# Patient Record
Sex: Male | Born: 1950 | Race: White | Hispanic: No | Marital: Married | Smoking: Former smoker
Health system: Southern US, Community
[De-identification: ages and names within clinical notes are randomized; demographics above are authoritative.]

## PROBLEM LIST (undated history)

## (undated) DIAGNOSIS — R12 Heartburn: Secondary | ICD-10-CM

## (undated) DIAGNOSIS — G709 Myoneural disorder, unspecified: Secondary | ICD-10-CM

## (undated) DIAGNOSIS — H539 Unspecified visual disturbance: Secondary | ICD-10-CM

## (undated) DIAGNOSIS — Z87442 Personal history of urinary calculi: Secondary | ICD-10-CM

## (undated) DIAGNOSIS — H269 Unspecified cataract: Secondary | ICD-10-CM

## (undated) DIAGNOSIS — E119 Type 2 diabetes mellitus without complications: Secondary | ICD-10-CM

## (undated) DIAGNOSIS — E7800 Pure hypercholesterolemia, unspecified: Secondary | ICD-10-CM

## (undated) DIAGNOSIS — K219 Gastro-esophageal reflux disease without esophagitis: Secondary | ICD-10-CM

## (undated) DIAGNOSIS — N201 Calculus of ureter: Secondary | ICD-10-CM

## (undated) DIAGNOSIS — I4510 Unspecified right bundle-branch block: Secondary | ICD-10-CM

## (undated) DIAGNOSIS — M2170 Unequal limb length (acquired), unspecified site: Secondary | ICD-10-CM

## (undated) DIAGNOSIS — I2510 Atherosclerotic heart disease of native coronary artery without angina pectoris: Secondary | ICD-10-CM

## (undated) DIAGNOSIS — N486 Induration penis plastica: Secondary | ICD-10-CM

## (undated) DIAGNOSIS — G259 Extrapyramidal and movement disorder, unspecified: Secondary | ICD-10-CM

## (undated) DIAGNOSIS — IMO0001 Reserved for inherently not codable concepts without codable children: Secondary | ICD-10-CM

## (undated) DIAGNOSIS — N210 Calculus in bladder: Secondary | ICD-10-CM

## (undated) DIAGNOSIS — N401 Enlarged prostate with lower urinary tract symptoms: Secondary | ICD-10-CM

## (undated) DIAGNOSIS — R351 Nocturia: Secondary | ICD-10-CM

## (undated) DIAGNOSIS — E785 Hyperlipidemia, unspecified: Secondary | ICD-10-CM

## (undated) DIAGNOSIS — I10 Essential (primary) hypertension: Secondary | ICD-10-CM

## (undated) DIAGNOSIS — N200 Calculus of kidney: Secondary | ICD-10-CM

## (undated) DIAGNOSIS — G35 Multiple sclerosis: Secondary | ICD-10-CM

## (undated) DIAGNOSIS — R011 Cardiac murmur, unspecified: Secondary | ICD-10-CM

## (undated) DIAGNOSIS — Z8719 Personal history of other diseases of the digestive system: Secondary | ICD-10-CM

## (undated) DIAGNOSIS — Z86010 Personal history of colonic polyps: Secondary | ICD-10-CM

## (undated) DIAGNOSIS — L719 Rosacea, unspecified: Secondary | ICD-10-CM

## (undated) DIAGNOSIS — Z860101 Personal history of adenomatous and serrated colon polyps: Secondary | ICD-10-CM

## (undated) HISTORY — DX: Atherosclerotic heart disease of native coronary artery without angina pectoris: I25.10

## (undated) HISTORY — DX: Pure hypercholesterolemia, unspecified: E78.00

## (undated) HISTORY — DX: Extrapyramidal and movement disorder, unspecified: G25.9

## (undated) HISTORY — DX: Unspecified visual disturbance: H53.9

## (undated) HISTORY — DX: Gastro-esophageal reflux disease without esophagitis: K21.9

## (undated) HISTORY — DX: Rosacea, unspecified: L71.9

## (undated) HISTORY — DX: Heartburn: R12

## (undated) HISTORY — DX: Cardiac murmur, unspecified: R01.1

## (undated) HISTORY — DX: Induration penis plastica: N48.6

## (undated) HISTORY — DX: Hyperlipidemia, unspecified: E78.5

## (undated) HISTORY — DX: Unspecified cataract: H26.9

## (undated) HISTORY — DX: Unequal limb length (acquired), unspecified site: M21.70

## (undated) HISTORY — DX: Type 2 diabetes mellitus without complications: E11.9

## (undated) HISTORY — DX: Reserved for inherently not codable concepts without codable children: IMO0001

## (undated) HISTORY — DX: Benign prostatic hyperplasia with lower urinary tract symptoms: N40.1

## (undated) HISTORY — DX: Personal history of adenomatous and serrated colon polyps: Z86.0101

## (undated) HISTORY — PX: CARDIAC CATHETERIZATION: SHX172

## (undated) HISTORY — DX: Myoneural disorder, unspecified: G70.9

## (undated) HISTORY — DX: Essential (primary) hypertension: I10

## (undated) HISTORY — DX: Benign prostatic hyperplasia with lower urinary tract symptoms: R35.1

## (undated) HISTORY — DX: Multiple sclerosis: G35

## (undated) HISTORY — DX: Personal history of colonic polyps: Z86.010

---

## 2002-10-21 ENCOUNTER — Ambulatory Visit (HOSPITAL_COMMUNITY): Admission: RE | Admit: 2002-10-21 | Discharge: 2002-10-21 | Payer: Self-pay | Admitting: Gastroenterology

## 2002-10-21 ENCOUNTER — Encounter (INDEPENDENT_AMBULATORY_CARE_PROVIDER_SITE_OTHER): Payer: Self-pay | Admitting: Specialist

## 2004-10-17 ENCOUNTER — Ambulatory Visit (HOSPITAL_BASED_OUTPATIENT_CLINIC_OR_DEPARTMENT_OTHER): Admission: RE | Admit: 2004-10-17 | Discharge: 2004-10-17 | Payer: Self-pay | Admitting: Urology

## 2004-10-17 ENCOUNTER — Ambulatory Visit (HOSPITAL_COMMUNITY): Admission: RE | Admit: 2004-10-17 | Discharge: 2004-10-17 | Payer: Self-pay | Admitting: Urology

## 2004-10-17 HISTORY — PX: NESBIT PROCEDURE: SHX2087

## 2005-02-27 ENCOUNTER — Observation Stay (HOSPITAL_COMMUNITY): Admission: EM | Admit: 2005-02-27 | Discharge: 2005-02-28 | Payer: Self-pay | Admitting: Emergency Medicine

## 2007-07-18 DIAGNOSIS — Z87442 Personal history of urinary calculi: Secondary | ICD-10-CM

## 2007-07-18 HISTORY — DX: Personal history of urinary calculi: Z87.442

## 2007-10-02 ENCOUNTER — Encounter: Admission: RE | Admit: 2007-10-02 | Discharge: 2007-10-02 | Payer: Self-pay | Admitting: Family Medicine

## 2008-04-05 ENCOUNTER — Emergency Department (HOSPITAL_BASED_OUTPATIENT_CLINIC_OR_DEPARTMENT_OTHER): Admission: EM | Admit: 2008-04-05 | Discharge: 2008-04-05 | Payer: Self-pay | Admitting: Emergency Medicine

## 2008-04-09 ENCOUNTER — Ambulatory Visit (HOSPITAL_COMMUNITY): Admission: RE | Admit: 2008-04-09 | Discharge: 2008-04-09 | Payer: Self-pay | Admitting: Family Medicine

## 2009-02-25 ENCOUNTER — Observation Stay (HOSPITAL_COMMUNITY): Admission: EM | Admit: 2009-02-25 | Discharge: 2009-02-26 | Payer: Self-pay | Admitting: Emergency Medicine

## 2009-05-04 ENCOUNTER — Encounter: Admission: RE | Admit: 2009-05-04 | Discharge: 2009-05-04 | Payer: Self-pay | Admitting: Diagnostic Neuroimaging

## 2009-05-05 ENCOUNTER — Encounter: Admission: RE | Admit: 2009-05-05 | Discharge: 2009-07-14 | Payer: Self-pay | Admitting: Diagnostic Neuroimaging

## 2009-05-15 ENCOUNTER — Encounter: Admission: RE | Admit: 2009-05-15 | Discharge: 2009-05-15 | Payer: Self-pay | Admitting: Diagnostic Neuroimaging

## 2009-10-01 DIAGNOSIS — E1169 Type 2 diabetes mellitus with other specified complication: Secondary | ICD-10-CM

## 2009-10-01 DIAGNOSIS — E119 Type 2 diabetes mellitus without complications: Secondary | ICD-10-CM

## 2009-10-01 DIAGNOSIS — E785 Hyperlipidemia, unspecified: Secondary | ICD-10-CM

## 2009-10-01 DIAGNOSIS — I10 Essential (primary) hypertension: Secondary | ICD-10-CM

## 2009-10-01 DIAGNOSIS — E1165 Type 2 diabetes mellitus with hyperglycemia: Secondary | ICD-10-CM | POA: Insufficient documentation

## 2009-10-06 ENCOUNTER — Ambulatory Visit: Payer: Self-pay | Admitting: Cardiology

## 2009-10-06 DIAGNOSIS — I2510 Atherosclerotic heart disease of native coronary artery without angina pectoris: Secondary | ICD-10-CM | POA: Insufficient documentation

## 2009-10-06 DIAGNOSIS — I25118 Atherosclerotic heart disease of native coronary artery with other forms of angina pectoris: Secondary | ICD-10-CM | POA: Insufficient documentation

## 2010-04-06 ENCOUNTER — Ambulatory Visit: Payer: Self-pay | Admitting: Diagnostic Radiology

## 2010-04-06 ENCOUNTER — Encounter: Payer: Self-pay | Admitting: Cardiology

## 2010-04-06 ENCOUNTER — Emergency Department (HOSPITAL_BASED_OUTPATIENT_CLINIC_OR_DEPARTMENT_OTHER): Admission: EM | Admit: 2010-04-06 | Discharge: 2010-04-06 | Payer: Self-pay | Admitting: Emergency Medicine

## 2010-08-06 ENCOUNTER — Encounter: Payer: Self-pay | Admitting: Urology

## 2010-08-18 NOTE — Assessment & Plan Note (Signed)
Summary: [facility]   Visit Type:  Follow-up Primary Provider:  Dr. Vernon Prey  CC:  CAD.  History of Present Illness: The patient presents as a new patient. He has a history of multiple cardiovascular risk factors. He also has an abnormal EKG with right bundle branch block. He has had 2 catheterizations in the past. I don't see these reports but do see the results mentioned in one note. In 2010 August he had 30% LAD stenosis and 50% distal right coronary stenosis diagnosed.  He presents now changing cardiovascular groups. Most recently he has been diagnosed with multiple sclerosis. This limits him slightly. 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 With this activity he denies chest pressure, neck or arm discomfort. He has no palpitations, presyncope or syncope. He has no PND or orthopnea.  Current Medications (verified): 1)  Metformin Hcl 500 Mg Tabs (Metformin Hcl) .... 2 By Mouth Two Times A Day 2)  Actos 30 Mg Tabs (Pioglitazone Hcl) .Marland Kitchen.. 1 By Mouth Daily 3)  Isosorbide Mononitrate Cr 30 Mg Xr24h-Tab (Isosorbide Mononitrate) .Marland Kitchen.. 1 By Mouth Daily 4)  Amlodipine Besylate 2.5 Mg Tabs (Amlodipine Besylate) .Marland Kitchen.. 1 Podaily 5)  Lovaza 1 Gm Caps (Omega-3-Acid Ethyl Esters) .... 2 By Mouth Two Times A Day 6)  Lisinopril 10 Mg Tabs (Lisinopril) .Marland Kitchen.. 1 By Mouth Daily 7)  Crestor 5 Mg Tabs (Rosuvastatin Calcium) .Marland Kitchen.. 1 By Mouth Daily 8)  Prevacid 15 Mg Cpdr (Lansoprazole) .Marland Kitchen.. 1 By Mouth Daily 9)  Vitamin D3 1000 Unit Caps (Cholecalciferol) .Marland Kitchen.. 1 By Mouth Daily 10)  Aspirin 81 Mg  Tabs (Aspirin) .Marland Kitchen.. 1 By Mouth Daily 11)  Betaseron 0.3 Mg Solr (Interferon Beta-1b) .... Every Other Day  Allergies (verified): No Known Drug Allergies  Past History:  Past Medical History: Hypertension.   Non-insulin-dependent diabetes.  Dyslipidemia  Gastroesophageal reflux symptoms.  Multiple sclerosis      Past Surgical History: Nesbit tuck correction of penile angulation.  Review of  Systems       Back pain. Otherwise as stated in the history of present illness negative for all other systems.  Vital Signs:  Patient profile:   60 year old male Height:      66 inches Weight:      161 pounds BMI:     26.08 Pulse rate:   68 / minute Resp:     16 per minute BP sitting:   120 / 82  (right arm)  Vitals Entered By: Marrion Coy, CNA (October 06, 2009 3:00 PM)  Physical Exam  General:  Well developed, well nourished, in no acute distress. Head:  normocephalic and atraumatic Eyes:  PERRLA/EOM intact; conjunctiva and lids normal. Mouth:  Teeth, gums and palate normal. Oral mucosa normal. Neck:  Neck supple, no JVD. No masses, thyromegaly or abnormal cervical nodes. Chest Wall:  no deformities or breast masses noted Lungs:  Clear bilaterally to auscultation and percussion. Abdomen:  Bowel sounds positive; abdomen soft and non-tender without masses, organomegaly, or hernias noted. No hepatosplenomegaly. Msk:  Back normal, normal gait. Muscle strength and tone normal. Extremities:  No clubbing or cyanosis. Neurologic:  Alert and oriented x 3. Skin:  Intact without lesions or rashes. Cervical Nodes:  no significant adenopathy Axillary Nodes:  no significant adenopathy Inguinal Nodes:  no significant adenopathy Psych:  Normal affect.   Detailed Cardiovascular Exam  Neck    Carotids: Carotids full and equal bilaterally without bruits.      Neck Veins: Normal, no JVD.  Heart    Inspection: no deformities or lifts noted.      Palpation: normal PMI with no thrills palpable.      Auscultation: regular rate and rhythm, S1, S2 without murmurs, rubs, gallops, or clicks.    Vascular    Abdominal Aorta: no palpable masses, pulsations, or audible bruits.      Femoral Pulses: normal femoral pulses bilaterally.      Pedal Pulses: normal pedal pulses bilaterally.      Radial Pulses: normal radial pulses bilaterally.      Peripheral Circulation: no clubbing, cyanosis, or  edema noted with normal capillary refill.     EKG  Procedure date:  10/06/2009  Findings:      sinus rhythm, rate 68, right bundle branch block, no acute ST-T wave changes  Impression & Recommendations:  Problem # 1:  CORONARY ATHEROSCLEROSIS, NATIVE VESSEL (ICD-414.01) The patient has nonobstructive disease and no acute symptoms. At this time he will continue to participate in aggressive risk reduction.  I do plan on seeing him again probably in about 24 months or sooner if he has any symptoms. I would do periodic treadmill testing given his risk factors. Orders: EKG w/ Interpretation (93000)  Problem # 2:  DM (ICD-250.00) I reviewed his labs. His most recent hemoglobin A1c in February was 6.1. This will be followed by Dr. Christell Constant.  Problem # 3:  HYPERTENSION (ICD-401.9) His blood pressure is well controlled and he will continue the meds as listed.  Problem # 4:  DYSLIPIDEMIA (ICD-272.4)  His last lipid profile demonstrated LDL 82, HDL 37. He was to increase his Crestor and this is being followed closely by Dr.Moore  Patient Instructions: 1)  Your physician recommends that you schedule a follow-up appointment in: 24 months with Dr Antoine Poche in [place] 2)  Your physician recommends that you continue on your current medications as directed. Please refer to the Current Medication list given to you today.

## 2010-08-25 ENCOUNTER — Telehealth (INDEPENDENT_AMBULATORY_CARE_PROVIDER_SITE_OTHER): Payer: Self-pay | Admitting: *Deleted

## 2010-08-30 ENCOUNTER — Ambulatory Visit (HOSPITAL_COMMUNITY): Admission: RE | Admit: 2010-08-30 | Payer: 59 | Source: Ambulatory Visit

## 2010-09-01 NOTE — Progress Notes (Signed)
  12 lead faxed to Karen/WL @ [phone] [facility]  August 25, 2010 3:07 PM

## 2010-09-05 ENCOUNTER — Ambulatory Visit (HOSPITAL_COMMUNITY): Payer: 59

## 2010-09-05 ENCOUNTER — Ambulatory Visit (HOSPITAL_COMMUNITY)
Admission: RE | Admit: 2010-09-05 | Discharge: 2010-09-05 | Disposition: A | Discharge status: Home / Self Care | Payer: 59 | Source: Ambulatory Visit | Attending: Urology | Admitting: Urology

## 2010-09-05 DIAGNOSIS — E119 Type 2 diabetes mellitus without complications: Secondary | ICD-10-CM | POA: Insufficient documentation

## 2010-09-05 DIAGNOSIS — I10 Essential (primary) hypertension: Secondary | ICD-10-CM | POA: Insufficient documentation

## 2010-09-05 DIAGNOSIS — Z7982 Long term (current) use of aspirin: Secondary | ICD-10-CM | POA: Insufficient documentation

## 2010-09-05 DIAGNOSIS — G35 Multiple sclerosis: Secondary | ICD-10-CM | POA: Insufficient documentation

## 2010-09-05 DIAGNOSIS — K449 Diaphragmatic hernia without obstruction or gangrene: Secondary | ICD-10-CM | POA: Insufficient documentation

## 2010-09-05 DIAGNOSIS — N200 Calculus of kidney: Secondary | ICD-10-CM | POA: Insufficient documentation

## 2010-09-05 DIAGNOSIS — Z79899 Other long term (current) drug therapy: Secondary | ICD-10-CM | POA: Insufficient documentation

## 2010-09-05 HISTORY — PX: EXTRACORPOREAL SHOCK WAVE LITHOTRIPSY: SHX1557

## 2010-09-05 LAB — GLUCOSE, CAPILLARY
Glucose-Capillary: 118 mg/dL — ABNORMAL HIGH (ref 70–99)
Glucose-Capillary: 86 mg/dL (ref 70–99)

## 2010-09-29 LAB — URINALYSIS, ROUTINE W REFLEX MICROSCOPIC
Hgb urine dipstick: NEGATIVE
Leukocytes, UA: NEGATIVE
Protein, ur: 30 mg/dL — AB
Specific Gravity, Urine: 1.026 (ref 1.005–1.030)
pH: 5 (ref 5.0–8.0)

## 2010-09-29 LAB — COMPREHENSIVE METABOLIC PANEL
CO2: 24 mEq/L (ref 19–32)
GFR calc Af Amer: >60 mL/min (ref >60)
Potassium: 3.7 mEq/L (ref 3.5–5.1)
Sodium: 139 mEq/L (ref 135–145)
Total Bilirubin: 0.9 mg/dL (ref 0.3–1.2)
Total Protein: 7.5 g/dL (ref 6.0–8.3)

## 2010-09-29 LAB — URINE CULTURE
Culture  Setup Time: 201109220040
Culture: NO GROWTH

## 2010-09-29 LAB — DIFFERENTIAL
Eosinophils Relative: 0 % (ref 0–5)
Lymphocytes Relative: 4 % — ABNORMAL LOW (ref 12–46)
Lymphs Abs: 0.3 10*3/uL — ABNORMAL LOW (ref 0.7–4.0)
Monocytes Absolute: 0.9 10*3/uL (ref 0.1–1.0)
Monocytes Relative: 10 % (ref 3–12)
Neutro Abs: 7.7 10*3/uL (ref 1.7–7.7)
Neutrophils Relative %: 86 % — ABNORMAL HIGH (ref 43–77)

## 2010-09-29 LAB — CBC
Hemoglobin: 12.3 g/dL — ABNORMAL LOW (ref 13.0–17.0)
MCHC: 34.5 g/dL (ref 30.0–36.0)
MCV: 85 fL (ref 78.0–100.0)
WBC: 8.9 10*3/uL (ref 4.0–10.5)

## 2010-09-29 LAB — LIPASE, BLOOD: Lipase: 60 U/L (ref 23–300)

## 2010-09-29 LAB — URINE MICROSCOPIC-ADD ON

## 2010-10-22 LAB — TROPONIN I
Troponin I: 0.01 ng/mL (ref 0.00–0.06)
Troponin I: 0.01 ng/mL (ref 0.00–0.06)
Troponin I: <0.01 ng/mL (ref 0.00–0.06)

## 2010-10-22 LAB — DIFFERENTIAL
Basophils Relative: 0 % (ref 0–1)
Eosinophils Absolute: 0.1 10*3/uL (ref 0.0–0.7)
Eosinophils Relative: 1 % (ref 0–5)
Lymphocytes Relative: 20 % (ref 12–46)
Monocytes Absolute: 0.6 10*3/uL (ref 0.1–1.0)
Monocytes Relative: 8 % (ref 3–12)
Neutro Abs: 5.2 10*3/uL (ref 1.7–7.7)
Neutrophils Relative %: 71 % (ref 43–77)

## 2010-10-22 LAB — COMPREHENSIVE METABOLIC PANEL
ALT: 17 U/L (ref 0–53)
Calcium: 9.4 mg/dL (ref 8.4–10.5)
Creatinine, Ser: 0.86 mg/dL (ref 0.4–1.5)
GFR calc non Af Amer: >60 mL/min (ref >60)
Total Bilirubin: 0.8 mg/dL (ref 0.3–1.2)

## 2010-10-22 LAB — CBC
HCT: 38 % — ABNORMAL LOW (ref 39.0–52.0)
HCT: 38.9 % — ABNORMAL LOW (ref 39.0–52.0)
MCHC: 34.8 g/dL (ref 30.0–36.0)
MCV: 87.3 fL (ref 78.0–100.0)
MCV: 87.9 fL (ref 78.0–100.0)
Platelets: 193 10*3/uL (ref 150–400)
Platelets: 202 10*3/uL (ref 150–400)
RBC: 4.42 MIL/uL (ref 4.22–5.81)
RDW: 14.7 % (ref 11.5–15.5)
WBC: 7.3 10*3/uL (ref 4.0–10.5)

## 2010-10-22 LAB — APTT: aPTT: 30 seconds (ref 24–37)

## 2010-10-22 LAB — CK TOTAL AND CKMB (NOT AT ARMC)
CK, MB: 3.1 ng/mL (ref 0.3–4.0)
Relative Index: 3.3 — ABNORMAL HIGH (ref 0.0–2.5)
Relative Index: INVALID (ref 0.0–2.5)
Total CK: 112 U/L (ref 7–232)
Total CK: 84 U/L (ref 7–232)

## 2010-10-22 LAB — BASIC METABOLIC PANEL
Calcium: 8.8 mg/dL (ref 8.4–10.5)
Creatinine, Ser: 1.01 mg/dL (ref 0.4–1.5)
GFR calc Af Amer: >60 mL/min (ref >60)
Sodium: 135 mEq/L (ref 135–145)

## 2010-10-22 LAB — PROTIME-INR
INR: 1 (ref 0.00–1.49)
Prothrombin Time: 13.1 seconds (ref 11.6–15.2)

## 2010-10-22 LAB — LIPID PANEL: VLDL: 22 mg/dL (ref 0–40)

## 2010-10-22 LAB — BRAIN NATRIURETIC PEPTIDE: Pro B Natriuretic peptide (BNP): <30 pg/mL (ref 0.0–100.0)

## 2010-10-22 LAB — MAGNESIUM: Magnesium: 2.3 mg/dL (ref 1.5–2.5)

## 2010-10-22 LAB — HEMOGLOBIN A1C: Mean Plasma Glucose: 151 mg/dL

## 2010-10-22 LAB — HEPARIN LEVEL (UNFRACTIONATED): Heparin Unfractionated: 0.41 IU/mL (ref 0.30–0.70)

## 2010-11-15 ENCOUNTER — Encounter: Payer: Self-pay | Admitting: Family Medicine

## 2010-11-29 NOTE — Cardiovascular Report (Signed)
NAME:  Jacob Fowler, MCGRANAHAN NO.:  [id number]   MEDICAL RECORD NO.:  [id number]          PATIENT TYPE:  INP   LOCATION:  2036                         FACILITY:  [facility]   PHYSICIAN:  Antonieta Iba, MD   DATE OF BIRTH:  08/01/1950   DATE OF PROCEDURE:  DATE OF DISCHARGE:  02/26/2009                            CARDIAC CATHETERIZATION   Cardiac Catheterization   PHYSICIAN PERFORMING THE PROCEDURE:  Antonieta Iba, MD   IDENTIFICATION:  Mr. Crate is a very pleasant 60 year old gentleman  with a history of mild coronary artery disease noted on catheterization  in August 2006 who presents with chest pain with exertion.  He also has  a history of hyperlipidemia, diabetes, intolerance to high dose statins  and treated hypertension.  He presents to the Cardiac Catheterization  Lab for further evaluation.   The risks and the benefits of the procedure were discussed with the  patient and consent was obtained.  He was brought to the Cardiac  Catheterization Lab and prepped and draped in the usual sterile fashion.  The modified Seldinger technique was used to engage the right femoral  artery and a 5-French introducer sheath was placed.  A 5-French JL-4 and  a JR-4 catheter were used to engage the left main and the ostium of the  RCA respectively.  Hand injection contrast with cinematography recorded  the coronary anatomy.  A 5-French pigtail catheter was used to cross the  aortic valve and LV gram was recorded.  At the end of the case, the  catheters were removed and manual pressure held and hemostasis obtained.  No complications were reported at the time of this dictation.   Coronary anatomy;  1. Left main; left main is a moderate-to-large size vessel that      bifurcates into the LAD and the left circumflex.  There is no      significant disease noted.   1. Left anterior descending; the LAD is a moderate-to-large size      vessel that tapers mildly from the mid LAD  distally to the apex.      There is a 30% to 40% lesion noted in the mid LAD that appears to      be eccentric near the D2 take off.  There is also mild diffuse      disease from the mid to distal LAD estimated at 20%.   1. Left circumflex; left circumflex is a moderate-sized vessel.  There      is one large obtuse marginal branch.  There is 20% disease noted in      the mid OM 1.  Otherwise, no disease noted.   1. Right coronary artery; right dominant coronary arterial system with      moderate distal RCA disease noted prior to the bifurcation of the      PD and PL branches.  Other small regions of mild diffuse disease.   LV gram showing normal systolic function with no focal wall motion  abnormalities.   In summary; right dominant coronary arterial system with moderate  disease of the distal RCA, mild to moderate disease  of the mid-LAD and  mild diffuse disease of the distal LAD and a region in the mid OM.  Medical management will be recommended.  He will be discharged today if  there are no complications.      Antonieta Iba, MD  Electronically Signed     TJG/MEDQ  D:  02/26/2009  T:  02/27/2009  Job:  [id number]

## 2010-11-29 NOTE — Discharge Summary (Signed)
NAME:  Jacob Fowler, Jacob Fowler NO.:  [id number]   MEDICAL RECORD NO.:  [id number]          PATIENT TYPE:  INP   LOCATION:  2036                         FACILITY:  [facility]   PHYSICIAN:  Nanetta Batty, M.D.   DATE OF BIRTH:  May 17, 1951   DATE OF ADMISSION:  02/25/2009  DATE OF DISCHARGE:  02/26/2009                               DISCHARGE SUMMARY   DISCHARGE DIAGNOSES:  1. Chest pain worrisome for unstable angina, catheterization this      admission showing mild coronary disease with a 50% distal right      coronary artery.  2. Normal left ventricular function.  3. Reported history of coronary spasm in 2004.  4. Treated hypertension.  5. Treated dyslipidemia.  6. Type 2 non-insulin-dependent diabetes.  7. Family history of coronary disease.  8. Right bundle-branch block which is chronic.   HOSPITAL COURSE:  The patient is a 60 year old male followed by Dr.  Christell Constant, he had been seen by Dr. Elsie Lincoln in the past.  In a catheterization  in 2004, it was felt that he had coronary spasm, I do not have the  details of that cath report.  He was restudied again in August 2006 at  the [facility] and had mild disease, but he did have an anterior wall  motion abnormality with normal LV function.  His last stress test was in  May 2009 and was negative for significant ischemia with an EF of 60%.  He saw Dr. Elsie Lincoln in April 2010 and was doing well.  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  He had mentioned to Dr. Christell Constant in the  office on February 25, 2009, that he had also been having some chest pain.  Because of his multiple risk factors, Dr. Christell Constant sent him over to [facility]  for further evaluation.  He was seen by Dr. Allyson Sabal and myself in the  emergency room and admitted for diagnostic catheterization.  We put him  on IV heparin and nitroglycerin.  Enzymes were negative.  Catheterization done by Dr. Julien Nordmann on  February 26, 2009, shows  normal left main, 30% LAD 20% distal circumflex, and 50% distal RCA with  an EF of greater than 55%.  Plan is for continued medical management.  We feel he can be discharged later on February 26, 2009, after his bedrest  is up and he has ambulated without problems.   LABORATORY DATA:  Sodium 135, potassium 3.5, BUN 18, creatinine 1.0.  Troponins are negative.  Triglycerides 108, total cholesterol 153, LDL  109, HDL 22.  White count 6.6, hemoglobin 13.2, hematocrit 38, platelets  193.  Hemoglobin A1c is 6.9.  EKG shows sinus rhythm, sinus bradycardia  with right bundle-branch block.   DISPOSITION:  The patient is discharged in stable condition.   DISCHARGE MEDICATIONS:  1. Norvasc 2.5 mg a day.  2. Simcor 500/20 daily.  3. Aspirin 81 mg a day.  4. Lisinopril 10 mg a day.  5. Metformin 1 g b.i.d. will be held until March 01, 2009.  6. Prilosec 20 mg a day.  7. Lovaza 2 g b.i.d.  8. Actos 30 mg a day.   DISPOSITION:  The patient is discharged in stable condition.  He will  follow up with Dr. Christell Constant and Dr. Allyson Sabal.      Abelino Derrick, P.A.      Nanetta Batty, M.D.  Electronically Signed    LKK/MEDQ  D:  02/26/2009  T:  02/27/2009  Job:  [id number]   cc:   Ernestina Penna, M.D.

## 2010-11-29 NOTE — H&P (Signed)
NAME:  Jacob Fowler, Jacob Fowler NO.:  [id number]   MEDICAL RECORD NO.:  [id number]          PATIENT TYPE:  EMS   LOCATION:  MAJO                         FACILITY:  [facility]   PHYSICIAN:  Nanetta Batty, M.D.   DATE OF BIRTH:  07/20/1950   DATE OF ADMISSION:  02/25/2009  DATE OF DISCHARGE:                              HISTORY & PHYSICAL   CHIEF COMPLAINT:  Chest pain.   HISTORY OF PRESENT ILLNESS:  The patient is a 60 year old male who has  been followed by Dr. Elsie Lincoln in the past.  He had a catheterization in  2004 and was felt to have had coronary spasm.  He was studied again in  August 2006 at the [facility].  At that time he had a 40% mid LAD,  normal circumflex and normal RCA with an EF of 50%.  His last stress  test was in May 2009 and was negative for ischemia with an EF of 66%.  He last saw Dr. Elsie Lincoln in April 2010 and was doing well from a cardiac  standpoint.  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  He was at Dr. Roe Coombs Moore's office today.  Dr. Christell Constant also  elicited a history of recent chest pain over the last couple of days.  He said yesterday he had a spell the lasted 2 hours with some radiation  down his arm.  He did not take nitroglycerin.  There was no associated  nausea, vomiting, diaphoresis, or shortness of breath.  He continues to  have some low level, 2-3/10 chest pain in Dr. Kathi Der office and saw Dr.  Christell Constant sent him to the emergency room.  He denies any unusual dyspnea on  exertion.  He is admitted now for further evaluation.   PAST MEDICAL HISTORY:  Remarkable for treated hypertension.  He has type  2 non-insulin-dependent diabetes.  He has a history of dyslipidemia and  has been intolerant to statins in the past.  He has some  gastroesophageal reflux symptoms.   CURRENT MEDICATIONS:  1. Norvasc 2.5 mg a day.  2. Simcor 500/20 daily.  3. Aspirin 81 mg  a day.  4. Lisinopril 10 mg a day.  5. Metformin 1 gram b.i.d.  6. Actos 30 mg a day.  7. Lovaza 2 grams b.i.d.  8. Prilosec 20 mg a day.   ALLERGIES:  He has no known drug allergies.   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  He is married.  He has 2 children.  He quit smoking when he was in high school.   FAMILY HISTORY:  Remarkable in that he has a brother who had a heart  attack at 48 years old.  His mother also had coronary disease.   REVIEW OF SYSTEMS:  Essentially unremarkable except as noted above.  He  denies any GI bleeding or melena.  He has had a history of kidney  stones.  He has not had any prostate trouble.  Denies any recent fever  or productive cough.   PHYSICAL EXAMINATION:  VITAL SIGNS:  Blood pressure 116/80, pulse 56,  respirations 12.  GENERAL:  He is a well-developed, well-nourished male in no acute  distress.  HEENT:  Normocephalic.  Extraocular movements are intact.  Sclerae is  anicteric.  Lids and conjunctivae are within normal limits.  He does  wear glasses.  NECK:  Without JVD or bruits.  CHEST:  Clear to auscultation and percussion.  CARDIAC:  Reveals regular rate and rhythm without murmur or gallop.  Normal S1 and S2.  ABDOMEN:  Nontender.  No hepatosplenomegaly.  Bowel sounds are present.  EXTREMITIES:  Without edema.  Distal pulses are 3+/4 bilaterally.  NEURO:  Exam grossly intact.  He is awake, alert, oriented, cooperative.  Moves all extremities without obvious deficit.  SKIN:  Cool and dry.   EKG shows sinus rhythm with a right bundle branch block which is old.   IMPRESSION:  1. Chest pain, normal coronaries with spasm in 2004, mild coronary      disease in 2006 with a 40% left anterior descending artery,      negative Myoview May 2009.  2. Type 2 non-insulin-dependent diabetes.  3. Treated hypertension.  4. Treated dyslipidemia with a history of intolerance to higher-dose      statins.  5. Strong family history of  coronary disease.  6. Right bundle branch block which is chronic.   PLAN:  The patient is seen by Dr. Allyson Sabal in the emergency room.  He will  be admitted to telemetry.  We will start him on IV heparin and nitrates  and plan restudy probably tomorrow.      Abelino Derrick, P.A.      Nanetta Batty, M.D.  Electronically Signed    LKK/MEDQ  D:  02/25/2009  T:  02/25/2009  Job:  [id number]   cc:   Nanetta Batty, M.D.  Ernestina Penna, M.D.

## 2010-12-02 NOTE — Op Note (Signed)
NAME:  Jacob Fowler, Jacob Fowler            ACCOUNT NO.:  [id number]   MEDICAL RECORD NO.:  [id number]          PATIENT TYPE:  AMB   LOCATION:  NESC                         [facility]ACILITY[facility]  [facility]   PHYSICIAN:  Mark C. Vernie Ammons, M.D.  DATE OF BIRTH:  1950/12/09   DATE OF PROCEDURE:  10/17/2004  DATE OF DISCHARGE:                                 OPERATIVE REPORT   PREOPERATIVE DIAGNOSIS:  Peyronie's disease.   POSTOPERATIVE DIAGNOSES:  Peyronie's disease.   PROCEDURE:  Nesbit tuck correction of penile angulation.   SURGEON:  Mark C. Vernie Ammons, M.D.   ANESTHESIA:  General.   BLOOD LOSS:  Less than 5 cc.   SPECIMENS:  None.   COMPLICATIONS:  None.   INDICATIONS:  The patient is 60 year old white male with a Peyronie's  disease with a penile curvature.  The condition has remained stable now and  he has elected for surgical correction. We discussed the treatment options  and he has elected to proceed with Nesbitt tuck understanding the risks,  complications and alternatives.   DESCRIPTION OF OPERATION:  After informed consent, the patient was brought  to the major OR, placed on the table and administered general anesthesia.  The genitalia were sterilely prepped and draped. The foreskin was retracted  and circumcising incision was then made circumferentially approximately 3 mm  from the coronal sulcus. The penile shaft skin was then dissected back down  to the base off of the Buck's fascia. A Penrose drain was then placed around  the base of penis to broke to form a penile tourniquet and sterile  injectable saline was then injected into the corpora to perform an  artificial erection. This revealed curvature of the penis dorsally and to  the left. I then marked the area of greatest angulation and the area  contralateral to that with a marking pen. The erection was released and I  placed Allis clamps in the location of the planned Nesbitt tucks and  repeated the artificial erection. This resulted  in good straightening of the  penis with minimal no foreshortening identifiable. I therefore released the  tourniquet again, made a longitudinal incision at the location of the Allis  clamp and then closed him in a horizontal fashion with interrupted 3-0  braided nylon suture. Three of these incisions and closures were made by  incising the corpora just to the level of the spongiosal tissue but not into  this tissue. I then closed each of these in a watertight fashion. The  tourniquet was then reapplied and artificial erection and now showed that  there was near complete straightening of the penis. There was some wasting  of the penis prior to the tuck procedure. This area of wasting was not  increased by procedure. I then closed Buck's fascia over the area where the  corporotomies were made and made sure all bleeding points were cauterized. I  then closed the frenulum in the midline with interrupted 4-0 chromic suture.  The shaft skin was then reapproximated to the proximal penile skin  circumferentially with 3-0 chromic suture. Neosporin was then applied to the  incision and because  he was uncircumcised I repositioned the foreskin over  the incision rather than apply a dressing to prevent paraphimosis.   A dorsal penile block was performed using 10 cc of 0.25% plain Marcaine in  the standard fashion. The patient was awakened and taken to recovery in  stable satisfactory condition. He tolerated procedure well with no  intraoperative complications. He will be given a prescription for 48 Tylox  and Keflex 500 milligrams to be taken b.i.d. for 10 days and will return to  my office for follow-up in two weeks.      MCO/MEDQ  D:  10/17/2004  T:  10/17/2004  Job:  [id number]

## 2010-12-02 NOTE — Op Note (Signed)
NAME:  Jacob Fowler, Jacob Fowler                      ACCOUNT NO.:  [id number]   MEDICAL RECORD NO.:  [id number]                   PATIENT TYPE:  AMB   LOCATION:  ENDO                                 [facility]ACILITY[facility]  [facility]   PHYSICIAN:  Petra Kuba, M.D.                 DATE OF BIRTH:  01/20/1951   DATE OF PROCEDURE:  10/21/2002  DATE OF DISCHARGE:                                 OPERATIVE REPORT   PROCEDURE:  Colonoscopy.   INDICATIONS FOR PROCEDURE:  Screening.   Consent was signed after risks, benefits, methods, and options were  thoroughly discussed in the office.   MEDICINES USED:  Demerol 50, Versed 5.   DESCRIPTION OF PROCEDURE:  Rectal inspection was pertinent for external  hemorrhoids, small. Digital exam was negative. The video colonoscope was  inserted, easily advanced around the colon to the cecum. This did require  some abdominal pressure but no position changes. On insertion, a small  proximal transverse polyp was seen but no other abnormalities. The cecum was  identified by the appendiceal orifice and the ileocecal valve.  The prep was  adequate. There was some liquid stool that required washing and suctioning.  On slow withdrawal through the colon, the cecum and the ascending were  normal. As the scope was withdrawn around the hepatic flexure, the polyp  seen on insertion was seen, snared, electrocautery applied, and the polyp  was suctioned through the scope and collected in the trap. Possibly there  was a patch of the residual polypoid tissue which was hot biopsied x1. The  scope was further withdrawn. In the mid transverse, a tiny polyp was seen  and was hot biopsied x1. The scope was further withdrawn. Other than another  tiny polyp in the distal sigmoid which was hot biopsied and put in the same  container, no other abnormalities were seen as we slowly withdrew back to  the rectum. Once back in the rectum, the scope was retroflexed pertinent for  some internal  hemorrhoids.  The scope was straightened and readvanced a  short ways up the left side of the colon, air was suctioned, scope removed.  The patient tolerated the procedure well. There was no obvious or immediate  complications.   ENDOSCOPIC DIAGNOSIS:  1. Internal and external small hemorrhoids.  2. Distal sigmoid tiny polyp hot biopsied.  3. Transverse two small polyps, one hot biopsied, the other snared and hot     biopsied at the base.  4. Otherwise within normal limits to the cecum.    PLAN:  Await pathology to determine future colonic screening. Continue  workup with an EGD for his upper tract symptoms.                                               Vernia Buff  E. Magod, M.D.    MEM/MEDQ  D:  10/21/2002  T:  10/21/2002  Job:  [id number]   cc:   Ernestina Penna, M.D.  [address] [place]  [place] [address]  Fax: [phone]

## 2010-12-02 NOTE — Discharge Summary (Signed)
NAME:  Jacob Fowler, Jacob Fowler NO.:  [id number]   MEDICAL RECORD NO.:  [id number]          PATIENT TYPE:  INP   LOCATION:  6524                         FACILITY:  [facility]   PHYSICIAN:  Nanetta Batty, M.D.   DATE OF BIRTH:  [birth year]   DATE OF ADMISSION:  02/27/2005  DATE OF DISCHARGE:  02/28/2005                                 DISCHARGE SUMMARY   [name] is a 60 year old white married male patient who has a history  of coronary spasm, with normal coronaries per cath in 2004.  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  He took a nitroglycerin and felt better.  He slept in his truck  and then was awakened with back pain between his scapula, lasted for a  while, sharp and stabbing, eventually resolved and then he developed chest  discomfort and left arm pain.  He repeated a nitroglycerin with relief and  went to his primary MD.  It was discussed with Dr. Allyson Sabal and he was brought  to [facility].  He was seen by Dr. Caprice Kluver, it was decided to  admit him, get CK-MBs and troponins, on the morning on February 28, 2005 he  was again seen by Dr. Clarene Duke, he was on IV heparin and CK-MB and troponins  were all negative.  His chest CT was negative.  His chest x-ray was  negative.  His EKG was within normal limits.  Again his CK-MB and troponins  were negative.  His hemoglobin was 12.9, his blood pressure 100-110/50,  heart rate 70, and O2 sats 98%.  It was decided he was stable for discharge  home.  His TSH was 5.497, his CK-MB and troponins negative, his hemoglobin  12.9, hematocrit 37.2, WBC 8.1, and his platelets were 156.  Sodium was 138,  potassium 4.2, his BUN was 14, creatinine 0.9, his glucose was 157.   DISCHARGE MEDICATIONS:  1.  Lisinopril 10 mg onetime per day.  2.  Metoprolol 500 mg two in the morning, two in the p.m.  3.  Niaspan  500 mg four at bedtime.  4.  Prilosec 20 mg one time per day.  5.  Enteric-coated aspirin 81 mg one time per day.  6.  Nitroglycerin p.r.n.  7.  Lovastatin 20 mg 1/2 every other day.  8.  Actos 30 mg one time per day.  9.  IMDUR 30 mg one time per day.   DISCHARGE DIAGNOSES:  1.  Chest pain, with negative CK-MB, negative EKG, negative chest CT.      Possibly related to coronary spasm which he has a history of.  2.  History of coronary spasm.  3.  History of normal coronary arteries.  4.  Hyperlipidemia.  5.  Diabetes mellitus.  6.  Hypertension.      Lezlie Octave, N.P.      Nanetta Batty, M.D.  Electronically Signed    BB/MEDQ  D:  02/28/2005  T:  02/28/2005  Job:  [id number]  cc:   Madaline Savage, M.D.  [address]  [place]  [place] [address]  Fax: [phone]

## 2010-12-02 NOTE — Op Note (Signed)
   NAME:  COAL, NEARHOOD                      ACCOUNT NO.:  [id number]   MEDICAL RECORD NO.:  [id number]                   PATIENT TYPE:  AMB   LOCATION:  ENDO                                 FACILITY:  [facility]   PHYSICIAN:  Petra Kuba, M.D.                 DATE OF BIRTH:  [birth year]   DATE OF PROCEDURE:  10/21/2002  DATE OF DISCHARGE:                                 OPERATIVE REPORT   PROCEDURE:  EGD.   INDICATION:  Upper tract symptoms.  Consent was signed prior to any  premedications given after risks, benefits, methods, options were thoroughly  discussed in the office.   MEDICINES USED ADDITIONALLY FOR THIS PROCEDURE:  None.   DESCRIPTION OF PROCEDURE:  The video endoscope was inserted by direct  vision.  The esophagus was normal.  In the distal esophagus was a small  hiatal hernia.  The scope passed into the stomach, advanced through a normal  antrum, normal pylorus, into a normal duodenal bulb and around the C-loop to  a normal second portion of the duodenum.  The scope was withdrawn back to  the bulb, and a good look there ruled out abnormalities in that location.  The scope was withdrawn back to the stomach, and retroflexed.  High in the  cardia, the hiatal hernia was confirmed.  The angularis, fundus, lesser and  greater curve were normal on retroflexed  visualization.  Straight  visualization of the stomach did not reveal any additional findings.  The  scope was removed after air was suctioned.  A good look at the hiatal hernia  pouch and esophagus were normal.  The scope was removed.  The patient  tolerated the procedure well.  There was no obvious immediate complication.   ENDOSCOPIC DIAGNOSES:  1. Small hiatal hernia.  2. Otherwise, normal EGD.   PLAN:  1. H2 blockers or pump inhibitors p.r.n.  2. Happy to see back p.r.n.  3. Otherwise, return care to Ernestina Penna, M.D. for the customary health     care maintenance to include yearly rectals and  guaiacs.                                               Petra Kuba, M.D.    MEM/MEDQ  D:  10/21/2002  T:  10/21/2002  Job:  [id number]   cc:   Ernestina Penna, M.D.  [address] [place]  [place] [address]  Fax: [phone]

## 2011-01-27 ENCOUNTER — Encounter: Payer: Self-pay | Admitting: Cardiology

## 2011-09-08 ENCOUNTER — Encounter: Payer: Self-pay | Admitting: Cardiology

## 2011-09-27 ENCOUNTER — Ambulatory Visit (INDEPENDENT_AMBULATORY_CARE_PROVIDER_SITE_OTHER): Payer: PRIVATE HEALTH INSURANCE | Admitting: Cardiology

## 2011-09-27 ENCOUNTER — Encounter: Payer: Self-pay | Admitting: Cardiology

## 2011-09-27 VITALS — BP 110/70 | HR 71 | Ht 66.0 in | Wt 154.0 lb

## 2011-09-27 DIAGNOSIS — F17200 Nicotine dependence, unspecified, uncomplicated: Secondary | ICD-10-CM

## 2011-09-27 DIAGNOSIS — Z720 Tobacco use: Secondary | ICD-10-CM | POA: Insufficient documentation

## 2011-09-27 DIAGNOSIS — I2510 Atherosclerotic heart disease of native coronary artery without angina pectoris: Secondary | ICD-10-CM

## 2011-09-27 DIAGNOSIS — I10 Essential (primary) hypertension: Secondary | ICD-10-CM

## 2011-09-27 DIAGNOSIS — E785 Hyperlipidemia, unspecified: Secondary | ICD-10-CM

## 2011-09-27 NOTE — Progress Notes (Signed)
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 He had his last catheterization in 2000 and was 50% LAD stenosis and 50% distal right coronary stenosis. Since that time and since his last appointment in 2011 he has had no new cardiovascular symptoms. He is limited in his activities by multiple sclerosis. He walks with a cane. He doesn't walk far because his legs get tired. However, with his activities of daily living he does not have chest pressure, neck or arm discomfort. He does not have palpitations presyncope or syncope.  He denies new SOB, PND or orthopnea.  He has no palpitations edema or weight gain.   Allergies  Allergen Reactions  . Crestor (Rosuvastatin Calcium)   . Niaspan (Niacin)   . Zocor (Simvastatin)     Current Outpatient Prescriptions  Medication Sig Dispense Refill  . amLODipine (NORVASC) 2.5 MG tablet Take 2.5 mg by mouth daily.        Marland Kitchen aspirin (LONGS ADULT LOW STRENGTH ASA) 81 MG EC tablet Take 81 mg by mouth daily.        . baclofen (LIORESAL) 10 MG tablet Take 10 mg by mouth 3 (three) times daily.      Marland Kitchen doxycycline (DORYX) 100 MG DR capsule Take 100 mg by mouth 2 (two) times daily.        . Ezetimibe (ZETIA PO) Take 5 mg by mouth daily.      . interferon beta-1b (BETASERON) 0.3 MG injection Inject 0.25 mg into the skin every other day.        . ISOSORBIDE PO Take 30 mg by mouth.        Marland Kitchen lisinopril (PRINIVIL,ZESTRIL) 10 MG tablet Take 10 mg by mouth daily.        . metFORMIN (GLUCOPHAGE) 500 MG tablet Take 500 mg by mouth 2 (two) times daily with a meal.        . Multiple Vitamin (MULTI-VITAMIN DAILY PO) Take by mouth.      . Omega-3-acid Ethyl Esters (LOVAZA PO) Take by mouth.        Marland Kitchen omeprazole (PRILOSEC) 20 MG capsule Take 20 mg by mouth daily.        Marland Kitchen oxybutynin (DITROPAN) 5 MG tablet Take 5 mg by mouth.      . pioglitazone (ACTOS) 30 MG tablet Take 30 mg by mouth daily.        . Tamsulosin HCl (FLOMAX) 0.4 MG CAPS Take by mouth.       . lansoprazole (PREVACID) 15 MG capsule Take 15 mg by mouth daily.          Past Medical History  Diagnosis Date  . NIDDM (non-insulin dependent diabetes mellitus)   . BPH associated with nocturia   . Hyperlipidemia   . Hypertension   . Hiatal hernia   . Double vision   . Acquired leg length discrepancy   . Hx of adenomatous colonic polyps   . Rosacea   . GERD (gastroesophageal reflux disease)   . CAD (coronary artery disease)     Non obstructive Cath 2010  . Nephrolithiasis 01/25/08  . Multiple sclerosis 10/10  . Peyronie's disease     Past Surgical History  Procedure Date  . None     ROS:  As stated in the HPI and negative for all other systems.  PHYSICAL EXAM BP 110/70  Pulse 71  Ht 5\' 6"  (1.676 m)  Wt 154 lb (69.854 kg)  BMI 24.86 kg/m2 GENERAL:  Well appearing  HEENT:  Pupils equal round and reactive, fundi not visualized NECK:  No jugular venous distention, waveform within normal limits, carotid upstroke brisk and symmetric, no bruits, no thyromegaly LUNGS:  Clear to auscultation bilaterally BACK:  No CVA tenderness CHEST:  Unremarkable HEART:  PMI not displaced or sustained,S1 and S2 within normal limits, no S3, no S4, no clicks, no rubs, soft apical systolic nonradiating early peaking and  murmur ABD:  Flat, positive bowel sounds normal in frequency in pitch, no bruits, no rebound, no guarding, no midline pulsatile mass, no hepatomegaly, no splenomegaly EXT:  2 plus pulses throughout, no edema, no cyanosis no clubbing SKIN:  No rashes no nodules NEURO:  Cranial nerves II through XII grossly intact, grossly strength reduced bilaterally in lower extremities  PSYCH:  Cognitively intact, oriented to person place and time  EKG: Sinus rhythm, rate 71, right bundle branch block, no acute ST-T wave changes.  No changes.  09/27/2011  ASSESSMENT AND PLAN

## 2011-09-27 NOTE — Assessment & Plan Note (Signed)
His most recent HDL was 36 with an LDL of 62. No change in therapy is indicated.

## 2011-09-27 NOTE — Assessment & Plan Note (Signed)
He does report smokeless tobacco use and we discussed the benefits of stopping this completely.

## 2011-09-27 NOTE — Assessment & Plan Note (Signed)
His blood pressure is well controlled. He will continue the medications as listed.

## 2011-09-27 NOTE — Patient Instructions (Signed)
The current medical regimen is effective;  continue present plan and medications.  Follow up in 2 years with Dr Hochrein.  You will receive a letter in the mail 2 months before you are due.  Please call us when you receive this letter to schedule your follow up appointment.  

## 2011-09-27 NOTE — Assessment & Plan Note (Signed)
The patient has had nonobstructive disease. Since his last catheterization there has been no change in symptoms. He is participating in risk reduction. No further testing is indicated at this point.

## 2011-10-18 ENCOUNTER — Other Ambulatory Visit: Payer: Self-pay | Admitting: Urology

## 2011-10-25 ENCOUNTER — Encounter (HOSPITAL_BASED_OUTPATIENT_CLINIC_OR_DEPARTMENT_OTHER): Payer: Self-pay | Admitting: *Deleted

## 2011-10-25 NOTE — Progress Notes (Signed)
NPO AFTER MN. ARRIVES AT 0945. NEEDS ISTAT AND KUB. CURRENT EKG W/ CHART AND IN EPIC. WILL TAKE NORVASC, ISOSORBIDE, AND PRILOSEC AM OF SURG W/ SIP OF WATER.  PT STATES HAS PASSED RIGHT URETERAL STONE BUT BLADDER STONE STILL THERE.

## 2011-10-30 ENCOUNTER — Ambulatory Visit (HOSPITAL_COMMUNITY): Payer: PRIVATE HEALTH INSURANCE

## 2011-10-30 ENCOUNTER — Encounter (HOSPITAL_BASED_OUTPATIENT_CLINIC_OR_DEPARTMENT_OTHER)
Admission: RE | Disposition: A | Discharge status: Home / Self Care | Payer: Self-pay | Source: Ambulatory Visit | Attending: Urology

## 2011-10-30 ENCOUNTER — Ambulatory Visit (HOSPITAL_BASED_OUTPATIENT_CLINIC_OR_DEPARTMENT_OTHER): Payer: PRIVATE HEALTH INSURANCE | Admitting: Anesthesiology

## 2011-10-30 ENCOUNTER — Encounter (HOSPITAL_BASED_OUTPATIENT_CLINIC_OR_DEPARTMENT_OTHER): Payer: Self-pay | Admitting: Anesthesiology

## 2011-10-30 ENCOUNTER — Ambulatory Visit (HOSPITAL_BASED_OUTPATIENT_CLINIC_OR_DEPARTMENT_OTHER)
Admission: RE | Admit: 2011-10-30 | Discharge: 2011-10-30 | Disposition: A | Discharge status: Home / Self Care | Payer: PRIVATE HEALTH INSURANCE | Source: Ambulatory Visit | Attending: Urology | Admitting: Urology

## 2011-10-30 ENCOUNTER — Encounter (HOSPITAL_BASED_OUTPATIENT_CLINIC_OR_DEPARTMENT_OTHER): Payer: Self-pay | Admitting: *Deleted

## 2011-10-30 DIAGNOSIS — N201 Calculus of ureter: Secondary | ICD-10-CM | POA: Insufficient documentation

## 2011-10-30 DIAGNOSIS — G35 Multiple sclerosis: Secondary | ICD-10-CM | POA: Insufficient documentation

## 2011-10-30 DIAGNOSIS — Z7982 Long term (current) use of aspirin: Secondary | ICD-10-CM | POA: Insufficient documentation

## 2011-10-30 DIAGNOSIS — I10 Essential (primary) hypertension: Secondary | ICD-10-CM | POA: Insufficient documentation

## 2011-10-30 DIAGNOSIS — E119 Type 2 diabetes mellitus without complications: Secondary | ICD-10-CM | POA: Insufficient documentation

## 2011-10-30 DIAGNOSIS — Z79899 Other long term (current) drug therapy: Secondary | ICD-10-CM | POA: Insufficient documentation

## 2011-10-30 DIAGNOSIS — N210 Calculus in bladder: Secondary | ICD-10-CM | POA: Insufficient documentation

## 2011-10-30 HISTORY — DX: Calculus of ureter: N20.1

## 2011-10-30 HISTORY — DX: Calculus in bladder: N21.0

## 2011-10-30 HISTORY — DX: Unspecified right bundle-branch block: I45.10

## 2011-10-30 HISTORY — PX: CYSTOSCOPY WITH LITHOLAPAXY: SHX1425

## 2011-10-30 HISTORY — DX: Calculus of kidney: N20.0

## 2011-10-30 HISTORY — DX: Personal history of urinary calculi: Z87.442

## 2011-10-30 HISTORY — PX: CYSTOSCOPY W/ RETROGRADES: SHX1426

## 2011-10-30 HISTORY — DX: Personal history of other diseases of the digestive system: Z87.19

## 2011-10-30 LAB — POCT I-STAT 4, (NA,K, GLUC, HGB,HCT)
Glucose, Bld: 112 mg/dL — ABNORMAL HIGH (ref 70–99)
HCT: 33 % — ABNORMAL LOW (ref 39.0–52.0)
Hemoglobin: 11.2 g/dL — ABNORMAL LOW (ref 13.0–17.0)
Potassium: 3.8 mEq/L (ref 3.5–5.1)
Sodium: 145 mEq/L (ref 135–145)

## 2011-10-30 LAB — GLUCOSE, CAPILLARY: Glucose-Capillary: 91 mg/dL (ref 70–99)

## 2011-10-30 SURGERY — CYSTOSCOPY, WITH BLADDER CALCULUS LITHOLAPAXY
Anesthesia: General | Site: Ureter | Laterality: Right | Wound class: Clean Contaminated

## 2011-10-30 MED ORDER — SODIUM CHLORIDE 0.9 % IR SOLN
Status: DC | PRN
  Administered 2011-10-30: 3000 mL

## 2011-10-30 MED ORDER — LACTATED RINGERS IV SOLN: INTRAVENOUS | Status: DC

## 2011-10-30 MED ORDER — PHENAZOPYRIDINE HCL 200 MG PO TABS: 200.0000 mg | ORAL_TABLET | Freq: Three times a day (TID) | ORAL | Status: AC | PRN

## 2011-10-30 MED ORDER — ONDANSETRON HCL 4 MG/2ML IJ SOLN
INTRAMUSCULAR | Status: DC | PRN
  Administered 2011-10-30: 4 mg via INTRAVENOUS

## 2011-10-30 MED ORDER — PROPOFOL 10 MG/ML IV EMUL
INTRAVENOUS | Status: DC | PRN
  Administered 2011-10-30: 150 mg via INTRAVENOUS

## 2011-10-30 MED ORDER — LIDOCAINE HCL (CARDIAC) 20 MG/ML IV SOLN
INTRAVENOUS | Status: DC | PRN
  Administered 2011-10-30: 60 mg via INTRAVENOUS

## 2011-10-30 MED ORDER — LACTATED RINGERS IV SOLN
INTRAVENOUS | Status: DC
  Administered 2011-10-30: 11:00:00 via INTRAVENOUS

## 2011-10-30 MED ORDER — HYDROCODONE-ACETAMINOPHEN 10-300 MG PO TABS: 1.0000 | ORAL_TABLET | Freq: Four times a day (QID) | ORAL | Status: DC | PRN

## 2011-10-30 MED ORDER — FENTANYL CITRATE 0.05 MG/ML IJ SOLN: 25.0000 ug | INTRAMUSCULAR | Status: DC | PRN

## 2011-10-30 MED ORDER — FENTANYL CITRATE 0.05 MG/ML IJ SOLN
INTRAMUSCULAR | Status: DC | PRN
  Administered 2011-10-30: 25 ug via INTRAVENOUS
  Administered 2011-10-30: 50 ug via INTRAVENOUS
  Administered 2011-10-30: 25 ug via INTRAVENOUS

## 2011-10-30 MED ORDER — PHENAZOPYRIDINE HCL 200 MG PO TABS
200.0000 mg | ORAL_TABLET | Freq: Once | ORAL | Status: AC
  Administered 2011-10-30: 200 mg via ORAL

## 2011-10-30 MED ORDER — IOHEXOL 350 MG/ML SOLN
INTRAVENOUS | Status: DC | PRN
  Administered 2011-10-30: 50 mL

## 2011-10-30 MED ORDER — CIPROFLOXACIN IN D5W 200 MG/100ML IV SOLN
200.0000 mg | INTRAVENOUS | Status: AC
  Administered 2011-10-30: 200 mg via INTRAVENOUS

## 2011-10-30 MED ORDER — MIDAZOLAM HCL 5 MG/5ML IJ SOLN
INTRAMUSCULAR | Status: DC | PRN
  Administered 2011-10-30: 2 mg via INTRAVENOUS

## 2011-10-30 SURGICAL SUPPLY — 37 items
ADAPTER CATH URET PLST 4-6FR (CATHETERS) ×2 IMPLANT
ADPR CATH URET STRL DISP 4-6FR (CATHETERS) ×3
BAG DRAIN URO-CYSTO SKYTR STRL (DRAIN) ×4 IMPLANT
BAG DRN UROCATH (DRAIN) ×3
BASKET LASER NITINOL 1.9FR (BASKET) IMPLANT
BASKET STNLS GEMINI 4WIRE 3FR (BASKET) IMPLANT
BASKET ZERO TIP NITINOL 2.4FR (BASKET) IMPLANT
BRUSH URET BIOPSY 3F (UROLOGICAL SUPPLIES) IMPLANT
BSKT STON RTRVL 120 1.9FR (BASKET)
BSKT STON RTRVL GEM 120X11 3FR (BASKET)
BSKT STON RTRVL ZERO TP 2.4FR (BASKET)
CANISTER SUCT LVC 12 LTR MEDI- (MISCELLANEOUS) ×2 IMPLANT
CATH INTERMIT  6FR 70CM (CATHETERS) ×2 IMPLANT
CATH URET 5FR 28IN CONE TIP (BALLOONS)
CATH URET 5FR 70CM CONE TIP (BALLOONS) IMPLANT
CLOTH BEACON ORANGE TIMEOUT ST (SAFETY) ×4 IMPLANT
DRAPE CAMERA CLOSED 9X96 (DRAPES) ×4 IMPLANT
EVACUATOR MICROVAS BLADDER (UROLOGICAL SUPPLIES) ×3 IMPLANT
GLOVE BIO SURGEON STRL SZ8 (GLOVE) ×4 IMPLANT
GLOVE ECLIPSE 6.5 STRL STRAW (GLOVE) ×2 IMPLANT
GLOVE INDICATOR 7.0 STRL GRN (GLOVE) ×2 IMPLANT
GOWN PREVENTION PLUS LG XLONG (DISPOSABLE) ×4 IMPLANT
GOWN STRL REIN XL XLG (GOWN DISPOSABLE) ×4 IMPLANT
GUIDEWIRE 0.038 PTFE COATED (WIRE) IMPLANT
GUIDEWIRE ANG ZIPWIRE 038X150 (WIRE) IMPLANT
GUIDEWIRE STR DUAL SENSOR (WIRE) ×4 IMPLANT
IV NS IRRIG 3000ML ARTHROMATIC (IV SOLUTION) ×6 IMPLANT
KIT BALLIN UROMAX 15FX10 (LABEL) IMPLANT
KIT BALLN UROMAX 15FX4 (MISCELLANEOUS) IMPLANT
KIT BALLN UROMAX 26 75X4 (MISCELLANEOUS)
LASER FIBER DISP (UROLOGICAL SUPPLIES) IMPLANT
LASER FIBER DISP 1000U (UROLOGICAL SUPPLIES) ×3 IMPLANT
PACK CYSTOSCOPY (CUSTOM PROCEDURE TRAY) ×4 IMPLANT
SET HIGH PRES BAL DIL (LABEL)
SHEATH URET ACCESS 12FR/35CM (UROLOGICAL SUPPLIES) IMPLANT
SHEATH URET ACCESS 12FR/55CM (UROLOGICAL SUPPLIES) IMPLANT
WATER STERILE IRR 500ML POUR (IV SOLUTION) ×2 IMPLANT

## 2011-10-30 NOTE — Transfer of Care (Signed)
Immediate Anesthesia Transfer of Care Note  Patient: Jacob Fowler  Procedure(s) Performed: Procedure(s) (LRB): CYSTOSCOPY WITH LITHOLAPAXY (N/A) CYSTOSCOPY WITH RETROGRADE PYELOGRAM (Right)  Patient Location: PACU  Anesthesia Type: General  Level of Consciousness: awake, oriented, sedated and patient cooperative  Airway & Oxygen Therapy: Patient Spontanous Breathing and Patient connected to face mask oxygen  Post-op Assessment: Report given to PACU RN and Post -op Vital signs reviewed and stable  Post vital signs: Reviewed and stable  Complications: No apparent anesthesia complications

## 2011-10-30 NOTE — Anesthesia Procedure Notes (Signed)
Procedure Name: LMA Insertion Date/Time: 10/30/2011 11:27 AM Performed by: Renella Cunas D Pre-anesthesia Checklist: Patient identified, Emergency Drugs available, Suction available and Patient being monitored Patient Re-evaluated:Patient Re-evaluated prior to inductionOxygen Delivery Method: Circle System Utilized Preoxygenation: Pre-oxygenation with 100% oxygen Intubation Type: IV induction Ventilation: Mask ventilation without difficulty LMA: LMA inserted LMA Size: 4.0 Number of attempts: 1 Airway Equipment and Method: bite block Placement Confirmation: positive ETCO2 Tube secured with: Tape Dental Injury: Teeth and Oropharynx as per pre-operative assessment

## 2011-10-30 NOTE — Anesthesia Postprocedure Evaluation (Signed)
  Anesthesia Post-op Note  Patient: Jacob Fowler  Procedure(s) Performed: Procedure(s) (LRB): CYSTOSCOPY WITH LITHOLAPAXY (N/A) CYSTOSCOPY WITH RETROGRADE PYELOGRAM (Right)  Patient Location: PACU  Anesthesia Type: General  Level of Consciousness: awake and alert   Airway and Oxygen Therapy: Patient Spontanous Breathing  Post-op Pain: mild  Post-op Assessment: Post-op Vital signs reviewed, Patient's Cardiovascular Status Stable, Respiratory Function Stable, Patent Airway and No signs of Nausea or vomiting  Post-op Vital Signs: stable  Complications: No apparent anesthesia complications

## 2011-10-30 NOTE — H&P (Signed)
History of Present Illness      History of nephrolithiasis: A CT scan in 11/09 revealed a 1 mm stone in the lower pole of his right kidney. He has passed stones previously.A CT scan done on 08/23/10 revealed a large (11 x 7 mm) stone in the lower pole of his right kidney. It had Hounsfield units of about 700. No right renal calculi were identified. He underwent lithotripsy of a right renal calcul on 09/05/10. He had excellent fragmentation of the stone and passed all visible stone fragments by KUB.  Stone analysis: Uric acid 90% and tricalcium phosphate 10%      Peyronie's disease: He underwent a Nesbit tuck for this in 9/06.   Interval history: He began to have gross hematuria. Then he started having pain in his low back with radiation into the anterior abdomen and groin region as well as into his testicle. He's not seen any testicular swelling. He has had associated increase frequency as well as nocturia about every 2 hours last night. His pain is not modified by positional change. It is moderate in severity.   Past Medical History Problems  1. History of  Diabetes Mellitus 250.00 2. History of  Distal Ureteral Stone On The Right 592.1 3. History of  Heartburn 787.1 4. History of  Hypertension 401.9 5. History of  Multiple Sclerosis 340 6. History of  Murmurs 785.2 7. History of  Peyronie's Disease 607.85 8. History of  Ureteral Stone Left 592.1  Surgical History Problems  1. History of  Lithotripsy 2. History of  Surg Penis Plastic Operation To Correct Angulation  Current Meds 1. Actos 30 MG Oral Tablet; Therapy: (Recorded:13Nov2009) to 2. Aspirin 81 MG Oral Tablet; Therapy: (Recorded:13Nov2009) to 3. Betaseron 0.3 MG Subcutaneous Solution Reconstituted; Therapy: (Recorded:19Sep2011) to 4. Crestor 5 MG Oral Tablet; Therapy: (Recorded:19Sep2011) to 5. Doxycycline Hyclate TABS; TAKE 1 TABLET DAILY; Therapy: (Recorded:07Feb2012) to 6. Fish Oil CAPS; Therapy: (Recorded:13Nov2009) to 7.  Isosorbide Mononitrate 30 MG TB24; Therapy: (Recorded:13Nov2009) to 8. Lovaza 1 GM Oral Capsule; Therapy: (Recorded:19Sep2011) to 9. MetFORMIN HCl 500 MG Oral Tablet; Therapy: (Recorded:13Nov2009) to 10. Norvasc 2.5 MG Oral Tablet; Therapy: (Recorded:13Nov2009) to 11. Oxycodone-Acetaminophen 5-500 MG Oral Capsule; Therapy: 20Feb2012 to 12. Prevacid 15 MG Oral Capsule Delayed Release; Therapy: (Recorded:19Sep2011) to 13. Sprix 15.75 MG/SPRAY Nasal Solution; INSTILL 1 SQUIRT Other in each nostril every 6-8 hours   prn pain; Therapy: 23Feb2012 to (Last Rx:23Feb2012) 14. Tamsulosin HCl 0.4 MG Oral Capsule; Take one tablet daily at bedtime; Therapy: 20Feb2012 to   (Evaluate:07Mar2014); Last Rx:12Mar2013 15. Urocit-K 15 15 MEQ (1620 MG) Oral Tablet Extended Release; Take 1 tablet twice daily with   meals; Therapy: 12Mar2012 to (Evaluate:07Mar2013); Last Rx:12Mar2012 16. Vitamin D CAPS; Therapy: (Recorded:13Nov2009) to  Allergies Medication  1. No Known Drug Allergies  Family History Problems  1. Paternal history of  Death In The Family Father age 56; cancer 2. Maternal history of  Death In The Family Mother age 73; heart attack 3. Family history of  Family Health Status Number Of Children 1 son; 1 daughter  Social History Problems  1. Caffeine Use 2. Marital History - Currently Married 3. Occupation: Engineer, maintenance 4. Tobacco Use V15.82 smoked in high school yrs Denied  5. History of  Alcohol Use  Review of Systems - Negative except as above  PE: General appearance: alert, cooperative and no distress Neck: no adenopathy and no JVD Resp: clear to auscultation bilaterally Cardio: regular rate and rhythm GI: soft, non-tender; bowel sounds  normal; no masses,  no organomegaly Male genitalia: normal, penis: no lesions or discharge. testes: no masses or tenderness. no hernias Extremities: extremities normal, atraumatic, no cyanosis or edema Skin: Skin color, texture, turgor normal.  No rashes or lesions Neurologic: Grossly normal  Assessment Assessed  1. Nephrolithiasis Of The Right Kidney 592.0 2. Gross Hematuria 599.71 3. Bladder Calculus 594.1 4. Hydronephrosis On The Right 591 5. Mid Ureteral Stone On The Right 592.1      I went over the results of his CT scan with him. It reveals a stone in the lower pole of his right kidney that is nonobstructing. There is right hydronephrosis and no significant calculi seen on the left-hand side. Hydronephrosis is secondary to a 4 mm stone in the mid to distal right ureter. It has Hounsfield units of approximately 350. There is also a bladder calculus. We discussed the treatment options which would be medical expulsive therapy with concomitant dissolution therapy versus surgical management. I went over the pluses and minuses of each of these options. He's been having a lot of pain. He therefore elected to proceed with surgical treatment. He would require right ureteroscopy and likely laser lithotripsy of his stone as well as a cystolitholapaxy at the same time. I've gone over each of these procedures with him in detail including the risks and complications. He understands and has elected to proceed.   Plan    1. He did give him a prescription for pain medication. 2. He is already on tamsulosin which will serve as medical expulsive therapy. 3. He is scheduled for right ureteroscopy and also cystolitholapaxy.

## 2011-10-30 NOTE — Op Note (Signed)
PATIENT:  Jacob Fowler  PRE-OPERATIVE DIAGNOSIS:  1. Right ureteral calculus 2. Bladder calculus  POST-OPERATIVE DIAGNOSIS:  1. History of right ureteral calculus 2. Bladder calculus  PROCEDURE:  Procedure(s): 1. Cystoscopy with right retrograde pyelogram including interpretation. 2. Cystolitholapaxy (12 mm)  SURGEON:  Garnett Farm  INDICATION: Jacob Fowler is a 61 year old male who recently presented with gross hematuria and low back pain radiating into the anterior abdomen and groin region on the right-hand side. He was found by CT scan to have a stone in his distal ureter measuring 6 x 7 mm as well as a 12 mm bladder stone. We discussed management of both of these simultaneously and brought to the operating room for that procedure. He had mentioned that he passed a stone recently. Reviewing his preoperative KUB I could not discern the stone in the distal right ureter however his bladder stone was again identified.  ANESTHESIA:  General  EBL:  Minimal  DRAINS: None  SPECIMEN:  Stone  DISPOSITION OF SPECIMEN:  given the patient  Description of procedure: After informed consent the patient was brought to the major OR, placed on the table administered general anesthesia. He was then moved to the dorsal lithotomy position and his genitalia were sterilely prepped and draped. An official timeout was then performed.  The 22 French cystoscope with 12 lens was then passed under direct vision down the urethra which was noted be normal. The prostatic urethra revealed no lateral lobe hypertrophy but he did have a median lobe present. The bladder was then entered and fully and systematically inspected. Ureteral orifices were noted to be of normal configuration and position. There was 1+ trabeculation of the bladder. No tumors or inflammatory lesions were identified within the bladder. A single stone was seen on the floor of the bladder and photographed.  A 6 French open-ended ureteral  catheter was then passed through the cystoscope and into the right ureteral orifice. A right retrograde pyelogram was performed in the standard fashion by first injecting full-strength contrast through the open ended catheter and into the right ureter. This was performed while the ureter was being visualized under fluoroscopy and revealed the ureter to be normal throughout its course. Specifically there was no evidence of hydronephrosis or filling defects. I then noted the intrarenal collecting system was noted to be normal and removed the open-ended ureteral catheter watching the contrast passed down the ureter under fluoroscopy unobstructed. It appears he has passed his right ureteral stone.  I then treated his bladder calculus with the holmium laser using a 1000  fiber. The stone was broken into multiple fragments and removed from the bladder with the Microvasive evacuator. Reinspection of the bladder revealed the mucosa to be intact and there was no evidence of bleeding or residual stone fragments and so I emptied the bladder and removed the cystoscope. The patient was awakened and taken to the recovery room in stable and satisfactory condition. He tolerated C. well and there were no intraoperative complications.  PLAN OF CARE: Discharge to home after PACU  PATIENT DISPOSITION:  PACU - hemodynamically stable.

## 2011-10-30 NOTE — Discharge Instructions (Addendum)
Post Bladder Surgery Instructions ° ° °General instructions: °   ° Your recent bladder surgery requires very little post hospital care but some definite precautions. ° °Despite the fact that no skin incisions were used, the area around the bladder incisions are raw and covered with scabs to promote healing and prevent bleeding. Certain precautions are needed to insure that the scabs are not disturbed over the next 2-4 weeks while the healing proceeds. ° °Because the raw surface inside your bladder and the irritating effects of urine you may expect frequency of urination and/or urgency (a stronger desire to urinate) and perhaps even getting up at night more often. This will usually resolve or improve slowly over the healing period. You may see some blood in your urine over the first 6 weeks. Do not be alarmed, even if the urine was clear for a while. Get off your feet and drink lots of fluids until clearing occurs. If you start to pass clots or don't improve call us. ° °Catheter: (If you are discharged with a catheter.) ° °1. Keep your catheter secured to your leg at all times with tape or the supplied strap. °2. You may experience leakage of urine around your catheter- as long as the  °catheter continues to drain, this is normal.  If your catheter stops draining  °go to the ER. °3. You may also have blood in your urine, even after it has been clear for  °several days; you may even pass some small blood clots or other material.  This  °is normal as well.  If this happens, sit down and drink plenty of water to help  °make urine to flush out your bladder.  If the blood in your urine becomes worse  °after doing this, contact our office or return to the ER. °4. You may use the leg bag (small bag) during the day, but use the large bag at  °night. ° °Diet: ° °You may return to your normal diet immediately. Because of the raw surface of your bladder, alcohol, spicy foods, foods high in acid and drinks with caffeine may  cause irritation or frequency and should be used in moderation. To keep your urine flowing freely and avoid constipation, drink plenty of fluids during the day (8-10 glasses). Tip: Avoid cranberry juice because it is very acidic. ° °Activity: ° °Your physical activity doesn't need to be restricted. However, if you are very active, you may see some blood in the urine. We suggest that you reduce your activity under the circumstances until the bleeding has stopped. ° °Bowels: ° °It is important to keep your bowels regular during the postoperative period. Straining with bowel movements can cause bleeding. A bowel movement every other day is reasonable. Use a mild laxative if needed, such as milk of magnesia 2-3 tablespoons, or 2 Dulcolax tablets. Call if you continue to have problems. If you had been taking narcotics for pain, before, during or after your surgery, you may be constipated. Take a laxative if necessary. ° ° ° °Medication: ° °You should resume your pre-surgery medications unless told not to. In addition you may be given an antibiotic to prevent or treat infection. Antibiotics are not always necessary. All medication should be taken as prescribed until the bottles are finished unless you are having an unusual reaction to one of the drugs. ° ° °Post Anesthesia Home Care Instructions ° °Activity: °Get plenty of rest for the remainder of the day. A responsible adult should stay with you for   24 hours following the procedure.  °For the next 24 hours, DO NOT: °-Drive a car °-Operate machinery °-Drink alcoholic beverages °-Take any medication unless instructed by your physician °-Make any legal decisions or sign important papers. ° °Meals: °Start with liquid foods such as gelatin or soup. Progress to regular foods as tolerated. Avoid greasy, spicy, heavy foods. If nausea and/or vomiting occur, drink only clear liquids until the nausea and/or vomiting subsides. Call your physician if vomiting continues. ° °Special  Instructions/Symptoms: °Your throat may feel dry or sore from the anesthesia or the breathing tube placed in your throat during surgery. If this causes discomfort, gargle with warm salt water. The discomfort should disappear within 24 hours. ° ° °

## 2011-10-30 NOTE — Anesthesia Preprocedure Evaluation (Signed)
Anesthesia Evaluation  Patient identified by MRN, date of birth, ID band Patient awake    Reviewed: Allergy & Precautions, H&P , NPO status , Patient's Chart, lab work & pertinent test results  Airway Mallampati: II TM Distance: >3 FB Neck ROM: full    Dental No notable dental hx. (+) Teeth Intact and Dental Advisory Given   Pulmonary neg pulmonary ROS,  breath sounds clear to auscultation  Pulmonary exam normal       Cardiovascular Exercise Tolerance: Good hypertension, Pt. 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  Cardiology visit 3/13 OK.  Had episode of coronary spasm 2004.   Neuro/Psych Multiple Sclerosis.  Right sided weakness.  Walks with a cane. negative neurological ROS  negative psych ROS   GI/Hepatic negative GI ROS, Neg liver ROS, hiatal hernia, GERD-  Medicated and Controlled,  Endo/Other  negative endocrine ROSDiabetes mellitus-, Well Controlled, Type 2, Oral Hypoglycemic Agents  Renal/GU negative Renal ROS  negative genitourinary   Musculoskeletal   Abdominal   Peds  Hematology negative hematology ROS (+)   Anesthesia Other Findings   Reproductive/Obstetrics negative OB ROS                           Anesthesia Physical Anesthesia Plan  ASA: III  Anesthesia Plan: General   Post-op Pain Management:    Induction: Intravenous  Airway Management Planned: LMA  Additional Equipment:   Intra-op Plan:   Post-operative Plan:   Informed Consent: I have reviewed the patients History and Physical, chart, labs and discussed the procedure including the risks, benefits and alternatives for the proposed anesthesia with the patient or authorized representative who has indicated his/her understanding and acceptance.   Dental advisory given  Plan Discussed with: Surgeon  Anesthesia Plan Comments:          Anesthesia Quick Evaluation

## 2011-10-31 ENCOUNTER — Encounter (HOSPITAL_BASED_OUTPATIENT_CLINIC_OR_DEPARTMENT_OTHER): Payer: Self-pay | Admitting: Urology

## 2011-11-03 ENCOUNTER — Encounter (HOSPITAL_BASED_OUTPATIENT_CLINIC_OR_DEPARTMENT_OTHER): Payer: Self-pay

## 2012-09-16 ENCOUNTER — Encounter: Payer: Self-pay | Admitting: Cardiology

## 2012-10-14 ENCOUNTER — Other Ambulatory Visit (INDEPENDENT_AMBULATORY_CARE_PROVIDER_SITE_OTHER): Payer: Medicare Other

## 2012-10-14 DIAGNOSIS — N390 Urinary tract infection, site not specified: Secondary | ICD-10-CM

## 2012-10-14 DIAGNOSIS — R7989 Other specified abnormal findings of blood chemistry: Secondary | ICD-10-CM

## 2012-10-14 LAB — POCT UA - MICROSCOPIC ONLY
Crystals, Ur, HPF, POC: NEGATIVE
Mucus, UA: NEGATIVE
WBC, Ur, HPF, POC: NEGATIVE
Yeast, UA: NEGATIVE

## 2012-10-14 LAB — POCT URINALYSIS DIPSTICK
Bilirubin, UA: NEGATIVE
Blood, UA: NEGATIVE
Ketones, UA: NEGATIVE
Leukocytes, UA: NEGATIVE
Protein, UA: NEGATIVE
pH, UA: 7

## 2012-10-14 NOTE — Progress Notes (Signed)
Patient came in for labs only.

## 2012-10-16 NOTE — Progress Notes (Signed)
Referral to Dr Annabell Howells due to elevated PSA

## 2012-10-18 NOTE — Addendum Note (Signed)
Addended by: Bearl Mulberry on: 10/18/2012 03:56 PM   Modules accepted: Orders

## 2012-11-06 ENCOUNTER — Other Ambulatory Visit: Payer: Self-pay | Admitting: Gastroenterology

## 2012-12-26 ENCOUNTER — Other Ambulatory Visit: Payer: Self-pay | Admitting: *Deleted

## 2012-12-26 MED ORDER — METFORMIN HCL 500 MG PO TABS: 1000.0000 mg | ORAL_TABLET | Freq: Two times a day (BID) | ORAL | Status: DC

## 2013-01-06 ENCOUNTER — Encounter: Payer: Self-pay | Admitting: Family Medicine

## 2013-01-06 ENCOUNTER — Ambulatory Visit (INDEPENDENT_AMBULATORY_CARE_PROVIDER_SITE_OTHER): Payer: Medicare Other | Admitting: Family Medicine

## 2013-01-06 VITALS — BP 93/61 | HR 75 | Temp 97.1°F | Ht 64.5 in | Wt 154.0 lb

## 2013-01-06 DIAGNOSIS — E119 Type 2 diabetes mellitus without complications: Secondary | ICD-10-CM

## 2013-01-06 DIAGNOSIS — E785 Hyperlipidemia, unspecified: Secondary | ICD-10-CM

## 2013-01-06 DIAGNOSIS — N400 Enlarged prostate without lower urinary tract symptoms: Secondary | ICD-10-CM

## 2013-01-06 DIAGNOSIS — G35 Multiple sclerosis: Secondary | ICD-10-CM | POA: Insufficient documentation

## 2013-01-06 DIAGNOSIS — I10 Essential (primary) hypertension: Secondary | ICD-10-CM

## 2013-01-06 DIAGNOSIS — G35D Multiple sclerosis, unspecified: Secondary | ICD-10-CM

## 2013-01-06 DIAGNOSIS — R10A0 Flank pain, unspecified side: Secondary | ICD-10-CM

## 2013-01-06 DIAGNOSIS — R350 Frequency of micturition: Secondary | ICD-10-CM

## 2013-01-06 DIAGNOSIS — E559 Vitamin D deficiency, unspecified: Secondary | ICD-10-CM

## 2013-01-06 DIAGNOSIS — R109 Unspecified abdominal pain: Secondary | ICD-10-CM

## 2013-01-06 DIAGNOSIS — I2510 Atherosclerotic heart disease of native coronary artery without angina pectoris: Secondary | ICD-10-CM

## 2013-01-06 LAB — POCT URINALYSIS DIPSTICK
Bilirubin, UA: NEGATIVE
Blood, UA: NEGATIVE
Glucose, UA: NEGATIVE
Ketones, UA: NEGATIVE
Leukocytes, UA: NEGATIVE
Nitrite, UA: NEGATIVE

## 2013-01-06 LAB — POCT UA - MICROSCOPIC ONLY
Mucus, UA: NEGATIVE
RBC, urine, microscopic: NEGATIVE
WBC, Ur, HPF, POC: NEGATIVE
Yeast, UA: NEGATIVE

## 2013-01-06 LAB — POCT CBC
HCT, POC: 33.4 % — AB (ref 43.5–53.7)
Hemoglobin: 11.7 g/dL — AB (ref 14.1–18.1)
MPV: 8.5 fL (ref 0–99.8)
POC Granulocyte: 4.2 (ref 2–6.9)
POC LYMPH PERCENT: 13.7 %L (ref 10–50)
RBC: 4 M/uL — AB (ref 4.69–6.13)

## 2013-01-06 LAB — BASIC METABOLIC PANEL WITH GFR
CO2: 27 mEq/L (ref 19–32)
Calcium: 9.7 mg/dL (ref 8.4–10.5)
Creat: 1.13 mg/dL (ref 0.50–1.35)
GFR, Est African American: 80 mL/min
GFR, Est Non African American: 69 mL/min
Sodium: 138 mEq/L (ref 135–145)

## 2013-01-06 LAB — HEPATIC FUNCTION PANEL
AST: 19 U/L (ref 0–37)
Albumin: 4 g/dL (ref 3.5–5.2)
Total Bilirubin: 0.4 mg/dL (ref 0.3–1.2)

## 2013-01-06 LAB — POCT GLYCOSYLATED HEMOGLOBIN (HGB A1C): Hemoglobin A1C: 6.9

## 2013-01-06 MED ORDER — CEPHALEXIN 500 MG PO CAPS: 500.0000 mg | ORAL_CAPSULE | Freq: Two times a day (BID) | ORAL | Status: DC

## 2013-01-06 NOTE — Progress Notes (Signed)
  Subjective:    [name], male    DOB: Feb 26, 1951, 62 y.o.   MRN: [id number]  HPI Patient returns to clinic today for followup of chronic medical problems. See copy of outside blood sugars they appear to be running pretty well fasting in the 1:30 range on the average. He is followed by Dr. Epimenio Foot for his MS. According to patient he now feels this may be a more progressive MS situation. He still sees him about every 6 months Dr Vernie Ammons  follows his renal calculi. He still has some right flank pain. Dr. Patricia Nettle did an endoscopy which was normal and a colonoscopy which showed polyps according to the patient and that will have to be repeated in 5 years. This was done in April of this year. The PSA was 1.7 a in July of 2012. The end of March of 2014 it was 3.08.  Review of Systems  Constitutional: Positive for fatigue (some).  HENT: Negative.   Eyes: Negative.   Respiratory: Negative.   Cardiovascular: Negative.   Gastrointestinal: Negative.   Genitourinary: Positive for frequency (at night).  Musculoskeletal: Negative.   Skin: Negative.   Allergic/Immunologic: Negative.   Psychiatric/Behavioral: Negative.        Objective:   Physical Exam BP 93/61  Pulse 75  Temp(Src) 97.1 F (36.2 C) (Oral)  Ht 5' 4.5" (1.638 m)  Wt 154 lb (69.854 kg)  BMI 26.04 kg/m2  The patient appeared well nourished and normally developed, alert and oriented to time and place. He does have some apparent weakness on the right lower extremity Speech, behavior and judgement appear normal. Vital signs as documented.  Head exam is unremarkable. No scleral icterus or pallor noted.  Neck is without jugular venous distension, thyromegally, or carotid bruits. Carotid upstrokes are brisk bilaterally. No cervical adenopathy. Lungs are clear anteriorly and posteriorly to auscultation. Normal respiratory effort. Cardiac exam reveals regular rate and rhythm at 72 per minute. First and second heart sounds  normal.  No murmurs, rubs or gallops.  Abdominal exam reveals normal bowl sounds, no masses, no organomegaly and no aortic enlargement. No inguinal adenopathy. There's no abdominal tenderness Extremities are nonedematous and both femoral and pedal pulses are normal. Skin without pallor or jaundice.  Warm and dry, without rash. There were areas of erythema on right cheek and right for head slightly papular . Neurologic exam reveals normal deep tendon reflexes and normal sensation. Weakness right lower extremity. Diabetic foot exam was done          Assessment & Plan:  1. Hypertension - POCT CBC - BASIC METABOLIC PANEL WITH GFR  2. Dyslipidemia - NMR Lipoprofile with Lipids - Hepatic function panel  3. Diabetes - POCT glycosylated hemoglobin (Hb A1C)  4. Vitamin D deficiency - Vitamin D 25 hydroxy  5. Multiple sclerosis  6. CORONARY ATHEROSCLEROSIS, NATIVE VESSEL  7. DM  8. DYSLIPIDEMIA  9. HYPERTENSION  10. BPH (benign prostatic hypertrophy) - PSA  11. Flank pain - POCT UA - Microscopic Only - POCT urinalysis dipstick  12. Urinary frequency - POCT UA - Microscopic Only - POCT urinalysis dipstick  13.Folliculitis,face -Keflex 500 twice daily for 10 days  Patient Instructions  Fall precautions discussed Continue current meds and therapeutic lifestyle changes Take antibiotic as directed . Call us regarding the condition of the skin on your face when antibiotic is complete

## 2013-01-06 NOTE — Patient Instructions (Addendum)
Fall precautions discussed Continue current meds and therapeutic lifestyle changes Take antibiotic as directed . Call us regarding the condition of the skin on your face when antibiotic is complete

## 2013-01-07 LAB — VITAMIN D 25 HYDROXY (VIT D DEFICIENCY, FRACTURES): Vit D, 25-Hydroxy: 54 ng/mL (ref 30–89)

## 2013-01-07 LAB — NMR LIPOPROFILE WITH LIPIDS
HDL Size: 8.1 nm — ABNORMAL LOW (ref >=9.2)
HDL-C: 29 mg/dL — ABNORMAL LOW (ref >=40)
LDL (calc): 137 mg/dL — ABNORMAL HIGH (ref <100)
LDL Particle Number: 2444 nmol/L — ABNORMAL HIGH (ref <1000)
LDL Size: 20 nm — ABNORMAL LOW (ref >20.5)
LP-IR Score: 53 — ABNORMAL HIGH (ref <=45)
Triglycerides: 149 mg/dL (ref <150)
VLDL Size: 40.1 nm (ref <=46.6)

## 2013-01-24 ENCOUNTER — Other Ambulatory Visit: Payer: Self-pay

## 2013-01-24 MED ORDER — METFORMIN HCL 500 MG PO TABS: 1000.0000 mg | ORAL_TABLET | Freq: Two times a day (BID) | ORAL | Status: DC

## 2013-02-24 ENCOUNTER — Other Ambulatory Visit: Payer: Self-pay | Admitting: *Deleted

## 2013-02-24 MED ORDER — AMLODIPINE BESYLATE 2.5 MG PO TABS: 2.5000 mg | ORAL_TABLET | Freq: Every morning | ORAL | Status: DC

## 2013-02-27 ENCOUNTER — Other Ambulatory Visit: Payer: Self-pay | Admitting: Family Medicine

## 2013-03-28 ENCOUNTER — Other Ambulatory Visit: Payer: Self-pay

## 2013-03-28 ENCOUNTER — Other Ambulatory Visit: Payer: Self-pay | Admitting: *Deleted

## 2013-03-28 MED ORDER — PIOGLITAZONE HCL 30 MG PO TABS: 30.0000 mg | ORAL_TABLET | Freq: Every day | ORAL | Status: DC

## 2013-03-28 MED ORDER — ISOSORBIDE MONONITRATE ER 60 MG PO TB24: ORAL_TABLET | ORAL | Status: DC

## 2013-03-28 MED ORDER — METFORMIN HCL 500 MG PO TABS: 1000.0000 mg | ORAL_TABLET | Freq: Two times a day (BID) | ORAL | Status: DC

## 2013-05-14 ENCOUNTER — Ambulatory Visit (INDEPENDENT_AMBULATORY_CARE_PROVIDER_SITE_OTHER): Payer: Medicare Other | Admitting: Family Medicine

## 2013-05-14 ENCOUNTER — Encounter: Payer: Self-pay | Admitting: Family Medicine

## 2013-05-14 VITALS — BP 117/73 | HR 68 | Temp 99.2°F | Ht 64.5 in | Wt 150.0 lb

## 2013-05-14 DIAGNOSIS — L719 Rosacea, unspecified: Secondary | ICD-10-CM

## 2013-05-14 DIAGNOSIS — G35 Multiple sclerosis: Secondary | ICD-10-CM

## 2013-05-14 DIAGNOSIS — I10 Essential (primary) hypertension: Secondary | ICD-10-CM

## 2013-05-14 DIAGNOSIS — E559 Vitamin D deficiency, unspecified: Secondary | ICD-10-CM

## 2013-05-14 DIAGNOSIS — E785 Hyperlipidemia, unspecified: Secondary | ICD-10-CM

## 2013-05-14 DIAGNOSIS — Z23 Encounter for immunization: Secondary | ICD-10-CM

## 2013-05-14 DIAGNOSIS — I2510 Atherosclerotic heart disease of native coronary artery without angina pectoris: Secondary | ICD-10-CM

## 2013-05-14 DIAGNOSIS — E119 Type 2 diabetes mellitus without complications: Secondary | ICD-10-CM

## 2013-05-14 LAB — POCT CBC
Granulocyte percent: 74.3 %G (ref 37–80)
HCT, POC: 36.4 % — AB (ref 43.5–53.7)
Lymph, poc: 1.1 (ref 0.6–3.4)
MCHC: 32.2 g/dL (ref 31.8–35.4)
MPV: 8 fL (ref 0–99.8)
POC Granulocyte: 4.1 (ref 2–6.9)
POC LYMPH PERCENT: 19.7 %L (ref 10–50)
Platelet Count, POC: 186 10*3/uL (ref 142–424)
RDW, POC: 14.4 %
WBC: 5.5 10*3/uL (ref 4.6–10.2)

## 2013-05-14 LAB — POCT GLYCOSYLATED HEMOGLOBIN (HGB A1C): Hemoglobin A1C: 6.1

## 2013-05-14 LAB — POCT UA - MICROALBUMIN: Microalbumin Ur, POC: NEGATIVE mg/L

## 2013-05-14 NOTE — Progress Notes (Signed)
Subjective:    [name], male    DOB: [birth year], 62 y.o.   MRN: [id number]  HPI Pt here for follow up and management of chronic medical problems.  His biggest problems continue with his MS and his right-sided weakness. He still rides a tractor. He feels comfortable with this. He is followed regularly by the neurologist, the cardiologist, and the urologist. He says his blood sugars at home have been running anywhere from the 90s up to the 120 range and this is fasting and 4 hours after eating. He is due to get a flu shot today and hopefully a Prevnar. Lab work has been drawn and he will also give [place] a urine specimen. He will return his FOBT.     Patient Active Problem List   Diagnosis Date Noted  . Multiple sclerosis 01/06/2013  . Ureteral calculus, right 10/30/2011  . Bladder calculus 10/30/2011  . Tobacco use 09/27/2011  . CORONARY ATHEROSCLEROSIS, NATIVE VESSEL 10/06/2009  . DM 10/01/2009  . DYSLIPIDEMIA 10/01/2009  . HYPERTENSION 10/01/2009   Outpatient Encounter Prescriptions as of 05/14/2013  Medication Sig Dispense Refill  . amLODipine (NORVASC) 2.5 MG tablet Take 1 tablet (2.5 mg total) by mouth every morning.  90 tablet  0  . aspirin (LONGS ADULT LOW STRENGTH ASA) 81 MG EC tablet Take 81 mg by mouth daily.       . cholecalciferol (VITAMIN D) 1000 UNITS tablet Take 1,000 Units by mouth daily.      . Ezetimibe (ZETIA PO) Take 5 mg by mouth daily.      . fish oil-omega-3 fatty acids 1000 MG capsule Take 2 g by mouth 2 (two) times daily.      . interferon beta-1b (BETASERON) 0.3 MG injection Inject 0.25 mg into the skin every other day.       . isosorbide mononitrate (IMDUR) 60 MG 24 hr tablet Take 1/2 tablet every day  15 tablet  2  . lisinopril (PRINIVIL,ZESTRIL) 10 MG tablet TAKE ONE TABLET BY MOUTH EVERY DAY  30 tablet  4  . metFORMIN (GLUCOPHAGE) 500 MG tablet Take 2 tablets (1,000 mg total) by mouth 2 (two) times daily with a meal.  120 tablet  0  .  omeprazole (PRILOSEC) 20 MG capsule Take 20 mg by mouth daily.      . pioglitazone (ACTOS) 30 MG tablet Take 1 tablet (30 mg total) by mouth daily.  30 tablet  2  . Tamsulosin HCl (FLOMAX) 0.4 MG CAPS Take 0.4 mg by mouth daily after supper.       . [DISCONTINUED] Multiple Vitamin (MULTI-VITAMIN DAILY PO) Take 1 tablet by mouth daily.       . [DISCONTINUED] cephALEXin (KEFLEX) 500 MG capsule Take 1 capsule (500 mg total) by mouth 2 (two) times daily.  20 capsule  0  . [DISCONTINUED] ISOSORBIDE PO Take 30 mg by mouth every morning.       . [DISCONTINUED] potassium citrate (UROCIT-K) 10 MEQ (1080 MG) SR tablet Take 10 mEq by mouth daily.       No facility-administered encounter medications on file as of 05/14/2013.    Review of Systems  Constitutional: Negative.   HENT: Negative.   Eyes: Negative.   Respiratory: Negative.   Cardiovascular: Negative.   Gastrointestinal: Negative.   Endocrine: Negative.   Genitourinary: Negative.   Musculoskeletal: Negative.   Skin: Positive for rash (redness on face ).  Allergic/Immunologic: Negative.   Neurological: Negative.   Hematological: Negative.  Psychiatric/Behavioral: Negative.        Objective:   Physical Exam  Nursing note and vitals reviewed. Constitutional: He is oriented to person, place, and time. He appears well-developed and well-nourished. No distress.  HENT:  Head: Normocephalic and atraumatic.  Right Ear: External ear normal.  Left Ear: External ear normal.  Nose: Nose normal.  Mouth/Throat: Oropharynx is clear and moist. No oropharyngeal exudate.  Nasal congestion bilateral  Eyes: Conjunctivae and EOM are normal. Pupils are equal, round, and reactive to light. Right eye exhibits no discharge. Left eye exhibits no discharge. No scleral icterus.  Neck: Normal range of motion. Neck supple. No tracheal deviation present. No thyromegaly present.  No carotid bruits  Cardiovascular: Normal rate, regular rhythm, normal heart  sounds and intact distal pulses.  Exam reveals no gallop and no friction rub.   No murmur heard. At 72 per minute  Pulmonary/Chest: Effort normal and breath sounds normal. No respiratory distress. He has no wheezes. He has no rales. He exhibits no tenderness.  Abdominal: Soft. Bowel sounds are normal. He exhibits no mass. There is no tenderness. There is no rebound and no guarding.  Musculoskeletal: He exhibits no edema and no tenderness.   Right sided weakness especially lower extremity Trouble arising from a supine position  Lymphadenopathy:    He has no cervical adenopathy.  Neurological: He is alert and oriented to person, place, and time. He has normal reflexes. No cranial nerve deficit.  Skin: Skin is warm and dry. Rash (facial rash right cheek pustular areas  present) noted. There is erythema. No pallor.  Psychiatric: He has a normal mood and affect. His behavior is normal. Judgment and thought content normal.   BP 117/73  Pulse 68  Temp(Src) 99.2 F (37.3 C) (Oral)  Ht 5' 4.5" (1.638 m)  Wt 150 lb (68.04 kg)  BMI 25.36 kg/m2        Assessment & Plan:   1. DM   2. DYSLIPIDEMIA   3. HYPERTENSION   4. Vitamin D deficiency   5. Multiple sclerosis   6. CORONARY ATHEROSCLEROSIS, NATIVE VESSEL   7. Rosacea    Orders Placed This Encounter  Procedures  . Hepatic function panel  . BMP8+EGFR  . Lipid panel  . Vit D  25 hydroxy (rtn osteoporosis monitoring)  . POCT CBC  . POCT glycosylated hemoglobin (Hb A1C)  . POCT UA - Microalbumin   Doxycycline 100 mg twice daily, take regularly for 2 weeks and then as needed  Patient Instructions  Continue current medications. Continue good therapeutic lifestyle changes.  Fall precautions discussed with patient. Follow up as planned and earlier as needed. Continue as aggressive therapeutic lifestyle changes as possible Monitor blood sugars as closely as possible Keep followup appointments with urologist, cardiologist, and  neurologist We will give you your flu shot today and Prevnar if available Take antibiotic as directed, call in 2-3 weeks if facial rash is not improved      Nyra Capes MD

## 2013-05-14 NOTE — Patient Instructions (Addendum)
Continue current medications. Continue good therapeutic lifestyle changes.  Fall precautions discussed with patient. Follow up as planned and earlier as needed. Continue as aggressive therapeutic lifestyle changes as possible Monitor blood sugars as closely as possible Keep followup appointments with urologist, cardiologist, and neurologist We will give you your flu shot today and Prevnar if available Take antibiotic as directed, call in 2-3 weeks if facial rash is not improved

## 2013-05-15 LAB — VITAMIN D 25 HYDROXY (VIT D DEFICIENCY, FRACTURES): Vit D, 25-Hydroxy: 43.8 ng/mL (ref 30.0–100.0)

## 2013-05-15 LAB — BMP8+EGFR
BUN/Creatinine Ratio: 23 — ABNORMAL HIGH (ref 10–22)
BUN: 21 mg/dL (ref 8–27)
Chloride: 102 mmol/L (ref 97–108)
Creatinine, Ser: 0.92 mg/dL (ref 0.76–1.27)
GFR calc Af Amer: 102 mL/min/{1.73_m2} (ref >59)
GFR calc non Af Amer: 88 mL/min/{1.73_m2} (ref >59)
Glucose: 104 mg/dL — ABNORMAL HIGH (ref 65–99)

## 2013-05-15 LAB — HEPATIC FUNCTION PANEL
Albumin: 4.6 g/dL (ref 3.6–4.8)
Alkaline Phosphatase: 64 IU/L (ref 39–117)
Total Protein: 7.1 g/dL (ref 6.0–8.5)

## 2013-05-15 LAB — LIPID PANEL
Chol/HDL Ratio: 6.8 ratio units — ABNORMAL HIGH (ref 0.0–5.0)
LDL Calculated: 148 mg/dL — ABNORMAL HIGH (ref 0–99)
Triglycerides: 100 mg/dL (ref 0–149)

## 2013-05-27 ENCOUNTER — Telehealth: Payer: Self-pay | Admitting: Family Medicine

## 2013-05-27 ENCOUNTER — Other Ambulatory Visit: Payer: Self-pay

## 2013-05-27 ENCOUNTER — Ambulatory Visit (INDEPENDENT_AMBULATORY_CARE_PROVIDER_SITE_OTHER): Payer: Medicare Other | Admitting: *Deleted

## 2013-05-27 ENCOUNTER — Other Ambulatory Visit (INDEPENDENT_AMBULATORY_CARE_PROVIDER_SITE_OTHER): Payer: Medicare Other

## 2013-05-27 DIAGNOSIS — Z23 Encounter for immunization: Secondary | ICD-10-CM

## 2013-05-27 DIAGNOSIS — Z1212 Encounter for screening for malignant neoplasm of rectum: Secondary | ICD-10-CM

## 2013-05-27 MED ORDER — AMLODIPINE BESYLATE 2.5 MG PO TABS: 2.5000 mg | ORAL_TABLET | Freq: Every morning | ORAL | Status: DC

## 2013-05-27 NOTE — Telephone Encounter (Signed)
Pt aware he can get the shot here

## 2013-05-29 LAB — FECAL OCCULT BLOOD, IMMUNOCHEMICAL: Fecal Occult Bld: NEGATIVE

## 2013-06-09 ENCOUNTER — Other Ambulatory Visit: Payer: Self-pay | Admitting: *Deleted

## 2013-06-09 MED ORDER — METFORMIN HCL 500 MG PO TABS: 1000.0000 mg | ORAL_TABLET | Freq: Two times a day (BID) | ORAL | Status: DC

## 2013-08-08 ENCOUNTER — Other Ambulatory Visit: Payer: Self-pay | Admitting: *Deleted

## 2013-08-08 ENCOUNTER — Telehealth: Payer: Self-pay | Admitting: Family Medicine

## 2013-08-08 MED ORDER — LISINOPRIL 10 MG PO TABS: 10.0000 mg | ORAL_TABLET | Freq: Every day | ORAL | Status: DC

## 2013-09-02 ENCOUNTER — Ambulatory Visit: Payer: Medicare Other | Admitting: Family Medicine

## 2013-09-08 ENCOUNTER — Other Ambulatory Visit: Payer: Self-pay | Admitting: *Deleted

## 2013-09-08 MED ORDER — PIOGLITAZONE HCL 30 MG PO TABS: 30.0000 mg | ORAL_TABLET | Freq: Every day | ORAL | Status: DC

## 2013-09-08 MED ORDER — ISOSORBIDE MONONITRATE ER 60 MG PO TB24: ORAL_TABLET | ORAL | Status: DC

## 2013-09-10 ENCOUNTER — Other Ambulatory Visit: Payer: Self-pay | Admitting: *Deleted

## 2013-09-11 ENCOUNTER — Ambulatory Visit: Payer: Medicare Other | Admitting: Family Medicine

## 2013-09-17 ENCOUNTER — Other Ambulatory Visit: Payer: Self-pay

## 2013-09-17 MED ORDER — DOXYCYCLINE HYCLATE 100 MG PO TABS: 100.0000 mg | ORAL_TABLET | Freq: Two times a day (BID) | ORAL | Status: DC

## 2013-09-17 NOTE — Telephone Encounter (Signed)
Last seen 05/14/13  DWM 

## 2013-09-25 ENCOUNTER — Ambulatory Visit (INDEPENDENT_AMBULATORY_CARE_PROVIDER_SITE_OTHER): Payer: Medicare HMO | Admitting: Family Medicine

## 2013-09-25 ENCOUNTER — Encounter: Payer: Self-pay | Admitting: Family Medicine

## 2013-09-25 VITALS — BP 126/73 | HR 68 | Temp 98.7°F | Ht 64.5 in | Wt 149.0 lb

## 2013-09-25 DIAGNOSIS — G35 Multiple sclerosis: Secondary | ICD-10-CM

## 2013-09-25 DIAGNOSIS — E559 Vitamin D deficiency, unspecified: Secondary | ICD-10-CM

## 2013-09-25 DIAGNOSIS — E119 Type 2 diabetes mellitus without complications: Secondary | ICD-10-CM

## 2013-09-25 DIAGNOSIS — I10 Essential (primary) hypertension: Secondary | ICD-10-CM

## 2013-09-25 DIAGNOSIS — E785 Hyperlipidemia, unspecified: Secondary | ICD-10-CM

## 2013-09-25 LAB — POCT CBC
Granulocyte percent: 79.8 %G (ref 37–80)
HEMATOCRIT: 36.2 % — AB (ref 43.5–53.7)
Hemoglobin: 11.2 g/dL — AB (ref 14.1–18.1)
Lymph, poc: 0.9 (ref 0.6–3.4)
MCH, POC: 25.5 pg — AB (ref 27–31.2)
MCHC: 31.1 g/dL — AB (ref 31.8–35.4)
MCV: 82 fL (ref 80–97)
MPV: 7.8 fL (ref 0–99.8)
POC Granulocyte: 5.1 (ref 2–6.9)
POC LYMPH PERCENT: 14.7 %L (ref 10–50)
Platelet Count, POC: 229 10*3/uL (ref 142–424)
RBC: 4.4 M/uL — AB (ref 4.69–6.13)
RDW, POC: 16.1 %
WBC: 6.4 10*3/uL (ref 4.6–10.2)

## 2013-09-25 LAB — POCT GLYCOSYLATED HEMOGLOBIN (HGB A1C)

## 2013-09-25 MED ORDER — DOXYCYCLINE HYCLATE 100 MG PO TABS: 100.0000 mg | ORAL_TABLET | Freq: Two times a day (BID) | ORAL | Status: DC

## 2013-09-25 NOTE — Progress Notes (Signed)
Subjective:    [name], male    DOB: 06/06/1951, 63 y.o.   MRN: [id number]  HPI Pt here for follow up and management of chronic medical problems. The patient's biggest problem is his multiple sclerosis. He is followed by a neurologist in [facility]. He also feels very fatigued and tired. He also complains of some soreness in the right chest and back.       Patient Active Problem List   Diagnosis Date Noted  . Multiple sclerosis 01/06/2013  . Ureteral calculus, right 10/30/2011  . Bladder calculus 10/30/2011  . Tobacco use 09/27/2011  . CORONARY ATHEROSCLEROSIS, NATIVE VESSEL 10/06/2009  . DM 10/01/2009  . DYSLIPIDEMIA 10/01/2009  . HYPERTENSION 10/01/2009   Outpatient Encounter Prescriptions as of 09/25/2013  Medication Sig  . amLODipine (NORVASC) 2.5 MG tablet Take 1 tablet (2.5 mg total) by mouth every morning.  Marland Kitchen aspirin (LONGS ADULT LOW STRENGTH ASA) 81 MG EC tablet Take 81 mg by mouth daily.   . cholecalciferol (VITAMIN D) 1000 UNITS tablet Take 1,000 Units by mouth daily.  Marland Kitchen doxycycline (VIBRA-TABS) 100 MG tablet Take 1 tablet (100 mg total) by mouth 2 (two) times daily.  . fish oil-omega-3 fatty acids 1000 MG capsule Take 2 g by mouth 2 (two) times daily.  . interferon beta-1b (BETASERON) 0.3 MG injection Inject 0.25 mg into the skin every other day.   . isosorbide mononitrate (IMDUR) 60 MG 24 hr tablet Take 1/2 tablet every day  . lisinopril (PRINIVIL,ZESTRIL) 10 MG tablet Take 1 tablet (10 mg total) by mouth daily.  . metFORMIN (GLUCOPHAGE) 500 MG tablet Take 2 tablets (1,000 mg total) by mouth 2 (two) times daily with a meal.  . omeprazole (PRILOSEC) 20 MG capsule Take 20 mg by mouth daily.  . pioglitazone (ACTOS) 30 MG tablet Take 1 tablet (30 mg total) by mouth daily.  . rosuvastatin (CRESTOR) 10 MG tablet Take 10 mg by mouth daily.  . Tamsulosin HCl (FLOMAX) 0.4 MG CAPS Take 0.4 mg by mouth daily after supper.   . [DISCONTINUED] Ezetimibe (ZETIA  PO) Take 5 mg by mouth daily.    Review of Systems  Constitutional: Positive for fatigue (no energy).  HENT: Negative.   Eyes: Negative.   Respiratory: Negative.   Cardiovascular: Negative.   Gastrointestinal: Negative.   Endocrine: Negative.   Genitourinary: Negative.   Musculoskeletal: Negative.   Skin: Negative.   Allergic/Immunologic: Negative.   Neurological: Negative.   Hematological: Negative.   Psychiatric/Behavioral: Negative.        Objective:   Physical Exam  Nursing note and vitals reviewed. Constitutional: He is oriented to person, place, and time. He appears well-developed and well-nourished. No distress.  Alert and pleasant using his cane because of his extremity weakness from the MS  HENT:  Head: Normocephalic and atraumatic.  Right Ear: External ear normal.  Left Ear: External ear normal.  Nose: Nose normal.  Mouth/Throat: Oropharynx is clear and moist. No oropharyngeal exudate.  Eyes: Conjunctivae and EOM are normal. Pupils are equal, round, and reactive to light. Right eye exhibits no discharge. Left eye exhibits no discharge. No scleral icterus.  Neck: Normal range of motion. Neck supple. No thyromegaly present.  Cardiovascular: Normal rate, regular rhythm, normal heart sounds and intact distal pulses.  Exam reveals no gallop and no friction rub.   No murmur heard. At 84 per minute  Pulmonary/Chest: Effort normal and breath sounds normal. No respiratory distress. He has no wheezes. He has  no rales. He exhibits no tenderness.  Abdominal: Soft. Bowel sounds are normal. He exhibits no mass. There is no tenderness. There is no rebound and no guarding.  Musculoskeletal: Normal range of motion. He exhibits no edema and no tenderness.  Lymphadenopathy:    He has no cervical adenopathy.  Neurological: He is alert and oriented to person, place, and time. He has normal reflexes. No cranial nerve deficit.  Lower extremity weakness  Skin: Skin is warm and dry. No  rash noted. No erythema. No pallor.  The carotid occipital scalp and irritated skin intact right lateral neck. Cryotherapy was applied to both of these areas without complication.  Psychiatric: He has a normal mood and affect. His behavior is normal. Judgment and thought content normal.   BP 126/73  Pulse 68  Temp(Src) 98.7 F (37.1 C) (Oral)  Ht 5' 4.5" (1.638 m)  Wt 149 lb (67.586 kg)  BMI 25.19 kg/m2        Assessment & Plan:  1. DM - POCT glycosylated hemoglobin (Hb A1C) - POCT CBC  2. DYSLIPIDEMIA - POCT CBC - NMR, lipoprofile  3. HYPERTENSION - POCT CBC - BMP8+EGFR - Hepatic function panel  4. Multiple sclerosis - POCT CBC  5. Vitamin D deficiency - Vit D  25 hydroxy (rtn osteoporosis monitoring)  Patient Instructions  Continue current medications. Continue good therapeutic lifestyle changes which include good diet and exercise. Fall precautions discussed with patient.   Please be sure and call back periodically and see if we have any Crestor or zetia samples We will call you with the results of your lab work once this is available Continue to be careful and did not comply Monitored her blood sugars when possible Arteries checked your feet regularly   Arrie Senate MD

## 2013-09-25 NOTE — Addendum Note (Signed)
Addended by: Zannie Cove on: 09/25/2013 03:53 PM   Modules accepted: Orders

## 2013-09-25 NOTE — Patient Instructions (Addendum)
Continue current medications. Continue good therapeutic lifestyle changes which include good diet and exercise. Fall precautions discussed with patient.   Please be sure and call back periodically and see if we have any Crestor or zetia samples We will call you with the results of your lab work once this is available Continue to be careful and did not comply Monitored her blood sugars when possible Arteries checked your feet regularly

## 2013-09-26 LAB — NMR, LIPOPROFILE
CHOLESTEROL: 197 mg/dL (ref <200)
HDL Cholesterol by NMR: 37 mg/dL — ABNORMAL LOW (ref >=40)
HDL PARTICLE NUMBER: 23.8 umol/L — AB (ref >=30.5)
LDL PARTICLE NUMBER: 2200 nmol/L — AB (ref <1000)
LDL SIZE: 20.4 nm — AB (ref >20.5)
LDLC SERPL CALC-MCNC: 147 mg/dL — ABNORMAL HIGH (ref <100)
LP-IR SCORE: 54 — AB (ref <=45)
Small LDL Particle Number: 1205 nmol/L — ABNORMAL HIGH (ref <=527)
Triglycerides by NMR: 63 mg/dL (ref <150)

## 2013-09-26 LAB — BMP8+EGFR
BUN/Creatinine Ratio: 27 — ABNORMAL HIGH (ref 10–22)
BUN: 22 mg/dL (ref 8–27)
CO2: 26 mmol/L (ref 18–29)
Calcium: 10 mg/dL (ref 8.6–10.2)
Chloride: 103 mmol/L (ref 97–108)
Creatinine, Ser: 0.83 mg/dL (ref 0.76–1.27)
GFR, EST AFRICAN AMERICAN: 108 mL/min/{1.73_m2} (ref >59)
GFR, EST NON AFRICAN AMERICAN: 94 mL/min/{1.73_m2} (ref >59)
GLUCOSE: 105 mg/dL — AB (ref 65–99)
POTASSIUM: 4 mmol/L (ref 3.5–5.2)
Sodium: 147 mmol/L — ABNORMAL HIGH (ref 134–144)

## 2013-09-26 LAB — HEPATIC FUNCTION PANEL
ALBUMIN: 4.3 g/dL (ref 3.6–4.8)
ALT: 11 IU/L (ref 0–44)
AST: 14 IU/L (ref 0–40)
Alkaline Phosphatase: 59 IU/L (ref 39–117)
BILIRUBIN DIRECT: 0.08 mg/dL (ref 0.00–0.40)
Total Bilirubin: 0.2 mg/dL (ref 0.0–1.2)
Total Protein: 7 g/dL (ref 6.0–8.5)

## 2013-09-26 LAB — VITAMIN D 25 HYDROXY (VIT D DEFICIENCY, FRACTURES): Vit D, 25-Hydroxy: 46.4 ng/mL (ref 30.0–100.0)

## 2013-10-02 ENCOUNTER — Other Ambulatory Visit: Payer: Self-pay | Admitting: *Deleted

## 2013-10-02 MED ORDER — LISINOPRIL 10 MG PO TABS: 10.0000 mg | ORAL_TABLET | Freq: Every day | ORAL | Status: DC

## 2013-10-09 ENCOUNTER — Encounter: Payer: Self-pay | Admitting: *Deleted

## 2013-11-05 ENCOUNTER — Telehealth: Payer: Self-pay | Admitting: Family Medicine

## 2013-11-05 MED ORDER — ROSUVASTATIN CALCIUM 10 MG PO TABS: 10.0000 mg | ORAL_TABLET | Freq: Every day | ORAL | Status: DC

## 2013-11-05 NOTE — Telephone Encounter (Signed)
Crestor available but no zetia. Samples up front.

## 2013-11-06 ENCOUNTER — Other Ambulatory Visit: Payer: Self-pay

## 2013-11-06 MED ORDER — METFORMIN HCL 500 MG PO TABS: 1000.0000 mg | ORAL_TABLET | Freq: Two times a day (BID) | ORAL | Status: DC

## 2013-12-08 ENCOUNTER — Other Ambulatory Visit: Payer: Self-pay | Admitting: *Deleted

## 2013-12-08 MED ORDER — PIOGLITAZONE HCL 30 MG PO TABS
30.0000 mg | ORAL_TABLET | Freq: Every day | ORAL | Status: DC
Start: 2013-12-08 — End: 2014-01-05

## 2013-12-08 MED ORDER — ISOSORBIDE MONONITRATE ER 60 MG PO TB24: ORAL_TABLET | ORAL | Status: DC

## 2013-12-24 ENCOUNTER — Ambulatory Visit: Payer: Medicare HMO | Admitting: Cardiology

## 2014-01-05 ENCOUNTER — Other Ambulatory Visit: Payer: Self-pay | Admitting: *Deleted

## 2014-01-05 MED ORDER — METFORMIN HCL 500 MG PO TABS: 1000.0000 mg | ORAL_TABLET | Freq: Two times a day (BID) | ORAL | Status: DC

## 2014-01-05 MED ORDER — PIOGLITAZONE HCL 30 MG PO TABS: 30.0000 mg | ORAL_TABLET | Freq: Every day | ORAL | Status: DC

## 2014-01-09 ENCOUNTER — Ambulatory Visit (INDEPENDENT_AMBULATORY_CARE_PROVIDER_SITE_OTHER): Payer: Medicare HMO | Admitting: Cardiology

## 2014-01-09 ENCOUNTER — Encounter: Payer: Self-pay | Admitting: Cardiology

## 2014-01-09 VITALS — BP 102/63 | HR 80 | Ht 66.0 in | Wt 149.0 lb

## 2014-01-09 DIAGNOSIS — I2510 Atherosclerotic heart disease of native coronary artery without angina pectoris: Secondary | ICD-10-CM

## 2014-01-09 NOTE — Patient Instructions (Signed)
The current medical regimen is effective;  continue present plan and medications.  Follow up in 2 years with Dr Percival Spanish in [place].  You will receive a letter in the mail 2 months before you are due.  Please call us when you receive this letter to schedule your follow up appointment.

## 2014-01-09 NOTE — Progress Notes (Signed)
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 He had his last catheterization in 2010 with non obstructive disease. He has had no cardiovascular symptoms since then.  The patient denies any new symptoms such as chest discomfort, neck or arm discomfort. There has been no new shortness of breath, PND or orthopnea. There have been no reported palpitations, presyncope or syncope.  He walks with a cane and is limited somewhat by MS.  He reports daytime weakness that he wakes up with on some days.    No Known Allergies  Current Outpatient Prescriptions  Medication Sig Dispense Refill  . amLODipine (NORVASC) 2.5 MG tablet Take 1 tablet (2.5 mg total) by mouth every morning.  90 tablet  1  . aspirin (LONGS ADULT LOW STRENGTH ASA) 81 MG EC tablet Take 81 mg by mouth daily.       . cholecalciferol (VITAMIN D) 1000 UNITS tablet Take 1,000 Units by mouth daily.      . fish oil-omega-3 fatty acids 1000 MG capsule Take 2 g by mouth 2 (two) times daily.      . interferon beta-1b (BETASERON) 0.3 MG injection Inject 0.25 mg into the skin every other day.       . isosorbide mononitrate (IMDUR) 60 MG 24 hr tablet Take 1/2 tablet every day  15 tablet  2  . lisinopril (PRINIVIL,ZESTRIL) 10 MG tablet Take 1 tablet (10 mg total) by mouth daily.  30 tablet  3  . metFORMIN (GLUCOPHAGE) 500 MG tablet Take 2 tablets (1,000 mg total) by mouth 2 (two) times daily with a meal.  120 tablet  0  . omeprazole (PRILOSEC) 20 MG capsule Take 20 mg by mouth daily.      . pioglitazone (ACTOS) 30 MG tablet Take 1 tablet (30 mg total) by mouth daily.  30 tablet  0  . rosuvastatin (CRESTOR) 10 MG tablet Take 1 tablet (10 mg total) by mouth daily.  35 tablet  0  . Tamsulosin HCl (FLOMAX) 0.4 MG CAPS Take 0.4 mg by mouth daily after supper.       . doxycycline (VIBRA-TABS) 100 MG tablet Take 1 tablet (100 mg total) by mouth 2 (two) times daily.  30 tablet  5  . [DISCONTINUED] atorvastatin (LIPITOR) 10 MG  tablet Take 10 mg by mouth daily.        . [DISCONTINUED] niacin-simvastatin (SIMCOR) 500-20 MG 24 hr tablet Take 1 tablet by mouth at bedtime.         No current facility-administered medications for this visit.    Past Medical History  Diagnosis Date  . NIDDM (non-insulin dependent diabetes mellitus)   . BPH associated with nocturia   . Hyperlipidemia   . Hypertension   . Acquired leg length discrepancy   . Hx of adenomatous colonic polyps   . Rosacea   . GERD (gastroesophageal reflux disease)   . Multiple sclerosis DX  10/10---  NEUROLOGIST  DR Delfino Lovett [facility] ([place])    RIGHT SIDE AFFECTED MORE W/ WEAKNESS- - USES CANE  . Peyronie's disease   . History of nephrolithiasis 2009  . H/O hiatal hernia   . Right ureteral stone   . Bladder stone   . CAD (coronary artery disease) CARDIOLOGIST- DR Kymoni Monday--  LAST VISIT 09-27-2011 IN [facility]    Non obstructive Cath 2010-  HX CORONARY SPASM 2004  . Renal stone RIGHT  . RBBB   . Neuromuscular disorder     MS    Past  Surgical History  Procedure Laterality Date  . Extracorporeal shock wave lithotripsy  09-05-2010    RIGHT  . Nesbit procedure  10-17-2004    CORRECTION OF PENILE ANGULATION (PEYRONIES DISEASE)  . Cardiac catheterization  02-26-2009 ---  DR Ida Rogue    NONOBSTRUCTIVE CAD/ 50% DISTAL RCA/ 30% LAD / NORMAL LVF  . Cystoscopy with litholapaxy  10/30/2011    Procedure: CYSTOSCOPY WITH LITHOLAPAXY;  Surgeon: Claybon Jabs, MD;  Location: [facility];  Service: Urology;  Laterality: N/A;  . Cystoscopy w/ retrogrades  10/30/2011    Procedure: CYSTOSCOPY WITH RETROGRADE PYELOGRAM;  Surgeon: Claybon Jabs, MD;  Location: [facility];  Service: Urology;  Laterality: Right;    ROS:  As stated in the HPI and negative for all other systems.  PHYSICAL EXAM BP 102/63  Pulse 80  Ht 5\' 6"  (1.676 m)  Wt 149 lb (67.586 kg)  BMI 24.06 kg/m2 GENERAL:  Well appearing HEENT:  Pupils equal round  and reactive, fundi not visualized, dentures NECK:  No jugular venous distention, waveform within normal limits, carotid upstroke brisk and symmetric, no bruits, no thyromegaly LUNGS:  Clear to auscultation bilaterally HEART:  PMI not displaced or sustained,S1 and S2 within normal limits, no S3, no S4, no clicks, no rubs, soft apical systolic nonradiating early peaking and  murmur ABD:  Flat, positive bowel sounds normal in frequency in pitch, no bruits, no rebound, no guarding, no midline pulsatile mass, no hepatomegaly, no splenomegaly EXT:  2 plus pulses throughout, no edema, no cyanosis no clubbing NEURO:  Cranial nerves II through XII grossly intact, grossly strength reduced bilaterally in lower extremities    EKG: Sinus rhythm, rate 78, right bundle branch block, no acute ST-T wave changes.  No changes.  01/09/2014  ASSESSMENT AND PLAN  CAD:  The patient has no new sypmtoms.  No further cardiovascular testing is indicated.  We will continue with aggressive risk reduction and meds as listed.  DYSLIPIDEMIA:  He recently restarted Crestor.  Plan per Redge Gainer, MD  HTN :  The blood pressure is at target. No change in medications is indicated. We will continue with therapeutic lifestyle changes (TLC).  TOBACCO ABUSE:  We talked again about stopping tobacco.

## 2014-01-26 ENCOUNTER — Other Ambulatory Visit: Payer: Self-pay | Admitting: *Deleted

## 2014-01-26 MED ORDER — LISINOPRIL 10 MG PO TABS: 10.0000 mg | ORAL_TABLET | Freq: Every day | ORAL | Status: DC

## 2014-01-28 ENCOUNTER — Encounter: Payer: Self-pay | Admitting: Family Medicine

## 2014-01-28 ENCOUNTER — Ambulatory Visit (INDEPENDENT_AMBULATORY_CARE_PROVIDER_SITE_OTHER): Payer: Medicare HMO | Admitting: Family Medicine

## 2014-01-28 ENCOUNTER — Other Ambulatory Visit: Payer: Self-pay | Admitting: *Deleted

## 2014-01-28 ENCOUNTER — Ambulatory Visit (INDEPENDENT_AMBULATORY_CARE_PROVIDER_SITE_OTHER): Payer: Medicare HMO

## 2014-01-28 VITALS — BP 121/65 | HR 83 | Temp 98.4°F | Ht 66.0 in | Wt 156.0 lb

## 2014-01-28 DIAGNOSIS — E119 Type 2 diabetes mellitus without complications: Secondary | ICD-10-CM

## 2014-01-28 DIAGNOSIS — G35D Multiple sclerosis, unspecified: Secondary | ICD-10-CM

## 2014-01-28 DIAGNOSIS — Z0000 Encounter for general adult medical examination without abnormal findings: Secondary | ICD-10-CM

## 2014-01-28 DIAGNOSIS — G609 Hereditary and idiopathic neuropathy, unspecified: Secondary | ICD-10-CM

## 2014-01-28 DIAGNOSIS — G35 Multiple sclerosis: Secondary | ICD-10-CM

## 2014-01-28 DIAGNOSIS — N400 Enlarged prostate without lower urinary tract symptoms: Secondary | ICD-10-CM

## 2014-01-28 DIAGNOSIS — E785 Hyperlipidemia, unspecified: Secondary | ICD-10-CM

## 2014-01-28 DIAGNOSIS — I10 Essential (primary) hypertension: Secondary | ICD-10-CM

## 2014-01-28 DIAGNOSIS — N200 Calculus of kidney: Secondary | ICD-10-CM

## 2014-01-28 DIAGNOSIS — E559 Vitamin D deficiency, unspecified: Secondary | ICD-10-CM

## 2014-01-28 DIAGNOSIS — G629 Polyneuropathy, unspecified: Secondary | ICD-10-CM

## 2014-01-28 LAB — POCT UA - MICROSCOPIC ONLY
CRYSTALS, UR, HPF, POC: NEGATIVE
Casts, Ur, LPF, POC: NEGATIVE
Mucus, UA: NEGATIVE
YEAST UA: NEGATIVE

## 2014-01-28 LAB — POCT CBC
GRANULOCYTE PERCENT: 79.7 % (ref 37–80)
HEMATOCRIT: 33 % — AB (ref 43.5–53.7)
Hemoglobin: 11.3 g/dL — AB (ref 14.1–18.1)
Lymph, poc: 0.9 (ref 0.6–3.4)
MCH, POC: 28.4 pg (ref 27–31.2)
MCHC: 34.2 g/dL (ref 31.8–35.4)
MCV: 83 fL (ref 80–97)
MPV: 7.7 fL (ref 0–99.8)
POC Granulocyte: 5.7 (ref 2–6.9)
POC LYMPH %: 12.2 % (ref 10–50)
Platelet Count, POC: 160 10*3/uL (ref 142–424)
RBC: 4 M/uL — AB (ref 4.69–6.13)
RDW, POC: 16.4 %
WBC: 7.1 10*3/uL (ref 4.6–10.2)

## 2014-01-28 LAB — POCT URINALYSIS DIPSTICK
Bilirubin, UA: NEGATIVE
GLUCOSE UA: NEGATIVE
Ketones, UA: NEGATIVE
NITRITE UA: POSITIVE
Spec Grav, UA: 1.025
UROBILINOGEN UA: NEGATIVE
pH, UA: 5

## 2014-01-28 LAB — POCT UA - MICROALBUMIN: MICROALBUMIN (UR) POC: NEGATIVE mg/L

## 2014-01-28 LAB — POCT GLYCOSYLATED HEMOGLOBIN (HGB A1C)

## 2014-01-28 MED ORDER — SULFAMETHOXAZOLE-TMP DS 800-160 MG PO TABS: 1.0000 | ORAL_TABLET | Freq: Two times a day (BID) | ORAL | Status: DC

## 2014-01-28 NOTE — Addendum Note (Signed)
Addended by: Pollyann Kennedy F on: 01/28/2014 11:34 AM   Modules accepted: Orders

## 2014-01-28 NOTE — Patient Instructions (Addendum)
Continue current medications. Continue good therapeutic lifestyle changes which include good diet and exercise. Fall precautions discussed with patient. If an FOBT was given today- please return it to our front desk. If you are over 64 years old - you may need Prevnar 79 or the adult Pneumonia vaccine.  Continue her followup visits with neurologist Debrox ear drops--- will help with the left ear cerumen Drink plenty of fluids Forms will be completed for shoes and brace for leg Check with your insurance regarding tDap shot Call you with the lab work once those results are available Do not forget to get your eye exam

## 2014-01-28 NOTE — Progress Notes (Addendum)
Subjective:    [name], male    DOB: 06/28/1951, 63 y.o.   MRN: [id number]  HPI Pt here for follow up and management of chronic medical problems. The patient is here for his annual exam. He sees a neurologist in [facility]. This is for his MS. He also has a special forms needed to be filled out for his shoes for his feet . The patient will get a chest x-ray and lab work today. He'll also get a urine microalbumin. He is due to have a Tdap . And this will be checked by the patient with his insurance for cost.        Patient Active Problem List   Diagnosis Date Noted  . Multiple sclerosis 01/06/2013  . Ureteral calculus, right 10/30/2011  . Bladder calculus 10/30/2011  . Tobacco use 09/27/2011  . CORONARY ATHEROSCLEROSIS, NATIVE VESSEL 10/06/2009  . DM 10/01/2009  . DYSLIPIDEMIA 10/01/2009  . HYPERTENSION 10/01/2009   Outpatient Encounter Prescriptions as of 01/28/2014  Medication Sig  . amLODipine (NORVASC) 2.5 MG tablet Take 1 tablet (2.5 mg total) by mouth every morning.  Marland Kitchen aspirin (LONGS ADULT LOW STRENGTH ASA) 81 MG EC tablet Take 81 mg by mouth daily.   . cholecalciferol (VITAMIN D) 1000 UNITS tablet Take 1,000 Units by mouth daily.  Marland Kitchen doxycycline (VIBRA-TABS) 100 MG tablet Take 1 tablet (100 mg total) by mouth 2 (two) times daily.  . fish oil-omega-3 fatty acids 1000 MG capsule Take 2 g by mouth 2 (two) times daily.  . interferon beta-1b (BETASERON) 0.3 MG injection Inject 0.25 mg into the skin every other day.   . isosorbide mononitrate (IMDUR) 60 MG 24 hr tablet Take 1/2 tablet every day  . lisinopril (PRINIVIL,ZESTRIL) 10 MG tablet Take 1 tablet (10 mg total) by mouth daily.  . metFORMIN (GLUCOPHAGE) 500 MG tablet Take 2 tablets (1,000 mg total) by mouth 2 (two) times daily with a meal.  . omeprazole (PRILOSEC) 20 MG capsule Take 20 mg by mouth daily.  . pioglitazone (ACTOS) 30 MG tablet Take 1 tablet (30 mg total) by mouth daily.  . rosuvastatin  (CRESTOR) 10 MG tablet Take 1 tablet (10 mg total) by mouth daily.  . Tamsulosin HCl (FLOMAX) 0.4 MG CAPS Take 0.4 mg by mouth daily after supper.     Review of Systems  Constitutional: Negative.   HENT: Negative.   Eyes: Negative.   Respiratory: Negative.   Cardiovascular: Negative.   Gastrointestinal: Negative.   Endocrine: Negative.   Genitourinary: Negative.   Musculoskeletal: Negative.   Skin: Negative.   Allergic/Immunologic: Negative.   Neurological: Negative.   Hematological: Negative.   Psychiatric/Behavioral: Negative.        Objective:   Physical Exam  Nursing note and vitals reviewed. Constitutional: He is oriented to person, place, and time. He appears well-developed and well-nourished. No distress.  Pleasant and cooperative and alert and using a cane for ambulation 2 to right lower extremity weakness  HENT:  Head: Normocephalic and atraumatic.  Right Ear: External ear normal.  Mouth/Throat: Oropharynx is clear and moist. No oropharyngeal exudate.  Minimal nasal congestion. Left ear cerumen  Eyes: Conjunctivae and EOM are normal. Pupils are equal, round, and reactive to light. Right eye exhibits no discharge. Left eye exhibits no discharge. No scleral icterus.  Neck: Normal range of motion. Neck supple. No thyromegaly present.  No carotid bruit  Cardiovascular: Normal rate, regular rhythm, normal heart sounds and intact distal pulses.  No murmur heard. At 72 per minute  Pulmonary/Chest: Effort normal and breath sounds normal. No respiratory distress. He has no wheezes. He has no rales. He exhibits no tenderness.  Abdominal: Soft. Bowel sounds are normal. He exhibits no mass. There is no tenderness. There is no rebound and no guarding.  No abdominal bruits or inguinal nodes  Genitourinary: Rectum normal and penis normal. No penile tenderness.  The prostate was minimally enlarged without any lumps or masses. The rectum had no masses. The external genitalia were  normal and there was no inguinal hernia bilaterally.  Musculoskeletal: Normal range of motion. He exhibits no edema and no tenderness.  Range of motion is limited especially in the distal right lower extremity . He has decreased movement of the toes and decreased ability to dorsiflex and extend his right foot. The right foot was definitely weaker and more flaccid and not the same appearance as the left foot  Lymphadenopathy:    He has no cervical adenopathy.  Neurological: He is alert and oriented to person, place, and time. He has normal reflexes. No cranial nerve deficit.  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 No rash noted.  Psychiatric: He has a normal mood and affect. His behavior is normal. Judgment and thought content normal.   BP 121/65  Pulse 83  Temp(Src) 98.4 F (36.9 C) (Oral)  Ht 5' 6"  (1.676 m)  Wt 156 lb (70.761 kg)  BMI 25.19 kg/m2        Assessment & Plan:  1. DM - POCT CBC - POCT glycosylated hemoglobin (Hb A1C) - POCT UA - Microalbumin  2. HYPERTENSION - POCT CBC - BMP8+EGFR - Hepatic function panel - DG Chest 2 View; Future  3. DYSLIPIDEMIA - POCT CBC - NMR, lipoprofile  4. Annual physical exam - POCT CBC - POCT UA - Microscopic Only - POCT urinalysis dipstick - PSA, total and free - DG Chest 2 View; Future  5. Vitamin D deficiency - POCT CBC - Vit D  25 hydroxy (rtn osteoporosis monitoring)  6. BPH (benign prostatic hyperplasia) - POCT UA - Microscopic Only - POCT urinalysis dipstick - PSA, total and free  7. Nephrolithiasis  8. Multiple sclerosis  No orders of the defined types were placed in this encounter.   Patient Instructions  Continue current medications. Continue good therapeutic lifestyle changes which include good diet and exercise. Fall precautions discussed with patient. If an FOBT was given today- please return it to our  front desk. If you are over 26 years old - you may need Prevnar 85 or the adult Pneumonia vaccine.  Continue her followup visits with neurologist Debrox ear drops--- will help with the left ear cerumen Drink plenty of fluids Forms will be completed for shoes and brace for leg Check with your insurance regarding tDap shot Call you with the lab work once those results are available   Arrie Senate MD

## 2014-01-29 ENCOUNTER — Telehealth: Payer: Self-pay

## 2014-01-29 ENCOUNTER — Telehealth: Payer: Self-pay | Admitting: Family Medicine

## 2014-01-29 LAB — BMP8+EGFR
BUN/Creatinine Ratio: 25 — ABNORMAL HIGH (ref 10–22)
BUN: 25 mg/dL (ref 8–27)
CHLORIDE: 102 mmol/L (ref 97–108)
CO2: 23 mmol/L (ref 18–29)
Calcium: 9.9 mg/dL (ref 8.6–10.2)
Creatinine, Ser: 1 mg/dL (ref 0.76–1.27)
GFR calc Af Amer: 92 mL/min/{1.73_m2} (ref >59)
GFR calc non Af Amer: 79 mL/min/{1.73_m2} (ref >59)
Glucose: 108 mg/dL — ABNORMAL HIGH (ref 65–99)
Potassium: 4.6 mmol/L (ref 3.5–5.2)
Sodium: 141 mmol/L (ref 134–144)

## 2014-01-29 LAB — HEPATIC FUNCTION PANEL
ALBUMIN: 4.6 g/dL (ref 3.6–4.8)
ALK PHOS: 57 IU/L (ref 39–117)
ALT: 21 IU/L (ref 0–44)
AST: 22 IU/L (ref 0–40)
Bilirubin, Direct: 0.09 mg/dL (ref 0.00–0.40)
Total Bilirubin: 0.4 mg/dL (ref 0.0–1.2)
Total Protein: 7.2 g/dL (ref 6.0–8.5)

## 2014-01-29 LAB — NMR, LIPOPROFILE
Cholesterol: 126 mg/dL (ref 100–199)
HDL CHOLESTEROL BY NMR: 31 mg/dL — AB (ref >39)
HDL PARTICLE NUMBER: 26.4 umol/L — AB (ref >=30.5)
LDL Particle Number: 1149 nmol/L — ABNORMAL HIGH (ref <1000)
LDL Size: 20.4 nm (ref >20.5)
LDLC SERPL CALC-MCNC: 77 mg/dL (ref 0–99)
LP-IR Score: 59 — ABNORMAL HIGH (ref <=45)
Small LDL Particle Number: 704 nmol/L — ABNORMAL HIGH (ref <=527)
Triglycerides by NMR: 90 mg/dL (ref 0–149)

## 2014-01-29 LAB — VITAMIN D 25 HYDROXY (VIT D DEFICIENCY, FRACTURES): VIT D 25 HYDROXY: 44.9 ng/mL (ref 30.0–100.0)

## 2014-01-29 LAB — PSA, TOTAL AND FREE
PSA, Free Pct: 14.9 %
PSA, Free: 0.82 ng/mL
PSA: 5.5 ng/mL — AB (ref 0.0–4.0)

## 2014-01-29 NOTE — Telephone Encounter (Signed)
Patient aware.

## 2014-01-29 NOTE — Telephone Encounter (Signed)
Message copied by Koren Bound on Thu Jan 29, 2014  2:34 PM ------      Message from: Chipper Herb      Created: Wed Jan 28, 2014  5:26 PM       As per radiology report ------

## 2014-01-29 NOTE — Telephone Encounter (Signed)
Pt aware of CXR results.

## 2014-01-30 LAB — URINE CULTURE

## 2014-02-02 ENCOUNTER — Other Ambulatory Visit: Payer: Self-pay | Admitting: Family Medicine

## 2014-02-02 ENCOUNTER — Telehealth: Payer: Self-pay | Admitting: Family Medicine

## 2014-02-02 NOTE — Telephone Encounter (Signed)
Patient aware of labs.  

## 2014-02-02 NOTE — Telephone Encounter (Signed)
Message copied by Waverly Ferrari on Mon Feb 02, 2014 11:00 AM ------      Message from: Chipper Herb      Created: Fri Jan 30, 2014  3:56 PM       The bacteria that was growing in the urine culture is sensitive to the sulfa that has been started. Let the patient know this. Less than 90 she continued to take and complete the sulfa medication. We should do a repeat urinalysis in a couple weeks. ------

## 2014-02-16 ENCOUNTER — Ambulatory Visit (INDEPENDENT_AMBULATORY_CARE_PROVIDER_SITE_OTHER): Payer: Medicare HMO | Admitting: Family Medicine

## 2014-02-16 ENCOUNTER — Encounter: Payer: Self-pay | Admitting: Family Medicine

## 2014-02-16 VITALS — BP 103/58 | HR 73 | Temp 99.3°F | Ht 66.0 in | Wt 158.0 lb

## 2014-02-16 DIAGNOSIS — R319 Hematuria, unspecified: Secondary | ICD-10-CM

## 2014-02-16 DIAGNOSIS — Z23 Encounter for immunization: Secondary | ICD-10-CM

## 2014-02-16 DIAGNOSIS — R109 Unspecified abdominal pain: Secondary | ICD-10-CM

## 2014-02-16 DIAGNOSIS — N390 Urinary tract infection, site not specified: Secondary | ICD-10-CM

## 2014-02-16 DIAGNOSIS — N200 Calculus of kidney: Secondary | ICD-10-CM

## 2014-02-16 DIAGNOSIS — R972 Elevated prostate specific antigen [PSA]: Secondary | ICD-10-CM

## 2014-02-16 LAB — POCT URINALYSIS DIPSTICK
Bilirubin, UA: NEGATIVE
Glucose, UA: NEGATIVE
Ketones, UA: NEGATIVE
LEUKOCYTES UA: NEGATIVE
NITRITE UA: NEGATIVE
PH UA: 5
Spec Grav, UA: >=1.03
Urobilinogen, UA: NEGATIVE

## 2014-02-16 LAB — POCT UA - MICROSCOPIC ONLY
Bacteria, U Microscopic: NEGATIVE
CASTS, UR, LPF, POC: NEGATIVE
Crystals, Ur, HPF, POC: NEGATIVE
YEAST UA: NEGATIVE

## 2014-02-16 MED ORDER — SULFAMETHOXAZOLE-TMP DS 800-160 MG PO TABS: 1.0000 | ORAL_TABLET | Freq: Two times a day (BID) | ORAL | Status: DC

## 2014-02-16 NOTE — Addendum Note (Signed)
Addended by: Zannie Cove on: 02/16/2014 03:08 PM   Modules accepted: Orders

## 2014-02-16 NOTE — Progress Notes (Signed)
Subjective:    [name], male    DOB: 1950/08/31, 63 y.o.   MRN: [id number]  HPI Patient here today for 2 week follow up for UTI. He is still having right side pain. Another urine specimen was left today for rccheck. The only difference from 2 weeks ago is that he is not going to the bathroom as frequently. He still has some burning. Retrospectively the patient also notices that his urine has been darker than usual.       Patient Active Problem List   Diagnosis Date Noted  . Multiple sclerosis 01/06/2013  . Ureteral calculus, right 10/30/2011  . Bladder calculus 10/30/2011  . Tobacco use 09/27/2011  . CORONARY ATHEROSCLEROSIS, NATIVE VESSEL 10/06/2009  . DM 10/01/2009  . DYSLIPIDEMIA 10/01/2009  . HYPERTENSION 10/01/2009   Outpatient Encounter Prescriptions as of 02/16/2014  Medication Sig  . amLODipine (NORVASC) 2.5 MG tablet Take 1 tablet (2.5 mg total) by mouth every morning.  Marland Kitchen aspirin (LONGS ADULT LOW STRENGTH ASA) 81 MG EC tablet Take 81 mg by mouth daily.   . cholecalciferol (VITAMIN D) 1000 UNITS tablet Take 1,000 Units by mouth daily.  . fish oil-omega-3 fatty acids 1000 MG capsule Take 2 g by mouth 2 (two) times daily.  . interferon beta-1b (BETASERON) 0.3 MG injection Inject 0.25 mg into the skin every other day.   . isosorbide mononitrate (IMDUR) 60 MG 24 hr tablet Take 1/2 tablet every day  . lisinopril (PRINIVIL,ZESTRIL) 10 MG tablet Take 1 tablet (10 mg total) by mouth daily.  . metFORMIN (GLUCOPHAGE) 500 MG tablet TAKE TWO TABLETS BY MOUTH TWICE DAILY WITH A MEAL  . omeprazole (PRILOSEC) 20 MG capsule Take 20 mg by mouth daily.  . pioglitazone (ACTOS) 30 MG tablet TAKE ONE TABLET BY MOUTH EVERY DAY  . rosuvastatin (CRESTOR) 10 MG tablet Take 1 tablet (10 mg total) by mouth daily.  . Tamsulosin HCl (FLOMAX) 0.4 MG CAPS Take 0.4 mg by mouth daily after supper.   . [DISCONTINUED] doxycycline (VIBRA-TABS) 100 MG tablet Take 1 tablet (100 mg total)  by mouth 2 (two) times daily.  . [DISCONTINUED] sulfamethoxazole-trimethoprim (BACTRIM DS) 800-160 MG per tablet Take 1 tablet by mouth 2 (two) times daily.    Review of Systems  Constitutional: Negative.   HENT: Negative.   Eyes: Negative.   Respiratory: Negative.   Cardiovascular: Negative.   Gastrointestinal: Negative.   Endocrine: Negative.   Genitourinary: Negative.   Musculoskeletal: Positive for myalgias (right side pain).  Skin: Negative.   Allergic/Immunologic: Negative.   Neurological: Negative.   Hematological: Negative.   Psychiatric/Behavioral: Negative.        Objective:   Physical Exam  Vitals reviewed. Constitutional: He is oriented to person, place, and time. He appears well-developed and well-nourished. No distress.  HENT:  Head: Normocephalic.  Eyes: Conjunctivae and EOM are normal. Pupils are equal, round, and reactive to light. Right eye exhibits no discharge.  Neck: Normal range of motion.  Abdominal: Soft. He exhibits no distension and no mass. There is no tenderness. There is no rebound and no guarding.  Musculoskeletal: He exhibits no edema and no tenderness.  Range of motion is limited due to the effects of a mass. Patient uses a cane and has distorted gait  Neurological: He is alert and oriented to person, place, and time.  Skin: Skin is warm and dry. No rash noted.  Psychiatric: He has a normal mood and affect. His behavior is normal. Judgment and  thought content normal.   BP 103/58  Pulse 73  Temp(Src) 99.3 F (37.4 C) (Oral)  Ht 5\' 6"  (1.676 m)  Wt 158 lb (71.668 kg)  BMI 25.51 kg/m2  Results for orders placed in visit on 02/16/14  POCT URINALYSIS DIPSTICK      Result Value Ref Range   Color, UA gold     Clarity, UA clear     Glucose, UA neg     Bilirubin, UA neg     Ketones, UA neg     Spec Grav, UA >=1.030     Blood, UA trace     pH, UA 5.0     Protein, UA 2+     Urobilinogen, UA negative     Nitrite, UA neg     Leukocytes, UA  Negative    POCT UA - MICROSCOPIC ONLY      Result Value Ref Range   WBC, Ur, HPF, POC rare     RBC, urine, microscopic 1-3     Bacteria, U Microscopic neg     Mucus, UA mod     Epithelial cells, urine per micros rare     Crystals, Ur, HPF, POC neg     Casts, Ur, LPF, POC neg     Yeast, UA neg           Assessment & Plan:  1. Urinary tract infection, site not specified - POCT urinalysis dipstick - POCT UA - Microscopic Only - Urine culture -Continue Septra DS until seen by the urology  2. Elevated PSA -Followup with urologist  3. Hematuria -Followup with urologist  4. Right flank pain  5. Nephrolithiasis  Patient Instructions  Keep followup appointment with the urologist this Thursday Please make him aware that your PSA was elevated from the previous check--- it went from 3.37-5.5. The PSA will need to be rechecked Continue to take antibiotics until you see the urologist Continue to drink plenty of fluids, i.e. lemonade as he directed   Arrie Senate MD

## 2014-02-16 NOTE — Patient Instructions (Signed)
Keep followup appointment with the urologist this Thursday Please make him aware that your PSA was elevated from the previous check--- it went from 3.37-5.5. The PSA will need to be rechecked Continue to take antibiotics until you see the urologist Continue to drink plenty of fluids, i.e. lemonade as he directed

## 2014-02-18 LAB — URINE CULTURE

## 2014-02-19 ENCOUNTER — Other Ambulatory Visit: Payer: Self-pay | Admitting: Family Medicine

## 2014-03-04 ENCOUNTER — Other Ambulatory Visit: Payer: Self-pay | Admitting: Family Medicine

## 2014-05-04 ENCOUNTER — Telehealth: Payer: Self-pay | Admitting: Family Medicine

## 2014-05-05 NOTE — Telephone Encounter (Signed)
This was taken care of 10/19 by [facility]

## 2014-05-26 ENCOUNTER — Ambulatory Visit: Payer: Medicare HMO | Admitting: Family Medicine

## 2014-05-27 ENCOUNTER — Ambulatory Visit (INDEPENDENT_AMBULATORY_CARE_PROVIDER_SITE_OTHER): Payer: Medicare HMO | Admitting: Family Medicine

## 2014-05-27 ENCOUNTER — Encounter: Payer: Self-pay | Admitting: Family Medicine

## 2014-05-27 VITALS — BP 143/78 | HR 66 | Temp 96.9°F | Ht 66.0 in | Wt 156.0 lb

## 2014-05-27 DIAGNOSIS — E1169 Type 2 diabetes mellitus with other specified complication: Secondary | ICD-10-CM

## 2014-05-27 DIAGNOSIS — R972 Elevated prostate specific antigen [PSA]: Secondary | ICD-10-CM

## 2014-05-27 DIAGNOSIS — N400 Enlarged prostate without lower urinary tract symptoms: Secondary | ICD-10-CM

## 2014-05-27 DIAGNOSIS — E785 Hyperlipidemia, unspecified: Secondary | ICD-10-CM

## 2014-05-27 DIAGNOSIS — E118 Type 2 diabetes mellitus with unspecified complications: Secondary | ICD-10-CM

## 2014-05-27 DIAGNOSIS — Z23 Encounter for immunization: Secondary | ICD-10-CM

## 2014-05-27 DIAGNOSIS — I10 Essential (primary) hypertension: Secondary | ICD-10-CM

## 2014-05-27 DIAGNOSIS — E559 Vitamin D deficiency, unspecified: Secondary | ICD-10-CM

## 2014-05-27 DIAGNOSIS — G35 Multiple sclerosis: Secondary | ICD-10-CM

## 2014-05-27 DIAGNOSIS — E119 Type 2 diabetes mellitus without complications: Secondary | ICD-10-CM

## 2014-05-27 LAB — POCT CBC
Granulocyte percent: 75.7 %G (ref 37–80)
HCT, POC: 37 % — AB (ref 43.5–53.7)
HEMOGLOBIN: 12.6 g/dL — AB (ref 14.1–18.1)
Lymph, poc: 0.8 (ref 0.6–3.4)
MCH, POC: 28 pg (ref 27–31.2)
MCHC: 34 g/dL (ref 31.8–35.4)
MCV: 82.5 fL (ref 80–97)
MPV: 7.6 fL (ref 0–99.8)
POC Granulocyte: 4 (ref 2–6.9)
POC LYMPH PERCENT: 14.7 %L (ref 10–50)
Platelet Count, POC: 186 10*3/uL (ref 142–424)
RBC: 4.5 M/uL — AB (ref 4.69–6.13)
RDW, POC: 14.2 %
WBC: 5.3 10*3/uL (ref 4.6–10.2)

## 2014-05-27 LAB — POCT GLYCOSYLATED HEMOGLOBIN (HGB A1C): Hemoglobin A1C: 5.9

## 2014-05-27 MED ORDER — ROSUVASTATIN CALCIUM 20 MG PO TABS: 20.0000 mg | ORAL_TABLET | Freq: Every day | ORAL | Status: DC

## 2014-05-27 NOTE — Patient Instructions (Addendum)
Continue current medications. Continue good therapeutic lifestyle changes which include good diet and exercise. Fall precautions discussed with patient. If an FOBT was given today- please return it to our front desk. If you are over 64 years old - you may need Prevnar 30 or the adult Pneumonia vaccine.  Flu Shots will be available at our office starting mid- September. Please call and schedule a FLU CLINIC APPOINTMENT.   Keep follow-up appointments with cardiology, neurology, and urology. Try to exercise as much as possible and at the same time be careful and do not put herself at risk for falling Return the FOBT We will call you with your lab work as soon as those results are available

## 2014-05-27 NOTE — Progress Notes (Signed)
Subjective:    [name], male    DOB: May 24, 1951, 63 y.o.   MRN: [id number]  HPI Pt here for follow up and management of chronic medical problems. The patient has a history of multiple sclerosis and is being followed by a physician in [facility]. He also has a history of coronary artery disease, hyperlipidemia, diabetes mellitus, and hypertension. He has no specific complaints today. He is also being followed by Dr. Karsten Ro because of his kidney stones. He also has an elevated PSA.he will see the urologist again in December. He sees the cardiologist every 2 years. He sees the neurologist every 6 months. He will get a flu shot today and is requesting Crestor and Zetia samples.         Patient Active Problem List   Diagnosis Date Noted  . Multiple sclerosis 01/06/2013  . Ureteral calculus, right 10/30/2011  . Bladder calculus 10/30/2011  . Tobacco use 09/27/2011  . CORONARY ATHEROSCLEROSIS, NATIVE VESSEL 10/06/2009  . DM 10/01/2009  . DYSLIPIDEMIA 10/01/2009  . HYPERTENSION 10/01/2009   Outpatient Encounter Prescriptions as of 05/27/2014  Medication Sig  . amLODipine (NORVASC) 2.5 MG tablet TAKE ONE TABLET BY MOUTH EVERY MORNING  . aspirin (LONGS ADULT LOW STRENGTH ASA) 81 MG EC tablet Take 81 mg by mouth daily.   . cholecalciferol (VITAMIN D) 1000 UNITS tablet Take 1,000 Units by mouth daily.  Marland Kitchen ezetimibe (ZETIA) 10 MG tablet Take 10 mg by mouth daily.  . fish oil-omega-3 fatty acids 1000 MG capsule Take 2 g by mouth 2 (two) times daily.  . interferon beta-1b (BETASERON) 0.3 MG injection Inject 0.25 mg into the skin every other day.   . isosorbide mononitrate (IMDUR) 60 MG 24 hr tablet TAKE ONE-HALF (1/2) TABLET BY MOUTH EVERY DAY  . lisinopril (PRINIVIL,ZESTRIL) 10 MG tablet Take 1 tablet (10 mg total) by mouth daily.  . metFORMIN (GLUCOPHAGE) 500 MG tablet TAKE TWO TABLETS BY MOUTH TWICE DAILY WITH A MEAL  . omeprazole (PRILOSEC) 20 MG capsule Take 20 mg by  mouth daily.  . pioglitazone (ACTOS) 30 MG tablet TAKE ONE TABLET BY MOUTH EVERY DAY  . rosuvastatin (CRESTOR) 10 MG tablet Take 1 tablet (10 mg total) by mouth daily.  . Tamsulosin HCl (FLOMAX) 0.4 MG CAPS Take 0.4 mg by mouth daily after supper.   . [DISCONTINUED] sulfamethoxazole-trimethoprim (BACTRIM DS) 800-160 MG per tablet Take 1 tablet by mouth 2 (two) times daily.    Review of Systems  Constitutional: Negative.   HENT: Negative.   Eyes: Negative.   Respiratory: Negative.   Cardiovascular: Negative.   Gastrointestinal: Negative.   Endocrine: Negative.   Genitourinary: Negative.   Musculoskeletal: Negative.   Skin: Negative.   Allergic/Immunologic: Negative.   Neurological: Negative.   Hematological: Negative.   Psychiatric/Behavioral: Negative.        Objective:   Physical Exam  Constitutional: He is oriented to person, place, and time. He appears well-developed and well-nourished. No distress.  HENT:  Head: Normocephalic and atraumatic.  Right Ear: External ear normal.  Left Ear: External ear normal.  Nose: Nose normal.  Mouth/Throat: Oropharynx is clear and moist. No oropharyngeal exudate.  Eyes: Conjunctivae and EOM are normal. Pupils are equal, round, and reactive to light. Right eye exhibits no discharge. Left eye exhibits no discharge. No scleral icterus.  Neck: Normal range of motion. Neck supple. No thyromegaly present.  No anterior cervical adenopathy  Cardiovascular: Normal rate, regular rhythm, normal heart sounds and intact  distal pulses.  Exam reveals no gallop and no friction rub.   No murmur heard. At 72/m  Pulmonary/Chest: Effort normal and breath sounds normal. No respiratory distress. He has no wheezes. He has no rales. He exhibits no tenderness.  No axillary adenopathy and lungs were clear anteriorly and posteriorly  Abdominal: Soft. Bowel sounds are normal. He exhibits no mass. There is no tenderness. There is no rebound and no guarding.  No  abdominal tenderness or inguinal adenopathy  Musculoskeletal: Normal range of motion. He exhibits no edema or tenderness.  The patient has gait weakness and wears a brace on his right foot because of the MS.  Lymphadenopathy:    He has no cervical adenopathy.  Neurological: He is alert and oriented to person, place, and time. He has normal reflexes. No cranial nerve deficit.  Skin: Skin is warm and dry. No rash noted. No erythema. No pallor.  Psychiatric: He has a normal mood and affect. His behavior is normal. Judgment and thought content normal.  Nursing note and vitals reviewed.  BP 143/78 mmHg  Pulse 66  Temp(Src) 96.9 F (36.1 C) (Oral)  Ht 5' 6"  (1.676 m)  Wt 156 lb (70.761 kg)  BMI 25.19 kg/m2        Assessment & Plan:  1. Elevated PSA -continue follow-up with urology - POCT CBC  2. Vitamin D deficiency - POCT CBC - Vit D  25 hydroxy (rtn osteoporosis monitoring)  3. BPH (benign prostatic hyperplasia) -continue follow-up with urology - POCT CBC  4. Multiple sclerosis -continue follow-up with neurology - POCT CBC  5. Essential hypertension - POCT CBC - BMP8+EGFR - Hepatic function panel - NMR, lipoprofile  6. Type 2 diabetes mellitus with complication - POCT CBC - POCT glycosylated hemoglobin (Hb A1C) - NMR, lipoprofile  7. Diabetes mellitus type 2 in nonobese  8. Hyperlipidemia associated with type 2 diabetes mellitus   Meds ordered this encounter  Medications  . ezetimibe (ZETIA) 10 MG tablet    Sig: Take 10 mg by mouth daily.  . rosuvastatin (CRESTOR) 20 MG tablet    Sig: Take 1 tablet (20 mg total) by mouth daily. As directed    Dispense:  90 tablet    Refill:  1   Patient Instructions  Continue current medications. Continue good therapeutic lifestyle changes which include good diet and exercise. Fall precautions discussed with patient. If an FOBT was given today- please return it to our front desk. If you are over 23 years old - you may  need Prevnar 17 or the adult Pneumonia vaccine.  Flu Shots will be available at our office starting mid- September. Please call and schedule a FLU CLINIC APPOINTMENT.   Keep follow-up appointments with cardiology, neurology, and urology. Try to exercise as much as possible and at the same time be careful and do not put herself at risk for falling Return the FOBT We will call you with your lab work as soon as those results are available   Arrie Senate MD

## 2014-05-28 ENCOUNTER — Encounter: Payer: Self-pay | Admitting: Family Medicine

## 2014-05-28 LAB — NMR, LIPOPROFILE
Cholesterol: 113 mg/dL (ref 100–199)
HDL Cholesterol by NMR: 33 mg/dL — ABNORMAL LOW (ref >39)
HDL Particle Number: 26.1 umol/L — ABNORMAL LOW (ref >=30.5)
LDL PARTICLE NUMBER: 926 nmol/L (ref <1000)
LDL SIZE: 19.9 nm (ref >20.5)
LDL-C: 66 mg/dL (ref 0–99)
LP-IR Score: 50 — ABNORMAL HIGH (ref <=45)
SMALL LDL PARTICLE NUMBER: 685 nmol/L — AB (ref <=527)
Triglycerides by NMR: 71 mg/dL (ref 0–149)

## 2014-05-28 LAB — BMP8+EGFR
BUN / CREAT RATIO: 18 (ref 10–22)
BUN: 17 mg/dL (ref 8–27)
CO2: 25 mmol/L (ref 18–29)
CREATININE: 0.96 mg/dL (ref 0.76–1.27)
Calcium: 10.1 mg/dL (ref 8.6–10.2)
Chloride: 102 mmol/L (ref 97–108)
GFR calc Af Amer: 96 mL/min/{1.73_m2} (ref >59)
GFR calc non Af Amer: 83 mL/min/{1.73_m2} (ref >59)
GLUCOSE: 116 mg/dL — AB (ref 65–99)
Potassium: 4.2 mmol/L (ref 3.5–5.2)
Sodium: 143 mmol/L (ref 134–144)

## 2014-05-28 LAB — HEPATIC FUNCTION PANEL
ALT: 21 IU/L (ref 0–44)
AST: 26 IU/L (ref 0–40)
Albumin: 4.9 g/dL — ABNORMAL HIGH (ref 3.6–4.8)
Alkaline Phosphatase: 55 IU/L (ref 39–117)
BILIRUBIN DIRECT: 0.14 mg/dL (ref 0.00–0.40)
BILIRUBIN TOTAL: 0.3 mg/dL (ref 0.0–1.2)
Total Protein: 7.3 g/dL (ref 6.0–8.5)

## 2014-05-28 LAB — VITAMIN D 25 HYDROXY (VIT D DEFICIENCY, FRACTURES): VIT D 25 HYDROXY: 53.6 ng/mL (ref 30.0–100.0)

## 2014-06-04 ENCOUNTER — Other Ambulatory Visit: Payer: Self-pay | Admitting: Family Medicine

## 2014-07-03 ENCOUNTER — Other Ambulatory Visit: Payer: Self-pay | Admitting: Family Medicine

## 2014-07-06 ENCOUNTER — Telehealth: Payer: Self-pay | Admitting: Family Medicine

## 2014-07-06 MED ORDER — GLUCOSE BLOOD VI STRP: ORAL_STRIP | Status: DC

## 2014-07-06 NOTE — Telephone Encounter (Signed)
Aware,script sent in for strips to test blood sugar twice daily.

## 2014-07-13 ENCOUNTER — Other Ambulatory Visit: Payer: Medicare HMO

## 2014-07-13 DIAGNOSIS — Z1212 Encounter for screening for malignant neoplasm of rectum: Secondary | ICD-10-CM

## 2014-07-13 NOTE — Progress Notes (Signed)
Lab only specimen drop off 

## 2014-07-15 LAB — FECAL OCCULT BLOOD, IMMUNOCHEMICAL: Fecal Occult Bld: NEGATIVE

## 2014-08-21 ENCOUNTER — Other Ambulatory Visit: Payer: Self-pay | Admitting: Family Medicine

## 2014-08-31 ENCOUNTER — Other Ambulatory Visit: Payer: Self-pay | Admitting: Family Medicine

## 2014-09-16 ENCOUNTER — Ambulatory Visit (INDEPENDENT_AMBULATORY_CARE_PROVIDER_SITE_OTHER): Payer: PPO | Admitting: Neurology

## 2014-09-16 ENCOUNTER — Encounter: Payer: Self-pay | Admitting: Neurology

## 2014-09-16 VITALS — BP 122/68 | HR 64 | Resp 14 | Ht 66.0 in | Wt 212.4 lb

## 2014-09-16 DIAGNOSIS — G35 Multiple sclerosis: Secondary | ICD-10-CM | POA: Diagnosis not present

## 2014-09-16 DIAGNOSIS — R261 Paralytic gait: Secondary | ICD-10-CM | POA: Diagnosis not present

## 2014-09-16 DIAGNOSIS — Z79899 Other long term (current) drug therapy: Secondary | ICD-10-CM

## 2014-09-16 DIAGNOSIS — N329 Bladder disorder, unspecified: Secondary | ICD-10-CM | POA: Diagnosis not present

## 2014-09-16 MED ORDER — OXYBUTYNIN CHLORIDE 5 MG PO TABS: 5.0000 mg | ORAL_TABLET | Freq: Three times a day (TID) | ORAL | Status: DC

## 2014-09-16 NOTE — Progress Notes (Signed)
[facility]  PATIENT: Jacob Fowler DOB: 1951-04-16  REFERRING DOCTOR OR PCP:  Redge Gainer SOURCE: patient  _________________________________   HISTORICAL  CHIEF COMPLAINT:  Chief Complaint  Patient presents with  . Multiple Sclerosis    Sts. he tolerates Betaseron well, but would like to discuss occasional slow-healing inj. sites.  Sts. he was off of Betaseron for about a month due to cost--he now has funding thru [employer] and he restarted Betaseron last week.  Sts. gait/balance, fatigue have been worse over the last 6-8 mos.  Fatigue is worse from about noon on./fim    HISTORY OF PRESENT ILLNESS:  Jacob Fowler is a 64 year old man with multiple sclerosis.   For several years, he notes mild gait issues and he began to note significant difficulty with gait in January 2010.    Initially, he felt that his problems were with his back. He had some back x-rays. He was then referred to neurosurgery that noted that he had plaques in his spine more worse simple MS. He was referred to [facility] and had further testing including MRI of the brain and spinal tap. These were all consistent with multiple sclerosis and by November 2010 he had started Betaseron. He feels that he has been mostly stable over the past 5 years but has noted that his gait is a little worse than it was in 2010. About 2 or 3 years ago, he started using a cane.  Gait/strength/sensation: Since the onset of his MS he has had difficulty with his gait. His right leg is weaker and more spastic than the left leg. He denies any numbness in the legs. He works outdoors and has fallen a few times. Balance is reduced. He stumbles sometimes and he cannot catch himself.    The due to right foot drop, he has been using an AFO brace on the right leg since 2011.  He denies any significant numbness, weakness or clumsiness in the arms.  Bladder: He has had urinary frequency and urgency as well as some  urinary hesitancy. Tamsulosin helped the urinary hesitancy but he continues to have frequency and sometimes has to urinate every 30-60 minutes. He has to go several times every night. He does not recall being on a cholinergic agent for the bladder.  Vision: He denies any MS related visual problems. He does wear glasses for correction.  Fatigue/sleep: He notes that he usually starts every day feeling fairly energize. However, by lunchtime he is very tired physically but he denies much cognitive fatigue. He feels that the fatigue has worsened over the last year. He sleeps poorly for several reasons. The night after his Betaseron shots he usually has trouble falling and staying asleep. He also has to wake up multiple times use the bathroom. He does not snore much and his wife has not noted any apnea. He notes that after he takes his Betaseron shots that he is restless and has to straighten his legs out more to try to get comfortable.  Mood:   He denies any depression or anxiety. His wife notes he is sometimes very quiet and he is never tearful and is not irritable.  Cognitive: He feels his short-term memory is a little worse in the used to be. He denies any significant difficulty with word finding or processing speed.    REVIEW OF SYSTEMS: Constitutional: No fevers, chills, sweats, or change in appetite.  He notes fatigue and poor sleep Eyes: No visual changes, double vision, eye  pain Ear, nose and throat: No hearing loss, ear pain, nasal congestion, sore throat Cardiovascular: No chest pain, palpitations Respiratory: No shortness of breath at rest or with exertion.   No wheezes GastrointestinaI: No nausea, vomiting, diarrhea, abdominal pain, fecal incontinence Genitourinary: urinary frequency.  No nocturia. Musculoskeletal: No neck pain, back pain Integumentary: No rash, pruritus, skin lesions Neurological: as above Psychiatric: No depression at this time.  No anxiety Endocrine: No  palpitations, diaphoresis, change in appetite, change in weigh or increased thirst Hematologic/Lymphatic: No anemia, purpura, petechiae. Allergic/Immunologic: No itchy/runny eyes, nasal congestion, recent allergic reactions, rashes  ALLERGIES: No Known Allergies  HOME MEDICATIONS:  Current outpatient prescriptions:  .  amLODipine (NORVASC) 2.5 MG tablet, TAKE ONE TABLET BY MOUTH EVERY MORNING, Disp: 90 tablet, Rfl: 0 .  aspirin (LONGS ADULT LOW STRENGTH ASA) 81 MG EC tablet, Take 81 mg by mouth daily. , Disp: , Rfl:  .  cholecalciferol (VITAMIN D) 1000 UNITS tablet, Take 1,000 Units by mouth daily., Disp: , Rfl:  .  ezetimibe (ZETIA) 10 MG tablet, Take 10 mg by mouth daily., Disp: , Rfl:  .  fish oil-omega-3 fatty acids 1000 MG capsule, Take 2 g by mouth 2 (two) times daily., Disp: , Rfl:  .  glucose blood test strip, Use as instructed, Disp: 100 each, Rfl: 12 .  interferon beta-1b (BETASERON) 0.3 MG injection, Inject 0.25 mg into the skin every other day. , Disp: , Rfl:  .  isosorbide mononitrate (IMDUR) 60 MG 24 hr tablet, TAKE ONE-HALF (1/2) TABLET (30MG ) BY MOUTH EVERY DAY, Disp: 15 tablet, Rfl: 4 .  lisinopril (PRINIVIL,ZESTRIL) 10 MG tablet, Take 1 tablet (10 mg total) by mouth daily., Disp: 90 tablet, Rfl: 0 .  metFORMIN (GLUCOPHAGE) 500 MG tablet, TAKE TWO TABLETS (1000MG ) BY MOUTH TWICEDAILY WITH MEALS, Disp: 120 tablet, Rfl: 2 .  omeprazole (PRILOSEC) 20 MG capsule, Take 20 mg by mouth daily., Disp: , Rfl:  .  pioglitazone (ACTOS) 30 MG tablet, TAKE ONE TABLET BY MOUTH EVERY DAY, Disp: 30 tablet, Rfl: 2 .  rosuvastatin (CRESTOR) 10 MG tablet, Take 1 tablet (10 mg total) by mouth daily., Disp: 35 tablet, Rfl: 0 .  Tamsulosin HCl (FLOMAX) 0.4 MG CAPS, Take 0.4 mg by mouth daily after supper. , Disp: , Rfl:  .  lisinopril (PRINIVIL,ZESTRIL) 10 MG tablet, TAKE ONE TABLET BY MOUTH EVERY DAY (Patient not taking: Reported on 09/16/2014), Disp: 30 tablet, Rfl: 5 .  rosuvastatin (CRESTOR) 20  MG tablet, Take 1 tablet (20 mg total) by mouth daily. As directed (Patient not taking: Reported on 09/16/2014), Disp: 90 tablet, Rfl: 1 .  [DISCONTINUED] atorvastatin (LIPITOR) 10 MG tablet, Take 10 mg by mouth daily.  , Disp: , Rfl:  .  [DISCONTINUED] niacin-simvastatin (SIMCOR) 500-20 MG 24 hr tablet, Take 1 tablet by mouth at bedtime.  , Disp: , Rfl:   PAST MEDICAL HISTORY: Past Medical History  Diagnosis Date  . NIDDM (non-insulin dependent diabetes mellitus)   . BPH associated with nocturia   . Hyperlipidemia   . Hypertension   . Acquired leg length discrepancy   . Hx of adenomatous colonic polyps   . Rosacea   . GERD (gastroesophageal reflux disease)   . Multiple sclerosis DX  10/10---  NEUROLOGIST  DR Delfino Lovett [facility] ([place])    RIGHT SIDE AFFECTED MORE W/ WEAKNESS- - USES CANE  . Peyronie's disease   . History of nephrolithiasis 2009  . H/O hiatal hernia   . Right ureteral stone   .  Bladder stone   . CAD (coronary artery disease) CARDIOLOGIST- DR HOCHREIN--  LAST VISIT 09-27-2011 IN EPIC    Non obstructive Cath 2010-  HX CORONARY SPASM 2004  . Renal stone RIGHT  . RBBB   . Neuromuscular disorder     MS  . Movement disorder   . Vision abnormalities     PAST SURGICAL HISTORY: Past Surgical History  Procedure Laterality Date  . Extracorporeal shock wave lithotripsy  09-05-2010    RIGHT  . Nesbit procedure  10-17-2004    CORRECTION OF PENILE ANGULATION (PEYRONIES DISEASE)  . Cardiac catheterization  02-26-2009 ---  DR Ida Rogue    NONOBSTRUCTIVE CAD/ 50% DISTAL RCA/ 30% LAD / NORMAL LVF  . Cystoscopy with litholapaxy  10/30/2011    Procedure: CYSTOSCOPY WITH LITHOLAPAXY;  Surgeon: Claybon Jabs, MD;  Location: [facility];  Service: Urology;  Laterality: N/A;  . Cystoscopy w/ retrogrades  10/30/2011    Procedure: CYSTOSCOPY WITH RETROGRADE PYELOGRAM;  Surgeon: Claybon Jabs, MD;  Location[facility];  Service: Urology;  Laterality:  Right;    FAMILY HISTORY: Family History  Problem Relation Age of Onset  . Stroke Mother   . Heart attack Mother   . Renal cancer Father     SOCIAL HISTORY:  History   Social History  . Marital Status: Married    Spouse Name: N/A  . Number of Children: N/A  . Years of Education: N/A   Occupational History  . Not on file.   Social History Main Topics  . Smoking status: Former Smoker -- 1.00 packs/day for 7 years    Types: Cigarettes    Quit date: 09/26/1973  . Smokeless tobacco: Current User    Types: Chew     Comment: CHEW TOBACCO FOR 35 YRS  . Alcohol Use: No  . Drug Use: No  . Sexual Activity: Not on file   Other Topics Concern  . Not on file   Social History Narrative     PHYSICAL EXAM  Filed Vitals:   09/16/14 0956  BP: 122/68  Pulse: 64  Resp: 14  Height: 5\' 6"  (1.676 m)  Weight: 212 lb 6.4 oz (96.344 kg)    Body mass index is 34.3 kg/(m^2).   General: The patient is well-developed and well-nourished and in no acute distress  Eyes:  Funduscopic exam shows normal optic discs and retinal vessels.  Neck: The neck is supple, no carotid bruits are noted.  The neck is nontender.  Cardiovascular: The heart has a regular rate and rhythm with a normal S1 and S2. There were no murmurs, gallops or rubs. Lungs are clear to auscultation.  Skin: Extremities are without significant edema.  Musculoskeletal:  Back is nontender  Neurologic Exam  Mental status: The patient is alert and oriented x 3 at the time of the examination. The patient has apparent normal recent and remote memory, with an apparently normal attention span and concentration ability.   Speech is normal.  Cranial nerves: Extraocular movements are full. Pupils are equal, round, and reactive to light and accomodation.  Visual fields are full.  Facial symmetry is present. There is good facial sensation to soft touch bilaterally.Facial strength is normal.  Trapezius and sternocleidomastoid  strength is normal. No dysarthria is noted.  The tongue is midline, and the patient has symmetric elevation of the soft palate. No obvious hearing deficits are noted.  Motor:  Muscle bulk is normal.   Tone is increased in legs,  right greater than left. Strength is 2+ / 5 hip flexorsand 4-/5 distally in the right leg and 4/5 in the left leg. Strength is  5 / 5 in arm.    Sensory: Sensory testing is intact to pinprick, soft touch and vibration sensation in all 4 extremities.  Coordination: Cerebellar testing reveals good finger-nose-finger .  He cannot do right heel-to-shin and left is poor.  Gait and station: Station is normal.   Gait is spastic and has a right foot drop. The knee hyperextends a little bit on the right as he walks. He is unable to tandem gait. Romberg is negative..   Reflexes: Deep tendon reflexes are increased in his legs with spread at the right knee. He has 2 beats of nonsustained clonus on the right ankle..   Plantar responses are flexor.    DIAGNOSTIC DATA (LABS, IMAGING, TESTING) - I reviewed patient records, labs, notes, testing and imaging myself where available.  Lab Results  Component Value Date   WBC 5.3 05/27/2014   HGB 12.6* 05/27/2014   HCT 37.0* 05/27/2014   MCV 82.5 05/27/2014   PLT 161 04/06/2010      Component Value Date/Time   NA 143 05/27/2014 1023   NA 138 01/06/2013 1311   K 4.2 05/27/2014 1023   CL 102 05/27/2014 1023   CO2 25 05/27/2014 1023   GLUCOSE 116* 05/27/2014 1023   GLUCOSE 130* 01/06/2013 1311   BUN 17 05/27/2014 1023   BUN 21 01/06/2013 1311   CREATININE 0.96 05/27/2014 1023   CREATININE 1.13 01/06/2013 1311   CALCIUM 10.1 05/27/2014 1023   PROT 7.3 05/27/2014 1023   PROT 6.9 01/06/2013 1311   ALBUMIN 4.0 01/06/2013 1311   AST 26 05/27/2014 1023   ALT 21 05/27/2014 1023   ALKPHOS 55 05/27/2014 1023   BILITOT 0.3 05/27/2014 1023   GFRNONAA 83 05/27/2014 1023   GFRNONAA 69 01/06/2013 1311   GFRAA 96 05/27/2014 1023    GFRAA 80 01/06/2013 1311   Lab Results  Component Value Date   CHOL 113 05/27/2014   HDL 33* 05/27/2014   LDLCALC 77 01/28/2014   TRIG 71 05/27/2014   CHOLHDL 6.8* 05/14/2013   Lab Results  Component Value Date   HGBA1C 5.9 05/27/2014        ASSESSMENT AND PLAN  Multiple sclerosis - Plan: CMP, MR Brain W Wo Contrast, MR Cervical Spine W Wo Contrast, CBC with Differential/Platelet, CANCELED: CBC with Differential/Platelet  Spastic gait - Plan: CMP, MR Brain W Wo Contrast, MR Cervical Spine W Wo Contrast, CANCELED: CBC with Differential/Platelet  High risk medication use - Plan: CMP, MR Brain W Wo Contrast, MR Cervical Spine W Wo Contrast, CBC with Differential/Platelet, CANCELED: CBC with Differential/Platelet  Bladder disorder - Plan: CMP, MR Brain W Wo Contrast, MR Cervical Spine W Wo Contrast, CANCELED: CBC with Differential/Platelet   In summary, Jacob Fowler is a 64 year old man with multiple sclerosis been primarily manifested by a worsening spastic gait disorder and more recently urinary symptoms. We discussed that he should continue to wear the AFO brace. If the knee hyperextension worsens, I will consider having him see a brace maker again for a brace that will cover both the ankle and knee. I'll add oxybutynin to see her back and help his bladder. Hopefully this will also help his sleep which may help him feel less fatigue. He has had some skin reactions on Betaseron and I recommended that he switch to one of the pills but he would  prefer to stay on Betaseron at this time as he is good patient assistance. If he continues to have more skin reactions, I would want him to reconsider this decision. We'll also check an MRI of the brain and cervical spine to rule out subclinical progression as his gait has worsened despite no clear exacerbation. This will also help [place] to rule out other problems that might lead to worsening gait such as myelopathy or NPH.  He will return to see me  in 4 or 5 months or sooner if he has new or worsening neurologic symptoms.  45 minute face-to-face evaluation with greater than 50% of the time she will coordinating care about his MS and related symptoms.   Jacara Benito A. Felecia Shelling, MD, PhD 12/23/319, 22:48 AM Certified in Neurology, Clinical Neurophysiology, Sleep Medicine, Pain Medicine and Neuroimaging  [facility] [address] [place], [place] [address] [phone]

## 2014-09-17 LAB — COMPREHENSIVE METABOLIC PANEL
A/G RATIO: 1.7 (ref 1.1–2.5)
ALBUMIN: 4.4 g/dL (ref 3.6–4.8)
ALT: 19 IU/L (ref 0–44)
AST: 20 IU/L (ref 0–40)
Alkaline Phosphatase: 57 IU/L (ref 39–117)
BUN / CREAT RATIO: 20 (ref 10–22)
BUN: 19 mg/dL (ref 8–27)
Bilirubin Total: 0.3 mg/dL (ref 0.0–1.2)
CO2: 25 mmol/L (ref 18–29)
Calcium: 10.2 mg/dL (ref 8.6–10.2)
Chloride: 101 mmol/L (ref 97–108)
Creatinine, Ser: 0.96 mg/dL (ref 0.76–1.27)
GFR calc non Af Amer: 83 mL/min/{1.73_m2} (ref >59)
GFR, EST AFRICAN AMERICAN: 96 mL/min/{1.73_m2} (ref >59)
Globulin, Total: 2.6 g/dL (ref 1.5–4.5)
Glucose: 94 mg/dL (ref 65–99)
POTASSIUM: 4.5 mmol/L (ref 3.5–5.2)
Sodium: 140 mmol/L (ref 134–144)
Total Protein: 7 g/dL (ref 6.0–8.5)

## 2014-09-17 LAB — CBC WITH DIFFERENTIAL/PLATELET
Basophils Absolute: 0 10*3/uL (ref 0.0–0.2)
Basos: 0 %
Eos: 1 %
Eosinophils Absolute: 0.1 10*3/uL (ref 0.0–0.4)
HEMATOCRIT: 38.5 % (ref 37.5–51.0)
Hemoglobin: 13 g/dL (ref 12.6–17.7)
Immature Grans (Abs): 0 10*3/uL (ref 0.0–0.1)
Immature Granulocytes: 0 %
LYMPHS: 19 %
Lymphocytes Absolute: 0.9 10*3/uL (ref 0.7–3.1)
MCH: 28 pg (ref 26.6–33.0)
MCHC: 33.8 g/dL (ref 31.5–35.7)
MCV: 83 fL (ref 79–97)
MONOCYTES: 13 %
Monocytes Absolute: 0.6 10*3/uL (ref 0.1–0.9)
Neutrophils Absolute: 3.3 10*3/uL (ref 1.4–7.0)
Neutrophils Relative %: 67 %
Platelets: 184 10*3/uL (ref 150–379)
RBC: 4.65 x10E6/uL (ref 4.14–5.80)
RDW: 16.1 % — AB (ref 12.3–15.4)
WBC: 4.9 10*3/uL (ref 3.4–10.8)

## 2014-09-18 ENCOUNTER — Telehealth: Payer: Self-pay | Admitting: *Deleted

## 2014-09-18 NOTE — Telephone Encounter (Signed)
Release fax to [facility] on 09/16/14.

## 2014-09-22 ENCOUNTER — Encounter: Payer: Self-pay | Admitting: *Deleted

## 2014-09-28 ENCOUNTER — Other Ambulatory Visit: Payer: Self-pay | Admitting: Family Medicine

## 2014-09-28 ENCOUNTER — Ambulatory Visit
Admission: RE | Admit: 2014-09-28 | Discharge: 2014-09-28 | Disposition: A | Discharge status: Home / Self Care | Payer: PPO | Source: Ambulatory Visit | Attending: Neurology | Admitting: Neurology

## 2014-09-28 DIAGNOSIS — G35 Multiple sclerosis: Secondary | ICD-10-CM

## 2014-09-28 DIAGNOSIS — R261 Paralytic gait: Secondary | ICD-10-CM

## 2014-09-28 DIAGNOSIS — N329 Bladder disorder, unspecified: Secondary | ICD-10-CM

## 2014-09-28 DIAGNOSIS — Z79899 Other long term (current) drug therapy: Secondary | ICD-10-CM

## 2014-09-28 MED ORDER — GADOBENATE DIMEGLUMINE 529 MG/ML IV SOLN
14.0000 mL | Freq: Once | INTRAVENOUS | Status: AC | PRN
  Administered 2014-09-28: 14 mL via INTRAVENOUS

## 2014-10-01 ENCOUNTER — Telehealth: Payer: Self-pay | Admitting: *Deleted

## 2014-10-01 NOTE — Telephone Encounter (Signed)
Spoke with Jacob Fowler and per RAS, advised mri didn't show any new changes; continue meds as rx'd.  Jacob Fowler verbalized understanding of same/fim

## 2014-10-01 NOTE — Telephone Encounter (Signed)
-----   Message from Britt Bottom, MD sent at 09/30/2014  6:24 PM EDT ----- Please let him know MRI's showed no new changes.    Continue on medications

## 2014-10-08 ENCOUNTER — Encounter: Payer: Self-pay | Admitting: Family Medicine

## 2014-10-08 ENCOUNTER — Ambulatory Visit (INDEPENDENT_AMBULATORY_CARE_PROVIDER_SITE_OTHER): Payer: PPO | Admitting: Family Medicine

## 2014-10-08 VITALS — BP 128/75 | HR 69 | Temp 96.9°F | Ht 66.0 in | Wt 212.0 lb

## 2014-10-08 DIAGNOSIS — E559 Vitamin D deficiency, unspecified: Secondary | ICD-10-CM | POA: Diagnosis not present

## 2014-10-08 DIAGNOSIS — G35 Multiple sclerosis: Secondary | ICD-10-CM | POA: Diagnosis not present

## 2014-10-08 DIAGNOSIS — I10 Essential (primary) hypertension: Secondary | ICD-10-CM

## 2014-10-08 DIAGNOSIS — R195 Other fecal abnormalities: Secondary | ICD-10-CM

## 2014-10-08 DIAGNOSIS — I2510 Atherosclerotic heart disease of native coronary artery without angina pectoris: Secondary | ICD-10-CM | POA: Diagnosis not present

## 2014-10-08 DIAGNOSIS — R972 Elevated prostate specific antigen [PSA]: Secondary | ICD-10-CM | POA: Diagnosis not present

## 2014-10-08 DIAGNOSIS — E119 Type 2 diabetes mellitus without complications: Secondary | ICD-10-CM | POA: Diagnosis not present

## 2014-10-08 DIAGNOSIS — R531 Weakness: Secondary | ICD-10-CM | POA: Diagnosis not present

## 2014-10-08 DIAGNOSIS — R197 Diarrhea, unspecified: Secondary | ICD-10-CM

## 2014-10-08 LAB — POCT CBC
GRANULOCYTE PERCENT: 80.5 % — AB (ref 37–80)
HCT, POC: 38.6 % — AB (ref 43.5–53.7)
Hemoglobin: 12.4 g/dL — AB (ref 14.1–18.1)
LYMPH, POC: 0.7 (ref 0.6–3.4)
MCH, POC: 26.9 pg — AB (ref 27–31.2)
MCHC: 32.2 g/dL (ref 31.8–35.4)
MCV: 83.5 fL (ref 80–97)
MPV: 8.8 fL (ref 0–99.8)
POC Granulocyte: 4 (ref 2–6.9)
POC LYMPH %: 13.4 % (ref 10–50)
Platelet Count, POC: 153 10*3/uL (ref 142–424)
RBC: 4.62 M/uL — AB (ref 4.69–6.13)
RDW, POC: 15.4 %
WBC: 5 10*3/uL (ref 4.6–10.2)

## 2014-10-08 LAB — POCT GLYCOSYLATED HEMOGLOBIN (HGB A1C): Hemoglobin A1C: 6.2

## 2014-10-08 NOTE — Progress Notes (Signed)
Subjective:    [name], male    DOB: Apr 26, 1951, 64 y.o.   MRN: [id number]  HPI  Patient presents to office today for a four month chronic follow up. This patient has MS and is followed regularly by a neurologist in [facility]. He's had some increased difficulty with moving around especially after lunch each day. He is up-to-date on his prostate exam his FOBT and his chest x-ray. He will be getting lab work today and he is also up-to-date on his immunizations. His biggest complaints are some loose bowel movements and fatigue. He does drink a couple cups of coffee a day. All his medications were reviewed with him. He does take metformin. He does not check his blood sugars regularly at home even though he has a machine to do this with. He did see the urologist and he is due for a return check on his PSA in 1 year from that visit. He also sees the neurologist about every 6 months and just saw him this month. He had an MRI and the MRI was unchanged from previous MRIs. The patient denies chest pain shortness of breath heartburn nausea or vomiting. The stools are once or twice daily and are anywhere from soft to loose. He does take doxycycline for his rosacea. He has not had any of this recently. He is passing his water without problems. He is not going as much since he has started the oxybutynin. The patient denies chest pain and shortness of breath.  Patient Active Problem List   Diagnosis Date Noted  . 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 High risk medication use 09/16/2014  . Bladder disorder 09/16/2014  . Multiple sclerosis 01/06/2013  . Ureteral calculus, right 10/30/2011  . Bladder calculus 10/30/2011  . Tobacco use 09/27/2011  . CORONARY ATHEROSCLEROSIS, NATIVE VESSEL 10/06/2009  . Diabetes mellitus type 2 in nonobese 10/01/2009  . Hyperlipidemia associated with type 2 diabetes mellitus 10/01/2009  . HYPERTENSION 10/01/2009   Outpatient Encounter Prescriptions as of  10/08/2014  Medication Sig  . amLODipine (NORVASC) 2.5 MG tablet TAKE ONE TABLET BY MOUTH EVERY MORNING  . aspirin (LONGS ADULT LOW STRENGTH ASA) 81 MG EC tablet Take 81 mg by mouth daily.   . cholecalciferol (VITAMIN D) 1000 UNITS tablet Take 1,000 Units by mouth daily.  Marland Kitchen doxycycline (VIBRA-TABS) 100 MG tablet TAKE ONE TABLET BY MOUTH TWICE DAILY  . ezetimibe (ZETIA) 10 MG tablet Take 10 mg by mouth daily.  . fish oil-omega-3 fatty acids 1000 MG capsule Take 2 g by mouth 2 (two) times daily.  Marland Kitchen glucose blood test strip Use as instructed  . interferon beta-1b (BETASERON) 0.3 MG injection Inject 0.25 mg into the skin every other day.   . isosorbide mononitrate (IMDUR) 60 MG 24 hr tablet TAKE ONE-HALF (1/2) TABLET (30MG) BY MOUTH EVERY DAY  . lisinopril (PRINIVIL,ZESTRIL) 10 MG tablet Take 1 tablet (10 mg total) by mouth daily.  . metFORMIN (GLUCOPHAGE) 500 MG tablet TAKE TWO TABLETS (1000MG) BY MOUTH TWICEDAILY WITH MEALS  . omeprazole (PRILOSEC) 20 MG capsule Take 20 mg by mouth daily.  Marland Kitchen oxybutynin (DITROPAN) 5 MG tablet Take 1 tablet (5 mg total) by mouth 3 (three) times daily.  . pioglitazone (ACTOS) 30 MG tablet TAKE ONE TABLET BY MOUTH EVERY DAY  . rosuvastatin (CRESTOR) 10 MG tablet Take 1 tablet (10 mg total) by mouth daily.  . rosuvastatin (CRESTOR) 20 MG tablet Take 1 tablet (20 mg total) by mouth  daily. As directed  . Tamsulosin HCl (FLOMAX) 0.4 MG CAPS Take 0.4 mg by mouth daily after supper.   . [DISCONTINUED] lisinopril (PRINIVIL,ZESTRIL) 10 MG tablet TAKE ONE TABLET BY MOUTH EVERY DAY (Patient not taking: Reported on 09/16/2014)     Review of Systems  Constitutional: Positive for fatigue. Negative for fever, chills, diaphoresis, activity change, appetite change and unexpected weight change.  HENT: Negative.   Eyes: Negative.   Respiratory: Negative.   Cardiovascular: Negative.   Gastrointestinal: Positive for diarrhea. Negative for nausea, vomiting, abdominal pain,  constipation, blood in stool, abdominal distention, anal bleeding and rectal pain.  Endocrine: Negative.   Genitourinary: Negative.   Musculoskeletal: Negative.   Skin: Negative.   Allergic/Immunologic: Negative.   Neurological: Negative.   Hematological: Negative.   Psychiatric/Behavioral: Negative.        Objective:   Physical Exam  Constitutional: He is oriented to person, place, and time. He appears well-developed and well-nourished. No distress.  HENT:  Head: Normocephalic and atraumatic.  Right Ear: External ear normal.  Left Ear: External ear normal.  Mouth/Throat: No oropharyngeal exudate.  The throat is slightly red posteriorly and there is nasal congestion bilaterally.  Eyes: Conjunctivae and EOM are normal. Pupils are equal, round, and reactive to light. Right eye exhibits no discharge. Left eye exhibits no discharge. No scleral icterus.  Neck: Normal range of motion. Neck supple. No tracheal deviation present. No thyromegaly present.  No anterior cervical nodes or bruits  Cardiovascular: Normal rate, regular rhythm, normal heart sounds and intact distal pulses.  Exam reveals no gallop and no friction rub.   No murmur heard. The rhythm is regular at 72/m  Pulmonary/Chest: Effort normal and breath sounds normal. No respiratory distress. He has no wheezes. He has no rales. He exhibits no tenderness.  There is minimal congestion with coughing.  Abdominal: Soft. Bowel sounds are normal. He exhibits no mass. There is no tenderness. There is no rebound and no guarding.  The abdomen is nontender without masses or bruits  Genitourinary:  The patient is followed by Dr. Karsten Ro yearly for his rise in his PSA  Musculoskeletal: Normal range of motion. He exhibits no edema or tenderness.  Because of his MS he has weakness and uses a cane. He wears a posterior splint on his right foot and leg.  Lymphadenopathy:    He has no cervical adenopathy.  Neurological: He is alert and  oriented to person, place, and time. He has normal reflexes. No cranial nerve deficit.  Skin: Skin is warm and dry. No rash noted. No erythema. No pallor.  Psychiatric: He has a normal mood and affect. His behavior is normal. Judgment and thought content normal.  Nursing note and vitals reviewed.    BP 128/75 mmHg  Pulse 69  Temp(Src) 96.9 F (36.1 C) (Oral)  Ht 5' 6"  (1.676 m)  Wt 212 lb (96.163 kg)  BMI 34.23 kg/m2     Assessment & Plan:  1. Multiple sclerosis -The patient sees the neurologist regularly about every 5-6 months. - POCT CBC  2. Essential hypertension -The blood pressure is under good control he should continue with his current treatment. - POCT CBC - BMP8+EGFR - Hepatic function panel  3. Diabetes mellitus type 2 in nonobese -The patient needs to check his blood sugars more regularly and should bring some readings in for review in a couple weeks to meet with the clinical pharmacists at that time. If he is still having loose bowel movements we will have  to make more permanent adjustments with his metformin. - POCT CBC - POCT glycosylated hemoglobin (Hb A1C)  4. Atherosclerosis of native coronary artery of native heart without angina pectoris -The patient denies any chest pain or tightness with exertion - POCT CBC - NMR, lipoprofile  5. Weakness generalized -This is been going on for a couple weeks and since he's been having more loose bowel movements. - POCT CBC - BMP8+EGFR - Hepatic function panel  6. Loose bowel movements -We will reduce the metformin for short period of time and he will leave off the caffeine to see if this has any impact on the loose bowel movements. - POCT CBC  7. Elevated PSA -He will continue to follow up yearly with the urologist - POCT CBC  8. Vitamin D deficiency -We will make any changes with his vitamin D dosing when the lab work is returned - Vit D  25 hydroxy (rtn osteoporosis monitoring)  Patient Instructions  The  patient should refrain from using as much caffeine as possible For 1 week he should reduce the metformin to 1 twice a day instead of 2 twice a day. After one week he should increase this to 2 in the morning and one in the evening for another week and then after another week of back to 2 twice a day. Drink plenty of fluids If the loose stools persist he may need to have stool cultures done.  The patient should be encouraged to check his blood sugars more regularly. He should bring these readings in for review in a couple weeks. We will ask the patient have a follow-up appointment with the clinical pharmacists and a couple weeks to bring in his blood sugars for review at that time. Continue all meds--- except reduce the metformin as directed and restart as directed Continue Lifestyle modification- diet and exercise Fall precautions discussed Keep follow up appointments with any specialists     Arrie Senate MD

## 2014-10-08 NOTE — Patient Instructions (Addendum)
The patient should refrain from using as much caffeine as possible For 1 week he should reduce the metformin to 1 twice a day instead of 2 twice a day. After one week he should increase this to 2 in the morning and one in the evening for another week and then after another week of back to 2 twice a day. Drink plenty of fluids If the loose stools persist he may need to have stool cultures done.  The patient should be encouraged to check his blood sugars more regularly. He should bring these readings in for review in a couple weeks. We will ask the patient have a follow-up appointment with the clinical pharmacists and a couple weeks to bring in his blood sugars for review at that time. Continue all meds--- except reduce the metformin as directed and restart as directed Continue Lifestyle modification- diet and exercise Fall precautions discussed Keep follow up appointments with any specialists

## 2014-10-09 LAB — BMP8+EGFR
BUN / CREAT RATIO: 21 (ref 10–22)
BUN: 20 mg/dL (ref 8–27)
CO2: 21 mmol/L (ref 18–29)
Calcium: 10 mg/dL (ref 8.6–10.2)
Chloride: 101 mmol/L (ref 97–108)
Creatinine, Ser: 0.96 mg/dL (ref 0.76–1.27)
GFR calc Af Amer: 96 mL/min/{1.73_m2} (ref >59)
GFR calc non Af Amer: 83 mL/min/{1.73_m2} (ref >59)
Glucose: 106 mg/dL — ABNORMAL HIGH (ref 65–99)
Potassium: 4 mmol/L (ref 3.5–5.2)
SODIUM: 140 mmol/L (ref 134–144)

## 2014-10-09 LAB — HEPATIC FUNCTION PANEL
ALK PHOS: 53 IU/L (ref 39–117)
ALT: 20 IU/L (ref 0–44)
AST: 19 IU/L (ref 0–40)
Albumin: 4.7 g/dL (ref 3.6–4.8)
BILIRUBIN, DIRECT: 0.12 mg/dL (ref 0.00–0.40)
Bilirubin Total: 0.4 mg/dL (ref 0.0–1.2)
Total Protein: 7.3 g/dL (ref 6.0–8.5)

## 2014-10-09 LAB — NMR, LIPOPROFILE
Cholesterol: 103 mg/dL (ref 100–199)
HDL Cholesterol by NMR: 33 mg/dL — ABNORMAL LOW (ref >39)
HDL Particle Number: 28.8 umol/L — ABNORMAL LOW (ref >=30.5)
LDL Particle Number: 857 nmol/L (ref <1000)
LDL SIZE: 20.4 nm (ref >20.5)
LDL-C: 55 mg/dL (ref 0–99)
LP-IR SCORE: 50 — AB (ref <=45)
Small LDL Particle Number: 584 nmol/L — ABNORMAL HIGH (ref <=527)
Triglycerides by NMR: 76 mg/dL (ref 0–149)

## 2014-10-09 LAB — VITAMIN D 25 HYDROXY (VIT D DEFICIENCY, FRACTURES): Vit D, 25-Hydroxy: 53.3 ng/mL (ref 30.0–100.0)

## 2014-10-22 ENCOUNTER — Ambulatory Visit: Payer: PPO

## 2014-10-30 ENCOUNTER — Encounter: Payer: Self-pay | Admitting: *Deleted

## 2014-11-30 ENCOUNTER — Other Ambulatory Visit: Payer: Self-pay | Admitting: Family Medicine

## 2014-12-01 ENCOUNTER — Other Ambulatory Visit: Payer: Self-pay | Admitting: Family Medicine

## 2014-12-18 ENCOUNTER — Ambulatory Visit (INDEPENDENT_AMBULATORY_CARE_PROVIDER_SITE_OTHER): Payer: PPO | Admitting: Family

## 2014-12-18 ENCOUNTER — Encounter: Payer: Self-pay | Admitting: Family

## 2014-12-18 VITALS — BP 126/68 | HR 75 | Temp 97.0°F | Ht 66.0 in | Wt 147.2 lb

## 2014-12-18 DIAGNOSIS — M545 Low back pain, unspecified: Secondary | ICD-10-CM

## 2014-12-18 DIAGNOSIS — R531 Weakness: Secondary | ICD-10-CM

## 2014-12-18 LAB — POCT UA - MICROSCOPIC ONLY
BACTERIA, U MICROSCOPIC: NEGATIVE
Casts, Ur, LPF, POC: NEGATIVE
Crystals, Ur, HPF, POC: NEGATIVE
MUCUS UA: NEGATIVE
RBC, URINE, MICROSCOPIC: NEGATIVE
WBC, Ur, HPF, POC: NEGATIVE
YEAST UA: NEGATIVE

## 2014-12-18 LAB — POCT URINALYSIS DIPSTICK
BILIRUBIN UA: NEGATIVE
Blood, UA: NEGATIVE
Glucose, UA: NEGATIVE
KETONES UA: NEGATIVE
Leukocytes, UA: NEGATIVE
NITRITE UA: NEGATIVE
PH UA: 5
Protein, UA: NEGATIVE
SPEC GRAV UA: 1.01
UROBILINOGEN UA: NEGATIVE

## 2014-12-18 NOTE — Patient Instructions (Signed)

## 2014-12-18 NOTE — Progress Notes (Signed)
   Subjective:    [name], male    DOB: 1950/11/29, 64 y.o.   MRN: [id number]  HPI  Pt presents to the office today for generalized weakness and elevated BP. Pt states at home his BP has been running 160's/ 90's. However, I believe these were false reading. Pt brought in home blood pressure machine and measured BP against Vital Machine here. BP in office WNL.   PT states he has weakness every midmorning around 9-10 AM. Pt states later on in the day he starts feeling better. Pt also states he is having Right sided back pain with a small amount of nausea. Pt denies any headache, palpitations, SOB, or edema at this time.     Review of Systems  Constitutional: Negative.   HENT: Negative.   Respiratory: Negative.   Cardiovascular: Negative.   Gastrointestinal: Negative.   Endocrine: Negative.   Genitourinary: Negative.   Musculoskeletal: Negative.   Neurological: Negative.   Hematological: Negative.   Psychiatric/Behavioral: Negative.   All other systems reviewed and are negative.      Objective:   Physical Exam  Constitutional: He is oriented to person, place, and time. He appears well-developed and well-nourished. No distress.  HENT:  Head: Normocephalic.  Right Ear: External ear normal.  Left Ear: External ear normal.  Nose: Nose normal.  Mouth/Throat: Oropharynx is clear and moist.  Eyes: Pupils are equal, round, and reactive to light. Right eye exhibits no discharge. Left eye exhibits no discharge.  Neck: Normal range of motion. Neck supple. No thyromegaly present.  Cardiovascular: Normal rate, regular rhythm, normal heart sounds and intact distal pulses.   No murmur heard. Pulmonary/Chest: Effort normal and breath sounds normal. No respiratory distress. He has no wheezes.  Abdominal: Soft. Bowel sounds are normal. He exhibits no distension. There is no tenderness.  Musculoskeletal: Normal range of motion. He exhibits no edema or tenderness.    Neurological: He is alert and oriented to person, place, and time. He has normal reflexes. No cranial nerve deficit.  Skin: Skin is warm and dry. No rash noted. No erythema.  Psychiatric: He has a normal mood and affect. His behavior is normal. Judgment and thought content normal.  Vitals reviewed.     BP 126/68 mmHg  Pulse 75  Temp(Src) 97 F (36.1 C) (Oral)  Ht $R'5\' 6"'zl$  (1.676 m)  Wt 147 lb 3.2 oz (66.769 kg)  BMI 23.77 kg/m2     Assessment & Plan:  1. Generalized weakness -Rest -Force fluids - POCT UA - Microscopic Only - POCT urinalysis dipstick - Anemia Profile B - Thyroid Panel With TSH - CMP14+EGFR  2. Right-sided low back pain without sciatica - POCT UA - Microscopic Only - POCT urinalysis dipstick  Falls precaution discussed Continue all meds Labs pending Diet and exercise encouraged RTO as needed and keep all chronic follow up with Dr. Ethlyn Daniels, FNP

## 2014-12-19 LAB — ANEMIA PROFILE B
Basophils Absolute: 0 10*3/uL (ref 0.0–0.2)
Basos: 0 %
EOS (ABSOLUTE): 0 10*3/uL (ref 0.0–0.4)
Eos: 1 %
FERRITIN: 109 ng/mL (ref 30–400)
FOLATE: 9.7 ng/mL (ref >3.0)
HEMATOCRIT: 34.8 % — AB (ref 37.5–51.0)
HEMOGLOBIN: 11.7 g/dL — AB (ref 12.6–17.7)
IMMATURE GRANS (ABS): 0 10*3/uL (ref 0.0–0.1)
IMMATURE GRANULOCYTES: 0 %
Iron Saturation: 15 % (ref 15–55)
Iron: 46 ug/dL (ref 38–169)
LYMPHS ABS: 0.5 10*3/uL — AB (ref 0.7–3.1)
Lymphs: 14 %
MCH: 28.5 pg (ref 26.6–33.0)
MCHC: 33.6 g/dL (ref 31.5–35.7)
MCV: 85 fL (ref 79–97)
MONOS ABS: 0.6 10*3/uL (ref 0.1–0.9)
Monocytes: 15 %
NEUTROS ABS: 2.6 10*3/uL (ref 1.4–7.0)
Neutrophils: 70 %
Platelets: 189 10*3/uL (ref 150–379)
RBC: 4.11 x10E6/uL — ABNORMAL LOW (ref 4.14–5.80)
RDW: 15.3 % (ref 12.3–15.4)
Retic Ct Pct: 0.8 % (ref 0.6–2.6)
TIBC: 315 ug/dL (ref 250–450)
UIBC: 269 ug/dL (ref 111–343)
VITAMIN B 12: 153 pg/mL — AB (ref 211–946)
WBC: 3.7 10*3/uL (ref 3.4–10.8)

## 2014-12-19 LAB — CMP14+EGFR
A/G RATIO: 1.8 (ref 1.1–2.5)
ALBUMIN: 4.2 g/dL (ref 3.6–4.8)
ALT: 15 IU/L (ref 0–44)
AST: 18 IU/L (ref 0–40)
Alkaline Phosphatase: 60 IU/L (ref 39–117)
BUN/Creatinine Ratio: 17 (ref 10–22)
BUN: 16 mg/dL (ref 8–27)
Bilirubin Total: 0.4 mg/dL (ref 0.0–1.2)
CALCIUM: 9.2 mg/dL (ref 8.6–10.2)
CHLORIDE: 100 mmol/L (ref 97–108)
CO2: 26 mmol/L (ref 18–29)
CREATININE: 0.95 mg/dL (ref 0.76–1.27)
GFR calc Af Amer: 97 mL/min/{1.73_m2} (ref >59)
GFR, EST NON AFRICAN AMERICAN: 84 mL/min/{1.73_m2} (ref >59)
GLUCOSE: 160 mg/dL — AB (ref 65–99)
Globulin, Total: 2.4 g/dL (ref 1.5–4.5)
POTASSIUM: 3.6 mmol/L (ref 3.5–5.2)
Sodium: 143 mmol/L (ref 134–144)
TOTAL PROTEIN: 6.6 g/dL (ref 6.0–8.5)

## 2014-12-19 LAB — THYROID PANEL WITH TSH
Free Thyroxine Index: 2.5 (ref 1.2–4.9)
T3 Uptake Ratio: 26 % (ref 24–39)
T4, Total: 9.5 ug/dL (ref 4.5–12.0)
TSH: 4.72 u[IU]/mL — ABNORMAL HIGH (ref 0.450–4.500)

## 2014-12-21 ENCOUNTER — Other Ambulatory Visit: Payer: Self-pay | Admitting: Family

## 2014-12-21 MED ORDER — LEVOTHYROXINE SODIUM 25 MCG PO TABS: 25.0000 ug | ORAL_TABLET | Freq: Every day | ORAL | Status: DC

## 2014-12-22 ENCOUNTER — Telehealth: Payer: Self-pay | Admitting: Family Medicine

## 2014-12-22 NOTE — Telephone Encounter (Signed)
Appointment given for Monday to start B12 injections

## 2014-12-28 ENCOUNTER — Other Ambulatory Visit: Payer: Self-pay | Admitting: Family Medicine

## 2014-12-28 ENCOUNTER — Ambulatory Visit (INDEPENDENT_AMBULATORY_CARE_PROVIDER_SITE_OTHER): Payer: PPO | Admitting: *Deleted

## 2014-12-28 DIAGNOSIS — D519 Vitamin B12 deficiency anemia, unspecified: Secondary | ICD-10-CM

## 2014-12-28 MED ORDER — CYANOCOBALAMIN 1000 MCG/ML IJ SOLN
1000.0000 ug | Freq: Once | INTRAMUSCULAR | Status: AC
  Administered 2014-12-28: 1000 ug via INTRAMUSCULAR

## 2014-12-29 ENCOUNTER — Ambulatory Visit (INDEPENDENT_AMBULATORY_CARE_PROVIDER_SITE_OTHER): Payer: PPO | Admitting: *Deleted

## 2014-12-29 DIAGNOSIS — D519 Vitamin B12 deficiency anemia, unspecified: Secondary | ICD-10-CM | POA: Diagnosis not present

## 2014-12-29 MED ORDER — CYANOCOBALAMIN 1000 MCG/ML IJ SOLN
1000.0000 ug | Freq: Every day | INTRAMUSCULAR | Status: AC
  Administered 2014-12-29 – 2015-01-01 (×4): 1000 ug via INTRAMUSCULAR

## 2014-12-29 NOTE — Patient Instructions (Signed)

## 2014-12-29 NOTE — Progress Notes (Signed)
Vitamin b12 injection given and tolerated well.  

## 2014-12-30 ENCOUNTER — Ambulatory Visit (INDEPENDENT_AMBULATORY_CARE_PROVIDER_SITE_OTHER): Payer: PPO | Admitting: *Deleted

## 2014-12-30 DIAGNOSIS — D519 Vitamin B12 deficiency anemia, unspecified: Secondary | ICD-10-CM

## 2014-12-30 NOTE — Progress Notes (Signed)
Vitamin b12 injection given and tolerated well.  

## 2014-12-30 NOTE — Patient Instructions (Signed)

## 2014-12-31 ENCOUNTER — Ambulatory Visit (INDEPENDENT_AMBULATORY_CARE_PROVIDER_SITE_OTHER): Payer: PPO

## 2014-12-31 DIAGNOSIS — D519 Vitamin B12 deficiency anemia, unspecified: Secondary | ICD-10-CM

## 2015-01-01 ENCOUNTER — Ambulatory Visit (INDEPENDENT_AMBULATORY_CARE_PROVIDER_SITE_OTHER): Payer: PPO

## 2015-01-01 ENCOUNTER — Other Ambulatory Visit: Payer: Self-pay | Admitting: Family Medicine

## 2015-01-01 DIAGNOSIS — D519 Vitamin B12 deficiency anemia, unspecified: Secondary | ICD-10-CM

## 2015-01-05 ENCOUNTER — Ambulatory Visit (INDEPENDENT_AMBULATORY_CARE_PROVIDER_SITE_OTHER): Payer: PPO | Admitting: *Deleted

## 2015-01-05 DIAGNOSIS — E538 Deficiency of other specified B group vitamins: Secondary | ICD-10-CM | POA: Diagnosis not present

## 2015-01-05 MED ORDER — CYANOCOBALAMIN 1000 MCG/ML IJ SOLN
1000.0000 ug | INTRAMUSCULAR | Status: DC
  Administered 2015-01-12 – 2015-03-02 (×4): 1000 ug via INTRAMUSCULAR

## 2015-01-05 NOTE — Progress Notes (Signed)
[name], male   DOB: 01-21-1951, 64 y.o.   MRN: [id number] Pt tolerated inj well

## 2015-01-12 ENCOUNTER — Ambulatory Visit (INDEPENDENT_AMBULATORY_CARE_PROVIDER_SITE_OTHER): Payer: PPO | Admitting: *Deleted

## 2015-01-12 DIAGNOSIS — E538 Deficiency of other specified B group vitamins: Secondary | ICD-10-CM

## 2015-01-12 NOTE — Progress Notes (Signed)
Patient tolerated well.

## 2015-01-20 ENCOUNTER — Ambulatory Visit (INDEPENDENT_AMBULATORY_CARE_PROVIDER_SITE_OTHER): Payer: PPO

## 2015-01-20 DIAGNOSIS — E538 Deficiency of other specified B group vitamins: Secondary | ICD-10-CM

## 2015-01-27 ENCOUNTER — Ambulatory Visit (INDEPENDENT_AMBULATORY_CARE_PROVIDER_SITE_OTHER): Payer: PPO | Admitting: *Deleted

## 2015-01-27 DIAGNOSIS — E538 Deficiency of other specified B group vitamins: Secondary | ICD-10-CM

## 2015-01-27 NOTE — Patient Instructions (Signed)

## 2015-01-27 NOTE — Progress Notes (Signed)
Vitamin b12 injection given and tolerated well.  

## 2015-02-15 ENCOUNTER — Ambulatory Visit: Payer: PPO | Admitting: Family Medicine

## 2015-02-17 ENCOUNTER — Encounter: Payer: Self-pay | Admitting: Neurology

## 2015-02-17 ENCOUNTER — Ambulatory Visit (INDEPENDENT_AMBULATORY_CARE_PROVIDER_SITE_OTHER): Payer: PPO | Admitting: Neurology

## 2015-02-17 VITALS — BP 118/64 | HR 67 | Temp 97.6°F | Ht 66.0 in | Wt 156.2 lb

## 2015-02-17 DIAGNOSIS — R261 Paralytic gait: Secondary | ICD-10-CM | POA: Diagnosis not present

## 2015-02-17 DIAGNOSIS — R531 Weakness: Secondary | ICD-10-CM | POA: Insufficient documentation

## 2015-02-17 DIAGNOSIS — R5383 Other fatigue: Secondary | ICD-10-CM | POA: Diagnosis not present

## 2015-02-17 DIAGNOSIS — M21371 Foot drop, right foot: Secondary | ICD-10-CM | POA: Diagnosis not present

## 2015-02-17 DIAGNOSIS — N399 Disorder of urinary system, unspecified: Secondary | ICD-10-CM | POA: Diagnosis not present

## 2015-02-17 DIAGNOSIS — G4700 Insomnia, unspecified: Secondary | ICD-10-CM | POA: Insufficient documentation

## 2015-02-17 DIAGNOSIS — G35 Multiple sclerosis: Secondary | ICD-10-CM

## 2015-02-17 DIAGNOSIS — F5109 Other insomnia not due to a substance or known physiological condition: Secondary | ICD-10-CM | POA: Insufficient documentation

## 2015-02-17 NOTE — Progress Notes (Signed)
[facility]  PATIENT: Jacob Fowler DOB: 1951-05-28  REFERRING DOCTOR OR PCP:  Redge Gainer SOURCE: patient  _________________________________   HISTORICAL  CHIEF COMPLAINT:  Chief Complaint  Patient presents with  . Follow-up    Here with wife, Hassan Rowan in room 5. Betaseron injections going well. Sites healing okay. No complaints at this time.    HISTORY OF PRESENT ILLNESS:  Jacob Fowler is a 64 year old man with multiple sclerosis.     Gait/strength/sensation: His worse issue os spastic gait. His right leg is weaker and more spastic than the left leg. He has a right AFO which helps his foot drop some.   He stumbles but no fall in last couple months.  He denies any numbness in the legs. He works outdoors and he notes balance is reduced.   He has been using an AFO brace on the right leg since 2011.  He denies any significant numbness, weakness or clumsiness in the arms.   He denies painful  spasms.     He tried Ampyra in the past but it has not helped.     Bladder: He has urinary frequency,  urgency and urinary hesitancy. Tamsulosin with ditropan has helped the urinary hesitancy.   He still has to go every 1-3 hours while awake but usually has 0-1 nocturia.   Vision: He denies any MS related visual problems. He does wear glasses for correction.  Fatigue/sleep: He gets fatigue every day that is worse after noon.   He sually starts every day feeling fairly energize. Fatigue is entirely physical.  He denies cognitive fatigue. He feels that the fatigue has worsened over the last year. He feels he is slepping better than last year.   He does not snore much and his wife has not noted any apnea.   Mood:   He denies any depression or anxiety. His wife had noted he was sometimes very quiet but he is never tearful and is not irritable.  Cognitive: He feels his short-term memory is a little worse in the used to but he notes no other cognitive issue.   MS History:   He  began to note significant difficulty with gait in January 2010.      He had some back x-rays. He was then referred to neurosurgery that noted that he had plaques in his spine c/w  MS. He had further testing including MRI of the brain and spinal tap. These were all consistent with multiple sclerosis and by November 2010 he had started Betaseron. He feels that he has been mostly stable over the past 5 years but has noted that his gait is a little worse than it was in 2010. About 2 or 3 years ago, he started using a cane.  NEW DATA SINCE LAST VISIT: MRI Images 09/30/2014 were personally reviewed and compared.  I concur with the interpretations: Abnormal MRI cervical spine (with and without) demonstrating: 1. Chronic demyelinating spinal cord plaques at C4-5 on the left and T1 on the right. 2. No acute plaques. 3. No significant change from MRI on 09/22/10.  Abnormal MRI brain (with and without) demonstrating: 1. Multiple supratentorial chronic demyelinating plaques. Some of these are hypointense on T1 views. 2. No acute plaques. 3. No change from MRI on 09/22/10.    REVIEW OF SYSTEMS: Constitutional: No fevers, chills, sweats, or change in appetite.  He notes fatigue  Eyes: No visual changes, double vision, eye pain Ear, nose and throat: No hearing loss, ear pain, nasal  congestion, sore throat Cardiovascular: No chest pain, palpitations Respiratory: No shortness of breath at rest or with exertion.   No wheezes GastrointestinaI: No nausea, vomiting, diarrhea, abdominal pain, fecal incontinence Genitourinary: urinary frequency and hesitancy - both better with med's.  No nocturia now Musculoskeletal: No neck pain, back pain Integumentary: No rash, pruritus, skin lesions Neurological: as above Psychiatric: No depression at this time.  No anxiety Endocrine: No palpitations, diaphoresis, change in appetite, change in weigh or increased thirst Hematologic/Lymphatic: No anemia, purpura,  petechiae. Allergic/Immunologic: No itchy/runny eyes, nasal congestion, recent allergic reactions, rashes  ALLERGIES: No Known Allergies  HOME MEDICATIONS:  Current outpatient prescriptions:  .  amLODipine (NORVASC) 2.5 MG tablet, TAKE ONE TABLET BY MOUTH EVERY MORNING, Disp: 90 tablet, Rfl: 0 .  aspirin (LONGS ADULT LOW STRENGTH ASA) 81 MG EC tablet, Take 81 mg by mouth daily. , Disp: , Rfl:  .  cholecalciferol (VITAMIN D) 1000 UNITS tablet, Take 1,000 Units by mouth daily., Disp: , Rfl:  .  doxycycline (VIBRA-TABS) 100 MG tablet, TAKE ONE TABLET BY MOUTH TWICE DAILY, Disp: 30 tablet, Rfl: 2 .  fish oil-omega-3 fatty acids 1000 MG capsule, Take 2 g by mouth 2 (two) times daily., Disp: , Rfl:  .  glucose blood test strip, Use as instructed, Disp: 100 each, Rfl: 12 .  interferon beta-1b (BETASERON) 0.3 MG injection, Inject 0.25 mg into the skin every other day. , Disp: , Rfl:  .  isosorbide mononitrate (IMDUR) 60 MG 24 hr tablet, TAKE ONE-HALF (1/2) TABLET (30MG ) BY MOUTH EVERY DAY, Disp: 15 tablet, Rfl: 5 .  levothyroxine (LEVOTHROID) 25 MCG tablet, Take 1 tablet (25 mcg total) by mouth daily before breakfast., Disp: 90 tablet, Rfl: 1 .  lisinopril (PRINIVIL,ZESTRIL) 10 MG tablet, TAKE ONE TABLET BY MOUTH EVERY DAY, Disp: 30 tablet, Rfl: 3 .  metFORMIN (GLUCOPHAGE) 500 MG tablet, TAKE TWO TABLETS (1000MG ) BY MOUTH TWICEDAILY WITH MEALS, Disp: 120 tablet, Rfl: 2 .  omeprazole (PRILOSEC) 20 MG capsule, Take 20 mg by mouth daily., Disp: , Rfl:  .  oxybutynin (DITROPAN) 5 MG tablet, Take 1 tablet (5 mg total) by mouth 3 (three) times daily., Disp: 60 tablet, Rfl: 11 .  pioglitazone (ACTOS) 30 MG tablet, TAKE ONE TABLET BY MOUTH EVERY DAY, Disp: 30 tablet, Rfl: 3 .  rosuvastatin (CRESTOR) 20 MG tablet, Take 1 tablet (20 mg total) by mouth daily. As directed, Disp: 90 tablet, Rfl: 1 .  Tamsulosin HCl (FLOMAX) 0.4 MG CAPS, Take 0.4 mg by mouth daily after supper. , Disp: , Rfl:  .  ezetimibe  (ZETIA) 10 MG tablet, Take 10 mg by mouth daily., Disp: , Rfl:  .  [DISCONTINUED] atorvastatin (LIPITOR) 10 MG tablet, Take 10 mg by mouth daily.  , Disp: , Rfl:  .  [DISCONTINUED] niacin-simvastatin (SIMCOR) 500-20 MG 24 hr tablet, Take 1 tablet by mouth at bedtime.  , Disp: , Rfl:   Current facility-administered medications:  .  cyanocobalamin ((VITAMIN B-12)) injection 1,000 mcg, 1,000 mcg, Intramuscular, Q30 days, Chipper Herb, MD, 1,000 mcg at 01/27/15 6387  PAST MEDICAL HISTORY: Past Medical History  Diagnosis Date  . NIDDM (non-insulin dependent diabetes mellitus)   . BPH associated with nocturia   . Hyperlipidemia   . Hypertension   . Acquired leg length discrepancy   . Hx of adenomatous colonic polyps   . Rosacea   . GERD (gastroesophageal reflux disease)   . Multiple sclerosis DX  10/10---  NEUROLOGIST  DR Delfino Lovett [facility] ([facility])    RIGHT SIDE AFFECTED MORE W/ WEAKNESS- - USES CANE  . Peyronie's disease   . History of nephrolithiasis 2009  . H/O hiatal hernia   . Right ureteral stone   . Bladder stone   . CAD (coronary artery disease) CARDIOLOGIST- DR HOCHREIN--  LAST VISIT 09-27-2011 IN EPIC    Non obstructive Cath 2010-  HX CORONARY SPASM 2004  . Renal stone RIGHT  . RBBB   . Neuromuscular disorder     MS  . Movement disorder   . Vision abnormalities     PAST SURGICAL HISTORY: Past Surgical History  Procedure Laterality Date  . Extracorporeal shock wave lithotripsy  09-05-2010    RIGHT  . Nesbit procedure  10-17-2004    CORRECTION OF PENILE ANGULATION (PEYRONIES DISEASE)  . Cardiac catheterization  02-26-2009 ---  DR Ida Rogue    NONOBSTRUCTIVE CAD/ 50% DISTAL RCA/ 30% LAD / NORMAL LVF  . Cystoscopy with litholapaxy  10/30/2011    Procedure: CYSTOSCOPY WITH LITHOLAPAXY;  Surgeon: Claybon Jabs, MD;  Location: [facility];  Service: Urology;  Laterality: N/A;  . Cystoscopy w/ retrogrades  10/30/2011    Procedure: CYSTOSCOPY WITH  RETROGRADE PYELOGRAM;  Surgeon: Claybon Jabs, MD;  Location: [facility];  Service: Urology;  Laterality: Right;    FAMILY HISTORY: Family History  Problem Relation Age of Onset  . Stroke Mother   . Heart attack Mother   . Renal cancer Father     SOCIAL HISTORY:  History   Social History  . Marital Status: Married    Spouse Name: N/A  . Number of Children: N/A  . Years of Education: N/A   Occupational History  . Not on file.   Social History Main Topics  . Smoking status: Former Smoker -- 1.00 packs/day for 7 years    Types: Cigarettes    Quit date: 09/26/1973  . Smokeless tobacco: Current User    Types: Chew     Comment: CHEW TOBACCO FOR 35 YRS  . Alcohol Use: No  . Drug Use: No  . Sexual Activity: Not on file   Other Topics Concern  . Not on file   Social History Narrative     PHYSICAL EXAM  Filed Vitals:   02/17/15 0847  BP: 118/64  Pulse: 67  Temp: 97.6 F (36.4 C)  TempSrc: Oral  Height: 5\' 6"  (1.676 m)  Weight: 156 lb 3.2 oz (70.852 kg)    Body mass index is 25.22 kg/(m^2).   General: The patient is well-developed and well-nourished and in no acute distress  Eyes:  Funduscopic exam shows normal optic discs and retinal vessels.  Neck: The neck is supple, no carotid bruits are noted.  The neck is nontender.  Cardiovascular: The heart has a regular rate and rhythm with a normal S1 and S2. There were no murmurs, gallops or rubs. Lungs are clear to auscultation.  Skin: Extremities are without significant edema.  Musculoskeletal:  Back is nontender  Neurologic Exam  Mental status: The patient is alert and oriented x 3 at the time of the examination. The patient has apparent normal recent and remote memory, with an apparently normal attention span and concentration ability.   Speech is normal.  Cranial nerves: Extraocular movements are full. Pupils are equal, round, and reactive to light and accomodation.  Visual fields are  full.  Facial symmetry is present. There is good facial sensation to soft touch bilaterally.Facial strength is normal.  Trapezius  and sternocleidomastoid strength is normal. No dysarthria is noted.  The tongue is midline, and the patient has symmetric elevation of the soft palate. No obvious hearing deficits are noted.  Motor:  Muscle bulk is normal.   Tone is increased in legs, right greater than left. Strength is 2 / 5 hip flexors and 4-/5 distally in the right leg and 4 to 4+/5 in the left leg. Strength is  5 / 5 in arm.    Sensory: Sensory testing is intact to pinprick, soft touch and vibration sensation in all 4 extremities.  Coordination: Cerebellar testing reveals good finger-nose-finger .  He cannot do right heel-to-shin and left is poor.  Gait and station: Station is normal.   Gait is spastic and has a right foot drop. The knee hyperextends a little bit on the right as he walks. He is unable to tandem gait. Romberg is negative..   Reflexes: Deep tendon reflexes are increased in his legs with spread at the right knee. He has 2 beats of nonsustained clonus on the right ankle.Marland Kitchen      DIAGNOSTIC DATA (LABS, IMAGING, TESTING) - I reviewed patient records, labs, notes, testing and imaging myself where available.  Lab Results  Component Value Date   WBC 3.7 12/18/2014   HGB 12.4* 10/08/2014   HCT 34.8* 12/18/2014   MCV 83.5 10/08/2014   PLT 184 09/16/2014      Component Value Date/Time   NA 143 12/18/2014 1540   NA 138 01/06/2013 1311   K 3.6 12/18/2014 1540   CL 100 12/18/2014 1540   CO2 26 12/18/2014 1540   GLUCOSE 160* 12/18/2014 1540   GLUCOSE 130* 01/06/2013 1311   BUN 16 12/18/2014 1540   BUN 21 01/06/2013 1311   CREATININE 0.95 12/18/2014 1540   CREATININE 1.13 01/06/2013 1311   CALCIUM 9.2 12/18/2014 1540   PROT 6.6 12/18/2014 1540   PROT 6.9 01/06/2013 1311   ALBUMIN 4.0 01/06/2013 1311   AST 18 12/18/2014 1540   ALT 15 12/18/2014 1540   ALKPHOS 60 12/18/2014  1540   BILITOT 0.4 12/18/2014 1540   BILITOT 0.3 05/27/2014 1023   GFRNONAA 84 12/18/2014 1540   GFRNONAA 69 01/06/2013 1311   GFRAA 97 12/18/2014 1540   GFRAA 80 01/06/2013 1311   Lab Results  Component Value Date   CHOL 103 10/08/2014   HDL 33* 10/08/2014   LDLCALC 77 01/28/2014   TRIG 76 10/08/2014   CHOLHDL 6.8* 05/14/2013   Lab Results  Component Value Date   HGBA1C 6.2 10/08/2014        ASSESSMENT AND PLAN  Multiple sclerosis  Spastic gait  Right foot drop  Urinary disorder  Insomnia  Other fatigue   1.   Continue Betaseron. 2.  He is doing much better on a combination of tamsulosin with oxybutynin and this will be continued. 3.  We discussed that if the insomnia becomes more of a problem I could consider adding trazodone or other medication at bedtime. 4.  He is very active and strives to continue to as much as he can.    If gait worsens, he might do better with a brace that also includes the knee He will return to see me in 6 months or sooner if he has new or worsening neurologic symptoms.   Richard A. Felecia Shelling, MD, PhD 10/18/345, 4:25 AM Certified in Neurology, Clinical Neurophysiology, Sleep Medicine, Pain Medicine and Neuroimaging  [facility] [address], [place], [place] [address] [phone])  [phone]    

## 2015-02-19 ENCOUNTER — Encounter: Payer: Self-pay | Admitting: Family Medicine

## 2015-02-19 ENCOUNTER — Ambulatory Visit (INDEPENDENT_AMBULATORY_CARE_PROVIDER_SITE_OTHER): Payer: PPO | Admitting: Family Medicine

## 2015-02-19 ENCOUNTER — Telehealth: Payer: Self-pay | Admitting: Family Medicine

## 2015-02-19 VITALS — BP 105/65 | HR 64 | Temp 97.1°F | Ht 66.0 in | Wt 152.0 lb

## 2015-02-19 DIAGNOSIS — E039 Hypothyroidism, unspecified: Secondary | ICD-10-CM | POA: Diagnosis not present

## 2015-02-19 DIAGNOSIS — I2510 Atherosclerotic heart disease of native coronary artery without angina pectoris: Secondary | ICD-10-CM

## 2015-02-19 DIAGNOSIS — L989 Disorder of the skin and subcutaneous tissue, unspecified: Secondary | ICD-10-CM | POA: Diagnosis not present

## 2015-02-19 DIAGNOSIS — I10 Essential (primary) hypertension: Secondary | ICD-10-CM | POA: Diagnosis not present

## 2015-02-19 DIAGNOSIS — J301 Allergic rhinitis due to pollen: Secondary | ICD-10-CM

## 2015-02-19 DIAGNOSIS — D519 Vitamin B12 deficiency anemia, unspecified: Secondary | ICD-10-CM | POA: Diagnosis not present

## 2015-02-19 DIAGNOSIS — G35 Multiple sclerosis: Secondary | ICD-10-CM

## 2015-02-19 DIAGNOSIS — E1169 Type 2 diabetes mellitus with other specified complication: Secondary | ICD-10-CM

## 2015-02-19 DIAGNOSIS — E785 Hyperlipidemia, unspecified: Secondary | ICD-10-CM

## 2015-02-19 DIAGNOSIS — E119 Type 2 diabetes mellitus without complications: Secondary | ICD-10-CM | POA: Diagnosis not present

## 2015-02-19 DIAGNOSIS — E559 Vitamin D deficiency, unspecified: Secondary | ICD-10-CM | POA: Diagnosis not present

## 2015-02-19 LAB — POCT GLYCOSYLATED HEMOGLOBIN (HGB A1C): HEMOGLOBIN A1C: 6.4

## 2015-02-19 LAB — POCT CBC
Granulocyte percent: 76.2 %G (ref 37–80)
HEMATOCRIT: 36.2 % — AB (ref 43.5–53.7)
Hemoglobin: 11.3 g/dL — AB (ref 14.1–18.1)
Lymph, poc: 1 (ref 0.6–3.4)
MCH, POC: 25.5 pg — AB (ref 27–31.2)
MCHC: 31.3 g/dL — AB (ref 31.8–35.4)
MCV: 81.5 fL (ref 80–97)
MPV: 7.7 fL (ref 0–99.8)
POC GRANULOCYTE: 4.8 (ref 2–6.9)
POC LYMPH %: 16.3 % (ref 10–50)
Platelet Count, POC: 173 10*3/uL (ref 142–424)
RBC: 4.44 M/uL — AB (ref 4.69–6.13)
RDW, POC: 16.1 %
WBC: 6.3 10*3/uL (ref 4.6–10.2)

## 2015-02-19 LAB — POCT UA - MICROALBUMIN: MICROALBUMIN (UR) POC: NEGATIVE mg/L

## 2015-02-19 MED ORDER — EZETIMIBE 10 MG PO TABS: 10.0000 mg | ORAL_TABLET | Freq: Every day | ORAL | Status: DC

## 2015-02-19 MED ORDER — "ALLERGY SYRINGE 27G X 1/2"" 1 ML MISC": Status: DC

## 2015-02-19 MED ORDER — CYANOCOBALAMIN 1000 MCG/ML IJ SOLN: 1000.0000 ug | Freq: Once | INTRAMUSCULAR | Status: DC

## 2015-02-19 MED ORDER — FLUTICASONE PROPIONATE 50 MCG/ACT NA SUSP: NASAL | Status: DC

## 2015-02-19 NOTE — Patient Instructions (Addendum)
Medicare Annual Wellness Visit  [facility] and the medical providers at [facility] strive to bring you the best medical care.  In doing so we not only want to address your current medical conditions and concerns but also to detect new conditions early and prevent illness, disease and health-related problems.    Medicare offers a yearly Wellness Visit which allows our clinical staff to assess your need for preventative services including immunizations, lifestyle education, counseling to decrease risk of preventable diseases and screening for fall risk and other medical concerns.    This visit is provided free of charge (no copay) for all Medicare recipients. The clinical pharmacists at [facility] have begun to conduct these Wellness Visits which will also include a thorough review of all your medications.    As you primary medical provider recommend that you make an appointment for your Annual Wellness Visit if you have not done so already this year.  You may set up this appointment before you leave today or you may call back ([phone]) and schedule an appointment.  Please make sure when you call that you mention that you are scheduling your Annual Wellness Visit with the clinical pharmacist so that the appointment may be made for the proper length of time.     Continue current medications. Continue good therapeutic lifestyle changes which include good diet and exercise. Fall precautions discussed with patient. If an FOBT was given today- please return it to our front desk. If you are over 59 years old - you may need Prevnar 6 or the adult Pneumonia vaccine.   After your visit with [place] today you will receive a survey in the mail or online from [employer] regarding your care with [place]. Please take a moment to fill this out. Your feedback is very important to [place] as you can help [place] better understand your patient needs as well as  improve your experience and satisfaction. WE CARE ABOUT YOU!!!   Please continue to follow-up with neurologist for MS Please contact Denyse Amass and see if she can help you with keeping the cost of your prescriptions at a minimum Use nasal saline and use Flonase at nighttime prior to going to bed 1-2 sprays each nostril for allergic rhinitis

## 2015-02-19 NOTE — Progress Notes (Signed)
Subjective:    [name], male    DOB: [birth year], 64 y.o.   MRN: [id number]  HPI Pt here for follow up and management of chronic medical problems which includes hypertension, diabetes, and hyperlipidemia. He is taking medications regularly. The patient also has MS. He sees a neurologist in [facility] regularly for this. He is concerned about a lesion near his right eye. He is also concerned about the cost of his medicines and wondering if he can take B12 at home. The patient denies chest pain or shortness of breath. He is followed regularly because of renal stones by the urologist. He also does his rectal exam and prostate exam. He also sees the neurologist regularly for his MS and that is stable at the current time. He denies chest pain shortness of breath trouble swallowing and heartburn indigestion nausea vomiting diarrhea or blood in the stool. He also is passing his water without problems. The skin lesion on his right eye has been there like a scratch for a while but suddenly in the past few weeks has blossomed into a full-blown cancerous lesion for which she will need to see the dermatologist as soon as possible.      Patient Active Problem List   Diagnosis Date Noted  . Right foot drop 02/17/2015  . Urinary disorder 02/17/2015  . Insomnia 02/17/2015  . Other fatigue 02/17/2015  . 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 Bladder disorder 09/16/2014  . Multiple sclerosis 01/06/2013  . Ureteral calculus, right 10/30/2011  . Bladder calculus 10/30/2011  . Tobacco use 09/27/2011  . CORONARY ATHEROSCLEROSIS, NATIVE VESSEL 10/06/2009  . Diabetes mellitus type 2 in nonobese 10/01/2009  . Hyperlipidemia associated with type 2 diabetes mellitus 10/01/2009  . Essential hypertension 10/01/2009   Outpatient Encounter Prescriptions as of 02/19/2015  Medication Sig  . amLODipine (NORVASC) 2.5 MG tablet TAKE ONE TABLET BY MOUTH EVERY MORNING  . aspirin (LONGS ADULT LOW STRENGTH ASA) 81 MG  EC tablet Take 81 mg by mouth daily.   . cholecalciferol (VITAMIN D) 1000 UNITS tablet Take 1,000 Units by mouth daily.  Marland Kitchen doxycycline (VIBRA-TABS) 100 MG tablet TAKE ONE TABLET BY MOUTH TWICE DAILY (Patient taking differently: one tab QOD)  . fish oil-omega-3 fatty acids 1000 MG capsule Take 2 g by mouth 2 (two) times daily.  Marland Kitchen glucose blood test strip Use as instructed  . interferon beta-1b (BETASERON) 0.3 MG injection Inject 0.25 mg into the skin every other day.   . isosorbide mononitrate (IMDUR) 60 MG 24 hr tablet TAKE ONE-HALF (1/2) TABLET (30MG ) BY MOUTH EVERY DAY  . levothyroxine (LEVOTHROID) 25 MCG tablet Take 1 tablet (25 mcg total) by mouth daily before breakfast.  . lisinopril (PRINIVIL,ZESTRIL) 10 MG tablet TAKE ONE TABLET BY MOUTH EVERY DAY  . metFORMIN (GLUCOPHAGE) 500 MG tablet TAKE TWO TABLETS (1000MG ) BY MOUTH TWICEDAILY WITH MEALS  . omeprazole (PRILOSEC) 20 MG capsule Take 20 mg by mouth daily.  Marland Kitchen oxybutynin (DITROPAN) 5 MG tablet Take 1 tablet (5 mg total) by mouth 3 (three) times daily.  . pioglitazone (ACTOS) 30 MG tablet TAKE ONE TABLET BY MOUTH EVERY DAY  . rosuvastatin (CRESTOR) 20 MG tablet Take 1 tablet (20 mg total) by mouth daily. As directed  . Tamsulosin HCl (FLOMAX) 0.4 MG CAPS Take 0.4 mg by mouth daily after supper.   . ezetimibe (ZETIA) 10 MG tablet Take 10 mg by mouth daily.   Facility-Administered Encounter Medications as of 02/19/2015  Medication  .  cyanocobalamin ((VITAMIN B-12)) injection 1,000 mcg      Review of Systems  Constitutional: Negative.   HENT: Negative.   Eyes: Negative.   Respiratory: Negative.   Cardiovascular: Negative.   Gastrointestinal: Negative.   Endocrine: Negative.   Genitourinary: Negative.   Musculoskeletal: Negative.   Skin: Negative.        Lesion near right eye  Allergic/Immunologic: Negative.   Neurological: Negative.   Hematological: Negative.   Psychiatric/Behavioral: Negative.        Objective:   Physical  Exam  Constitutional: He is oriented to person, place, and time. He appears well-developed and well-nourished. No distress.  Pleasant and alert  HENT:  Head: Normocephalic and atraumatic.  Right Ear: External ear normal.  Left Ear: External ear normal.  Mouth/Throat: Oropharynx is clear and moist. No oropharyngeal exudate.  Nasal turbinate congestion bilaterally. There is a 1 12:45 half inch cancerous skin lesion in the right orbital area that is ulcerated in the center with raised shiny edges.  Eyes: Conjunctivae and EOM are normal. Pupils are equal, round, and reactive to light. Right eye exhibits no discharge. Left eye exhibits no discharge. No scleral icterus.  Neck: Normal range of motion. Neck supple. No thyromegaly present.  No carotid bruits thyromegaly or anterior cervical adenopathy  Cardiovascular: Normal rate, regular rhythm, normal heart sounds and intact distal pulses.   No murmur heard. At 72/m with regular rate and rhythm  Pulmonary/Chest: Effort normal and breath sounds normal. No respiratory distress. He has no wheezes. He has no rales. He exhibits no tenderness.  Clear anteriorly and posteriorly  Abdominal: Soft. Bowel sounds are normal. He exhibits no mass. There is no tenderness. There is no rebound and no guarding.  Nontender without masses or organ enlargement  Genitourinary:  This exam is done by the urologist on an annual basis and will be done again sometime in early 2017.  Musculoskeletal: He exhibits no edema or tenderness.  The patient has weakness and uses a cane for walking because of the MS which affects his right lower extremity  Lymphadenopathy:    He has no cervical adenopathy.  Neurological: He is alert and oriented to person, place, and time. He has normal reflexes. No cranial nerve deficit.  The reflexes of the right lower extremity are slightly exaggerated compared to those on the left.  Skin: Skin is warm and dry. No rash noted. No erythema. No  pallor.  Cancerous skin lesion right lateral orbit of face  Psychiatric: He has a normal mood and affect. His behavior is normal. Judgment and thought content normal.  Nursing note and vitals reviewed.  BP 105/65 mmHg  Pulse 64  Temp(Src) 97.1 F (36.2 C) (Oral)  Ht 5\' 6"  (1.676 m)  Wt 152 lb (68.947 kg)  BMI 24.55 kg/m2        Assessment & Plan:  1. B12 deficiency anemia -The patient's wife will come by our office to make sure that she can give the B12 injections at home for the patient she will be instructed by one of the nurses with the clinical pharmacists in doing this. - POCT CBC  2. Multiple sclerosis -The patient will consent to see his neurologist in [facility] and he is doing fairly well with this disease especially with the current treatment he is receiving - POCT CBC  3. Essential hypertension -The blood pressure is good today and he will continue with current treatment - POCT CBC  4. Diabetes mellitus type 2 in nonobese -Continue  current treatment pending results of A1c - POCT CBC - POCT UA - Microalbumin - POCT glycosylated hemoglobin (Hb A1C)  5. Atherosclerosis of native coronary artery of native heart without angina pectoris -Continue with Crestor and aggressive therapeutic lifestyle changes - POCT CBC - NMR, lipoprofile  6. Vitamin D deficiency -Continue with vitamin D replacement pending results of lab work - POCT CBC - Vit D  25 hydroxy (rtn osteoporosis monitoring)  7. Hyperlipidemia associated with type 2 diabetes mellitus -Continue with statin drug and diabetes management and aggressive therapeutic lifestyle changes - POCT CBC - NMR, lipoprofile  8. Hypothyroidism, unspecified hypothyroidism type -Take medication as directed first thing upon arising in the morning - Thyroid Panel With TSH  9. Skin lesion of face -This is most likely a cancerous skin lesion and the patient will be referred to dermatology for removal and diagnosis as soon  as possible - Ambulatory referral to Dermatology  10. Allergic rhinitis due to pollen -A she currently has a lot of morning congestion and he will use the Flonase regularly to see if this helps - fluticasone (FLONASE) 50 MCG/ACT nasal spray; One to 2 sprays each nostril at bedtime  Dispense: 16 g; Refill: 6  Patient Instructions                       Medicare Annual Wellness Visit  [facility] and the medical providers at [facility] strive to bring you the best medical care.  In doing so we not only want to address your current medical conditions and concerns but also to detect new conditions early and prevent illness, disease and health-related problems.    Medicare offers a yearly Wellness Visit which allows our clinical staff to assess your need for preventative services including immunizations, lifestyle education, counseling to decrease risk of preventable diseases and screening for fall risk and other medical concerns.    This visit is provided free of charge (no copay) for all Medicare recipients. The clinical pharmacists at [facility] have begun to conduct these Wellness Visits which will also include a thorough review of all your medications.    As you primary medical provider recommend that you make an appointment for your Annual Wellness Visit if you have not done so already this year.  You may set up this appointment before you leave today or you may call back ([phone]) and schedule an appointment.  Please make sure when you call that you mention that you are scheduling your Annual Wellness Visit with the clinical pharmacist so that the appointment may be made for the proper length of time.     Continue current medications. Continue good therapeutic lifestyle changes which include good diet and exercise. Fall precautions discussed with patient. If an FOBT was given today- please return it to our front desk. If you are over 40 years  old - you may need Prevnar 43 or the adult Pneumonia vaccine.   After your visit with [place] today you will receive a survey in the mail or online from [employer] regarding your care with [place]. Please take a moment to fill this out. Your feedback is very important to [place] as you can help [place] better understand your patient needs as well as improve your experience and satisfaction. WE CARE ABOUT YOU!!!   Please continue to follow-up with neurologist for MS Please contact Denyse Amass and see if she can help you with keeping the cost of your prescriptions  at a minimum Use nasal saline and use Flonase at nighttime prior to going to bed 1-2 sprays each nostril for allergic rhinitis   Arrie Senate MD

## 2015-02-20 LAB — THYROID PANEL WITH TSH
FREE THYROXINE INDEX: 2.4 (ref 1.2–4.9)
T3 UPTAKE RATIO: 25 % (ref 24–39)
T4, Total: 9.5 ug/dL (ref 4.5–12.0)
TSH: 3.43 u[IU]/mL (ref 0.450–4.500)

## 2015-02-20 LAB — VITAMIN D 25 HYDROXY (VIT D DEFICIENCY, FRACTURES): VIT D 25 HYDROXY: 46 ng/mL (ref 30.0–100.0)

## 2015-02-20 LAB — NMR, LIPOPROFILE
CHOLESTEROL: 131 mg/dL (ref 100–199)
HDL Cholesterol by NMR: 33 mg/dL — ABNORMAL LOW (ref >39)
HDL PARTICLE NUMBER: 26.8 umol/L — AB (ref >=30.5)
LDL PARTICLE NUMBER: 1188 nmol/L — AB (ref <1000)
LDL SIZE: 19.8 nm (ref >20.5)
LDL-C: 81 mg/dL (ref 0–99)
LP-IR SCORE: 50 — AB (ref <=45)
Small LDL Particle Number: 939 nmol/L — ABNORMAL HIGH (ref <=527)
Triglycerides by NMR: 86 mg/dL (ref 0–149)

## 2015-02-24 LAB — HM DIABETES EYE EXAM

## 2015-02-26 ENCOUNTER — Ambulatory Visit: Payer: PPO

## 2015-02-26 NOTE — Telephone Encounter (Signed)
Patient will be seen 8/16 for b12 injection and instructions on how to take.  This encounter will now be closed.

## 2015-03-01 ENCOUNTER — Encounter: Payer: Self-pay | Admitting: *Deleted

## 2015-03-01 ENCOUNTER — Telehealth: Payer: Self-pay | Admitting: Neurology

## 2015-03-01 ENCOUNTER — Other Ambulatory Visit: Payer: Self-pay | Admitting: Physician Assistant

## 2015-03-01 NOTE — Telephone Encounter (Signed)
Pt called to advise he is going to drop off insurance papers that need to be filled out

## 2015-03-01 NOTE — Telephone Encounter (Signed)
noted/fim 

## 2015-03-02 ENCOUNTER — Ambulatory Visit (INDEPENDENT_AMBULATORY_CARE_PROVIDER_SITE_OTHER): Payer: PPO | Admitting: *Deleted

## 2015-03-02 DIAGNOSIS — E538 Deficiency of other specified B group vitamins: Secondary | ICD-10-CM | POA: Diagnosis not present

## 2015-03-02 DIAGNOSIS — D519 Vitamin B12 deficiency anemia, unspecified: Secondary | ICD-10-CM

## 2015-03-02 NOTE — Patient Instructions (Signed)

## 2015-03-02 NOTE — Progress Notes (Signed)
Pt came in today to receive B12 injection and so his wife could watch and learn how to give the injection at home. Pt tolerated well.

## 2015-03-04 ENCOUNTER — Other Ambulatory Visit: Payer: Self-pay | Admitting: Family Medicine

## 2015-04-08 ENCOUNTER — Other Ambulatory Visit: Payer: Self-pay | Admitting: *Deleted

## 2015-04-08 MED ORDER — PIOGLITAZONE HCL 30 MG PO TABS: 30.0000 mg | ORAL_TABLET | Freq: Every day | ORAL | Status: DC

## 2015-04-08 MED ORDER — METFORMIN HCL 500 MG PO TABS: ORAL_TABLET | ORAL | Status: DC

## 2015-04-08 MED ORDER — LISINOPRIL 10 MG PO TABS: 10.0000 mg | ORAL_TABLET | Freq: Every day | ORAL | Status: DC

## 2015-04-08 MED ORDER — DOXYCYCLINE HYCLATE 100 MG PO TABS: 100.0000 mg | ORAL_TABLET | Freq: Two times a day (BID) | ORAL | Status: DC

## 2015-06-16 ENCOUNTER — Other Ambulatory Visit: Payer: Self-pay

## 2015-06-16 MED ORDER — INTERFERON BETA-1B 0.3 MG ~~LOC~~ KIT: 0.2500 mg | PACK | SUBCUTANEOUS | Status: DC

## 2015-06-24 ENCOUNTER — Other Ambulatory Visit: Payer: Self-pay | Admitting: *Deleted

## 2015-06-24 MED ORDER — LEVOTHYROXINE SODIUM 25 MCG PO TABS: 25.0000 ug | ORAL_TABLET | Freq: Every day | ORAL | Status: DC

## 2015-07-03 ENCOUNTER — Other Ambulatory Visit: Payer: Self-pay | Admitting: *Deleted

## 2015-07-03 MED ORDER — ISOSORBIDE MONONITRATE ER 60 MG PO TB24: ORAL_TABLET | ORAL | Status: DC

## 2015-07-05 ENCOUNTER — Ambulatory Visit (INDEPENDENT_AMBULATORY_CARE_PROVIDER_SITE_OTHER): Payer: PPO | Admitting: Family Medicine

## 2015-07-05 ENCOUNTER — Encounter: Payer: Self-pay | Admitting: Family Medicine

## 2015-07-05 VITALS — BP 138/75 | HR 60 | Temp 97.0°F | Ht 66.0 in | Wt 159.0 lb

## 2015-07-05 DIAGNOSIS — E119 Type 2 diabetes mellitus without complications: Secondary | ICD-10-CM

## 2015-07-05 DIAGNOSIS — E559 Vitamin D deficiency, unspecified: Secondary | ICD-10-CM | POA: Diagnosis not present

## 2015-07-05 DIAGNOSIS — I10 Essential (primary) hypertension: Secondary | ICD-10-CM

## 2015-07-05 DIAGNOSIS — L89320 Pressure ulcer of left buttock, unstageable: Secondary | ICD-10-CM

## 2015-07-05 DIAGNOSIS — E039 Hypothyroidism, unspecified: Secondary | ICD-10-CM

## 2015-07-05 DIAGNOSIS — E785 Hyperlipidemia, unspecified: Secondary | ICD-10-CM

## 2015-07-05 DIAGNOSIS — I2510 Atherosclerotic heart disease of native coronary artery without angina pectoris: Secondary | ICD-10-CM | POA: Diagnosis not present

## 2015-07-05 DIAGNOSIS — E1169 Type 2 diabetes mellitus with other specified complication: Secondary | ICD-10-CM | POA: Diagnosis not present

## 2015-07-05 DIAGNOSIS — G35 Multiple sclerosis: Secondary | ICD-10-CM | POA: Diagnosis not present

## 2015-07-05 DIAGNOSIS — Z23 Encounter for immunization: Secondary | ICD-10-CM | POA: Diagnosis not present

## 2015-07-05 LAB — POCT GLYCOSYLATED HEMOGLOBIN (HGB A1C): HEMOGLOBIN A1C: 6.9

## 2015-07-05 MED ORDER — ISOSORBIDE MONONITRATE ER 60 MG PO TB24: ORAL_TABLET | ORAL | Status: DC

## 2015-07-05 MED ORDER — MUPIROCIN 2 % EX OINT: 1.0000 "application " | TOPICAL_OINTMENT | Freq: Two times a day (BID) | CUTANEOUS | Status: DC

## 2015-07-05 NOTE — Patient Instructions (Addendum)
Medicare Annual Wellness Visit  [facility] and the medical providers at [facility] strive to bring you the best medical care.  In doing so we not only want to address your current medical conditions and concerns but also to detect new conditions early and prevent illness, disease and health-related problems.    Medicare offers a yearly Wellness Visit which allows our clinical staff to assess your need for preventative services including immunizations, lifestyle education, counseling to decrease risk of preventable diseases and screening for fall risk and other medical concerns.    This visit is provided free of charge (no copay) for all Medicare recipients. The clinical pharmacists at [facility] have begun to conduct these Wellness Visits which will also include a thorough review of all your medications.    As you primary medical provider recommend that you make an appointment for your Annual Wellness Visit if you have not done so already this year.  You may set up this appointment before you leave today or you may call back [phone] and schedule an appointment.  Please make sure when you call that you mention that you are scheduling your Annual Wellness Visit with the clinical pharmacist so that the appointment may be made for the proper length of time.     Continue current medications. Continue good therapeutic lifestyle changes which include good diet and exercise. Fall precautions discussed with patient. If an FOBT was given today- please return it to our front desk. If you are over 65 years old - you may need Prevnar 77 or the adult Pneumonia vaccine.  **Flu shots are available--- please call and schedule a FLU-CLINIC appointment**  After your visit with [place] today you will receive a survey in the mail or online from [employer] regarding your care with [place]. Please take a moment to fill this out. Your feedback is very  important to [place] as you can help [place] better understand your patient needs as well as improve your experience and satisfaction. WE CARE ABOUT YOU!!!   The patient should use some saline soaks on the eschar a couple times a day for 20 minutes. He should apply some Bactroban ointment sparingly to this ulcer crater after soaking. He should continue to follow-up with the neurologist for his MS, the urologist for his elevated PSA, and the cardiologist as planned. He should use Debrox eardrops to the left ear 3-4 drops nightly for 3 nights and wait 1 week and repeat and return to the office for ear irrigation in a couple weeks Continue to watch diet closely and monitor blood sugars at home and stay as active as possible

## 2015-07-05 NOTE — Progress Notes (Signed)
Subjective:    [name], male    DOB: June 01, 1951, 64 y.o.   MRN: [id number]  HPI Pt here for follow up and management of chronic medical problems which includes diabetes,  hypothyroid, hyperlipidemia and hypertension. He is taking medications regularly. The patient is complaining of a sore from an injection on his hip. The injection for his MS and is supposed to be subcutaneous and her supposed to rub this out after giving it. The ulcer developed after an injection to this area. The patient sees the neurologist about every 6 months and he has an appointment with the urologist with a PSA due in February 2017. He denies chest pain shortness of breath trouble swallowing and heartburn indigestion and nausea vomiting diarrhea or blood in the stool. The patient will get his flu shot today.     Patient Active Problem List   Diagnosis Date Noted  . Right foot drop 02/17/2015  . Urinary disorder 02/17/2015  . Insomnia 02/17/2015  . Other fatigue 02/17/2015  . 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 Bladder disorder 09/16/2014  . Multiple sclerosis ([place]) 01/06/2013  . Ureteral calculus, right 10/30/2011  . Bladder calculus 10/30/2011  . Tobacco use 09/27/2011  . CORONARY ATHEROSCLEROSIS, NATIVE VESSEL 10/06/2009  . Diabetes mellitus type 2 in nonobese ([place]) 10/01/2009  . Hyperlipidemia associated with type 2 diabetes mellitus ([place]) 10/01/2009  . Essential hypertension 10/01/2009   Outpatient Encounter Prescriptions as of 07/05/2015  Medication Sig  . amLODipine (NORVASC) 2.5 MG tablet TAKE ONE TABLET BY MOUTH EVERY MORNING  . aspirin (LONGS ADULT LOW STRENGTH ASA) 81 MG EC tablet Take 81 mg by mouth daily.   . cholecalciferol (VITAMIN D) 1000 UNITS tablet Take 1,000 Units by mouth daily.  . cyanocobalamin (,VITAMIN B-12,) 1000 MCG/ML injection Inject 1 mL (1,000 mcg total) into the skin once.  . ezetimibe (ZETIA) 10 MG tablet Take 1 tablet (10 mg total) by mouth daily.  . fish  oil-omega-3 fatty acids 1000 MG capsule Take 2 g by mouth 2 (two) times daily.  . fluticasone (FLONASE) 50 MCG/ACT nasal spray One to 2 sprays each nostril at bedtime  . glucose blood test strip Use as instructed  . interferon beta-1b (BETASERON) 0.3 MG injection Inject 0.25 mg into the skin every other day.   . Interferon Beta-1b (BETASERON) 0.3 MG KIT injection Inject 0.25 mg into the skin every other day.  . isosorbide mononitrate (IMDUR) 60 MG 24 hr tablet TAKE ONE-HALF (1/2) TABLET (30MG) BY MOUTH EVERY DAY  . levothyroxine (LEVOTHROID) 25 MCG tablet Take 1 tablet (25 mcg total) by mouth daily before breakfast.  . lisinopril (PRINIVIL,ZESTRIL) 10 MG tablet Take 1 tablet (10 mg total) by mouth daily.  . metFORMIN (GLUCOPHAGE) 500 MG tablet TAKE TWO TABLETS (1000MG) BY MOUTH TWICEDAILY WITH MEALS  . omeprazole (PRILOSEC) 20 MG capsule Take 20 mg by mouth daily.  Marland Kitchen oxybutynin (DITROPAN) 5 MG tablet Take 1 tablet (5 mg total) by mouth 3 (three) times daily.  . pioglitazone (ACTOS) 30 MG tablet Take 1 tablet (30 mg total) by mouth daily.  . rosuvastatin (CRESTOR) 20 MG tablet Take 1 tablet (20 mg total) by mouth daily. As directed  . Tamsulosin HCl (FLOMAX) 0.4 MG CAPS Take 0.4 mg by mouth daily after supper.   . Tuberculin-Allergy Syringes (ALLERGY SYRINGE 1CC/27GX1/2") 27G X 1/2" 1 ML MISC Injection once monthly- B 12 shots  . [DISCONTINUED] doxycycline (VIBRA-TABS) 100 MG tablet Take 1 tablet (100 mg total) by  mouth 2 (two) times daily.  . [DISCONTINUED] isosorbide mononitrate (IMDUR) 60 MG 24 hr tablet TAKE ONE-HALF (1/2) TABLET (30MG) BY MOUTH EVERY DAY   Facility-Administered Encounter Medications as of 07/05/2015  Medication  . cyanocobalamin ((VITAMIN B-12)) injection 1,000 mcg       Review of Systems  Constitutional: Negative.   HENT: Negative.   Eyes: Negative.   Respiratory: Negative.   Cardiovascular: Negative.   Gastrointestinal: Negative.   Endocrine: Negative.     Genitourinary: Negative.   Musculoskeletal: Negative.   Skin: Negative.        Area - left side painful   Allergic/Immunologic: Negative.   Neurological: Negative.   Hematological: Negative.   Psychiatric/Behavioral: Negative.        Objective:   Physical Exam  Constitutional: He is oriented to person, place, and time. He appears well-developed and well-nourished. No distress.  HENT:  Head: Normocephalic and atraumatic.  Right Ear: External ear normal.  Mouth/Throat: Oropharynx is clear and moist. No oropharyngeal exudate.  Impacted cerumen left ear canal. Nasal congestion bilaterally left greater than right  Eyes: Conjunctivae and EOM are normal. Pupils are equal, round, and reactive to light. Right eye exhibits no discharge. Left eye exhibits no discharge. No scleral icterus.  Neck: Normal range of motion. Neck supple. No thyromegaly present.  Neck without thyromegaly bruits or adenopathy  Cardiovascular: Normal rate, regular rhythm, normal heart sounds and intact distal pulses.  Exam reveals no friction rub.   No murmur heard. The heart is regular at 60/m  Pulmonary/Chest: Effort normal and breath sounds normal. No respiratory distress. He has no wheezes. He has no rales. He exhibits no tenderness.  There is no axillary masses or adenopathy  Abdominal: Soft. Bowel sounds are normal. He exhibits no mass. There is no tenderness. There is no rebound and no guarding.  The abdomen is soft without masses or tenderness  Genitourinary:  The patient will see the urologist in February  Musculoskeletal: He exhibits no edema or tenderness.  The patient uses a cane and has a limp secondary to his MS.  Lymphadenopathy:    He has no cervical adenopathy.  Neurological: He is alert and oriented to person, place, and time. He has normal reflexes. No cranial nerve deficit.  Skin: Skin is warm and dry. No rash noted.  Psychiatric: He has a normal mood and affect. His behavior is normal.  Judgment and thought content normal.  Nursing note and vitals reviewed.  BP 138/75 mmHg  Pulse 60  Temp(Src) 97 F (36.1 C) (Oral)  Ht 5' 6"  (1.676 m)  Wt 159 lb (72.122 kg)  BMI 25.68 kg/m2        Assessment & Plan:  1. Essential hypertension -The blood pressure is good today the patient will continue with his current treatment - BMP8+EGFR - Hepatic function panel - CBC with Differential/Platelet  2. Multiple sclerosis ([place]) -Continue to follow-up with neurology every 6 months - CBC with Differential/Platelet  3. Diabetes mellitus type 2 in nonobese [facility]) -Continue current treatment pending results of lab work and check blood sugars as much as possible at home - POCT glycosylated hemoglobin (Hb A1C) - BMP8+EGFR - CBC with Differential/Platelet  4. Atherosclerosis of native coronary artery of native heart without angina pectoris -Continue to follow-up with cardiology - CBC with Differential/Platelet - NMR, lipoprofile  5. Vitamin D deficiency -Continue current treatment pending results of lab work - CBC with Differential/Platelet - VITAMIN D 25 Hydroxy (Vit-D Deficiency, Fractures)  6. Hyperlipidemia associated  with type 2 diabetes mellitus ([place]) -Continue aggressive therapeutic lifestyle changes and good blood sugar control and is much exercise as possible - CBC with Differential/Platelet - NMR, lipoprofile  7. Hypothyroidism, unspecified hypothyroidism type -Continue current treatment pending results of lab work - CBC with Differential/Platelet - Thyroid Panel With TSH  8. Decubitus ulcer of left buttock, unstageable ([facility]) -Use saline compresses a couple times daily and use antibiotic ointment sparingly after warm saline compresses  Meds ordered this encounter  Medications  . isosorbide mononitrate (IMDUR) 60 MG 24 hr tablet    Sig: TAKE ONE-HALF (1/2) TABLET (30MG) BY MOUTH EVERY DAY    Dispense:  15 tablet    Refill:  1  . mupirocin ointment  (BACTROBAN) 2 %    Sig: Apply 1 application topically 2 (two) times daily.    Dispense:  22 g    Refill:  1   Patient Instructions                       Medicare Annual Wellness Visit   and the medical providers at [facility] strive to bring you the best medical care.  In doing so we not only want to address your current medical conditions and concerns but also to detect new conditions early and prevent illness, disease and health-related problems.    Medicare offers a yearly Wellness Visit which allows our clinical staff to assess your need for preventative services including immunizations, lifestyle education, counseling to decrease risk of preventable diseases and screening for fall risk and other medical concerns.    This visit is provided free of charge (no copay) for all Medicare recipients. The clinical pharmacists at [facility] have begun to conduct these Wellness Visits which will also include a thorough review of all your medications.    As you primary medical provider recommend that you make an appointment for your Annual Wellness Visit if you have not done so already this year.  You may set up this appointment before you leave today or you may call back ([phone]) and schedule an appointment.  Please make sure when you call that you mention that you are scheduling your Annual Wellness Visit with the clinical pharmacist so that the appointment may be made for the proper length of time.     Continue current medications. Continue good therapeutic lifestyle changes which include good diet and exercise. Fall precautions discussed with patient. If an FOBT was given today- please return it to our front desk. If you are over 65 years old - you may need Prevnar 86 or the adult Pneumonia vaccine.  **Flu shots are available--- please call and schedule a FLU-CLINIC appointment**  After your visit with [place] today you will receive a  survey in the mail or online from [employer] regarding your care with [place]. Please take a moment to fill this out. Your feedback is very important to [place] as you can help [place] better understand your patient needs as well as improve your experience and satisfaction. WE CARE ABOUT YOU!!!   The patient should use some saline soaks on the eschar a couple times a day for 20 minutes. He should apply some Bactroban ointment sparingly to this ulcer crater after soaking. He should continue to follow-up with the neurologist for his MS, the urologist for his elevated PSA, and the cardiologist as planned. He should use Debrox eardrops to the left ear 3-4 drops nightly for 3 nights and  wait 1 week and repeat and return to the office for ear irrigation in a couple weeks Continue to watch diet closely and monitor blood sugars at home and stay as active as possible   Arrie Senate MD

## 2015-07-06 LAB — CBC WITH DIFFERENTIAL/PLATELET
BASOS: 0 %
Basophils Absolute: 0 10*3/uL (ref 0.0–0.2)
EOS (ABSOLUTE): 0.1 10*3/uL (ref 0.0–0.4)
Eos: 1 %
Hematocrit: 33.9 % — ABNORMAL LOW (ref 37.5–51.0)
Hemoglobin: 11.4 g/dL — ABNORMAL LOW (ref 12.6–17.7)
IMMATURE GRANULOCYTES: 0 %
Immature Grans (Abs): 0 10*3/uL (ref 0.0–0.1)
Lymphocytes Absolute: 0.6 10*3/uL — ABNORMAL LOW (ref 0.7–3.1)
Lymphs: 10 %
MCH: 27.6 pg (ref 26.6–33.0)
MCHC: 33.6 g/dL (ref 31.5–35.7)
MCV: 82 fL (ref 79–97)
MONOS ABS: 0.6 10*3/uL (ref 0.1–0.9)
Monocytes: 9 %
NEUTROS PCT: 80 %
Neutrophils Absolute: 5.1 10*3/uL (ref 1.4–7.0)
PLATELETS: 194 10*3/uL (ref 150–379)
RBC: 4.13 x10E6/uL — ABNORMAL LOW (ref 4.14–5.80)
RDW: 15.5 % — AB (ref 12.3–15.4)
WBC: 6.3 10*3/uL (ref 3.4–10.8)

## 2015-07-06 LAB — HEPATIC FUNCTION PANEL
ALK PHOS: 63 IU/L (ref 39–117)
ALT: 17 IU/L (ref 0–44)
AST: 19 IU/L (ref 0–40)
Albumin: 4.3 g/dL (ref 3.6–4.8)
Bilirubin Total: 0.3 mg/dL (ref 0.0–1.2)
Bilirubin, Direct: 0.09 mg/dL (ref 0.00–0.40)
Total Protein: 6.8 g/dL (ref 6.0–8.5)

## 2015-07-06 LAB — BMP8+EGFR
BUN/Creatinine Ratio: 21 (ref 10–22)
BUN: 21 mg/dL (ref 8–27)
CO2: 24 mmol/L (ref 18–29)
Calcium: 9.7 mg/dL (ref 8.6–10.2)
Chloride: 103 mmol/L (ref 96–106)
Creatinine, Ser: 1 mg/dL (ref 0.76–1.27)
GFR calc Af Amer: 91 mL/min/{1.73_m2} (ref >59)
GFR calc non Af Amer: 79 mL/min/{1.73_m2} (ref >59)
GLUCOSE: 129 mg/dL — AB (ref 65–99)
Potassium: 4 mmol/L (ref 3.5–5.2)
Sodium: 142 mmol/L (ref 134–144)

## 2015-07-06 LAB — THYROID PANEL WITH TSH
Free Thyroxine Index: 2.8 (ref 1.2–4.9)
T3 Uptake Ratio: 27 % (ref 24–39)
T4 TOTAL: 10.2 ug/dL (ref 4.5–12.0)
TSH: 3.67 u[IU]/mL (ref 0.450–4.500)

## 2015-07-06 LAB — NMR, LIPOPROFILE
Cholesterol: 166 mg/dL (ref 100–199)
HDL CHOLESTEROL BY NMR: 37 mg/dL — AB (ref >39)
HDL Particle Number: 25.1 umol/L — ABNORMAL LOW (ref >=30.5)
LDL Particle Number: 1781 nmol/L — ABNORMAL HIGH (ref <1000)
LDL SIZE: 20.3 nm (ref >20.5)
LDL-C: 116 mg/dL — ABNORMAL HIGH (ref 0–99)
LP-IR SCORE: 46 — AB (ref <=45)
SMALL LDL PARTICLE NUMBER: 1050 nmol/L — AB (ref <=527)
Triglycerides by NMR: 63 mg/dL (ref 0–149)

## 2015-07-06 LAB — VITAMIN D 25 HYDROXY (VIT D DEFICIENCY, FRACTURES): VIT D 25 HYDROXY: 49.9 ng/mL (ref 30.0–100.0)

## 2015-07-14 ENCOUNTER — Other Ambulatory Visit: Payer: PPO

## 2015-07-14 DIAGNOSIS — Z1212 Encounter for screening for malignant neoplasm of rectum: Secondary | ICD-10-CM

## 2015-07-14 NOTE — Progress Notes (Signed)
Lab only 

## 2015-07-16 LAB — FECAL OCCULT BLOOD, IMMUNOCHEMICAL: Fecal Occult Bld: NEGATIVE

## 2015-08-04 ENCOUNTER — Other Ambulatory Visit: Payer: Self-pay | Admitting: *Deleted

## 2015-08-04 MED ORDER — METFORMIN HCL 500 MG PO TABS: ORAL_TABLET | ORAL | Status: DC

## 2015-08-09 ENCOUNTER — Telehealth: Payer: Self-pay | Admitting: Neurology

## 2015-08-09 NOTE — Telephone Encounter (Signed)
I have spoken with Jacob Fowler this morning and advised that I spoke with Jacob Fowler at [facility] and have been told that PA for Betaseron is not needed until April 2017.  He verbalized understanding of same/fim

## 2015-08-09 NOTE — Telephone Encounter (Signed)
Patient called to advise [facility] is requiring Prior Auth for [place], call [phone].

## 2015-08-09 NOTE — Telephone Encounter (Signed)
Patient also advised, they have given him a temporary 30 day supply.

## 2015-08-12 ENCOUNTER — Encounter: Payer: Self-pay | Admitting: *Deleted

## 2015-08-17 ENCOUNTER — Encounter: Payer: Self-pay | Admitting: *Deleted

## 2015-08-18 ENCOUNTER — Encounter: Payer: Self-pay | Admitting: *Deleted

## 2015-08-18 ENCOUNTER — Other Ambulatory Visit: Payer: Self-pay | Admitting: *Deleted

## 2015-08-18 MED ORDER — PIOGLITAZONE HCL 30 MG PO TABS: 30.0000 mg | ORAL_TABLET | Freq: Every day | ORAL | Status: DC

## 2015-08-19 ENCOUNTER — Ambulatory Visit (INDEPENDENT_AMBULATORY_CARE_PROVIDER_SITE_OTHER): Payer: PPO | Admitting: Neurology

## 2015-08-19 ENCOUNTER — Encounter: Payer: Self-pay | Admitting: *Deleted

## 2015-08-19 ENCOUNTER — Encounter: Payer: Self-pay | Admitting: Neurology

## 2015-08-19 VITALS — BP 140/56 | HR 68 | Resp 18 | Ht 66.0 in | Wt 156.6 lb

## 2015-08-19 DIAGNOSIS — R5383 Other fatigue: Secondary | ICD-10-CM

## 2015-08-19 DIAGNOSIS — R261 Paralytic gait: Secondary | ICD-10-CM | POA: Diagnosis not present

## 2015-08-19 DIAGNOSIS — M21371 Foot drop, right foot: Secondary | ICD-10-CM | POA: Diagnosis not present

## 2015-08-19 DIAGNOSIS — G4700 Insomnia, unspecified: Secondary | ICD-10-CM | POA: Diagnosis not present

## 2015-08-19 DIAGNOSIS — G35 Multiple sclerosis: Secondary | ICD-10-CM | POA: Diagnosis not present

## 2015-08-19 DIAGNOSIS — R39198 Other difficulties with micturition: Secondary | ICD-10-CM | POA: Diagnosis not present

## 2015-08-19 MED ORDER — AMANTADINE HCL 100 MG PO CAPS: 100.0000 mg | ORAL_CAPSULE | Freq: Two times a day (BID) | ORAL | Status: DC

## 2015-08-19 NOTE — Progress Notes (Signed)
[facility]  PATIENT: Jacob Fowler DOB: August 29, 1950  REFERRING DOCTOR OR PCP:  Redge Gainer SOURCE: patient  _________________________________   HISTORICAL  CHIEF COMPLAINT:  Chief Complaint  Patient presents with  . Multiple Sclerosis    Sts. he still has some slow healing inj. sites (Betaseron.)  Had on on his left hip that became infected--pcp is treating this and pt. feels it is healing well now.  Sts. bladder function is improved with Flomax, Oxybutynin./fim    HISTORY OF PRESENT ILLNESS:  Jacob Fowler is a 65 year old man with multiple sclerosis.     Currently, he is on Betaseron. He tolerates it well and he has not had any definite exacerbations recently.  Gait/strength/sensation: He has poor gait due to right >> left leg weakness and spasticity.   The  right AFO helps his foot drop some.   He is falling about once a month, usually inside the house.   He loses balance and falls backwards.   He uses his cane but does bot always have it with him when he falls.  He denies any numbness in the legs. 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   He denies any significant numbness, weakness or clumsiness in the arms.   He denies painful  spasms.     He tried Ampyra in the past but it has not helped.     Bladder: He has urinary frequency,  urgency and urinary hesitancy. Tamsulosin with ditropan has helped the urinary symptoms.   He urinates q 2-3 hours during the day and once at night.    Vision: He denies any MS related visual problems. He wears glasses for correction.  Fatigue/sleep: He feels fatigued every day, worse in the afternoons.  He feels physically tired more than mental. .   He sually starts every day feeling fairly energize.     He feels that the fatigue has worsened over the last year. He feels he is sleeping well most nights.   He does not snore much and his wife has not noted any apnea.   Mood/cognition:   He  denies any depression or anxiety. His wife had noted he was sometimes very quiet but he is never tearful and is not irritable. He feels his short-term memory is a little worse in the used to but he notes no other cognitive issue.   MS History:   He began to note significant difficulty with gait in January 2010.      He had some back x-rays. He was then referred to neurosurgery that noted that he had plaques in his spine c/w  MS. He had further testing including MRI of the brain and spinal tap. These were all consistent with multiple sclerosis and by November 2010 he had started Betaseron. He feels that he has been mostly stable over the past 5 years but has noted that his gait is a little worse than it was in 2010. About 2 or 3 years ago, he started using a cane.  NEW DATA SINCE LAST VISIT: MRI Images 09/30/2014 were personally reviewed and compared.  I concur with the interpretations: Abnormal MRI cervical spine (with and without) demonstrating: 1. Chronic demyelinating spinal cord plaques at C4-5 on the left and T1 on the right. 2. No acute plaques. 3. No significant change from MRI on 09/22/10.  Abnormal MRI brain (with and without) demonstrating: 1. Multiple supratentorial chronic demyelinating plaques. Some of these are hypointense on  T1 views. 2. No acute plaques. 3. No change from MRI on 09/22/10.    REVIEW OF SYSTEMS: Constitutional: No fevers, chills, sweats, or change in appetite.  He notes fatigue  Eyes: No visual changes, double vision, eye pain Ear, nose and throat: No hearing loss, ear pain, nasal congestion, sore throat Cardiovascular: No chest pain, palpitations Respiratory: No shortness of breath at rest or with exertion.   No wheezes GastrointestinaI: No nausea, vomiting, diarrhea, abdominal pain, fecal incontinence Genitourinary: urinary frequency and hesitancy  .  No nocturia now Musculoskeletal: No neck pain, back pain Integumentary: No rash, pruritus, skin  lesions Neurological: as above Psychiatric: No depression at this time.  No anxiety Endocrine: No palpitations, diaphoresis, change in appetite, change in weigh or increased thirst Hematologic/Lymphatic: No anemia, purpura, petechiae. Allergic/Immunologic: No itchy/runny eyes, nasal congestion, recent allergic reactions, rashes  ALLERGIES: No Known Allergies  HOME MEDICATIONS:  Current outpatient prescriptions:  .  amLODipine (NORVASC) 2.5 MG tablet, TAKE ONE TABLET BY MOUTH EVERY MORNING, Disp: 90 tablet, Rfl: 1 .  aspirin (LONGS ADULT LOW STRENGTH ASA) 81 MG EC tablet, Take 81 mg by mouth daily. , Disp: , Rfl:  .  cholecalciferol (VITAMIN D) 1000 UNITS tablet, Take 1,000 Units by mouth daily., Disp: , Rfl:  .  cyanocobalamin (,VITAMIN B-12,) 1000 MCG/ML injection, Inject 1 mL (1,000 mcg total) into the skin once., Disp: 3 mL, Rfl: 3 .  ezetimibe (ZETIA) 10 MG tablet, Take 1 tablet (10 mg total) by mouth daily., Disp: 30 tablet, Rfl: 3 .  fish oil-omega-3 fatty acids 1000 MG capsule, Take 2 g by mouth 2 (two) times daily., Disp: , Rfl:  .  fluticasone (FLONASE) 50 MCG/ACT nasal spray, One to 2 sprays each nostril at bedtime, Disp: 16 g, Rfl: 6 .  glucose blood test strip, Use as instructed, Disp: 100 each, Rfl: 12 .  Interferon Beta-1b (BETASERON) 0.3 MG KIT injection, Inject 0.25 mg into the skin every other day., Disp: 1 kit, Rfl: 6 .  isosorbide mononitrate (IMDUR) 60 MG 24 hr tablet, TAKE ONE-HALF (1/2) TABLET (30MG) BY MOUTH EVERY DAY, Disp: 15 tablet, Rfl: 1 .  levothyroxine (LEVOTHROID) 25 MCG tablet, Take 1 tablet (25 mcg total) by mouth daily before breakfast., Disp: 90 tablet, Rfl: 2 .  lisinopril (PRINIVIL,ZESTRIL) 10 MG tablet, Take 1 tablet (10 mg total) by mouth daily., Disp: 30 tablet, Rfl: 4 .  metFORMIN (GLUCOPHAGE) 500 MG tablet, TAKE TWO TABLETS (1000MG) BY MOUTH TWICEDAILY WITH MEALS, Disp: 120 tablet, Rfl: 4 .  mupirocin ointment (BACTROBAN) 2 %, Apply 1 application  topically 2 (two) times daily., Disp: 22 g, Rfl: 1 .  omeprazole (PRILOSEC) 20 MG capsule, Take 20 mg by mouth daily., Disp: , Rfl:  .  oxybutynin (DITROPAN) 5 MG tablet, Take 1 tablet (5 mg total) by mouth 3 (three) times daily., Disp: 60 tablet, Rfl: 11 .  pioglitazone (ACTOS) 30 MG tablet, Take 1 tablet (30 mg total) by mouth daily., Disp: 30 tablet, Rfl: 4 .  rosuvastatin (CRESTOR) 20 MG tablet, Take 1 tablet (20 mg total) by mouth daily. As directed, Disp: 90 tablet, Rfl: 1 .  Tamsulosin HCl (FLOMAX) 0.4 MG CAPS, Take 0.4 mg by mouth daily after supper. , Disp: , Rfl:  .  Tuberculin-Allergy Syringes (ALLERGY SYRINGE 1CC/27GX1/2") 27G X 1/2" 1 ML MISC, Injection once monthly- B 12 shots, Disp: 100 each, Rfl: 1 .  [DISCONTINUED] atorvastatin (LIPITOR) 10 MG tablet, Take 10 mg by mouth daily.  , Disp: ,  Rfl:  .  [DISCONTINUED] niacin-simvastatin (SIMCOR) 500-20 MG 24 hr tablet, Take 1 tablet by mouth at bedtime.  , Disp: , Rfl:   Current facility-administered medications:  .  cyanocobalamin ((VITAMIN B-12)) injection 1,000 mcg, 1,000 mcg, Intramuscular, Q30 days, Chipper Herb, MD, 1,000 mcg at 03/02/15 1647  PAST MEDICAL HISTORY: Past Medical History  Diagnosis Date  . NIDDM (non-insulin dependent diabetes mellitus)   . BPH associated with nocturia   . Hyperlipidemia   . Hypertension   . Acquired leg length discrepancy   . Hx of adenomatous colonic polyps   . Rosacea   . GERD (gastroesophageal reflux disease)   . Multiple sclerosis ([place]) DX  10/10---  NEUROLOGIST  DR Delfino Lovett [facility] ([place])    RIGHT SIDE AFFECTED MORE W/ WEAKNESS- - USES CANE  . Peyronie's disease   . History of nephrolithiasis 2009  . H/O hiatal hernia   . Right ureteral stone   . Bladder stone   . CAD (coronary artery disease) CARDIOLOGIST- DR HOCHREIN--  LAST VISIT 09-27-2011 IN EPIC    Non obstructive Cath 2010-  HX CORONARY SPASM 2004  . Renal stone RIGHT  . RBBB   . Neuromuscular disorder ([place])     MS   . Movement disorder   . Vision abnormalities     PAST SURGICAL HISTORY: Past Surgical History  Procedure Laterality Date  . Extracorporeal shock wave lithotripsy  09-05-2010    RIGHT  . Nesbit procedure  10-17-2004    CORRECTION OF PENILE ANGULATION (PEYRONIES DISEASE)  . Cardiac catheterization  02-26-2009 ---  DR Ida Rogue    NONOBSTRUCTIVE CAD/ 50% DISTAL RCA/ 30% LAD / NORMAL LVF  . Cystoscopy with litholapaxy  10/30/2011    Procedure: CYSTOSCOPY WITH LITHOLAPAXY;  Surgeon: Claybon Jabs, MD;  Location: [facility];  Service: Urology;  Laterality: N/A;  . Cystoscopy w/ retrogrades  10/30/2011    Procedure: CYSTOSCOPY WITH RETROGRADE PYELOGRAM;  Surgeon: Claybon Jabs, MD;  [facility];  Service: Urology;  Laterality: Right;    FAMILY HISTORY: Family History  Problem Relation Age of Onset  . Stroke Mother   . Heart attack Mother   . Renal cancer Father     SOCIAL HISTORY:  Social History   Social History  . Marital Status: Married    Spouse Name: N/A  . Number of Children: N/A  . Years of Education: N/A   Occupational History  . Not on file.   Social History Main Topics  . Smoking status: Former Smoker -- 1.00 packs/day for 7 years    Types: Cigarettes    Quit date: 09/26/1973  . Smokeless tobacco: Current User    Types: Chew     Comment: CHEW TOBACCO FOR 35 YRS  . Alcohol Use: No  . Drug Use: No  . Sexual Activity: Not on file   Other Topics Concern  . Not on file   Social History Narrative     PHYSICAL EXAM  Filed Vitals:   08/19/15 0852  BP: 140/56  Pulse: 68  Resp: 18  Height: 5' 6"  (1.676 m)  Weight: 156 lb 9.6 oz (71.033 kg)    Body mass index is 25.29 kg/(m^2).   General: The patient is well-developed and well-nourished and in no acute distress  Neurologic Exam  Mental status: The patient is alert and oriented x 3 at the time of the examination. The patient has apparent normal recent  and remote memory, with an  apparently normal attention span and concentration ability.   Speech is normal.  Cranial nerves: Extraocular movements are full.   There is good facial sensation to soft touch bilaterally.Facial strength is normal.  Trapezius and sternocleidomastoid strength is normal. No dysarthria is noted.  The tongue is midline, and the patient has symmetric elevation of the soft palate. No obvious hearing deficits are noted.  Motor:  Muscle bulk is normal.   Tone is increased in legs, right greater than left. Strength is 2 / 5 hip flexors and 3 quads and 4-/5 distally in the right leg and 4/5 in the left leg. Strength is  5 / 5 in arms.     Sensory: Sensory testing is intact to touch and vibration sensation in all 4 extremities.  Coordination: Cerebellar testing reveals good finger-nose-finger .    Gait and station: Station is normal.   Gait is spastic and has a right foot drop but both legs seem weak. The knee hyperextends a little bit on the right as he walks. He is unable to tandem gait.   Reflexes: Deep tendon reflexes are increased in his legs with spread at the right knee. He has 2 beats of nonsustained clonus on the right ankle.Marland Kitchen      DIAGNOSTIC DATA (LABS, IMAGING, TESTING) - I reviewed patient records, labs, notes, testing and imaging myself where available.  Lab Results  Component Value Date   WBC 6.3 07/05/2015   HGB 11.3* 02/19/2015   HCT 33.9* 07/05/2015   MCV 82 07/05/2015   PLT 194 07/05/2015      Component Value Date/Time   NA 142 07/05/2015 1120   NA 138 01/06/2013 1311   K 4.0 07/05/2015 1120   CL 103 07/05/2015 1120   CO2 24 07/05/2015 1120   GLUCOSE 129* 07/05/2015 1120   GLUCOSE 130* 01/06/2013 1311   BUN 21 07/05/2015 1120   BUN 21 01/06/2013 1311   CREATININE 1.00 07/05/2015 1120   CREATININE 1.13 01/06/2013 1311   CALCIUM 9.7 07/05/2015 1120   PROT 6.8 07/05/2015 1120   PROT 6.9 01/06/2013 1311   ALBUMIN 4.3 07/05/2015 1120   ALBUMIN  4.0 01/06/2013 1311   AST 19 07/05/2015 1120   ALT 17 07/05/2015 1120   ALKPHOS 63 07/05/2015 1120   BILITOT 0.3 07/05/2015 1120   BILITOT 0.3 05/27/2014 1023   GFRNONAA 79 07/05/2015 1120   GFRNONAA 69 01/06/2013 1311   GFRAA 91 07/05/2015 1120   GFRAA 80 01/06/2013 1311   Lab Results  Component Value Date   CHOL 166 07/05/2015   HDL 37* 07/05/2015   LDLCALC 77 01/28/2014   TRIG 63 07/05/2015   CHOLHDL 6.8* 05/14/2013   Lab Results  Component Value Date   HGBA1C 6.9 07/05/2015        ASSESSMENT AND PLAN  Multiple sclerosis ([facility])  Spastic gait  Right foot drop  Other fatigue  Insomnia  Urinary dysfunction   1.   Continue Betaseron. 2.  Continue tamsulosin with oxybutynin for bladder 3.   He is active.    Recommend walker for safety.  He prefers can 4.   Amantadine for fatigue He will return to see me in 6 months or sooner if he has new or worsening neurologic symptoms.   Haileyann Staiger A. Felecia Shelling, MD, PhD 10/19/3644, 8:03 AM Certified in Neurology, Clinical Neurophysiology, Sleep Medicine, Pain Medicine and Neuroimaging  [facility] [address] [place], [place] [address] [phone]

## 2015-09-01 ENCOUNTER — Other Ambulatory Visit: Payer: Self-pay

## 2015-09-01 MED ORDER — ISOSORBIDE MONONITRATE ER 60 MG PO TB24: ORAL_TABLET | ORAL | Status: DC

## 2015-09-07 ENCOUNTER — Other Ambulatory Visit: Payer: Self-pay

## 2015-09-07 MED ORDER — AMLODIPINE BESYLATE 2.5 MG PO TABS
2.5000 mg | ORAL_TABLET | Freq: Every morning | ORAL | Status: DC
Start: 2015-09-07 — End: 2016-04-05

## 2015-09-07 MED ORDER — LISINOPRIL 10 MG PO TABS: 10.0000 mg | ORAL_TABLET | Freq: Every day | ORAL | Status: DC

## 2015-09-30 ENCOUNTER — Other Ambulatory Visit: Payer: Self-pay | Admitting: *Deleted

## 2015-11-23 ENCOUNTER — Encounter: Payer: Self-pay | Admitting: Family Medicine

## 2015-11-23 ENCOUNTER — Encounter (INDEPENDENT_AMBULATORY_CARE_PROVIDER_SITE_OTHER): Payer: Self-pay

## 2015-11-23 ENCOUNTER — Ambulatory Visit (INDEPENDENT_AMBULATORY_CARE_PROVIDER_SITE_OTHER): Payer: PPO | Admitting: Family Medicine

## 2015-11-23 VITALS — BP 127/71 | HR 72 | Temp 97.3°F | Ht 66.0 in | Wt 153.0 lb

## 2015-11-23 DIAGNOSIS — E039 Hypothyroidism, unspecified: Secondary | ICD-10-CM

## 2015-11-23 DIAGNOSIS — G35D Multiple sclerosis, unspecified: Secondary | ICD-10-CM

## 2015-11-23 DIAGNOSIS — L719 Rosacea, unspecified: Secondary | ICD-10-CM

## 2015-11-23 DIAGNOSIS — I10 Essential (primary) hypertension: Secondary | ICD-10-CM

## 2015-11-23 DIAGNOSIS — E119 Type 2 diabetes mellitus without complications: Secondary | ICD-10-CM

## 2015-11-23 DIAGNOSIS — I2510 Atherosclerotic heart disease of native coronary artery without angina pectoris: Secondary | ICD-10-CM

## 2015-11-23 DIAGNOSIS — E785 Hyperlipidemia, unspecified: Secondary | ICD-10-CM | POA: Diagnosis not present

## 2015-11-23 DIAGNOSIS — E1169 Type 2 diabetes mellitus with other specified complication: Secondary | ICD-10-CM

## 2015-11-23 DIAGNOSIS — D519 Vitamin B12 deficiency anemia, unspecified: Secondary | ICD-10-CM | POA: Diagnosis not present

## 2015-11-23 DIAGNOSIS — E559 Vitamin D deficiency, unspecified: Secondary | ICD-10-CM | POA: Diagnosis not present

## 2015-11-23 DIAGNOSIS — M21371 Foot drop, right foot: Secondary | ICD-10-CM

## 2015-11-23 DIAGNOSIS — G35 Multiple sclerosis: Secondary | ICD-10-CM

## 2015-11-23 DIAGNOSIS — N400 Enlarged prostate without lower urinary tract symptoms: Secondary | ICD-10-CM | POA: Diagnosis not present

## 2015-11-23 LAB — BAYER DCA HB A1C WAIVED: HB A1C: 6.5 % (ref <7.0)

## 2015-11-23 MED ORDER — ROSUVASTATIN CALCIUM 20 MG PO TABS
20.0000 mg | ORAL_TABLET | Freq: Every day | ORAL | Status: DC
Start: 2015-11-23 — End: 2016-11-14

## 2015-11-23 MED ORDER — DOXYCYCLINE HYCLATE 100 MG PO TABS: 100.0000 mg | ORAL_TABLET | Freq: Every day | ORAL | Status: DC

## 2015-11-23 MED ORDER — ROSUVASTATIN CALCIUM 20 MG PO TABS: 20.0000 mg | ORAL_TABLET | Freq: Every day | ORAL | Status: DC

## 2015-11-23 NOTE — Progress Notes (Signed)
Subjective:    [name], male    DOB: 12-18-1950, 65 y.o.   MRN: [id number]  HPI Pt here for follow up and management of chronic medical problems which includes diabetes, hyperlipidemia, and hypothyroid. He is taking medications regularly.The biggest complaint for the patient today is fatigue and the evenings. He is requesting a refill on his doxycycline and Crestor. He continues to see the neurologist in [facility] and he has not followed up yet with the cardiologist or the urologist. We went ahead and made appointments for him to see the urologist in [facility] and the cardiologist and the [facility] office so that he had does now have appointments to see these. The patient denies any chest pain or shortness of breath. He does have occasional fluttering sensations that last a few seconds not related to activity. He has no trouble swallowing his food or with heartburn indigestion nausea vomiting diarrhea or blood in the stool. He is passing his water without problems. He has a history of an elevated PSA that improved according to the patient but he is not This appointment because he had not heard from the urologist about the return appointment. We went ahead and schedule this today. He will get lab work including a PSA today. He gets his eye exams regularly and that is coming up this fall.     Patient Active Problem List   Diagnosis Date Noted  . Urinary dysfunction 08/19/2015  . Right foot drop 02/17/2015  . Urinary disorder 02/17/2015  . Insomnia 02/17/2015  . Other fatigue 02/17/2015  . 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 Bladder disorder 09/16/2014  . Multiple sclerosis ([place]) 01/06/2013  . Ureteral calculus, right 10/30/2011  . Bladder calculus 10/30/2011  . Tobacco use 09/27/2011  . CORONARY ATHEROSCLEROSIS, NATIVE VESSEL 10/06/2009  . Diabetes mellitus type 2 in nonobese ([place]) 10/01/2009  . Hyperlipidemia associated with type 2 diabetes mellitus ([place]) 10/01/2009  . Essential  hypertension 10/01/2009   Outpatient Encounter Prescriptions as of 11/23/2015  Medication Sig  . amantadine (SYMMETREL) 100 MG capsule Take 1 capsule (100 mg total) by mouth 2 (two) times daily.  Marland Kitchen amLODipine (NORVASC) 2.5 MG tablet Take 1 tablet (2.5 mg total) by mouth every morning.  Marland Kitchen aspirin (LONGS ADULT LOW STRENGTH ASA) 81 MG EC tablet Take 81 mg by mouth daily.   . cholecalciferol (VITAMIN D) 1000 UNITS tablet Take 1,000 Units by mouth daily.  . cyanocobalamin (,VITAMIN B-12,) 1000 MCG/ML injection Inject 1 mL (1,000 mcg total) into the skin once.  . fish oil-omega-3 fatty acids 1000 MG capsule Take 2 g by mouth 2 (two) times daily.  . fluticasone (FLONASE) 50 MCG/ACT nasal spray One to 2 sprays each nostril at bedtime  . glucose blood test strip Use as instructed  . Interferon Beta-1b (BETASERON) 0.3 MG KIT injection Inject 0.25 mg into the skin every other day.  . isosorbide mononitrate (IMDUR) 60 MG 24 hr tablet TAKE ONE-HALF (1/2) TABLET (30MG) BY MOUTH EVERY DAY  . levothyroxine (LEVOTHROID) 25 MCG tablet Take 1 tablet (25 mcg total) by mouth daily before breakfast.  . lisinopril (PRINIVIL,ZESTRIL) 10 MG tablet Take 1 tablet (10 mg total) by mouth daily.  . metFORMIN (GLUCOPHAGE) 500 MG tablet TAKE TWO TABLETS (1000MG) BY MOUTH TWICEDAILY WITH MEALS  . mupirocin ointment (BACTROBAN) 2 % Apply 1 application topically 2 (two) times daily.  Marland Kitchen omeprazole (PRILOSEC) 20 MG capsule Take 20 mg by mouth daily.  Marland Kitchen oxybutynin (DITROPAN) 5 MG tablet  Take 1 tablet (5 mg total) by mouth 3 (three) times daily.  . pioglitazone (ACTOS) 30 MG tablet Take 1 tablet (30 mg total) by mouth daily.  . Tamsulosin HCl (FLOMAX) 0.4 MG CAPS Take 0.4 mg by mouth daily after supper.   . Tuberculin-Allergy Syringes (ALLERGY SYRINGE 1CC/27GX1/2") 27G X 1/2" 1 ML MISC Injection once monthly- B 12 shots  . ezetimibe (ZETIA) 10 MG tablet Take 1 tablet (10 mg total) by mouth daily. (Patient not taking: Reported on  11/23/2015)  . rosuvastatin (CRESTOR) 20 MG tablet Take 1 tablet (20 mg total) by mouth daily. As directed (Patient not taking: Reported on 11/23/2015)   Facility-Administered Encounter Medications as of 11/23/2015  Medication  . cyanocobalamin ((VITAMIN B-12)) injection 1,000 mcg     Review of Systems  Constitutional: Positive for fatigue (worse in the evenings).  HENT: Negative.   Eyes: Negative.   Respiratory: Negative.   Cardiovascular: Negative.   Gastrointestinal: Negative.   Endocrine: Negative.   Genitourinary: Negative.   Musculoskeletal: Negative.   Skin: Negative.   Allergic/Immunologic: Negative.   Neurological: Negative.   Hematological: Negative.   Psychiatric/Behavioral: Negative.        Objective:   Physical Exam  Constitutional: He is oriented to person, place, and time. He appears well-developed and well-nourished.  HENT:  Head: Normocephalic and atraumatic.  Right Ear: External ear normal.  Left Ear: External ear normal.  Nose: Nose normal.  Mouth/Throat: Oropharynx is clear and moist. No oropharyngeal exudate.  Eyes: Conjunctivae and EOM are normal. Pupils are equal, round, and reactive to light. Right eye exhibits no discharge. Left eye exhibits no discharge. No scleral icterus.  Neck: Normal range of motion. Neck supple. No thyromegaly present.  Cardiovascular: Normal rate, regular rhythm, normal heart sounds and intact distal pulses.   No murmur heard. The heart is regular at 72/m  Pulmonary/Chest: Effort normal and breath sounds normal. No respiratory distress. He has no wheezes. He has no rales. He exhibits no tenderness.  Abdominal: Soft. Bowel sounds are normal. He exhibits no mass. There is no tenderness. There is no rebound and no guarding.  No inguinal adenopathy.  Genitourinary:  Urology appointment in June.  Musculoskeletal: He exhibits no edema or tenderness.  The patient uses a cane and has a foot drop with the brace on the right secondary to  his MS.  Lymphadenopathy:    He has no cervical adenopathy.  Neurological: He is alert and oriented to person, place, and time. No cranial nerve deficit.  Lower extremity reflexes are somewhat hyperactive in nature.  Skin: Skin is warm and dry. Rash noted.  Facial rash with rosacea  Psychiatric: He has a normal mood and affect. His behavior is normal. Judgment and thought content normal.  Nursing note and vitals reviewed.  BP 127/71 mmHg  Pulse 72  Temp(Src) 97.3 F (36.3 C) (Oral)  Ht _0  (1.676 m)  Wt 153 lb (69.4 kg)  BMI 24.71 kg/m2        Assessment & Plan:  1. Vitamin D deficiency -Continue current treatment pending results of lab work - CBC with Differential/Platelet - VITAMIN D 25 Hydroxy (Vit-D Deficiency, Fractures)  2. Hyperlipidemia associated with type 2 diabetes mellitus ([place]) -Continue with Crestor and with as aggressive therapeutic lifestyle changes as possible - CBC with Differential/Platelet - NMR, lipoprofile  3. Hypothyroidism, unspecified hypothyroidism type -Continue current thyroid treatment pending results of lab work - CBC with Differential/Platelet - Thyroid Panel With TSH  4. Diabetes mellitus  type 2 in nonobese ([facility]) -Continue current blood sugar treatment pending results of lab work - [place] Hb A1c Waived - CBC with Differential/Platelet  5. Atherosclerosis of native coronary artery of native heart without angina pectoris -Follow-up with cardiology and appointment was arranged for July - CBC with Differential/Platelet  6. Essential hypertension -Blood pressure is good today and he will continue with current treatment - BMP8+EGFR - CBC with Differential/Platelet - Hepatic function panel  7. B12 deficiency anemia -We will make sure that we check a B12 level today. Because of the fatigue - CBC with Differential/Platelet  8. Multiple sclerosis ([place]) -Continue follow-up with neurology  9. Right foot drop -Continue with foot  brace and being careful not to fall  10. BPH (benign prostatic hyperplasia) -Follow-up with urology - PSA, total and free  11. Rosacea -Restart doxycycline and take regularly  Meds ordered this encounter  Medications  . doxycycline (VIBRA-TABS) 100 MG tablet    Sig: Take 1 tablet (100 mg total) by mouth daily.    Dispense:  30 tablet    Refill:  3  . rosuvastatin (CRESTOR) 20 MG tablet    Sig: Take 1 tablet (20 mg total) by mouth daily.    Dispense:  30 tablet    Refill:  6  . rosuvastatin (CRESTOR) 20 MG tablet    Sig: Take 1 tablet (20 mg total) by mouth daily. As directed    Dispense:  90 tablet    Refill:  1   Patient Instructions                       Medicare Annual Wellness Visit  [facility] and the medical providers at [facility] strive to bring you the best medical care.  In doing so we not only want to address your current medical conditions and concerns but also to detect new conditions early and prevent illness, disease and health-related problems.    Medicare offers a yearly Wellness Visit which allows our clinical staff to assess your need for preventative services including immunizations, lifestyle education, counseling to decrease risk of preventable diseases and screening for fall risk and other medical concerns.    This visit is provided free of charge (no copay) for all Medicare recipients. The clinical pharmacists at [facility] have begun to conduct these Wellness Visits which will also include a thorough review of all your medications.    As you primary medical provider recommend that you make an appointment for your Annual Wellness Visit if you have not done so already this year.  You may set up this appointment before you leave today or you may call back [phone]) and schedule an appointment.  Please make sure when you call that you mention that you are scheduling your Annual Wellness Visit with the clinical  pharmacist so that the appointment may be made for the proper length of time.     Continue current medications. Continue good therapeutic lifestyle changes which include good diet and exercise. Fall precautions discussed with patient. If an FOBT was given today- please return it to our front desk. If you are over 53 years old - you may need Prevnar 13 or the adult Pneumonia vaccine.  **Flu shots are available--- please call and schedule a FLU-CLINIC appointment**  After your visit with [place] today you will receive a survey in the mail or online from [employer] regarding your care with [place]. Please take a moment to  fill this out. Your feedback is very important to [place] as you can help [place] better understand your patient needs as well as improve your experience and satisfaction. WE CARE ABOUT YOU!!!   Stay as active as possible but continue to be careful not to fall We will call with lab work once the results become available and we will make sure that your neurologist gets a copy of this report Please keep appointment with urology and with cardiology as these have been set up for you   Arrie Senate MD

## 2015-11-23 NOTE — Patient Instructions (Addendum)
Medicare Annual Wellness Visit  [facility] and the medical providers at [facility] strive to bring you the best medical care.  In doing so we not only want to address your current medical conditions and concerns but also to detect new conditions early and prevent illness, disease and health-related problems.    Medicare offers a yearly Wellness Visit which allows our clinical staff to assess your need for preventative services including immunizations, lifestyle education, counseling to decrease risk of preventable diseases and screening for fall risk and other medical concerns.    This visit is provided free of charge (no copay) for all Medicare recipients. The clinical pharmacists at [facility] have begun to conduct these Wellness Visits which will also include a thorough review of all your medications.    As you primary medical provider recommend that you make an appointment for your Annual Wellness Visit if you have not done so already this year.  You may set up this appointment before you leave today or you may call back [phone] and schedule an appointment.  Please make sure when you call that you mention that you are scheduling your Annual Wellness Visit with the clinical pharmacist so that the appointment may be made for the proper length of time.     Continue current medications. Continue good therapeutic lifestyle changes which include good diet and exercise. Fall precautions discussed with patient. If an FOBT was given today- please return it to our front desk. If you are over 35 years old - you may need Prevnar 20 or the adult Pneumonia vaccine.  **Flu shots are available--- please call and schedule a FLU-CLINIC appointment**  After your visit with [place] today you will receive a survey in the mail or online from [employer] regarding your care with [place]. Please take a moment to fill this out. Your feedback is very  important to [place] as you can help [place] better understand your patient needs as well as improve your experience and satisfaction. WE CARE ABOUT YOU!!!   Stay as active as possible but continue to be careful not to fall We will call with lab work once the results become available and we will make sure that your neurologist gets a copy of this report Please keep appointment with urology and with cardiology as these have been set up for you

## 2015-11-23 NOTE — Addendum Note (Signed)
Addended by: Zannie Cove on: 11/23/2015 11:20 AM   Modules accepted: Orders

## 2015-11-24 LAB — CBC WITH DIFFERENTIAL/PLATELET
BASOS ABS: 0 10*3/uL (ref 0.0–0.2)
Basos: 0 %
EOS (ABSOLUTE): 0 10*3/uL (ref 0.0–0.4)
EOS: 1 %
HEMATOCRIT: 35.3 % — AB (ref 37.5–51.0)
Hemoglobin: 12.2 g/dL — ABNORMAL LOW (ref 12.6–17.7)
Immature Grans (Abs): 0 10*3/uL (ref 0.0–0.1)
Immature Granulocytes: 0 %
LYMPHS ABS: 0.5 10*3/uL — AB (ref 0.7–3.1)
Lymphs: 11 %
MCH: 27.2 pg (ref 26.6–33.0)
MCHC: 34.6 g/dL (ref 31.5–35.7)
MCV: 79 fL (ref 79–97)
MONOS ABS: 0.4 10*3/uL (ref 0.1–0.9)
Monocytes: 9 %
Neutrophils Absolute: 3.8 10*3/uL (ref 1.4–7.0)
Neutrophils: 79 %
Platelets: 179 10*3/uL (ref 150–379)
RBC: 4.48 x10E6/uL (ref 4.14–5.80)
RDW: 15.2 % (ref 12.3–15.4)
WBC: 4.8 10*3/uL (ref 3.4–10.8)

## 2015-11-24 LAB — THYROID PANEL WITH TSH
FREE THYROXINE INDEX: 2.6 (ref 1.2–4.9)
T3 UPTAKE RATIO: 26 % (ref 24–39)
T4 TOTAL: 9.9 ug/dL (ref 4.5–12.0)
TSH: 4.61 u[IU]/mL — ABNORMAL HIGH (ref 0.450–4.500)

## 2015-11-24 LAB — NMR, LIPOPROFILE
Cholesterol: 196 mg/dL (ref 100–199)
HDL CHOLESTEROL BY NMR: 30 mg/dL — AB (ref >39)
HDL PARTICLE NUMBER: 19.8 umol/L — AB (ref >=30.5)
LDL Particle Number: 1682 nmol/L — ABNORMAL HIGH (ref <1000)
LDL Size: 20.5 nm (ref >20.5)
LDL-C: 148 mg/dL — ABNORMAL HIGH (ref 0–99)
LP-IR Score: 52 — ABNORMAL HIGH (ref <=45)
Small LDL Particle Number: 1030 nmol/L — ABNORMAL HIGH (ref <=527)
Triglycerides by NMR: 90 mg/dL (ref 0–149)

## 2015-11-24 LAB — VITAMIN D 25 HYDROXY (VIT D DEFICIENCY, FRACTURES): VIT D 25 HYDROXY: 47.1 ng/mL (ref 30.0–100.0)

## 2015-11-24 LAB — HEPATIC FUNCTION PANEL
ALBUMIN: 4.6 g/dL (ref 3.6–4.8)
ALT: 16 IU/L (ref 0–44)
AST: 22 IU/L (ref 0–40)
Alkaline Phosphatase: 64 IU/L (ref 39–117)
Bilirubin Total: 0.3 mg/dL (ref 0.0–1.2)
Bilirubin, Direct: 0.11 mg/dL (ref 0.00–0.40)
Total Protein: 7.3 g/dL (ref 6.0–8.5)

## 2015-11-24 LAB — BMP8+EGFR
BUN/Creatinine Ratio: 21 (ref 10–24)
BUN: 20 mg/dL (ref 8–27)
CHLORIDE: 102 mmol/L (ref 96–106)
CO2: 22 mmol/L (ref 18–29)
Calcium: 9.8 mg/dL (ref 8.6–10.2)
Creatinine, Ser: 0.97 mg/dL (ref 0.76–1.27)
GFR calc Af Amer: 94 mL/min/{1.73_m2} (ref >59)
GFR calc non Af Amer: 82 mL/min/{1.73_m2} (ref >59)
GLUCOSE: 117 mg/dL — AB (ref 65–99)
POTASSIUM: 4.1 mmol/L (ref 3.5–5.2)
SODIUM: 144 mmol/L (ref 134–144)

## 2015-11-24 LAB — PSA, TOTAL AND FREE
PSA, Free Pct: 22.3 %
PSA, Free: 1.16 ng/mL
Prostate Specific Ag, Serum: 5.2 ng/mL — ABNORMAL HIGH (ref 0.0–4.0)

## 2015-11-24 LAB — VITAMIN B12: VITAMIN B 12: 495 pg/mL (ref 211–946)

## 2015-11-30 ENCOUNTER — Telehealth: Payer: Self-pay | Admitting: *Deleted

## 2015-11-30 MED ORDER — INTERFERON BETA-1B 0.3 MG ~~LOC~~ KIT: 0.2500 mg | PACK | SUBCUTANEOUS | Status: DC

## 2015-11-30 NOTE — Telephone Encounter (Signed)
Betaseron rx. escribed to [facility] per faxed request/fim

## 2015-12-08 ENCOUNTER — Telehealth: Payer: Self-pay | Admitting: Family Medicine

## 2015-12-09 NOTE — Telephone Encounter (Signed)
Patient called stating that his Wife Hassan Rowan) needs a letter for jury duty stating that she is unable to perform jury duty due to being a full time care giver for patient

## 2015-12-10 ENCOUNTER — Encounter: Payer: Self-pay | Admitting: *Deleted

## 2015-12-21 DIAGNOSIS — R972 Elevated prostate specific antigen [PSA]: Secondary | ICD-10-CM | POA: Diagnosis not present

## 2015-12-21 DIAGNOSIS — R3915 Urgency of urination: Secondary | ICD-10-CM | POA: Diagnosis not present

## 2015-12-21 DIAGNOSIS — R350 Frequency of micturition: Secondary | ICD-10-CM | POA: Diagnosis not present

## 2015-12-21 DIAGNOSIS — N200 Calculus of kidney: Secondary | ICD-10-CM | POA: Diagnosis not present

## 2015-12-21 DIAGNOSIS — N401 Enlarged prostate with lower urinary tract symptoms: Secondary | ICD-10-CM | POA: Diagnosis not present

## 2015-12-30 ENCOUNTER — Telehealth: Payer: Self-pay | Admitting: Family Medicine

## 2015-12-30 ENCOUNTER — Encounter: Payer: Self-pay | Admitting: Family Medicine

## 2015-12-30 ENCOUNTER — Ambulatory Visit (INDEPENDENT_AMBULATORY_CARE_PROVIDER_SITE_OTHER): Payer: PPO | Admitting: Family Medicine

## 2015-12-30 VITALS — BP 95/55 | HR 71 | Temp 97.0°F | Ht 66.0 in | Wt 150.0 lb

## 2015-12-30 DIAGNOSIS — L719 Rosacea, unspecified: Secondary | ICD-10-CM

## 2015-12-30 DIAGNOSIS — R110 Nausea: Secondary | ICD-10-CM

## 2015-12-30 DIAGNOSIS — L989 Disorder of the skin and subcutaneous tissue, unspecified: Secondary | ICD-10-CM

## 2015-12-30 DIAGNOSIS — Z139 Encounter for screening, unspecified: Secondary | ICD-10-CM

## 2015-12-30 NOTE — Patient Instructions (Addendum)
HOLD doxy 100 mg over the weekend Restart medication at 50 mg every other day on Monday with food

## 2015-12-30 NOTE — Telephone Encounter (Signed)
Spoke to pt regarding nausea Per pt he is not taking any new medications Symptoms started approximately 2 wks ago appt scheduled with PCP

## 2015-12-30 NOTE — Progress Notes (Signed)
Subjective:    [name], male    DOB: February 10, 1951, 65 y.o.   MRN: [id number]  HPI Patient here today for nausea. He states that he doesn't have any vomiting, constipation or diarrhea. This has been going on for about 2-3 weeks. Says about when he started back on his doxycycline. He is only taking the doxycycline every other day night he takes it when he goes to bed at night and the majority of the nausea is in the morning time by lunchtime. He does take his thyroid medicine at night and he takes his Betaseron every other night when he takes his doxycycline. He is taking the doxycycline for the rosacea and he says that every other night seemed to control her rosacea. He may could do with the smaller dose. We discussed this. We also discussed taking the doxycycline with the evening meal and not at bedtime.     Patient Active Problem List   Diagnosis Date Noted  . Rosacea 11/23/2015  . Urinary dysfunction 08/19/2015  . Right foot drop 02/17/2015  . Urinary disorder 02/17/2015  . Insomnia 02/17/2015  . Other fatigue 02/17/2015  . 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 Bladder disorder 09/16/2014  . Multiple sclerosis ([place]) 01/06/2013  . Ureteral calculus, right 10/30/2011  . Bladder calculus 10/30/2011  . Tobacco use 09/27/2011  . CORONARY ATHEROSCLEROSIS, NATIVE VESSEL 10/06/2009  . Diabetes mellitus type 2 in nonobese ([place]) 10/01/2009  . Hyperlipidemia associated with type 2 diabetes mellitus ([place]) 10/01/2009  . Essential hypertension 10/01/2009   Outpatient Encounter Prescriptions as of 12/30/2015  Medication Sig  . amantadine (SYMMETREL) 100 MG capsule Take 1 capsule (100 mg total) by mouth 2 (two) times daily.  Marland Kitchen amLODipine (NORVASC) 2.5 MG tablet Take 1 tablet (2.5 mg total) by mouth every morning.  Marland Kitchen aspirin (LONGS ADULT LOW STRENGTH ASA) 81 MG EC tablet Take 81 mg by mouth daily.   . cholecalciferol (VITAMIN D) 1000 UNITS tablet Take 1,000 Units by mouth daily.  .  cyanocobalamin (,VITAMIN B-12,) 1000 MCG/ML injection Inject 1 mL (1,000 mcg total) into the skin once.  . doxycycline (VIBRA-TABS) 100 MG tablet Take 1 tablet (100 mg total) by mouth daily.  Marland Kitchen ezetimibe (ZETIA) 10 MG tablet Take 1 tablet (10 mg total) by mouth daily.  . fish oil-omega-3 fatty acids 1000 MG capsule Take 2 g by mouth 2 (two) times daily.  . fluticasone (FLONASE) 50 MCG/ACT nasal spray One to 2 sprays each nostril at bedtime  . glucose blood test strip Use as instructed  . Interferon Beta-1b (BETASERON) 0.3 MG KIT injection Inject 0.25 mg into the skin every other day.  . isosorbide mononitrate (IMDUR) 60 MG 24 hr tablet TAKE ONE-HALF (1/2) TABLET (30MG) BY MOUTH EVERY DAY  . levothyroxine (LEVOTHROID) 25 MCG tablet Take 1 tablet (25 mcg total) by mouth daily before breakfast.  . lisinopril (PRINIVIL,ZESTRIL) 10 MG tablet Take 1 tablet (10 mg total) by mouth daily.  . metFORMIN (GLUCOPHAGE) 500 MG tablet TAKE TWO TABLETS (1000MG) BY MOUTH TWICEDAILY WITH MEALS  . mupirocin ointment (BACTROBAN) 2 % Apply 1 application topically 2 (two) times daily.  Marland Kitchen omeprazole (PRILOSEC) 20 MG capsule Take 20 mg by mouth daily.  . pioglitazone (ACTOS) 30 MG tablet Take 1 tablet (30 mg total) by mouth daily.  . rosuvastatin (CRESTOR) 20 MG tablet Take 1 tablet (20 mg total) by mouth daily. As directed  . Tamsulosin HCl (FLOMAX) 0.4 MG CAPS Take 0.4 mg by mouth  daily after supper.   . Tuberculin-Allergy Syringes (ALLERGY SYRINGE 1CC/27GX1/2") 27G X 1/2" 1 ML MISC Injection once monthly- B 12 shots  . [DISCONTINUED] oxybutynin (DITROPAN) 5 MG tablet Take 1 tablet (5 mg total) by mouth 3 (three) times daily.  . [DISCONTINUED] rosuvastatin (CRESTOR) 20 MG tablet Take 1 tablet (20 mg total) by mouth daily.   Facility-Administered Encounter Medications as of 12/30/2015  Medication  . cyanocobalamin ((VITAMIN B-12)) injection 1,000 mcg      Review of Systems  Constitutional: Negative.   HENT:  Negative.   Eyes: Negative.   Respiratory: Negative.   Cardiovascular: Negative.   Gastrointestinal: Positive for nausea. Negative for vomiting, abdominal pain, diarrhea, constipation and blood in stool.  Endocrine: Negative.   Genitourinary: Negative.   Musculoskeletal: Negative.   Skin: Negative.   Allergic/Immunologic: Negative.   Neurological: Negative.   Hematological: Negative.   Psychiatric/Behavioral: Negative.        Objective:   Physical Exam  Constitutional: He is oriented to person, place, and time. He appears well-developed and well-nourished. No distress.  HENT:  Head: Normocephalic and atraumatic.  Eyes: Conjunctivae and EOM are normal. Pupils are equal, round, and reactive to light. Right eye exhibits no discharge. Left eye exhibits no discharge. No scleral icterus.  Neck: Normal range of motion. Neck supple. No thyromegaly present.  Cardiovascular: Normal rate, regular rhythm and normal heart sounds.   No murmur heard. Pulmonary/Chest: Effort normal and breath sounds normal. No respiratory distress. He has no wheezes. He has no rales.  Abdominal: Soft. Bowel sounds are normal. He exhibits no mass. There is no tenderness. There is no rebound and no guarding.  Musculoskeletal: He exhibits no edema.  The patient uses a cane because of his lower extremity weakness because of the multiple sclerosis.  Lymphadenopathy:    He has no cervical adenopathy.  Neurological: He is alert and oriented to person, place, and time.  Skin: Skin is warm and dry. No rash noted.  The rosacea is much improved since back on the doxycycline.  Psychiatric: He has a normal mood and affect. His behavior is normal. Judgment and thought content normal.  Nursing note and vitals reviewed.  BP 95/55 mmHg  Pulse 71  Temp(Src) 97 F (36.1 C) (Oral)  Ht 5' 6"  (1.676 m)  Wt 150 lb (68.04 kg)  BMI 24.22 kg/m2        Assessment & Plan:  1. Screening - Ambulatory referral to  Dermatology  2. Facial lesion - Ambulatory referral to Dermatology  3. Rosacea -Reduce doxycycline to 50 mg every other night and take it while eating the evening meal.  4. Nausea without vomiting -Reduce the doxycycline as noted above and take with the evening meal and hold it through the weekend -Call us back in the next 7-10 days with the results of this impact on nausea and if the nausea has diminished. -We'll also want to make sure that the lower dose of doxycycline helps control his rosacea.  Patient Instructions  HOLD doxy 100 mg over the weekend Restart medication at 50 mg every other day on Monday with food   Arrie Senate MD

## 2016-01-03 ENCOUNTER — Telehealth: Payer: Self-pay | Admitting: Family Medicine

## 2016-01-03 DIAGNOSIS — R110 Nausea: Secondary | ICD-10-CM

## 2016-01-03 DIAGNOSIS — R109 Unspecified abdominal pain: Secondary | ICD-10-CM

## 2016-01-03 NOTE — Telephone Encounter (Signed)
Please get CBC BMP and LFTs and schedule an abdominal ultrasound. Continue to hold doxycycline until we get the nausea issue settled and its potential cause.

## 2016-01-03 NOTE — Telephone Encounter (Signed)
Patient aware and orders placed.    

## 2016-01-03 NOTE — Telephone Encounter (Signed)
Patient states that he is still nauseated. He states he has not had any doxycycline since Tuesday. Please advise

## 2016-01-04 ENCOUNTER — Other Ambulatory Visit: Payer: PPO

## 2016-01-04 DIAGNOSIS — R110 Nausea: Secondary | ICD-10-CM | POA: Diagnosis not present

## 2016-01-04 DIAGNOSIS — R109 Unspecified abdominal pain: Secondary | ICD-10-CM

## 2016-01-05 ENCOUNTER — Other Ambulatory Visit: Payer: Self-pay | Admitting: *Deleted

## 2016-01-05 ENCOUNTER — Other Ambulatory Visit: Payer: Self-pay

## 2016-01-05 LAB — CBC WITH DIFFERENTIAL/PLATELET
BASOS: 0 %
Basophils Absolute: 0 10*3/uL (ref 0.0–0.2)
EOS (ABSOLUTE): 0.1 10*3/uL (ref 0.0–0.4)
EOS: 1 %
HEMATOCRIT: 38.9 % (ref 37.5–51.0)
HEMOGLOBIN: 12.6 g/dL (ref 12.6–17.7)
IMMATURE GRANS (ABS): 0 10*3/uL (ref 0.0–0.1)
IMMATURE GRANULOCYTES: 0 %
Lymphocytes Absolute: 0.4 10*3/uL — ABNORMAL LOW (ref 0.7–3.1)
Lymphs: 6 %
MCH: 27.5 pg (ref 26.6–33.0)
MCHC: 32.4 g/dL (ref 31.5–35.7)
MCV: 85 fL (ref 79–97)
MONOCYTES: 12 %
Monocytes Absolute: 0.7 10*3/uL (ref 0.1–0.9)
NEUTROS PCT: 81 %
Neutrophils Absolute: 4.7 10*3/uL (ref 1.4–7.0)
Platelets: 190 10*3/uL (ref 150–379)
RBC: 4.58 x10E6/uL (ref 4.14–5.80)
RDW: 16.5 % — ABNORMAL HIGH (ref 12.3–15.4)
WBC: 5.9 10*3/uL (ref 3.4–10.8)

## 2016-01-05 LAB — BMP8+EGFR
BUN / CREAT RATIO: 22 (ref 10–24)
BUN: 22 mg/dL (ref 8–27)
CO2: 21 mmol/L (ref 18–29)
CREATININE: 0.98 mg/dL (ref 0.76–1.27)
Calcium: 10.3 mg/dL — ABNORMAL HIGH (ref 8.6–10.2)
Chloride: 104 mmol/L (ref 96–106)
GFR calc Af Amer: 93 mL/min/{1.73_m2} (ref >59)
GFR, EST NON AFRICAN AMERICAN: 81 mL/min/{1.73_m2} (ref >59)
GLUCOSE: 133 mg/dL — AB (ref 65–99)
Potassium: 4.3 mmol/L (ref 3.5–5.2)
Sodium: 145 mmol/L — ABNORMAL HIGH (ref 134–144)

## 2016-01-05 LAB — HEPATIC FUNCTION PANEL
ALBUMIN: 4.8 g/dL (ref 3.6–4.8)
ALK PHOS: 67 IU/L (ref 39–117)
ALT: 19 IU/L (ref 0–44)
AST: 24 IU/L (ref 0–40)
BILIRUBIN TOTAL: 0.3 mg/dL (ref 0.0–1.2)
BILIRUBIN, DIRECT: 0.11 mg/dL (ref 0.00–0.40)
Total Protein: 7.7 g/dL (ref 6.0–8.5)

## 2016-01-05 MED ORDER — METFORMIN HCL 500 MG PO TABS: ORAL_TABLET | ORAL | Status: DC

## 2016-01-05 MED ORDER — ISOSORBIDE MONONITRATE ER 60 MG PO TB24: ORAL_TABLET | ORAL | Status: DC

## 2016-01-12 ENCOUNTER — Ambulatory Visit (HOSPITAL_COMMUNITY)
Admission: RE | Admit: 2016-01-12 | Discharge: 2016-01-12 | Disposition: A | Discharge status: Home / Self Care | Payer: PPO | Source: Ambulatory Visit | Attending: Family Medicine | Admitting: Family Medicine

## 2016-01-12 DIAGNOSIS — R109 Unspecified abdominal pain: Secondary | ICD-10-CM | POA: Insufficient documentation

## 2016-01-12 DIAGNOSIS — K828 Other specified diseases of gallbladder: Secondary | ICD-10-CM | POA: Insufficient documentation

## 2016-01-12 DIAGNOSIS — R110 Nausea: Secondary | ICD-10-CM | POA: Diagnosis not present

## 2016-01-17 ENCOUNTER — Telehealth: Payer: Self-pay | Admitting: Family Medicine

## 2016-01-17 DIAGNOSIS — K828 Other specified diseases of gallbladder: Secondary | ICD-10-CM

## 2016-01-17 DIAGNOSIS — R109 Unspecified abdominal pain: Secondary | ICD-10-CM

## 2016-01-17 DIAGNOSIS — R112 Nausea with vomiting, unspecified: Secondary | ICD-10-CM

## 2016-01-17 DIAGNOSIS — R935 Abnormal findings on diagnostic imaging of other abdominal regions, including retroperitoneum: Secondary | ICD-10-CM

## 2016-01-17 NOTE — Telephone Encounter (Signed)
We will go ahead and put in referral for Dr Lizbeth Bark. Spoke with pt.

## 2016-01-17 NOTE — Telephone Encounter (Signed)
lmtcb

## 2016-01-17 NOTE — Telephone Encounter (Signed)
Patient states he talked with you Thursday about his [place] and was told to call you back this morning.

## 2016-01-20 DIAGNOSIS — L570 Actinic keratosis: Secondary | ICD-10-CM | POA: Diagnosis not present

## 2016-01-20 DIAGNOSIS — L821 Other seborrheic keratosis: Secondary | ICD-10-CM | POA: Diagnosis not present

## 2016-01-20 DIAGNOSIS — Z85828 Personal history of other malignant neoplasm of skin: Secondary | ICD-10-CM | POA: Diagnosis not present

## 2016-01-20 DIAGNOSIS — D225 Melanocytic nevi of trunk: Secondary | ICD-10-CM | POA: Diagnosis not present

## 2016-01-24 ENCOUNTER — Other Ambulatory Visit: Payer: Self-pay | Admitting: Family Medicine

## 2016-02-02 ENCOUNTER — Other Ambulatory Visit: Payer: Self-pay | Admitting: Family Medicine

## 2016-02-06 NOTE — Progress Notes (Addendum)
Cardiology Office Note   Date:  02/09/2016   ID:  Jacob Fowler, Jacob Fowler 04/07/1951, MRN [id number]  PCP:  Redge Gainer, MD  Cardiologist:   Minus Breeding, MD   Chief Complaint  Patient presents with  . Coronary Artery Disease      History of Present Illness: Jacob Fowler is a 65 y.o. male who presents for  followup of his known nonobstructive disease. He had his last catheterization in 2010 with non obstructive disease. He has had no cardiovascular symptoms since then.  I last saw him in 2015.   He returns today for routine follow-up. Unfortunately he's had some progression of decreased steadiness on his feet and some increased falls related to foot drop multiple sclerosis. Just this morning he started using a walker. He also says he is passing kidney stone Hope ceased passing it. He had some slight hematuria he's had some pain and he thinks he might be having a urinary tract infection. He's not describing fevers or chills. From a cardiovascular standpoint he has occasional palpitations which are very fleeting and not symptomatic. He gets around with his decreased ambulation without any symptoms from a cardiovascular standpoint. He denies any chest pressure, neck or arm discomfort. He's had no shortness of breath, PND or orthopnea. He has no weight gain or edema.  Past Medical History:  Diagnosis Date  . Acquired leg length discrepancy   . Bladder stone   . BPH associated with nocturia   . CAD (coronary artery disease) CARDIOLOGIST- DR Wessie Shanks--  LAST VISIT 09-27-2011 IN EPIC   Non obstructive Cath 2010-  HX CORONARY SPASM 2004  . GERD (gastroesophageal reflux disease)   . H/O hiatal hernia   . History of nephrolithiasis 2009  . Hx of adenomatous colonic polyps   . Hyperlipidemia   . Hypertension   . Movement disorder   . Multiple sclerosis ([place]) DX  10/10---  NEUROLOGIST  DR Delfino Lovett [facility] ([place]   RIGHT SIDE AFFECTED MORE W/ WEAKNESS- - USES CANE  . Neuromuscular  disorder (HCC)    MS  . NIDDM (non-insulin dependent diabetes mellitus)   . Peyronie's disease   . RBBB   . Renal stone RIGHT  . Right ureteral stone   . Rosacea   . Vision abnormalities     Past Surgical History:  Procedure Laterality Date  . CARDIAC CATHETERIZATION  02-26-2009 ---  DR Ida Rogue   NONOBSTRUCTIVE CAD/ 50% DISTAL RCA/ 30% LAD / NORMAL LVF  . CYSTOSCOPY W/ RETROGRADES  10/30/2011   Procedure: CYSTOSCOPY WITH RETROGRADE PYELOGRAM;  Surgeon: Claybon Jabs, MD;  Location: [facility];  Service: Urology;  Laterality: Right;  . CYSTOSCOPY WITH LITHOLAPAXY  10/30/2011   Procedure: CYSTOSCOPY WITH LITHOLAPAXY;  Surgeon: Claybon Jabs, MD;  Location: [facility];  Service: Urology;  Laterality: N/A;  . EXTRACORPOREAL SHOCK WAVE LITHOTRIPSY  09-05-2010   RIGHT  . NESBIT PROCEDURE  10-17-2004   CORRECTION OF PENILE ANGULATION (PEYRONIES DISEASE)     Current Outpatient Prescriptions  Medication Sig Dispense Refill  . amLODipine (NORVASC) 2.5 MG tablet Take 1 tablet (2.5 mg total) by mouth every morning. 90 tablet 1  . aspirin (LONGS ADULT LOW STRENGTH ASA) 81 MG EC tablet Take 81 mg by mouth daily.     . cholecalciferol (VITAMIN D) 1000 UNITS tablet Take 1,000 Units by mouth daily.    Marland Kitchen doxycycline (VIBRAMYCIN) 100 MG capsule Take 100 mg by mouth. Take one-half tablet  by mouth every other day    . fish oil-omega-3 fatty acids 1000 MG capsule Take 2 g by mouth 2 (two) times daily.    . fluticasone (FLONASE) 50 MCG/ACT nasal spray One to 2 sprays each nostril at bedtime 16 g 6  . Interferon Beta-1b (BETASERON) 0.3 MG KIT injection Inject 0.25 mg into the skin every other day. 1 kit 6  . isosorbide mononitrate (IMDUR) 60 MG 24 hr tablet TAKE ONE-HALF (1/2) TABLET (30MG) BY MOUTH EVERY DAY 15 tablet 5  . levothyroxine (SYNTHROID) 50 MCG tablet Take 1 tablet (50 mcg total) by mouth daily before breakfast. 90 tablet 2  . lisinopril  (PRINIVIL,ZESTRIL) 10 MG tablet TAKE ONE TABLET BY MOUTH DAILY 90 tablet 3  . metFORMIN (GLUCOPHAGE) 500 MG tablet TAKE TWO TABLETS (1000MG) BY MOUTH TWICEDAILY WITH MEALS 120 tablet 4  . mupirocin ointment (BACTROBAN) 2 % Apply 1 application topically 2 (two) times daily. 22 g 1  . omeprazole (PRILOSEC) 20 MG capsule Take 20 mg by mouth daily.    . pioglitazone (ACTOS) 30 MG tablet TAKE ONE TABLET BY MOUTH EVERY DAY 30 tablet 3  . rosuvastatin (CRESTOR) 20 MG tablet Take 1 tablet (20 mg total) by mouth daily. As directed 90 tablet 1  . Tamsulosin HCl (FLOMAX) 0.4 MG CAPS Take 0.4 mg by mouth daily after supper.     . cyanocobalamin (,VITAMIN B-12,) 1000 MCG/ML injection Inject 1 mL (1,000 mcg total) into the skin once. 3 mL 3  . glucose blood test strip Use as instructed 100 each 12  . Tuberculin-Allergy Syringes (ALLERGY SYRINGE 1CC/27GX1/2") 27G X 1/2" 1 ML MISC Injection once monthly- B 12 shots 100 each 1   Current Facility-Administered Medications  Medication Dose Route Frequency Provider Last Rate Last Dose  . cyanocobalamin ((VITAMIN B-12)) injection 1,000 mcg  1,000 mcg Intramuscular Q30 days Chipper Herb, MD   1,000 mcg at 03/02/15 1647    Allergies:   Review of patient's allergies indicates no known allergies.    ROS:  Please see the history of present illness.   Otherwise, review of systems are positive for none.   All other systems are reviewed and negative.    PHYSICAL EXAM: VS:  BP 100/64   Pulse 82   Ht 5' 6"  (1.676 m)   Wt 153 lb (69.4 kg)   BMI 24.69 kg/m  , BMI Body mass index is 24.69 kg/m. GENERAL:  Well appearing HEENT:  Pupils equal round and reactive, fundi not visualized, oral mucosa unremarkable NECK:  No jugular venous distention, waveform within normal limits, carotid upstroke brisk and symmetric, no bruits, no thyromegaly LYMPHATICS:  No cervical, inguinal adenopathy LUNGS:  Clear to auscultation bilaterally BACK:  No CVA tenderness CHEST:   Unremarkable HEART:  PMI not displaced or sustained,S1 and S2 within normal limits, no S3, no S4, no clicks, no rubs, no murmurs ABD:  Flat, positive bowel sounds normal in frequency in pitch, no bruits, no rebound, no guarding, no midline pulsatile mass, no hepatomegaly, no splenomegaly EXT:  2 plus pulses throughout, no edema, no cyanosis no clubbing    EKG:  EKG is ordered today. The ekg ordered today demonstrates sinus rhythm, rate 82, right axis deviation, right bundle branch block, no acute ST-T wave abnormalities, no change from previous.   Recent Labs: 02/19/2015: Hemoglobin 11.3 11/23/2015: TSH 4.610 01/04/2016: ALT 19; BUN 22; Creatinine, Ser 0.98; Platelets 190; Potassium 4.3; Sodium 145   Lab Results  Component Value Date  HGBA1C 6.9 07/05/2015    Lipid Panel    Component Value Date/Time   CHOL 196 11/23/2015 1107   CHOL 197 05/14/2013 1657   CHOL 196 01/06/2013 1311   TRIG 90 11/23/2015 1107   TRIG 149 01/06/2013 1311   HDL 30 (L) 11/23/2015 1107   HDL 29 (L) 01/06/2013 1311   CHOLHDL 6.8 (H) 05/14/2013 1657   CHOLHDL 7.0 02/26/2009 0145   VLDL 22 02/26/2009 0145   LDLCALC 77 01/28/2014 1139   LDLCALC 137 (H) 01/06/2013 1311      Wt Readings from Last 3 Encounters:  02/09/16 153 lb (69.4 kg)  12/30/15 150 lb (68 kg)  11/23/15 153 lb (69.4 kg)      Other studies Reviewed: Additional studies/ records that were reviewed today include: none. Review of the above records demonstrates:    ASSESSMENT AND PLAN:  CAD:  The patient has no new sypmtoms.  No further cardiovascular testing is indicated.  We will continue with aggressive risk reduction and meds as listed.  DYSLIPIDEMIA:   his LDL recently was 148. I reviewed his profile. However, he been off of Crestor because it was not generic. He has just been restarted on this. I will defer follow-up to Dr. Laurance Flatten.  HTN :  The blood pressure is at target. No change in medications is indicated. We will continue  with therapeutic lifestyle changes (TLC).  TOBACCO ABUSE:  We talked again about stopping smokeless tobacco.   URINARY FREQUENCY/HEMATURIA:  He wants to hold off seeing a urologist. He does agree to get a UA today. I will follow-up on this and with Dr. Laurance Flatten know if it is abnormal.   Current medicines are reviewed at length with the patient today.  The patient does not have concerns regarding medicines.  The following changes have been made:  no change  Labs/ tests ordered today include:   Orders Placed This Encounter  Procedures  . Urine Culture  . Urinalysis  . EKG 12-Lead     Disposition:   FU with me as needed.      Signed, Minus Breeding, MD  02/09/2016 11:30 AM    [facility]

## 2016-02-07 ENCOUNTER — Telehealth: Payer: Self-pay | Admitting: Family Medicine

## 2016-02-07 MED ORDER — LEVOTHYROXINE SODIUM 50 MCG PO TABS: 50.0000 ug | ORAL_TABLET | Freq: Every day | ORAL | 2 refills | Status: DC

## 2016-02-07 NOTE — Telephone Encounter (Signed)
Rx sent to the pharmacy for new dose of levothyroxine and pt is aware also aware we have already faxed all records to GI.

## 2016-02-09 ENCOUNTER — Ambulatory Visit (INDEPENDENT_AMBULATORY_CARE_PROVIDER_SITE_OTHER): Payer: PPO | Admitting: Cardiology

## 2016-02-09 ENCOUNTER — Encounter: Payer: Self-pay | Admitting: Cardiology

## 2016-02-09 ENCOUNTER — Other Ambulatory Visit: Payer: PPO

## 2016-02-09 VITALS — BP 100/64 | HR 82 | Ht 66.0 in | Wt 153.0 lb

## 2016-02-09 DIAGNOSIS — R350 Frequency of micturition: Secondary | ICD-10-CM

## 2016-02-09 DIAGNOSIS — I10 Essential (primary) hypertension: Secondary | ICD-10-CM | POA: Diagnosis not present

## 2016-02-09 NOTE — Patient Instructions (Signed)
Medication Instructions:  The current medical regimen is effective;  continue present plan and medications.  Labwork: Please have urinalysis at [facility].  Follow-Up: Follow up as needed with Dr Percival Spanish  Thank you for choosing [facility]!!

## 2016-02-10 ENCOUNTER — Other Ambulatory Visit (HOSPITAL_COMMUNITY): Payer: Self-pay | Admitting: Gastroenterology

## 2016-02-10 DIAGNOSIS — R110 Nausea: Secondary | ICD-10-CM | POA: Diagnosis not present

## 2016-02-10 DIAGNOSIS — R1031 Right lower quadrant pain: Secondary | ICD-10-CM

## 2016-02-10 NOTE — Addendum Note (Signed)
Addended by: Shellia Cleverly on: 02/10/2016 04:18 PM   Modules accepted: Orders

## 2016-02-11 ENCOUNTER — Telehealth: Payer: Self-pay | Admitting: *Deleted

## 2016-02-11 ENCOUNTER — Other Ambulatory Visit: Payer: Self-pay | Admitting: Family Medicine

## 2016-02-11 LAB — URINALYSIS, COMPLETE
Bilirubin, UA: NEGATIVE
GLUCOSE, UA: NEGATIVE
Ketones, UA: NEGATIVE
Leukocytes, UA: NEGATIVE
Nitrite, UA: NEGATIVE
PH UA: 5.5 (ref 5.0–7.5)
PROTEIN UA: NEGATIVE
RBC, UA: NEGATIVE
SPEC GRAV UA: 1.013 (ref 1.005–1.030)
Urobilinogen, Ur: 0.2 mg/dL (ref 0.2–1.0)

## 2016-02-11 LAB — MICROSCOPIC EXAMINATION
CASTS: NONE SEEN /LPF
Epithelial Cells (non renal): NONE SEEN /hpf (ref 0–10)

## 2016-02-11 LAB — URINE CULTURE

## 2016-02-11 MED ORDER — SULFAMETHOXAZOLE-TRIMETHOPRIM 800-160 MG PO TABS: 1.0000 | ORAL_TABLET | Freq: Two times a day (BID) | ORAL | 0 refills | Status: DC

## 2016-02-11 NOTE — Progress Notes (Signed)
Please contact the patient regarding:Antibiotic sent for infection

## 2016-02-11 NOTE — Telephone Encounter (Signed)
Pt notified of results Verbalizes understanding 

## 2016-02-11 NOTE — Telephone Encounter (Signed)
-----   Message from Claretta Fraise, MD sent at 02/11/2016  6:02 PM EDT ----- Please contact the patient regarding:Antibiotic sent for infection

## 2016-02-16 ENCOUNTER — Encounter: Payer: Self-pay | Admitting: Neurology

## 2016-02-16 ENCOUNTER — Ambulatory Visit (INDEPENDENT_AMBULATORY_CARE_PROVIDER_SITE_OTHER): Payer: PPO | Admitting: Neurology

## 2016-02-16 VITALS — BP 122/69 | HR 64 | Ht 66.0 in | Wt 154.5 lb

## 2016-02-16 DIAGNOSIS — M21371 Foot drop, right foot: Secondary | ICD-10-CM | POA: Diagnosis not present

## 2016-02-16 DIAGNOSIS — R5383 Other fatigue: Secondary | ICD-10-CM

## 2016-02-16 DIAGNOSIS — R39198 Other difficulties with micturition: Secondary | ICD-10-CM | POA: Diagnosis not present

## 2016-02-16 DIAGNOSIS — G35 Multiple sclerosis: Secondary | ICD-10-CM | POA: Diagnosis not present

## 2016-02-16 DIAGNOSIS — R261 Paralytic gait: Secondary | ICD-10-CM

## 2016-02-16 NOTE — Progress Notes (Signed)
[facility]  PATIENT: Jacob Fowler DOB: 03/13/1951  REFERRING DOCTOR OR PCP:  Redge Gainer SOURCE: patient  _________________________________   HISTORICAL  CHIEF COMPLAINT:  Chief Complaint  Patient presents with  . Multiple Sclerosis    He is here with his wife, Jacob Fowler.  States he is doing well overall.  No new concerns.  He is using a cane to assist with ambulation.      HISTORY OF PRESENT ILLNESS:  Jacob Fowler is a 65 year old man with multiple sclerosis.     He is on Betaseron. He tolerates it well and he has not had any definite exacerbations recently.    We discussed other options but since he tolerates betaseron well, he prefers to stay on.  Gait/strength/sensation: He has poor gait due to right >> left leg weakness and spasticity.   The  right AFO helps his foot drop some.   He is falling 1 -2 times a  month, usually inside the house.   He loses balance and then falls.   Sometimes he falls when the right foot catches on the floor or something.  He uses a cane (not always in home).      He denies any numbness in the legs. 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   He denies any significant numbness, weakness or clumsiness in the arms.   He denies painful  spasms.     He tried Ampyra in the past but it has not helped.     Bladder: He has urinary frequency,  urgency and urinary hesitancy. Tamsulosin with ditropan has helped the urinary symptoms.   Now he needs to urinate q 2-3 hours during the day and once at night.  He has not had any incontinence.     Vision: He denies any MS related visual problems. He wears glasses for correction.  Fatigue/sleep: He feels fatigued every day, worse in the afternoons and with heat.  He feels fatigue is mostly physically.   Ampyra did not help. Amantadine did not help   He sually starts every day feeling well rested.     He feels that the fatigue has worsened over the last year. He  feels he is sleeping well most nights.   He does not snore much and his wife has not noted any apnea.   Mood/cognition:   He denies depression or anxiety. He feels his short-term memory is mildly worse than last year but notes no other cognitive issue.   MS History:   He began to note significant difficulty with gait in January 2010.      He had some back x-rays. He was then referred to neurosurgery that noted that he had plaques in his spine c/w  MS. He had further testing including MRI of the brain and spinal tap. These were all consistent with multiple sclerosis and by November 2010 he had started Betaseron. He feels that he has been mostly stable over the past 5 years but has noted that his gait is a little worse than it was in 2010. About 2013, he started using a cane.  NEW DATA SINCE LAST VISIT: MRI Images 09/30/2014 were personally reviewed and compared.  I concur with the interpretations: Abnormal MRI cervical spine (with and without) demonstrating: 1. Chronic demyelinating spinal cord plaques at C4-5 on the left and T1 on the right. 2. No acute plaques. 3. No significant change from MRI on 09/22/10.  Abnormal MRI brain (  with and without) demonstrating: 1. Multiple supratentorial chronic demyelinating plaques. Some of these are hypointense on T1 views. 2. No acute plaques. 3. No change from MRI on 09/22/10.    REVIEW OF SYSTEMS: Constitutional: No fevers, chills, sweats, or change in appetite.  He notes fatigue  Eyes: No visual changes, double vision, eye pain Ear, nose and throat: No hearing loss, ear pain, nasal congestion, sore throat Cardiovascular: No chest pain, palpitations Respiratory: No shortness of breath at rest or with exertion.   No wheezes GastrointestinaI: No nausea, vomiting, diarrhea, abdominal pain, fecal incontinence Genitourinary: urinary frequency and hesitancy  .  No nocturia now Musculoskeletal: No neck pain, back pain Integumentary: No rash, pruritus, skin  lesions Neurological: as above Psychiatric: No depression at this time.  No anxiety Endocrine: No palpitations, diaphoresis, change in appetite, change in weigh or increased thirst Hematologic/Lymphatic: No anemia, purpura, petechiae. Allergic/Immunologic: No itchy/runny eyes, nasal congestion, recent allergic reactions, rashes  ALLERGIES: No Known Allergies  HOME MEDICATIONS:  Current Outpatient Prescriptions:  .  amLODipine (NORVASC) 2.5 MG tablet, Take 1 tablet (2.5 mg total) by mouth every morning., Disp: 90 tablet, Rfl: 1 .  aspirin (LONGS ADULT LOW STRENGTH ASA) 81 MG EC tablet, Take 81 mg by mouth daily. , Disp: , Rfl:  .  cholecalciferol (VITAMIN D) 1000 UNITS tablet, Take 1,000 Units by mouth daily., Disp: , Rfl:  .  cyanocobalamin (,VITAMIN B-12,) 1000 MCG/ML injection, Inject 1 mL (1,000 mcg total) into the skin once., Disp: 3 mL, Rfl: 3 .  doxycycline (VIBRAMYCIN) 100 MG capsule, Take 100 mg by mouth. Take one-half tablet by mouth every other day, Disp: , Rfl:  .  fish oil-omega-3 fatty acids 1000 MG capsule, Take 2 g by mouth 2 (two) times daily., Disp: , Rfl:  .  fluticasone (FLONASE) 50 MCG/ACT nasal spray, One to 2 sprays each nostril at bedtime, Disp: 16 g, Rfl: 6 .  glucose blood test strip, Use as instructed, Disp: 100 each, Rfl: 12 .  Interferon Beta-1b (BETASERON) 0.3 MG KIT injection, Inject 0.25 mg into the skin every other day., Disp: 1 kit, Rfl: 6 .  isosorbide mononitrate (IMDUR) 60 MG 24 hr tablet, TAKE ONE-HALF (1/2) TABLET (30MG) BY MOUTH EVERY DAY, Disp: 15 tablet, Rfl: 5 .  levothyroxine (SYNTHROID) 50 MCG tablet, Take 1 tablet (50 mcg total) by mouth daily before breakfast., Disp: 90 tablet, Rfl: 2 .  lisinopril (PRINIVIL,ZESTRIL) 10 MG tablet, TAKE ONE TABLET BY MOUTH DAILY, Disp: 90 tablet, Rfl: 3 .  metFORMIN (GLUCOPHAGE) 500 MG tablet, TAKE TWO TABLETS (1000MG) BY MOUTH TWICEDAILY WITH MEALS, Disp: 120 tablet, Rfl: 4 .  mupirocin ointment (BACTROBAN) 2  %, Apply 1 application topically 2 (two) times daily., Disp: 22 g, Rfl: 1 .  omeprazole (PRILOSEC) 20 MG capsule, Take 20 mg by mouth daily., Disp: , Rfl:  .  pioglitazone (ACTOS) 30 MG tablet, TAKE ONE TABLET BY MOUTH EVERY DAY, Disp: 30 tablet, Rfl: 3 .  rosuvastatin (CRESTOR) 20 MG tablet, Take 1 tablet (20 mg total) by mouth daily. As directed, Disp: 90 tablet, Rfl: 1 .  sulfamethoxazole-trimethoprim (BACTRIM DS,SEPTRA DS) 800-160 MG tablet, Take 1 tablet by mouth 2 (two) times daily., Disp: 14 tablet, Rfl: 0 .  Tamsulosin HCl (FLOMAX) 0.4 MG CAPS, Take 0.4 mg by mouth daily after supper. , Disp: , Rfl:  .  Tuberculin-Allergy Syringes (ALLERGY SYRINGE 1CC/27GX1/2") 27G X 1/2" 1 ML MISC, Injection once monthly- B 12 shots, Disp: 100 each, Rfl: 1  Current Facility-Administered Medications:  .  cyanocobalamin ((VITAMIN B-12)) injection 1,000 mcg, 1,000 mcg, Intramuscular, Q30 days, Chipper Herb, MD, 1,000 mcg at 03/02/15 1647  PAST MEDICAL HISTORY: Past Medical History:  Diagnosis Date  . Acquired leg length discrepancy   . Bladder stone   . BPH associated with nocturia   . CAD (coronary artery disease) CARDIOLOGIST- DR HOCHREIN--  LAST VISIT 09-27-2011 IN EPIC   Non obstructive Cath 2010-  HX CORONARY SPASM 2004  . GERD (gastroesophageal reflux disease)   . H/O hiatal hernia   . History of nephrolithiasis 2009  . Hx of adenomatous colonic polyps   . Hyperlipidemia   . Hypertension   . Movement disorder   . Multiple sclerosis ([place]) DX  10/10---  NEUROLOGIST  DR Delfino Lovett [facility] ([place])   RIGHT SIDE AFFECTED MORE W/ WEAKNESS- - USES CANE  . Neuromuscular disorder (HCC)    MS  . NIDDM (non-insulin dependent diabetes mellitus)   . Peyronie's disease   . RBBB   . Renal stone RIGHT  . Right ureteral stone   . Rosacea   . Vision abnormalities     PAST SURGICAL HISTORY: Past Surgical History:  Procedure Laterality Date  . CARDIAC CATHETERIZATION  02-26-2009 ---  DR Ida Rogue   NONOBSTRUCTIVE CAD/ 50% DISTAL RCA/ 30% LAD / NORMAL LVF  . CYSTOSCOPY W/ RETROGRADES  10/30/2011   Procedure: CYSTOSCOPY WITH RETROGRADE PYELOGRAM;  Surgeon: Claybon Jabs, MD;  Location: [facility];  Service: Urology;  Laterality: Right;  . CYSTOSCOPY WITH LITHOLAPAXY  10/30/2011   Procedure: CYSTOSCOPY WITH LITHOLAPAXY;  Surgeon: Claybon Jabs, MD;  Location: [facility];  Service: Urology;  Laterality: N/A;  . EXTRACORPOREAL SHOCK WAVE LITHOTRIPSY  09-05-2010   RIGHT  . NESBIT PROCEDURE  10-17-2004   CORRECTION OF PENILE ANGULATION (PEYRONIES DISEASE)    FAMILY HISTORY: Family History  Problem Relation Age of Onset  . Stroke Mother   . Heart attack Mother   . Renal cancer Father     SOCIAL HISTORY:  Social History   Social History  . Marital status: Married    Spouse name: N/A  . Number of children: N/A  . Years of education: N/A   Occupational History  . Not on file.   Social History Main Topics  . Smoking status: Former Smoker    Packs/day: 1.00    Years: 7.00    Types: Cigarettes    Quit date: 09/26/1973  . Smokeless tobacco: Current User    Types: Chew     Comment: CHEW TOBACCO FOR 35 YRS  . Alcohol use No  . Drug use: No  . Sexual activity: Not on file   Other Topics Concern  . Not on file   Social History Narrative  . No narrative on file     PHYSICAL EXAM  Vitals:   02/16/16 0919  BP: 122/69  Pulse: 64  Weight: 154 lb 8 oz (70.1 kg)  Height: 5' 6"  (1.676 m)    Body mass index is 24.94 kg/m.   General: The patient is well-developed and well-nourished and in no acute distress  Neurologic Exam  Mental status: The patient is alert and oriented x 3 at the time of the examination. The patient has apparent normal recent and remote memory, with an apparently normal attention span and concentration ability.   Speech is normal.  Cranial nerves: Extraocular movements are full.   There is good facial  sensation to soft  touch bilaterally.Facial strength is normal.  Trapezius and sternocleidomastoid strength is normal. No dysarthria is noted.  The tongue is midline, and the patient has symmetric elevation of the soft palate. No obvious hearing deficits are noted.  Motor:  Muscle bulk is normal.   Tone is increased in legs, right greater than left. Strength is 2 / 5 hip flexors and 3 quads and 4-/5 distally in the right leg and 4/5 in the left leg. Strength is  5 / 5 in arms.     Sensory: Sensory testing is intact to touch and vibration sensation in all 4 extremities.  Coordination: Cerebellar testing reveals good finger-nose-finger .    Gait and station: Station is normal.   Gait is spastic and has a right foot drop but both legs seem weak. The knee hyperextends a little bit on the right as he walks. He is unable to tandem gait.     Romberg is negative  Reflexes: Deep tendon reflexes are normal in arms and increased in his legs with spread at the right knee. He has 2 beats of nonsustained clonus on the right ankle.Marland Kitchen      DIAGNOSTIC DATA (LABS, IMAGING, TESTING) - I reviewed patient records, labs, notes, testing and imaging myself where available.  Lab Results  Component Value Date   WBC 5.9 01/04/2016   HGB 11.3 (A) 02/19/2015   HCT 38.9 01/04/2016   MCV 85 01/04/2016   PLT 190 01/04/2016      Component Value Date/Time   NA 145 (H) 01/04/2016 0812   K 4.3 01/04/2016 0812   CL 104 01/04/2016 0812   CO2 21 01/04/2016 0812   GLUCOSE 133 (H) 01/04/2016 0812   GLUCOSE 130 (H) 01/06/2013 1311   BUN 22 01/04/2016 0812   CREATININE 0.98 01/04/2016 0812   CREATININE 1.13 01/06/2013 1311   CALCIUM 10.3 (H) 01/04/2016 0812   PROT 7.7 01/04/2016 0812   ALBUMIN 4.8 01/04/2016 0812   AST 24 01/04/2016 0812   ALT 19 01/04/2016 0812   ALKPHOS 67 01/04/2016 0812   BILITOT 0.3 01/04/2016 0812   GFRNONAA 81 01/04/2016 0812   GFRNONAA 69 01/06/2013 1311   GFRAA 93 01/04/2016 0812   GFRAA  80 01/06/2013 1311   Lab Results  Component Value Date   CHOL 196 11/23/2015   HDL 30 (L) 11/23/2015   LDLCALC 77 01/28/2014   TRIG 90 11/23/2015   CHOLHDL 6.8 (H) 05/14/2013   Lab Results  Component Value Date   HGBA1C 6.9 07/05/2015        ASSESSMENT AND PLAN  Multiple sclerosis (HCC)  Spastic gait  Right foot drop  Other fatigue  Urinary dysfunction  1.   Continue Betaseron.   Recent labs at PCP (12/2015) were fine --- lymphocytes mildly low other labs ok 2.  Continue tamsulosin with oxybutynin for bladder 3.   He is active.    I recommended a walker for safety.  He prefers using his cane but does use the walker at times 4.  He will return to see me in 6 months or sooner if he has new or worsening neurologic symptoms.   Richard A. Felecia Shelling, MD, PhD 1/0/2725, 36:64 AM Certified in Neurology, Clinical Neurophysiology, Sleep Medicine, Pain Medicine and Neuroimaging  [facility] [address] [place], [place] [address] [phone]

## 2016-02-29 ENCOUNTER — Other Ambulatory Visit: Payer: Self-pay | Admitting: Family Medicine

## 2016-03-03 ENCOUNTER — Ambulatory Visit (HOSPITAL_COMMUNITY)
Admission: RE | Admit: 2016-03-03 | Discharge: 2016-03-03 | Disposition: A | Discharge status: Home / Self Care | Payer: PPO | Source: Ambulatory Visit | Attending: Gastroenterology | Admitting: Gastroenterology

## 2016-03-03 DIAGNOSIS — K210 Gastro-esophageal reflux disease with esophagitis: Secondary | ICD-10-CM | POA: Diagnosis not present

## 2016-03-03 DIAGNOSIS — R1031 Right lower quadrant pain: Secondary | ICD-10-CM | POA: Insufficient documentation

## 2016-03-03 DIAGNOSIS — R110 Nausea: Secondary | ICD-10-CM | POA: Insufficient documentation

## 2016-03-03 MED ORDER — TECHNETIUM TC 99M SULFUR COLLOID: 2.1000 | Freq: Once | INTRAVENOUS | Status: DC | PRN

## 2016-03-06 ENCOUNTER — Telehealth: Payer: Self-pay | Admitting: Neurology

## 2016-03-06 NOTE — Telephone Encounter (Signed)
Noted/fim 

## 2016-03-06 NOTE — Telephone Encounter (Signed)
FYI-Patient is calling to let you know he will be bringing by forms to be filled out.

## 2016-03-07 DIAGNOSIS — L570 Actinic keratosis: Secondary | ICD-10-CM | POA: Diagnosis not present

## 2016-03-08 NOTE — Telephone Encounter (Signed)
I have spoken with Jacob Fowler this afternoon and advised that Disability paperwork has been completed.  I have mailed it to Benefits Dept. in a stamped/addressed envelope provided by pt.  Copy mailed to pt. for his records.  Copy sent to be scanned into EMR/fim

## 2016-03-13 ENCOUNTER — Other Ambulatory Visit: Payer: Self-pay | Admitting: Gastroenterology

## 2016-03-13 ENCOUNTER — Other Ambulatory Visit (HOSPITAL_COMMUNITY): Payer: Self-pay | Admitting: Gastroenterology

## 2016-03-13 DIAGNOSIS — R110 Nausea: Secondary | ICD-10-CM

## 2016-03-13 DIAGNOSIS — R1031 Right lower quadrant pain: Secondary | ICD-10-CM

## 2016-04-05 ENCOUNTER — Other Ambulatory Visit: Payer: Self-pay | Admitting: Family Medicine

## 2016-04-10 ENCOUNTER — Encounter: Payer: Self-pay | Admitting: Family Medicine

## 2016-04-10 ENCOUNTER — Ambulatory Visit (HOSPITAL_COMMUNITY)
Admission: RE | Admit: 2016-04-10 | Discharge: 2016-04-10 | Disposition: A | Discharge status: Home / Self Care | Payer: PPO | Source: Ambulatory Visit | Attending: Gastroenterology | Admitting: Gastroenterology

## 2016-04-10 ENCOUNTER — Ambulatory Visit (INDEPENDENT_AMBULATORY_CARE_PROVIDER_SITE_OTHER): Payer: PPO | Admitting: Family Medicine

## 2016-04-10 ENCOUNTER — Ambulatory Visit (INDEPENDENT_AMBULATORY_CARE_PROVIDER_SITE_OTHER): Payer: PPO

## 2016-04-10 VITALS — BP 112/66 | HR 66 | Temp 97.1°F | Ht 66.0 in | Wt 150.0 lb

## 2016-04-10 DIAGNOSIS — I10 Essential (primary) hypertension: Secondary | ICD-10-CM | POA: Diagnosis not present

## 2016-04-10 DIAGNOSIS — E559 Vitamin D deficiency, unspecified: Secondary | ICD-10-CM | POA: Diagnosis not present

## 2016-04-10 DIAGNOSIS — R110 Nausea: Secondary | ICD-10-CM

## 2016-04-10 DIAGNOSIS — D519 Vitamin B12 deficiency anemia, unspecified: Secondary | ICD-10-CM | POA: Diagnosis not present

## 2016-04-10 DIAGNOSIS — E119 Type 2 diabetes mellitus without complications: Secondary | ICD-10-CM

## 2016-04-10 DIAGNOSIS — R1031 Right lower quadrant pain: Secondary | ICD-10-CM | POA: Diagnosis not present

## 2016-04-10 DIAGNOSIS — E785 Hyperlipidemia, unspecified: Secondary | ICD-10-CM | POA: Diagnosis not present

## 2016-04-10 DIAGNOSIS — E039 Hypothyroidism, unspecified: Secondary | ICD-10-CM | POA: Diagnosis not present

## 2016-04-10 DIAGNOSIS — Z23 Encounter for immunization: Secondary | ICD-10-CM

## 2016-04-10 DIAGNOSIS — I700 Atherosclerosis of aorta: Secondary | ICD-10-CM | POA: Insufficient documentation

## 2016-04-10 DIAGNOSIS — E1169 Type 2 diabetes mellitus with other specified complication: Secondary | ICD-10-CM

## 2016-04-10 DIAGNOSIS — R109 Unspecified abdominal pain: Secondary | ICD-10-CM | POA: Diagnosis not present

## 2016-04-10 DIAGNOSIS — I2510 Atherosclerotic heart disease of native coronary artery without angina pectoris: Secondary | ICD-10-CM

## 2016-04-10 MED ORDER — TECHNETIUM TC 99M MEBROFENIN IV KIT
5.2400 | PACK | Freq: Once | INTRAVENOUS | Status: AC | PRN
  Administered 2016-04-10: 5.24 via INTRAVENOUS

## 2016-04-10 NOTE — Patient Instructions (Addendum)
Medicare Annual Wellness Visit  [facility] and the medical providers at [facility] strive to bring you the best medical care.  In doing so we not only want to address your current medical conditions and concerns but also to detect new conditions early and prevent illness, disease and health-related problems.    Medicare offers a yearly Wellness Visit which allows our clinical staff to assess your need for preventative services including immunizations, lifestyle education, counseling to decrease risk of preventable diseases and screening for fall risk and other medical concerns.    This visit is provided free of charge (no copay) for all Medicare recipients. The clinical pharmacists at [facility] have begun to conduct these Wellness Visits which will also include a thorough review of all your medications.    As you primary medical provider recommend that you make an appointment for your Annual Wellness Visit if you have not done so already this year.  You may set up this appointment before you leave today or you may call back [phone] and schedule an appointment.  Please make sure when you call that you mention that you are scheduling your Annual Wellness Visit with the clinical pharmacist so that the appointment may be made for the proper length of time.     Continue current medications. Continue good therapeutic lifestyle changes which include good diet and exercise. Fall precautions discussed with patient. If an FOBT was given today- please return it to our front desk. If you are over 66 years old - you may need Prevnar 65 or the adult Pneumonia vaccine.  **Flu shots are available--- please call and schedule a FLU-CLINIC appointment**  After your visit with [place] today you will receive a survey in the mail or online from [employer] regarding your care with [place]. Please take a moment to fill this out. Your feedback is very  important to [place] as you can help [place] better understand your patient needs as well as improve your experience and satisfaction. WE CARE ABOUT YOU!!!   Follow-up with gastroenterology as planned Follow-up with neurology as planned If you start feeling bad and you cannot figure out what the problem is make sure you get back in touch with [place] as soon as possible or go to the emergency room if office is closed Flu shot that you received today may make your arm sore

## 2016-04-10 NOTE — Progress Notes (Signed)
Subjective:    [name], male    DOB: [birth year], 65 y.o.   MRN: [id number]  HPI Pt here for follow up and management of chronic medical problems which includes hypothyroid, diabetes, hypertension and hyperlipidemia. He is taking medications regularly.The patient is doing well today with no specific complaints. He is due to get a chest x-ray and his flu shot today. He will also have his lab work drawn. The neurologist that he is currently seeing is in the Epic system and can't see any labs that are done. The patient had a urinary tract infection this summer and was treated with antibiotics and this is better. He still has the persistent nausea and is currently being followed by the gastroenterologist for further studies and gallbladder function. He had a test for this this morning already. He denies any chest pain or shortness of breath. He also saw the cardiologist and the cardiologist has released him to come back only if needed. He denies any shortness of breath. He denies any change in his bowel habits. He does have the persistent nausea which is been going on now for several months and as mentioned the gastroenterologist is in the process of working this up further. Since he had a urinary tract infection he is doing better with voiding. He has had oral intake this morning because of the studies and we will delay getting his blood work until next week and also get a urinalysis with that. He continues to see the neurologist for his MS.     Patient Active Problem List   Diagnosis Date Noted  . Rosacea 11/23/2015  . Urinary dysfunction 08/19/2015  . Right foot drop 02/17/2015  . Urinary disorder 02/17/2015  . Insomnia 02/17/2015  . Other fatigue 02/17/2015  . 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 Bladder disorder 09/16/2014  . Multiple sclerosis ([place]) 01/06/2013  . Ureteral calculus, right 10/30/2011  . Bladder calculus 10/30/2011  . Tobacco use 09/27/2011  . CORONARY  ATHEROSCLEROSIS, NATIVE VESSEL 10/06/2009  . Diabetes mellitus type 2 in nonobese ([place]) 10/01/2009  . Hyperlipidemia associated with type 2 diabetes mellitus ([place]) 10/01/2009  . Essential hypertension 10/01/2009   Outpatient Encounter Prescriptions as of 04/10/2016  Medication Sig  . amLODipine (NORVASC) 2.5 MG tablet TAKE ONE TABLET BY MOUTH EVERY MORNING  . aspirin (LONGS ADULT LOW STRENGTH ASA) 81 MG EC tablet Take 81 mg by mouth daily.   . cholecalciferol (VITAMIN D) 1000 UNITS tablet Take 1,000 Units by mouth daily.  . cyanocobalamin (,VITAMIN B-12,) 1000 MCG/ML injection INJECT 1ML (1000MCG) INTO THE SKIN ONCE A MONTH  . doxycycline (VIBRAMYCIN) 100 MG capsule Take 100 mg by mouth. Take one-half tablet by mouth every other day  . fish oil-omega-3 fatty acids 1000 MG capsule Take 2 g by mouth 2 (two) times daily.  . fluticasone (FLONASE) 50 MCG/ACT nasal spray One to 2 sprays each nostril at bedtime  . glucose blood test strip Use as instructed  . Interferon Beta-1b (BETASERON) 0.3 MG KIT injection Inject 0.25 mg into the skin every other day.  . isosorbide mononitrate (IMDUR) 60 MG 24 hr tablet TAKE ONE-HALF (1/2) TABLET (30MG) BY MOUTH EVERY DAY  . levothyroxine (SYNTHROID) 50 MCG tablet Take 1 tablet (50 mcg total) by mouth daily before breakfast.  . lisinopril (PRINIVIL,ZESTRIL) 10 MG tablet TAKE ONE TABLET BY MOUTH DAILY  . metFORMIN (GLUCOPHAGE) 500 MG tablet TAKE TWO TABLETS (1000MG) BY MOUTH TWICEDAILY WITH MEALS  . mupirocin ointment (BACTROBAN) 2 %  Apply 1 application topically 2 (two) times daily.  Marland Kitchen omeprazole (PRILOSEC) 20 MG capsule Take 20 mg by mouth daily.  . pioglitazone (ACTOS) 30 MG tablet TAKE ONE TABLET BY MOUTH EVERY DAY  . rosuvastatin (CRESTOR) 20 MG tablet Take 1 tablet (20 mg total) by mouth daily. As directed  . Tamsulosin HCl (FLOMAX) 0.4 MG CAPS Take 0.4 mg by mouth daily after supper.   . Tuberculin-Allergy Syringes (ALLERGY SYRINGE 1CC/27GX1/2") 27G X 1/2"  1 ML MISC Injection once monthly- B 12 shots  . [DISCONTINUED] sulfamethoxazole-trimethoprim (BACTRIM DS,SEPTRA DS) 800-160 MG tablet Take 1 tablet by mouth 2 (two) times daily.   Facility-Administered Encounter Medications as of 04/10/2016  Medication  . cyanocobalamin ((VITAMIN B-12)) injection 1,000 mcg      Review of Systems  Constitutional: Negative.   HENT: Negative.   Eyes: Negative.   Respiratory: Negative.   Cardiovascular: Negative.   Gastrointestinal: Negative.   Endocrine: Negative.   Genitourinary: Negative.   Musculoskeletal: Negative.   Skin: Negative.   Allergic/Immunologic: Negative.   Neurological: Negative.   Hematological: Negative.   Psychiatric/Behavioral: Negative.        Objective:   Physical Exam  Constitutional: He is oriented to person, place, and time. He appears well-developed and well-nourished. No distress.  Patient is followed by the neurologist regularly for his MS. He is still concerned about his persistent nausea and is currently seeing the gastroenterologist to further evaluate this.  HENT:  Head: Normocephalic and atraumatic.  Right Ear: External ear normal.  Left Ear: External ear normal.  Nose: Nose normal.  Mouth/Throat: Oropharynx is clear and moist. No oropharyngeal exudate.  Eyes: Conjunctivae and EOM are normal. Pupils are equal, round, and reactive to light. Right eye exhibits no discharge. Left eye exhibits no discharge. No scleral icterus.  Neck: Normal range of motion. Neck supple. No thyromegaly present.  No bruits or thyromegaly or anterior cervical adenopathy  Cardiovascular: Normal rate, regular rhythm, normal heart sounds and intact distal pulses.   No murmur heard. Heart is 72/m with a regular rate and rhythm  Pulmonary/Chest: Effort normal and breath sounds normal. No respiratory distress. He has no wheezes. He has no rales. He exhibits no tenderness.  The chest is clear anteriorly and posteriorly and there is no  axillary adenopathy  Abdominal: Soft. Bowel sounds are normal. He exhibits no mass. There is no tenderness. There is no rebound and no guarding.  No abdominal tenderness liver or spleen enlargement or masses or inguinal adenopathy  Musculoskeletal: Normal range of motion. He exhibits no edema.  The patient wears a support brace on the left lower leg because of his MS. He uses a cane for walking.  Lymphadenopathy:    He has no cervical adenopathy.  Neurological: He is alert and oriented to person, place, and time. He has normal reflexes. No cranial nerve deficit.  Skin: Skin is warm and dry. No rash noted.  Psychiatric: He has a normal mood and affect. His behavior is normal. Judgment and thought content normal.  Nursing note and vitals reviewed.  BP 112/66 (BP Location: Left Arm)   Pulse 66   Temp 97.1 F (36.2 C) (Oral)   Ht 5' 6"  (1.676 m)   Wt 150 lb (68 kg)   BMI 24.21 kg/m         Assessment & Plan:  1. Vitamin D deficiency -Continue current treatment pending results of lab work - CBC with Differential/Platelet - VITAMIN D 25 Hydroxy (Vit-D Deficiency, Fractures)  2. Hypothyroidism, unspecified hypothyroidism type -Continue current treatment - CBC with Differential/Platelet  3. Hyperlipidemia associated with type 2 diabetes mellitus ([place]) -Continue aggressive therapeutic lifestyle changes along with current treatment - CBC with Differential/Platelet - NMR, lipoprofile  4. Diabetes mellitus type 2 in nonobese ([facility]) -Continue current treatment pending results of A1c - CBC with Differential/Platelet - Bayer DCA Hb A1c Waived  5. Atherosclerosis of native coronary artery of native heart without angina pectoris -Continue aggressive therapeutic lifestyle changes along with current treatment - CBC with Differential/Platelet  6. Essential hypertension -The blood pressure is good today and he will continue with current treatment - BMP8+EGFR - CBC with  Differential/Platelet - Hepatic function panel - DG Chest 2 View; Future  7. B12 deficiency anemia -Continue with B12 injections - CBC with Differential/Platelet  8. Nauseated -Follow-up with gastroenterology  9. Senile purpura  Patient Instructions                       Medicare Annual Wellness Visit  [facility] and the medical providers at [facility] strive to bring you the best medical care.  In doing so we not only want to address your current medical conditions and concerns but also to detect new conditions early and prevent illness, disease and health-related problems.    Medicare offers a yearly Wellness Visit which allows our clinical staff to assess your need for preventative services including immunizations, lifestyle education, counseling to decrease risk of preventable diseases and screening for fall risk and other medical concerns.    This visit is provided free of charge (no copay) for all Medicare recipients. The clinical pharmacists at [facility] have begun to conduct these Wellness Visits which will also include a thorough review of all your medications.    As you primary medical provider recommend that you make an appointment for your Annual Wellness Visit if you have not done so already this year.  You may set up this appointment before you leave today or you may call back [phone]) and schedule an appointment.  Please make sure when you call that you mention that you are scheduling your Annual Wellness Visit with the clinical pharmacist so that the appointment may be made for the proper length of time.     Continue current medications. Continue good therapeutic lifestyle changes which include good diet and exercise. Fall precautions discussed with patient. If an FOBT was given today- please return it to our front desk. If you are over 54 years old - you may need Prevnar 31 or the adult Pneumonia vaccine.  **Flu shots  are available--- please call and schedule a FLU-CLINIC appointment**  After your visit with [place] today you will receive a survey in the mail or online from [employer] regarding your care with [place]. Please take a moment to fill this out. Your feedback is very important to [place] as you can help [place] better understand your patient needs as well as improve your experience and satisfaction. WE CARE ABOUT YOU!!!   Follow-up with gastroenterology as planned Follow-up with neurology as planned If you start feeling bad and you cannot figure out what the problem is make sure you get back in touch with [place] as soon as possible or go to the emergency room if office is closed Flu shot that you received today may make your arm sore   Arrie Senate MD

## 2016-04-12 ENCOUNTER — Telehealth: Payer: Self-pay

## 2016-04-12 NOTE — Telephone Encounter (Signed)
Pt returned call and is aware of CXR of results

## 2016-04-12 NOTE — Telephone Encounter (Signed)
[facility] to x-ray 

## 2016-04-14 ENCOUNTER — Other Ambulatory Visit: Payer: PPO

## 2016-04-14 DIAGNOSIS — E785 Hyperlipidemia, unspecified: Secondary | ICD-10-CM | POA: Diagnosis not present

## 2016-04-14 DIAGNOSIS — I10 Essential (primary) hypertension: Secondary | ICD-10-CM | POA: Diagnosis not present

## 2016-04-14 DIAGNOSIS — E039 Hypothyroidism, unspecified: Secondary | ICD-10-CM | POA: Diagnosis not present

## 2016-04-14 DIAGNOSIS — D519 Vitamin B12 deficiency anemia, unspecified: Secondary | ICD-10-CM | POA: Diagnosis not present

## 2016-04-14 DIAGNOSIS — E119 Type 2 diabetes mellitus without complications: Secondary | ICD-10-CM | POA: Diagnosis not present

## 2016-04-14 DIAGNOSIS — E1169 Type 2 diabetes mellitus with other specified complication: Secondary | ICD-10-CM | POA: Diagnosis not present

## 2016-04-14 DIAGNOSIS — I2510 Atherosclerotic heart disease of native coronary artery without angina pectoris: Secondary | ICD-10-CM | POA: Diagnosis not present

## 2016-04-14 DIAGNOSIS — E559 Vitamin D deficiency, unspecified: Secondary | ICD-10-CM | POA: Diagnosis not present

## 2016-04-14 LAB — BAYER DCA HB A1C WAIVED: HB A1C: 6.5 % (ref <7.0)

## 2016-04-15 LAB — HEPATIC FUNCTION PANEL
ALK PHOS: 61 IU/L (ref 39–117)
ALT: 15 IU/L (ref 0–44)
AST: 22 IU/L (ref 0–40)
Albumin: 4.4 g/dL (ref 3.6–4.8)
Bilirubin Total: 0.3 mg/dL (ref 0.0–1.2)
Bilirubin, Direct: 0.08 mg/dL (ref 0.00–0.40)
Total Protein: 6.9 g/dL (ref 6.0–8.5)

## 2016-04-15 LAB — CBC WITH DIFFERENTIAL/PLATELET
BASOS: 0 %
Basophils Absolute: 0 10*3/uL (ref 0.0–0.2)
EOS (ABSOLUTE): 0.1 10*3/uL (ref 0.0–0.4)
EOS: 2 %
HEMATOCRIT: 34.2 % — AB (ref 37.5–51.0)
HEMOGLOBIN: 11.3 g/dL — AB (ref 12.6–17.7)
Immature Grans (Abs): 0 10*3/uL (ref 0.0–0.1)
Immature Granulocytes: 0 %
LYMPHS ABS: 0.8 10*3/uL (ref 0.7–3.1)
Lymphs: 14 %
MCH: 26.8 pg (ref 26.6–33.0)
MCHC: 33 g/dL (ref 31.5–35.7)
MCV: 81 fL (ref 79–97)
MONOCYTES: 9 %
MONOS ABS: 0.5 10*3/uL (ref 0.1–0.9)
NEUTROS ABS: 4.2 10*3/uL (ref 1.4–7.0)
Neutrophils: 75 %
Platelets: 219 10*3/uL (ref 150–379)
RBC: 4.21 x10E6/uL (ref 4.14–5.80)
RDW: 14.9 % (ref 12.3–15.4)
WBC: 5.5 10*3/uL (ref 3.4–10.8)

## 2016-04-15 LAB — BMP8+EGFR
BUN / CREAT RATIO: 21 (ref 10–24)
BUN: 20 mg/dL (ref 8–27)
CO2: 24 mmol/L (ref 18–29)
CREATININE: 0.96 mg/dL (ref 0.76–1.27)
Calcium: 10.1 mg/dL (ref 8.6–10.2)
Chloride: 105 mmol/L (ref 96–106)
GFR calc non Af Amer: 82 mL/min/{1.73_m2} (ref >59)
GFR, EST AFRICAN AMERICAN: 95 mL/min/{1.73_m2} (ref >59)
GLUCOSE: 126 mg/dL — AB (ref 65–99)
Potassium: 4 mmol/L (ref 3.5–5.2)
SODIUM: 144 mmol/L (ref 134–144)

## 2016-04-15 LAB — VITAMIN D 25 HYDROXY (VIT D DEFICIENCY, FRACTURES): Vit D, 25-Hydroxy: 57.6 ng/mL (ref 30.0–100.0)

## 2016-04-16 LAB — NMR, LIPOPROFILE
Cholesterol: 117 mg/dL (ref 100–199)
HDL CHOLESTEROL BY NMR: 31 mg/dL — AB (ref >39)
HDL PARTICLE NUMBER: 24.8 umol/L — AB (ref >=30.5)
LDL Particle Number: 993 nmol/L (ref <1000)
LDL Size: 20.1 nm (ref >20.5)
LDL-C: 72 mg/dL (ref 0–99)
LP-IR Score: 62 — ABNORMAL HIGH (ref <=45)
SMALL LDL PARTICLE NUMBER: 650 nmol/L — AB (ref <=527)
TRIGLYCERIDES BY NMR: 70 mg/dL (ref 0–149)

## 2016-05-08 DIAGNOSIS — H25813 Combined forms of age-related cataract, bilateral: Secondary | ICD-10-CM | POA: Diagnosis not present

## 2016-05-08 DIAGNOSIS — Z7984 Long term (current) use of oral hypoglycemic drugs: Secondary | ICD-10-CM | POA: Diagnosis not present

## 2016-05-08 DIAGNOSIS — E119 Type 2 diabetes mellitus without complications: Secondary | ICD-10-CM | POA: Diagnosis not present

## 2016-05-08 DIAGNOSIS — H04123 Dry eye syndrome of bilateral lacrimal glands: Secondary | ICD-10-CM | POA: Diagnosis not present

## 2016-05-08 LAB — HM DIABETES EYE EXAM

## 2016-05-09 DIAGNOSIS — C4441 Basal cell carcinoma of skin of scalp and neck: Secondary | ICD-10-CM | POA: Diagnosis not present

## 2016-05-09 DIAGNOSIS — D485 Neoplasm of uncertain behavior of skin: Secondary | ICD-10-CM | POA: Diagnosis not present

## 2016-05-09 DIAGNOSIS — L570 Actinic keratosis: Secondary | ICD-10-CM | POA: Diagnosis not present

## 2016-05-09 DIAGNOSIS — C44311 Basal cell carcinoma of skin of nose: Secondary | ICD-10-CM | POA: Diagnosis not present

## 2016-05-23 DIAGNOSIS — C4441 Basal cell carcinoma of skin of scalp and neck: Secondary | ICD-10-CM | POA: Diagnosis not present

## 2016-05-30 ENCOUNTER — Other Ambulatory Visit: Payer: Self-pay | Admitting: Family Medicine

## 2016-06-07 ENCOUNTER — Other Ambulatory Visit: Payer: Self-pay | Admitting: Family Medicine

## 2016-06-13 DIAGNOSIS — C4441 Basal cell carcinoma of skin of scalp and neck: Secondary | ICD-10-CM | POA: Diagnosis not present

## 2016-06-13 DIAGNOSIS — Z85828 Personal history of other malignant neoplasm of skin: Secondary | ICD-10-CM | POA: Diagnosis not present

## 2016-06-13 DIAGNOSIS — D485 Neoplasm of uncertain behavior of skin: Secondary | ICD-10-CM | POA: Diagnosis not present

## 2016-06-20 DIAGNOSIS — C4441 Basal cell carcinoma of skin of scalp and neck: Secondary | ICD-10-CM | POA: Diagnosis not present

## 2016-06-27 DIAGNOSIS — R972 Elevated prostate specific antigen [PSA]: Secondary | ICD-10-CM | POA: Diagnosis not present

## 2016-07-03 ENCOUNTER — Other Ambulatory Visit: Payer: Self-pay | Admitting: Family Medicine

## 2016-07-03 DIAGNOSIS — N401 Enlarged prostate with lower urinary tract symptoms: Secondary | ICD-10-CM | POA: Diagnosis not present

## 2016-07-03 DIAGNOSIS — R3915 Urgency of urination: Secondary | ICD-10-CM | POA: Diagnosis not present

## 2016-07-03 DIAGNOSIS — R972 Elevated prostate specific antigen [PSA]: Secondary | ICD-10-CM | POA: Diagnosis not present

## 2016-07-04 ENCOUNTER — Telehealth: Payer: Self-pay | Admitting: *Deleted

## 2016-07-04 MED ORDER — INTERFERON BETA-1B 0.3 MG ~~LOC~~ KIT: 0.2500 mg | PACK | SUBCUTANEOUS | 6 refills | Status: DC

## 2016-07-04 NOTE — Telephone Encounter (Signed)
[employer] escribed to [employer]. per faxed request/fim

## 2016-07-06 ENCOUNTER — Other Ambulatory Visit: Payer: Self-pay | Admitting: Family Medicine

## 2016-08-01 ENCOUNTER — Telehealth: Payer: Self-pay | Admitting: *Deleted

## 2016-08-01 DIAGNOSIS — C44311 Basal cell carcinoma of skin of nose: Secondary | ICD-10-CM | POA: Diagnosis not present

## 2016-08-01 NOTE — Telephone Encounter (Signed)
Application for [employer] Pt. Assistance Program completed and faxed back to [employer] PAP fax# [phone]

## 2016-08-15 ENCOUNTER — Ambulatory Visit (INDEPENDENT_AMBULATORY_CARE_PROVIDER_SITE_OTHER): Payer: PPO | Admitting: Family Medicine

## 2016-08-15 ENCOUNTER — Encounter: Payer: Self-pay | Admitting: Family Medicine

## 2016-08-15 VITALS — BP 107/66 | HR 74 | Temp 97.1°F | Ht 66.0 in | Wt 155.0 lb

## 2016-08-15 DIAGNOSIS — E559 Vitamin D deficiency, unspecified: Secondary | ICD-10-CM

## 2016-08-15 DIAGNOSIS — D519 Vitamin B12 deficiency anemia, unspecified: Secondary | ICD-10-CM

## 2016-08-15 DIAGNOSIS — G35 Multiple sclerosis: Secondary | ICD-10-CM

## 2016-08-15 DIAGNOSIS — E1169 Type 2 diabetes mellitus with other specified complication: Secondary | ICD-10-CM

## 2016-08-15 DIAGNOSIS — E119 Type 2 diabetes mellitus without complications: Secondary | ICD-10-CM | POA: Diagnosis not present

## 2016-08-15 DIAGNOSIS — G35D Multiple sclerosis, unspecified: Secondary | ICD-10-CM

## 2016-08-15 DIAGNOSIS — I700 Atherosclerosis of aorta: Secondary | ICD-10-CM | POA: Diagnosis not present

## 2016-08-15 DIAGNOSIS — I10 Essential (primary) hypertension: Secondary | ICD-10-CM

## 2016-08-15 DIAGNOSIS — I2510 Atherosclerotic heart disease of native coronary artery without angina pectoris: Secondary | ICD-10-CM | POA: Diagnosis not present

## 2016-08-15 DIAGNOSIS — E785 Hyperlipidemia, unspecified: Secondary | ICD-10-CM | POA: Diagnosis not present

## 2016-08-15 LAB — BAYER DCA HB A1C WAIVED: HB A1C: 6.5 % (ref <7.0)

## 2016-08-15 MED ORDER — CYANOCOBALAMIN 1000 MCG/ML IJ SOLN: INTRAMUSCULAR | 3 refills | Status: DC

## 2016-08-15 NOTE — Progress Notes (Signed)
Subjective:    [name], male    DOB: 1951-01-12, 66 y.o.   MRN: [id number]  HPI Pt here for follow up and management of chronic medical problems which includes diabetes, hypertension and hyperlipidemia. He is taking medication regularly.The significant findings with this patient's health are his history of kidney stones, his him as, and his hyperlipidemia along with his diabetes. He denies any specific complaints. He also has B12 deficiency. The patient is seeing the neurologist for his multiple sclerosis. He is seeing the urologist because of his BPH and overactive bladder and elevated PSA. The most recent PSA was improved and lower than the previous one. He is taking medicine for overactive bladder. He had a lot of problems with nausea and upset stomach and had been seeing the gastroenterologist and additional tests were done and no causes were found for this. He is doing better with that currently. He denies any chest pain or shortness of breath anymore than usual. His intestinal tract is working better now other than some mild constipation. He has not seen any blood in the stool. Other than the occasional frequency that he has from his overactive bladder he is not having any other problems with voiding. He says his blood sugars at home have been good. He has recently just had his third basal cell carcinoma removed from his nose.  Patient Active Problem List   Diagnosis Date Noted  . Aortic atherosclerosis ([place]) 04/10/2016  . Rosacea 11/23/2015  . Urinary dysfunction 08/19/2015  . Right foot drop 02/17/2015  . Urinary disorder 02/17/2015  . Insomnia 02/17/2015  . Other fatigue 02/17/2015  . 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 Bladder disorder 09/16/2014  . Multiple sclerosis ([place]) 01/06/2013  . Ureteral calculus, right 10/30/2011  . Bladder calculus 10/30/2011  . Tobacco use 09/27/2011  . CORONARY ATHEROSCLEROSIS, NATIVE VESSEL 10/06/2009  . Diabetes mellitus type 2 in nonobese  ([place]) 10/01/2009  . Hyperlipidemia associated with type 2 diabetes mellitus ([place]) 10/01/2009  . Essential hypertension 10/01/2009   Outpatient Encounter Prescriptions as of 08/15/2016  Medication Sig  . amLODipine (NORVASC) 2.5 MG tablet TAKE ONE TABLET BY MOUTH EVERY MORNING  . aspirin (LONGS ADULT LOW STRENGTH ASA) 81 MG EC tablet Take 81 mg by mouth daily.   . cholecalciferol (VITAMIN D) 1000 UNITS tablet Take 1,000 Units by mouth daily.  . cyanocobalamin (,VITAMIN B-12,) 1000 MCG/ML injection INJECT 1 MILLILITER INTO THE SKIN ONCE AMONTH  . doxycycline (VIBRAMYCIN) 100 MG capsule Take 100 mg by mouth. Take one-half tablet by mouth every other day  . fish oil-omega-3 fatty acids 1000 MG capsule Take 2 g by mouth 2 (two) times daily.  . fluticasone (FLONASE) 50 MCG/ACT nasal spray One to 2 sprays each nostril at bedtime  . glucose blood test strip Use as instructed  . Interferon Beta-1b (BETASERON) 0.3 MG KIT injection Inject 0.25 mg into the skin every other day.  . isosorbide mononitrate (IMDUR) 60 MG 24 hr tablet TAKE 1/2 TABLET BY MOUTH EVERY DAY  . levothyroxine (SYNTHROID) 50 MCG tablet Take 1 tablet (50 mcg total) by mouth daily before breakfast.  . lisinopril (PRINIVIL,ZESTRIL) 10 MG tablet TAKE ONE TABLET BY MOUTH DAILY  . metFORMIN (GLUCOPHAGE) 500 MG tablet TAKE TWO TABLETS BY MOUTH TWICE DAILY WITH MEALS  . mupirocin ointment (BACTROBAN) 2 % Apply 1 application topically 2 (two) times daily.  Marland Kitchen MYRBETRIQ 50 MG TB24 tablet Take 1 tablet by mouth daily.  Marland Kitchen omeprazole (PRILOSEC) 20 MG  capsule Take 20 mg by mouth daily.  . pioglitazone (ACTOS) 30 MG tablet TAKE ONE TABLET BY MOUTH EVERY DAY  . rosuvastatin (CRESTOR) 20 MG tablet Take 1 tablet (20 mg total) by mouth daily. As directed  . Tamsulosin HCl (FLOMAX) 0.4 MG CAPS Take 0.4 mg by mouth daily after supper.   . Tuberculin-Allergy Syringes (ALLERGY SYRINGE 1CC/27GX1/2") 27G X 1/2" 1 ML MISC Injection once monthly- B 12 shots    Facility-Administered Encounter Medications as of 08/15/2016  Medication  . cyanocobalamin ((VITAMIN B-12)) injection 1,000 mcg      Review of Systems  Constitutional: Negative.   HENT: Negative.   Eyes: Negative.   Respiratory: Negative.   Cardiovascular: Negative.   Gastrointestinal: Negative.   Endocrine: Negative.   Genitourinary: Negative.   Musculoskeletal: Negative.   Skin: Negative.   Allergic/Immunologic: Negative.   Neurological: Negative.   Hematological: Negative.   Psychiatric/Behavioral: Negative.        Objective:   Physical Exam  Constitutional: He is oriented to person, place, and time. He appears well-developed and well-nourished. No distress.  The patient is pleasant and alert and somewhat stressed because of the expense of medication has to take for his MS.  HENT:  Head: Normocephalic and atraumatic.  Right Ear: External ear normal.  Left Ear: External ear normal.  Mouth/Throat: Oropharynx is clear and moist. No oropharyngeal exudate.  Slight nasal congestion bilaterally  Eyes: Conjunctivae and EOM are normal. Pupils are equal, round, and reactive to light. Right eye exhibits no discharge. Left eye exhibits no discharge. No scleral icterus.  Neck: Normal range of motion. Neck supple. No thyromegaly present.  No bruits thyromegaly or anterior cervical adenopathy  Cardiovascular: Normal rate, regular rhythm and intact distal pulses.   No murmur heard. The heart has a regular rate and rhythm at 72/m  Pulmonary/Chest: Effort normal and breath sounds normal. No respiratory distress. He has no wheezes. He has no rales. He exhibits no tenderness.  Clear anteriorly and posteriorly and no axillary adenopathy  Abdominal: Soft. Bowel sounds are normal. He exhibits no mass. There is no tenderness. There is no rebound and no guarding.  No abdominal tenderness masses bruits or organ enlargement  Genitourinary:  Genitourinary Comments: Follow-up with urology as  planned  Musculoskeletal: He exhibits no edema.  The patient has weakness in the right lower extremity because of his MS and wears a brace for this. He also uses a cane for walking.  Lymphadenopathy:    He has no cervical adenopathy.  Neurological: He is alert and oriented to person, place, and time. He has normal reflexes. No cranial nerve deficit.  Skin: Skin is warm and dry. No rash noted.  Psychiatric: He has a normal mood and affect. His behavior is normal. Judgment and thought content normal.  Nursing note and vitals reviewed.  BP 107/66 (BP Location: Left Arm)   Pulse 74   Temp 97.1 F (36.2 C) (Oral)   Ht 5' 6" (1.676 m)   Wt 155 lb (70.3 kg)   BMI 25.02 kg/m         Assessment & Plan:  1. Vitamin D deficiency -Continue current treatment pending results of lab work - CBC with Differential/Platelet - VITAMIN D 25 Hydroxy (Vit-D Deficiency, Fractures)  2. Hyperlipidemia associated with type 2 diabetes mellitus ([place]) -Continue as aggressive therapeutic lifestyle changes as possible which include diet and exercise and current treatment pending results of lab work - CBC with Differential/Platelet - NMR, lipoprofile  3. Diabetes  mellitus type 2 in nonobese ([facility]) -Continue with diet and current treatment pending results of lab work - CBC with Differential/Platelet - Bayer [place] Hb A1c Waived  4. Atherosclerosis of native coronary artery of native heart without angina pectoris -Continue with aggressive therapeutic lifestyle changes and current statin drug - CBC with Differential/Platelet - NMR, lipoprofile  5. Essential hypertension -Continue with current treatment as blood pressure is good today. - BMP8+EGFR - CBC with Differential/Platelet - Hepatic function panel  6. Anemia due to vitamin B12 deficiency, unspecified B12 deficiency type - CBC with Differential/Platelet  7. Multiple sclerosis ([place]) -Follow-up with neurology, Dr. Felecia Shelling as recommended every 6  months.  8. Aortic atherosclerosis ([place]) -Continue cholesterol medicine and therapeutic lifestyle changes  Meds ordered this encounter  Medications  . MYRBETRIQ 50 MG TB24 tablet    Sig: Take 1 tablet by mouth daily.  . cyanocobalamin (,VITAMIN B-12,) 1000 MCG/ML injection    Sig: INJECT 1 MILLILITER INTO THE SKIN ONCE A MONTH    Dispense:  3 mL    Refill:  3   Patient Instructions                       Medicare Annual Wellness Visit  [facility] and the medical providers at [facility] strive to bring you the best medical care.  In doing so we not only want to address your current medical conditions and concerns but also to detect new conditions early and prevent illness, disease and health-related problems.    Medicare offers a yearly Wellness Visit which allows our clinical staff to assess your need for preventative services including immunizations, lifestyle education, counseling to decrease risk of preventable diseases and screening for fall risk and other medical concerns.    This visit is provided free of charge (no copay) for all Medicare recipients. The clinical pharmacists at [facility] have begun to conduct these Wellness Visits which will also include a thorough review of all your medications.    As you primary medical provider recommend that you make an appointment for your Annual Wellness Visit if you have not done so already this year.  You may set up this appointment before you leave today or you may call back [phone]) and schedule an appointment.  Please make sure when you call that you mention that you are scheduling your Annual Wellness Visit with the clinical pharmacist so that the appointment may be made for the proper length of time.     Continue current medications. Continue good therapeutic lifestyle changes which include good diet and exercise. Fall precautions discussed with patient. If an FOBT was given today-  please return it to our front desk. If you are over 54 years old - you may need Prevnar 43 or the adult Pneumonia vaccine.  **Flu shots are available--- please call and schedule a FLU-CLINIC appointment**  After your visit with [place] today you will receive a survey in the mail or online from [employer] regarding your care with [place]. Please take a moment to fill this out. Your feedback is very important to [place] as you can help [place] better understand your patient needs as well as improve your experience and satisfaction. WE CARE ABOUT YOU!!!   Follow-up with neurology and urology as planned All up with dermatology first good skin screen and with basal cell cancer removal Return the FOBT We will call with lab work results as soon as those results become available  Arrie Senate MD

## 2016-08-15 NOTE — Patient Instructions (Addendum)
Medicare Annual Wellness Visit  [facility] and the medical providers at [facility] strive to bring you the best medical care.  In doing so we not only want to address your current medical conditions and concerns but also to detect new conditions early and prevent illness, disease and health-related problems.    Medicare offers a yearly Wellness Visit which allows our clinical staff to assess your need for preventative services including immunizations, lifestyle education, counseling to decrease risk of preventable diseases and screening for fall risk and other medical concerns.    This visit is provided free of charge (no copay) for all Medicare recipients. The clinical pharmacists at [facility] have begun to conduct these Wellness Visits which will also include a thorough review of all your medications.    As you primary medical provider recommend that you make an appointment for your Annual Wellness Visit if you have not done so already this year.  You may set up this appointment before you leave today or you may call back [id number] and schedule an appointment.  Please make sure when you call that you mention that you are scheduling your Annual Wellness Visit with the clinical pharmacist so that the appointment may be made for the proper length of time.     Continue current medications. Continue good therapeutic lifestyle changes which include good diet and exercise. Fall precautions discussed with patient. If an FOBT was given today- please return it to our front desk. If you are over 70 years old - you may need Prevnar 31 or the adult Pneumonia vaccine.  **Flu shots are available--- please call and schedule a FLU-CLINIC appointment**  After your visit with [place] today you will receive a survey in the mail or online from [employer] regarding your care with [place]. Please take a moment to fill this out. Your feedback is very  important to [place] as you can help [place] better understand your patient needs as well as improve your experience and satisfaction. WE CARE ABOUT YOU!!!   Follow-up with neurology and urology as planned All up with dermatology first good skin screen and with basal cell cancer removal Return the FOBT We will call with lab work results as soon as those results become available

## 2016-08-16 LAB — NMR, LIPOPROFILE
CHOLESTEROL: 132 mg/dL (ref 100–199)
HDL Cholesterol by NMR: 33 mg/dL — ABNORMAL LOW (ref >39)
HDL PARTICLE NUMBER: 30 umol/L — AB (ref >=30.5)
LDL Particle Number: 1369 nmol/L — ABNORMAL HIGH (ref <1000)
LDL Size: 20.4 nm (ref >20.5)
LDL-C: 84 mg/dL (ref 0–99)
LP-IR Score: 64 — ABNORMAL HIGH (ref <=45)
Small LDL Particle Number: 817 nmol/L — ABNORMAL HIGH (ref <=527)
Triglycerides by NMR: 76 mg/dL (ref 0–149)

## 2016-08-16 LAB — CBC WITH DIFFERENTIAL/PLATELET
BASOS ABS: 0 10*3/uL (ref 0.0–0.2)
Basos: 0 %
EOS (ABSOLUTE): 0.1 10*3/uL (ref 0.0–0.4)
EOS: 1 %
HEMATOCRIT: 35.6 % — AB (ref 37.5–51.0)
Hemoglobin: 11.8 g/dL — ABNORMAL LOW (ref 13.0–17.7)
IMMATURE GRANULOCYTES: 0 %
Immature Grans (Abs): 0 10*3/uL (ref 0.0–0.1)
LYMPHS ABS: 0.8 10*3/uL (ref 0.7–3.1)
LYMPHS: 13 %
MCH: 27.8 pg (ref 26.6–33.0)
MCHC: 33.1 g/dL (ref 31.5–35.7)
MCV: 84 fL (ref 79–97)
Monocytes Absolute: 0.5 10*3/uL (ref 0.1–0.9)
Monocytes: 9 %
NEUTROS PCT: 77 %
Neutrophils Absolute: 4.6 10*3/uL (ref 1.4–7.0)
PLATELETS: 196 10*3/uL (ref 150–379)
RBC: 4.25 x10E6/uL (ref 4.14–5.80)
RDW: 15.2 % (ref 12.3–15.4)
WBC: 6 10*3/uL (ref 3.4–10.8)

## 2016-08-16 LAB — HEPATIC FUNCTION PANEL
ALBUMIN: 4.5 g/dL (ref 3.6–4.8)
ALT: 16 IU/L (ref 0–44)
AST: 17 IU/L (ref 0–40)
Alkaline Phosphatase: 59 IU/L (ref 39–117)
Bilirubin Total: 0.2 mg/dL (ref 0.0–1.2)
Bilirubin, Direct: 0.08 mg/dL (ref 0.00–0.40)
Total Protein: 7.3 g/dL (ref 6.0–8.5)

## 2016-08-16 LAB — BMP8+EGFR
BUN / CREAT RATIO: 26 — AB (ref 10–24)
BUN: 28 mg/dL — AB (ref 8–27)
CALCIUM: 10 mg/dL (ref 8.6–10.2)
CHLORIDE: 99 mmol/L (ref 96–106)
CO2: 22 mmol/L (ref 18–29)
CREATININE: 1.06 mg/dL (ref 0.76–1.27)
GFR calc Af Amer: 84 mL/min/{1.73_m2} (ref >59)
GFR calc non Af Amer: 73 mL/min/{1.73_m2} (ref >59)
Glucose: 123 mg/dL — ABNORMAL HIGH (ref 65–99)
Potassium: 4.7 mmol/L (ref 3.5–5.2)
Sodium: 141 mmol/L (ref 134–144)

## 2016-08-16 LAB — VITAMIN D 25 HYDROXY (VIT D DEFICIENCY, FRACTURES): Vit D, 25-Hydroxy: 90.9 ng/mL (ref 30.0–100.0)

## 2016-08-21 ENCOUNTER — Ambulatory Visit (INDEPENDENT_AMBULATORY_CARE_PROVIDER_SITE_OTHER): Payer: PPO | Admitting: Neurology

## 2016-08-21 ENCOUNTER — Encounter: Payer: Self-pay | Admitting: Neurology

## 2016-08-21 VITALS — BP 152/80 | HR 68 | Resp 16 | Ht 66.0 in | Wt 156.5 lb

## 2016-08-21 DIAGNOSIS — R261 Paralytic gait: Secondary | ICD-10-CM

## 2016-08-21 DIAGNOSIS — G35 Multiple sclerosis: Secondary | ICD-10-CM

## 2016-08-21 DIAGNOSIS — R39198 Other difficulties with micturition: Secondary | ICD-10-CM | POA: Diagnosis not present

## 2016-08-21 DIAGNOSIS — M21371 Foot drop, right foot: Secondary | ICD-10-CM | POA: Diagnosis not present

## 2016-08-21 DIAGNOSIS — R5383 Other fatigue: Secondary | ICD-10-CM

## 2016-08-21 NOTE — Progress Notes (Signed)
[facility]  PATIENT: Jacob Fowler DOB: [birth year]  REFERRING DOCTOR OR PCP:  Redge Gainer SOURCE: patient  _________________________________   HISTORICAL  CHIEF COMPLAINT:  Chief Complaint  Patient presents with  . Multiple Sclerosis    Sts. he has been out of Betaseron for the last 2 weeks.  He has been approved for pt. assistance and should be receiving medication soon.  Denies new or worsening sx./fim    HISTORY OF PRESENT ILLNESS:  Jacob Fowler is a 66 year old man with multiple sclerosis.       MS   He feels stable.  He is on Betaseron and he tolerates it well.   He has not had any definite exacerbations recently.    We discussed other options but he prefers to stay on Betaseron  Gait/strength/sensation: He has poor gait due to right >> left leg weakness and spasticity.   He has a right AFO and it helps his foot drop some. With the cane, he can walk long distances.  He is falling every few weeks when he loses balance.  He loses balance if he takes some steps backwards.      He denies any numbness in the legs. 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   He denies any significant numbness, weakness or clumsiness in the arms.   He denies painful  spasms.     He tried Ampyra in the past but it has not helped.     Bladder: He has urinary frequency,  urgency and urinary hesitancy. Tamsulosin helps.   Myrbetriq helps further but is very expensive.  Oxybutynin helped less.   Now he needs to urinate q 2-3 hours during the day and 0 to 1 at night.  He has not had any incontinence.     Vision: He denies any MS related visual problems. He wears glasses for correction.  Fatigue/sleep: He feels fatigued every day, worse in the afternoons and with heat.Some days he feels very tired but he is very active and is out of the house much of the day.     He feels fatigue is mostly physically.   Ampyra did not help. Amantadine did not help    He sually starts every day feeling well rested.    He is sleeping well most nights.   He does not snore much and his wife has not noted any apnea or gasping.   Mood/cognition:   He denies depression or anxiety. He feels his short-term memory is mildly worse than last year but notes no other cognitive issue.   MS History:   He began to note significant difficulty with gait in January 2010.      He had some back x-rays. He was then referred to neurosurgery that noted that he had plaques in his spine c/w  MS. He had further testing including MRI of the brain and spinal tap. These were all consistent with multiple sclerosis and by November 2010 he had started Betaseron. He feels that he has been mostly stable over the past 5 years but has noted that his gait is a little worse than it was in 2010. About 2013, he started using a cane.  MRI data (Images personally reviewed at previous visit) MRI Images 09/30/2014 were personally reviewed and compared.  I concur with the interpretations: Abnormal MRI cervical spine (with and without) demonstrating: 1. Chronic demyelinating spinal cord plaques at C4-5 on the left and T1 on the  right. 2. No acute plaques. 3. No significant change from MRI on 09/22/10.  Abnormal MRI brain (with and without) demonstrating: 1. Multiple supratentorial chronic demyelinating plaques. Some of these are hypointense on T1 views. 2. No acute plaques. 3. No change from MRI on 09/22/10.    REVIEW OF SYSTEMS: Constitutional: No fevers, chills, sweats, or change in appetite.  He notes fatigue  Eyes: No visual changes, double vision, eye pain Ear, nose and throat: No hearing loss, ear pain, nasal congestion, sore throat Cardiovascular: No chest pain, palpitations Respiratory: No shortness of breath at rest or with exertion.   No wheezes GastrointestinaI: No nausea, vomiting, diarrhea, abdominal pain, fecal incontinence Genitourinary: urinary frequency and hesitancy  .  No nocturia  now Musculoskeletal: No neck pain, back pain Integumentary: No rash, pruritus, skin lesions Neurological: as above Psychiatric: No depression at this time.  No anxiety Endocrine: No palpitations, diaphoresis, change in appetite, change in weigh or increased thirst Hematologic/Lymphatic: No anemia, purpura, petechiae. Allergic/Immunologic: No itchy/runny eyes, nasal congestion, recent allergic reactions, rashes  ALLERGIES: No Known Allergies  HOME MEDICATIONS:  Current Outpatient Prescriptions:  .  amLODipine (NORVASC) 2.5 MG tablet, TAKE ONE TABLET BY MOUTH EVERY MORNING, Disp: 90 tablet, Rfl: 0 .  aspirin (LONGS ADULT LOW STRENGTH ASA) 81 MG EC tablet, Take 81 mg by mouth daily. , Disp: , Rfl:  .  cholecalciferol (VITAMIN D) 1000 UNITS tablet, Take 1,000 Units by mouth daily., Disp: , Rfl:  .  cyanocobalamin (,VITAMIN B-12,) 1000 MCG/ML injection, INJECT 1 MILLILITER INTO THE SKIN ONCE A MONTH, Disp: 3 mL, Rfl: 3 .  doxycycline (VIBRAMYCIN) 100 MG capsule, Take 100 mg by mouth. Take one-half tablet by mouth every other day, Disp: , Rfl:  .  fish oil-omega-3 fatty acids 1000 MG capsule, Take 2 g by mouth 2 (two) times daily., Disp: , Rfl:  .  fluticasone (FLONASE) 50 MCG/ACT nasal spray, One to 2 sprays each nostril at bedtime, Disp: 16 g, Rfl: 6 .  glucose blood test strip, Use as instructed, Disp: 100 each, Rfl: 12 .  isosorbide mononitrate (IMDUR) 60 MG 24 hr tablet, TAKE 1/2 TABLET BY MOUTH EVERY DAY, Disp: 15 tablet, Rfl: 2 .  levothyroxine (SYNTHROID) 50 MCG tablet, Take 1 tablet (50 mcg total) by mouth daily before breakfast., Disp: 90 tablet, Rfl: 2 .  lisinopril (PRINIVIL,ZESTRIL) 10 MG tablet, TAKE ONE TABLET BY MOUTH DAILY, Disp: 90 tablet, Rfl: 3 .  metFORMIN (GLUCOPHAGE) 500 MG tablet, TAKE TWO TABLETS BY MOUTH TWICE DAILY WITH MEALS, Disp: 120 tablet, Rfl: 3 .  omeprazole (PRILOSEC) 20 MG capsule, Take 20 mg by mouth daily., Disp: , Rfl:  .  pioglitazone (ACTOS) 30 MG  tablet, TAKE ONE TABLET BY MOUTH EVERY DAY, Disp: 30 tablet, Rfl: 2 .  rosuvastatin (CRESTOR) 20 MG tablet, Take 1 tablet (20 mg total) by mouth daily. As directed, Disp: 90 tablet, Rfl: 1 .  Tamsulosin HCl (FLOMAX) 0.4 MG CAPS, Take 0.4 mg by mouth daily after supper. , Disp: , Rfl:  .  Tuberculin-Allergy Syringes (ALLERGY SYRINGE 1CC/27GX1/2") 27G X 1/2" 1 ML MISC, Injection once monthly- B 12 shots, Disp: 100 each, Rfl: 1 .  Interferon Beta-1b (BETASERON) 0.3 MG KIT injection, Inject 0.25 mg into the skin every other day. (Patient not taking: Reported on 08/21/2016), Disp: 1 kit, Rfl: 6 .  mupirocin ointment (BACTROBAN) 2 %, Apply 1 application topically 2 (two) times daily. (Patient not taking: Reported on 08/21/2016), Disp: 22 g, Rfl: 1 .  MYRBETRIQ 50 MG TB24 tablet, Take 1 tablet by mouth daily., Disp: , Rfl:   Current Facility-Administered Medications:  .  cyanocobalamin ((VITAMIN B-12)) injection 1,000 mcg, 1,000 mcg, Intramuscular, Q30 days, Chipper Herb, MD, 1,000 mcg at 03/02/15 1647  PAST MEDICAL HISTORY: Past Medical History:  Diagnosis Date  . Acquired leg length discrepancy   . Bladder stone   . BPH associated with nocturia   . CAD (coronary artery disease) CARDIOLOGIST- DR HOCHREIN--  LAST VISIT 09-27-2011 IN [facility]   Non obstructive Cath 2010-  HX CORONARY SPASM 2004  . GERD (gastroesophageal reflux disease)   . H/O hiatal hernia   . History of nephrolithiasis 2009  . Hx of adenomatous colonic polyps   . Hyperlipidemia   . Hypertension   . Movement disorder   . Multiple sclerosis ([facility]) DX  10/10---  NEUROLOGIST  DR Delfino Lovett [facility] ([place])   RIGHT SIDE AFFECTED MORE W/ WEAKNESS- - USES CANE  . Neuromuscular disorder (HCC)    MS  . NIDDM (non-insulin dependent diabetes mellitus)   . Peyronie's disease   . RBBB   . Renal stone RIGHT  . Right ureteral stone   . Rosacea   . Vision abnormalities     PAST SURGICAL HISTORY: Past Surgical History:  Procedure  Laterality Date  . CARDIAC CATHETERIZATION  02-26-2009 ---  DR Ida Rogue   NONOBSTRUCTIVE CAD/ 50% DISTAL RCA/ 30% LAD / NORMAL LVF  . CYSTOSCOPY W/ RETROGRADES  10/30/2011   Procedure: CYSTOSCOPY WITH RETROGRADE PYELOGRAM;  Surgeon: Claybon Jabs, MD;  [facility];  Service: Urology;  Laterality: Right;  . CYSTOSCOPY WITH LITHOLAPAXY  10/30/2011   Procedure: CYSTOSCOPY WITH LITHOLAPAXY;  Surgeon: Claybon Jabs, MD;  Location[facility];  Service: Urology;  Laterality: N/A;  . EXTRACORPOREAL SHOCK WAVE LITHOTRIPSY  09-05-2010   RIGHT  . NESBIT PROCEDURE  10-17-2004   CORRECTION OF PENILE ANGULATION (PEYRONIES DISEASE)    FAMILY HISTORY: Family History  Problem Relation Age of Onset  . Stroke Mother   . Heart attack Mother   . Renal cancer Father     SOCIAL HISTORY:  Social History   Social History  . Marital status: Married    Spouse name: N/A  . Number of children: N/A  . Years of education: N/A   Occupational History  . Not on file.   Social History Main Topics  . Smoking status: Former Smoker    Packs/day: 1.00    Years: 7.00    Types: Cigarettes    Quit date: 09/26/1973  . Smokeless tobacco: Current User    Types: Chew     Comment: CHEW TOBACCO FOR 35 YRS  . Alcohol use No  . Drug use: No  . Sexual activity: Not on file   Other Topics Concern  . Not on file   Social History Narrative  . No narrative on file     PHYSICAL EXAM  Vitals:   08/21/16 0906  BP: (!) 152/80  Pulse: 68  Resp: 16  Weight: 156 lb 8 oz (71 kg)  Height: 5' 6"  (1.676 m)    Body mass index is 25.26 kg/m.   General: The patient is well-developed and well-nourished and in no acute distress  Neurologic Exam  Mental status: The patient is alert and oriented x 3 at the time of the examination. The patient has apparent normal recent and remote memory, with an apparently normal attention span and concentration ability.  Speech is  normal.  Cranial nerves: Extraocular movements are full.   There is good facial sensation to soft touch bilaterally.Facial strength is normal.  Trapezius and sternocleidomastoid strength is normal. No dysarthria is noted.  The tongue is midline, and the patient has symmetric elevation of the soft palate. No obvious hearing deficits are noted.  Motor:  Muscle bulk is normal.   Tone is increased in legs, right greater than left. Strength is 2 / 5 hip flexors and 3 quads and 4-/5 distally in the right leg and 4/5 in the left leg. Strength is  5 / 5 in arms.     Sensory: Sensory testing is intact to touch and vibration sensation in all 4 extremities.  Coordination: Cerebellar testing reveals good finger-nose-finger .    Gait and station: Station is normal.   Gait is spastic.  He has a right foot drop but both legs seem weak. The knee hyperextends a little bit on the right as he walks. He is unable to tandem gait.     Romberg is negative  Reflexes: Deep tendon reflexes are normal in arms and increased in his legs with spread at the right knee. He has 2 beats of nonsustained clonus on the right ankle.Marland Kitchen      DIAGNOSTIC DATA (LABS, IMAGING, TESTING) - I reviewed patient records, labs, notes, testing and imaging myself where available.  Lab Results  Component Value Date   WBC 6.0 08/15/2016   HGB 11.3 (A) 02/19/2015   HCT 35.6 (L) 08/15/2016   MCV 84 08/15/2016   PLT 196 08/15/2016      Component Value Date/Time   NA 141 08/15/2016 1007   K 4.7 08/15/2016 1007   CL 99 08/15/2016 1007   CO2 22 08/15/2016 1007   GLUCOSE 123 (H) 08/15/2016 1007   GLUCOSE 130 (H) 01/06/2013 1311   BUN 28 (H) 08/15/2016 1007   CREATININE 1.06 08/15/2016 1007   CREATININE 1.13 01/06/2013 1311   CALCIUM 10.0 08/15/2016 1007   PROT 7.3 08/15/2016 1007   ALBUMIN 4.5 08/15/2016 1007   AST 17 08/15/2016 1007   ALT 16 08/15/2016 1007   ALKPHOS 59 08/15/2016 1007   BILITOT 0.2 08/15/2016 1007   GFRNONAA 73  08/15/2016 1007   GFRNONAA 69 01/06/2013 1311   GFRAA 84 08/15/2016 1007   GFRAA 80 01/06/2013 1311   Lab Results  Component Value Date   CHOL 132 08/15/2016   HDL 33 (L) 08/15/2016   LDLCALC 77 01/28/2014   TRIG 76 08/15/2016   CHOLHDL 6.8 (H) 05/14/2013   Lab Results  Component Value Date   HGBA1C 6.9 07/05/2015        ASSESSMENT AND PLAN  Multiple sclerosis (HCC)  Spastic gait  Right foot drop  Other fatigue  Urinary dysfunction   1.   Continue Betaseron.  We will check an MRI of the brain and an MRI of the cervical spine to make sure that there has not been subclinical progression. If this is occurring, we need to switch to a more efficacious medication and I would consider ocrelizumab, Tysabri or Gilenya. 2.  Continue tamsulosin for bladder 3.   He is active and falls a lot.    I recommended a walker for safety.  He prefers using his cane but does use the walker at times 4.  He will return to see me in 6 months or sooner if he has new or worsening neurologic symptoms.   Richard A. Felecia Shelling, MD, PhD 08/21/2016, 9:35  AM Certified in Neurology, Clinical Neurophysiology, Sleep Medicine, Pain Medicine and Neuroimaging  [facility] [address], [place], [place] [address] [phone]

## 2016-09-04 ENCOUNTER — Ambulatory Visit
Admission: RE | Admit: 2016-09-04 | Discharge: 2016-09-04 | Disposition: A | Discharge status: Home / Self Care | Payer: PPO | Source: Ambulatory Visit | Attending: Neurology | Admitting: Neurology

## 2016-09-04 DIAGNOSIS — R261 Paralytic gait: Secondary | ICD-10-CM

## 2016-09-04 DIAGNOSIS — M50221 Other cervical disc displacement at C4-C5 level: Secondary | ICD-10-CM | POA: Diagnosis not present

## 2016-09-04 DIAGNOSIS — G35 Multiple sclerosis: Secondary | ICD-10-CM | POA: Diagnosis not present

## 2016-09-04 DIAGNOSIS — M50222 Other cervical disc displacement at C5-C6 level: Secondary | ICD-10-CM | POA: Diagnosis not present

## 2016-09-04 DIAGNOSIS — G35D Multiple sclerosis, unspecified: Secondary | ICD-10-CM

## 2016-09-04 MED ORDER — GADOBENATE DIMEGLUMINE 529 MG/ML IV SOLN
14.0000 mL | Freq: Once | INTRAVENOUS | Status: AC | PRN
  Administered 2016-09-04: 14 mL via INTRAVENOUS

## 2016-09-05 ENCOUNTER — Telehealth: Payer: Self-pay | Admitting: *Deleted

## 2016-09-05 NOTE — Telephone Encounter (Signed)
-----   Message from Britt Bottom, MD sent at 09/05/2016  8:54 AM EST ----- Please let him know that the MRI of the brain and the cervical spine show that there are no new lesions.

## 2016-09-05 NOTE — Telephone Encounter (Signed)
LMOM that per RAS, MRI brain, c-spine show no new MS lesions.  He does not need to return this call unless he has questions/fim

## 2016-09-06 ENCOUNTER — Other Ambulatory Visit: Payer: Self-pay | Admitting: Family Medicine

## 2016-09-12 DIAGNOSIS — Z85828 Personal history of other malignant neoplasm of skin: Secondary | ICD-10-CM | POA: Diagnosis not present

## 2016-09-12 DIAGNOSIS — D1801 Hemangioma of skin and subcutaneous tissue: Secondary | ICD-10-CM | POA: Diagnosis not present

## 2016-09-12 DIAGNOSIS — L821 Other seborrheic keratosis: Secondary | ICD-10-CM | POA: Diagnosis not present

## 2016-09-12 DIAGNOSIS — L814 Other melanin hyperpigmentation: Secondary | ICD-10-CM | POA: Diagnosis not present

## 2016-09-12 DIAGNOSIS — D225 Melanocytic nevi of trunk: Secondary | ICD-10-CM | POA: Diagnosis not present

## 2016-10-05 ENCOUNTER — Other Ambulatory Visit: Payer: Self-pay | Admitting: Family Medicine

## 2016-10-09 ENCOUNTER — Other Ambulatory Visit: Payer: Self-pay | Admitting: Family Medicine

## 2016-10-25 ENCOUNTER — Other Ambulatory Visit: Payer: Self-pay | Admitting: Family Medicine

## 2016-10-25 ENCOUNTER — Other Ambulatory Visit: Payer: Self-pay | Admitting: *Deleted

## 2016-11-14 ENCOUNTER — Other Ambulatory Visit: Payer: Self-pay | Admitting: Family Medicine

## 2017-01-02 ENCOUNTER — Encounter: Payer: Self-pay | Admitting: Family Medicine

## 2017-01-02 ENCOUNTER — Ambulatory Visit (INDEPENDENT_AMBULATORY_CARE_PROVIDER_SITE_OTHER): Payer: PPO | Admitting: Family Medicine

## 2017-01-02 VITALS — BP 97/59 | HR 74 | Temp 96.9°F | Ht 66.0 in | Wt 150.0 lb

## 2017-01-02 DIAGNOSIS — D519 Vitamin B12 deficiency anemia, unspecified: Secondary | ICD-10-CM

## 2017-01-02 DIAGNOSIS — E559 Vitamin D deficiency, unspecified: Secondary | ICD-10-CM

## 2017-01-02 DIAGNOSIS — I2510 Atherosclerotic heart disease of native coronary artery without angina pectoris: Secondary | ICD-10-CM

## 2017-01-02 DIAGNOSIS — E119 Type 2 diabetes mellitus without complications: Secondary | ICD-10-CM

## 2017-01-02 DIAGNOSIS — R634 Abnormal weight loss: Secondary | ICD-10-CM

## 2017-01-02 DIAGNOSIS — I10 Essential (primary) hypertension: Secondary | ICD-10-CM | POA: Diagnosis not present

## 2017-01-02 DIAGNOSIS — I700 Atherosclerosis of aorta: Secondary | ICD-10-CM

## 2017-01-02 DIAGNOSIS — E785 Hyperlipidemia, unspecified: Secondary | ICD-10-CM | POA: Diagnosis not present

## 2017-01-02 DIAGNOSIS — D692 Other nonthrombocytopenic purpura: Secondary | ICD-10-CM

## 2017-01-02 DIAGNOSIS — G35D Multiple sclerosis, unspecified: Secondary | ICD-10-CM

## 2017-01-02 DIAGNOSIS — E1169 Type 2 diabetes mellitus with other specified complication: Secondary | ICD-10-CM

## 2017-01-02 DIAGNOSIS — G35 Multiple sclerosis: Secondary | ICD-10-CM | POA: Diagnosis not present

## 2017-01-02 LAB — BAYER DCA HB A1C WAIVED: HB A1C (BAYER DCA - WAIVED): 6.6 % (ref <7.0)

## 2017-01-02 NOTE — Progress Notes (Addendum)
Subjective:    [name], male    DOB: 1951/02/17, 66 y.o.   MRN: [id number]  HPI Pt here for follow up and management of chronic medical problems which includes hyperlipidemia and diabetes. He is taking medication regularly.The patient is doing well overall today. He is seeing the urologist because of his prostate. He continues to see the neurologist because of his MS. He has diabetes hypertension and hyperlipidemia. He also has B12 deficiency. The neurologist that he sees is Dr. Mariea Stable. On his vital signs his weight is down 7 pounds since the last visit. The patient denies any chest pain or shortness of breath. He has weakness but no shortness of breath. He has occasional nausea and he continues to take doxycycline. I reminded him that for rosacea he should could and should get by with 50 mg a day versus 100 mg a day. He denies any vomiting diarrhea or blood in the stool. His last colonoscopy was in 2014 he did have some colon polyps. Dr. Watt Climes did this. He has not seen any blood in the stool. He sees the urologist on a yearly basis. He sees the ophthalmologist yearly and the fall and was told that he has cataracts and they're getting close to being needed to have surgery. The patient does not check his blood sugars at home because they're always good.    Patient Active Problem List   Diagnosis Date Noted  . Aortic atherosclerosis ([place]) 04/10/2016  . Rosacea 11/23/2015  . Urinary dysfunction 08/19/2015  . Right foot drop 02/17/2015  . Urinary disorder 02/17/2015  . Insomnia 02/17/2015  . Other fatigue 02/17/2015  . 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 Bladder disorder 09/16/2014  . Multiple sclerosis ([place]) 01/06/2013  . Ureteral calculus, right 10/30/2011  . Bladder calculus 10/30/2011  . Tobacco use 09/27/2011  . CORONARY ATHEROSCLEROSIS, NATIVE VESSEL 10/06/2009  . Diabetes mellitus type 2 in nonobese ([place]) 10/01/2009  . Hyperlipidemia associated with type 2 diabetes mellitus  ([place]) 10/01/2009  . Essential hypertension 10/01/2009   Outpatient Encounter Prescriptions as of 01/02/2017  Medication Sig  . amLODipine (NORVASC) 2.5 MG tablet TAKE ONE TABLET BY MOUTH EVERY MORNING  . aspirin (LONGS ADULT LOW STRENGTH ASA) 81 MG EC tablet Take 81 mg by mouth daily.   . cholecalciferol (VITAMIN D) 1000 UNITS tablet Take 1,000 Units by mouth daily.  . cyanocobalamin (,VITAMIN B-12,) 1000 MCG/ML injection INJECT 1 MILLILITER INTO THE SKIN ONCE A MONTH  . doxycycline (VIBRAMYCIN) 100 MG capsule Take 100 mg by mouth. Take one-half tablet by mouth every other day  . fish oil-omega-3 fatty acids 1000 MG capsule Take 2 g by mouth 2 (two) times daily.  . fluticasone (FLONASE) 50 MCG/ACT nasal spray One to 2 sprays each nostril at bedtime  . glucose blood test strip Use as instructed  . Interferon Beta-1b (BETASERON) 0.3 MG KIT injection Inject 0.25 mg into the skin every other day.  . isosorbide mononitrate (IMDUR) 60 MG 24 hr tablet TAKE 1/2 TABLET BY MOUTH EVERY DAY  . levothyroxine (SYNTHROID, LEVOTHROID) 50 MCG tablet TAKE ONE TABLET BY MOUTH EVERY DAY BEFORE BREAKFAST  . lisinopril (PRINIVIL,ZESTRIL) 10 MG tablet TAKE ONE TABLET BY MOUTH DAILY  . metFORMIN (GLUCOPHAGE) 500 MG tablet TAKE TWO TABLETS BY MOUTH TWICE DAILY WITH MEALS  . mupirocin ointment (BACTROBAN) 2 % Apply 1 application topically 2 (two) times daily.  [place] Kitchen MYRBETRIQ 50 MG TB24 tablet Take 1 tablet by mouth daily.  [place] Kitchen omeprazole (PRILOSEC)  20 MG capsule Take 20 mg by mouth daily.  . pioglitazone (ACTOS) 30 MG tablet TAKE ONE TABLET BY MOUTH EVERY DAY  . rosuvastatin (CRESTOR) 20 MG tablet TAKE ONE TABLET BY MOUTH EVERY DAY  . Tamsulosin HCl (FLOMAX) 0.4 MG CAPS Take 0.4 mg by mouth daily after supper.   . Tuberculin-Allergy Syringes (ALLERGY SYRINGE 1CC/27GX1/2") 27G X 1/2" 1 ML MISC Injection once monthly- B 12 shots  . [DISCONTINUED] doxycycline (VIBRA-TABS) 100 MG tablet TAKE ONE TABLET BY MOUTH EVERY DAY    Facility-Administered Encounter Medications as of 01/02/2017  Medication  . cyanocobalamin ((VITAMIN B-12)) injection 1,000 mcg      Review of Systems  Constitutional: Negative.   HENT: Negative.   Eyes: Negative.   Respiratory: Negative.   Cardiovascular: Negative.   Gastrointestinal: Negative.   Endocrine: Negative.   Genitourinary: Negative.   Musculoskeletal: Negative.   Skin: Negative.   Allergic/Immunologic: Negative.   Neurological: Negative.   Hematological: Negative.   Psychiatric/Behavioral: Negative.        Objective:   Physical Exam  Constitutional: He is oriented to person, place, and time. He appears well-developed and well-nourished. No distress.  The patient is pleasant and alert as usual  HENT:  Head: Normocephalic and atraumatic.  Right Ear: External ear normal.  Left Ear: External ear normal.  Mouth/Throat: Oropharynx is clear and moist. No oropharyngeal exudate.  Slight nasal congestion  Eyes: Conjunctivae and EOM are normal. Pupils are equal, round, and reactive to light. Right eye exhibits no discharge. Left eye exhibits no discharge. No scleral icterus.  The patient has been told by his ophthalmologist that he has cataracts and is close to needing surgery for these.  Neck: Normal range of motion. Neck supple. No thyromegaly present.  No bruits thyromegaly or anterior cervical adenopathy  Cardiovascular: Normal rate, regular rhythm, normal heart sounds and intact distal pulses.   No murmur heard. The heart is regular at 72/m  Pulmonary/Chest: Effort normal and breath sounds normal. No respiratory distress. He has no wheezes. He has no rales. He exhibits no tenderness.  Clear anteriorly and posteriorly no axillary adenopathy  Abdominal: Soft. Bowel sounds are normal. He exhibits no mass. There is no tenderness. There is no rebound and no guarding.  No abdominal tenderness masses or bruits or organ enlargement  Genitourinary:  Genitourinary  Comments: This is done yearly by the urologist  Musculoskeletal: He exhibits deformity. He exhibits no edema or tenderness.  The patient has rigidity and stiffness in the right leg secondary to MS and wears a brace on this leg.  Lymphadenopathy:    He has no cervical adenopathy.  Neurological: He is alert and oriented to person, place, and time. He has normal reflexes. No cranial nerve deficit.  The reflexes were slightly more active on the right lower extremity than the left  Skin: Skin is warm and dry. No rash noted.  There is some bruising on both arms and the patient says that he gets these very easily  Psychiatric: He has a normal mood and affect. His behavior is normal. Judgment and thought content normal.  Nursing note and vitals reviewed.   BP (!) 97/59 (BP Location: Left Arm)   Pulse 74   Temp (!) 96.9 F (36.1 C) (Oral)   Ht 5' 6"  (1.676 m)   Wt 150 lb (68 kg)   BMI 24.21 kg/m        Assessment & Plan:  1. Hyperlipidemia associated with type 2 diabetes mellitus ([place]) -Continue  with current treatment and aggressive therapeutic lifestyle changes pending results of lab work - CBC with Differential/Platelet - Lipid panel  2. Diabetes mellitus type 2 in nonobese [facility]) -The patient will try to check some more blood sugars at home and we'll continue to follow-up aggressive therapeutic lifestyle changes - CBC with Differential/Platelet - Bayer DCA Hb A1c Waived - Microalbumin / creatinine urine ratio  3. Vitamin D deficiency -Continue current treatment pending results of lab work - CBC with Differential/Platelet - VITAMIN D 25 Hydroxy (Vit-D Deficiency, Fractures)  4. Essential hypertension -The blood pressure is actually a little bit on the low side today and he is encouraged to drink plenty of fluids and monitor blood pressure periodically. - BMP8+EGFR - CBC with Differential/Platelet - Hepatic function panel  5. Atherosclerosis of native coronary artery of native  heart without angina pectoris -Continue with aggressive therapeutic lifestyle changes with diet and exercise and Crestor. - CBC with Differential/Platelet - Lipid panel  6. Anemia due to vitamin B12 deficiency, unspecified B12 deficiency type -Continue B12 therapy - CBC with Differential/Platelet  7. Multiple sclerosis ([place]) -Continue to follow-up with neurology - CBC with Differential/Platelet  8. Aortic atherosclerosis ([facility]) -Aggressive therapeutic lifestyle changes with  diet and statin  9. Weight loss -Monitor weight loss more closely.  10. Senile purpura  Patient Instructions                       Medicare Annual Wellness Visit  [place] and the medical providers at [facility] strive to bring you the best medical care.  In doing so we not only want to address your current medical conditions and concerns but also to detect new conditions early and prevent illness, disease and health-related problems.    Medicare offers a yearly Wellness Visit which allows our clinical staff to assess your need for preventative services including immunizations, lifestyle education, counseling to decrease risk of preventable diseases and screening for fall risk and other medical concerns.    This visit is provided free of charge (no copay) for all Medicare recipients. The clinical pharmacists at [facility] have begun to conduct these Wellness Visits which will also include a thorough review of all your medications.    As you primary medical provider recommend that you make an appointment for your Annual Wellness Visit if you have not done so already this year.  You may set up this appointment before you leave today or you may call back ([phone]) and schedule an appointment.  Please make sure when you call that you mention that you are scheduling your Annual Wellness Visit with the clinical pharmacist so that the appointment may be made for the proper  length of time.     Continue current medications. Continue good therapeutic lifestyle changes which include good diet and exercise. Fall precautions discussed with patient. If an FOBT was given today- please return it to our front desk. If you are over 76 years old - you may need Prevnar 86 or the adult Pneumonia vaccine.  **Flu shots are available--- please call and schedule a FLU-CLINIC appointment**  After your visit with [place] today you will receive a survey in the mail or online from [employer] regarding your care with [place]. Please take a moment to fill this out. Your feedback is very important to [place] as you can help [place] better understand your patient needs as well as improve your experience and satisfaction. WE CARE ABOUT YOU!!!  Continue to follow-up with urology and neurology. Drink plenty of fluids and stay well hydrated Check weights more regularly Try to check a few blood sugars every now and then    Arrie Senate MD

## 2017-01-02 NOTE — Patient Instructions (Addendum)
Medicare Annual Wellness Visit  [facility] and the medical providers at [facility] strive to bring you the best medical care.  In doing so we not only want to address your current medical conditions and concerns but also to detect new conditions early and prevent illness, disease and health-related problems.    Medicare offers a yearly Wellness Visit which allows our clinical staff to assess your need for preventative services including immunizations, lifestyle education, counseling to decrease risk of preventable diseases and screening for fall risk and other medical concerns.    This visit is provided free of charge (no copay) for all Medicare recipients. The clinical pharmacists at [facility] have begun to conduct these Wellness Visits which will also include a thorough review of all your medications.    As you primary medical provider recommend that you make an appointment for your Annual Wellness Visit if you have not done so already this year.  You may set up this appointment before you leave today or you may call back ([phone]) and schedule an appointment.  Please make sure when you call that you mention that you are scheduling your Annual Wellness Visit with the clinical pharmacist so that the appointment may be made for the proper length of time.     Continue current medications. Continue good therapeutic lifestyle changes which include good diet and exercise. Fall precautions discussed with patient. If an FOBT was given today- please return it to our front desk. If you are over 28 years old - you may need Prevnar 57 or the adult Pneumonia vaccine.  **Flu shots are available--- please call and schedule a FLU-CLINIC appointment**  After your visit with [place] today you will receive a survey in the mail or online from [employer] regarding your care with [place]. Please take a moment to fill this out. Your feedback is very  important to [place] as you can help [place] better understand your patient needs as well as improve your experience and satisfaction. WE CARE ABOUT YOU!!!   Continue to follow-up with urology and neurology. Drink plenty of fluids and stay well hydrated Check weights more regularly Try to check a few blood sugars every now and then

## 2017-01-03 LAB — BMP8+EGFR
BUN/Creatinine Ratio: 20 (ref 10–24)
BUN: 22 mg/dL (ref 8–27)
CALCIUM: 10 mg/dL (ref 8.6–10.2)
CO2: 23 mmol/L (ref 20–29)
Chloride: 104 mmol/L (ref 96–106)
Creatinine, Ser: 1.08 mg/dL (ref 0.76–1.27)
GFR, EST AFRICAN AMERICAN: 82 mL/min/{1.73_m2} (ref >59)
GFR, EST NON AFRICAN AMERICAN: 71 mL/min/{1.73_m2} (ref >59)
Glucose: 127 mg/dL — ABNORMAL HIGH (ref 65–99)
Potassium: 4 mmol/L (ref 3.5–5.2)
Sodium: 143 mmol/L (ref 134–144)

## 2017-01-03 LAB — CBC WITH DIFFERENTIAL/PLATELET
BASOS: 0 %
Basophils Absolute: 0 10*3/uL (ref 0.0–0.2)
EOS (ABSOLUTE): 0 10*3/uL (ref 0.0–0.4)
EOS: 1 %
HEMOGLOBIN: 11.6 g/dL — AB (ref 13.0–17.7)
Hematocrit: 35.6 % — ABNORMAL LOW (ref 37.5–51.0)
IMMATURE GRANS (ABS): 0 10*3/uL (ref 0.0–0.1)
Immature Granulocytes: 0 %
LYMPHS: 11 %
Lymphocytes Absolute: 0.7 10*3/uL (ref 0.7–3.1)
MCH: 28.2 pg (ref 26.6–33.0)
MCHC: 32.6 g/dL (ref 31.5–35.7)
MCV: 87 fL (ref 79–97)
Monocytes Absolute: 0.4 10*3/uL (ref 0.1–0.9)
Monocytes: 6 %
NEUTROS ABS: 5.5 10*3/uL (ref 1.4–7.0)
Neutrophils: 82 %
Platelets: 172 10*3/uL (ref 150–379)
RBC: 4.11 x10E6/uL — ABNORMAL LOW (ref 4.14–5.80)
RDW: 16.1 % — ABNORMAL HIGH (ref 12.3–15.4)
WBC: 6.7 10*3/uL (ref 3.4–10.8)

## 2017-01-03 LAB — VITAMIN D 25 HYDROXY (VIT D DEFICIENCY, FRACTURES): Vit D, 25-Hydroxy: 54.8 ng/mL (ref 30.0–100.0)

## 2017-01-03 LAB — LIPID PANEL
CHOL/HDL RATIO: 3.7 ratio (ref 0.0–5.0)
Cholesterol, Total: 116 mg/dL (ref 100–199)
HDL: 31 mg/dL — AB (ref >39)
LDL CALC: 68 mg/dL (ref 0–99)
Triglycerides: 84 mg/dL (ref 0–149)
VLDL CHOLESTEROL CAL: 17 mg/dL (ref 5–40)

## 2017-01-03 LAB — HEPATIC FUNCTION PANEL
ALK PHOS: 66 IU/L (ref 39–117)
ALT: 19 IU/L (ref 0–44)
AST: 21 IU/L (ref 0–40)
Albumin: 4.6 g/dL (ref 3.6–4.8)
Bilirubin Total: 0.3 mg/dL (ref 0.0–1.2)
Bilirubin, Direct: 0.1 mg/dL (ref 0.00–0.40)
TOTAL PROTEIN: 7.5 g/dL (ref 6.0–8.5)

## 2017-01-03 LAB — MICROALBUMIN / CREATININE URINE RATIO
CREATININE, UR: 198.6 mg/dL
Microalb/Creat Ratio: 46.2 mg/g creat — ABNORMAL HIGH (ref 0.0–30.0)
Microalbumin, Urine: 91.7 ug/mL

## 2017-01-05 ENCOUNTER — Other Ambulatory Visit: Payer: PPO

## 2017-01-05 DIAGNOSIS — Z1211 Encounter for screening for malignant neoplasm of colon: Secondary | ICD-10-CM | POA: Diagnosis not present

## 2017-01-07 LAB — FECAL OCCULT BLOOD, IMMUNOCHEMICAL: FECAL OCCULT BLD: NEGATIVE

## 2017-01-18 ENCOUNTER — Other Ambulatory Visit: Payer: Self-pay | Admitting: Family Medicine

## 2017-01-25 ENCOUNTER — Other Ambulatory Visit: Payer: Self-pay | Admitting: Family Medicine

## 2017-01-26 NOTE — Telephone Encounter (Signed)
We do not do refills on antibiotics- will need to be sen for doxycycline refill

## 2017-01-26 NOTE — Telephone Encounter (Signed)
Last seen 01/02/17  DWM

## 2017-01-29 ENCOUNTER — Other Ambulatory Visit: Payer: Self-pay | Admitting: Family Medicine

## 2017-02-06 ENCOUNTER — Other Ambulatory Visit: Payer: Self-pay | Admitting: Family Medicine

## 2017-02-19 ENCOUNTER — Encounter (INDEPENDENT_AMBULATORY_CARE_PROVIDER_SITE_OTHER): Payer: Self-pay

## 2017-02-19 ENCOUNTER — Ambulatory Visit (INDEPENDENT_AMBULATORY_CARE_PROVIDER_SITE_OTHER): Payer: PPO | Admitting: Neurology

## 2017-02-19 ENCOUNTER — Encounter: Payer: Self-pay | Admitting: Neurology

## 2017-02-19 VITALS — BP 106/65 | HR 70 | Ht 66.0 in | Wt 157.0 lb

## 2017-02-19 DIAGNOSIS — R261 Paralytic gait: Secondary | ICD-10-CM | POA: Diagnosis not present

## 2017-02-19 DIAGNOSIS — M21371 Foot drop, right foot: Secondary | ICD-10-CM

## 2017-02-19 DIAGNOSIS — G35 Multiple sclerosis: Secondary | ICD-10-CM | POA: Diagnosis not present

## 2017-02-19 DIAGNOSIS — R5383 Other fatigue: Secondary | ICD-10-CM

## 2017-02-19 DIAGNOSIS — N329 Bladder disorder, unspecified: Secondary | ICD-10-CM | POA: Diagnosis not present

## 2017-02-19 NOTE — Progress Notes (Signed)
[facility]  PATIENT: Jacob Fowler DOB: 1950/07/18  REFERRING DOCTOR OR PCP:  Redge Gainer SOURCE: patient  _________________________________   HISTORICAL  CHIEF COMPLAINT:  Chief Complaint  Patient presents with  . Follow-up    MS, betaseron going well, denies S/E from betaseron    HISTORY OF PRESENT ILLNESS:  Jacob Fowler is a 66 year old man with multiple sclerosis.    He denies any major changes though does feel his gait has worsened.   Since the last visit he has had his MRIs and blood work.   MRI's 09/04/16 were unchanged form 2016.   He has a plaque to the left at C4-C5 and to the right at T1, unchanged. He also has plaques in the brain consistent with MS.   Two are located in the brainstem.  CBC/LFTs were fine 01/02/17.   MS   He feels stable.  He is on Betaseron and he tolerates it well. We have discussed ocrelizumab as it can work for relapsing remitting as well as primary progressive MS which appears to be more consistent with his disease course  Gait/strength/sensation: He has poor gait due to right >> left leg weakness and spasticity.   He has a right AFO and it helps his foot drop some. With the cane, he can walk long distances. He does not like the walker.   He falls 1-2 times a month.  He loses balance if he takes some steps backwards.     He denies any numbness in the legs     He denies any significant numbness, weakness or clumsiness in the arms.   He he notes some spasticity in the right leg but he denies denies painful  spasms.     He tried Ampyra in the past but it has not helped.     Bladder: He has urinary frequency,  urgency and urinary hesitancy. Tamsulosin helps.  Oxybutynin did not help much and Myrbetriq was expensive  Now he needs to urinate q 2-3 hours during the day and 0 to 1 at night.  He has not had any incontinence.     Vision: He denies any MS related visual problems. He wears glasses for correction.  Fatigue/sleep: He is  active and gets outside every day. 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   His fatigue is more physical and cognitive and is present as the day goes on and with heat  Ampyra did not help. Amantadine did not help   He sually starts every day feeling well rested.    He is sleeping well most nights.   He does not snore much and his wife has not noted any apnea or gasping.   Mood/cognition:   He denies depression or anxiety. He feels his short-term memory is mildly worse than last year but notes no other cognitive issue.   MS History:   He began to note significant difficulty with gait in January 2010.      He had some back x-rays. He was then referred to neurosurgery that noted that he had plaques in his spine c/w  MS. He had further testing including MRI of the brain and spinal tap. These were all consistent with multiple sclerosis and by November 2010 he had started Betaseron. He feels that he has been mostly stable over the past 5 years but has noted that his gait is a little worse than it was in 2010. About 2013, he started using a  cane.  MRI data (Images personally reviewed at previous visit) MRI Images  09/04/16 were personally reviewed and compared.  I concur with the interpretations: IMPRESSION:  This MRI of the cervical spine with and without contrast shows the following: 1.    T2 hyperintense lesions within the spinal cord to the left adjacent to C4-C5 and to the right adjacent to T1, unchanged in appearance when compared to the previous MRI.   2.    Mild degenerative changes at C3-C4, C4-C5 and C6-C7 that did not lead to any nerve root compression. 3.    There is a normal enhancement pattern and there are no acute findings.   IMPRESSION:  This MRI of the brain with and without contrast shows the following: 1.    Multiple T2/FLAIR hyperintense foci in the periventricular, juxtacortical and deep white matter of the hemispheres and 2 foci in the  brainstem in a pattern and configuration consistent with chronic demyelinating plaque associated with multiple sclerosis. None of the foci appears to be acute. When compared to the MRI dated 09/28/2014, there is no interval change. 2.   Mild to moderate cortical atrophy that has mildly progressed when compared to the previous MRI. 3.    There are no acute findings and there is a normal enhancement pattern.  REVIEW OF SYSTEMS: Constitutional: No fevers, chills, sweats, or change in appetite.  He notes fatigue  Eyes: No visual changes, double vision, eye pain Ear, nose and throat: No hearing loss, ear pain, nasal congestion, sore throat Cardiovascular: No chest pain, palpitations Respiratory: No shortness of breath at rest or with exertion.   No wheezes GastrointestinaI: No nausea, vomiting, diarrhea, abdominal pain, fecal incontinence Genitourinary: urinary frequency and hesitancy  .  No nocturia now Musculoskeletal: No neck pain, back pain Integumentary: No rash, pruritus, skin lesions Neurological: as above Psychiatric: No depression at this time.  No anxiety Endocrine: No palpitations, diaphoresis, change in appetite, change in weigh or increased thirst Hematologic/Lymphatic: No anemia, purpura, petechiae. Allergic/Immunologic: No itchy/runny eyes, nasal congestion, recent allergic reactions, rashes  ALLERGIES: No Known Allergies  HOME MEDICATIONS:  Current Outpatient Prescriptions:  .  amLODipine (NORVASC) 2.5 MG tablet, TAKE ONE TABLET BY MOUTH EVERY MORNING, Disp: 90 tablet, Rfl: 1 .  aspirin (LONGS ADULT LOW STRENGTH ASA) 81 MG EC tablet, Take 81 mg by mouth daily. , Disp: , Rfl:  .  cholecalciferol (VITAMIN D) 1000 UNITS tablet, Take 1,000 Units by mouth daily., Disp: , Rfl:  .  cyanocobalamin (,VITAMIN B-12,) 1000 MCG/ML injection, INJECT 1 MILLILITER INTO THE SKIN ONCE A MONTH, Disp: 3 mL, Rfl: 3 .  doxycycline (VIBRA-TABS) 100 MG tablet, TAKE ONE TABLET BY MOUTH EVERY DAY,  Disp: 42 tablet, Rfl: 1 .  fish oil-omega-3 fatty acids 1000 MG capsule, Take 2 g by mouth 2 (two) times daily., Disp: , Rfl:  .  fluticasone (FLONASE) 50 MCG/ACT nasal spray, One to 2 sprays each nostril at bedtime, Disp: 16 g, Rfl: 6 .  glucose blood test strip, Use as instructed, Disp: 100 each, Rfl: 12 .  Interferon Beta-1b (BETASERON) 0.3 MG KIT injection, Inject 0.25 mg into the skin every other day., Disp: 1 kit, Rfl: 6 .  isosorbide mononitrate (IMDUR) 60 MG 24 hr tablet, TAKE 1/2 TABLET BY MOUTH EVERY DAY, Disp: 45 tablet, Rfl: 3 .  levothyroxine (SYNTHROID, LEVOTHROID) 50 MCG tablet, TAKE ONE TABLET BY MOUTH EVERY DAY BEFORE BREAKFAST, Disp: 90 tablet, Rfl: 3 .  lisinopril (PRINIVIL,ZESTRIL) 10 MG tablet, TAKE ONE  TABLET BY MOUTH EVERY DAY, Disp: 90 tablet, Rfl: 1 .  metFORMIN (GLUCOPHAGE) 500 MG tablet, TAKE TWO TABLETS BY MOUTH TWICE DAILY WITH MEALS, Disp: 120 tablet, Rfl: 5 .  mupirocin ointment (BACTROBAN) 2 %, Apply 1 application topically 2 (two) times daily., Disp: 22 g, Rfl: 1 .  omeprazole (PRILOSEC) 20 MG capsule, Take 20 mg by mouth daily., Disp: , Rfl:  .  pioglitazone (ACTOS) 30 MG tablet, TAKE ONE TABLET BY MOUTH EVERY DAY, Disp: 30 tablet, Rfl: 2 .  rosuvastatin (CRESTOR) 20 MG tablet, TAKE ONE TABLET BY MOUTH EVERY DAY, Disp: 90 tablet, Rfl: 1 .  Tamsulosin HCl (FLOMAX) 0.4 MG CAPS, Take 0.4 mg by mouth daily after supper. , Disp: , Rfl:  .  Tuberculin-Allergy Syringes (ALLERGY SYRINGE 1CC/27GX1/2") 27G X 1/2" 1 ML MISC, Injection once monthly- B 12 shots, Disp: 100 each, Rfl: 1  Current Facility-Administered Medications:  .  cyanocobalamin ((VITAMIN B-12)) injection 1,000 mcg, 1,000 mcg, Intramuscular, Q30 days, Chipper Herb, MD, 1,000 mcg at 03/02/15 1647  PAST MEDICAL HISTORY: Past Medical History:  Diagnosis Date  . Acquired leg length discrepancy   . Bladder stone   . BPH associated with nocturia   . CAD (coronary artery disease) CARDIOLOGIST- DR HOCHREIN--   LAST VISIT 09-27-2011 IN [facility]   Non obstructive Cath 2010-  HX CORONARY SPASM 2004  . GERD (gastroesophageal reflux disease)   . H/O hiatal hernia   . History of nephrolithiasis 2009  . Hx of adenomatous colonic polyps   . Hyperlipidemia   . Hypertension   . Movement disorder   . Multiple sclerosis ([place]) DX  10/10---  NEUROLOGIST  DR Delfino Lovett [facility] ([place])   RIGHT SIDE AFFECTED MORE W/ WEAKNESS- - USES CANE  . Neuromuscular disorder (H[facility])    MS  . NIDDM (non-insulin dependent diabetes mellitus)   . Peyronie's disease   . RBBB   . Renal stone RIGHT  . Right ureteral stone   . Rosacea   . Vision abnormalities     PAST SURGICAL HISTORY: Past Surgical History:  Procedure Laterality Date  . CARDIAC CATHETERIZATION  02-26-2009 ---  DR Ida Rogue   NONOBSTRUCTIVE CAD/ 50% DISTAL RCA/ 30% LAD / NORMAL LVF  . CYSTOSCOPY W/ RETROGRADES  10/30/2011   Procedure: CYSTOSCOPY WITH RETROGRADE PYELOGRAM;  Surgeon: Claybon Jabs, MD;  Location: [facility];  Service: Urology;  Laterality: Right;  . CYSTOSCOPY WITH LITHOLAPAXY  10/30/2011   Procedure: CYSTOSCOPY WITH LITHOLAPAXY;  Surgeon: Claybon Jabs, MD;  Location: [facility];  Service: Urology;  Laterality: N/A;  . EXTRACORPOREAL SHOCK WAVE LITHOTRIPSY  09-05-2010   RIGHT  . NESBIT PROCEDURE  10-17-2004   CORRECTION OF PENILE ANGULATION (PEYRONIES DISEASE)    FAMILY HISTORY: Family History  Problem Relation Age of Onset  . Stroke Mother   . Heart attack Mother   . Renal cancer Father     SOCIAL HISTORY:  Social History   Social History  . Marital status: Married    Spouse name: N/A  . Number of children: N/A  . Years of education: N/A   Occupational History  . Not on file.   Social History Main Topics  . Smoking status: Former Smoker    Packs/day: 1.00    Years: 7.00    Types: Cigarettes    Quit date: 09/26/1973  . Smokeless tobacco: Current User    Types: Chew     Comment:  CHEW TOBACCO FOR 35 YRS  .  Alcohol use No  . Drug use: No  . Sexual activity: Not on file   Other Topics Concern  . Not on file   Social History Narrative  . No narrative on file     PHYSICAL EXAM  Vitals:   02/19/17 0831  BP: 106/65  Pulse: 70  Weight: 157 lb (71.2 kg)  Height: 5' 6"  (1.676 m)    Body mass index is 25.34 kg/m.   General: The patient is well-developed and well-nourished and in no acute distress  Neurologic Exam  Mental status: The patient is alert and oriented x 3 at the time of the examination. The patient has apparent normal recent and remote memory, with an apparently normal attention span and concentration ability.   Speech is normal.  Cranial nerves: Extraocular movements are full.   There is good facial sensation to soft touch bilaterally.Facial strength is normal.  Trapezius and sternocleidomastoid strength is normal. No dysarthria is noted.  The tongue is midline, and the patient has symmetric elevation of the soft palate. No obvious hearing deficits are noted.  Motor:  Muscle bulk is normal.   Tone is increased in legs, right greater than left. Strength is 2 / 5 hip flexors and 3 quads and 4-/5 distally in the right leg and 4/5 in the left leg. Strength is  5 / 5 in arms.     Sensory: Sensory testing is intact to touch and vibration sensation in all 4 extremities.  Coordination: Cerebellar testing shows good finger-nose-finger. He has reduced heel-to-shin on the left but is unable to do on the right. .    Gait and station: Station is normal.   His gait is spastic  He has a right foot drop (uses AFO) . The knee hyperextends a little bit on the right as he walks. He is unable to tandem gait.     Romberg is negative  Reflexes: Deep tendon reflexes are normal in arms and increased in his legs with spread at the right knee. He has 2 beats of nonsustained clonus on the right ankle.Marland Kitchen      DIAGNOSTIC DATA (LABS, IMAGING, TESTING) - I reviewed patient  records, labs, notes, testing and imaging myself where available.  Lab Results  Component Value Date   WBC 6.7 01/02/2017   HGB 11.6 (L) 01/02/2017   HCT 35.6 (L) 01/02/2017   MCV 87 01/02/2017   PLT 172 01/02/2017      Component Value Date/Time   NA 143 01/02/2017 0940   K 4.0 01/02/2017 0940   CL 104 01/02/2017 0940   CO2 23 01/02/2017 0940   GLUCOSE 127 (H) 01/02/2017 0940   GLUCOSE 130 (H) 01/06/2013 1311   BUN 22 01/02/2017 0940   CREATININE 1.08 01/02/2017 0940   CREATININE 1.13 01/06/2013 1311   CALCIUM 10.0 01/02/2017 0940   PROT 7.5 01/02/2017 0940   ALBUMIN 4.6 01/02/2017 0940   AST 21 01/02/2017 0940   ALT 19 01/02/2017 0940   ALKPHOS 66 01/02/2017 0940   BILITOT 0.3 01/02/2017 0940   GFRNONAA 71 01/02/2017 0940   GFRNONAA 69 01/06/2013 1311   GFRAA 82 01/02/2017 0940   GFRAA 80 01/06/2013 1311   Lab Results  Component Value Date   CHOL 116 01/02/2017   HDL 31 (L) 01/02/2017   LDLCALC 68 01/02/2017   TRIG 84 01/02/2017   CHOLHDL 3.7 01/02/2017   Lab Results  Component Value Date   HGBA1C 6.9 07/05/2015        ASSESSMENT AND  PLAN  Multiple sclerosis (HCC)  Spastic gait  Bladder disorder  Right foot drop  Other fatigue   1.   We discussed ocrelizumab and he will give this a thought. For the time being, we will continue Betaseron.   2.   Continue tamsulosin for bladder 3.   We discussed using his walker more than the cane as I believe it will be safer.  4.  He will return to see me in 6 months or sooner if he has new or worsening neurologic symptoms.   Richard A. Felecia Shelling, MD, PhD 09/20/675, 0:34 AM Certified in Neurology, Clinical Neurophysiology, Sleep Medicine, Pain Medicine and Neuroimaging  [facility] [address], [place], [place] [address] [phone]

## 2017-03-13 DIAGNOSIS — D225 Melanocytic nevi of trunk: Secondary | ICD-10-CM | POA: Diagnosis not present

## 2017-03-13 DIAGNOSIS — Z85828 Personal history of other malignant neoplasm of skin: Secondary | ICD-10-CM | POA: Diagnosis not present

## 2017-03-13 DIAGNOSIS — C4441 Basal cell carcinoma of skin of scalp and neck: Secondary | ICD-10-CM | POA: Diagnosis not present

## 2017-03-13 DIAGNOSIS — L570 Actinic keratosis: Secondary | ICD-10-CM | POA: Diagnosis not present

## 2017-03-13 DIAGNOSIS — L821 Other seborrheic keratosis: Secondary | ICD-10-CM | POA: Diagnosis not present

## 2017-03-15 ENCOUNTER — Telehealth: Payer: Self-pay | Admitting: *Deleted

## 2017-03-15 NOTE — Telephone Encounter (Signed)
LMOM that life insurance paperwork has been completed and mailed to [employer] today.  Copy mailed to pt. as well.  Copy sent to Med. Records. He does not need to return this call unless he has questions./fim

## 2017-04-04 ENCOUNTER — Other Ambulatory Visit: Payer: Self-pay | Admitting: Family Medicine

## 2017-05-01 ENCOUNTER — Other Ambulatory Visit: Payer: Self-pay | Admitting: Family Medicine

## 2017-05-01 ENCOUNTER — Other Ambulatory Visit: Payer: Self-pay | Admitting: Nurse Practitioner

## 2017-05-08 ENCOUNTER — Other Ambulatory Visit: Payer: Self-pay | Admitting: Family Medicine

## 2017-05-15 DIAGNOSIS — L821 Other seborrheic keratosis: Secondary | ICD-10-CM | POA: Diagnosis not present

## 2017-05-15 DIAGNOSIS — D225 Melanocytic nevi of trunk: Secondary | ICD-10-CM | POA: Diagnosis not present

## 2017-05-15 DIAGNOSIS — Z85828 Personal history of other malignant neoplasm of skin: Secondary | ICD-10-CM | POA: Diagnosis not present

## 2017-05-15 DIAGNOSIS — L814 Other melanin hyperpigmentation: Secondary | ICD-10-CM | POA: Diagnosis not present

## 2017-05-17 ENCOUNTER — Encounter: Payer: Self-pay | Admitting: Family Medicine

## 2017-05-17 ENCOUNTER — Ambulatory Visit (INDEPENDENT_AMBULATORY_CARE_PROVIDER_SITE_OTHER): Payer: PPO | Admitting: Family Medicine

## 2017-05-17 VITALS — BP 106/57 | HR 70 | Temp 97.1°F | Ht 66.0 in | Wt 157.0 lb

## 2017-05-17 DIAGNOSIS — G35 Multiple sclerosis: Secondary | ICD-10-CM

## 2017-05-17 DIAGNOSIS — E119 Type 2 diabetes mellitus without complications: Secondary | ICD-10-CM | POA: Diagnosis not present

## 2017-05-17 DIAGNOSIS — E1169 Type 2 diabetes mellitus with other specified complication: Secondary | ICD-10-CM | POA: Diagnosis not present

## 2017-05-17 DIAGNOSIS — E785 Hyperlipidemia, unspecified: Secondary | ICD-10-CM | POA: Diagnosis not present

## 2017-05-17 DIAGNOSIS — D519 Vitamin B12 deficiency anemia, unspecified: Secondary | ICD-10-CM | POA: Diagnosis not present

## 2017-05-17 DIAGNOSIS — I700 Atherosclerosis of aorta: Secondary | ICD-10-CM

## 2017-05-17 DIAGNOSIS — Z23 Encounter for immunization: Secondary | ICD-10-CM

## 2017-05-17 DIAGNOSIS — I10 Essential (primary) hypertension: Secondary | ICD-10-CM

## 2017-05-17 DIAGNOSIS — D692 Other nonthrombocytopenic purpura: Secondary | ICD-10-CM | POA: Diagnosis not present

## 2017-05-17 DIAGNOSIS — D510 Vitamin B12 deficiency anemia due to intrinsic factor deficiency: Secondary | ICD-10-CM

## 2017-05-17 DIAGNOSIS — I2510 Atherosclerotic heart disease of native coronary artery without angina pectoris: Secondary | ICD-10-CM | POA: Diagnosis not present

## 2017-05-17 DIAGNOSIS — E559 Vitamin D deficiency, unspecified: Secondary | ICD-10-CM

## 2017-05-17 DIAGNOSIS — N400 Enlarged prostate without lower urinary tract symptoms: Secondary | ICD-10-CM | POA: Diagnosis not present

## 2017-05-17 LAB — BAYER DCA HB A1C WAIVED: HB A1C: 7 % — AB (ref <7.0)

## 2017-05-17 NOTE — Progress Notes (Signed)
Subjective:    [name], male    DOB: [birth year], 66 y.o.   MRN: [id number]  HPI  Pt here for follow up and management of chronic medical problems which includes diabetes, hyperlipidemia and hypertension. He is taking medication regularly.  [name] has noticed specific complaints today.  His main problem is his multiple sclerosis.  He also has hyperlipidemia diabetes mellitus and aortic atherosclerosis.  Because of the MS he has a right foot drop.  He also has a history of kidney stones and GERD.  The family history is positive and his mother for stroke and heart disease with renal cell cancer in his father.  The patient recently saw his neurologist in August, Dr. Felecia Shelling.  The patient denies any chest pain or shortness of breath.  He denies any trouble with swallowing heartburn indigestion nausea vomiting diarrhea or blood in the stool.  He is passing his water without problems.  He sees the neurologist about twice yearly and was told that he had the progressive type of MS.  He is also due to get an eye exam and was told at the previous visit that he has cataracts in both eyes with the right being worse than the left.  As far as passing his water is concerned he does have increased frequency at times.  He uses his cane regularly and does have a foot drop as mentioned.  The patient does see the urologist regularly and he does his rectal exam.  We will go ahead and add a PSA test to the blood work he has had here so he will have to make 2 trips therefore blood work and exam.  He sees Dr. Jeffie Pollock.  The patient does not check blood sugars regularly at home.  I have encouraged him to do a better job with this.   Patient Active Problem List   Diagnosis Date Noted  . Aortic atherosclerosis ([place] [place]) 04/10/2016  . Rosacea 11/23/2015  . Urinary dysfunction 08/19/2015  . Right foot drop 02/17/2015  . Urinary disorder 02/17/2015  . Insomnia 02/17/2015  . Other fatigue 02/17/2015  . Spastic gait  09/16/2014  . Bladder disorder 09/16/2014  . Multiple sclerosis ([place]) 01/06/2013  . Ureteral calculus, right 10/30/2011  . Bladder calculus 10/30/2011  . Tobacco use 09/27/2011  . CORONARY ATHEROSCLEROSIS, NATIVE VESSEL 10/06/2009  . Diabetes mellitus type 2 in nonobese ([place]) 10/01/2009  . Hyperlipidemia associated with type 2 diabetes mellitus ([place]) 10/01/2009  . Essential hypertension 10/01/2009   Outpatient Encounter Prescriptions as of 05/17/2017  Medication Sig  . amLODipine (NORVASC) 2.5 MG tablet TAKE ONE TABLET BY MOUTH EVERY MORNING  . aspirin (LONGS ADULT LOW STRENGTH ASA) 81 MG EC tablet Take 81 mg by mouth daily.   . cholecalciferol (VITAMIN D) 1000 UNITS tablet Take 1,000 Units by mouth daily.  . cyanocobalamin (,VITAMIN B-12,) 1000 MCG/ML injection INJECT 1 MILLILITER INTO THE SKIN ONCE A MONTH  . doxycycline (VIBRA-TABS) 100 MG tablet TAKE ONE TABLET BY MOUTH EVERY DAY  . fish oil-omega-3 fatty acids 1000 MG capsule Take 2 g by mouth 2 (two) times daily.  . fluticasone (FLONASE) 50 MCG/ACT nasal spray One to 2 sprays each nostril at bedtime  . glucose blood test strip Use as instructed  . Interferon Beta-1b (BETASERON) 0.3 MG KIT injection Inject 0.25 mg into the skin every other day.  . isosorbide mononitrate (IMDUR) 60 MG 24 hr tablet TAKE 1/2 TABLET BY MOUTH EVERY DAY  . levothyroxine (SYNTHROID, LEVOTHROID)  50 MCG tablet TAKE ONE TABLET BY MOUTH EVERY DAY BEFORE BREAKFAST  . lisinopril (PRINIVIL,ZESTRIL) 10 MG tablet TAKE ONE TABLET BY MOUTH EVERY DAY  . metFORMIN (GLUCOPHAGE) 500 MG tablet TAKE TWO TABLETS BY MOUTH TWICE DAILY WITH MEALS  . mupirocin ointment (BACTROBAN) 2 % Apply 1 application topically 2 (two) times daily.  Marland Kitchen omeprazole (PRILOSEC) 20 MG capsule Take 20 mg by mouth daily.  . pioglitazone (ACTOS) 30 MG tablet TAKE ONE TABLET BY MOUTH EVERY DAY  . rosuvastatin (CRESTOR) 20 MG tablet TAKE ONE TABLET BY MOUTH EVERY DAY  . Tamsulosin HCl (FLOMAX) 0.4 MG  CAPS Take 0.4 mg by mouth daily after supper.   . Tuberculin-Allergy Syringes (ALLERGY SYRINGE 1CC/27GX1/2") 27G X 1/2" 1 ML MISC Injection once monthly- B 12 shots   Facility-Administered Encounter Medications as of 05/17/2017  Medication  . cyanocobalamin ((VITAMIN B-12)) injection 1,000 mcg      Review of Systems  Constitutional: Negative.   HENT: Negative.   Eyes: Negative.   Respiratory: Negative.   Cardiovascular: Negative.   Gastrointestinal: Negative.   Endocrine: Negative.   Genitourinary: Negative.   Musculoskeletal: Negative.   Skin: Negative.   Allergic/Immunologic: Negative.   Neurological: Negative.   Hematological: Negative.   Psychiatric/Behavioral: Negative.        Objective:   Physical Exam  Constitutional: He is oriented to person, place, and time. He appears well-developed and well-nourished. No distress.  The patient is calm pleasant and alert.  HENT:  Head: Normocephalic and atraumatic.  Right Ear: External ear normal.  Left Ear: External ear normal.  Nose: Nose normal.  Mouth/Throat: Oropharynx is clear and moist. No oropharyngeal exudate.  Eyes: Pupils are equal, round, and reactive to light. Conjunctivae and EOM are normal. Right eye exhibits no discharge. Left eye exhibits no discharge. No scleral icterus.  Patient is aware that he is due to get his eye exam and we will plan to set this visit up.  Neck: Normal range of motion. Neck supple. No thyromegaly present.  No bruits thyromegaly or anterior cervical adenopathy  Cardiovascular: Normal rate, regular rhythm, normal heart sounds and intact distal pulses.   No murmur heard. The heart is regular at 72/min  Pulmonary/Chest: Effort normal and breath sounds normal. He has no wheezes. He has no rales. He exhibits no tenderness.  Lungs are clear anteriorly and posteriorly with no axillary adenopathy  Abdominal: Soft. Bowel sounds are normal. He exhibits no mass. There is no tenderness. There is no  rebound and no guarding.  No liver or spleen enlargement.  No bruits.  No masses.  No inguinal adenopathy.  Genitourinary:  Genitourinary Comments: The patient missed his exam in June and plans to call and get an appointment soon for follow-up.  We will go on and do his PSA test here so that he will not have to make 2 trips in and out of [facility] for lab work and physical exam.  Musculoskeletal: He exhibits deformity. He exhibits no edema.  The patient has a foot drop on the right.  He uses a cane for ambulation.  This is due to his MS.  Lymphadenopathy:    He has no cervical adenopathy.  Neurological: He is alert and oriented to person, place, and time. He has normal reflexes. No cranial nerve deficit.  Skin: Skin is warm and dry. No rash noted.  Psychiatric: He has a normal mood and affect. His behavior is normal. Judgment and thought content normal.  Nursing note and vitals  reviewed.  BP (!) 106/57 (BP Location: Left Arm)   Pulse 70   Temp (!) 97.1 F (36.2 C) (Oral)   Ht 5' 6" (1.676 m)   Wt 157 lb (71.2 kg)   BMI 25.34 kg/m         Assessment & Plan:  1. Vitamin D deficiency -Continue current treatment pending results of lab work - CBC with Differential/Platelet - VITAMIN D 25 Hydroxy (Vit-D Deficiency, Fractures)  2. Diabetes mellitus type 2 in nonobese [facility]) -Continue current treatment pending results of lab work -Check blood sugars more regularly - BMP8+EGFR - CBC with Differential/Platelet - Bayer DCA Hb A1c Waived  3. Hyperlipidemia associated with type 2 diabetes mellitus ([place]) -Continue current treatment and aggressive therapeutic lifestyle changes - CBC with Differential/Platelet - Lipid panel  4. Essential hypertension -The blood pressure is good today and he will continue with current treatment - BMP8+EGFR - CBC with Differential/Platelet - Hepatic function panel  5. Atherosclerosis of native coronary artery of native heart without angina  pectoris -Follow-up with cardiology as needed - CBC with Differential/Platelet - Lipid panel  6. Anemia due to vitamin B12 deficiency, unspecified B12 deficiency type -We will make sure we get a B12 level on him today if he is not had one in the past year. - CBC with Differential/Platelet  7. Multiple sclerosis ([place]) -Follow-up with neurology as needed - CBC with Differential/Platelet  8. Aortic atherosclerosis ([place]) -Continue aggressive therapeutic lifestyle changes and current treatment for cholesterol pending results of lab work - CBC with Differential/Platelet  9. Vitamin B12 deficiency anemia due to intrinsic factor deficiency -B12 level today  10. Senile purpura ([facility]) -No bruising noted today with this previous history of purpura  11. Benign prostatic hyperplasia, unspecified whether lower urinary tract symptoms present -Follow-up with urology as planned - PSA, total and free  Patient Instructions                       Medicare Annual Wellness Visit  [facility] and the medical providers at [facility] strive to bring you the best medical care.  In doing so we not only want to address your current medical conditions and concerns but also to detect new conditions early and prevent illness, disease and health-related problems.    Medicare offers a yearly Wellness Visit which allows our clinical staff to assess your need for preventative services including immunizations, lifestyle education, counseling to decrease risk of preventable diseases and screening for fall risk and other medical concerns.    This visit is provided free of charge (no copay) for all Medicare recipients. The clinical pharmacists at [facility] have begun to conduct these Wellness Visits which will also include a thorough review of all your medications.    As you primary medical provider recommend that you make an appointment for your Annual Wellness Visit if  you have not done so already this year.  You may set up this appointment before you leave today or you may call back ([phone]) and schedule an appointment.  Please make sure when you call that you mention that you are scheduling your Annual Wellness Visit with the clinical pharmacist so that the appointment may be made for the proper length of time.     Continue current medications. Continue good therapeutic lifestyle changes which include good diet and exercise. Fall precautions discussed with patient. If an FOBT was given today- please return it to our front desk. If  you are over 40 years old - you may need Prevnar 5 or the adult Pneumonia vaccine.  **Flu shots are available--- please call and schedule a FLU-CLINIC appointment**  After your visit with [place] today you will receive a survey in the mail or online from [employer] regarding your care with [place]. Please take a moment to fill this out. Your feedback is very important to [place] as you can help [place] better understand your patient needs as well as improve your experience and satisfaction. WE CARE ABOUT YOU!!!  Continue to follow-up with the neurologist every 6 months Continue to follow-up with the urologist for rectal exams and kidney stones Continue to drink plenty of fluids Do not put yourself at risk for falling by avoiding climbing. Be sure and take a copy of your lab work with you to your next visit to the urologist Try to check a few blood sugar readings each month both fasting and 4 hours after eating   Arrie Senate MD

## 2017-05-17 NOTE — Patient Instructions (Addendum)
Medicare Annual Wellness Visit  [facility] and the medical providers at [facility] strive to bring you the best medical care.  In doing so we not only want to address your current medical conditions and concerns but also to detect new conditions early and prevent illness, disease and health-related problems.    Medicare offers a yearly Wellness Visit which allows our clinical staff to assess your need for preventative services including immunizations, lifestyle education, counseling to decrease risk of preventable diseases and screening for fall risk and other medical concerns.    This visit is provided free of charge (no copay) for all Medicare recipients. The clinical pharmacists at [facility] have begun to conduct these Wellness Visits which will also include a thorough review of all your medications.    As you primary medical provider recommend that you make an appointment for your Annual Wellness Visit if you have not done so already this year.  You may set up this appointment before you leave today or you may call back ([phone]) and schedule an appointment.  Please make sure when you call that you mention that you are scheduling your Annual Wellness Visit with the clinical pharmacist so that the appointment may be made for the proper length of time.     Continue current medications. Continue good therapeutic lifestyle changes which include good diet and exercise. Fall precautions discussed with patient. If an FOBT was given today- please return it to our front desk. If you are over 95 years old - you may need Prevnar 42 or the adult Pneumonia vaccine.  **Flu shots are available--- please call and schedule a FLU-CLINIC appointment**  After your visit with [place] today you will receive a survey in the mail or online from [employer] regarding your care with [place]. Please take a moment to fill this out. Your feedback is very  important to [place] as you can help [place] better understand your patient needs as well as improve your experience and satisfaction. WE CARE ABOUT YOU!!!  Continue to follow-up with the neurologist every 6 months Continue to follow-up with the urologist for rectal exams and kidney stones Continue to drink plenty of fluids Do not put yourself at risk for falling by avoiding climbing. Be sure and take a copy of your lab work with you to your next visit to the urologist Try to check a few blood sugar readings each month both fasting and 4 hours after eating

## 2017-05-18 ENCOUNTER — Encounter: Payer: Self-pay | Admitting: Family Medicine

## 2017-05-18 DIAGNOSIS — D696 Thrombocytopenia, unspecified: Secondary | ICD-10-CM | POA: Insufficient documentation

## 2017-05-18 LAB — CBC WITH DIFFERENTIAL/PLATELET
BASOS: 0 %
Basophils Absolute: 0 10*3/uL (ref 0.0–0.2)
EOS (ABSOLUTE): 0.1 10*3/uL (ref 0.0–0.4)
EOS: 2 %
HEMOGLOBIN: 10.9 g/dL — AB (ref 13.0–17.7)
Hematocrit: 33.1 % — ABNORMAL LOW (ref 37.5–51.0)
IMMATURE GRANS (ABS): 0 10*3/uL (ref 0.0–0.1)
IMMATURE GRANULOCYTES: 0 %
LYMPHS: 14 %
Lymphocytes Absolute: 0.5 10*3/uL — ABNORMAL LOW (ref 0.7–3.1)
MCH: 28 pg (ref 26.6–33.0)
MCHC: 32.9 g/dL (ref 31.5–35.7)
MCV: 85 fL (ref 79–97)
MONOCYTES: 15 %
Monocytes Absolute: 0.6 10*3/uL (ref 0.1–0.9)
NEUTROS ABS: 2.7 10*3/uL (ref 1.4–7.0)
NEUTROS PCT: 69 %
PLATELETS: 147 10*3/uL — AB (ref 150–379)
RBC: 3.89 x10E6/uL — ABNORMAL LOW (ref 4.14–5.80)
RDW: 16 % — ABNORMAL HIGH (ref 12.3–15.4)
WBC: 3.9 10*3/uL (ref 3.4–10.8)

## 2017-05-18 LAB — HEPATIC FUNCTION PANEL
ALK PHOS: 61 IU/L (ref 39–117)
ALT: 17 IU/L (ref 0–44)
AST: 22 IU/L (ref 0–40)
Albumin: 4.4 g/dL (ref 3.6–4.8)
BILIRUBIN, DIRECT: 0.12 mg/dL (ref 0.00–0.40)
Bilirubin Total: 0.3 mg/dL (ref 0.0–1.2)
TOTAL PROTEIN: 6.9 g/dL (ref 6.0–8.5)

## 2017-05-18 LAB — LIPID PANEL
Chol/HDL Ratio: 4.1 ratio (ref 0.0–5.0)
Cholesterol, Total: 118 mg/dL (ref 100–199)
HDL: 29 mg/dL — AB (ref >39)
LDL CALC: 72 mg/dL (ref 0–99)
Triglycerides: 86 mg/dL (ref 0–149)
VLDL CHOLESTEROL CAL: 17 mg/dL (ref 5–40)

## 2017-05-18 LAB — BMP8+EGFR
BUN/Creatinine Ratio: 19 (ref 10–24)
BUN: 20 mg/dL (ref 8–27)
CALCIUM: 9.4 mg/dL (ref 8.6–10.2)
CO2: 20 mmol/L (ref 20–29)
CREATININE: 1.03 mg/dL (ref 0.76–1.27)
Chloride: 104 mmol/L (ref 96–106)
GFR, EST AFRICAN AMERICAN: 86 mL/min/{1.73_m2} (ref >59)
GFR, EST NON AFRICAN AMERICAN: 75 mL/min/{1.73_m2} (ref >59)
Glucose: 127 mg/dL — ABNORMAL HIGH (ref 65–99)
POTASSIUM: 4.2 mmol/L (ref 3.5–5.2)
Sodium: 139 mmol/L (ref 134–144)

## 2017-05-18 LAB — PSA, TOTAL AND FREE
PROSTATE SPECIFIC AG, SERUM: 3.8 ng/mL (ref 0.0–4.0)
PSA FREE PCT: 27.1 %
PSA FREE: 1.03 ng/mL

## 2017-05-18 LAB — VITAMIN D 25 HYDROXY (VIT D DEFICIENCY, FRACTURES): VIT D 25 HYDROXY: 41.4 ng/mL (ref 30.0–100.0)

## 2017-05-28 DIAGNOSIS — E119 Type 2 diabetes mellitus without complications: Secondary | ICD-10-CM | POA: Diagnosis not present

## 2017-05-28 DIAGNOSIS — H25813 Combined forms of age-related cataract, bilateral: Secondary | ICD-10-CM | POA: Diagnosis not present

## 2017-05-28 DIAGNOSIS — H04123 Dry eye syndrome of bilateral lacrimal glands: Secondary | ICD-10-CM | POA: Diagnosis not present

## 2017-05-28 DIAGNOSIS — Z7984 Long term (current) use of oral hypoglycemic drugs: Secondary | ICD-10-CM | POA: Diagnosis not present

## 2017-05-28 LAB — HM DIABETES EYE EXAM

## 2017-05-30 ENCOUNTER — Other Ambulatory Visit: Payer: Self-pay | Admitting: Family Medicine

## 2017-06-28 ENCOUNTER — Telehealth: Payer: Self-pay | Admitting: *Deleted

## 2017-06-28 MED ORDER — BETASERON 0.3 MG ~~LOC~~ KIT: 0.2500 mg | PACK | SUBCUTANEOUS | 11 refills | Status: DC

## 2017-06-28 NOTE — Telephone Encounter (Signed)
Betaseron rx. printed, will fax to pt. assistance once RAS returns to the office on 07/02/17 and signs it/fim

## 2017-07-05 ENCOUNTER — Ambulatory Visit (INDEPENDENT_AMBULATORY_CARE_PROVIDER_SITE_OTHER): Payer: PPO | Admitting: *Deleted

## 2017-07-05 VITALS — BP 119/68 | HR 71 | Temp 98.7°F | Ht 66.0 in | Wt 157.0 lb

## 2017-07-05 DIAGNOSIS — Z0000 Encounter for general adult medical examination without abnormal findings: Secondary | ICD-10-CM | POA: Diagnosis not present

## 2017-07-05 NOTE — Patient Instructions (Signed)
  Jacob Fowler , Thank you for taking time to come for your Medicare Wellness Visit. I appreciate your ongoing commitment to your health goals. Please review the following plan we discussed and let me know if I can assist you in the future.   These are the goals we discussed: Goals    . Exercise 3x per week (30 min per time)     Pt would like to "get around better"        This is a list of the screening recommended for you and due dates:  Health Maintenance  Topic Date Due  .  Hepatitis C: One time screening is recommended by Center for Disease Control  (CDC) for  adults born from [birth year] through 1966.   08/04/2017*  . Pneumonia vaccines (2 of 2 - PPSV23) 08/17/2017*  . Hemoglobin A1C  11/14/2017  . Stool Blood Test  01/05/2018  . Complete foot exam   05/17/2018  . Eye exam for diabetics  05/28/2018  . Tetanus Vaccine  02/17/2024  . Flu Shot  Completed  *Topic was postponed. The date shown is not the original due date.    Continue to follow up with DR Laurance Flatten and other specialist regularly.

## 2017-07-05 NOTE — Progress Notes (Signed)
Subjective:   Jacob Fowler is a 66 y.o. male who presents for an Initial Medicare Annual Wellness Visit. He is a retired Scientist, forensic who lives at home with his wife, Jacob Fowler. 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 He tries to get out and walk 30 minutes a day everyday. He states that his diet is semi-healthy and that his wife does a lot of home cooking. They generally eat 3 meals a day. He attends church on Sundays and he has 2 children. One lives behind him , on their farm and the other lives in [place]. He has 2 dogs and a goat that he care for. He is aware of fall hazards and he states that his health is about the same as it was a year ago.   Review of Systems  Cardiac Risk Factors include: hypertension;diabetes mellitus;dyslipidemia;male gender    Objective:    Today's Vitals   07/05/17 0818  BP: 119/68  Pulse: 71  Temp: 98.7 F (37.1 C)  TempSrc: Oral  Weight: 157 lb (71.2 kg)  Height: 5' 6"  (1.676 m)   Body mass index is 25.34 kg/m.  Advanced Directives 07/05/2017 10/30/2011 10/25/2011  Does Patient Have a Medical Advance Directive? Yes Patient has advance directive, copy not in chart Patient has advance directive, copy in chart  Type of Advance Directive Living will;Healthcare Power of [place] will Living will  Does patient want to make changes to medical advance directive? No - Patient declined - -  Copy of [place] in Chart? No - copy requested Copy requested from family -  Pre-existing out of facility DNR order (yellow form or pink MOST form) - No -    Current Medications (verified) Outpatient Encounter Medications as of 07/05/2017  Medication Sig  . amLODipine (NORVASC) 2.5 MG tablet TAKE ONE TABLET BY MOUTH EVERY MORNING  . aspirin (LONGS ADULT LOW STRENGTH ASA) 81 MG EC tablet Take 81 mg by mouth daily.   Marland Kitchen BETASERON 0.3 MG KIT injection Inject 0.25 mg into the skin every other day.  . cholecalciferol (VITAMIN D) 1000  UNITS tablet Take 1,000 Units by mouth daily.  . cyanocobalamin (,VITAMIN B-12,) 1000 MCG/ML injection INJECT 1 MILLILITER INTO THE SKIN ONCE A MONTH  . doxycycline (VIBRA-TABS) 100 MG tablet TAKE ONE TABLET BY MOUTH EVERY DAY  . fish oil-omega-3 fatty acids 1000 MG capsule Take 2 g by mouth 2 (two) times daily.  Marland Kitchen glucose blood test strip Use as instructed  . isosorbide mononitrate (IMDUR) 60 MG 24 hr tablet TAKE 1/2 TABLET BY MOUTH EVERY DAY  . levothyroxine (SYNTHROID, LEVOTHROID) 50 MCG tablet TAKE ONE TABLET BY MOUTH EVERY DAY BEFORE BREAKFAST  . lisinopril (PRINIVIL,ZESTRIL) 10 MG tablet TAKE ONE TABLET BY MOUTH EVERY DAY  . metFORMIN (GLUCOPHAGE) 500 MG tablet TAKE TWO TABLETS BY MOUTH TWICE DAILY WITH MEALS  . omeprazole (PRILOSEC) 20 MG capsule Take 20 mg by mouth daily.  . pioglitazone (ACTOS) 30 MG tablet TAKE ONE TABLET BY MOUTH EVERY DAY  . rosuvastatin (CRESTOR) 20 MG tablet TAKE ONE TABLET BY MOUTH EVERY DAY  . Tamsulosin HCl (FLOMAX) 0.4 MG CAPS Take 0.4 mg by mouth daily after supper.   . Tuberculin-Allergy Syringes (ALLERGY SYRINGE 1CC/27GX1/2") 27G X 1/2" 1 ML MISC Injection once monthly- B 12 shots  . [DISCONTINUED] fluticasone (FLONASE) 50 MCG/ACT nasal spray One to 2 sprays each nostril at bedtime  . [DISCONTINUED] mupirocin ointment (BACTROBAN) 2 % Apply 1 application topically  2 (two) times daily.   Facility-Administered Encounter Medications as of 07/05/2017  Medication  . cyanocobalamin ((VITAMIN B-12)) injection 1,000 mcg    Allergies (verified) Patient has no known allergies.   History: Past Medical History:  Diagnosis Date  . Acquired leg length discrepancy   . Bladder stone   . BPH associated with nocturia   . CAD (coronary artery disease) CARDIOLOGIST- DR HOCHREIN--  LAST VISIT 09-27-2011 IN EPIC   Non obstructive Cath 2010-  HX CORONARY SPASM 2004  . Cataract    bilateral   . GERD (gastroesophageal reflux disease)   . H/O hiatal hernia   . History  of nephrolithiasis 2009  . Hx of adenomatous colonic polyps   . Hyperlipidemia   . Hypertension   . Movement disorder   . Multiple sclerosis ([place]) DX  10/10---  NEUROLOGIST  DR Delfino Lovett [facility] ([place])   RIGHT SIDE AFFECTED MORE W/ WEAKNESS- - USES CANE  . Neuromuscular disorder ([facility])    MS  . NIDDM (non-insulin dependent diabetes mellitus)   . Peyronie's disease   . RBBB   . Renal stone RIGHT  . Right ureteral stone   . Rosacea   . Vision abnormalities    Past Surgical History:  Procedure Laterality Date  . CARDIAC CATHETERIZATION  02-26-2009 ---  DR Ida Rogue   NONOBSTRUCTIVE CAD/ 50% DISTAL RCA/ 30% LAD / NORMAL LVF  . CYSTOSCOPY W/ RETROGRADES  10/30/2011   Procedure: CYSTOSCOPY WITH RETROGRADE PYELOGRAM;  Surgeon: Claybon Jabs, MD;  Location[facility];  Service: Urology;  Laterality: Right;  . CYSTOSCOPY WITH LITHOLAPAXY  10/30/2011   Procedure: CYSTOSCOPY WITH LITHOLAPAXY;  Surgeon: Claybon Jabs, MD;  Location[facility];  Service: Urology;  Laterality: N/A;  . EXTRACORPOREAL SHOCK WAVE LITHOTRIPSY  09-05-2010   RIGHT  . NESBIT PROCEDURE  10-17-2004   CORRECTION OF PENILE ANGULATION (PEYRONIES DISEASE)   Family History  Problem Relation Age of Onset  . Stroke Mother   . Heart attack Mother   . Diabetes Mother   . Hypertension Mother   . Renal cancer Father   . Diabetes Sister   . Heart attack Brother   . Diabetes Son   . Heart attack Maternal Grandfather   . Heart attack Brother   . Bladder Cancer Brother    Social History   Socioeconomic History  . Marital status: Married    Spouse name: Jacob Fowler  . Number of children: 2  . Years of education: None  . Highest education level: None  Social Needs  . Financial resource strain: None  . Food insecurity - worry: None  . Food insecurity - inability: None  . Transportation needs - medical: None  . Transportation needs - non-medical: None  Occupational History  .  Occupation: retired    Comment: self -employed  Tobacco Use  . Smoking status: Former Smoker    Packs/day: 1.00    Years: 7.00    Pack years: 7.00    Types: Cigarettes    Last attempt to quit: 09/26/1973    Years since quitting: 43.8  . Smokeless tobacco: Current User    Types: Chew  . Tobacco comment: CHEW TOBACCO FOR 35 YRS  Substance and Sexual Activity  . Alcohol use: No    Alcohol/week: 0.0 oz  . Drug use: No  . Sexual activity: None  Other Topics Concern  . None  Social History Narrative  . None   Tobacco Counseling Ready to quit:  Not Answered Counseling given: Not Answered Comment: CHEW TOBACCO FOR 35 YRS   Clinical Intake:                       Activities of Daily Living In your present state of health, do you have any difficulty performing the following activities: 07/05/2017  Hearing? N  Vision? Y  Comment Rx glasses / cataracts  Difficulty concentrating or making decisions? N  Walking or climbing stairs? Y  Comment MS   Dressing or bathing? N  Doing errands, shopping? N  Preparing Food and eating ? N  Using the Toilet? N  In the past six months, have you accidently leaked urine? N  Comment slight   Do you have problems with loss of bowel control? N  Managing your Medications? N  Managing your Finances? N  Housekeeping or managing your Housekeeping? N  Some recent data might be hidden     Immunizations and Health Maintenance Immunization History  Administered Date(s) Administered  . Influenza, High Dose Seasonal PF 04/10/2016, 05/17/2017  . Influenza,inj,Quad PF,6+ Mos 05/14/2013, 05/27/2014, 07/05/2015  . Pneumococcal Conjugate-13 05/27/2013  . Pneumococcal Polysaccharide-23 04/15/2010  . Tdap 02/16/2014   There are no preventive care reminders to display for this patient.  Patient Care Team: Chipper Herb, MD as PCP - General (Family Medicine) Sater, Nanine Means, MD (Neurology) Kathie Rhodes, MD as Consulting Physician  (Urology) Minus Breeding, MD as Consulting Physician (Cardiology)  Indicate any recent Medical Services you may have received from other than [facility] providers in the past year (date may be approximate).    Assessment:   This is a routine wellness examination for Jacob Fowler.  Hearing/Vision screen No exam data present Had a recent eye exam in November, and is scheduled for cataract consultaion in the next few weeks.  Dietary issues and exercise activities discussed: Current Exercise Habits: Home exercise routine, Type of exercise: walking, Time (Minutes): 35, Frequency (Times/Week): 7, Weekly Exercise (Minutes/Week): 245, Intensity: Mild, Exercise limited by: neurologic condition(s)  Goals    . Exercise 3x per week (30 min per time)     Pt would like to "get around better"       Depression Screen PHQ 2/9 Scores 07/05/2017 05/17/2017 01/02/2017 08/15/2016  PHQ - 2 Score 0 0 0 0    Fall Risk Fall Risk  07/05/2017 05/17/2017 01/02/2017 08/15/2016 04/10/2016  Falls in the past year? Yes No Yes No No  Number falls in past yr: 2 or more - 2 or more - -  Injury with Fall? No - No - -    Is the patient's home free of loose throw rugs in walkways, pet beds, electrical cords, etc?        Yes, he is aware of fall risk.  Timed Get Up and Go performed:    Cognitive Function: MMSE - Mini Mental State Exam 07/05/2017  Orientation to time 5  Orientation to Place 5  Registration 3  Attention/ Calculation 5  Recall 2  Language- name 2 objects 2  Language- repeat 1  Language- follow 3 step command 3  Language- read & follow direction 1  Write a sentence 1  Copy design 1  Total score 29        Screening Tests Health Maintenance  Topic Date Due  . Hepatitis C Screening  08/04/2017 (Originally 03/27/1951)  . PNA vac Low Risk Adult (2 of 2 - PPSV23) 08/17/2017 (Originally 01/10/2015)  . HEMOGLOBIN A1C  11/14/2017  .  COLON CANCER SCREENING ANNUAL FOBT  01/05/2018  . FOOT EXAM  05/17/2018  .  OPHTHALMOLOGY EXAM  05/28/2018  . TETANUS/TDAP  02/17/2024  . INFLUENZA VACCINE  Completed    Qualifies for Shingles Vaccine? He can not take this per his neurologist, due to interaction with his medication.   Cancer Screenings: Lung: Low Dose CT Chest recommended if Age 83-80 years, 30 pack-year currently smoking OR have quit w/in 15years. Patient does qualify. Colorectal: does fobt regularly.   Additional Screenings: Hepatitis B/HIV/Syphillis: Hepatitis C Screening:       Plan:   Pt is to follow up regularly with DR Laurance Flatten and other specialist. He is to bring in a copy of his advanced directives. He should  Be careful not put himself at risk for falls.    I have personally reviewed and noted the following in the patient's chart:   . Medical and social history . Use of alcohol, tobacco or illicit drugs  . Current medications and supplements . Functional ability and status . Nutritional status . Physical activity . Advanced directives . List of other physicians . Hospitalizations, surgeries, and ER visits in previous 12 months . Vitals . Screenings to include cognitive, depression, and falls . Referrals and appointments  In addition, I have reviewed and discussed with patient certain preventive protocols, quality metrics, and best practice recommendations. A written personalized care plan for preventive services as well as general preventive health recommendations were provided to patient.     Axyl Sitzman, Cameron Proud, LPN   [id number]   I have reviewed and agree with the above AWV documentation.   Mary-Margaret Hassell Done, FNP

## 2017-07-19 ENCOUNTER — Other Ambulatory Visit: Payer: Self-pay | Admitting: Family Medicine

## 2017-08-01 DIAGNOSIS — E113293 Type 2 diabetes mellitus with mild nonproliferative diabetic retinopathy without macular edema, bilateral: Secondary | ICD-10-CM | POA: Diagnosis not present

## 2017-08-01 DIAGNOSIS — H11001 Unspecified pterygium of right eye: Secondary | ICD-10-CM | POA: Diagnosis not present

## 2017-08-01 DIAGNOSIS — H527 Unspecified disorder of refraction: Secondary | ICD-10-CM | POA: Diagnosis not present

## 2017-08-01 DIAGNOSIS — H25813 Combined forms of age-related cataract, bilateral: Secondary | ICD-10-CM | POA: Diagnosis not present

## 2017-08-01 LAB — HM DIABETES EYE EXAM

## 2017-08-08 ENCOUNTER — Telehealth: Payer: Self-pay | Admitting: *Deleted

## 2017-08-08 NOTE — Telephone Encounter (Signed)
[employer] Pt. Assistance Forms completed (to help cover cost of Betaseron) and faxed back to [place] at fax# [phone].  LMOM pt's home# to let them know this has been done/fim

## 2017-08-16 DIAGNOSIS — H25813 Combined forms of age-related cataract, bilateral: Secondary | ICD-10-CM | POA: Diagnosis not present

## 2017-08-22 ENCOUNTER — Encounter (INDEPENDENT_AMBULATORY_CARE_PROVIDER_SITE_OTHER): Payer: Self-pay

## 2017-08-22 ENCOUNTER — Encounter: Payer: Self-pay | Admitting: Neurology

## 2017-08-22 ENCOUNTER — Other Ambulatory Visit: Payer: Self-pay

## 2017-08-22 ENCOUNTER — Ambulatory Visit: Payer: PPO | Admitting: Neurology

## 2017-08-22 VITALS — BP 127/65 | HR 63 | Resp 16 | Ht 66.0 in | Wt 158.0 lb

## 2017-08-22 DIAGNOSIS — G35 Multiple sclerosis: Secondary | ICD-10-CM

## 2017-08-22 DIAGNOSIS — R261 Paralytic gait: Secondary | ICD-10-CM

## 2017-08-22 DIAGNOSIS — R39198 Other difficulties with micturition: Secondary | ICD-10-CM

## 2017-08-22 DIAGNOSIS — R5383 Other fatigue: Secondary | ICD-10-CM | POA: Diagnosis not present

## 2017-08-22 MED ORDER — TAMSULOSIN HCL 0.4 MG PO CAPS: 0.4000 mg | ORAL_CAPSULE | Freq: Every day | ORAL | 11 refills | Status: DC

## 2017-08-22 MED ORDER — DESMOPRESSIN ACETATE 0.1 MG PO TABS: 0.1000 mg | ORAL_TABLET | Freq: Every day | ORAL | 11 refills | Status: DC

## 2017-08-22 NOTE — Progress Notes (Signed)
[facility]  PATIENT: Jacob Fowler DOB: 1951-07-07  REFERRING DOCTOR OR PCP:  Redge Gainer SOURCE: patient  _________________________________   HISTORICAL  CHIEF COMPLAINT:  Chief Complaint  Patient presents with  . Multiple Sclerosis    Sts. he continues to tolerate Betaseron well and denies new or worsening sx/fim    HISTORY OF PRESENT ILLNESS:  Jacob Fowler is a 67 year old man with multiple sclerosis.      Update 08/22/2017:    He feels he is doing fairly well. He continues on Betaseron and tolerates it well. His last CBC did show that the lymphocytes were reduced at 0.5. It was done 05/17/2017. He also notes that gait is a little worse. 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 He has had a couple falls. He uses an AFO on the right leg. Ampyra had not helped him in the past. Has more issues with his bladder. He is emptying better with the tamsulosin but he still has a lot of of urgency. He has nocturia about 5 times a night. Vision is fine.  He notes some fatigue but this is no worse than typical. He has no problems with mood or change in cognition.   From 02/19/2017: He denies any major changes though does feel his gait has worsened.   Since the last visit he has had his MRIs and blood work.   MRI's 09/04/16 were unchanged form 2016.   He has a plaque to the left at C4-C5 and to the right at T1, unchanged. He also has plaques in the brain consistent with MS.   Two are located in the brainstem.  CBC/LFTs were fine 01/02/17.   MS   He feels stable.  He is on Betaseron and he tolerates it well. We have discussed ocrelizumab as it can work for relapsing remitting as well as primary progressive MS which appears to be more consistent with his disease course  Gait/strength/sensation: He has poor gait due to right >> left leg weakness and spasticity.   He has a right AFO and it helps his foot drop some. With the cane, he can walk  long distances. He does not like the walker.   He falls 1-2 times a month.  He loses balance if he takes some steps backwards.     He denies any numbness in the legs     He denies any significant numbness, weakness or clumsiness in the arms.   He he notes some spasticity in the right leg but he denies denies painful  spasms.     He tried Ampyra in the past but it has not helped.     Bladder: He has urinary frequency,  urgency and urinary hesitancy. Tamsulosin helps.  Oxybutynin did not help much and Myrbetriq was expensive  Now he needs to urinate q 2-3 hours during the day and 0 to 1 at night.  He has not had any incontinence.     Vision: He denies any MS related visual problems. He wears glasses for correction.  Fatigue/sleep: He is active and gets outside every day. 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   His fatigue is more physical and cognitive and is present as the day goes on and with heat  Ampyra did not help. Amantadine did not help   He sually starts every day feeling well rested.    He is sleeping well most nights.  He does not snore much and his wife has not noted any apnea or gasping.   Mood/cognition:   He denies depression or anxiety. He feels his short-term memory is mildly worse than last year but notes no other cognitive issue.   MS History:   He began to note significant difficulty with gait in January 2010.      He had some back x-rays. He was then referred to neurosurgery that noted that he had plaques in his spine c/w  MS. He had further testing including MRI of the brain and spinal tap. These were all consistent with multiple sclerosis and by November 2010 he had started Betaseron. He feels that he has been mostly stable over the past 5 years but has noted that his gait is a little worse than it was in 2010. About 2013, he started using a cane.  MRI data (Images personally reviewed at previous visit) MRI Images  09/04/16 were  personally reviewed and compared.  I concur with the interpretations: IMPRESSION:  This MRI of the cervical spine with and without contrast shows the following: 1.    T2 hyperintense lesions within the spinal cord to the left adjacent to C4-C5 and to the right adjacent to T1, unchanged in appearance when compared to the previous MRI.   2.    Mild degenerative changes at C3-C4, C4-C5 and C6-C7 that did not lead to any nerve root compression. 3.    There is a normal enhancement pattern and there are no acute findings.   IMPRESSION:  This MRI of the brain with and without contrast shows the following: 1.    Multiple T2/FLAIR hyperintense foci in the periventricular, juxtacortical and deep white matter of the hemispheres and 2 foci in the brainstem in a pattern and configuration consistent with chronic demyelinating plaque associated with multiple sclerosis. None of the foci appears to be acute. When compared to the MRI dated 09/28/2014, there is no interval change. 2.   Mild to moderate cortical atrophy that has mildly progressed when compared to the previous MRI. 3.    There are no acute findings and there is a normal enhancement pattern.  REVIEW OF SYSTEMS: Constitutional: No fevers, chills, sweats, or change in appetite.  He notes fatigue  Eyes: No visual changes, double vision, eye pain Ear, nose and throat: No hearing loss, ear pain, nasal congestion, sore throat Cardiovascular: No chest pain, palpitations Respiratory: No shortness of breath at rest or with exertion.   No wheezes GastrointestinaI: No nausea, vomiting, diarrhea, abdominal pain, fecal incontinence Genitourinary: urinary frequency and hesitancy  .  No nocturia now Musculoskeletal: No neck pain, back pain Integumentary: No rash, pruritus, skin lesions Neurological: as above Psychiatric: No depression at this time.  No anxiety Endocrine: No palpitations, diaphoresis, change in appetite, change in weigh or increased  thirst Hematologic/Lymphatic: No anemia, purpura, petechiae. Allergic/Immunologic: No itchy/runny eyes, nasal congestion, recent allergic reactions, rashes  ALLERGIES: No Known Allergies  HOME MEDICATIONS:  Current Outpatient Medications:  .  amLODipine (NORVASC) 2.5 MG tablet, TAKE ONE TABLET BY MOUTH EVERY MORNING, Disp: 90 tablet, Rfl: 1 .  aspirin (LONGS ADULT LOW STRENGTH ASA) 81 MG EC tablet, Take 81 mg by mouth daily. , Disp: , Rfl:  .  BETASERON 0.3 MG KIT injection, Inject 0.25 mg into the skin every other day., Disp: 1 kit, Rfl: 11 .  cholecalciferol (VITAMIN D) 1000 UNITS tablet, Take 1,000 Units by mouth daily., Disp: , Rfl:  .  cyanocobalamin (,VITAMIN B-12,)  1000 MCG/ML injection, INJECT 1 MILLILITER INTO THE SKIN ONCE A MONTH, Disp: 3 mL, Rfl: 3 .  doxycycline (VIBRA-TABS) 100 MG tablet, TAKE ONE TABLET BY MOUTH EVERY DAY, Disp: 42 tablet, Rfl: 0 .  fish oil-omega-3 fatty acids 1000 MG capsule, Take 2 g by mouth 2 (two) times daily., Disp: , Rfl:  .  glucose blood test strip, Use as instructed, Disp: 100 each, Rfl: 12 .  isosorbide mononitrate (IMDUR) 60 MG 24 hr tablet, TAKE 1/2 TABLET BY MOUTH EVERY DAY, Disp: 45 tablet, Rfl: 3 .  levothyroxine (SYNTHROID, LEVOTHROID) 50 MCG tablet, TAKE ONE TABLET BY MOUTH EVERY DAY BEFORE BREAKFAST, Disp: 90 tablet, Rfl: 3 .  lisinopril (PRINIVIL,ZESTRIL) 10 MG tablet, TAKE ONE TABLET BY MOUTH EVERY DAY, Disp: 90 tablet, Rfl: 0 .  metFORMIN (GLUCOPHAGE) 500 MG tablet, TAKE TWO TABLETS BY MOUTH TWICE DAILY WITH MEALS, Disp: 120 tablet, Rfl: 1 .  omeprazole (PRILOSEC) 20 MG capsule, Take 20 mg by mouth daily., Disp: , Rfl:  .  pioglitazone (ACTOS) 30 MG tablet, TAKE ONE TABLET BY MOUTH EVERY DAY, Disp: 30 tablet, Rfl: 2 .  rosuvastatin (CRESTOR) 20 MG tablet, TAKE ONE TABLET BY MOUTH EVERY DAY, Disp: 90 tablet, Rfl: 0 .  tamsulosin (FLOMAX) 0.4 MG CAPS capsule, Take 1 capsule (0.4 mg total) by mouth daily after supper., Disp: 30 capsule,  Rfl: 11 .  Tuberculin-Allergy Syringes (ALLERGY SYRINGE 1CC/27GX1/2") 27G X 1/2" 1 ML MISC, Injection once monthly- B 12 shots, Disp: 100 each, Rfl: 1 .  desmopressin (DDAVP) 0.1 MG tablet, Take 1 tablet (0.1 mg total) by mouth daily., Disp: 30 tablet, Rfl: 11  Current Facility-Administered Medications:  .  cyanocobalamin ((VITAMIN B-12)) injection 1,000 mcg, 1,000 mcg, Intramuscular, Q30 days, Chipper Herb, MD, 1,000 mcg at 03/02/15 1647  PAST MEDICAL HISTORY: Past Medical History:  Diagnosis Date  . Acquired leg length discrepancy   . Bladder stone   . BPH associated with nocturia   . CAD (coronary artery disease) CARDIOLOGIST- DR HOCHREIN--  LAST VISIT 09-27-2011 IN EPIC   Non obstructive Cath 2010-  HX CORONARY SPASM 2004  . Cataract    bilateral   . GERD (gastroesophageal reflux disease)   . H/O hiatal hernia   . History of nephrolithiasis 2009  . Hx of adenomatous colonic polyps   . Hyperlipidemia   . Hypertension   . Movement disorder   . Multiple sclerosis ([place]) DX  10/10---  NEUROLOGIST  DR Delfino Lovett [facility] ([place])   RIGHT SIDE AFFECTED MORE W/ WEAKNESS- - USES CANE  . Neuromuscular disorder ([facility])    MS  . NIDDM (non-insulin dependent diabetes mellitus)   . Peyronie's disease   . RBBB   . Renal stone RIGHT  . Right ureteral stone   . Rosacea   . Vision abnormalities     PAST SURGICAL HISTORY: Past Surgical History:  Procedure Laterality Date  . CARDIAC CATHETERIZATION  02-26-2009 ---  DR Ida Rogue   NONOBSTRUCTIVE CAD/ 50% DISTAL RCA/ 30% LAD / NORMAL LVF  . CYSTOSCOPY W/ RETROGRADES  10/30/2011   Procedure: CYSTOSCOPY WITH RETROGRADE PYELOGRAM;  Surgeon: Claybon Jabs, MD;  Location: [facility];  Service: Urology;  Laterality: Right;  . CYSTOSCOPY WITH LITHOLAPAXY  10/30/2011   Procedure: CYSTOSCOPY WITH LITHOLAPAXY;  Surgeon: Claybon Jabs, MD;  Location: [facility];  Service: Urology;  Laterality: N/A;  .  EXTRACORPOREAL SHOCK WAVE LITHOTRIPSY  09-05-2010   RIGHT  . NESBIT PROCEDURE  10-17-2004   CORRECTION OF PENILE ANGULATION (PEYRONIES DISEASE)    FAMILY HISTORY: Family History  Problem Relation Age of Onset  . Stroke Mother   . Heart attack Mother   . Diabetes Mother   . Hypertension Mother   . Renal cancer Father   . Diabetes Sister   . Heart attack Brother   . Diabetes Son   . Heart attack Maternal Grandfather   . Heart attack Brother   . Bladder Cancer Brother     SOCIAL HISTORY:  Social History   Socioeconomic History  . Marital status: Married    Spouse name: Hassan Rowan  . Number of children: 2  . Years of education: Not on file  . Highest education level: Not on file  Social Needs  . Financial resource strain: Not on file  . Food insecurity - worry: Not on file  . Food insecurity - inability: Not on file  . Transportation needs - medical: Not on file  . Transportation needs - non-medical: Not on file  Occupational History  . Occupation: retired    Comment: self -employed  Tobacco Use  . Smoking status: Former Smoker    Packs/day: 1.00    Years: 7.00    Pack years: 7.00    Types: Cigarettes    Last attempt to quit: 09/26/1973    Years since quitting: 43.9  . Smokeless tobacco: Current User    Types: Chew  . Tobacco comment: CHEW TOBACCO FOR 35 YRS  Substance and Sexual Activity  . Alcohol use: No    Alcohol/week: 0.0 oz  . Drug use: No  . Sexual activity: Not on file  Other Topics Concern  . Not on file  Social History Narrative  . Not on file     PHYSICAL EXAM  Vitals:   08/22/17 0825  BP: 127/65  Pulse: 63  Resp: 16  Weight: 158 lb (71.7 kg)  Height: 5' 6"  (1.676 m)    Body mass index is 25.5 kg/m.   General: The patient is well-developed and well-nourished and in no acute distress  Neurologic Exam  Mental status: The patient is alert and oriented x 3 at the time of the examination. The patient has apparent normal recent and  remote memory, with an apparently normal attention span and concentration ability.   Speech is normal.  Cranial nerves: Extraocular movements are full.   There is good facial sensation to soft touch bilaterally.Facial strength is normal.  Trapezius and sternocleidomastoid strength is normal. No dysarthria is noted.  The tongue is midline, and the patient has symmetric elevation of the soft palate. No obvious hearing deficits are noted.  Motor:  Muscle bulk is normal.   Muscle tone is increased in his legs, right greater than left. The strength was 2/5 in the right and 4/5 in the left hip flexures. Strength was 3/5 in the right quad and 4+/5 on the left. Distally strength was 4 minus in the right leg and 4 in the left leg. Strength was 5/5 in the arms.     Sensory: Sensory testing is intact to touch and vibration sensation in all 4 extremities.  Coordination: Cerebellar testing shows good finger-nose-finger. He has reduced heel-to-shin on the left but is unable to do on the right. .    Gait and station: Station is normal.   His gait is spastic  He has a right foot drop (uses AFO) . The knee hyperextends a little bit on the right as he walks. He is  unable to tandem gait.     Romberg is negative  Reflexes:  Deep tendon reflexes were normal in the arms. There are increased in the legs and he has spread at the right knee.     DIAGNOSTIC DATA (LABS, IMAGING, TESTING) - I reviewed patient records, labs, notes, testing and imaging myself where available.  Lab Results  Component Value Date   WBC 3.9 05/17/2017   HGB 10.9 (L) 05/17/2017   HCT 33.1 (L) 05/17/2017   MCV 85 05/17/2017   PLT 147 (L) 05/17/2017      Component Value Date/Time   NA 139 05/17/2017 1132   K 4.2 05/17/2017 1132   CL 104 05/17/2017 1132   CO2 20 05/17/2017 1132   GLUCOSE 127 (H) 05/17/2017 1132   GLUCOSE 130 (H) 01/06/2013 1311   BUN 20 05/17/2017 1132   CREATININE 1.03 05/17/2017 1132   CREATININE 1.13 01/06/2013  1311   CALCIUM 9.4 05/17/2017 1132   PROT 6.9 05/17/2017 1132   ALBUMIN 4.4 05/17/2017 1132   AST 22 05/17/2017 1132   ALT 17 05/17/2017 1132   ALKPHOS 61 05/17/2017 1132   BILITOT 0.3 05/17/2017 1132   GFRNONAA 75 05/17/2017 1132   GFRNONAA 69 01/06/2013 1311   GFRAA 86 05/17/2017 1132   GFRAA 80 01/06/2013 1311   Lab Results  Component Value Date   CHOL 118 05/17/2017   HDL 29 (L) 05/17/2017   LDLCALC 72 05/17/2017   TRIG 86 05/17/2017   CHOLHDL 4.1 05/17/2017   Lab Results  Component Value Date   HGBA1C 6.9 07/05/2015        ASSESSMENT AND PLAN  Multiple sclerosis ([place]) - Plan: CBC with Differential/Platelet  Urinary dysfunction  Spastic gait  Other fatigue   1.   In the past, ocrelizumab was discussed with the patient as he could have primary progressive MS (vs SPMS). However, he prefers to stay on the Betaseron at this time.     2.   Continue tamsulosin for bladder.  Trial of desmopressin 0.1 mg nightly. 3.   We discussed using his walker more than the cane as I believe it will be safer.  4.  He will return to see me in 6 months or sooner if he has new or worsening neurologic symptoms.   Jodee Wagenaar A. Felecia Shelling, MD, PhD 03/17/9165, 0:60 AM Certified in Neurology, Clinical Neurophysiology, Sleep Medicine, Pain Medicine and Neuroimaging  [facility] [address], [place], [place] [address] [phone]

## 2017-08-23 ENCOUNTER — Other Ambulatory Visit: Payer: Self-pay | Admitting: Family Medicine

## 2017-08-23 LAB — CBC WITH DIFFERENTIAL/PLATELET
Basophils Absolute: 0 10*3/uL (ref 0.0–0.2)
Basos: 0 %
EOS (ABSOLUTE): 0.1 10*3/uL (ref 0.0–0.4)
EOS: 2 %
HEMATOCRIT: 37.9 % (ref 37.5–51.0)
Hemoglobin: 11.6 g/dL — ABNORMAL LOW (ref 13.0–17.7)
IMMATURE GRANULOCYTES: 0 %
Immature Grans (Abs): 0 10*3/uL (ref 0.0–0.1)
Lymphocytes Absolute: 0.9 10*3/uL (ref 0.7–3.1)
Lymphs: 15 %
MCH: 27.2 pg (ref 26.6–33.0)
MCHC: 30.6 g/dL — ABNORMAL LOW (ref 31.5–35.7)
MCV: 89 fL (ref 79–97)
MONOS ABS: 0.5 10*3/uL (ref 0.1–0.9)
Monocytes: 7 %
Neutrophils Absolute: 4.8 10*3/uL (ref 1.4–7.0)
Neutrophils: 76 %
PLATELETS: 197 10*3/uL (ref 150–379)
RBC: 4.27 x10E6/uL (ref 4.14–5.80)
RDW: 15.6 % — AB (ref 12.3–15.4)
WBC: 6.3 10*3/uL (ref 3.4–10.8)

## 2017-08-24 ENCOUNTER — Telehealth: Payer: Self-pay | Admitting: *Deleted

## 2017-08-24 NOTE — Telephone Encounter (Signed)
Spoke with Jacob Fowler and reviewed below lab results. He verbalized understanding of same/fim

## 2017-08-24 NOTE — Telephone Encounter (Signed)
-----   Message from Britt Bottom, MD sent at 08/23/2017  4:43 PM EST ----- Please let the patient know that the lab work is fine.

## 2017-08-30 DIAGNOSIS — Z87442 Personal history of urinary calculi: Secondary | ICD-10-CM | POA: Diagnosis not present

## 2017-08-30 DIAGNOSIS — E039 Hypothyroidism, unspecified: Secondary | ICD-10-CM | POA: Diagnosis not present

## 2017-08-30 DIAGNOSIS — K449 Diaphragmatic hernia without obstruction or gangrene: Secondary | ICD-10-CM | POA: Diagnosis not present

## 2017-08-30 DIAGNOSIS — H25811 Combined forms of age-related cataract, right eye: Secondary | ICD-10-CM | POA: Diagnosis not present

## 2017-08-30 DIAGNOSIS — Z961 Presence of intraocular lens: Secondary | ICD-10-CM | POA: Diagnosis not present

## 2017-08-30 DIAGNOSIS — I10 Essential (primary) hypertension: Secondary | ICD-10-CM | POA: Diagnosis not present

## 2017-08-30 DIAGNOSIS — K219 Gastro-esophageal reflux disease without esophagitis: Secondary | ICD-10-CM | POA: Diagnosis not present

## 2017-08-30 DIAGNOSIS — Z79899 Other long term (current) drug therapy: Secondary | ICD-10-CM | POA: Diagnosis not present

## 2017-08-30 DIAGNOSIS — Z7984 Long term (current) use of oral hypoglycemic drugs: Secondary | ICD-10-CM | POA: Diagnosis not present

## 2017-08-30 DIAGNOSIS — E113293 Type 2 diabetes mellitus with mild nonproliferative diabetic retinopathy without macular edema, bilateral: Secondary | ICD-10-CM | POA: Diagnosis not present

## 2017-08-30 DIAGNOSIS — Z87891 Personal history of nicotine dependence: Secondary | ICD-10-CM | POA: Diagnosis not present

## 2017-08-30 DIAGNOSIS — G35 Multiple sclerosis: Secondary | ICD-10-CM | POA: Diagnosis not present

## 2017-08-30 DIAGNOSIS — H25813 Combined forms of age-related cataract, bilateral: Secondary | ICD-10-CM | POA: Diagnosis not present

## 2017-08-31 LAB — HM DIABETES EYE EXAM

## 2017-09-03 ENCOUNTER — Other Ambulatory Visit (INDEPENDENT_AMBULATORY_CARE_PROVIDER_SITE_OTHER): Payer: Self-pay

## 2017-09-03 DIAGNOSIS — Z0289 Encounter for other administrative examinations: Secondary | ICD-10-CM

## 2017-09-04 ENCOUNTER — Other Ambulatory Visit: Payer: Self-pay | Admitting: Family Medicine

## 2017-09-13 ENCOUNTER — Other Ambulatory Visit: Payer: Self-pay | Admitting: Family Medicine

## 2017-09-25 ENCOUNTER — Other Ambulatory Visit: Payer: Self-pay | Admitting: Family Medicine

## 2017-09-25 NOTE — Telephone Encounter (Signed)
Next Ov 10/04/17

## 2017-10-04 ENCOUNTER — Encounter: Payer: Self-pay | Admitting: Family Medicine

## 2017-10-04 ENCOUNTER — Ambulatory Visit: Payer: PPO | Admitting: Family Medicine

## 2017-10-04 VITALS — BP 103/57 | HR 64 | Temp 97.3°F | Ht 66.0 in | Wt 157.0 lb

## 2017-10-04 DIAGNOSIS — I700 Atherosclerosis of aorta: Secondary | ICD-10-CM

## 2017-10-04 DIAGNOSIS — I10 Essential (primary) hypertension: Secondary | ICD-10-CM

## 2017-10-04 DIAGNOSIS — D696 Thrombocytopenia, unspecified: Secondary | ICD-10-CM | POA: Diagnosis not present

## 2017-10-04 DIAGNOSIS — Z23 Encounter for immunization: Secondary | ICD-10-CM | POA: Diagnosis not present

## 2017-10-04 DIAGNOSIS — E785 Hyperlipidemia, unspecified: Secondary | ICD-10-CM | POA: Diagnosis not present

## 2017-10-04 DIAGNOSIS — D510 Vitamin B12 deficiency anemia due to intrinsic factor deficiency: Secondary | ICD-10-CM

## 2017-10-04 DIAGNOSIS — E119 Type 2 diabetes mellitus without complications: Secondary | ICD-10-CM | POA: Diagnosis not present

## 2017-10-04 DIAGNOSIS — E559 Vitamin D deficiency, unspecified: Secondary | ICD-10-CM | POA: Diagnosis not present

## 2017-10-04 DIAGNOSIS — G35 Multiple sclerosis: Secondary | ICD-10-CM

## 2017-10-04 DIAGNOSIS — L723 Sebaceous cyst: Secondary | ICD-10-CM | POA: Diagnosis not present

## 2017-10-04 DIAGNOSIS — I2510 Atherosclerotic heart disease of native coronary artery without angina pectoris: Secondary | ICD-10-CM

## 2017-10-04 DIAGNOSIS — D692 Other nonthrombocytopenic purpura: Secondary | ICD-10-CM | POA: Diagnosis not present

## 2017-10-04 DIAGNOSIS — E1169 Type 2 diabetes mellitus with other specified complication: Secondary | ICD-10-CM | POA: Diagnosis not present

## 2017-10-04 DIAGNOSIS — Z8679 Personal history of other diseases of the circulatory system: Secondary | ICD-10-CM

## 2017-10-04 DIAGNOSIS — N5201 Erectile dysfunction due to arterial insufficiency: Secondary | ICD-10-CM

## 2017-10-04 LAB — GLUCOSE HEMOCUE WAIVED: GLU HEMOCUE WAIVED: 105 mg/dL — AB (ref 65–99)

## 2017-10-04 LAB — BAYER DCA HB A1C WAIVED: HB A1C: 6.5 % (ref <7.0)

## 2017-10-04 MED ORDER — SULFAMETHOXAZOLE-TRIMETHOPRIM 800-160 MG PO TABS: 1.0000 | ORAL_TABLET | Freq: Two times a day (BID) | ORAL | 0 refills | Status: DC

## 2017-10-04 MED ORDER — AMLODIPINE BESYLATE 2.5 MG PO TABS: 2.5000 mg | ORAL_TABLET | Freq: Every morning | ORAL | 3 refills | Status: DC

## 2017-10-04 MED ORDER — BLOOD GLUCOSE METER KIT
PACK | 1 refills | Status: DC
Start: 2017-10-04 — End: 2020-07-28

## 2017-10-04 MED ORDER — SILDENAFIL CITRATE 100 MG PO TABS: 100.0000 mg | ORAL_TABLET | Freq: Every day | ORAL | 2 refills | Status: DC | PRN

## 2017-10-04 NOTE — Patient Instructions (Addendum)
Medicare Annual Wellness Visit  [facility] and the medical providers at [facility] strive to bring you the best medical care.  In doing so we not only want to address your current medical conditions and concerns but also to detect new conditions early and prevent illness, disease and health-related problems.    Medicare offers a yearly Wellness Visit which allows our clinical staff to assess your need for preventative services including immunizations, lifestyle education, counseling to decrease risk of preventable diseases and screening for fall risk and other medical concerns.    This visit is provided free of charge (no copay) for all Medicare recipients. The clinical pharmacists at [facility] have begun to conduct these Wellness Visits which will also include a thorough review of all your medications.    As you primary medical provider recommend that you make an appointment for your Annual Wellness Visit if you have not done so already this year.  You may set up this appointment before you leave today or you may call back ([phone]) and schedule an appointment.  Please make sure when you call that you mention that you are scheduling your Annual Wellness Visit with the clinical pharmacist so that the appointment may be made for the proper length of time.     Continue current medications. Continue good therapeutic lifestyle changes which include good diet and exercise. Fall precautions discussed with patient. If an FOBT was given today- please return it to our front desk. If you are over 68 years old - you may need Prevnar 6 or the adult Pneumonia vaccine.  **Flu shots are available--- please call and schedule a FLU-CLINIC appointment**  After your visit with [place] today you will receive a survey in the mail or online from [employer] regarding your care with [place]. Please take a moment to fill this out. Your feedback is very  important to [place] as you can help [place] better understand your patient needs as well as improve your experience and satisfaction. WE CARE ABOUT YOU!!!   The patient will continue to follow-up with his neurologist. We will check and see when your next colonoscopy is due with Dr. Watt Climes .  If you do not hear from [place] within the next week please call us back about a follow-up on when that visit is necessary. We will also make sure that you get an appointment this summer with the cardiologist for a 2-year follow-up with a strong 67-year follow-up with a strong 68-year history of heart disease We will call in a prescription for sildenafil to the pharmacy and [place] Take the antibiotic for the sebaceous cyst on your back Septra DS 1 twice daily for 2 weeks with food and have an appointment to come back and see [place] in a couple weeks for follow-up of the cyst.  If he gets worsehe should come back sooner.

## 2017-10-04 NOTE — Progress Notes (Signed)
Subjective:    [name], male    DOB: 09-19-1950, 67 y.o.   MRN: [id number]  HPI Pt here for follow up and management of chronic medical problems which includes diabetes, hyperlipidemia and hypertension. He is taking medication regularly.  The patient is stable and doing well with no specific complaints.  He does have MS.  He is followed regularly by the neurologist.  He also has on diabetes.  He will get his Pneumovax today.  He has had the Prevnar.  He has a history of diabetes thrombocytopenia and atherosclerosis of the aorta.  The patient has had cataract surgery on the right eye and says that he may need to have surgery on the left within the next 6-12 months.  He says that his blood sugars at home have been running between 130 and 200 and he is concerned that the strips may be old or the monitor may not be correct.  We will write him a prescription for a new monitor but will double check his all monitor to see what his sugars are with our meter compared to his.  He denies any chest pain pressure tightness or shortness of breath.  He denies any trouble with nausea vomiting or diarrhea but does have some occasional constipation and is concerned that this could be coming from his MS or some of the medicines that he is taking for his MS.  He will discuss this further with his neurologist when he sees him the next time.  We will also double check on the date of when his next colonoscopy is due and get back in touch with him on this.  The patient does give a history of a younger brother having a heart attack who was a smoker and an older brother who had a heart attack and died and was also a smoker.  This patient last saw the cardiologist a couple of years ago.  Actually, the last visit was in July 2017.  We will make sure the patient has another appointment sometime this summer for routine follow-up because of family history.     Patient Active Problem List   Diagnosis Date Noted  .  Thrombocytopenia ([place]) 05/18/2017  . Aortic atherosclerosis ([place]) 04/10/2016  . Rosacea 11/23/2015  . Urinary dysfunction 08/19/2015  . Right foot drop 02/17/2015  . Urinary disorder 02/17/2015  . Insomnia 02/17/2015  . Other fatigue 02/17/2015  . 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 Bladder disorder 09/16/2014  . Multiple sclerosis ([place]) 01/06/2013  . Ureteral calculus, right 10/30/2011  . Bladder calculus 10/30/2011  . Tobacco use 09/27/2011  . CORONARY ATHEROSCLEROSIS, NATIVE VESSEL 10/06/2009  . Diabetes mellitus type 2 in nonobese ([place]) 10/01/2009  . Hyperlipidemia associated with type 2 diabetes mellitus ([place]) 10/01/2009  . Essential hypertension 10/01/2009   Outpatient Encounter Medications as of 10/04/2017  Medication Sig  . amLODipine (NORVASC) 2.5 MG tablet TAKE ONE TABLET BY MOUTH EVERY MORNING  . aspirin (LONGS ADULT LOW STRENGTH ASA) 81 MG EC tablet Take 81 mg by mouth daily.   Marland Kitchen BETASERON 0.3 MG KIT injection Inject 0.25 mg into the skin every other day.  . cholecalciferol (VITAMIN D) 1000 UNITS tablet Take 1,000 Units by mouth daily.  . cyanocobalamin (,VITAMIN B-12,) 1000 MCG/ML injection INJECT 1 MILLILITER INTO THE SKIN ONCE AMONTH  . desmopressin (DDAVP) 0.1 MG tablet Take 1 tablet (0.1 mg total) by mouth daily.  Marland Kitchen doxycycline (VIBRA-TABS) 100 MG tablet TAKE ONE TABLET BY MOUTH EVERY  DAY  . fish oil-omega-3 fatty acids 1000 MG capsule Take 2 g by mouth 2 (two) times daily.  Marland Kitchen glucose blood test strip Use as instructed  . isosorbide mononitrate (IMDUR) 60 MG 24 hr tablet TAKE 1/2 TABLET BY MOUTH EVERY DAY  . levothyroxine (SYNTHROID, LEVOTHROID) 50 MCG tablet TAKE ONE TABLET BY MOUTH EVERY DAY BEFORE BREAKFAST  . lisinopril (PRINIVIL,ZESTRIL) 10 MG tablet TAKE ONE TABLET BY MOUTH EVERY DAY  . metFORMIN (GLUCOPHAGE) 500 MG tablet TAKE TWO TABLETS BY MOUTH TWICE DAILY WITH MEALS  . omeprazole (PRILOSEC) 20 MG capsule Take 20 mg by mouth daily.  . pioglitazone (ACTOS) 30 MG  tablet TAKE ONE TABLET BY MOUTH EVERY DAY  . rosuvastatin (CRESTOR) 20 MG tablet TAKE ONE TABLET BY MOUTH EVERY DAY  . tamsulosin (FLOMAX) 0.4 MG CAPS capsule Take 1 capsule (0.4 mg total) by mouth daily after supper.  . Tuberculin-Allergy Syringes (ALLERGY SYRINGE 1CC/27GX1/2") 27G X 1/2" 1 ML MISC Injection once monthly- B 12 shots  . [DISCONTINUED] atorvastatin (LIPITOR) 10 MG tablet Take 10 mg by mouth daily.    . [DISCONTINUED] niacin-simvastatin (SIMCOR) 500-20 MG 24 hr tablet Take 1 tablet by mouth at bedtime.     Facility-Administered Encounter Medications as of 10/04/2017  Medication  . cyanocobalamin ((VITAMIN B-12)) injection 1,000 mcg     Review of Systems  Constitutional: Negative.   HENT: Negative.   Eyes: Negative.   Respiratory: Negative.   Cardiovascular: Negative.   Gastrointestinal: Negative.   Endocrine: Negative.   Genitourinary: Negative.   Musculoskeletal: Negative.   Skin: Negative.   Allergic/Immunologic: Negative.   Neurological: Negative.   Hematological: Negative.   Psychiatric/Behavioral: Negative.        Objective:   Physical Exam  Constitutional: He is oriented to person, place, and time. He appears well-developed and well-nourished. No distress.  The patient is pleasant and alert despite his inability to walk well and use a cane with his MS.  HENT:  Head: Normocephalic and atraumatic.  Right Ear: External ear normal.  Left Ear: External ear normal.  Nose: Nose normal.  Mouth/Throat: Oropharynx is clear and moist. No oropharyngeal exudate.  Eyes: Pupils are equal, round, and reactive to light. Conjunctivae and EOM are normal. Right eye exhibits no discharge. Left eye exhibits no discharge. No scleral icterus.  Recent right cataract with left cataract being planned in the next 6 months to 1 year.  Neck: Normal range of motion. Neck supple. No thyromegaly present.  No bruits thyromegaly or anterior cervical adenopathy  Cardiovascular: Normal  rate, regular rhythm, normal heart sounds and intact distal pulses.  No murmur heard. The heart is regular at 72/min  Pulmonary/Chest: Effort normal and breath sounds normal. No respiratory distress. He has no wheezes. He has no rales. He exhibits no tenderness.  Clear anteriorly and posteriorly with no axillary adenopathy  Abdominal: Soft. Bowel sounds are normal. He exhibits no mass. There is no tenderness. There is no rebound and no guarding.  No abdominal tenderness masses organ enlargement or bruits  Musculoskeletal: He exhibits deformity. He exhibits no edema or tenderness.  Right foot deformity with brace because of MS  Lymphadenopathy:    He has no cervical adenopathy.  Neurological: He is alert and oriented to person, place, and time. He has normal reflexes. No cranial nerve deficit.  Skin: Skin is warm and dry. No rash noted.  Draining sebaceous cyst of back to the left of the thoracic spine minimal redness.  Patient has only been  taking a low dose of doxycycline for his rosacea.  Psychiatric: He has a normal mood and affect. His behavior is normal. Judgment and thought content normal.  Nursing note and vitals reviewed.  BP (!) 103/57 (BP Location: Left Arm)   Pulse 64   Temp (!) 97.3 F (36.3 C) (Oral)   Ht _0  (1.676 m)   Wt 157 lb (71.2 kg)   BMI 25.34 kg/m         Assessment & Plan:  1. Essential hypertension -The blood pressure is good he will continue with current treatment - BMP8+EGFR - CBC with Differential/Platelet - Hepatic function panel  2. Diabetes mellitus type 2 in nonobese [facility]) -Continue with current treatment pending results of lab work - BMP8+EGFR - CBC with Differential/Platelet - [facility] [place] Hb A1c Waived  3. Hyperlipidemia associated with type 2 diabetes mellitus ([place]) -Continue with current treatment and as aggressive therapeutic lifestyle changes as possible contingent on lab work results - CBC with Differential/Platelet - Lipid  panel  4. Vitamin D deficiency -Continue with vitamin D replacement - CBC with Differential/Platelet - VITAMIN D 25 Hydroxy (Vit-D Deficiency, Fractures)  5. Atherosclerosis of native coronary artery of native heart without angina pectoris -Follow-up with cardiology sometime this summer for routine follow-up and continue with aggressive therapeutic lifestyle changes - CBC with Differential/Platelet  6. Aortic atherosclerosis ([facility]) -Continue with statin therapy and aggressive therapeutic lifestyle changes - CBC with Differential/Platelet - Lipid panel  7. Senile purpura ([facility]) -No purpura were seen on his skin today.  8. Multiple sclerosis ([place]) -Continue to follow-up with neurology  9. Vitamin B12 deficiency anemia due to intrinsic factor deficiency -Continue with B12 injections  10. Thrombocytopenia ([place])  11. Erectile dysfunction due to arterial insufficiency -Generic sildenafil 100 mg #12 with as needed refills  12. Sebaceous cyst -Septra DS 1 twice daily with food for 2 weeks, hold the doxycycline during this time  13. History of heart disease -Follow-up with cardiology as planned  Patient Instructions                       Medicare Annual Wellness Visit  [facility] and the medical providers at [facility] strive to bring you the best medical care.  In doing so we not only want to address your current medical conditions and concerns but also to detect new conditions early and prevent illness, disease and health-related problems.    Medicare offers a yearly Wellness Visit which allows our clinical staff to assess your need for preventative services including immunizations, lifestyle education, counseling to decrease risk of preventable diseases and screening for fall risk and other medical concerns.    This visit is provided free of charge (no copay) for all Medicare recipients. The clinical pharmacists at [facility] have  begun to conduct these Wellness Visits which will also include a thorough review of all your medications.    As you primary medical provider recommend that you make an appointment for your Annual Wellness Visit if you have not done so already this year.  You may set up this appointment before you leave today or you may call back ([phone]) and schedule an appointment.  Please make sure when you call that you mention that you are scheduling your Annual Wellness Visit with the clinical pharmacist so that the appointment may be made for the proper length of time.     Continue current medications. Continue good therapeutic lifestyle changes which  include good diet and exercise. Fall precautions discussed with patient. If an FOBT was given today- please return it to our front desk. If you are over 66 years old - you may need Prevnar 57 or the adult Pneumonia vaccine.  **Flu shots are available--- please call and schedule a FLU-CLINIC appointment**  After your visit with [place] today you will receive a survey in the mail or online from [employer] regarding your care with [place]. Please take a moment to fill this out. Your feedback is very important to [place] as you can help [place] better understand your patient needs as well as improve your experience and satisfaction. WE CARE ABOUT YOU!!!   The patient will continue to follow-up with his neurologist. We will check and see when your next colonoscopy is due with Dr. Watt Climes .  If you do not hear from [place] within the next week please call us back about a follow-up on when that visit is necessary. We will also make sure that you get an appointment this summer with the cardiologist for a 2-year follow-up with a strong family history of heart disease We will call in a prescription for sildenafil to the pharmacy and [place] Take the antibiotic for the sebaceous cyst on your back Septra DS 1 twice daily for 2 weeks with food and have an appointment to come back and see [place] in  a couple weeks for follow-up of the cyst.  If he gets worsehe should come back sooner.   Arrie Senate MD

## 2017-10-04 NOTE — Addendum Note (Signed)
Addended by: Zannie Cove on: 10/04/2017 02:20 PM   Modules accepted: Orders

## 2017-10-05 LAB — CBC WITH DIFFERENTIAL/PLATELET
BASOS ABS: 0 10*3/uL (ref 0.0–0.2)
Basos: 0 %
EOS (ABSOLUTE): 0.1 10*3/uL (ref 0.0–0.4)
Eos: 1 %
HEMOGLOBIN: 11.4 g/dL — AB (ref 13.0–17.7)
Hematocrit: 33.5 % — ABNORMAL LOW (ref 37.5–51.0)
IMMATURE GRANS (ABS): 0 10*3/uL (ref 0.0–0.1)
IMMATURE GRANULOCYTES: 0 %
LYMPHS: 11 %
Lymphocytes Absolute: 0.6 10*3/uL — ABNORMAL LOW (ref 0.7–3.1)
MCH: 27.7 pg (ref 26.6–33.0)
MCHC: 34 g/dL (ref 31.5–35.7)
MCV: 81 fL (ref 79–97)
MONOCYTES: 12 %
Monocytes Absolute: 0.7 10*3/uL (ref 0.1–0.9)
NEUTROS ABS: 4.1 10*3/uL (ref 1.4–7.0)
NEUTROS PCT: 76 %
PLATELETS: 166 10*3/uL (ref 150–379)
RBC: 4.12 x10E6/uL — AB (ref 4.14–5.80)
RDW: 15.9 % — ABNORMAL HIGH (ref 12.3–15.4)
WBC: 5.5 10*3/uL (ref 3.4–10.8)

## 2017-10-05 LAB — BMP8+EGFR
BUN/Creatinine Ratio: 23 (ref 10–24)
BUN: 26 mg/dL (ref 8–27)
CALCIUM: 10.1 mg/dL (ref 8.6–10.2)
CHLORIDE: 107 mmol/L — AB (ref 96–106)
CO2: 22 mmol/L (ref 20–29)
Creatinine, Ser: 1.13 mg/dL (ref 0.76–1.27)
GFR calc non Af Amer: 67 mL/min/{1.73_m2} (ref >59)
GFR, EST AFRICAN AMERICAN: 77 mL/min/{1.73_m2} (ref >59)
Glucose: 125 mg/dL — ABNORMAL HIGH (ref 65–99)
POTASSIUM: 4.5 mmol/L (ref 3.5–5.2)
Sodium: 144 mmol/L (ref 134–144)

## 2017-10-05 LAB — HEPATIC FUNCTION PANEL
ALK PHOS: 65 IU/L (ref 39–117)
ALT: 19 IU/L (ref 0–44)
AST: 25 IU/L (ref 0–40)
Albumin: 4.7 g/dL (ref 3.6–4.8)
BILIRUBIN, DIRECT: 0.12 mg/dL (ref 0.00–0.40)
Bilirubin Total: 0.3 mg/dL (ref 0.0–1.2)
TOTAL PROTEIN: 7.4 g/dL (ref 6.0–8.5)

## 2017-10-05 LAB — LIPID PANEL
CHOLESTEROL TOTAL: 126 mg/dL (ref 100–199)
Chol/HDL Ratio: 4.2 ratio (ref 0.0–5.0)
HDL: 30 mg/dL — AB (ref >39)
LDL CALC: 79 mg/dL (ref 0–99)
Triglycerides: 83 mg/dL (ref 0–149)
VLDL CHOLESTEROL CAL: 17 mg/dL (ref 5–40)

## 2017-10-05 LAB — VITAMIN D 25 HYDROXY (VIT D DEFICIENCY, FRACTURES): Vit D, 25-Hydroxy: 51.4 ng/mL (ref 30.0–100.0)

## 2017-10-22 ENCOUNTER — Ambulatory Visit (INDEPENDENT_AMBULATORY_CARE_PROVIDER_SITE_OTHER): Payer: PPO | Admitting: Family Medicine

## 2017-10-22 ENCOUNTER — Encounter: Payer: Self-pay | Admitting: Family Medicine

## 2017-10-22 VITALS — BP 102/58 | HR 70 | Temp 97.1°F | Ht 66.0 in | Wt 158.0 lb

## 2017-10-22 DIAGNOSIS — L723 Sebaceous cyst: Secondary | ICD-10-CM

## 2017-10-22 MED ORDER — DOXYCYCLINE HYCLATE 100 MG PO TABS: 100.0000 mg | ORAL_TABLET | Freq: Two times a day (BID) | ORAL | 0 refills | Status: DC

## 2017-10-22 NOTE — Progress Notes (Signed)
Subjective:    [name], male    DOB: [birth year], 67 y.o.   MRN: [id number]  HPI Patient here today for 2 week follow up on sebaceous cyst on his back.  Wife has been changes the dressing on this daily and says it continues to drain some.  The patient says he is not having any pain at all with this.     Patient Active Problem List   Diagnosis Date Noted  . Thrombocytopenia ([place]) 05/18/2017  . Aortic atherosclerosis ([place]) 04/10/2016  . Rosacea 11/23/2015  . Urinary dysfunction 08/19/2015  . Right foot drop 02/17/2015  . Urinary disorder 02/17/2015  . Insomnia 02/17/2015  . Other fatigue 02/17/2015  . 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 Bladder disorder 09/16/2014  . Multiple sclerosis ([place]) 01/06/2013  . Ureteral calculus, right 10/30/2011  . Bladder calculus 10/30/2011  . Tobacco use 09/27/2011  . CORONARY ATHEROSCLEROSIS, NATIVE VESSEL 10/06/2009  . Diabetes mellitus type 2 in nonobese ([place]) 10/01/2009  . Hyperlipidemia associated with type 2 diabetes mellitus ([place]) 10/01/2009  . Essential hypertension 10/01/2009   Outpatient Encounter Medications as of 10/22/2017  Medication Sig  . amLODipine (NORVASC) 2.5 MG tablet Take 1 tablet (2.5 mg total) by mouth every morning.  Marland Kitchen aspirin (LONGS ADULT LOW STRENGTH ASA) 81 MG EC tablet Take 81 mg by mouth daily.   Marland Kitchen BETASERON 0.3 MG KIT injection Inject 0.25 mg into the skin every other day.  . blood glucose meter kit and supplies Dispense based on patient and insurance preference. Check BS TID and PRN  (FOR ICD dx E11.9).  . cholecalciferol (VITAMIN D) 1000 UNITS tablet Take 1,000 Units by mouth daily.  . cyanocobalamin (,VITAMIN B-12,) 1000 MCG/ML injection INJECT 1 MILLILITER INTO THE SKIN ONCE AMONTH  . desmopressin (DDAVP) 0.1 MG tablet Take 1 tablet (0.1 mg total) by mouth daily.  Marland Kitchen doxycycline (VIBRA-TABS) 100 MG tablet TAKE ONE TABLET BY MOUTH EVERY DAY  . fish oil-omega-3 fatty acids 1000 MG capsule Take 2 g by  mouth 2 (two) times daily.  Marland Kitchen glucose blood test strip Use as instructed  . isosorbide mononitrate (IMDUR) 60 MG 24 hr tablet TAKE 1/2 TABLET BY MOUTH EVERY DAY  . levothyroxine (SYNTHROID, LEVOTHROID) 50 MCG tablet TAKE ONE TABLET BY MOUTH EVERY DAY BEFORE BREAKFAST  . lisinopril (PRINIVIL,ZESTRIL) 10 MG tablet TAKE ONE TABLET BY MOUTH EVERY DAY  . metFORMIN (GLUCOPHAGE) 500 MG tablet TAKE TWO TABLETS BY MOUTH TWICE DAILY WITH MEALS  . omeprazole (PRILOSEC) 20 MG capsule Take 20 mg by mouth daily.  . pioglitazone (ACTOS) 30 MG tablet TAKE ONE TABLET BY MOUTH EVERY DAY  . rosuvastatin (CRESTOR) 20 MG tablet TAKE ONE TABLET BY MOUTH EVERY DAY  . sildenafil (VIAGRA) 100 MG tablet Take 1 tablet (100 mg total) by mouth daily as needed for erectile dysfunction.  . tamsulosin (FLOMAX) 0.4 MG CAPS capsule Take 1 capsule (0.4 mg total) by mouth daily after supper.  . Tuberculin-Allergy Syringes (ALLERGY SYRINGE 1CC/27GX1/2") 27G X 1/2" 1 ML MISC Injection once monthly- B 12 shots  . [DISCONTINUED] sulfamethoxazole-trimethoprim (BACTRIM DS) 800-160 MG tablet Take 1 tablet by mouth 2 (two) times daily.  . [DISCONTINUED] atorvastatin (LIPITOR) 10 MG tablet Take 10 mg by mouth daily.    . [DISCONTINUED] niacin-simvastatin (SIMCOR) 500-20 MG 24 hr tablet Take 1 tablet by mouth at bedtime.     Facility-Administered Encounter Medications as of 10/22/2017  Medication  . cyanocobalamin ((VITAMIN B-12)) injection 1,000 mcg  Review of Systems  Constitutional: Negative.   HENT: Negative.   Eyes: Negative.   Respiratory: Negative.   Cardiovascular: Negative.   Gastrointestinal: Negative.   Endocrine: Negative.   Genitourinary: Negative.   Musculoskeletal: Negative.   Skin: Negative.        Cyst on back - still draining  Allergic/Immunologic: Negative.   Neurological: Negative.   Hematological: Negative.   Psychiatric/Behavioral: Negative.        Objective:   Physical Exam  Constitutional: He is  oriented to person, place, and time. He appears well-developed and well-nourished. No distress.  HENT:  Head: Normocephalic.  Eyes: Pupils are equal, round, and reactive to light. Conjunctivae and EOM are normal. Right eye exhibits no discharge. Left eye exhibits no discharge. No scleral icterus.  Neck: Normal range of motion.  Neurological: He is alert and oriented to person, place, and time.  Skin: Skin is warm. No rash noted. No erythema. No pallor.  There is much less erythema and much less drainage and the lesion appears to be getting much smaller.  A culture and sensitivity was done today.  Since the patient did not tolerate the sulfa well because of constipation we will go back on the doxycycline but have him do 100 mg twice daily with food for 2 weeks he will call us in about 9 days for progress and if the lesion continues to drain we will still ask him to go see the general surgeon for possible incision and drainage.  Psychiatric: He has a normal mood and affect. His behavior is normal. Judgment and thought content normal.  Nursing note and vitals reviewed.   BP (!) 102/58 (BP Location: Left Arm)   Pulse 70   Temp (!) 97.1 F (36.2 C) (Oral)   Ht _0  (1.676 m)   Wt 158 lb (71.7 kg)   BMI 25.50 kg/m        Assessment & Plan:  1. Sebaceous cyst - Anaerobic and Aerobic Culture -Since sulfa was constipating, we will go back to doxycycline but do 100 mg twice daily for the next 2 weeks. -The patient will call us in about 9 days and at that time we will make a decision if it is still draining to go see the surgeon for further incision and drainage.  Meds ordered this encounter  Medications  . doxycycline (VIBRA-TABS) 100 MG tablet    Sig: Take 1 tablet (100 mg total) by mouth 2 (two) times daily. 1 po bid    Dispense:  30 tablet    Refill:  0   Patient Instructions  Since the sulfa constipated you, we will have you go back to doxycycline but have you take 100 mg twice daily  with food for about 2 more weeks. Call us in about 9 days and let us know if the drainage continues to improve and stops completely. If you continue to have drainage we will still need to send you to the surgeon for possible I&D.  Arrie Senate MD

## 2017-10-22 NOTE — Patient Instructions (Signed)
Since the sulfa constipated you, we will have you go back to doxycycline but have you take 100 mg twice daily with food for about 2 more weeks. Call us in about 9 days and let us know if the drainage continues to improve and stops completely. If you continue to have drainage we will still need to send you to the surgeon for possible I&D.

## 2017-10-23 ENCOUNTER — Other Ambulatory Visit: Payer: Self-pay | Admitting: Family Medicine

## 2017-10-26 LAB — ANAEROBIC AND AEROBIC CULTURE

## 2017-11-01 ENCOUNTER — Other Ambulatory Visit: Payer: Self-pay | Admitting: Family Medicine

## 2017-11-01 ENCOUNTER — Telehealth: Payer: Self-pay | Admitting: Family Medicine

## 2017-11-22 ENCOUNTER — Other Ambulatory Visit: Payer: Self-pay | Admitting: Family Medicine

## 2017-11-26 ENCOUNTER — Ambulatory Visit (INDEPENDENT_AMBULATORY_CARE_PROVIDER_SITE_OTHER): Payer: PPO | Admitting: Family Medicine

## 2017-11-26 ENCOUNTER — Encounter: Payer: Self-pay | Admitting: Family Medicine

## 2017-11-26 VITALS — BP 134/78 | HR 75 | Temp 97.1°F | Ht 66.0 in | Wt 155.0 lb

## 2017-11-26 DIAGNOSIS — L03116 Cellulitis of left lower limb: Secondary | ICD-10-CM

## 2017-11-26 MED ORDER — DOXYCYCLINE HYCLATE 100 MG PO TABS: 100.0000 mg | ORAL_TABLET | Freq: Every day | ORAL | 0 refills | Status: DC

## 2017-11-26 NOTE — Progress Notes (Signed)
BP 134/78   Pulse 75   Temp (!) 97.1 F (36.2 C) (Oral)   Ht 5\' 6"  (1.676 m)   Wt 155 lb (70.3 kg)   BMI 25.02 kg/m    Subjective:    Patient ID: MENELIK MCFARREN, male    DOB: December 29, 1950, 67 y.o.   MRN: [id number]  HPI: IMAD SHOSTAK is a 67 y.o. male presenting on 11/26/2017 for Redness and swelling in left lower leg (Thinks he may have been bitten by something)   HPI Left leg swelling and redness Patient comes in complaining of left leg swelling and redness that he thinks may have started from a bug bite on the inner aspect of his left leg.  He has swelling and redness around there and pain to palpation over the redness.  He denies any fevers or chills or redness or warmth anywhere else.  He does feel like he has little more swelling in the ankle than the tibial he has had before.  He has not used anything to try to make it better or worse to this point.  Relevant past medical, surgical, family and social history reviewed and updated as indicated. Interim medical history since our last visit reviewed. Allergies and medications reviewed and updated.  Review of Systems  Constitutional: Negative for chills and fever.  Respiratory: Negative for shortness of breath and wheezing.   Cardiovascular: Positive for leg swelling (Trace edema). Negative for chest pain.  Musculoskeletal: Negative for back pain and gait problem.  Skin: Positive for color change. Negative for rash.  All other systems reviewed and are negative.   Per HPI unless specifically indicated above        Objective:    BP 134/78   Pulse 75   Temp (!) 97.1 F (36.2 C) (Oral)   Ht 5\' 6"  (1.676 m)   Wt 155 lb (70.3 kg)   BMI 25.02 kg/m   Wt Readings from Last 3 Encounters:  11/26/17 155 lb (70.3 kg)  10/22/17 158 lb (71.7 kg)  10/04/17 157 lb (71.2 kg)    Physical Exam  Constitutional: He is oriented to person, place, and time. He appears well-developed and well-nourished. No distress.  Eyes:  Conjunctivae are normal. No scleral icterus.  Cardiovascular: Normal rate, regular rhythm, normal heart sounds and intact distal pulses.  No murmur heard. Pulmonary/Chest: Effort normal and breath sounds normal. No respiratory distress. He has no wheezes.  Musculoskeletal: Normal range of motion. He exhibits edema (Trace edema in left lower extremity).  Neurological: He is alert and oriented to person, place, and time. Coordination normal.  Skin: Skin is warm and dry. No rash noted. He is not diaphoretic. There is erythema (Warmth and tenderness).     Psychiatric: He has a normal mood and affect. His behavior is normal.  Nursing note and vitals reviewed.       Assessment & Plan:   Problem List Items Addressed This Visit    None    Visit Diagnoses    Left leg cellulitis    -  Primary   Relevant Medications   doxycycline (VIBRA-TABS) 100 MG tablet      Patient has a small patch of cellulitis and warmth on left lower inner leg, will give doxycycline and return as needed.  Follow up plan: Return if symptoms worsen or fail to improve.  Counseling provided for all of the vaccine components No orders of the defined types were placed in this encounter.   Caryl Pina, MD  [place] 11/26/2017, 11:16 AM

## 2017-11-28 ENCOUNTER — Other Ambulatory Visit: Payer: Self-pay | Admitting: Family Medicine

## 2017-11-28 DIAGNOSIS — L03116 Cellulitis of left lower limb: Secondary | ICD-10-CM

## 2017-12-14 ENCOUNTER — Other Ambulatory Visit: Payer: Self-pay | Admitting: Family Medicine

## 2017-12-21 ENCOUNTER — Other Ambulatory Visit: Payer: Self-pay | Admitting: Family Medicine

## 2017-12-27 ENCOUNTER — Other Ambulatory Visit: Payer: Self-pay | Admitting: Family Medicine

## 2017-12-28 MED ORDER — "SYRINGE/NEEDLE (DISP) 25G X 5/8"" 3 ML MISC": 0 refills | Status: DC

## 2017-12-31 DIAGNOSIS — D126 Benign neoplasm of colon, unspecified: Secondary | ICD-10-CM | POA: Diagnosis not present

## 2017-12-31 DIAGNOSIS — Z86010 Personal history of colonic polyps: Secondary | ICD-10-CM | POA: Diagnosis not present

## 2018-01-02 DIAGNOSIS — D126 Benign neoplasm of colon, unspecified: Secondary | ICD-10-CM | POA: Diagnosis not present

## 2018-01-21 ENCOUNTER — Other Ambulatory Visit: Payer: Self-pay | Admitting: Family Medicine

## 2018-01-29 ENCOUNTER — Other Ambulatory Visit: Payer: Self-pay | Admitting: Family Medicine

## 2018-01-29 DIAGNOSIS — L03116 Cellulitis of left lower limb: Secondary | ICD-10-CM

## 2018-01-31 ENCOUNTER — Other Ambulatory Visit: Payer: Self-pay | Admitting: Family Medicine

## 2018-01-31 DIAGNOSIS — L03116 Cellulitis of left lower limb: Secondary | ICD-10-CM

## 2018-02-05 ENCOUNTER — Other Ambulatory Visit: Payer: Self-pay | Admitting: Family Medicine

## 2018-02-15 ENCOUNTER — Other Ambulatory Visit: Payer: Self-pay | Admitting: Family Medicine

## 2018-02-15 NOTE — Telephone Encounter (Signed)
OV 03/13/18

## 2018-02-20 ENCOUNTER — Other Ambulatory Visit: Payer: Self-pay | Admitting: Nurse Practitioner

## 2018-02-22 ENCOUNTER — Other Ambulatory Visit: Payer: Self-pay | Admitting: Family Medicine

## 2018-02-25 ENCOUNTER — Ambulatory Visit: Payer: PPO | Admitting: Neurology

## 2018-02-25 ENCOUNTER — Other Ambulatory Visit: Payer: Self-pay

## 2018-02-25 ENCOUNTER — Encounter: Payer: Self-pay | Admitting: Neurology

## 2018-02-25 ENCOUNTER — Telehealth: Payer: Self-pay | Admitting: *Deleted

## 2018-02-25 DIAGNOSIS — G4700 Insomnia, unspecified: Secondary | ICD-10-CM

## 2018-02-25 DIAGNOSIS — R39198 Other difficulties with micturition: Secondary | ICD-10-CM

## 2018-02-25 DIAGNOSIS — G35 Multiple sclerosis: Secondary | ICD-10-CM | POA: Diagnosis not present

## 2018-02-25 DIAGNOSIS — R5383 Other fatigue: Secondary | ICD-10-CM

## 2018-02-25 DIAGNOSIS — M21371 Foot drop, right foot: Secondary | ICD-10-CM

## 2018-02-25 DIAGNOSIS — R261 Paralytic gait: Secondary | ICD-10-CM

## 2018-02-25 NOTE — Telephone Encounter (Signed)
Disability forms completed and mailed to [employer], in stamped addressed envelope provided by pt.  Copy sent to med. records to be scanned into Epic. Copy mailed to pt. for his records/fim

## 2018-02-25 NOTE — Progress Notes (Signed)
[facility]  PATIENT: Jacob Fowler DOB: April 30, 1951  REFERRING DOCTOR OR PCP:  Redge Gainer SOURCE: patient  _________________________________   HISTORICAL  CHIEF COMPLAINT:  Chief Complaint  Patient presents with  . Multiple Sclerosis    Sts. he continues to tolerate Betaseron well. Denies new complaints/fim    HISTORY OF PRESENT ILLNESS:  Jacob Fowler is a 67 year old man with multiple sclerosis.      Update 02/25/2018: He feels gait is doing worse with more stumbles and some minor falls.    No injuries.  He uses a walker at night in the house and the cane the rest of the day.   Using a walker is difficult on the ground at his house.  He does a lot of chores.  He takes care of some animals. Right leg is weak and spastic.    He feels the arm is doing well.      He feels bladder is doing well on tamsulosin and desmopressin  He notes fatigue every afternoons, worse when there is more heat.    He has insomnia twice a week with more trouble falling asleep.   Occasionally, he takes greater than one hour to sleep.    He feels mood is fine.      Cognition is doing ok.   He is on Betaseron and tolerates it well with no significant skin reactions and tolerable systemic reactions.   We have discussed other medications on several occasions (pills and IVs) he would like to stay on Betaseron.   Update 08/22/2017:    He feels he is doing fairly well. He continues on Betaseron and tolerates it well. His last CBC did show that the lymphocytes were reduced at 0.5. It was done 05/17/2017. He also notes that gait is a little worse. 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 He has had a couple falls. He uses an AFO on the right leg. Ampyra had not helped him in the past. Has more issues with his bladder. He is emptying better with the tamsulosin but he still has a lot of of urgency. He has nocturia about 5 times a night. Vision is fine.  He notes  some fatigue but this is no worse than typical. He has no problems with mood or change in cognition.   From 02/19/2017: He denies any major changes though does feel his gait has worsened.   Since the last visit he has had his MRIs and blood work.   MRI's 09/04/16 were unchanged form 2016.   He has a plaque to the left at C4-C5 and to the right at T1, unchanged. He also has plaques in the brain consistent with MS.   Two are located in the brainstem.  CBC/LFTs were fine 01/02/17.   MS   He feels stable.  He is on Betaseron and he tolerates it well. We have discussed ocrelizumab as it can work for relapsing remitting as well as primary progressive MS which appears to be more consistent with his disease course  Gait/strength/sensation: He has poor gait due to right >> left leg weakness and spasticity.   He has a right AFO and it helps his foot drop some. With the cane, he can walk long distances. He does not like the walker.   He falls 1-2 times a month.  He loses balance if he takes some steps backwards.     He denies any numbness in the legs  He denies any significant numbness, weakness or clumsiness in the arms.   He he notes some spasticity in the right leg but he denies denies painful  spasms.     He tried Ampyra in the past but it has not helped.     Bladder: He has urinary frequency,  urgency and urinary hesitancy. Tamsulosin helps.  Oxybutynin did not help much and Myrbetriq was expensive  Now he needs to urinate q 2-3 hours during the day and 0 to 1 at night.  He has not had any incontinence.     Vision: He denies any MS related visual problems. He wears glasses for correction.  Fatigue/sleep: He is active and gets outside every day. 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   His fatigue is more physical and cognitive and is present as the day goes on and with heat  Ampyra did not help. Amantadine did not help   He sually starts every day feeling well  rested.    He is sleeping well most nights.   He does not snore much and his wife has not noted any apnea or gasping.   Mood/cognition:   He denies depression or anxiety. He feels his short-term memory is mildly worse than last year but notes no other cognitive issue.   MS History:   He began to note significant difficulty with gait in January 2010.      He had some back x-rays. He was then referred to neurosurgery that noted that he had plaques in his spine c/w  MS. He had further testing including MRI of the brain and spinal tap. These were all consistent with multiple sclerosis and by November 2010 he had started Betaseron. He feels that he has been mostly stable over the past 5 years but has noted that his gait is a little worse than it was in 2010. About 2013, he started using a cane.  MRI data (Images personally reviewed at previous visit) MRI Images  09/04/16 were personally reviewed and compared.  I concur with the interpretations: IMPRESSION:  This MRI of the cervical spine with and without contrast shows the following: 1.    T2 hyperintense lesions within the spinal cord to the left adjacent to C4-C5 and to the right adjacent to T1, unchanged in appearance when compared to the previous MRI.   2.    Mild degenerative changes at C3-C4, C4-C5 and C6-C7 that did not lead to any nerve root compression. 3.    There is a normal enhancement pattern and there are no acute findings.   IMPRESSION:  This MRI of the brain with and without contrast shows the following: 1.    Multiple T2/FLAIR hyperintense foci in the periventricular, juxtacortical and deep white matter of the hemispheres and 2 foci in the brainstem in a pattern and configuration consistent with chronic demyelinating plaque associated with multiple sclerosis. None of the foci appears to be acute. When compared to the MRI dated 09/28/2014, there is no interval change. 2.   Mild to moderate cortical atrophy that has mildly progressed when  compared to the previous MRI. 3.    There are no acute findings and there is a normal enhancement pattern.  REVIEW OF SYSTEMS: Constitutional: No fevers, chills, sweats, or change in appetite.  He notes fatigue most afternoon.  He has occasional insomnia. Eyes: No visual changes, double vision, eye pain Ear, nose and throat: No hearing loss, ear pain, nasal congestion, sore  throat Cardiovascular: No chest pain, palpitations Respiratory: No shortness of breath at rest or with exertion.   No wheezes GastrointestinaI: No nausea, vomiting, diarrhea, abdominal pain, fecal incontinence Genitourinary: urinary frequency and hesitancy  .  No nocturia now Musculoskeletal: No neck pain, back pain Integumentary: He denies rash or skin lesions. Neurological: as above Psychiatric: No depression at this time.  No anxiety Endocrine: No palpitations, diaphoresis, change in appetite, change in weigh or increased thirst Hematologic/Lymphatic: No anemia, purpura, petechiae. Allergic/Immunologic: No itchy/runny eyes, nasal congestion, recent allergic reactions, rashes  ALLERGIES: No Known Allergies  HOME MEDICATIONS:  Current Outpatient Medications:  .  amLODipine (NORVASC) 2.5 MG tablet, Take 1 tablet (2.5 mg total) by mouth every morning., Disp: 90 tablet, Rfl: 3 .  aspirin (LONGS ADULT LOW STRENGTH ASA) 81 MG EC tablet, Take 81 mg by mouth daily. , Disp: , Rfl:  .  BETASERON 0.3 MG KIT injection, Inject 0.25 mg into the skin every other day., Disp: 1 kit, Rfl: 11 .  blood glucose meter kit and supplies, Dispense based on patient and insurance preference. Check BS TID and PRN  (FOR ICD dx E11.9)., Disp: 1 each, Rfl: 1 .  cholecalciferol (VITAMIN D) 1000 UNITS tablet, Take 1,000 Units by mouth daily., Disp: , Rfl:  .  cyanocobalamin (,VITAMIN B-12,) 1000 MCG/ML injection, INJECT 1 MILLILITER INTO THE SKIN ONCE AMONTH, Disp: 3 mL, Rfl: 3 .  desmopressin (DDAVP) 0.1 MG tablet, Take 1 tablet (0.1 mg  total) by mouth daily., Disp: 30 tablet, Rfl: 11 .  doxycycline (VIBRA-TABS) 100 MG tablet, Take 1 tablet (100 mg total) by mouth daily., Disp: 20 tablet, Rfl: 0 .  fish oil-omega-3 fatty acids 1000 MG capsule, Take 2 g by mouth 2 (two) times daily., Disp: , Rfl:  .  glucose blood test strip, Use as instructed, Disp: 100 each, Rfl: 12 .  isosorbide mononitrate (IMDUR) 60 MG 24 hr tablet, TAKE 1/2 TABLET BY MOUTH EVERY DAY, Disp: 45 tablet, Rfl: 0 .  levothyroxine (SYNTHROID, LEVOTHROID) 50 MCG tablet, TAKE ONE TABLET BY MOUTH EVERY DAY BEFORE BREAKFAST, Disp: 30 tablet, Rfl: 0 .  lisinopril (PRINIVIL,ZESTRIL) 10 MG tablet, TAKE ONE TABLET BY MOUTH EVERY DAY, Disp: 90 tablet, Rfl: 0 .  metFORMIN (GLUCOPHAGE) 500 MG tablet, TAKE TWO TABLETS BY MOUTH TWICE DAILY WITH MEALS. NEED TO SCHEDULE FOLLOWUP., Disp: 120 tablet, Rfl: 0 .  omeprazole (PRILOSEC) 20 MG capsule, Take 20 mg by mouth daily., Disp: , Rfl:  .  pioglitazone (ACTOS) 30 MG tablet, TAKE ONE TABLET BY MOUTH EVERY DAY, Disp: 90 tablet, Rfl: 0 .  rosuvastatin (CRESTOR) 20 MG tablet, TAKE ONE TABLET BY MOUTH EVERY DAY, Disp: 90 tablet, Rfl: 0 .  sildenafil (VIAGRA) 100 MG tablet, Take 1 tablet (100 mg total) by mouth daily as needed for erectile dysfunction., Disp: 12 tablet, Rfl: 2 .  SYRINGE-NEEDLE, DISP, 3 ML (B-D SYRINGE/NEEDLE 3CC/25GX5/8) 25G X 5/8" 3 ML MISC, Use to give once a month B12 injections, Disp: 12 each, Rfl: 0 .  tamsulosin (FLOMAX) 0.4 MG CAPS capsule, Take 1 capsule (0.4 mg total) by mouth daily after supper., Disp: 30 capsule, Rfl: 11 .  Tuberculin-Allergy Syringes (ALLERGY SYRINGE 1CC/27GX1/2") 27G X 1/2" 1 ML MISC, Injection once monthly- B 12 shots, Disp: 100 each, Rfl: 1  Current Facility-Administered Medications:  .  cyanocobalamin ((VITAMIN B-12)) injection 1,000 mcg, 1,000 mcg, Intramuscular, Q30 days, Chipper Herb, MD, 1,000 mcg at 03/02/15 1647  PAST MEDICAL HISTORY: Past Medical History:  Diagnosis Date  .  Acquired leg length discrepancy   . Bladder stone   . BPH associated with nocturia   . CAD (coronary artery disease) CARDIOLOGIST- DR HOCHREIN--  LAST VISIT 09-27-2011 IN [facility]   Non obstructive Cath 2010-  HX CORONARY SPASM 2004  . Cataract    bilateral   . GERD (gastroesophageal reflux disease)   . H/O hiatal hernia   . History of nephrolithiasis 2009  . Hx of adenomatous colonic polyps   . Hyperlipidemia   . Hypertension   . Movement disorder   . Multiple sclerosis ([place]) DX  10/10---  NEUROLOGIST  DR Delfino Lovett [facility] ([place])   RIGHT SIDE AFFECTED MORE W/ WEAKNESS- - USES CANE  . Neuromuscular disorder (HCC)    MS  . NIDDM (non-insulin dependent diabetes mellitus)   . Peyronie's disease   . RBBB   . Renal stone RIGHT  . Right ureteral stone   . Rosacea   . Vision abnormalities     PAST SURGICAL HISTORY: Past Surgical History:  Procedure Laterality Date  . CARDIAC CATHETERIZATION  02-26-2009 ---  DR Ida Rogue   NONOBSTRUCTIVE CAD/ 50% DISTAL RCA/ 30% LAD / NORMAL LVF  . CYSTOSCOPY W/ RETROGRADES  10/30/2011   Procedure: CYSTOSCOPY WITH RETROGRADE PYELOGRAM;  Surgeon: Claybon Jabs, MD;  Location: [facility];  Service: Urology;  Laterality: Right;  . CYSTOSCOPY WITH LITHOLAPAXY  10/30/2011   Procedure: CYSTOSCOPY WITH LITHOLAPAXY;  Surgeon: Claybon Jabs, MD;  Location: [facility];  Service: Urology;  Laterality: N/A;  . EXTRACORPOREAL SHOCK WAVE LITHOTRIPSY  09-05-2010   RIGHT  . NESBIT PROCEDURE  10-17-2004   CORRECTION OF PENILE ANGULATION (PEYRONIES DISEASE)    FAMILY HISTORY: Family History  Problem Relation Age of Onset  . Stroke Mother   . Heart attack Mother   . Diabetes Mother   . Hypertension Mother   . Renal cancer Father   . Diabetes Sister   . Heart attack Brother   . Diabetes Son   . Heart attack Maternal Grandfather   . Heart attack Brother   . Bladder Cancer Brother     SOCIAL HISTORY:  Social History    Socioeconomic History  . Marital status: Married    Spouse name: Hassan Rowan  . Number of children: 2  . Years of education: Not on file  . Highest education level: Not on file  Occupational History  . Occupation: retired    Comment: self -employed  Social Needs  . Financial resource strain: Not on file  . Food insecurity:    Worry: Not on file    Inability: Not on file  . Transportation needs:    Medical: Not on file    Non-medical: Not on file  Tobacco Use  . Smoking status: Former Smoker    Packs/day: 1.00    Years: 7.00    Pack years: 7.00    Types: Cigarettes    Last attempt to quit: 09/26/1973    Years since quitting: 44.4  . Smokeless tobacco: Current User    Types: Chew  . Tobacco comment: CHEW TOBACCO FOR 35 YRS  Substance and Sexual Activity  . Alcohol use: No    Alcohol/week: 0.0 standard drinks  . Drug use: No  . Sexual activity: Not on file  Lifestyle  . Physical activity:    Days per week: Not on file    Minutes per session: Not on file  . Stress: Not on file  Relationships  .  Social connections:    Talks on phone: Not on file    Gets together: Not on file    Attends religious service: Not on file    Active member of club or organization: Not on file    Attends meetings of clubs or organizations: Not on file    Relationship status: Not on file  . Intimate partner violence:    Fear of current or ex partner: Not on file    Emotionally abused: Not on file    Physically abused: Not on file    Forced sexual activity: Not on file  Other Topics Concern  . Not on file  Social History Narrative  . Not on file     PHYSICAL EXAM  There were no vitals filed for this visit.  There is no height or weight on file to calculate BMI.   General: The patient is well-developed and well-nourished and in no acute distress  Neurologic Exam  Mental status: The patient is alert and oriented x 3 at the time of the examination. The patient has apparent normal  recent and remote memory, with an apparently normal attention span and concentration ability.   Speech is normal.  Cranial nerves: Extraocular movements are full.   There is good facial sensation to soft touch bilaterally.Facial strength is normal.  Trapezius and sternocleidomastoid strength is normal. No dysarthria is noted.  The tongue is midline, and the patient has symmetric elevation of the soft palate. No obvious hearing deficits are noted.  Motor:  Muscle bulk is normal.   There is increased muscle tone in the legs, right greater than left.  The strength was 2/5 in the right and 4/5 in the left hip flexures. Strength was 3/5 in the right quad and 4+/5 on the left. Distally strength was 4 minus in the right leg and 4 in the left leg. Strength was 5/5 in the arms.     Sensory: Sensory testing is intact to touch and vibration sensation in all 4 extremities.  Coordination: Cerebellar testing shows good finger-nose-finger. He has reduced heel-to-shin on the left but is unable to do on the right. .    Gait and station: Station is normal.  Gait is spastic with a right foot drop.  He uses an AFO.  There is also weakness at the knee.  The right leg is externally rotated as he walks and he has knee hyperextension.Marland Kitchen   He is unable to tandem gait.     Romberg is negative  Reflexes:  Deep tendon reflexes were normal in the arms.  DTRs are increased in the legs with crossed abductor responses at the right knee     DIAGNOSTIC DATA (LABS, IMAGING, TESTING) - I reviewed patient records, labs, notes, testing and imaging myself where available.  Lab Results  Component Value Date   WBC 5.5 10/04/2017   HGB 11.4 (L) 10/04/2017   HCT 33.5 (L) 10/04/2017   MCV 81 10/04/2017   PLT 166 10/04/2017      Component Value Date/Time   NA 144 10/04/2017 1113   K 4.5 10/04/2017 1113   CL 107 (H) 10/04/2017 1113   CO2 22 10/04/2017 1113   GLUCOSE 125 (H) 10/04/2017 1113   GLUCOSE 130 (H) 01/06/2013 1311    BUN 26 10/04/2017 1113   CREATININE 1.13 10/04/2017 1113   CREATININE 1.13 01/06/2013 1311   CALCIUM 10.1 10/04/2017 1113   PROT 7.4 10/04/2017 1113   ALBUMIN 4.7 10/04/2017 1113   AST 25 10/04/2017  1113   ALT 19 10/04/2017 1113   ALKPHOS 65 10/04/2017 1113   BILITOT 0.3 10/04/2017 1113   GFRNONAA 67 10/04/2017 1113   GFRNONAA 69 01/06/2013 1311   GFRAA 77 10/04/2017 1113   GFRAA 80 01/06/2013 1311   Lab Results  Component Value Date   CHOL 126 10/04/2017   HDL 30 (L) 10/04/2017   LDLCALC 79 10/04/2017   TRIG 83 10/04/2017   CHOLHDL 4.2 10/04/2017   Lab Results  Component Value Date   HGBA1C 6.9 07/05/2015        ASSESSMENT AND PLAN  Multiple sclerosis (HCC)  Urinary dysfunction  Spastic gait  Right foot drop  Insomnia, unspecified type  Other fatigue   1.   He would like to stay in the Betaseron.  It remains unclear if he has an active form of secondary progressive MS versus primary progressive MS.  We have discussed other treatments including Ocrevus.   2.   He will continue the medications at bedtime.   He does not want to take an additional medication for nighttime spasticity or insomnia. 3.   We discussed using a walker with wheels inside the house and consider getting a walker without wheels for outside. 4.  He will return to see me in 6 months or sooner if he has new or worsening neurologic symptoms.   Briza Bark A. Felecia Shelling, MD, PhD 04/09/2682, 41:96 AM Certified in Neurology, Clinical Neurophysiology, Sleep Medicine, Pain Medicine and Neuroimaging  [facility] [address] [place], [place] [address] [phone]

## 2018-03-13 ENCOUNTER — Ambulatory Visit (INDEPENDENT_AMBULATORY_CARE_PROVIDER_SITE_OTHER): Payer: PPO | Admitting: Family Medicine

## 2018-03-13 ENCOUNTER — Ambulatory Visit (INDEPENDENT_AMBULATORY_CARE_PROVIDER_SITE_OTHER): Payer: PPO

## 2018-03-13 ENCOUNTER — Encounter: Payer: Self-pay | Admitting: Family Medicine

## 2018-03-13 VITALS — BP 113/65 | HR 68 | Temp 97.4°F | Ht 66.0 in | Wt 161.0 lb

## 2018-03-13 DIAGNOSIS — G35 Multiple sclerosis: Secondary | ICD-10-CM | POA: Diagnosis not present

## 2018-03-13 DIAGNOSIS — I10 Essential (primary) hypertension: Secondary | ICD-10-CM

## 2018-03-13 DIAGNOSIS — E785 Hyperlipidemia, unspecified: Secondary | ICD-10-CM

## 2018-03-13 DIAGNOSIS — E1169 Type 2 diabetes mellitus with other specified complication: Secondary | ICD-10-CM | POA: Diagnosis not present

## 2018-03-13 DIAGNOSIS — E559 Vitamin D deficiency, unspecified: Secondary | ICD-10-CM | POA: Diagnosis not present

## 2018-03-13 DIAGNOSIS — D510 Vitamin B12 deficiency anemia due to intrinsic factor deficiency: Secondary | ICD-10-CM | POA: Diagnosis not present

## 2018-03-13 DIAGNOSIS — D696 Thrombocytopenia, unspecified: Secondary | ICD-10-CM | POA: Diagnosis not present

## 2018-03-13 DIAGNOSIS — I700 Atherosclerosis of aorta: Secondary | ICD-10-CM

## 2018-03-13 DIAGNOSIS — I2510 Atherosclerotic heart disease of native coronary artery without angina pectoris: Secondary | ICD-10-CM

## 2018-03-13 DIAGNOSIS — L03116 Cellulitis of left lower limb: Secondary | ICD-10-CM | POA: Diagnosis not present

## 2018-03-13 DIAGNOSIS — D692 Other nonthrombocytopenic purpura: Secondary | ICD-10-CM | POA: Diagnosis not present

## 2018-03-13 DIAGNOSIS — K5901 Slow transit constipation: Secondary | ICD-10-CM

## 2018-03-13 DIAGNOSIS — E119 Type 2 diabetes mellitus without complications: Secondary | ICD-10-CM

## 2018-03-13 LAB — BAYER DCA HB A1C WAIVED: HB A1C (BAYER DCA - WAIVED): 7 % — ABNORMAL HIGH (ref <7.0)

## 2018-03-13 MED ORDER — LUBIPROSTONE 24 MCG PO CAPS: 24.0000 ug | ORAL_CAPSULE | Freq: Every day | ORAL | 2 refills | Status: DC

## 2018-03-13 MED ORDER — DOXYCYCLINE HYCLATE 100 MG PO TABS: 100.0000 mg | ORAL_TABLET | Freq: Every day | ORAL | 11 refills | Status: DC

## 2018-03-13 NOTE — Progress Notes (Signed)
Subjective:    [name], male    DOB: [birth year], 67 y.o.   MRN: [id number]  HPI Pt here for follow up and management of chronic medical problems which includes diabetes, hyperlipidemia, and hypertension. He is taking medication regularly.  Today complains of problems with his bowels moving regular and increased gas.  He is requesting a refill on the doxycycline for his rosacea.  Dr. May God recently did a colonoscopy and he did have a couple polyps.  This was actually done in July of this year.  I do not see a pathology report on the polyps.  She is pleasant and alert and doing well.  The CT scans of the head and neck were reviewed with him.  Patient says that since he had trouble with his stomach and the scan of the gallbladder was done that he has had much more trouble with constipation.  He is currently been taking MiraLAX and other laxatives and does not have a bowel movement but every 3 days.  He drinks water but is not sure if he is drinking enough water.  He denies any chest pain but does have tightness in his chest every evening but not necessarily related to activity.  He denies any shortness of breath.  He denies any trouble with his stomach with no blood other than problems with bowel movements every 3 days.  He does have increased gas.  He is passing his water without problems.  His sugars at home according to the patient run between 100 1560.  This morning was 185.  His last eye exam was in February and he had a cataract on the right and may end up having a cataract surgery on the left.   Patient Active Problem List   Diagnosis Date Noted  . Thrombocytopenia ([place]) 05/18/2017  . Aortic atherosclerosis ([place]) 04/10/2016  . Rosacea 11/23/2015  . Urinary dysfunction 08/19/2015  . Right foot drop 02/17/2015  . Urinary disorder 02/17/2015  . Insomnia 02/17/2015  . Other fatigue 02/17/2015  . 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 Bladder disorder 09/16/2014  . Multiple sclerosis  ([place]) 01/06/2013  . Ureteral calculus, right 10/30/2011  . Bladder calculus 10/30/2011  . Tobacco use 09/27/2011  . CORONARY ATHEROSCLEROSIS, NATIVE VESSEL 10/06/2009  . Diabetes mellitus type 2 in nonobese ([place]) 10/01/2009  . Hyperlipidemia associated with type 2 diabetes mellitus ([place]) 10/01/2009  . Essential hypertension 10/01/2009   Outpatient Encounter Medications as of 03/13/2018  Medication Sig  . amLODipine (NORVASC) 2.5 MG tablet Take 1 tablet (2.5 mg total) by mouth every morning.  Marland Kitchen aspirin (LONGS ADULT LOW STRENGTH ASA) 81 MG EC tablet Take 81 mg by mouth daily.   Marland Kitchen BETASERON 0.3 MG KIT injection Inject 0.25 mg into the skin every other day.  . blood glucose meter kit and supplies Dispense based on patient and insurance preference. Check BS TID and PRN  (FOR ICD dx E11.9).  . cholecalciferol (VITAMIN D) 1000 UNITS tablet Take 1,000 Units by mouth daily.  . cyanocobalamin (,VITAMIN B-12,) 1000 MCG/ML injection INJECT 1 MILLILITER INTO THE SKIN ONCE AMONTH  . desmopressin (DDAVP) 0.1 MG tablet Take 1 tablet (0.1 mg total) by mouth daily.  Marland Kitchen doxycycline (VIBRA-TABS) 100 MG tablet Take 1 tablet (100 mg total) by mouth daily.  . fish oil-omega-3 fatty acids 1000 MG capsule Take 2 g by mouth 2 (two) times daily.  Marland Kitchen glucose blood test strip Use as instructed  . isosorbide mononitrate (IMDUR) 60 MG  24 hr tablet TAKE 1/2 TABLET BY MOUTH EVERY DAY  . levothyroxine (SYNTHROID, LEVOTHROID) 50 MCG tablet TAKE ONE TABLET BY MOUTH EVERY DAY BEFORE BREAKFAST  . lisinopril (PRINIVIL,ZESTRIL) 10 MG tablet TAKE ONE TABLET BY MOUTH EVERY DAY  . metFORMIN (GLUCOPHAGE) 500 MG tablet TAKE TWO TABLETS BY MOUTH TWICE DAILY WITH MEALS. NEED TO SCHEDULE FOLLOWUP.  Marland Kitchen omeprazole (PRILOSEC) 20 MG capsule Take 20 mg by mouth daily.  . pioglitazone (ACTOS) 30 MG tablet TAKE ONE TABLET BY MOUTH EVERY DAY  . rosuvastatin (CRESTOR) 20 MG tablet TAKE ONE TABLET BY MOUTH EVERY DAY  . sildenafil (VIAGRA) 100 MG  tablet Take 1 tablet (100 mg total) by mouth daily as needed for erectile dysfunction.  . SYRINGE-NEEDLE, DISP, 3 ML (B-D SYRINGE/NEEDLE 3CC/25GX5/8) 25G X 5/8" 3 ML MISC Use to give once a month B12 injections  . tamsulosin (FLOMAX) 0.4 MG CAPS capsule Take 1 capsule (0.4 mg total) by mouth daily after supper.  . [DISCONTINUED] atorvastatin (LIPITOR) 10 MG tablet Take 10 mg by mouth daily.    . [DISCONTINUED] niacin-simvastatin (SIMCOR) 500-20 MG 24 hr tablet Take 1 tablet by mouth at bedtime.    . [DISCONTINUED] Tuberculin-Allergy Syringes (ALLERGY SYRINGE 1CC/27GX1/2") 27G X 1/2" 1 ML MISC Injection once monthly- B 12 shots   Facility-Administered Encounter Medications as of 03/13/2018  Medication  . cyanocobalamin ((VITAMIN B-12)) injection 1,000 mcg      Review of Systems  Constitutional: Negative.   HENT: Negative.   Eyes: Negative.   Respiratory: Negative.   Cardiovascular: Negative.   Gastrointestinal: Positive for constipation.       Irregular BMs / increased gas   Endocrine: Negative.   Genitourinary: Negative.   Musculoskeletal: Negative.   Skin: Negative.   Allergic/Immunologic: Negative.   Neurological: Negative.   Hematological: Negative.   Psychiatric/Behavioral: Negative.        Objective:   Physical Exam  Constitutional: He is oriented to person, place, and time. He appears well-developed and well-nourished.  The patient is pleasant and alert and recent scan results were reviewed with him as well as an abdominal ultrasound back in 2017.  HENT:  Head: Normocephalic and atraumatic.  Right Ear: External ear normal.  Left Ear: External ear normal.  Nose: Nose normal.  Mouth/Throat: Oropharynx is clear and moist. No oropharyngeal exudate.  Ear nose and throat all within normal limits  Eyes: Pupils are equal, round, and reactive to light. Conjunctivae and EOM are normal. Right eye exhibits no discharge. Left eye exhibits no discharge. No scleral icterus.  Neck:  Normal range of motion. Neck supple. No thyromegaly present.  No thyromegaly bruits or anterior cervical adenopathy  Cardiovascular: Normal rate, regular rhythm, normal heart sounds and intact distal pulses.  No murmur heard. Heart is regular at 60/min with good pedal pulses and no edema  Pulmonary/Chest: Effort normal and breath sounds normal. He has no wheezes. He has no rales. He exhibits no tenderness.  Clear anteriorly and posteriorly with no axillary adenopathy or chest wall masses  Abdominal: Soft. Bowel sounds are normal. He exhibits no mass. There is no tenderness.  Slight right upper quadrant tenderness no epigastric or left upper quadrant tenderness no organ enlargement bruits or inguinal adenopathy.  Musculoskeletal: Normal range of motion. He exhibits no edema or tenderness.  Weakness on the right secondary to MS in both upper and lower extremity reflexes more active on the right than the left.  Lymphadenopathy:    He has no cervical adenopathy.  Neurological:  He is alert and oriented to person, place, and time. He has normal reflexes. He displays normal reflexes. No cranial nerve deficit.  Weaker on the right than the left in both upper and lower extremities.  Patient wears a brace on the right.  Reflexes on the right greater than the left.  Skin: Skin is warm and dry. No rash noted.  Psychiatric: He has a normal mood and affect. His behavior is normal. Judgment and thought content normal.  Mood affect behavior all normal for this patient.  Nursing note and vitals reviewed.  BP 113/65 (BP Location: Left Arm)   Pulse 68   Temp (!) 97.4 F (36.3 C) (Oral)   Ht 5' 6" (1.676 m)   Wt 161 lb (73 kg)   BMI 25.99 kg/m        Assessment & Plan:  1. Essential hypertension -The blood pressure is good today and he will continue with current treatment  2. Diabetes mellitus type 2 in nonobese (H[facility]) -Continue with diabetic meds and follow as aggressive therapeutic lifestyle  changes as possible pending results of lab work  3. Hyperlipidemia associated with type 2 diabetes mellitus ([place]) -Continue current treatment pending results of lab work and follow as aggressive therapeutic lifestyle changes as possible  4. Vitamin D deficiency -Continue vitamin D replacement pending results of lab work  5. Atherosclerosis of native coronary artery of native heart without angina pectoris -Continue with statin therapy and therapeutic lifestyle changes  6. Aortic atherosclerosis ([place]) -Continue with statin therapy and therapeutic lifestyle changes  7. Vitamin B12 deficiency anemia due to intrinsic factor deficiency -Check B12 level  8. Multiple sclerosis ([place]) -Follow-up with neurology as planned  9. Senile purpura ([place]) -Still has some senile purpura.  10. Thrombocytopenia ([place]) -Bleeding issues noted by patient  11. Slow transit constipation -Start with Amitiza 24 mcg 1 at bedtime and can continue with MiraLAX until some effect of Amitiza is noted and then he can stop the MiraLAX.  He will add a second Amitiza after for 5 days if no results with one Amitiza at bedtime.  Meds ordered this encounter  Medications  . lubiprostone (AMITIZA) 24 MCG capsule    Sig: Take 1 capsule (24 mcg total) by mouth daily with breakfast.    Dispense:  30 capsule    Refill:  2   Patient Instructions                       Medicare Annual Wellness Visit  [facility] and the medical providers at [facility] strive to bring you the best medical care.  In doing so we not only want to address your current medical conditions and concerns but also to detect new conditions early and prevent illness, disease and health-related problems.    Medicare offers a yearly Wellness Visit which allows our clinical staff to assess your need for preventative services including immunizations, lifestyle education, counseling to decrease risk of preventable diseases and screening  for fall risk and other medical concerns.    This visit is provided free of charge (no copay) for all Medicare recipients. The clinical pharmacists at [facility] have begun to conduct these Wellness Visits which will also include a thorough review of all your medications.    As you primary medical provider recommend that you make an appointment for your Annual Wellness Visit if you have not done so already this year.  You may set up this appointment  before you leave today or you may call back ([phone]) and schedule an appointment.  Please make sure when you call that you mention that you are scheduling your Annual Wellness Visit with the clinical pharmacist so that the appointment may be made for the proper length of time.     Continue current medications. Continue good therapeutic lifestyle changes which include good diet and exercise. Fall precautions discussed with patient. If an FOBT was given today- please return it to our front desk. If you are over 81 years old - you may need Prevnar 8 or the adult Pneumonia vaccine.  **Flu shots are available--- please call and schedule a FLU-CLINIC appointment**  After your visit with [place] today you will receive a survey in the mail or online from [employer] regarding your care with [place]. Please take a moment to fill this out. Your feedback is very important to [place] as you can help [place] better understand your patient needs as well as improve your experience and satisfaction. WE CARE ABOUT YOU!!!   Should make every effort to drink more water He should continue with his probiotic He could use the MiraLAX if he needs to but I want him to try the amitiza a 24 g starting at bedtime and he can increase to taking this twice daily if the bedtime dosage does not work after 4 to 5 days.   Arrie Senate MD

## 2018-03-13 NOTE — Patient Instructions (Addendum)
Medicare Annual Wellness Visit  [facility] and the medical providers at [facility] strive to bring you the best medical care.  In doing so we not only want to address your current medical conditions and concerns but also to detect new conditions early and prevent illness, disease and health-related problems.    Medicare offers a yearly Wellness Visit which allows our clinical staff to assess your need for preventative services including immunizations, lifestyle education, counseling to decrease risk of preventable diseases and screening for fall risk and other medical concerns.    This visit is provided free of charge (no copay) for all Medicare recipients. The clinical pharmacists at [facility] have begun to conduct these Wellness Visits which will also include a thorough review of all your medications.    As you primary medical provider recommend that you make an appointment for your Annual Wellness Visit if you have not done so already this year.  You may set up this appointment before you leave today or you may call back ([phone]) and schedule an appointment.  Please make sure when you call that you mention that you are scheduling your Annual Wellness Visit with the clinical pharmacist so that the appointment may be made for the proper length of time.     Continue current medications. Continue good therapeutic lifestyle changes which include good diet and exercise. Fall precautions discussed with patient. If an FOBT was given today- please return it to our front desk. If you are over 57 years old - you may need Prevnar 73 or the adult Pneumonia vaccine.  **Flu shots are available--- please call and schedule a FLU-CLINIC appointment**  After your visit with [place] today you will receive a survey in the mail or online from [employer] regarding your care with [place]. Please take a moment to fill this out. Your feedback is very  important to [place] as you can help [place] better understand your patient needs as well as improve your experience and satisfaction. WE CARE ABOUT YOU!!!   Should make every effort to drink more water He should continue with his probiotic He could use the MiraLAX if he needs to but I want him to try the amitiza a 24 g starting at bedtime and he can increase to taking this twice daily if the bedtime dosage does not work after 4 to 5 days.

## 2018-03-14 ENCOUNTER — Other Ambulatory Visit: Payer: Self-pay | Admitting: Family Medicine

## 2018-03-14 LAB — CBC WITH DIFFERENTIAL/PLATELET
BASOS ABS: 0 10*3/uL (ref 0.0–0.2)
Basos: 0 %
EOS (ABSOLUTE): 0.1 10*3/uL (ref 0.0–0.4)
Eos: 1 %
HEMATOCRIT: 33.8 % — AB (ref 37.5–51.0)
HEMOGLOBIN: 11.3 g/dL — AB (ref 13.0–17.7)
Immature Grans (Abs): 0 10*3/uL (ref 0.0–0.1)
Immature Granulocytes: 0 %
LYMPHS ABS: 0.8 10*3/uL (ref 0.7–3.1)
Lymphs: 15 %
MCH: 27.2 pg (ref 26.6–33.0)
MCHC: 33.4 g/dL (ref 31.5–35.7)
MCV: 81 fL (ref 79–97)
MONOCYTES: 12 %
Monocytes Absolute: 0.7 10*3/uL (ref 0.1–0.9)
NEUTROS ABS: 4.1 10*3/uL (ref 1.4–7.0)
Neutrophils: 72 %
Platelets: 190 10*3/uL (ref 150–450)
RBC: 4.16 x10E6/uL (ref 4.14–5.80)
RDW: 15.4 % (ref 12.3–15.4)
WBC: 5.7 10*3/uL (ref 3.4–10.8)

## 2018-03-14 LAB — HEPATIC FUNCTION PANEL
ALK PHOS: 66 IU/L (ref 39–117)
ALT: 14 IU/L (ref 0–44)
AST: 19 IU/L (ref 0–40)
Albumin: 4.6 g/dL (ref 3.6–4.8)
BILIRUBIN, DIRECT: 0.1 mg/dL (ref 0.00–0.40)
Bilirubin Total: 0.3 mg/dL (ref 0.0–1.2)
TOTAL PROTEIN: 7.2 g/dL (ref 6.0–8.5)

## 2018-03-14 LAB — BMP8+EGFR
BUN / CREAT RATIO: 25 — AB (ref 10–24)
BUN: 26 mg/dL (ref 8–27)
CHLORIDE: 104 mmol/L (ref 96–106)
CO2: 22 mmol/L (ref 20–29)
Calcium: 9.9 mg/dL (ref 8.6–10.2)
Creatinine, Ser: 1.06 mg/dL (ref 0.76–1.27)
GFR calc non Af Amer: 72 mL/min/{1.73_m2} (ref >59)
GFR, EST AFRICAN AMERICAN: 83 mL/min/{1.73_m2} (ref >59)
Glucose: 95 mg/dL (ref 65–99)
Potassium: 4.4 mmol/L (ref 3.5–5.2)
Sodium: 143 mmol/L (ref 134–144)

## 2018-03-14 LAB — LIPID PANEL
CHOL/HDL RATIO: 4.7 ratio (ref 0.0–5.0)
CHOLESTEROL TOTAL: 132 mg/dL (ref 100–199)
HDL: 28 mg/dL — ABNORMAL LOW (ref >39)
LDL CALC: 79 mg/dL (ref 0–99)
Triglycerides: 123 mg/dL (ref 0–149)
VLDL Cholesterol Cal: 25 mg/dL (ref 5–40)

## 2018-03-14 LAB — VITAMIN D 25 HYDROXY (VIT D DEFICIENCY, FRACTURES): VIT D 25 HYDROXY: 45.3 ng/mL (ref 30.0–100.0)

## 2018-03-14 MED ORDER — ISOSORBIDE MONONITRATE ER 60 MG PO TB24: 30.0000 mg | ORAL_TABLET | Freq: Every day | ORAL | 0 refills | Status: DC

## 2018-03-14 NOTE — Telephone Encounter (Signed)
done

## 2018-03-27 ENCOUNTER — Telehealth: Payer: Self-pay | Admitting: *Deleted

## 2018-03-27 MED ORDER — TAMSULOSIN HCL 0.4 MG PO CAPS: 0.4000 mg | ORAL_CAPSULE | Freq: Every day | ORAL | 2 refills | Status: DC

## 2018-03-27 MED ORDER — DESMOPRESSIN ACETATE 0.1 MG PO TABS: 0.1000 mg | ORAL_TABLET | Freq: Every day | ORAL | 2 refills | Status: DC

## 2018-03-27 NOTE — Telephone Encounter (Signed)
Flomax and Desmopressin escribed to [facility] in response to faxed requests from them/fim

## 2018-04-04 ENCOUNTER — Other Ambulatory Visit: Payer: Self-pay | Admitting: Family Medicine

## 2018-04-08 ENCOUNTER — Other Ambulatory Visit: Payer: PPO

## 2018-04-08 DIAGNOSIS — Z1211 Encounter for screening for malignant neoplasm of colon: Secondary | ICD-10-CM

## 2018-04-09 LAB — FECAL OCCULT BLOOD, IMMUNOCHEMICAL: Fecal Occult Bld: NEGATIVE

## 2018-05-03 ENCOUNTER — Other Ambulatory Visit: Payer: Self-pay | Admitting: Family Medicine

## 2018-05-03 DIAGNOSIS — R5383 Other fatigue: Secondary | ICD-10-CM

## 2018-05-03 NOTE — Telephone Encounter (Signed)
[facility]. 30 days given. Ordered Thyroid panel per [facility] Pt needs to come in for labwork before further refills

## 2018-05-07 ENCOUNTER — Telehealth: Payer: Self-pay | Admitting: Family Medicine

## 2018-05-07 ENCOUNTER — Telehealth: Payer: Self-pay

## 2018-05-07 MED ORDER — LISINOPRIL 10 MG PO TABS: 10.0000 mg | ORAL_TABLET | Freq: Every day | ORAL | 0 refills | Status: DC

## 2018-05-07 NOTE — Telephone Encounter (Signed)
done

## 2018-05-13 NOTE — Telephone Encounter (Signed)
x

## 2018-06-06 ENCOUNTER — Other Ambulatory Visit: Payer: Self-pay | Admitting: *Deleted

## 2018-06-06 MED ORDER — METFORMIN HCL 500 MG PO TABS: ORAL_TABLET | ORAL | 1 refills | Status: DC

## 2018-06-06 NOTE — Telephone Encounter (Signed)
[facility]. Needs to have labwork done. Order in computer. 30 days given 05/03/18

## 2018-06-06 NOTE — Addendum Note (Signed)
Addended by: Antonietta Barcelona D on: 06/06/2018 10:14 AM   Modules accepted: Orders

## 2018-06-07 ENCOUNTER — Other Ambulatory Visit: Payer: Self-pay | Admitting: *Deleted

## 2018-06-07 MED ORDER — LEVOTHYROXINE SODIUM 50 MCG PO TABS: ORAL_TABLET | ORAL | 2 refills | Status: DC

## 2018-06-10 ENCOUNTER — Ambulatory Visit (INDEPENDENT_AMBULATORY_CARE_PROVIDER_SITE_OTHER): Payer: PPO

## 2018-06-10 DIAGNOSIS — Z23 Encounter for immunization: Secondary | ICD-10-CM

## 2018-06-24 ENCOUNTER — Telehealth: Payer: Self-pay | Admitting: Family Medicine

## 2018-06-24 ENCOUNTER — Other Ambulatory Visit: Payer: Self-pay

## 2018-06-24 MED ORDER — ROSUVASTATIN CALCIUM 20 MG PO TABS: 20.0000 mg | ORAL_TABLET | Freq: Every day | ORAL | 0 refills | Status: DC

## 2018-06-24 NOTE — Telephone Encounter (Signed)
What is the name of the medication? Rosuvastatin 20 mg Patient is out  Have you contacted your pharmacy to request a refill? YES  Which pharmacy would you like this sent to? [facility]   Patient notified that their request is being sent to the clinical staff for review and that they should receive a call once it is complete. If they do not receive a call within 24 hours they can check with their pharmacy or our office.

## 2018-06-24 NOTE — Telephone Encounter (Signed)
done

## 2018-06-26 ENCOUNTER — Other Ambulatory Visit: Payer: Self-pay | Admitting: Family Medicine

## 2018-07-01 ENCOUNTER — Telehealth: Payer: Self-pay | Admitting: Neurology

## 2018-07-01 NOTE — Telephone Encounter (Signed)
Pt requesting a call stating he has a form he would like to discuss with RN for medication BETASERON 0.3 MG KIT injection

## 2018-07-01 NOTE — Telephone Encounter (Signed)
Called pt back. He states he needs pt assistance forms filled out. He completed his portion and only needs MD section filled out. He will bring by the office to have [place] complete.

## 2018-07-03 ENCOUNTER — Other Ambulatory Visit: Payer: Self-pay | Admitting: Family Medicine

## 2018-07-29 ENCOUNTER — Telehealth: Payer: Self-pay | Admitting: Neurology

## 2018-07-29 NOTE — Telephone Encounter (Signed)
Faith- Do you know anything about this? I did not see a note

## 2018-07-29 NOTE — Telephone Encounter (Signed)
Patient asking about status of patient assistance form for [place]. The form was brought in to the office on 07/01/2018 & given to Iuka by his wife when she came out to the lobby. Please call

## 2018-07-30 ENCOUNTER — Telehealth: Payer: Self-pay | Admitting: Neurology

## 2018-07-30 ENCOUNTER — Other Ambulatory Visit: Payer: Self-pay | Admitting: *Deleted

## 2018-07-30 MED ORDER — ISOSORBIDE MONONITRATE ER 60 MG PO TB24: 30.0000 mg | ORAL_TABLET | Freq: Every day | ORAL | 0 refills | Status: DC

## 2018-07-30 NOTE — Telephone Encounter (Signed)
Printed prescriber portion from [place] assistance. Will complete and send in for pt

## 2018-07-30 NOTE — Telephone Encounter (Signed)
Emma refaxed physician portion of Pt. assitance forms to [facility]

## 2018-07-30 NOTE — Telephone Encounter (Signed)
Pt needs a refill for his BETASERON 0.3 MG KIT injection going through [place] assistance.

## 2018-07-30 NOTE — Telephone Encounter (Signed)
Faxed completed/signed prescriber section of [employer] pt assistance to [phone]. This included prescription for Betaseron 1kit, 11 refills. Received fax confirmation.

## 2018-07-31 NOTE — Telephone Encounter (Signed)
Pt is asking for a call re: letter he brought to office on 07-01-2018 re: a letter he needs completed for [place] assistance

## 2018-07-31 NOTE — Telephone Encounter (Signed)
I called patient back. Apologized for any inconvenience and told him we could not locate forms dropped off on 07/01/18. Advised we did fax provider portion yesterday. He will need to fill out his portion again and send this in. He will call [employer] and discuss with them. He will call back if anything further needed.

## 2018-08-07 ENCOUNTER — Encounter: Payer: Self-pay | Admitting: Family Medicine

## 2018-08-07 ENCOUNTER — Ambulatory Visit (INDEPENDENT_AMBULATORY_CARE_PROVIDER_SITE_OTHER): Payer: PPO | Admitting: Family Medicine

## 2018-08-07 VITALS — BP 124/71 | HR 69 | Temp 96.8°F | Ht 66.0 in | Wt 166.0 lb

## 2018-08-07 DIAGNOSIS — I700 Atherosclerosis of aorta: Secondary | ICD-10-CM

## 2018-08-07 DIAGNOSIS — G35 Multiple sclerosis: Secondary | ICD-10-CM | POA: Diagnosis not present

## 2018-08-07 DIAGNOSIS — E119 Type 2 diabetes mellitus without complications: Secondary | ICD-10-CM | POA: Diagnosis not present

## 2018-08-07 DIAGNOSIS — L03116 Cellulitis of left lower limb: Secondary | ICD-10-CM | POA: Diagnosis not present

## 2018-08-07 DIAGNOSIS — N400 Enlarged prostate without lower urinary tract symptoms: Secondary | ICD-10-CM | POA: Diagnosis not present

## 2018-08-07 DIAGNOSIS — I2510 Atherosclerotic heart disease of native coronary artery without angina pectoris: Secondary | ICD-10-CM | POA: Diagnosis not present

## 2018-08-07 DIAGNOSIS — D696 Thrombocytopenia, unspecified: Secondary | ICD-10-CM

## 2018-08-07 DIAGNOSIS — E559 Vitamin D deficiency, unspecified: Secondary | ICD-10-CM

## 2018-08-07 DIAGNOSIS — I10 Essential (primary) hypertension: Secondary | ICD-10-CM | POA: Diagnosis not present

## 2018-08-07 DIAGNOSIS — D519 Vitamin B12 deficiency anemia, unspecified: Secondary | ICD-10-CM | POA: Diagnosis not present

## 2018-08-07 DIAGNOSIS — D510 Vitamin B12 deficiency anemia due to intrinsic factor deficiency: Secondary | ICD-10-CM | POA: Diagnosis not present

## 2018-08-07 DIAGNOSIS — E1169 Type 2 diabetes mellitus with other specified complication: Secondary | ICD-10-CM

## 2018-08-07 DIAGNOSIS — E785 Hyperlipidemia, unspecified: Secondary | ICD-10-CM | POA: Diagnosis not present

## 2018-08-07 LAB — URINALYSIS, COMPLETE
Bilirubin, UA: NEGATIVE
Glucose, UA: NEGATIVE
KETONES UA: NEGATIVE
Leukocytes, UA: NEGATIVE
NITRITE UA: NEGATIVE
Specific Gravity, UA: 1.025 (ref 1.005–1.030)
Urobilinogen, Ur: 0.2 mg/dL (ref 0.2–1.0)
pH, UA: 5.5 (ref 5.0–7.5)

## 2018-08-07 LAB — MICROSCOPIC EXAMINATION
Bacteria, UA: NONE SEEN
Epithelial Cells (non renal): NONE SEEN /hpf (ref 0–10)
RBC, UA: >30 /hpf — AB (ref 0–2)
Renal Epithel, UA: NONE SEEN /hpf

## 2018-08-07 LAB — BAYER DCA HB A1C WAIVED: HB A1C (BAYER DCA - WAIVED): 6.9 % (ref <7.0)

## 2018-08-07 MED ORDER — LISINOPRIL 10 MG PO TABS: 10.0000 mg | ORAL_TABLET | Freq: Every day | ORAL | 3 refills | Status: DC

## 2018-08-07 MED ORDER — LEVOTHYROXINE SODIUM 50 MCG PO TABS: ORAL_TABLET | ORAL | 3 refills | Status: DC

## 2018-08-07 MED ORDER — CYANOCOBALAMIN 1000 MCG/ML IJ SOLN: INTRAMUSCULAR | 3 refills | Status: DC

## 2018-08-07 MED ORDER — DOXYCYCLINE HYCLATE 100 MG PO TABS: 100.0000 mg | ORAL_TABLET | Freq: Every day | ORAL | 3 refills | Status: DC

## 2018-08-07 NOTE — Patient Instructions (Addendum)
Medicare Annual Wellness Visit  [facility] and the medical providers at [facility] strive to bring you the best medical care.  In doing so we not only want to address your current medical conditions and concerns but also to detect new conditions early and prevent illness, disease and health-related problems.    Medicare offers a yearly Wellness Visit which allows our clinical staff to assess your need for preventative services including immunizations, lifestyle education, counseling to decrease risk of preventable diseases and screening for fall risk and other medical concerns.    This visit is provided free of charge (no copay) for all Medicare recipients. The clinical pharmacists at [facility] have begun to conduct these Wellness Visits which will also include a thorough review of all your medications.    As you primary medical provider recommend that you make an appointment for your Annual Wellness Visit if you have not done so already this year.  You may set up this appointment before you leave today or you may call back ([phone]) and schedule an appointment.  Please make sure when you call that you mention that you are scheduling your Annual Wellness Visit with the clinical pharmacist so that the appointment may be made for the proper length of time.   ] Continue current medications. Continue good therapeutic lifestyle changes which include good diet and exercise. Fall precautions discussed with patient. If an FOBT was given today- please return it to our front desk. If you are over 32 years old - you may need Prevnar 23 or the adult Pneumonia vaccine.  **Flu shots are available--- please call and schedule a FLU-CLINIC appointment**  After your visit with [place] today you will receive a survey in the mail or online from [employer] regarding your care with [place]. Please take a moment to fill this out. Your feedback is very  important to [place] as you can help [place] better understand your patient needs as well as improve your experience and satisfaction. WE CARE ABOUT YOU!!!   Follow-up with cardiology urology and neurology as planned Continue to be careful and do not put yourself at risk for falling.  Move slowly. Continue to check blood sugars regularly at home and drink plenty of water For constipation use MiraLAX at least 1 scoop daily and if constipation is still difficult 1 scoop twice daily.  Once again, drink more water Follow-up with ophthalmology as planned

## 2018-08-07 NOTE — Progress Notes (Signed)
Subjective:    [name], male    DOB: March 12, 1951, 68 y.o.   MRN: [id number]  HPI Pt here for follow up and management of chronic medical problems which includes diabetes and hypertension. He is taking medication regularly.  Patient is doing well overall.  The significant problem with this patient is that he has MS and he is followed regularly by his neurologist.  He does have a history of atherosclerosis of the aorta thrombocytopenia type 2 diabetes BPH.  He is followed regularly now by Dr. Karsten Ro for his BPH.  He will get lab work done today.  He also has rosacea.  Patient sees Dr. Felecia Shelling for his MS.  His initial vital signs were stable.  He needs a refill on his B12 and doxycycline.  Patient today is kind and pleasant as usual and has not seen the urologist for over 2 years.  He has not had a follow-up visit with cardiologist because he was told if he did not have any trouble he did not need to come back.  He does have risk factors for heart disease.  Today he denies any chest pain pressure tightness or shortness of breath anymore than usual.  He is swallowing without problems with no heartburn indigestion nausea vomiting diarrhea blood in the stool or black tarry bowel movements.  He does have some constipation which seems to be worsening.  He did have a colonoscopy by Dr. May God recently and was told or given some medicine that he said he could not afford.  He will try taking more MiraLAX.  He is passing his water well without burning pain or frequency but thinks he has seen some blood in the urine so we will check a urine today and if the PSA is elevated will refer him back to his urologist.  His last eye exam was about a year ago when he had a right cataract and is due to get another eye exam and the doctors are in [place].  He sees the neurologist every 6 months.  His blood sugars at home of been running in the 130s fasting.    Patient Active Problem List   Diagnosis Date  Noted  . Thrombocytopenia ([place]) 05/18/2017  . Aortic atherosclerosis ([place]) 04/10/2016  . Rosacea 11/23/2015  . Urinary dysfunction 08/19/2015  . Right foot drop 02/17/2015  . Urinary disorder 02/17/2015  . Insomnia 02/17/2015  . Other fatigue 02/17/2015  . 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 Bladder disorder 09/16/2014  . Multiple sclerosis ([place]) 01/06/2013  . Ureteral calculus, right 10/30/2011  . Bladder calculus 10/30/2011  . Tobacco use 09/27/2011  . CORONARY ATHEROSCLEROSIS, NATIVE VESSEL 10/06/2009  . Diabetes mellitus type 2 in nonobese ([place]) 10/01/2009  . Hyperlipidemia associated with type 2 diabetes mellitus ([place]) 10/01/2009  . Essential hypertension 10/01/2009   Outpatient Encounter Medications as of 08/07/2018  Medication Sig  . amLODipine (NORVASC) 2.5 MG tablet TAKE ONE TABLET BY MOUTH EVERY MORNING  . aspirin (LONGS ADULT LOW STRENGTH ASA) 81 MG EC tablet Take 81 mg by mouth daily.   Marland Kitchen BETASERON 0.3 MG KIT injection Inject 0.25 mg into the skin every other day.  . blood glucose meter kit and supplies Dispense based on patient and insurance preference. Check BS TID and PRN  (FOR ICD dx E11.9).  . cholecalciferol (VITAMIN D) 1000 UNITS tablet Take 1,000 Units by mouth daily.  . cyanocobalamin (,VITAMIN B-12,) 1000 MCG/ML injection INJECT 1ML INTO THE SKIN ONCE MONTH  .  desmopressin (DDAVP) 0.1 MG tablet Take 1 tablet (0.1 mg total) by mouth daily.  Marland Kitchen doxycycline (VIBRA-TABS) 100 MG tablet Take 1 tablet (100 mg total) by mouth daily.  . fish oil-omega-3 fatty acids 1000 MG capsule Take 2 g by mouth 2 (two) times daily.  Marland Kitchen glucose blood test strip Use as instructed  . isosorbide mononitrate (IMDUR) 60 MG 24 hr tablet Take 0.5 tablets (30 mg total) by mouth daily.  Marland Kitchen levothyroxine (SYNTHROID, LEVOTHROID) 50 MCG tablet TAKE ONE TABLET BY MOUTH DAILY BEFORE BREAKFAST  . lisinopril (PRINIVIL,ZESTRIL) 10 MG tablet Take 1 tablet (10 mg total) by mouth daily.  Marland Kitchen lubiprostone (AMITIZA) 24  MCG capsule Take 1 capsule (24 mcg total) by mouth daily with breakfast.  . metFORMIN (GLUCOPHAGE) 500 MG tablet TAKE TWO TABLETS BY MOUTH TWICE DAILY WITH MEALS.  Marland Kitchen omeprazole (PRILOSEC) 20 MG capsule Take 20 mg by mouth daily.  . pioglitazone (ACTOS) 30 MG tablet TAKE ONE TABLET BY MOUTH DAILY  . rosuvastatin (CRESTOR) 20 MG tablet Take 1 tablet (20 mg total) by mouth daily.  . sildenafil (VIAGRA) 100 MG tablet Take 1 tablet (100 mg total) by mouth daily as needed for erectile dysfunction.  . SYRINGE-NEEDLE, DISP, 3 ML (B-D SYRINGE/NEEDLE 3CC/25GX5/8) 25G X 5/8" 3 ML MISC Use to give once a month B12 injections  . tamsulosin (FLOMAX) 0.4 MG CAPS capsule Take 1 capsule (0.4 mg total) by mouth daily after supper.  . [DISCONTINUED] atorvastatin (LIPITOR) 10 MG tablet Take 10 mg by mouth daily.    . [DISCONTINUED] niacin-simvastatin (SIMCOR) 500-20 MG 24 hr tablet Take 1 tablet by mouth at bedtime.     Facility-Administered Encounter Medications as of 08/07/2018  Medication  . cyanocobalamin ((VITAMIN B-12)) injection 1,000 mcg     Review of Systems  Constitutional: Negative.   HENT: Negative.   Eyes: Negative.   Respiratory: Negative.   Cardiovascular: Negative.   Gastrointestinal: Negative.   Endocrine: Negative.   Genitourinary: Negative.   Musculoskeletal: Negative.   Skin: Negative.   Allergic/Immunologic: Negative.   Neurological: Negative.   Hematological: Negative.   Psychiatric/Behavioral: Negative.        Objective:   Physical Exam Vitals signs and nursing note reviewed.  Constitutional:      Appearance: Normal appearance. He is well-developed. He is not ill-appearing.     Comments: Patient is pleasant and alert and still has right-sided weakness which he says is getting worse and he is getting slower.  He has discussed this with the neurologist.  HENT:     Head: Normocephalic and atraumatic.     Right Ear: Tympanic membrane, ear canal and external ear normal. There  is no impacted cerumen.     Left Ear: Tympanic membrane, ear canal and external ear normal. There is no impacted cerumen.     Nose: Nose normal. No congestion or rhinorrhea.     Mouth/Throat:     Mouth: Mucous membranes are moist.     Pharynx: Oropharynx is clear. No oropharyngeal exudate or posterior oropharyngeal erythema.  Eyes:     General: No scleral icterus.       Right eye: No discharge.        Left eye: No discharge.     Extraocular Movements: Extraocular movements intact.     Conjunctiva/sclera: Conjunctivae normal.     Pupils: Pupils are equal, round, and reactive to light.     Comments: Follow-up with ophthalmology as planned  Neck:     Musculoskeletal: Normal range  of motion and neck supple. No neck rigidity.     Thyroid: No thyromegaly.     Vascular: No carotid bruit.     Trachea: No tracheal deviation.     Comments: No bruits thyromegaly or anterior cervical adenopathy Cardiovascular:     Rate and Rhythm: Normal rate and regular rhythm.     Pulses: Normal pulses.     Heart sounds: Normal heart sounds. No murmur.     Comments: Heart is regular at 72/min with good pedal pulses bilaterally and no edema Pulmonary:     Effort: Pulmonary effort is normal. No respiratory distress.     Breath sounds: Normal breath sounds. No wheezing or rales.     Comments: Clear anteriorly and posteriorly and no axillary adenopathy no chest wall masses or tenderness Chest:     Chest wall: No tenderness.  Abdominal:     General: Abdomen is flat. Bowel sounds are normal.     Palpations: Abdomen is soft. There is no mass.     Tenderness: There is no abdominal tenderness. There is no guarding.     Comments: No organ enlargement bruits or masses and no inguinal adenopathy  Genitourinary:    Penis: Normal.      Scrotum/Testes: Normal.     Rectum: Normal.     Comments: The prostate is enlarged with the left being greater than the right but no masses or tenderness and no rectal masses.  The  external genitalia were within normal limits and no testicular abnormality was noted.  No rectal masses. Musculoskeletal: Normal range of motion.        General: Deformity present. No tenderness.     Right lower leg: No edema.     Left lower leg: No edema.     Comments: Due to the patient's MS he has rigidity in the right lower extremity and this seems to be getting worse according to the patient.  Lymphadenopathy:     Cervical: No cervical adenopathy.  Skin:    General: Skin is warm and dry.     Findings: No rash.  Neurological:     Mental Status: He is alert and oriented to person, place, and time. Mental status is at baseline.     Cranial Nerves: No cranial nerve deficit.     Motor: Weakness present.     Gait: Gait abnormal.     Deep Tendon Reflexes: Reflexes are normal and symmetric. Reflexes normal.     Comments: Right lower extremity ache stiffness and rigidity that is secondary to his MS.  Psychiatric:        Mood and Affect: Mood normal.        Behavior: Behavior normal.        Thought Content: Thought content normal.        Judgment: Judgment normal.    BP 124/71 (BP Location: Left Arm)   Pulse 69   Temp (!) 96.8 F (36 C) (Oral)   Ht 5' 6"  (1.676 m)   Wt 166 lb (75.3 kg)   BMI 26.79 kg/m         Assessment & Plan:  1. Vitamin D deficiency -Continue with vitamin D replacement pending results of lab work - CBC with Differential/Platelet - VITAMIN D 25 Hydroxy (Vit-D Deficiency, Fractures)  2. Diabetes mellitus type 2 in nonobese [facility]) -Continue current treatment pending results of lab work and follow as aggressive therapeutic lifestyle changes as possible - CBC with Differential/Platelet - Bayer DCA Hb A1c Waived - Ambulatory  referral to Cardiology  3. Essential hypertension -Continue current treatment watch sodium intake drink plenty of water and fluids - BMP8+EGFR - CBC with Differential/Platelet - Hepatic function panel - Ambulatory referral to  Cardiology  4. Hyperlipidemia associated with type 2 diabetes mellitus ([place]) -Continue current treatment pending results of lab work and follow-up with cardiology because of increased risk factors for heart disease - CBC with Differential/Platelet - Lipid panel - Ambulatory referral to Cardiology  5. Atherosclerosis of native coronary artery of native heart without angina pectoris -Continue statin therapy and as aggressive therapeutic lifestyle changes as possible - CBC with Differential/Platelet - Lipid panel - Ambulatory referral to Cardiology  6. Aortic atherosclerosis (HCC) -Continue statin therapy and therapeutic lifestyle changes - CBC with Differential/Platelet - Lipid panel - Ambulatory referral to Cardiology  7. Vitamin B12 deficiency anemia due to intrinsic factor deficiency - CBC with Differential/Platelet - Vitamin B12  8 rosacea - doxycycline (VIBRA-TABS) 100 MG tablet; Take 1 tablet (100 mg total) by mouth daily.  Dispense: 90 tablet; Refill: 3  9. Benign prostatic hyperplasia, unspecified whether lower urinary tract symptoms present -Depending on PSA results may consider referring back to Dr. Karsten Ro - PSA, total and free - Urinalysis, Complete - Urine Culture  10. Multiple sclerosis ([place]) -Follow-up with Dr. Felecia Shelling  11. Thrombocytopenia (HCC) -Check platelet count  12. Anemia due to vitamin B12 deficiency, unspecified B12 deficiency type -Check B12 level  Meds ordered this encounter  Medications  . cyanocobalamin (,VITAMIN B-12,) 1000 MCG/ML injection    Sig: INJECT 1ML INTO THE SKIN ONCE MONTH    Dispense:  3 mL    Refill:  3  . levothyroxine (SYNTHROID, LEVOTHROID) 50 MCG tablet    Sig: TAKE ONE TABLET BY MOUTH DAILY BEFORE BREAKFAST    Dispense:  90 tablet    Refill:  3  . lisinopril (PRINIVIL,ZESTRIL) 10 MG tablet    Sig: Take 1 tablet (10 mg total) by mouth daily.    Dispense:  90 tablet    Refill:  3  . doxycycline (VIBRA-TABS) 100 MG  tablet    Sig: Take 1 tablet (100 mg total) by mouth daily.    Dispense:  90 tablet    Refill:  3   Patient Instructions                       Medicare Annual Wellness Visit  [facility] and the medical providers at [facility] strive to bring you the best medical care.  In doing so we not only want to address your current medical conditions and concerns but also to detect new conditions early and prevent illness, disease and health-related problems.    Medicare offers a yearly Wellness Visit which allows our clinical staff to assess your need for preventative services including immunizations, lifestyle education, counseling to decrease risk of preventable diseases and screening for fall risk and other medical concerns.    This visit is provided free of charge (no copay) for all Medicare recipients. The clinical pharmacists at [facility] have begun to conduct these Wellness Visits which will also include a thorough review of all your medications.    As you primary medical provider recommend that you make an appointment for your Annual Wellness Visit if you have not done so already this year.  You may set up this appointment before you leave today or you may call back ([phone]) and schedule an appointment.  Please make sure when  you call that you mention that you are scheduling your Annual Wellness Visit with the clinical pharmacist so that the appointment may be made for the proper length of time.   ] Continue current medications. Continue good therapeutic lifestyle changes which include good diet and exercise. Fall precautions discussed with patient. If an FOBT was given today- please return it to our front desk. If you are over 70 years old - you may need Prevnar 23 or the adult Pneumonia vaccine.  **Flu shots are available--- please call and schedule a FLU-CLINIC appointment**  After your visit with [place] today you will receive a survey in the  mail or online from [employer] regarding your care with [place]. Please take a moment to fill this out. Your feedback is very important to [place] as you can help [place] better understand your patient needs as well as improve your experience and satisfaction. WE CARE ABOUT YOU!!!   Follow-up with cardiology urology and neurology as planned Continue to be careful and do not put yourself at risk for falling.  Move slowly. Continue to check blood sugars regularly at home and drink plenty of water For constipation use MiraLAX at least 1 scoop daily and if constipation is still difficult 1 scoop twice daily.  Once again, drink more water Follow-up with ophthalmology as planned  Arrie Senate MD

## 2018-08-08 LAB — BMP8+EGFR
BUN/Creatinine Ratio: 14 (ref 10–24)
BUN: 16 mg/dL (ref 8–27)
CALCIUM: 9.8 mg/dL (ref 8.6–10.2)
CHLORIDE: 102 mmol/L (ref 96–106)
CO2: 22 mmol/L (ref 20–29)
Creatinine, Ser: 1.11 mg/dL (ref 0.76–1.27)
GFR calc Af Amer: 78 mL/min/{1.73_m2} (ref >59)
GFR calc non Af Amer: 68 mL/min/{1.73_m2} (ref >59)
Glucose: 137 mg/dL — ABNORMAL HIGH (ref 65–99)
Potassium: 4.3 mmol/L (ref 3.5–5.2)
Sodium: 141 mmol/L (ref 134–144)

## 2018-08-08 LAB — CBC WITH DIFFERENTIAL/PLATELET
Basophils Absolute: 0 10*3/uL (ref 0.0–0.2)
Basos: 0 %
EOS (ABSOLUTE): 0.1 10*3/uL (ref 0.0–0.4)
EOS: 1 %
HEMATOCRIT: 37.9 % (ref 37.5–51.0)
Hemoglobin: 12.4 g/dL — ABNORMAL LOW (ref 13.0–17.7)
IMMATURE GRANULOCYTES: 1 %
Immature Grans (Abs): 0 10*3/uL (ref 0.0–0.1)
Lymphocytes Absolute: 0.6 10*3/uL — ABNORMAL LOW (ref 0.7–3.1)
Lymphs: 7 %
MCH: 27.6 pg (ref 26.6–33.0)
MCHC: 32.7 g/dL (ref 31.5–35.7)
MCV: 84 fL (ref 79–97)
MONOS ABS: 0.9 10*3/uL (ref 0.1–0.9)
Monocytes: 10 %
Neutrophils Absolute: 7 10*3/uL (ref 1.4–7.0)
Neutrophils: 81 %
Platelets: 211 10*3/uL (ref 150–450)
RBC: 4.49 x10E6/uL (ref 4.14–5.80)
RDW: 14.1 % (ref 11.6–15.4)
WBC: 8.7 10*3/uL (ref 3.4–10.8)

## 2018-08-08 LAB — LIPID PANEL
Chol/HDL Ratio: 3.9 ratio (ref 0.0–5.0)
Cholesterol, Total: 121 mg/dL (ref 100–199)
HDL: 31 mg/dL — ABNORMAL LOW (ref >39)
LDL Calculated: 75 mg/dL (ref 0–99)
TRIGLYCERIDES: 75 mg/dL (ref 0–149)
VLDL Cholesterol Cal: 15 mg/dL (ref 5–40)

## 2018-08-08 LAB — PSA, TOTAL AND FREE
PSA, Free Pct: 25.5 %
PSA, Free: 1.12 ng/mL
Prostate Specific Ag, Serum: 4.4 ng/mL — ABNORMAL HIGH (ref 0.0–4.0)

## 2018-08-08 LAB — HEPATIC FUNCTION PANEL
ALT: 21 IU/L (ref 0–44)
AST: 24 IU/L (ref 0–40)
Albumin: 4.6 g/dL (ref 3.8–4.8)
Alkaline Phosphatase: 72 IU/L (ref 39–117)
Bilirubin Total: 0.3 mg/dL (ref 0.0–1.2)
Bilirubin, Direct: 0.1 mg/dL (ref 0.00–0.40)
Total Protein: 7.2 g/dL (ref 6.0–8.5)

## 2018-08-08 LAB — URINE CULTURE: Organism ID, Bacteria: NO GROWTH

## 2018-08-08 LAB — VITAMIN B12: VITAMIN B 12: 541 pg/mL (ref 232–1245)

## 2018-08-08 LAB — VITAMIN D 25 HYDROXY (VIT D DEFICIENCY, FRACTURES): Vit D, 25-Hydroxy: 43.3 ng/mL (ref 30.0–100.0)

## 2018-08-09 ENCOUNTER — Encounter: Payer: Self-pay | Admitting: *Deleted

## 2018-08-09 ENCOUNTER — Telehealth: Payer: Self-pay | Admitting: Family Medicine

## 2018-08-09 DIAGNOSIS — R3129 Other microscopic hematuria: Secondary | ICD-10-CM

## 2018-08-09 DIAGNOSIS — R972 Elevated prostate specific antigen [PSA]: Secondary | ICD-10-CM

## 2018-08-09 NOTE — Telephone Encounter (Signed)
Pt aware of results and future orders placed to have PSA and U/A.

## 2018-08-13 NOTE — Progress Notes (Signed)
Cardiology Office Note   Date:  08/14/2018   ID:  Jacob, Fowler Nov 27, 1950, MRN [id number]  PCP:  Chipper Herb, MD  Cardiologist:   No primary care provider on file.   Chief Complaint  Patient presents with  . Abnormal ECG      History of Present Illness: Jacob Fowler is a 68 y.o. male who presents for followup of his known nonobstructive disease. He had his last catheterization in 2010 with non obstructive disease. He has had no cardiovascular symptoms since then. I last saw him last in 2017.     He returns for follow-up of his abnormal EKG.  He has right bundle branch block.  He gets around slowly with multiple sclerosis.  He uses a cane and has a leg brace.  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  He denies any chest pressure, neck or arm discomfort.  He is had no palpitations, presyncope or syncope.  Said no weight gain or edema.  Denies any shortness of breath, PND or orthopnea.   Past Medical History:  Diagnosis Date  . Acquired leg length discrepancy   . Bladder stone   . BPH associated with nocturia   . CAD (coronary artery disease) CARDIOLOGIST- DR Kimble Delaurentis--  LAST VISIT 09-27-2011 IN EPIC   Non obstructive Cath 2010-  HX CORONARY SPASM 2004  . Cataract    bilateral   . GERD (gastroesophageal reflux disease)   . H/O hiatal hernia   . History of nephrolithiasis 2009  . Hx of adenomatous colonic polyps   . Hyperlipidemia   . Hypertension   . Movement disorder   . Multiple sclerosis ([place]) DX  10/10---  NEUROLOGIST  DR Delfino Lovett [facility] ([place])   RIGHT SIDE AFFECTED MORE W/ WEAKNESS- - USES CANE  . Neuromuscular disorder (HCC)    MS  . NIDDM (non-insulin dependent diabetes mellitus)   . Peyronie's disease   . RBBB   . Renal stone RIGHT  . Right ureteral stone   . Rosacea   . Vision abnormalities     Past Surgical History:  Procedure Laterality Date  . CARDIAC  CATHETERIZATION  02-26-2009 ---  DR Ida Rogue   NONOBSTRUCTIVE CAD/ 50% DISTAL RCA/ 30% LAD / NORMAL LVF  . CYSTOSCOPY W/ RETROGRADES  10/30/2011   Procedure: CYSTOSCOPY WITH RETROGRADE PYELOGRAM;  Surgeon: Claybon Jabs, MD;  Location: [facility];  Service: Urology;  Laterality: Right;  . CYSTOSCOPY WITH LITHOLAPAXY  10/30/2011   Procedure: CYSTOSCOPY WITH LITHOLAPAXY;  Surgeon: Claybon Jabs, MD;  Location[facility];  Service: Urology;  Laterality: N/A;  . EXTRACORPOREAL SHOCK WAVE LITHOTRIPSY  09-05-2010   RIGHT  . NESBIT PROCEDURE  10-17-2004   CORRECTION OF PENILE ANGULATION (PEYRONIES DISEASE)     Current Outpatient Medications  Medication Sig Dispense Refill  . amLODipine (NORVASC) 2.5 MG tablet TAKE ONE TABLET BY MOUTH EVERY MORNING 90 tablet 1  . aspirin (LONGS ADULT LOW STRENGTH ASA) 81 MG EC tablet Take 81 mg by mouth daily.     Marland Kitchen BETASERON 0.3 MG KIT injection Inject 0.25 mg into the skin every other day. 1 kit 11  . cholecalciferol (VITAMIN D) 1000 UNITS tablet Take 1,000 Units by mouth daily.    . cyanocobalamin (,VITAMIN B-12,) 1000 MCG/ML injection INJECT 1ML INTO THE SKIN ONCE MONTH 3 mL 3  . desmopressin (  DDAVP) 0.1 MG tablet Take 1 tablet (0.1 mg total) by mouth daily. 90 tablet 2  . doxycycline (VIBRA-TABS) 100 MG tablet Take 1 tablet (100 mg total) by mouth daily. 90 tablet 3  . fish oil-omega-3 fatty acids 1000 MG capsule Take 2 g by mouth 2 (two) times daily.    . isosorbide mononitrate (IMDUR) 60 MG 24 hr tablet Take 0.5 tablets (30 mg total) by mouth daily. 45 tablet 0  . levothyroxine (SYNTHROID, LEVOTHROID) 50 MCG tablet TAKE ONE TABLET BY MOUTH DAILY BEFORE BREAKFAST 90 tablet 3  . lisinopril (PRINIVIL,ZESTRIL) 10 MG tablet Take 1 tablet (10 mg total) by mouth daily. 90 tablet 3  . metFORMIN (GLUCOPHAGE) 500 MG tablet TAKE TWO TABLETS BY MOUTH TWICE DAILY WITH MEALS. 120 tablet 1  . omeprazole (PRILOSEC) 20 MG capsule Take  20 mg by mouth daily.    . pioglitazone (ACTOS) 30 MG tablet TAKE ONE TABLET BY MOUTH DAILY 90 tablet 0  . rosuvastatin (CRESTOR) 20 MG tablet Take 1 tablet (20 mg total) by mouth daily. 90 tablet 0  . sildenafil (VIAGRA) 100 MG tablet Take 1 tablet (100 mg total) by mouth daily as needed for erectile dysfunction. 12 tablet 2  . tamsulosin (FLOMAX) 0.4 MG CAPS capsule Take 1 capsule (0.4 mg total) by mouth daily after supper. 90 capsule 2  . blood glucose meter kit and supplies Dispense based on patient and insurance preference. Check BS TID and PRN  (FOR ICD dx E11.9). 1 each 1  . glucose blood test strip Use as instructed 100 each 12  . SYRINGE-NEEDLE, DISP, 3 ML (B-D SYRINGE/NEEDLE 3CC/25GX5/8) 25G X 5/8" 3 ML MISC Use to give once a month B12 injections 12 each 0   Current Facility-Administered Medications  Medication Dose Route Frequency Provider Last Rate Last Dose  . cyanocobalamin ((VITAMIN B-12)) injection 1,000 mcg  1,000 mcg Intramuscular Q30 days Chipper Herb, MD   1,000 mcg at 03/02/15 1647    Allergies:   Patient has no known allergies.    ROS:  Please see the history of present illness.   Otherwise, review of systems are positive for none.   All other systems are reviewed and negative.    PHYSICAL EXAM: VS:  BP 108/70   Pulse 74   Ht 5' 6"  (1.676 m)   Wt 166 lb (75.3 kg)   BMI 26.79 kg/m  , BMI Body mass index is 26.79 kg/m. GENERAL:  Well appearing NECK:  No jugular venous distention, waveform within normal limits, carotid upstroke brisk and symmetric, no bruits, no thyromegaly LUNGS:  Clear to auscultation bilaterally BACK:  No CVA tenderness CHEST:  Unremarkable HEART:  PMI not displaced or sustained,S1 and S2 within normal limits, no S3, no S4, no clicks, no rubs, 2 out of 6 brief apical systolic murmur nonradiating, no diastolic murmurs ABD:  Flat, positive bowel sounds normal in frequency in pitch, no bruits, no rebound, no guarding, no midline pulsatile  mass, no hepatomegaly, no splenomegaly EXT:  2 plus pulses throughout, no edema, no cyanosis no clubbing NEURO: Diffuse muscle weakness   EKG:  EKG is ordered today. The ekg ordered today demonstrates sinus rhythm, rate 74, right bundle branch block, no acute ST-T wave changes.   Recent Labs: 08/07/2018: ALT 21; BUN 16; Creatinine, Ser 1.11; Hemoglobin 12.4; Platelets 211; Potassium 4.3; Sodium 141    Lipid Panel    Component Value Date/Time   CHOL 121 08/07/2018 0945   CHOL 196 01/06/2013 1311  TRIG 75 08/07/2018 0945   TRIG 76 08/15/2016 1008   TRIG 149 01/06/2013 1311   HDL 31 (L) 08/07/2018 0945   HDL 33 (L) 08/15/2016 1008   HDL 29 (L) 01/06/2013 1311   CHOLHDL 3.9 08/07/2018 0945   CHOLHDL 7.0 02/26/2009 0145   VLDL 22 02/26/2009 0145   LDLCALC 75 08/07/2018 0945   LDLCALC 77 01/28/2014 1139   LDLCALC 137 (H) 01/06/2013 1311      Wt Readings from Last 3 Encounters:  08/14/18 166 lb (75.3 kg)  08/07/18 166 lb (75.3 kg)  03/13/18 161 lb (73 kg)      Other studies Reviewed: Additional studies/ records that were reviewed today include: Labs, EKG. Review of the above records demonstrates:  Please see elsewhere in the note.     ASSESSMENT AND PLAN:  HTN:   His blood pressures well controlled.  Otherwise no change in therapy.  AORTIC ATHEROSCLEROSIS: He needs continued primary risk reduction but at this point I do not think any further cardiovascular testing.  DM:    A1C is 6.9.  This is slightly lower than previous.  No change in therapy.  DYSLIPIDEMIA:     LDL is at target.  He will continue the meds as listed.  TOBACCO ABUSE: He chews Luvox tobacco and I suggested that he quit this.  RBBB: He is not having symptoms related to bradycardia arrhythmia.  His EKG is unchanged from previous.  No change in therapy.  Current medicines are reviewed at length with the patient today.  The patient does not have concerns regarding medicines.  The following changes  have been made:  no change  Labs/ tests ordered today include: None  Orders Placed This Encounter  Procedures  . EKG 12-Lead     Disposition:   FU with me as needed.      Signed, Minus Breeding, MD  08/14/2018 4:07 PM    [place]  '

## 2018-08-14 ENCOUNTER — Encounter: Payer: Self-pay | Admitting: Family Medicine

## 2018-08-14 ENCOUNTER — Ambulatory Visit (INDEPENDENT_AMBULATORY_CARE_PROVIDER_SITE_OTHER): Payer: PPO | Admitting: Cardiology

## 2018-08-14 ENCOUNTER — Encounter: Payer: Self-pay | Admitting: Cardiology

## 2018-08-14 VITALS — BP 108/70 | HR 74 | Ht 66.0 in | Wt 166.0 lb

## 2018-08-14 DIAGNOSIS — I4510 Unspecified right bundle-branch block: Secondary | ICD-10-CM

## 2018-08-14 DIAGNOSIS — I10 Essential (primary) hypertension: Secondary | ICD-10-CM

## 2018-08-14 DIAGNOSIS — I700 Atherosclerosis of aorta: Secondary | ICD-10-CM

## 2018-08-14 NOTE — Patient Instructions (Signed)
Medication Instructions:  The current medical regimen is effective;  continue present plan and medications.  If you need a refill on your cardiac medications before your next appointment, please call your pharmacy.   Follow-Up: Follow up as needed with Dr Hochrein.  Thank you for choosing [facility]!!      

## 2018-08-26 NOTE — Telephone Encounter (Signed)
Received fax notification from [employer] [place] pt assistance that pt eligible to receive Betaseron at no cost for enrollment period 08/26/18-07/17/19. Case number: [id number]. Phone number if any questions: [phone].

## 2018-08-30 HISTORY — PX: EYE SURGERY: SHX253

## 2018-09-02 ENCOUNTER — Ambulatory Visit (INDEPENDENT_AMBULATORY_CARE_PROVIDER_SITE_OTHER): Payer: PPO | Admitting: Neurology

## 2018-09-02 ENCOUNTER — Encounter: Payer: Self-pay | Admitting: Neurology

## 2018-09-02 VITALS — BP 118/65 | HR 72 | Wt 164.0 lb

## 2018-09-02 DIAGNOSIS — R261 Paralytic gait: Secondary | ICD-10-CM | POA: Diagnosis not present

## 2018-09-02 DIAGNOSIS — G35 Multiple sclerosis: Secondary | ICD-10-CM | POA: Diagnosis not present

## 2018-09-02 DIAGNOSIS — N329 Bladder disorder, unspecified: Secondary | ICD-10-CM | POA: Diagnosis not present

## 2018-09-02 DIAGNOSIS — M21371 Foot drop, right foot: Secondary | ICD-10-CM | POA: Diagnosis not present

## 2018-09-02 DIAGNOSIS — R5383 Other fatigue: Secondary | ICD-10-CM

## 2018-09-02 MED ORDER — DESMOPRESSIN ACETATE 0.1 MG PO TABS: 0.1000 mg | ORAL_TABLET | Freq: Every day | ORAL | 3 refills | Status: DC

## 2018-09-02 MED ORDER — TAMSULOSIN HCL 0.4 MG PO CAPS: 0.4000 mg | ORAL_CAPSULE | Freq: Every day | ORAL | 3 refills | Status: DC

## 2018-09-02 NOTE — Progress Notes (Signed)
[facility]  PATIENT: Jacob Fowler DOB: 11/14/1950  REFERRING DOCTOR OR PCP:  Redge Gainer  _________________________________   HISTORICAL  CHIEF COMPLAINT:  Chief Complaint  Patient presents with  . Follow-up    RM 12 with wife. Last seen 02/25/18.  Doing well, no new sx.  . Multiple Sclerosis    On Betaseron. Doing well, no SE.   . Gait Problem    Ambulates with cane. Has had no falls since last seen.    HISTORY OF PRESENT ILLNESS:  Jacob Fowler is a 68 year old man with multiple sclerosis.      Update 09/02/2018: He feels walking has mildly worsened and is slower.    He uses a cane outside and a walker inside his home.  He stumbles but no recent fall.   He uses an AFO.     He has more difficulty with some of the chores.     The right leg is weak and spastic.     Bladder is doing well with the tamsulosin and desmopressin.   Vision is unchanged.   Fatigue is doing well and he sleeps well most nights.      He denies depression or cognitive issues.   He is on Betaseron and tolerates it well.    He denies recent exacerbation.     He is not interested in other medications.     Update 02/25/2018: He feels gait is doing worse with more stumbles and some minor falls.    No injuries.  He uses a walker at night in the house and the cane the rest of the day.   Using a walker is difficult on the ground at his house.  He does a lot of chores.  He takes care of some animals. Right leg is weak and spastic.    He feels the arm is doing well.      He feels bladder is doing well on tamsulosin and desmopressin  He notes fatigue every afternoons, worse when there is more heat.    He has insomnia twice a week with more trouble falling asleep.   Occasionally, he takes greater than one hour to sleep.    He feels mood is fine.      Cognition is doing ok.   He is on Betaseron and tolerates it well with no significant skin reactions and tolerable systemic reactions.   We have discussed  other medications on several occasions (pills and IVs) he would like to stay on Betaseron.   Update 08/22/2017:    He feels he is doing fairly well. He continues on Betaseron and tolerates it well. His last CBC did show that the lymphocytes were reduced at 0.5. It was done 05/17/2017. He also notes that gait is a little worse. 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 He has had a couple falls. He uses an AFO on the right leg. Ampyra had not helped him in the past. Has more issues with his bladder. He is emptying better with the tamsulosin but he still has a lot of of urgency. He has nocturia about 5 times a night. Vision is fine.  He notes some fatigue but this is no worse than typical. He has no problems with mood or change in cognition.   From 02/19/2017: He denies any major changes though does feel his gait has worsened.   Since the last visit he has had his MRIs and blood work.  MRI's 09/04/16 were unchanged form 2016.   He has a plaque to the left at C4-C5 and to the right at T1, unchanged. He also has plaques in the brain consistent with MS.   Two are located in the brainstem.  CBC/LFTs were fine 01/02/17.   MS   He feels stable.  He is on Betaseron and he tolerates it well. We have discussed ocrelizumab as it can work for relapsing remitting as well as primary progressive MS which appears to be more consistent with his disease course  Gait/strength/sensation: He has poor gait due to right >> left leg weakness and spasticity.   He has a right AFO and it helps his foot drop some. With the cane, he can walk long distances. He does not like the walker.   He falls 1-2 times a month.  He loses balance if he takes some steps backwards.     He denies any numbness in the legs     He denies any significant numbness, weakness or clumsiness in the arms.   He he notes some spasticity in the right leg but he denies denies painful  spasms.     He tried Ampyra in the past but it  has not helped.     Bladder: He has urinary frequency,  urgency and urinary hesitancy. Tamsulosin helps.  Oxybutynin did not help much and Myrbetriq was expensive  Now he needs to urinate q 2-3 hours during the day and 0 to 1 at night.  He has not had any incontinence.     Vision: He denies any MS related visual problems. He wears glasses for correction.  Fatigue/sleep: He is active and gets outside every day. 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   His fatigue is more physical and cognitive and is present as the day goes on and with heat  Ampyra did not help. Amantadine did not help   He sually starts every day feeling well rested.    He is sleeping well most nights.   He does not snore much and his wife has not noted any apnea or gasping.   Mood/cognition:   He denies depression or anxiety. He feels his short-term memory is mildly worse than last year but notes no other cognitive issue.   MS History:   He began to note significant difficulty with gait in January 2010.      He had some back x-rays. He was then referred to neurosurgery that noted that he had plaques in his spine c/w  MS. He had further testing including MRI of the brain and spinal tap. These were all consistent with multiple sclerosis and by November 2010 he had started Betaseron. He feels that he has been mostly stable over the past 5 years but has noted that his gait is a little worse than it was in 2010. About 2013, he started using a cane.  MRI data (Images personally reviewed at previous visit) MRI Images  09/04/16 were personally reviewed and compared.  I concur with the interpretations: IMPRESSION:  This MRI of the cervical spine with and without contrast shows the following: 1.    T2 hyperintense lesions within the spinal cord to the left adjacent to C4-C5 and to the right adjacent to T1, unchanged in appearance when compared to the previous MRI.   2.    Mild degenerative changes  at C3-C4, C4-C5 and C6-C7 that did not lead to any nerve root compression. 3.  There is a normal enhancement pattern and there are no acute findings.   IMPRESSION:  This MRI of the brain with and without contrast shows the following: 1.    Multiple T2/FLAIR hyperintense foci in the periventricular, juxtacortical and deep white matter of the hemispheres and 2 foci in the brainstem in a pattern and configuration consistent with chronic demyelinating plaque associated with multiple sclerosis. None of the foci appears to be acute. When compared to the MRI dated 09/28/2014, there is no interval change. 2.   Mild to moderate cortical atrophy that has mildly progressed when compared to the previous MRI. 3.    There are no acute findings and there is a normal enhancement pattern.  REVIEW OF SYSTEMS: Constitutional: No fevers, chills, sweats, or change in appetite.  He notes fatigue most afternoon.  He has occasional insomnia. Eyes: No visual changes, double vision, eye pain Ear, nose and throat: No hearing loss, ear pain, nasal congestion, sore throat Cardiovascular: No chest pain, palpitations Respiratory: No shortness of breath at rest or with exertion.   No wheezes GastrointestinaI: No nausea, vomiting, diarrhea, abdominal pain, fecal incontinence Genitourinary: urinary frequency and hesitancy  .  No nocturia now Musculoskeletal: No neck pain, back pain Integumentary: He denies rash or skin lesions. Neurological: as above Psychiatric: No depression at this time.  No anxiety Endocrine: No palpitations, diaphoresis, change in appetite, change in weigh or increased thirst Hematologic/Lymphatic: No anemia, purpura, petechiae. Allergic/Immunologic: No itchy/runny eyes, nasal congestion, recent allergic reactions, rashes  ALLERGIES: No Known Allergies  HOME MEDICATIONS:  Current Outpatient Medications:  .  amLODipine (NORVASC) 2.5 MG tablet, TAKE ONE TABLET BY MOUTH EVERY MORNING, Disp: 90  tablet, Rfl: 1 .  aspirin (LONGS ADULT LOW STRENGTH ASA) 81 MG EC tablet, Take 81 mg by mouth daily. , Disp: , Rfl:  .  BETASERON 0.3 MG KIT injection, Inject 0.25 mg into the skin every other day., Disp: 1 kit, Rfl: 11 .  blood glucose meter kit and supplies, Dispense based on patient and insurance preference. Check BS TID and PRN  (FOR ICD dx E11.9)., Disp: 1 each, Rfl: 1 .  cholecalciferol (VITAMIN D) 1000 UNITS tablet, Take 1,000 Units by mouth daily., Disp: , Rfl:  .  cyanocobalamin (,VITAMIN B-12,) 1000 MCG/ML injection, INJECT 1ML INTO THE SKIN ONCE MONTH, Disp: 3 mL, Rfl: 3 .  desmopressin (DDAVP) 0.1 MG tablet, Take 1 tablet (0.1 mg total) by mouth daily., Disp: 90 tablet, Rfl: 2 .  doxycycline (VIBRA-TABS) 100 MG tablet, Take 1 tablet (100 mg total) by mouth daily., Disp: 90 tablet, Rfl: 3 .  fish oil-omega-3 fatty acids 1000 MG capsule, Take 2 g by mouth 2 (two) times daily., Disp: , Rfl:  .  glucose blood test strip, Use as instructed, Disp: 100 each, Rfl: 12 .  isosorbide mononitrate (IMDUR) 60 MG 24 hr tablet, Take 0.5 tablets (30 mg total) by mouth daily., Disp: 45 tablet, Rfl: 0 .  levothyroxine (SYNTHROID, LEVOTHROID) 50 MCG tablet, TAKE ONE TABLET BY MOUTH DAILY BEFORE BREAKFAST, Disp: 90 tablet, Rfl: 3 .  lisinopril (PRINIVIL,ZESTRIL) 10 MG tablet, Take 1 tablet (10 mg total) by mouth daily., Disp: 90 tablet, Rfl: 3 .  metFORMIN (GLUCOPHAGE) 500 MG tablet, TAKE TWO TABLETS BY MOUTH TWICE DAILY WITH MEALS., Disp: 120 tablet, Rfl: 1 .  omeprazole (PRILOSEC) 20 MG capsule, Take 20 mg by mouth daily., Disp: , Rfl:  .  pioglitazone (ACTOS) 30 MG tablet, TAKE ONE TABLET BY MOUTH DAILY, Disp: 90  tablet, Rfl: 0 .  rosuvastatin (CRESTOR) 20 MG tablet, Take 1 tablet (20 mg total) by mouth daily., Disp: 90 tablet, Rfl: 0 .  sildenafil (VIAGRA) 100 MG tablet, Take 1 tablet (100 mg total) by mouth daily as needed for erectile dysfunction., Disp: 12 tablet, Rfl: 2 .  SYRINGE-NEEDLE, DISP, 3 ML  (B-D SYRINGE/NEEDLE 3CC/25GX5/8) 25G X 5/8" 3 ML MISC, Use to give once a month B12 injections, Disp: 12 each, Rfl: 0 .  tamsulosin (FLOMAX) 0.4 MG CAPS capsule, Take 1 capsule (0.4 mg total) by mouth daily after supper., Disp: 90 capsule, Rfl: 2  Current Facility-Administered Medications:  .  cyanocobalamin ((VITAMIN B-12)) injection 1,000 mcg, 1,000 mcg, Intramuscular, Q30 days, Chipper Herb, MD, 1,000 mcg at 03/02/15 1647  PAST MEDICAL HISTORY: Past Medical History:  Diagnosis Date  . Acquired leg length discrepancy   . Bladder stone   . BPH associated with nocturia   . CAD (coronary artery disease) CARDIOLOGIST- DR HOCHREIN--  LAST VISIT 09-27-2011 IN [facility]   Non obstructive Cath 2010-  HX CORONARY SPASM 2004  . Cataract    bilateral   . GERD (gastroesophageal reflux disease)   . H/O hiatal hernia   . History of nephrolithiasis 2009  . Hx of adenomatous colonic polyps   . Hyperlipidemia   . Hypertension   . Movement disorder   . Multiple sclerosis ([place]) DX  10/10---  NEUROLOGIST  DR Delfino Lovett [facility] ([place])   RIGHT SIDE AFFECTED MORE W/ WEAKNESS- - USES CANE  . Neuromuscular disorder (HCC)    MS  . NIDDM (non-insulin dependent diabetes mellitus)   . Peyronie's disease   . RBBB   . Renal stone RIGHT  . Right ureteral stone   . Rosacea   . Vision abnormalities     PAST SURGICAL HISTORY: Past Surgical History:  Procedure Laterality Date  . CARDIAC CATHETERIZATION  02-26-2009 ---  DR Ida Rogue   NONOBSTRUCTIVE CAD/ 50% DISTAL RCA/ 30% LAD / NORMAL LVF  . CYSTOSCOPY W/ RETROGRADES  10/30/2011   Procedure: CYSTOSCOPY WITH RETROGRADE PYELOGRAM;  Surgeon: Claybon Jabs, MD;  Location: [facility];  Service: Urology;  Laterality: Right;  . CYSTOSCOPY WITH LITHOLAPAXY  10/30/2011   Procedure: CYSTOSCOPY WITH LITHOLAPAXY;  Surgeon: Claybon Jabs, MD;  Location: [facility];  Service: Urology;  Laterality: N/A;  . EXTRACORPOREAL SHOCK WAVE  LITHOTRIPSY  09-05-2010   RIGHT  . NESBIT PROCEDURE  10-17-2004   CORRECTION OF PENILE ANGULATION (PEYRONIES DISEASE)    FAMILY HISTORY: Family History  Problem Relation Age of Onset  . Stroke Mother   . Heart attack Mother   . Diabetes Mother   . Hypertension Mother   . Renal cancer Father   . Diabetes Sister   . Heart attack Brother   . Diabetes Son   . Heart attack Maternal Grandfather   . Heart attack Brother   . Bladder Cancer Brother     SOCIAL HISTORY:  Social History   Socioeconomic History  . Marital status: Married    Spouse name: Hassan Rowan  . Number of children: 2  . Years of education: Not on file  . Highest education level: Not on file  Occupational History  . Occupation: retired    Comment: self -employed  Social Needs  . Financial resource strain: Not on file  . Food insecurity:    Worry: Not on file    Inability: Not on file  . Transportation needs:  Medical: Not on file    Non-medical: Not on file  Tobacco Use  . Smoking status: Former Smoker    Packs/day: 1.00    Years: 7.00    Pack years: 7.00    Types: Cigarettes    Last attempt to quit: 09/26/1973    Years since quitting: 44.9  . Smokeless tobacco: Current User    Types: Chew  . Tobacco comment: CHEW TOBACCO FOR 35 YRS  Substance and Sexual Activity  . Alcohol use: No    Alcohol/week: 0.0 standard drinks  . Drug use: No  . Sexual activity: Not on file  Lifestyle  . Physical activity:    Days per week: Not on file    Minutes per session: Not on file  . Stress: Not on file  Relationships  . Social connections:    Talks on phone: Not on file    Gets together: Not on file    Attends religious service: Not on file    Active member of club or organization: Not on file    Attends meetings of clubs or organizations: Not on file    Relationship status: Not on file  . Intimate partner violence:    Fear of current or ex partner: Not on file    Emotionally abused: Not on file     Physically abused: Not on file    Forced sexual activity: Not on file  Other Topics Concern  . Not on file  Social History Narrative  . Not on file     PHYSICAL EXAM  Vitals:   09/02/18 0819  BP: 118/65  Pulse: 72  SpO2: 98%  Weight: 164 lb (74.4 kg)    Body mass index is 26.47 kg/m.   General: The patient is well-developed and well-nourished and in no acute distress  Neurologic Exam  Mental status: The patient is alert and oriented x 3 at the time of the examination. The patient has apparent normal recent and remote memory, with an apparently normal attention span and concentration ability.   Speech is normal.  Cranial nerves: Extraocular movements are full.   There is good facial sensation to soft touch bilaterally.Facial strength is normal.  Trapezius and sternocleidomastoid strength is normal. No dysarthria is noted.  The tongue is midline, and the patient has symmetric elevation of the soft palate. No obvious hearing deficits are noted.  Motor:  Muscle bulk is normal.   There is increased muscle tone in the legs, right greater than left.  The strength was 2/5 in the right and 4/5 in the left hip flexures. Strength was 3/5 in the right quad and 4+/5 on the left. Distally strength was 4 minus in the right leg and 4 in the left leg. Strength was 5/5 in the arms.     Sensory: Sensory testing is intact to touch and vibration sensation in all 4 extremities.  Coordination: Cerebellar testing shows good finger-nose-finger.  Heel-to-shin is poor on the left and he cannot do on the right. .    Gait and station: Station is normal.  The gait is spastic.  There is a severe right foot drop helped by an AFO.  The leg is externally rotated and the knee is hyperextended.  He cannot tandem walk.  Romberg is negative.   Reflexes:  Deep tendon reflexes were normal in the arms.  Deep tendon reflexes are increased in both legs, more on the right where he has a crossed adductor  response.    DIAGNOSTIC DATA (LABS,  IMAGING, TESTING) - I reviewed patient records, labs, notes, testing and imaging myself where available.  Lab Results  Component Value Date   WBC 8.7 08/07/2018   HGB 12.4 (L) 08/07/2018   HCT 37.9 08/07/2018   MCV 84 08/07/2018   PLT 211 08/07/2018      Component Value Date/Time   NA 141 08/07/2018 0945   K 4.3 08/07/2018 0945   CL 102 08/07/2018 0945   CO2 22 08/07/2018 0945   GLUCOSE 137 (H) 08/07/2018 0945   GLUCOSE 130 (H) 01/06/2013 1311   BUN 16 08/07/2018 0945   CREATININE 1.11 08/07/2018 0945   CREATININE 1.13 01/06/2013 1311   CALCIUM 9.8 08/07/2018 0945   PROT 7.2 08/07/2018 0945   ALBUMIN 4.6 08/07/2018 0945   AST 24 08/07/2018 0945   ALT 21 08/07/2018 0945   ALKPHOS 72 08/07/2018 0945   BILITOT 0.3 08/07/2018 0945   GFRNONAA 68 08/07/2018 0945   GFRNONAA 69 01/06/2013 1311   GFRAA 78 08/07/2018 0945   GFRAA 80 01/06/2013 1311   Lab Results  Component Value Date   CHOL 121 08/07/2018   HDL 31 (L) 08/07/2018   LDLCALC 75 08/07/2018   TRIG 75 08/07/2018   CHOLHDL 3.9 08/07/2018   Lab Results  Component Value Date   HGBA1C 6.9 08/07/2018        ASSESSMENT AND PLAN  Multiple sclerosis (HCC)  Bladder disorder  Spastic gait  Other fatigue  Right foot drop   1.   We had discussed other treatments including the oral agents and Ocrevus.  He is potentially interested in 1 of the oral agents and I gave him more information on Aubagio. 2.   He will continue the bladder medications.   3.   Continue to use a cane or walker. 4.  He will return to see me in 6 months or sooner if he has new or worsening neurologic symptoms.    A. Felecia Shelling, MD, PhD 0/31/2811, 8:86 AM Certified in Neurology, Clinical Neurophysiology, Sleep Medicine, Pain Medicine and Neuroimaging  [facility] [address], [place], [place] [address] [phone]

## 2018-09-27 ENCOUNTER — Other Ambulatory Visit: Payer: Self-pay

## 2018-09-27 MED ORDER — METFORMIN HCL 500 MG PO TABS: ORAL_TABLET | ORAL | 0 refills | Status: DC

## 2018-10-03 ENCOUNTER — Encounter: Payer: PPO | Admitting: *Deleted

## 2018-10-09 ENCOUNTER — Other Ambulatory Visit: Payer: Self-pay

## 2018-10-09 MED ORDER — ISOSORBIDE MONONITRATE ER 60 MG PO TB24: 30.0000 mg | ORAL_TABLET | Freq: Every day | ORAL | 0 refills | Status: DC

## 2018-10-16 ENCOUNTER — Ambulatory Visit: Payer: PPO | Admitting: Cardiology

## 2018-10-23 ENCOUNTER — Other Ambulatory Visit: Payer: Self-pay | Admitting: *Deleted

## 2018-10-23 MED ORDER — PIOGLITAZONE HCL 30 MG PO TABS: 30.0000 mg | ORAL_TABLET | Freq: Every day | ORAL | 0 refills | Status: DC

## 2018-10-28 ENCOUNTER — Other Ambulatory Visit: Payer: Self-pay

## 2018-11-06 ENCOUNTER — Other Ambulatory Visit: Payer: Self-pay | Admitting: *Deleted

## 2018-11-06 MED ORDER — ISOSORBIDE MONONITRATE ER 60 MG PO TB24: 30.0000 mg | ORAL_TABLET | Freq: Every day | ORAL | 0 refills | Status: DC

## 2018-11-11 ENCOUNTER — Other Ambulatory Visit: Payer: Self-pay | Admitting: *Deleted

## 2018-11-11 MED ORDER — METFORMIN HCL 500 MG PO TABS: ORAL_TABLET | ORAL | 1 refills | Status: DC

## 2018-11-12 ENCOUNTER — Other Ambulatory Visit: Payer: Self-pay

## 2018-11-12 ENCOUNTER — Ambulatory Visit (INDEPENDENT_AMBULATORY_CARE_PROVIDER_SITE_OTHER): Payer: PPO | Admitting: *Deleted

## 2018-11-12 ENCOUNTER — Encounter: Payer: Self-pay | Admitting: *Deleted

## 2018-11-12 DIAGNOSIS — Z0000 Encounter for general adult medical examination without abnormal findings: Secondary | ICD-10-CM

## 2018-11-12 NOTE — Progress Notes (Addendum)
MEDICARE ANNUAL WELLNESS VISIT  11/12/2018  Telephone Visit Disclaimer This Medicare AWV was conducted by telephone due to national recommendations for restrictions regarding the COVID-19 Pandemic (e.g. social distancing).  I verified, using two identifiers, that I am speaking with Jacob Fowler or their authorized healthcare agent. I discussed the limitations, risks, security, and privacy concerns of performing an evaluation and management service by telephone and the potential availability of an in-person appointment in the future. The patient expressed understanding and agreed to proceed.   Subjective:   Jacob Fowler is a 68 y.o. male patient of Chipper Herb, MD who had a Medicare Annual Wellness Visit today via telephone. 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 he has 2 children, and 1 grandchild. he reports that he is socially active and does interact with friends and/or family regularly.  Jacob Fowler usually attends church regularly.  he is moderately physically active and enjoys growing and shelling pecans and walnuts, and walking daily.  Patient Care Team: Chipper Herb, MD as PCP - General (Family Medicine) Sater, Nanine Means, MD (Neurology) Kathie Rhodes, MD as Consulting Physician (Urology) Minus Breeding, MD as Consulting Physician (Cardiology)  [facility] Utilization Over the Past 12 Months: # of hospitalizations or ER visits: 0 # of surgeries: 1 - Right cataract surgery 08/30/2018  Review of Systems    Patient reports that his overall health is unchanged compared to last year.    Review of Systems:  All systems negative today per patient's report.      Current Medications (verified) Allergies as of 11/12/2018   No Known Allergies     Medication List       Accurate as of November 12, 2018  8:46 AM. Always use your most recent med list.        amLODipine 2.5 MG tablet Commonly known as:  NORVASC TAKE ONE TABLET BY MOUTH EVERY MORNING   Betaseron  0.3 MG Kit injection Generic drug:  Interferon Beta-1b Inject 0.25 mg into the skin every other day.   blood glucose meter kit and supplies Dispense based on patient and insurance preference. Check BS TID and PRN  (FOR ICD dx E11.9).   cholecalciferol 1000 units tablet Commonly known as:  VITAMIN D Take 1,000 Units by mouth daily.   cyanocobalamin 1000 MCG/ML injection Commonly known as:  (VITAMIN B-12) INJECT 1ML INTO THE SKIN ONCE MONTH   desmopressin 0.1 MG tablet Commonly known as:  DDAVP Take 1 tablet (0.1 mg total) by mouth daily.   doxycycline 100 MG tablet Commonly known as:  VIBRA-TABS Take 1 tablet (100 mg total) by mouth daily.   fish oil-omega-3 fatty acids 1000 MG capsule Take 2 g by mouth 2 (two) times daily.   glucose blood test strip Use as instructed   isosorbide mononitrate 60 MG 24 hr tablet Commonly known as:  IMDUR Take 0.5 tablets (30 mg total) by mouth daily.   levothyroxine 50 MCG tablet Commonly known as:  SYNTHROID TAKE ONE TABLET BY MOUTH DAILY BEFORE BREAKFAST   lisinopril 10 MG tablet Commonly known as:  ZESTRIL Take 1 tablet (10 mg total) by mouth daily.   [place] Adult Low Strength ASA 81 MG EC tablet Generic drug:  aspirin Take 81 mg by mouth daily.   metFORMIN 500 MG tablet Commonly known as:  GLUCOPHAGE TAKE TWO TABLETS BY MOUTH TWICE DAILY WITH MEALS.   omeprazole 20 MG capsule Commonly known as:  PRILOSEC Take 20 mg by  mouth daily.   pioglitazone 30 MG tablet Commonly known as:  ACTOS Take 1 tablet (30 mg total) by mouth daily.   rosuvastatin 20 MG tablet Commonly known as:  CRESTOR Take 1 tablet (20 mg total) by mouth daily.   sildenafil 100 MG tablet Commonly known as:  VIAGRA Take 1 tablet (100 mg total) by mouth daily as needed for erectile dysfunction.   SYRINGE-NEEDLE (DISP) 3 ML 25G X 5/8" 3 ML Misc Commonly known as:  B-D SYRINGE/NEEDLE 3CC/25GX5/8 Use to give once a month B12 injections   tamsulosin 0.4 MG  Caps capsule Commonly known as:  FLOMAX Take 1 capsule (0.4 mg total) by mouth daily after supper.       Allergies (verified) Patient has no known allergies.   History (reviewed): Past Medical History:  Diagnosis Date  . Acquired leg length discrepancy   . Bladder stone   . BPH associated with nocturia   . CAD (coronary artery disease) CARDIOLOGIST- DR HOCHREIN--  LAST VISIT 09-27-2011 IN EPIC   Non obstructive Cath 2010-  HX CORONARY SPASM 2004  . Cataract    bilateral   . GERD (gastroesophageal reflux disease)   . H/O hiatal hernia   . History of nephrolithiasis 2009  . Hx of adenomatous colonic polyps   . Hyperlipidemia   . Hypertension   . Movement disorder   . Multiple sclerosis ([place]) DX  10/10---  NEUROLOGIST  DR Delfino Lovett [facility] ([place])   RIGHT SIDE AFFECTED MORE W/ WEAKNESS- - USES CANE  . Neuromuscular disorder ([facility])    MS  . NIDDM (non-insulin dependent diabetes mellitus)   . Peyronie's disease   . RBBB   . Renal stone RIGHT  . Right ureteral stone   . Rosacea   . Vision abnormalities    Past Surgical History:  Procedure Laterality Date  . CARDIAC CATHETERIZATION  02-26-2009 ---  DR Ida Rogue   NONOBSTRUCTIVE CAD/ 50% DISTAL RCA/ 30% LAD / NORMAL LVF  . CYSTOSCOPY W/ RETROGRADES  10/30/2011   Procedure: CYSTOSCOPY WITH RETROGRADE PYELOGRAM;  Surgeon: Claybon Jabs, MD;  Location: [facility];  Service: Urology;  Laterality: Right;  . CYSTOSCOPY WITH LITHOLAPAXY  10/30/2011   Procedure: CYSTOSCOPY WITH LITHOLAPAXY;  Surgeon: Claybon Jabs, MD;  Location: [facility];  Service: Urology;  Laterality: N/A;  . EXTRACORPOREAL SHOCK WAVE LITHOTRIPSY  09-05-2010   RIGHT  . EYE SURGERY Right 08/30/2018  . NESBIT PROCEDURE  10-17-2004   CORRECTION OF PENILE ANGULATION (PEYRONIES DISEASE)   Family History  Problem Relation Age of Onset  . Stroke Mother   . Heart attack Mother   . Diabetes Mother   . Hypertension Mother   .  Renal cancer Father   . Diabetes Sister   . Heart attack Brother   . Diabetes Son   . Heart attack Maternal Grandfather   . Heart attack Brother   . Bladder Cancer Brother    Social History   Socioeconomic History  . Marital status: Married    Spouse name: Hassan Rowan  . Number of children: 2  . Years of education: 64  . Highest education level: Some college, no degree  Occupational History  . Occupation: retired    Comment: self -employed farmer  Social Needs  . Financial resource strain: Not hard at all  . Food insecurity:    Worry: Never true    Inability: Never true  . Transportation needs:    Medical: No  Non-medical: No  Tobacco Use  . Smoking status: Former Smoker    Packs/day: 1.00    Years: 7.00    Pack years: 7.00    Types: Cigarettes    Last attempt to quit: 09/26/1973    Years since quitting: 45.1  . Smokeless tobacco: Current User    Types: Chew  . Tobacco comment: Chewing tobacco  Substance and Sexual Activity  . Alcohol use: No    Alcohol/week: 0.0 standard drinks  . Drug use: No  . Sexual activity: Not on file  Lifestyle  . Physical activity:    Days per week: 6 days    Minutes per session: 40 min  . Stress: Only a little  Relationships  . Social connections:    Talks on phone: More than three times a week    Gets together: More than three times a week    Attends religious service: More than 4 times per year    Active member of club or organization: No    Attends meetings of clubs or organizations: Never    Relationship status: Married  Other Topics Concern  . Not on file  Social History Narrative  . Not on file     Clinical Intake    Pain Score: 0-No pain                  Activities of Daily Living In your present state of health, do you have any difficulty performing the following activities: 11/12/2018  Hearing? N  Vision? N  Difficulty concentrating or making decisions? N  Walking or climbing stairs? Y  Comment due to  balance, uses walker as needed  Dressing or bathing? N  Doing errands, shopping? Y  Comment Wife goes with him  Conservation officer, nature and eating ? N  Using the Toilet? N  In the past six months, have you accidently leaked urine? N  Do you have problems with loss of bowel control? N  Managing your Medications? N  Managing your Finances? N  Housekeeping or managing your Housekeeping? N  Some recent data might be hidden     Exercise Current Exercise Habits: Home exercise routine, Type of exercise: walking, Time (Minutes): 45, Frequency (Times/Week): 6, Weekly Exercise (Minutes/Week): 270, Intensity: Moderate, Exercise limited by: neurologic condition(s)  Diet Patient reports consuming 3 meals a day and 1 snack(s) a day Patient reports that his primary diet is: Regular  Recommended diet of mostly non-starchy vegetables, lean proteins, and fruits and whole grains in moderation.  Depression Screen PHQ 2/9 Scores 11/12/2018 08/07/2018 03/13/2018 11/26/2017 10/22/2017 10/04/2017 07/05/2017  PHQ - 2 Score 0 0 0 0 0 0 0     Fall Risk Fall Risk  11/12/2018 08/07/2018 03/13/2018 11/26/2017 10/22/2017  Falls in the past year? 1 0 Yes Yes Yes  Number falls in past yr: 1 - 2 or more 2 or more 2 or more  Injury with Fall? 0 - No No No  Risk for fall due to : History of fall(s);Impaired balance/gait;Impaired mobility - - - -  Follow up Education provided;Falls prevention discussed - - - -     Objective:      Last 3 BP Readings Last Weight Last BMI  BP Readings from Last 3 Encounters:  09/02/18 118/65  08/14/18 108/70  08/07/18 124/71   Wt Readings from Last 3 Encounters:  09/02/18 164 lb (74.4 kg)  08/14/18 166 lb (75.3 kg)  08/07/18 166 lb (75.3 kg)   BMI Readings from Last 1  Encounters:  09/02/18 26.47 kg/m    *Unable to obtain current vital signs, weight, and BMI due to telephone visit type  Jacob Fowler seemed alert and oriented and he participated appropriately during our telephone visit.   Advanced Directives 11/12/2018 07/05/2017 10/30/2011 10/25/2011  Does Patient Have a Medical Advance Directive? Yes Yes Patient has advance directive, copy not in chart Patient has advance directive, copy in chart  Type of Advance Directive [place];Living will Living will;Healthcare Power of Attorney Living will Living will  Does patient want to make changes to medical advance directive? - No - Patient declined - -  Copy of [place] in Chart? No - copy requested No - copy requested Copy requested from family -  Pre-existing out of facility DNR order (yellow form or pink MOST form) - - No -    Hearing/Vision  . Jacob Fowler did not seem to have difficulty with hearing/understanding during the telephone conversation . Reports that he has had a formal eye exam by an eye care professional within the past year- Dr. Leonia Corona . Reports that he has not had a formal hearing evaluation within the past year *Unable to fully assess hearing and vision during telephone visit type  Cognitive Function: 6CIT Screen 11/12/2018  What Year? 0 points  What month? 0 points  What time? 0 points  Count back from 20 0 points  Months in reverse 0 points  Repeat phrase 0 points  Total Score 0    Normal Cognitive Function Screening: Yes (Normal:0-7, Significant for Dysfunction: >8)  Immunization & Health Maintenance Record Immunization History  Administered Date(s) Administered  . Influenza, High Dose Seasonal PF 04/10/2016, 05/17/2017, 06/10/2018  . Influenza,inj,Quad PF,6+ Mos 05/14/2013, 05/27/2014, 07/05/2015  . Pneumococcal Conjugate-13 05/27/2013  . Pneumococcal Polysaccharide-23 04/15/2010, 10/04/2017  . Td 02/16/2014  . Tdap 02/16/2014    Health Maintenance  Topic Date Due  . Hepatitis C Screening  1951-01-23  . OPHTHALMOLOGY EXAM  08/31/2018  . HEMOGLOBIN A1C  02/05/2019  . INFLUENZA VACCINE  02/15/2019  . COLON CANCER SCREENING ANNUAL FOBT  04/09/2019   . FOOT EXAM  08/08/2019  . TETANUS/TDAP  02/17/2024  . COLONOSCOPY  01/01/2028  . PNA vac Low Risk Adult  Completed       Assessment:   This is a routine wellness examination for Jacob Fowler.  Health Maintenance Due  Health Maintenance Due  Topic Date Due  . Hepatitis C Screening  1950/08/25  . OPHTHALMOLOGY EXAM  08/31/2018     Plan:    Personalized Goals Goals Addressed            This Visit's Progress   . DIET - INCREASE WATER INTAKE       Increase water intake to 8 glasses per day - 64 oz.     . Exercise 3x per week (30 min per time)   On track    Pt would like to "get around better"       Personalized Health Maintenance & Screening Recommendations  Smoking cessation counseling Advanced directives: has an advanced directive - a copy HAS NOT been provided.  Lung Cancer Screening Recommended: no (Low Dose CT Chest recommended if Age 59-80 years, 30 pack-year currently smoking OR have quit w/in past 15 years) Hepatitis C Screening recommended: yes, recommend at next visit with Dr. Laurance Flatten.   Advanced Directives: Written information was not prepared per patient's request.  Patient already has Advanced Directives.  Requested a copy to  file in patient's medical record.    Follow-up Plan . Follow-up with Chipper Herb, MD as planned . Schedule diabetic eye exam . Have hepatitis C screening at next visit with Dr. Laurance Flatten .   I have personally reviewed and noted the following in the patient's chart:   . Medical and social history . Use of alcohol, tobacco or illicit drugs  . Current medications and supplements . Functional ability and status . Nutritional status . Physical activity . Advanced directives . List of other physicians . Hospitalizations, surgeries, and ER visits in previous 12 months . Vitals . Screenings to include cognitive, depression, and falls . Referrals and appointments  In addition, I have reviewed and discussed with Jacob Fowler certain preventive protocols, quality metrics, and best practice recommendations. A written personalized care plan for preventive services as well as general preventive health recommendations is available and can be mailed to the patient at his request.       Nolberto Hanlon, RN 11/12/2018

## 2018-11-12 NOTE — Patient Instructions (Signed)
  Jacob Fowler , Thank you for taking time to talk with me for your Medicare Wellness Visit. I appreciate your ongoing commitment to your health goals. Please review the following plan we discussed and let me know if I can assist you in the future.   These are the goals we discussed: Goals    . DIET - INCREASE WATER INTAKE     Increase water intake to 8 glasses per day - 64 oz.     . Exercise 3x per week (30 min per time)     Pt would like to "get around better"        This is a list of the screening recommended for you and due dates:  Health Maintenance  Topic Date Due  .  Hepatitis C: One time screening is recommended by Center for Disease Control  (CDC) for  adults born from [birth year] through 1966.   [birth year]  . Eye exam for diabetics  08/31/2018  . Hemoglobin A1C  02/05/2019  . Flu Shot  02/15/2019  . Stool Blood Test  04/09/2019  . Complete foot exam   08/08/2019  . Tetanus Vaccine  02/17/2024  . Colon Cancer Screening  01/01/2028  . Pneumonia vaccines  Completed

## 2018-12-10 ENCOUNTER — Other Ambulatory Visit: Payer: Self-pay | Admitting: Family Medicine

## 2018-12-18 ENCOUNTER — Other Ambulatory Visit: Payer: Self-pay

## 2018-12-18 ENCOUNTER — Encounter: Payer: Self-pay | Admitting: Family Medicine

## 2018-12-18 ENCOUNTER — Ambulatory Visit (INDEPENDENT_AMBULATORY_CARE_PROVIDER_SITE_OTHER): Payer: PPO | Admitting: Family Medicine

## 2018-12-18 DIAGNOSIS — N400 Enlarged prostate without lower urinary tract symptoms: Secondary | ICD-10-CM

## 2018-12-18 DIAGNOSIS — D510 Vitamin B12 deficiency anemia due to intrinsic factor deficiency: Secondary | ICD-10-CM | POA: Diagnosis not present

## 2018-12-18 DIAGNOSIS — G35 Multiple sclerosis: Secondary | ICD-10-CM | POA: Diagnosis not present

## 2018-12-18 DIAGNOSIS — R972 Elevated prostate specific antigen [PSA]: Secondary | ICD-10-CM

## 2018-12-18 DIAGNOSIS — I700 Atherosclerosis of aorta: Secondary | ICD-10-CM | POA: Diagnosis not present

## 2018-12-18 DIAGNOSIS — E119 Type 2 diabetes mellitus without complications: Secondary | ICD-10-CM | POA: Diagnosis not present

## 2018-12-18 DIAGNOSIS — E559 Vitamin D deficiency, unspecified: Secondary | ICD-10-CM | POA: Diagnosis not present

## 2018-12-18 DIAGNOSIS — E785 Hyperlipidemia, unspecified: Secondary | ICD-10-CM

## 2018-12-18 DIAGNOSIS — D696 Thrombocytopenia, unspecified: Secondary | ICD-10-CM | POA: Diagnosis not present

## 2018-12-18 DIAGNOSIS — G35D Multiple sclerosis, unspecified: Secondary | ICD-10-CM

## 2018-12-18 DIAGNOSIS — I10 Essential (primary) hypertension: Secondary | ICD-10-CM | POA: Diagnosis not present

## 2018-12-18 DIAGNOSIS — E1169 Type 2 diabetes mellitus with other specified complication: Secondary | ICD-10-CM | POA: Diagnosis not present

## 2018-12-18 MED ORDER — AMLODIPINE BESYLATE 2.5 MG PO TABS: 2.5000 mg | ORAL_TABLET | Freq: Every morning | ORAL | 3 refills | Status: DC

## 2018-12-18 MED ORDER — ISOSORBIDE MONONITRATE ER 60 MG PO TB24: 30.0000 mg | ORAL_TABLET | Freq: Every day | ORAL | 3 refills | Status: DC

## 2018-12-18 MED ORDER — LEVOTHYROXINE SODIUM 50 MCG PO TABS: ORAL_TABLET | ORAL | 3 refills | Status: DC

## 2018-12-18 MED ORDER — LISINOPRIL 10 MG PO TABS: 10.0000 mg | ORAL_TABLET | Freq: Every day | ORAL | 3 refills | Status: DC

## 2018-12-18 MED ORDER — TAMSULOSIN HCL 0.4 MG PO CAPS: 0.4000 mg | ORAL_CAPSULE | Freq: Every day | ORAL | 3 refills | Status: DC

## 2018-12-18 MED ORDER — OMEPRAZOLE 20 MG PO CPDR: 20.0000 mg | DELAYED_RELEASE_CAPSULE | Freq: Every day | ORAL | 3 refills | Status: DC

## 2018-12-18 MED ORDER — PIOGLITAZONE HCL 30 MG PO TABS: 30.0000 mg | ORAL_TABLET | Freq: Every day | ORAL | 3 refills | Status: DC

## 2018-12-18 MED ORDER — METFORMIN HCL 500 MG PO TABS: ORAL_TABLET | ORAL | 3 refills | Status: DC

## 2018-12-18 MED ORDER — ICOSAPENT ETHYL 1 G PO CAPS: 2.0000 | ORAL_CAPSULE | Freq: Two times a day (BID) | ORAL | 3 refills | Status: DC

## 2018-12-18 NOTE — Progress Notes (Signed)
Virtual Visit Via telephone Note I connected with@ on 12/18/18 by telephone and verified that I am speaking with the correct person or authorized healthcare agent using two identifiers. Jacob Fowler is currently located at home and there are no unauthorized people in close proximity. I completed this visit while in a private location in my home .  This visit type was conducted due to national recommendations for restrictions regarding the COVID-19 Pandemic (e.g. social distancing).  This format is felt to be most appropriate for this patient at this time.  All issues noted in this document were discussed and addressed.  No physical exam was performed.    I discussed the limitations, risks, security and privacy concerns of performing an evaluation and management service by telephone and the availability of in person appointments. I also discussed with the patient that there may be a patient responsible charge related to this service. The patient expressed understanding and agreed to proceed.   Date:  12/18/2018    ID:  Bufford Lope Faulcon      12/21/1950        [id number]   Patient Care Team Patient Care Team: Chipper Herb, MD as PCP - General (Family Medicine) Sater, Nanine Means, MD (Neurology) Kathie Rhodes, MD as Consulting Physician (Urology) Minus Breeding, MD as Consulting Physician (Cardiology)  Reason for Visit: Primary Care Follow-up     History of Present Illness & Review of Systems:     Jacob Fowler is a 68 y.o. year old male primary care patient that presents today for a telehealth visit.  Is pleasant and doing well other than dealing with his MS and he does see the neurologist, Dr. Felecia Shelling regularly.  He has had a few falls but he says the falls are easy and this is because of his weakness and he does not use a cane.  Denies any chest pain pressure tightness or shortness of breath.  He denies any trouble with swallowing heartburn indigestion nausea vomiting diarrhea or  blood in the stool.  He is passing his water well and still taking Flomax and has not seen Dr. Ivette Loyal in a couple of years.  He has had occasional falls as mentioned.  He sees Dr. Felecia Shelling regularly and has seen Dr. Percival Spanish in January and follows with him regularly.  Review of systems as stated, otherwise negative.  The patient does not have symptoms concerning for COVID-19 infection (fever, chills, cough, or new shortness of breath).      Current Medications (Verified) Allergies as of 12/18/2018   No Known Allergies     Medication List       Accurate as of December 18, 2018  9:06 AM. If you have any questions, ask your nurse or doctor.        amLODipine 2.5 MG tablet Commonly known as:  NORVASC TAKE ONE TABLET BY MOUTH EVERY MORNING   Betaseron 0.3 MG Kit injection Generic drug:  Interferon Beta-1b Inject 0.25 mg into the skin every other day.   blood glucose meter kit and supplies Dispense based on patient and insurance preference. Check BS TID and PRN  (FOR ICD dx E11.9).   cholecalciferol 1000 units tablet Commonly known as:  VITAMIN D Take 1,000 Units by mouth daily.   cyanocobalamin 1000 MCG/ML injection Commonly known as:  (VITAMIN B-12) INJECT 1ML INTO THE SKIN ONCE MONTH   desmopressin 0.1 MG tablet Commonly known as:  DDAVP Take 1 tablet (0.1 mg total) by  mouth daily.   doxycycline 100 MG tablet Commonly known as:  VIBRA-TABS Take 1 tablet (100 mg total) by mouth daily.   fish oil-omega-3 fatty acids 1000 MG capsule Take 2 g by mouth 2 (two) times daily.   glucose blood test strip Use as instructed   isosorbide mononitrate 60 MG 24 hr tablet Commonly known as:  IMDUR Take 0.5 tablets (30 mg total) by mouth daily.   levothyroxine 50 MCG tablet Commonly known as:  SYNTHROID TAKE ONE TABLET BY MOUTH DAILY BEFORE BREAKFAST   lisinopril 10 MG tablet Commonly known as:  ZESTRIL Take 1 tablet (10 mg total) by mouth daily.   Longs Adult Low Strength ASA 81 MG  EC tablet Generic drug:  aspirin Take 81 mg by mouth daily.   metFORMIN 500 MG tablet Commonly known as:  GLUCOPHAGE TAKE TWO TABLETS BY MOUTH TWICE DAILY WITH MEALS.   omeprazole 20 MG capsule Commonly known as:  PRILOSEC Take 20 mg by mouth daily.   pioglitazone 30 MG tablet Commonly known as:  ACTOS TAKE ONE TABLET BY MOUTH DAILY   rosuvastatin 20 MG tablet Commonly known as:  CRESTOR Take 1 tablet (20 mg total) by mouth daily.   sildenafil 100 MG tablet Commonly known as:  VIAGRA Take 1 tablet (100 mg total) by mouth daily as needed for erectile dysfunction.   SYRINGE-NEEDLE (DISP) 3 ML 25G X 5/8" 3 ML Misc Commonly known as:  B-D SYRINGE/NEEDLE 3CC/25GX5/8 Use to give once a month B12 injections   tamsulosin 0.4 MG Caps capsule Commonly known as:  FLOMAX Take 1 capsule (0.4 mg total) by mouth daily after supper.           Allergies (Verified)    Patient has no known allergies.  Past Medical History Past Medical History:  Diagnosis Date  . Acquired leg length discrepancy   . Bladder stone   . BPH associated with nocturia   . CAD (coronary artery disease) CARDIOLOGIST- DR HOCHREIN--  LAST VISIT 09-27-2011 IN [facility]   Non obstructive Cath 2010-  HX CORONARY SPASM 2004  . Cataract    bilateral   . GERD (gastroesophageal reflux disease)   . H/O hiatal hernia   . History of nephrolithiasis 2009  . Hx of adenomatous colonic polyps   . Hyperlipidemia   . Hypertension   . Movement disorder   . Multiple sclerosis ([place]) DX  10/10---  NEUROLOGIST  DR Delfino Lovett [facility] ([place])   RIGHT SIDE AFFECTED MORE W/ WEAKNESS- - USES CANE  . Neuromuscular disorder (HCC)    MS  . NIDDM (non-insulin dependent diabetes mellitus)   . Peyronie's disease   . RBBB   . Renal stone RIGHT  . Right ureteral stone   . Rosacea   . Vision abnormalities      Past Surgical History:  Procedure Laterality Date  . CARDIAC CATHETERIZATION  02-26-2009 ---  DR Ida Rogue    NONOBSTRUCTIVE CAD/ 50% DISTAL RCA/ 30% LAD / NORMAL LVF  . CYSTOSCOPY W/ RETROGRADES  10/30/2011   Procedure: CYSTOSCOPY WITH RETROGRADE PYELOGRAM;  Surgeon: Claybon Jabs, MD;  Location[facility];  Service: Urology;  Laterality: Right;  . CYSTOSCOPY WITH LITHOLAPAXY  10/30/2011   Procedure: CYSTOSCOPY WITH LITHOLAPAXY;  Surgeon: Claybon Jabs, MD;  Location: [facility];  Service: Urology;  Laterality: N/A;  . EXTRACORPOREAL SHOCK WAVE LITHOTRIPSY  09-05-2010   RIGHT  . EYE SURGERY Right 08/30/2018  . NESBIT PROCEDURE  10-17-2004  CORRECTION OF PENILE ANGULATION (PEYRONIES DISEASE)    Social History   Socioeconomic History  . Marital status: Married    Spouse name: Hassan Rowan  . Number of children: 2  . Years of education: 37  . Highest education level: Some college, no degree  Occupational History  . Occupation: retired    Comment: self -employed farmer  Social Needs  . Financial resource strain: Not hard at all  . Food insecurity:    Worry: Never true    Inability: Never true  . Transportation needs:    Medical: No    Non-medical: No  Tobacco Use  . Smoking status: Former Smoker    Packs/day: 1.00    Years: 7.00    Pack years: 7.00    Types: Cigarettes    Last attempt to quit: 09/26/1973    Years since quitting: 45.2  . Smokeless tobacco: Current User    Types: Chew  . Tobacco comment: Chewing tobacco  Substance and Sexual Activity  . Alcohol use: No    Alcohol/week: 0.0 standard drinks  . Drug use: No  . Sexual activity: Not on file  Lifestyle  . Physical activity:    Days per week: 6 days    Minutes per session: 40 min  . Stress: Only a little  Relationships  . Social connections:    Talks on phone: More than three times a week    Gets together: More than three times a week    Attends religious service: More than 4 times per year    Active member of club or organization: No    Attends meetings of clubs or organizations:  Never    Relationship status: Married  Other Topics Concern  . Not on file  Social History Narrative  . Not on file     Family History  Problem Relation Age of Onset  . Stroke Mother   . Heart attack Mother   . Diabetes Mother   . Hypertension Mother   . Renal cancer Father   . Diabetes Sister   . Heart attack Brother   . Diabetes Son   . Heart attack Maternal Grandfather   . Heart attack Brother   . Bladder Cancer Brother       Labs/Other Tests and Data Reviewed:    Wt Readings from Last 3 Encounters:  09/02/18 164 lb (74.4 kg)  08/14/18 166 lb (75.3 kg)  08/07/18 166 lb (75.3 kg)   Temp Readings from Last 3 Encounters:  08/07/18 (!) 96.8 F (36 C) (Oral)  03/13/18 (!) 97.4 F (36.3 C) (Oral)  11/26/17 (!) 97.1 F (36.2 C) (Oral)   BP Readings from Last 3 Encounters:  09/02/18 118/65  08/14/18 108/70  08/07/18 124/71   Pulse Readings from Last 3 Encounters:  09/02/18 72  08/14/18 74  08/07/18 69     Lab Results  Component Value Date   HGBA1C 6.9 08/07/2018   HGBA1C 7.0 (H) 03/13/2018   HGBA1C 6.5 10/04/2017   Lab Results  Component Value Date   MICROALBUR negative 02/19/2015   LDLCALC 75 08/07/2018   CREATININE 1.11 08/07/2018       Chemistry      Component Value Date/Time   NA 141 08/07/2018 0945   K 4.3 08/07/2018 0945   CL 102 08/07/2018 0945   CO2 22 08/07/2018 0945   BUN 16 08/07/2018 0945   CREATININE 1.11 08/07/2018 0945   CREATININE 1.13 01/06/2013 1311      Component Value Date/Time  CALCIUM 9.8 08/07/2018 0945   ALKPHOS 72 08/07/2018 0945   AST 24 08/07/2018 0945   ALT 21 08/07/2018 0945   BILITOT 0.3 08/07/2018 0945         OBSERVATIONS/ OBJECTIVE:     Patient was alert and responded appropriately to all questions asked of him.  He says that his blood sugars fasting typically run no more than 143 and usually or less.  His weight is 157 and is up some and he knows he needs to work on getting this down through diet  and exercise.  He has not checked his blood pressure in a good while.  He is on B12 injections by his wife at home and this is midcycle and a good time to get his blood work.  He will talk with my nurse and make sure that he comes at a good time and will wear protective equipment for that.  Physical exam deferred due to nature of telephonic visit.  ASSESSMENT & PLAN    Time:   Today, I have spent 28 minutes with the patient via telephone discussing the above including Covid precautions.     Visit Diagnoses: 1. Elevated PSA -The patient will need rectal exam at next visit if he does not see the urologist anymore  2. Vitamin D deficiency -Continue vitamin D replacement pending results of lab work  3. Essential hypertension -Continue current treatment check blood pressures regularly at home and watch sodium intake  4. Vitamin B12 deficiency anemia due to intrinsic factor deficiency -Get B12 level as patient is still getting B12 injections at home by his wife  5. Aortic atherosclerosis ([place]) -Follow-up with cardiology and continue with lovastatin  6. Diabetes mellitus type 2 in nonobese (HCC) -Get blood work hemoglobin A1c and adjust medicines as needed  7. Hyperlipidemia associated with type 2 diabetes mellitus ([place]) -Continue with lovastatin and work with diet and exercise as much as possible  8. Multiple sclerosis ([place]) -Follow-up with neurology as planned  9. Thrombocytopenia (HCC) -No history of any bleeding issues by patient  10. Benign prostatic hyperplasia, unspecified whether lower urinary tract symptoms present -Rectal exam at next visit and PSA with blood work this visit     The above assessment and management plan was discussed with the patient. The patient verbalized understanding of and has agreed to the management plan. Patient is aware to call the clinic if symptoms persist or worsen. Patient is aware when to return to the clinic for a follow-up visit. Patient  educated on when it is appropriate to go to the emergency department.    Chipper Herb, MD [address], [place], [place] [address] Ph [phone]   Arrie Senate MD

## 2018-12-18 NOTE — Patient Instructions (Signed)
Continue to be careful and not put self at risk for falling Continue to work on weight blood sugar and cholesterol with diet and exercise to achieve weight loss Continue to drink plenty of water Follow-up regularly for eye exams Check blood sugars blood pressures and weights regularly Follow-up with cardiology and neurology We will try to call in a prescription for Vascepa and if your insurance says they will not pay for it continue with your current omega-3 fatty acid.

## 2018-12-18 NOTE — Addendum Note (Signed)
Addended by: Zannie Cove on: 12/18/2018 04:44 PM   Modules accepted: Orders

## 2018-12-25 ENCOUNTER — Other Ambulatory Visit: Payer: PPO

## 2018-12-25 ENCOUNTER — Other Ambulatory Visit: Payer: Self-pay

## 2018-12-25 DIAGNOSIS — G35 Multiple sclerosis: Secondary | ICD-10-CM

## 2018-12-25 DIAGNOSIS — I700 Atherosclerosis of aorta: Secondary | ICD-10-CM

## 2018-12-25 DIAGNOSIS — E559 Vitamin D deficiency, unspecified: Secondary | ICD-10-CM

## 2018-12-25 DIAGNOSIS — R972 Elevated prostate specific antigen [PSA]: Secondary | ICD-10-CM | POA: Diagnosis not present

## 2018-12-25 DIAGNOSIS — R3129 Other microscopic hematuria: Secondary | ICD-10-CM | POA: Diagnosis not present

## 2018-12-25 DIAGNOSIS — D696 Thrombocytopenia, unspecified: Secondary | ICD-10-CM

## 2018-12-25 DIAGNOSIS — I10 Essential (primary) hypertension: Secondary | ICD-10-CM | POA: Diagnosis not present

## 2018-12-25 DIAGNOSIS — E119 Type 2 diabetes mellitus without complications: Secondary | ICD-10-CM | POA: Diagnosis not present

## 2018-12-25 DIAGNOSIS — E1169 Type 2 diabetes mellitus with other specified complication: Secondary | ICD-10-CM | POA: Diagnosis not present

## 2018-12-25 DIAGNOSIS — E785 Hyperlipidemia, unspecified: Secondary | ICD-10-CM

## 2018-12-25 DIAGNOSIS — R5383 Other fatigue: Secondary | ICD-10-CM

## 2018-12-25 DIAGNOSIS — N400 Enlarged prostate without lower urinary tract symptoms: Secondary | ICD-10-CM | POA: Diagnosis not present

## 2018-12-25 DIAGNOSIS — D510 Vitamin B12 deficiency anemia due to intrinsic factor deficiency: Secondary | ICD-10-CM | POA: Diagnosis not present

## 2018-12-25 LAB — MICROSCOPIC EXAMINATION
Bacteria, UA: NONE SEEN
Renal Epithel, UA: NONE SEEN /hpf
WBC, UA: NONE SEEN /hpf (ref 0–5)

## 2018-12-25 LAB — URINALYSIS, COMPLETE
Bilirubin, UA: NEGATIVE
Glucose, UA: NEGATIVE
Ketones, UA: NEGATIVE
Leukocytes,UA: NEGATIVE
Nitrite, UA: NEGATIVE
RBC, UA: NEGATIVE
Specific Gravity, UA: 1.025 (ref 1.005–1.030)
Urobilinogen, Ur: 0.2 mg/dL (ref 0.2–1.0)
pH, UA: 5.5 (ref 5.0–7.5)

## 2018-12-25 LAB — BAYER DCA HB A1C WAIVED: HB A1C (BAYER DCA - WAIVED): 7.1 % — ABNORMAL HIGH (ref <7.0)

## 2018-12-26 LAB — LIPID PANEL
Chol/HDL Ratio: 3.8 ratio (ref 0.0–5.0)
Cholesterol, Total: 107 mg/dL (ref 100–199)
HDL: 28 mg/dL — ABNORMAL LOW (ref >39)
LDL Calculated: 66 mg/dL (ref 0–99)
Triglycerides: 64 mg/dL (ref 0–149)
VLDL Cholesterol Cal: 13 mg/dL (ref 5–40)

## 2018-12-26 LAB — BMP8+EGFR
BUN/Creatinine Ratio: 18 (ref 10–24)
BUN: 20 mg/dL (ref 8–27)
CO2: 20 mmol/L (ref 20–29)
Calcium: 9.6 mg/dL (ref 8.6–10.2)
Chloride: 104 mmol/L (ref 96–106)
Creatinine, Ser: 1.11 mg/dL (ref 0.76–1.27)
GFR calc Af Amer: 78 mL/min/{1.73_m2} (ref >59)
GFR calc non Af Amer: 68 mL/min/{1.73_m2} (ref >59)
Glucose: 134 mg/dL — ABNORMAL HIGH (ref 65–99)
Potassium: 4.1 mmol/L (ref 3.5–5.2)
Sodium: 140 mmol/L (ref 134–144)

## 2018-12-26 LAB — CBC WITH DIFFERENTIAL/PLATELET
Basophils Absolute: 0 10*3/uL (ref 0.0–0.2)
Basos: 1 %
EOS (ABSOLUTE): 0.1 10*3/uL (ref 0.0–0.4)
Eos: 1 %
Hematocrit: 33.7 % — ABNORMAL LOW (ref 37.5–51.0)
Hemoglobin: 11.4 g/dL — ABNORMAL LOW (ref 13.0–17.7)
Immature Grans (Abs): 0 10*3/uL (ref 0.0–0.1)
Immature Granulocytes: 0 %
Lymphocytes Absolute: 0.4 10*3/uL — ABNORMAL LOW (ref 0.7–3.1)
Lymphs: 9 %
MCH: 27.7 pg (ref 26.6–33.0)
MCHC: 33.8 g/dL (ref 31.5–35.7)
MCV: 82 fL (ref 79–97)
Monocytes Absolute: 0.5 10*3/uL (ref 0.1–0.9)
Monocytes: 13 %
Neutrophils Absolute: 3.3 10*3/uL (ref 1.4–7.0)
Neutrophils: 76 %
Platelets: 172 10*3/uL (ref 150–450)
RBC: 4.12 x10E6/uL — ABNORMAL LOW (ref 4.14–5.80)
RDW: 14.5 % (ref 11.6–15.4)
WBC: 4.3 10*3/uL (ref 3.4–10.8)

## 2018-12-26 LAB — VITAMIN B12: Vitamin B-12: 606 pg/mL (ref 232–1245)

## 2018-12-26 LAB — THYROID PANEL WITH TSH
Free Thyroxine Index: 2.5 (ref 1.2–4.9)
T3 Uptake Ratio: 27 % (ref 24–39)
T4, Total: 9.2 ug/dL (ref 4.5–12.0)
TSH: 3.79 u[IU]/mL (ref 0.450–4.500)

## 2018-12-26 LAB — HEPATIC FUNCTION PANEL
ALT: 26 IU/L (ref 0–44)
AST: 34 IU/L (ref 0–40)
Albumin: 4.6 g/dL (ref 3.8–4.8)
Alkaline Phosphatase: 70 IU/L (ref 39–117)
Bilirubin Total: 0.4 mg/dL (ref 0.0–1.2)
Bilirubin, Direct: 0.14 mg/dL (ref 0.00–0.40)
Total Protein: 7 g/dL (ref 6.0–8.5)

## 2018-12-26 LAB — PSA, TOTAL AND FREE
PSA, Free Pct: 22 %
PSA, Free: 1.12 ng/mL
Prostate Specific Ag, Serum: 5.1 ng/mL — ABNORMAL HIGH (ref 0.0–4.0)

## 2018-12-26 LAB — VITAMIN D 25 HYDROXY (VIT D DEFICIENCY, FRACTURES): Vit D, 25-Hydroxy: 47.3 ng/mL (ref 30.0–100.0)

## 2018-12-30 ENCOUNTER — Other Ambulatory Visit: Payer: Self-pay | Admitting: *Deleted

## 2018-12-30 MED ORDER — ROSUVASTATIN CALCIUM 20 MG PO TABS: 20.0000 mg | ORAL_TABLET | Freq: Every day | ORAL | 1 refills | Status: DC

## 2019-02-27 NOTE — Progress Notes (Signed)
PATIENT: Jacob Fowler DOB: 01-13-1951  REASON FOR VISIT: follow up HISTORY FROM: patient  Chief Complaint  Patient presents with   Follow-up    6 mon f/u. Wife present. Rm 1. No new concerns at this time.      HISTORY OF PRESENT ILLNESS: Today 03/03/19 Jacob Fowler is a 68 y.o. male here today for follow up of MS. He continues Betaseron 0.65m every other day. He has had two episodes where he feels that his right side "freezes up." He describes event as a short period of time where he can not use his right arm or leg. Episode lasts about an hour and then resolves. He denies any cognitive changes during event. No speech changes, confusion or lapse in memory. No numbness or tingling. No vision changes.   Gait is about the same. He has had a couple of falls the past 6 months. His wife states that he seems to always fall backwards. He denies dizziness or lightheadedness with fall. He states that gait and energy wax and wane. 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 Some days he is much weaker than others. He does have to pick up his right leg when walking. He continues to wear AFO brace.   He has stiffness of his neck about once a month. Tylenol helps with this.   Desmopressin and tamsulosin help with bladder funstion. He feels that urinary frequency is stable and unchanged from last visit.    HISTORY: (copied from Dr SGarth Bignessnote on 09/02/2018)  Jacob Mccamishis a 68year old man with multiple sclerosis.      Update 09/02/2018: He feels walking has mildly worsened and is slower.    He uses a cane outside and a walker inside his home.  He stumbles but no recent fall.   He uses an AFO.     He has more difficulty with some of the chores.     The right leg is weak and spastic.     Bladder is doing well with the tamsulosin and desmopressin.   Vision is unchanged.   Fatigue is doing well and he sleeps well most nights.      He denies depression or cognitive issues.    He is on Betaseron and tolerates it well.    He denies recent exacerbation.     He is not interested in other medications.     Update 02/25/2018: He feels gait is doing worse with more stumbles and some minor falls.    No injuries.  He uses a walker at night in the house and the cane the rest of the day.   Using a walker is difficult on the ground at his house.  He does a lot of chores.  He takes care of some animals. Right leg is weak and spastic.    He feels the arm is doing well.      He feels bladder is doing well on tamsulosin and desmopressin  He notes fatigue every afternoons, worse when there is more heat.    He has insomnia twice a week with more trouble falling asleep.   Occasionally, he takes greater than one hour to sleep.    He feels mood is fine.      Cognition is doing ok.   He is on Betaseron and tolerates it well with no significant skin reactions and tolerable systemic reactions.   We have discussed other medications on several occasions (  pills and IVs) he would like to stay on Betaseron.  Update 08/22/2017:    He feels he is doing fairly well. He continues on Betaseron and tolerates it well. His last CBC did show that the lymphocytes were reduced at 0.5. It was done 05/17/2017. He also notes that gait is a little worse. 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 He has had a couple falls. He uses an AFO on the right leg. Ampyra had not helped him in the past. Has more issues with his bladder. He is emptying better with the tamsulosin but he still has a lot of of urgency. He has nocturia about 5 times a night. Vision is fine.  He notes some fatigue but this is no worse than typical. He has no problems with mood or change in cognition.   From 02/19/2017: He denies any major changes though does feel his gait has worsened.   Since the last visit he has had his MRIs and blood work.   MRI's 09/04/16 were unchanged form 2016.   He has a plaque to the left  at C4-C5 and to the right at T1, unchanged. He also has plaques in the brain consistent with MS.   Two are located in the brainstem.  CBC/LFTs were fine 01/02/17.   MS   He feels stable.  He is on Betaseron and he tolerates it well. We have discussed ocrelizumab as it can work for relapsing remitting as well as primary progressive MS which appears to be more consistent with his disease course  Gait/strength/sensation: He has poor gait due to right >> left leg weakness and spasticity.   He has a right AFO and it helps his foot drop some. With the cane, he can walk long distances. He does not like the walker.   He falls 1-2 times a month.  He loses balance if he takes some steps backwards.     He denies any numbness in the legs     He denies any significant numbness, weakness or clumsiness in the arms.   He he notes some spasticity in the right leg but he denies denies painful  spasms.     He tried Ampyra in the past but it has not helped.     Bladder: He has urinary frequency,  urgency and urinary hesitancy. Tamsulosin helps.  Oxybutynin did not help much and Myrbetriq was expensive  Now he needs to urinate q 2-3 hours during the day and 0 to 1 at night.  He has not had any incontinence.     Vision: He denies any MS related visual problems. He wears glasses for correction.  Fatigue/sleep: He is active and gets outside every day. 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   His fatigue is more physical and cognitive and is present as the day goes on and with heat  Ampyra did not help. Amantadine did not help   He sually starts every day feeling well rested.    He is sleeping well most nights.   He does not snore much and his wife has not noted any apnea or gasping.   Mood/cognition:   He denies depression or anxiety. He feels his short-term memory is mildly worse than last year but notes no other cognitive issue.   MS History:   He began to note  significant difficulty with gait in January 2010.      He  had some back x-rays. He was then referred to neurosurgery that noted that he had plaques in his spine c/w  MS. He had further testing including MRI of the brain and spinal tap. These were all consistent with multiple sclerosis and by November 2010 he had started Betaseron. He feels that he has been mostly stable over the past 5 years but has noted that his gait is a little worse than it was in 2010. About 2013, he started using a cane.  MRI data (Images personally reviewed at previous visit) MRI Images  09/04/16 were personally reviewed and compared.  I concur with the interpretations: IMPRESSION: This MRI of the cervical spine with and without contrast shows the following: 1. T2 hyperintense lesions within the spinal cord to the left adjacent to C4-C5 and to the right adjacent to T1, unchanged in appearance when compared to the previous MRI.  2. Mild degenerative changes at C3-C4, C4-C5 and C6-C7 that did not lead to any nerve root compression. 3. There is a normal enhancement pattern and there are no acute findings.   IMPRESSION: This MRI of the brain with and without contrast shows the following: 1. Multiple T2/FLAIR hyperintense foci in the periventricular, juxtacortical and deep white matter of the hemispheres and 2 foci in the brainstem in a pattern and configuration consistent with chronic demyelinating plaque associated with multiple sclerosis. None of the foci appears to be acute. When compared to the MRI dated 09/28/2014, there is no interval change. 2. Mild to moderate cortical atrophy that has mildly progressed when compared to the previous MRI. 3. There are no acute findings and there is a normal enhancement pattern.    REVIEW OF SYSTEMS: Out of a complete 14 system review of symptoms, the patient complains only of the following symptoms, weakness, impaired gait and all other reviewed systems are  negative.   ALLERGIES: No Known Allergies  HOME MEDICATIONS: Outpatient Medications Prior to Visit  Medication Sig Dispense Refill   amLODipine (NORVASC) 2.5 MG tablet Take 1 tablet (2.5 mg total) by mouth every morning. 90 tablet 3   aspirin (LONGS ADULT LOW STRENGTH ASA) 81 MG EC tablet Take 81 mg by mouth daily.      BETASERON 0.3 MG KIT injection Inject 0.25 mg into the skin every other day. 1 kit 11   blood glucose meter kit and supplies Dispense based on patient and insurance preference. Check BS TID and PRN  (FOR ICD dx E11.9). 1 each 1   cholecalciferol (VITAMIN D) 1000 UNITS tablet Take 1,000 Units by mouth daily.     cyanocobalamin (,VITAMIN B-12,) 1000 MCG/ML injection INJECT 1ML INTO THE SKIN ONCE MONTH 3 mL 3   desmopressin (DDAVP) 0.1 MG tablet Take 1 tablet (0.1 mg total) by mouth daily. 90 tablet 3   doxycycline (VIBRA-TABS) 100 MG tablet Take 1 tablet (100 mg total) by mouth daily. (Patient taking differently: Take 100 mg by mouth every other day. ) 90 tablet 3   fish oil-omega-3 fatty acids 1000 MG capsule Take 2 g by mouth 2 (two) times daily.     glucose blood test strip Use as instructed 100 each 12   Icosapent Ethyl (VASCEPA) 1 g CAPS Take 2 capsules (2 g total) by mouth 2 (two) times daily. 120 capsule 3   isosorbide mononitrate (IMDUR) 60 MG 24 hr tablet Take 0.5 tablets (30 mg total) by mouth daily. 45 tablet 3   levothyroxine (SYNTHROID) 50 MCG tablet TAKE ONE TABLET BY MOUTH DAILY BEFORE  BREAKFAST 90 tablet 3   lisinopril (ZESTRIL) 10 MG tablet Take 1 tablet (10 mg total) by mouth daily. 90 tablet 3   metFORMIN (GLUCOPHAGE) 500 MG tablet TAKE TWO TABLETS BY MOUTH TWICE DAILY WITH MEALS. 120 tablet 3   omeprazole (PRILOSEC) 20 MG capsule Take 1 capsule (20 mg total) by mouth daily. 90 capsule 3   pioglitazone (ACTOS) 30 MG tablet Take 1 tablet (30 mg total) by mouth daily. 90 tablet 3   rosuvastatin (CRESTOR) 20 MG tablet Take 1 tablet (20 mg  total) by mouth daily. 90 tablet 1   sildenafil (VIAGRA) 100 MG tablet Take 1 tablet (100 mg total) by mouth daily as needed for erectile dysfunction. 12 tablet 2   SYRINGE-NEEDLE, DISP, 3 ML (B-D SYRINGE/NEEDLE 3CC/25GX5/8) 25G X 5/8" 3 ML MISC Use to give once a month B12 injections 12 each 0   tamsulosin (FLOMAX) 0.4 MG CAPS capsule Take 1 capsule (0.4 mg total) by mouth daily after supper. 90 capsule 3   Facility-Administered Medications Prior to Visit  Medication Dose Route Frequency Provider Last Rate Last Dose   cyanocobalamin ((VITAMIN B-12)) injection 1,000 mcg  1,000 mcg Intramuscular Q30 days Chipper Herb, MD   1,000 mcg at 03/02/15 1647    PAST MEDICAL HISTORY: Past Medical History:  Diagnosis Date   Acquired leg length discrepancy    Bladder stone    BPH associated with nocturia    CAD (coronary artery disease) CARDIOLOGIST- DR HOCHREIN--  LAST VISIT 09-27-2011 IN EPIC   Non obstructive Cath 2010-  HX CORONARY SPASM 2004   Cataract    bilateral    GERD (gastroesophageal reflux disease)    H/O hiatal hernia    History of nephrolithiasis 2009   Hx of adenomatous colonic polyps    Hyperlipidemia    Hypertension    Movement disorder    Multiple sclerosis ([place]) DX  10/10---  NEUROLOGIST  DR Delfino Lovett [facility] ([place])   RIGHT SIDE AFFECTED MORE W/ WEAKNESS- - USES CANE   Neuromuscular disorder ([facility])    MS   NIDDM (non-insulin dependent diabetes mellitus)    Peyronie's disease    RBBB    Renal stone RIGHT   Right ureteral stone    Rosacea    Vision abnormalities     PAST SURGICAL HISTORY: 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 / NORMAL LVF   CYSTOSCOPY W/ RETROGRADES  10/30/2011   Procedure: CYSTOSCOPY WITH RETROGRADE PYELOGRAM;  Surgeon: Claybon Jabs, MD;  Location: [facility];  Service: Urology;   Laterality: Right;   CYSTOSCOPY WITH LITHOLAPAXY  10/30/2011   Procedure: CYSTOSCOPY WITH LITHOLAPAXY;  Surgeon: Claybon Jabs, MD;  Location: [facility];  Service: Urology;  Laterality: N/A;   EXTRACORPOREAL SHOCK WAVE LITHOTRIPSY  09-05-2010   RIGHT   EYE SURGERY Right 08/30/2018   NESBIT PROCEDURE  10-17-2004   CORRECTION OF PENILE ANGULATION (PEYRONIES DISEASE)    FAMILY HISTORY: Family History  Problem Relation Age of Onset   Stroke Mother    Heart attack Mother    Diabetes Mother    Hypertension Mother    Renal cancer Father    Diabetes Sister    Heart attack Brother    Diabetes Son    Heart attack Maternal Grandfather    Heart attack Brother    Bladder Cancer Brother  SOCIAL HISTORY: Social History   Socioeconomic History   Marital status: Married    Spouse name: Hassan Rowan   Number of children: 2   Years of education: 13   Highest education level: Some college, no degree  Occupational History   Occupation: retired    Comment: self -employed farmer  Scientist, product/process development strain: Not hard at all   Food insecurity    Worry: Never true    Inability: Never true   Transportation needs    Medical: No    Non-medical: No  Tobacco Use   Smoking status: Former Smoker    Packs/day: 1.00    Years: 7.00    Pack years: 7.00    Types: Cigarettes    Quit date: 09/26/1973    Years since quitting: 45.4   Smokeless tobacco: Current User    Types: Chew   Tobacco comment: Chewing tobacco  Substance and Sexual Activity   Alcohol use: No    Alcohol/week: 0.0 standard drinks   Drug use: No   Sexual activity: Not on file  Lifestyle   Physical activity    Days per week: 6 days    Minutes per session: 40 min   Stress: Only a little  Relationships   Social connections    Talks on phone: More than three times a week    Gets together: More than three times a week    Attends religious service: More than 4 times  per year    Active member of club or organization: No    Attends meetings of clubs or organizations: Never    Relationship status: Married   Intimate partner violence    Fear of current or ex partner: No    Emotionally abused: No    Physically abused: No    Forced sexual activity: No  Other Topics Concern   Not on file  Social History Narrative   Not on file      PHYSICAL EXAM  Vitals:   03/03/19 0808  BP: 114/67  Pulse: 66  Temp: (!) 97.1 F (36.2 C)  TempSrc: Oral  Weight: 162 lb 6.4 oz (73.7 kg)  Height: _0  (1.676 m)   Body mass index is 26.21 kg/m.  Generalized: Well developed, in no acute distress  Cardiology: normal rate and rhythm, no murmur noted Neurological examination  Mentation: Alert oriented to time, place, history taking. Follows all commands speech and language fluent Cranial nerve II-XII: Pupils were equal round reactive to light. Extraocular movements were full, visual field were full on confrontational test. Facial sensation and strength were normal. Uvula tongue midline. Head turning and shoulder shrug  were normal and symmetric. Motor: The motor testing reveals 5 over 5 strength of bilateral upper extremities. 3/5 of left lower hip flexion, 4/5 with extension, 2/5 of right lower extremity hip flexion and 3/5 extension, AFO brace in place right foot, Good symmetric motor tone is noted throughout.  Sensory: Sensory testing is intact to soft touch on all 4 extremities. No evidence of extinction is noted.  Coordination: Cerebellar testing reveals good finger-nose-finger and heel-to-shin bilaterally.  Gait and station: patient in [place] today and does not feel strong enough to walk  Reflexes: Deep tendon reflexes are symmetric and normal bilaterally.   DIAGNOSTIC DATA (LABS, IMAGING, TESTING) - I reviewed patient records, labs, notes, testing and imaging myself where available.  MMSE - Mini Mental State Exam 07/05/2017  Orientation to time 5   Orientation to Place 5  Registration  3  Attention/ Calculation 5  Recall 2  Language- name 2 objects 2  Language- repeat 1  Language- follow 3 step command 3  Language- read & follow direction 1  Write a sentence 1  Copy design 1  Total score 29     Lab Results  Component Value Date   WBC 4.3 12/25/2018   HGB 11.4 (L) 12/25/2018   HCT 33.7 (L) 12/25/2018   MCV 82 12/25/2018   PLT 172 12/25/2018      Component Value Date/Time   NA 140 12/25/2018 0831   K 4.1 12/25/2018 0831   CL 104 12/25/2018 0831   CO2 20 12/25/2018 0831   GLUCOSE 134 (H) 12/25/2018 0831   GLUCOSE 130 (H) 01/06/2013 1311   BUN 20 12/25/2018 0831   CREATININE 1.11 12/25/2018 0831   CREATININE 1.13 01/06/2013 1311   CALCIUM 9.6 12/25/2018 0831   PROT 7.0 12/25/2018 0831   ALBUMIN 4.6 12/25/2018 0831   AST 34 12/25/2018 0831   ALT 26 12/25/2018 0831   ALKPHOS 70 12/25/2018 0831   BILITOT 0.4 12/25/2018 0831   GFRNONAA 68 12/25/2018 0831   GFRNONAA 69 01/06/2013 1311   GFRAA 78 12/25/2018 0831   GFRAA 80 01/06/2013 1311   Lab Results  Component Value Date   CHOL 107 12/25/2018   HDL 28 (L) 12/25/2018   LDLCALC 66 12/25/2018   TRIG 64 12/25/2018   CHOLHDL 3.8 12/25/2018   Lab Results  Component Value Date   HGBA1C 7.1 (H) 12/25/2018   Lab Results  Component Value Date   VITAMINB12 606 12/25/2018   Lab Results  Component Value Date   TSH 3.790 12/25/2018    ASSESSMENT AND PLAN 68 y.o. year old male  has a past medical history of Acquired leg length discrepancy, Bladder stone, BPH associated with nocturia, CAD (coronary artery disease) (CARDIOLOGIST- DR HOCHREIN--  LAST VISIT 09-27-2011 IN EPIC), Cataract, GERD (gastroesophageal reflux disease), H/O hiatal hernia, History of nephrolithiasis (2009), adenomatous colonic polyps, Hyperlipidemia, Hypertension, Movement disorder, Multiple sclerosis ([place]) (DX  10/10---  NEUROLOGIST  DR Delfino Lovett [facility] ([place] Neuromuscular disorder ([place]),  NIDDM (non-insulin dependent diabetes mellitus), Peyronie's disease, RBBB, Renal stone (RIGHT), Right ureteral stone, Rosacea, and Vision abnormalities. here with     ICD-10-CM   1. Multiple sclerosis ([place])  G35 MR BRAIN W WO CONTRAST    MR CERVICAL SPINE W WO CONTRAST  2. Multiple falls  R29.6 MR BRAIN W WO CONTRAST  3. Impaired gait and mobility  R26.89 MR BRAIN W WO CONTRAST    MR CERVICAL SPINE W WO CONTRAST       Orders Placed This Encounter  Procedures   MR BRAIN W WO CONTRAST    Standing Status:   Future    Standing Expiration Date:   05/02/2020    Order Specific Question:   ** REASON FOR EXAM (FREE TEXT)    Answer:   MS, impaired gait, multiple falls    Order Specific Question:   If indicated for the ordered procedure, I authorize the administration of contrast media per Radiology protocol    Answer:   Yes    Order Specific Question:   What is the patient's sedation requirement?    Answer:   No Sedation    Order Specific Question:   Does the patient have a pacemaker or implanted devices?    Answer:   No    Order Specific Question:   Radiology Contrast Protocol - do NOT remove file path  Answer:   [email or web address]    Order Specific Question:   Preferred imaging location?    Answer:   Internal   MR CERVICAL SPINE W WO CONTRAST    MS protocol    Standing Status:   Future    Standing Expiration Date:   05/02/2020    Order Specific Question:   ** REASON FOR EXAM (FREE TEXT)    Answer:   MS, impaired gait, multiple falls    Order Specific Question:   If indicated for the ordered procedure, I authorize the administration of contrast media per Radiology protocol    Answer:   Yes    Order Specific Question:   What is the patient's sedation requirement?    Answer:   No Sedation    Order Specific Question:   Does the patient have a pacemaker or implanted devices?    Answer:   No    Order Specific Question:   Radiology Contrast Protocol - do NOT  remove file path    Answer:   [email or web address]    Order Specific Question:   Preferred imaging location?    Answer:   Internal     Jacob Fowler feels that overall he is stable today. He has good days and bad days. He has noted two episodes where he is unable to use the right side of his body. No other stroke like symptoms. No cognitive or speech changes. We will update MRI Brain and Cervical Spine (last MRI in 08/2016). We have discussed s/s of stroke and when to seek emergency medical attention. He has also noted more falls since last visit. We have discussed PT for eval and treatment but he is hesitant at this time. He will discuss this option with his wife. I have given fall precautions and advised to use cane and Rolator consistently. He has failed Ampyra and amanitine in the past. He will continue Betaseron as prescribed. He is not interested in other medication options at this time. We will follow up pending MRI results. He and his wife verbalize understanding and agreement with this plan.   No orders of the defined types were placed in this encounter.     I spent 30 minutes with the patient. 50% of this time was spent counseling and educating patient on plan of care and medications.     Debbora Presto, FNP-C 03/03/2019, 11:07 AM [facility] [address], [place], [place] [address] [phone]

## 2019-03-03 ENCOUNTER — Encounter: Payer: Self-pay | Admitting: Family Medicine

## 2019-03-03 ENCOUNTER — Ambulatory Visit (INDEPENDENT_AMBULATORY_CARE_PROVIDER_SITE_OTHER): Payer: PPO | Admitting: Family Medicine

## 2019-03-03 ENCOUNTER — Other Ambulatory Visit: Payer: Self-pay

## 2019-03-03 ENCOUNTER — Telehealth: Payer: Self-pay | Admitting: Family Medicine

## 2019-03-03 VITALS — BP 114/67 | HR 66 | Temp 97.1°F | Ht 66.0 in | Wt 162.4 lb

## 2019-03-03 DIAGNOSIS — R2689 Other abnormalities of gait and mobility: Secondary | ICD-10-CM

## 2019-03-03 DIAGNOSIS — G35 Multiple sclerosis: Secondary | ICD-10-CM

## 2019-03-03 DIAGNOSIS — R296 Repeated falls: Secondary | ICD-10-CM | POA: Diagnosis not present

## 2019-03-03 NOTE — Telephone Encounter (Signed)
Health team order sent to GI. No auth they will reach out to the patient to schedule.  

## 2019-03-03 NOTE — Patient Instructions (Addendum)
MRI brain and cervical spine  We will continue current therapy  Consider PT  Fall precautions  Stroke precautions  Follow up with Dr Felecia Shelling in 3-6 months, call sooner if needed   Stroke Prevention Some medical conditions and lifestyle choices can lead to a higher risk for a stroke. You can help to prevent a stroke by making nutrition, lifestyle, and other changes. What nutrition changes can be made?   Eat healthy foods. ? Choose foods that are high in fiber. These include:  Fresh fruits.  Fresh vegetables.  Whole grains. ? Eat at least 5 or more servings of fruits and vegetables each day. Try to fill half of your plate at each meal with fruits and vegetables. ? Choose lean protein foods. These include:  Lowfat (lean) cuts of meat.  Chicken without skin.  Fish.  Tofu.  Beans.  Nuts. ? Eat low-fat dairy products. ? Avoid foods that:  Are high in salt (sodium).  Have saturated fat.  Have trans fat.  Have cholesterol.  Are processed.  Are premade.  Follow eating guidelines as told by your doctor. These may include: ? Reducing how many calories you eat and drink each day. ? Limiting how much salt you eat or drink each day to 1,500 milligrams (mg). ? Using only healthy fats for cooking. These include:  Olive oil.  Canola oil.  Sunflower oil. ? Counting how many carbohydrates you eat and drink each day. What lifestyle changes can be made?  Try to stay at a healthy weight. Talk to your doctor about what a good weight is for you.  Get at least 30 minutes of moderate physical activity at least 5 days a week. This can include: ? Fast walking. ? Biking. ? Swimming.  Do not use any products that have nicotine or tobacco. This includes cigarettes and e-cigarettes. If you need help quitting, ask your doctor. Avoid being around tobacco smoke in general.  Limit how much alcohol you drink to no more than 1 drink a day for nonpregnant women and 2 drinks a  day for men. One drink equals 12 oz of beer, 5 oz of wine, or 1 oz of hard liquor.  Do not use drugs.  Avoid taking birth control pills. Talk to your doctor about the risks of taking birth control pills if: ? You are over 71 years old. ? You smoke. ? You get migraines. ? You have had a blood clot. What other changes can be made?  Manage your cholesterol. ? It is important to eat a healthy diet. ? If your cholesterol cannot be managed through your diet, you may also need to take medicines. Take medicines as told by your doctor.  Manage your diabetes. ? It is important to eat a healthy diet and to exercise regularly. ? If your blood sugar cannot be managed through diet and exercise, you may need to take medicines. Take medicines as told by your doctor.  Control your high blood pressure (hypertension). ? Try to keep your blood pressure below 130/80. This can help lower your risk of stroke. ? It is important to eat a healthy diet and to exercise regularly. ? If your blood pressure cannot be managed through diet and exercise, you may need to take medicines. Take medicines as told by your doctor. ? Ask your doctor if you should check your blood pressure at home. ? Have your blood pressure checked every year. Do this even if your blood pressure is normal.  Talk to  your doctor about getting checked for a sleep disorder. Signs of this can include: ? Snoring a lot. ? Feeling very tired.  Take over-the-counter and prescription medicines only as told by your doctor. These may include aspirin or blood thinners (antiplatelets or anticoagulants).  Make sure that any other medical conditions you have are managed. Where to find more information  American Stroke Association: www.strokeassociation.org  National Stroke Association: www.stroke.org Get help right away if:  You have any symptoms of stroke. "BE FAST" is an easy way to remember the main warning signs: ? B - Balance. Signs are  dizziness, sudden trouble walking, or loss of balance. ? E - Eyes. Signs are trouble seeing or a sudden change in how you see. ? F - Face. Signs are sudden weakness or loss of feeling of the face, or the face or eyelid drooping on one side. ? A - Arms. Signs are weakness or loss of feeling in an arm. This happens suddenly and usually on one side of the body. ? S - Speech. Signs are sudden trouble speaking, slurred speech, or trouble understanding what people say. ? T - Time. Time to call emergency services. Write down what time symptoms started.  You have other signs of stroke, such as: ? A sudden, very bad headache with no known cause. ? Feeling sick to your stomach (nausea). ? Throwing up (vomiting). ? Jerky movements you cannot control (seizure). These symptoms may represent a serious problem that is an emergency. Do not wait to see if the symptoms will go away. Get medical help right away. Call your local emergency services (911 in the [place] Do not drive yourself to the hospital. Summary  You can prevent a stroke by eating healthy, exercising, not smoking, drinking less alcohol, and treating other health problems, such as diabetes, high blood pressure, or high cholesterol.  Do not use any products that contain nicotine or tobacco, such as cigarettes and e-cigarettes.  Get help right away if you have any signs or symptoms of a stroke. This information is not intended to replace advice given to you by your health care provider. Make sure you discuss any questions you have with your health care provider. Document Released: 01/02/2012 Document Revised: 08/29/2018 Document Reviewed: 10/04/2016 Elsevier Patient Education  2020 [employer]. Multiple Sclerosis Multiple sclerosis (MS) is a disease of the brain, spinal cord, and optic nerves (central nervous system). It causes the body's disease-fighting (immune) system to destroy the protective covering (myelin sheath) around nerves in the  brain. When this happens, signals (nerve impulses) going to and from the brain and spinal cord do not get sent properly or may not get sent at all. There are several types of MS:  Relapsing-remitting MS. This is the most common type. This causes sudden attacks of symptoms. After an attack, you may recover completely until the next attack, or some symptoms may remain permanently.  Secondary progressive MS. This usually develops after the onset of relapsing-remitting MS. Similar to relapsing-remitting MS, this type also causes sudden attacks of symptoms. Attacks may be less frequent, but symptoms slowly get worse (progress) over time.  Primary progressive MS. This causes symptoms that steadily progress over time. This type of MS does not cause sudden attacks of symptoms. The age of onset of MS varies, but it often develops between 64-26 years of age. MS is a lifelong (chronic) condition. There is no cure, but treatment can help slow down the progression of the disease. What are the causes? The  cause of this condition is not known. What increases the risk? You are more likely to develop this condition if:  You are a woman.  You have a relative with MS. However, the condition is not passed from parent to child (inherited).  You have a lack (deficiency) of vitamin D.  You smoke. MS is more common in the [place] than in the [place]. What are the signs or symptoms? Relapsing-remitting and secondary progressive MS cause symptoms to occur in episodes or attacks that may last weeks to months. There may be long periods between attacks in which there are almost no symptoms. Primary progressive MS causes symptoms to steadily progress after they develop. Symptoms of MS vary because of the many different ways it affects the central nervous system. The main symptoms include:  Vision problems and eye pain.  Numbness.  Weakness.  Inability to move your arms, hands, feet,  or legs (paralysis).  Balance problems.  Shaking that you cannot control (tremors).  Muscle spasms.  Problems with thinking (cognitive changes). MS can also cause symptoms that are associated with the disease, but are not always the direct result of an MS attack. They may include:  Inability to control urination or bowel movements (incontinence).  Headaches.  Fatigue.  Inability to tolerate heat.  Emotional changes.  Depression.  Pain. How is this diagnosed? This condition is diagnosed based on:  Your symptoms.  A neurological exam. This involves checking central nervous system function, such as nerve function, reflexes, and coordination.  MRIs of the brain and spinal cord.  Lab tests, including a lumbar puncture that tests the fluid that surrounds the brain and spinal cord (cerebrospinal fluid).  Tests to measure the electrical activity of the brain in response to stimulation (evoked potentials). How is this treated? There is no cure for MS, but medicines can help decrease the number and frequency of attacks and help relieve nuisance symptoms. Treatment options may include:  Medicines that reduce the frequency of attacks. These medicines may be given by injection, by mouth (orally), or through an IV.  Medicines that reduce inflammation (steroids). These may provide short-term relief of symptoms.  Medicines to help control pain, depression, fatigue, or incontinence.  Vitamin D, if you have a deficiency.  Using devices to help you move around (assistive devices), such as braces, a cane, or a walker.  Physical therapy to strengthen and stretch your muscles.  Occupational therapy to help you with everyday tasks.  Alternative or complementary treatments such as exercise, massage, or acupuncture. Follow these instructions at home:  Take over-the-counter and prescription medicines only as told by your health care provider.  Do not drive or use heavy machinery while  taking prescription pain medicine.  Use assistive devices as recommended by your physical therapist or your health care provider.  Exercise as directed by your health care provider.  Return to your normal activities as told by your health care provider. Ask your health care provider what activities are safe for you.  Reach out for support. Share your feelings with friends, family, or a support group.  Keep all follow-up visits as told by your health care provider and therapists. This is important. Where to find more information  National Multiple Sclerosis Society: https://www.nationalmssociety.org Contact a health care provider if:  You feel depressed.  You develop new pain or numbness.  You have tremors.  You have problems with sexual function. Get help right away if:  You develop paralysis.  You develop  numbness.  You have problems with your bladder or bowel function.  You develop double vision.  You lose vision in one or both eyes.  You develop suicidal thoughts.  You develop severe confusion. If you ever feel like you may hurt yourself or others, or have thoughts about taking your own life, get help right away. You can go to your nearest emergency department or call:  Your local emergency services (911 in the [place]  A suicide crisis helpline, such as the [facility] at 4453736607. This is open 24 hours a day. Summary  Multiple sclerosis (MS) is a disease of the central nervous system that causes the body's immune system to destroy the protective covering (myelin sheath) around nerves in the brain.  There are 3 types of MS: relapsing-remitting, secondary progressive, and primary progressive. Relapsing-remitting and secondary progressive MS cause symptoms to occur in episodes or attacks that may last weeks to months. Primary progressive MS causes symptoms to steadily progress after they develop.  There is no cure for MS, but medicines  can help decrease the number and frequency of attacks and help relieve nuisance symptoms. Treatment may also include physical or occupational therapy.  If you develop numbness, paralysis, vision problems, or other neurological symptoms, get help right away. This information is not intended to replace advice given to you by your health care provider. Make sure you discuss any questions you have with your health care provider. Document Released: 06/30/2000 Document Revised: 06/15/2017 Document Reviewed: 09/11/2016 Elsevier Patient Education  2020 [employer].

## 2019-03-03 NOTE — Progress Notes (Signed)
I have read the note, and I agree with the clinical assessment and plan.  Richard A. Sater, MD, PhD, FAAN Certified in Neurology, Clinical Neurophysiology, Sleep Medicine, Pain Medicine and Neuroimaging  [facility] [address], [address] , [place] [address] [phone]  

## 2019-03-06 ENCOUNTER — Telehealth: Payer: Self-pay | Admitting: *Deleted

## 2019-03-06 NOTE — Telephone Encounter (Signed)
Received disability forms for pt fro [employer] life insurance to be completed.

## 2019-03-06 NOTE — Telephone Encounter (Signed)
Filled out.  To AL/NP to complete and sign.

## 2019-03-10 NOTE — Telephone Encounter (Signed)
Returned to [place]

## 2019-03-10 NOTE — Telephone Encounter (Signed)
Form given to MR for processing.

## 2019-03-20 NOTE — Telephone Encounter (Signed)
addended form faxed to confirmation received to [employer] [phone].  To MR.

## 2019-03-31 ENCOUNTER — Ambulatory Visit
Admission: RE | Admit: 2019-03-31 | Discharge: 2019-03-31 | Disposition: A | Discharge status: Home / Self Care | Payer: PPO | Source: Ambulatory Visit | Attending: Family Medicine | Admitting: Family Medicine

## 2019-03-31 ENCOUNTER — Other Ambulatory Visit: Payer: Self-pay

## 2019-03-31 DIAGNOSIS — R2689 Other abnormalities of gait and mobility: Secondary | ICD-10-CM

## 2019-03-31 DIAGNOSIS — G35 Multiple sclerosis: Secondary | ICD-10-CM

## 2019-03-31 DIAGNOSIS — R296 Repeated falls: Secondary | ICD-10-CM

## 2019-03-31 MED ORDER — GADOBENATE DIMEGLUMINE 529 MG/ML IV SOLN
15.0000 mL | Freq: Once | INTRAVENOUS | Status: AC | PRN
  Administered 2019-03-31: 15 mL via INTRAVENOUS

## 2019-04-03 ENCOUNTER — Telehealth: Payer: Self-pay

## 2019-04-03 NOTE — Telephone Encounter (Signed)
-----   Message from Debbora Presto, NP sent at 04/01/2019 10:56 AM EDT ----- MRI is stable with no acute lesions.

## 2019-04-03 NOTE — Telephone Encounter (Signed)
Patient verbalized understanding his results. No questions or concerns at this time.

## 2019-04-15 ENCOUNTER — Telehealth: Payer: Self-pay | Admitting: General Practice

## 2019-04-15 NOTE — Telephone Encounter (Signed)
Pt appt made

## 2019-04-24 ENCOUNTER — Other Ambulatory Visit: Payer: Self-pay

## 2019-04-25 ENCOUNTER — Other Ambulatory Visit: Payer: Self-pay

## 2019-04-25 ENCOUNTER — Encounter: Payer: Self-pay | Admitting: Family Medicine

## 2019-04-25 ENCOUNTER — Ambulatory Visit (INDEPENDENT_AMBULATORY_CARE_PROVIDER_SITE_OTHER): Payer: PPO | Admitting: Family Medicine

## 2019-04-25 VITALS — BP 110/68 | HR 69 | Temp 97.8°F | Resp 20 | Ht 66.0 in | Wt 158.0 lb

## 2019-04-25 DIAGNOSIS — G35 Multiple sclerosis: Secondary | ICD-10-CM | POA: Diagnosis not present

## 2019-04-25 DIAGNOSIS — Z23 Encounter for immunization: Secondary | ICD-10-CM | POA: Diagnosis not present

## 2019-04-25 DIAGNOSIS — I10 Essential (primary) hypertension: Secondary | ICD-10-CM

## 2019-04-25 DIAGNOSIS — I700 Atherosclerosis of aorta: Secondary | ICD-10-CM | POA: Diagnosis not present

## 2019-04-25 DIAGNOSIS — E559 Vitamin D deficiency, unspecified: Secondary | ICD-10-CM

## 2019-04-25 DIAGNOSIS — K581 Irritable bowel syndrome with constipation: Secondary | ICD-10-CM | POA: Insufficient documentation

## 2019-04-25 DIAGNOSIS — E1159 Type 2 diabetes mellitus with other circulatory complications: Secondary | ICD-10-CM | POA: Diagnosis not present

## 2019-04-25 DIAGNOSIS — E1169 Type 2 diabetes mellitus with other specified complication: Secondary | ICD-10-CM

## 2019-04-25 DIAGNOSIS — R972 Elevated prostate specific antigen [PSA]: Secondary | ICD-10-CM | POA: Diagnosis not present

## 2019-04-25 DIAGNOSIS — E119 Type 2 diabetes mellitus without complications: Secondary | ICD-10-CM

## 2019-04-25 DIAGNOSIS — E785 Hyperlipidemia, unspecified: Secondary | ICD-10-CM

## 2019-04-25 LAB — BAYER DCA HB A1C WAIVED: HB A1C (BAYER DCA - WAIVED): 6.7 % (ref <7.0)

## 2019-04-25 MED ORDER — LUBIPROSTONE 8 MCG PO CAPS: 8.0000 ug | ORAL_CAPSULE | Freq: Two times a day (BID) | ORAL | 3 refills | Status: AC

## 2019-04-25 NOTE — Patient Instructions (Signed)
It was a pleasure seeing you today, Jacob Fowler.  Information regarding what we discussed is included in this packet.  Please make an appointment to see me in 4 months.   In a few days you may receive a survey in the mail or online from [employer] regarding your visit with [place] today. Please take a moment to fill this out. Your feedback is very important to our office. It can help [place] better understand your needs as well as improve your experience and satisfaction. Thank you for taking your time to complete it. We care about you.  Because of recent events of COVID-19 ("Coronavirus"), please follow CDC recommendations:   1. Wash your hand frequently 2. Avoid touching your face 3. Stay away from people who are sick 4. If you have symptoms such as fever, cough, shortness of breath then call your healthcare provider for further guidance 5. If you are sick, STAY AT HOME, unless otherwise directed by your healthcare provider. 6. Follow directions from state and national officials regarding staying safe    Please feel free to call our office if any questions or concerns arise.  Warm Regards, Monia Pouch, FNP-C [facility] [address] [place], [place] [address] [phone]

## 2019-04-25 NOTE — Progress Notes (Signed)
Subjective:  [name], male    DOB: 20-Apr-1951, 68 y.o.   MRN: [id number]  Patient Care Team: Baruch Gouty, FNP as PCP - General (Family Medicine) Sater, Nanine Means, MD (Neurology) Kathie Rhodes, MD as Consulting Physician (Urology) Minus Breeding, MD as Consulting Physician (Cardiology)   Chief Complaint:  Medical Management of Chronic Issues (4 mo ), Hyperlipidemia, Hypertension, and Diabetes   HPI: Jacob Fowler is a 68 y.o. male presenting on 04/25/2019 for Medical Management of Chronic Issues (4 mo ), Hyperlipidemia, Hypertension, and Diabetes  1. Hypertension associated with type 2 diabetes mellitus ([place]) Complaint with meds - Yes Current Medications - Norvasc, Imdur, Lisinopril Checking BP at home - No Exercising Regularly - No Watching Salt intake - Yes Pertinent ROS:  Headache - No Fatigue - No Visual Disturbances - No Chest pain - No Dyspnea - No Palpitations - No LE edema - No They report good compliance with medications and can restate their regimen by memory. No medication side effects.  Family, social, and smoking history reviewed.   BP Readings from Last 3 Encounters:  04/25/19 110/68  03/03/19 114/67  09/02/18 118/65   CMP Latest Ref Rng & Units 12/25/2018 08/07/2018 03/13/2018  Glucose 65 - 99 mg/dL 134(H) 137(H) 95  BUN 8 - 27 mg/dL 20 16 26   Creatinine 0.76 - 1.27 mg/dL 1.11 1.11 1.06  Sodium 134 - 144 mmol/L 140 141 143  Potassium 3.5 - 5.2 mmol/L 4.1 4.3 4.4  Chloride 96 - 106 mmol/L 104 102 104  CO2 20 - 29 mmol/L 20 22 22   Calcium 8.6 - 10.2 mg/dL 9.6 9.8 9.9  Total Protein 6.0 - 8.5 g/dL 7.0 7.2 7.2  Total Bilirubin 0.0 - 1.2 mg/dL 0.4 0.3 0.3  Alkaline Phos 39 - 117 IU/L 70 72 66  AST 0 - 40 IU/L 34 24 19  ALT 0 - 44 IU/L 26 21 14       2. Hyperlipidemia associated with type 2 diabetes mellitus (HCC) Compliant with medications - Yes Current medications - Crestor Side effects from medications - No Diet -  generally healthy Exercise - limited by MS  Lab Results  Component Value Date   CHOL 107 12/25/2018   HDL 28 (L) 12/25/2018   LDLCALC 66 12/25/2018   TRIG 64 12/25/2018   CHOLHDL 3.8 12/25/2018     Family and personal medical history reviewed. Smoking and ETOH history reviewed.    3. Aortic atherosclerosis (HCC) Currently on ASA and statin therapy. Does try to watch diet. Not very active due to limitations caused by MS.  4. Vitamin D deficiency Pt is taking oral repletion therapy. Denies bone pain and tenderness, muscle weakness, fracture, and difficulty walking. Lab Results  Component Value Date   VD25OH 47.3 12/25/2018   VD25OH 43.3 08/07/2018   VD25OH 45.3 03/13/2018   Lab Results  Component Value Date   CALCIUM 9.6 12/25/2018      5. Diabetes mellitus type 2 in nonobese (HCC) Pt presents for follow up evaluation of Type 2 diabetes mellitus.  Current symptoms include paresthesia of the feet and have been stable. Symptoms have been present for several months. Patient denies foot ulcerations, hyperglycemia, hypoglycemia , increased appetite, nausea, polydipsia, polyuria, visual disturbances, vomiting and weight loss.  Current diabetic medications include oral agent (monotherapy): Glucophage.  Compliant with meds - Yes  Current monitoring regimen: home blood tests - several times weekly Home blood sugar records: trend: stable, running 120-140 Any  episodes of hypoglycemia? no  Known diabetic complications: cardiovascular disease Cardiovascular risk factors: advanced age (older than 20 for men, 28 for women), diabetes mellitus, dyslipidemia, hypertension, male gender and sedentary lifestyle Eye exam current (within one year): No, scheduled at [facility] next month Podiatry yearly?  No Weight trend: stable Prior visit with CDE: yes  Current diet: in general, a "healthy" diet   Current exercise: none  PNA Vaccine UTD?  Yes Hep B Vaccine?  Yes Tdap Vaccine UTD?   Yes Urine microalbumin UTD? No  Is He on ACE inhibitor or angiotensin II receptor blocker?  Yes, Lisinopril Is He on statin? Yes Crestor Is He on ASA 81 mg daily?  Yes  6. Elevated PSA Previous PSA slightly elevated. Denies urinary symptoms. No penile, scrotal, or rectal pain. No noted genital swelling. No hematuria or dysuria.   7. Irritable bowel syndrome with constipation Ongoing for several years. Has tried increasing fiber and use of stool softeners and Miralax without relief of symptoms. Pt has MS which contributes to slow transit or neurogenic constipation. Pt states he does have abdominal discomfort and pain when constipated and states this resolved after having a BM. States last BM was three days ago. No melena or hematochezia.    8.Multiple Sclerosis ([place]) Pt followed regularly by neurology. Pt is in a wheelchair today. States he has been experiencing increased weakness over the last few weeks and did not want to use his cane in fear of falling when trying to navigate the office today. States he is still able to use his cane at home and get around well.   Relevant past medical, surgical, family, and social history reviewed and updated as indicated.  Allergies and medications reviewed and updated. Date reviewed: Chart in Epic.   Past Medical History:  Diagnosis Date   Acquired leg length discrepancy    Bladder stone    BPH associated with nocturia    CAD (coronary artery disease) CARDIOLOGIST- DR HOCHREIN--  LAST VISIT 09-27-2011 IN EPIC   Non obstructive Cath 2010-  HX CORONARY SPASM 2004   Cataract    bilateral    GERD (gastroesophageal reflux disease)    H/O hiatal hernia    History of nephrolithiasis 2009   Hx of adenomatous colonic polyps    Hyperlipidemia    Hypertension    Movement disorder    Multiple sclerosis ([place]) DX  10/10---  NEUROLOGIST  DR Delfino Lovett [facility] ([place])   RIGHT SIDE AFFECTED MORE W/ WEAKNESS- - USES CANE   Neuromuscular  disorder (HCC)    MS   NIDDM (non-insulin dependent diabetes mellitus)    Peyronie's disease    RBBB    Renal stone RIGHT   Right ureteral stone    Rosacea    Vision abnormalities     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 / NORMAL LVF   CYSTOSCOPY W/ RETROGRADES  10/30/2011   Procedure: CYSTOSCOPY WITH RETROGRADE PYELOGRAM;  Surgeon: Claybon Jabs, MD;  Location: [facility];  Service: Urology;  Laterality: Right;   CYSTOSCOPY WITH LITHOLAPAXY  10/30/2011   Procedure: CYSTOSCOPY WITH LITHOLAPAXY;  Surgeon: Claybon Jabs, MD;  [facility];  Service: Urology;  Laterality: N/A;   EXTRACORPOREAL SHOCK WAVE LITHOTRIPSY  09-05-2010   RIGHT   EYE SURGERY Right 08/30/2018   NESBIT PROCEDURE  10-17-2004  CORRECTION OF PENILE ANGULATION (PEYRONIES DISEASE)    Social History   Socioeconomic History   Marital status: Married    Spouse name: Hassan Rowan   Number of children: 2   Years of education: 13   Highest education level: Some college, no degree  Occupational History   Occupation: retired    Comment: self -employed farmer  Scientist, product/process development strain: Not hard at all   Food insecurity    Worry: Never true    Inability: Never true   Transportation needs    Medical: No    Non-medical: No  Tobacco Use   Smoking status: Former Smoker    Packs/day: 1.00    Years: 7.00    Pack years: 7.00    Types: Cigarettes    Quit date: 09/26/1973    Years since quitting: 45.6   Smokeless tobacco: Current User    Types: Chew   Tobacco comment: Chewing tobacco  Substance and Sexual Activity   Alcohol use: No    Alcohol/week: 0.0 standard drinks   Drug use: No   Sexual activity: Not on file  Lifestyle   Physical activity    Days per week: 6 days    Minutes per session: 40 min   Stress:  Only a little  Relationships   Social connections    Talks on phone: More than three times a week    Gets together: More than three times a week    Attends religious service: More than 4 times per year    Active member of club or organization: No    Attends meetings of clubs or organizations: Never    Relationship status: Married   Intimate partner violence    Fear of current or ex partner: No    Emotionally abused: No    Physically abused: No    Forced sexual activity: No  Other Topics Concern   Not on file  Social History Narrative   Not on file    Outpatient Encounter Medications as of 04/25/2019  Medication Sig   amLODipine (NORVASC) 2.5 MG tablet Take 1 tablet (2.5 mg total) by mouth every morning.   aspirin (LONGS ADULT LOW STRENGTH ASA) 81 MG EC tablet Take 81 mg by mouth daily.    BETASERON 0.3 MG KIT injection Inject 0.25 mg into the skin every other day.   blood glucose meter kit and supplies Dispense based on patient and insurance preference. Check BS TID and PRN  (FOR ICD dx E11.9).   cholecalciferol (VITAMIN D) 1000 UNITS tablet Take 1,000 Units by mouth daily.   cyanocobalamin (,VITAMIN B-12,) 1000 MCG/ML injection INJECT 1ML INTO THE SKIN ONCE MONTH   desmopressin (DDAVP) 0.1 MG tablet Take 1 tablet (0.1 mg total) by mouth daily.   fish oil-omega-3 fatty acids 1000 MG capsule Take 2 g by mouth 2 (two) times daily.   glucose blood test strip Use as instructed   isosorbide mononitrate (IMDUR) 60 MG 24 hr tablet Take 0.5 tablets (30 mg total) by mouth daily.   levothyroxine (SYNTHROID) 50 MCG tablet TAKE ONE TABLET BY MOUTH DAILY BEFORE BREAKFAST   lisinopril (ZESTRIL) 10 MG tablet Take 1 tablet (10 mg total) by mouth daily.   metFORMIN (GLUCOPHAGE) 500 MG tablet TAKE TWO TABLETS BY MOUTH TWICE DAILY WITH MEALS.   omeprazole (PRILOSEC) 20 MG capsule Take 1 capsule (20 mg total) by mouth daily.   pioglitazone (ACTOS) 30 MG tablet Take 1 tablet (30 mg  total) by mouth daily.  rosuvastatin (CRESTOR) 20 MG tablet Take 1 tablet (20 mg total) by mouth daily.   SYRINGE-NEEDLE, DISP, 3 ML (B-D SYRINGE/NEEDLE 3CC/25GX5/8) 25G X 5/8" 3 ML MISC Use to give once a month B12 injections   tamsulosin (FLOMAX) 0.4 MG CAPS capsule Take 1 capsule (0.4 mg total) by mouth daily after supper.   [DISCONTINUED] doxycycline (VIBRA-TABS) 100 MG tablet Take 1 tablet (100 mg total) by mouth daily. (Patient taking differently: Take 100 mg by mouth every other day. )   lubiprostone (AMITIZA) 8 MCG capsule Take 1 capsule (8 mcg total) by mouth 2 (two) times daily with a meal.   sildenafil (VIAGRA) 100 MG tablet Take 1 tablet (100 mg total) by mouth daily as needed for erectile dysfunction. (Patient not taking: Reported on 04/25/2019)   [DISCONTINUED] atorvastatin (LIPITOR) 10 MG tablet Take 10 mg by mouth daily.     [DISCONTINUED] Icosapent Ethyl (VASCEPA) 1 g CAPS Take 2 capsules (2 g total) by mouth 2 (two) times daily.   [DISCONTINUED] niacin-simvastatin (SIMCOR) 500-20 MG 24 hr tablet Take 1 tablet by mouth at bedtime.     Facility-Administered Encounter Medications as of 04/25/2019  Medication   cyanocobalamin ((VITAMIN B-12)) injection 1,000 mcg    No Known Allergies  Review of Systems  Constitutional: Negative for activity change, appetite change, chills, diaphoresis, fatigue, fever and unexpected weight change.  HENT: Negative.   Eyes: Negative.  Negative for photophobia and visual disturbance.  Respiratory: Negative for cough, chest tightness and shortness of breath.   Cardiovascular: Negative for chest pain, palpitations and leg swelling.  Gastrointestinal: Positive for abdominal pain and constipation. Negative for abdominal distention, anal bleeding, blood in stool, diarrhea, nausea, rectal pain and vomiting.  Endocrine: Negative.  Negative for cold intolerance, heat intolerance, polydipsia, polyphagia and polyuria.  Genitourinary: Negative for  decreased urine volume, difficulty urinating, dysuria, flank pain, frequency, hematuria, penile pain, penile swelling, scrotal swelling, testicular pain and urgency.  Musculoskeletal: Positive for gait problem (MS, right foot drop and right lower extremity weakness). Negative for arthralgias, myalgias, neck pain and neck stiffness.  Skin: Negative.   Allergic/Immunologic: Negative.   Neurological: Positive for tremors (intermittent, MS) and weakness (generalized, MS). Negative for dizziness, seizures, syncope, facial asymmetry, speech difficulty, light-headedness, numbness and headaches.  Hematological: Negative.   Psychiatric/Behavioral: Negative for confusion, hallucinations, sleep disturbance and suicidal ideas.  All other systems reviewed and are negative.       Objective:  BP 110/68    Pulse 69    Temp 97.8 F (36.6 C)    Resp 20    Ht 5' 6"  (1.676 m)    Wt 158 lb (71.7 kg)    SpO2 98%    BMI 25.50 kg/m    Wt Readings from Last 3 Encounters:  04/25/19 158 lb (71.7 kg)  03/03/19 162 lb 6.4 oz (73.7 kg)  09/02/18 164 lb (74.4 kg)    Physical Exam Vitals signs and nursing note reviewed.  Constitutional:      General: He is not in acute distress.    Appearance: Normal appearance. He is well-developed and well-groomed. He is not ill-appearing, toxic-appearing or diaphoretic.  HENT:     Head: Normocephalic and atraumatic.     Jaw: There is normal jaw occlusion.     Right Ear: Hearing, tympanic membrane, ear canal and external ear normal.     Left Ear: Hearing, tympanic membrane, ear canal and external ear normal.     Nose: Nose normal.     Mouth/Throat:  Lips: Pink.     Mouth: Mucous membranes are moist.     Pharynx: Oropharynx is clear. Uvula midline.  Eyes:     General: Lids are normal.     Extraocular Movements: Extraocular movements intact.     Conjunctiva/sclera: Conjunctivae normal.     Pupils: Pupils are equal, round, and reactive to light.  Neck:      Musculoskeletal: Normal range of motion and neck supple.     Thyroid: No thyroid mass, thyromegaly or thyroid tenderness.     Vascular: No carotid bruit or JVD.     Trachea: Trachea and phonation normal.  Cardiovascular:     Rate and Rhythm: Normal rate and regular rhythm.     Chest Wall: PMI is not displaced.     Pulses: Normal pulses.     Heart sounds: Normal heart sounds. No murmur. No friction rub. No gallop.   Pulmonary:     Effort: Pulmonary effort is normal. No respiratory distress.     Breath sounds: Normal breath sounds. No wheezing.  Abdominal:     General: Bowel sounds are normal. There is no distension or abdominal bruit.     Palpations: Abdomen is soft. There is no hepatomegaly or splenomegaly.     Tenderness: There is no abdominal tenderness. There is no right CVA tenderness or left CVA tenderness.     Hernia: No hernia is present.  Musculoskeletal:     Right lower leg: No edema.     Left lower leg: No edema.     Right foot: Decreased range of motion. Normal capillary refill. Deformity (right foot drop, AFO in place) present. No tenderness, bony tenderness, swelling, crepitus or laceration.     Comments: Right lower extremity weakness, right foot drop (AFO) in place.   Lymphadenopathy:     Cervical: No cervical adenopathy.  Skin:    General: Skin is warm and dry.     Capillary Refill: Capillary refill takes less than 2 seconds.     Coloration: Skin is not cyanotic, jaundiced or pale.     Findings: No rash.  Neurological:     Mental Status: He is alert and oriented to person, place, and time.     Cranial Nerves: Cranial nerves are intact. No cranial nerve deficit.     Sensory: Sensation is intact.     Motor: Weakness (right lower extremity weakness, 2/5. Left lower extremity 4/5, bilateral upper extremities 5/5) present.     Gait: Gait abnormal (in wheelchair today due to weakness in RLE from MS).  Psychiatric:        Attention and Perception: Attention and perception  normal.        Mood and Affect: Mood and affect normal.        Speech: Speech normal.        Behavior: Behavior normal. Behavior is cooperative.        Thought Content: Thought content normal.        Cognition and Memory: Cognition and memory normal.        Judgment: Judgment normal.     Results for orders placed or performed in visit on 04/25/19  Bayer DCA Hb A1c Waived  Result Value Ref Range   HB A1C (BAYER DCA - WAIVED) 6.7 <7.0 %       Pertinent labs & imaging results that were available during my care of the patient were reviewed by me and considered in my medical decision making.  Assessment & Plan:  [name] was seen  today for medical management of chronic issues, hyperlipidemia, hypertension and diabetes.  Diagnoses and all orders for this visit:  Hypertension associated with type 2 diabetes mellitus ([place]) BP well controlled. Changes were not made in regimen today. Goal BP is 130/80. Pt aware to report any persistent high or low readings. DASH diet and exercise encouraged. Exercise at least 150 minutes per week and increase as tolerated. Goal BMI > 25. Stress management encouraged. Avoid nicotine and tobacco product use. Avoid excessive alcohol and NSAID's. Avoid more than 2000 mg of sodium daily. Medications as prescribed. Follow up as scheduled.  -     CBC with Differential/Platelet -     CMP14+EGFR  Hyperlipidemia associated with type 2 diabetes mellitus ([place]) Diet encouraged - increase intake of fresh fruits and vegetables, increase intake of lean proteins. Bake, broil, or grill foods. Avoid fried, greasy, and fatty foods. Avoid fast foods. Increase intake of fiber-rich whole grains. Exercise encouraged - at least 150 minutes per week and advance as tolerated.  Goal BMI < 25. Continue medications as prescribed. Follow up in 3-6 months as discussed.  -     CBC with Differential/Platelet -     Lipid panel  Aortic atherosclerosis (HCC) Continue ASA and statin therapy. Labs  pending.  -     CBC with Differential/Platelet -     Lipid panel  Vitamin D deficiency Labs pending. Continue repletion therapy. If indicated, will change repletion dosage. Eat foods rich in Vit D including milk, orange juice, yogurt with vitamin D added, salmon or mackerel, canned tuna fish, cereals with vitamin D added, and cod liver oil. Get out in the sun but make sure to wear at least SPF 30 sunscreen.  -     CBC with Differential/Platelet -     VITAMIN D 25 Hydroxy (Vit-D Deficiency, Fractures)  Irritable bowel syndrome with constipation Ongoing symptoms for over 2 years. Amitiza works well for neurogenic constipation and IBS constipation type. Reported symptoms consistent with IBS constipation that is complicated by MS. Has failed stool softeners and Miralax therapy. Will trial below. Pt aware to report any new or worsening symptoms.  -     lubiprostone (AMITIZA) 8 MCG capsule; Take 1 capsule (8 mcg total) by mouth 2 (two) times daily with a meal.  Diabetes mellitus type 2 in nonobese [facility]) Lab Results  Component Value Date   HGBA1C 6.7 04/25/2019   HGBA1C 7.1 (H) 12/25/2018   HGBA1C 6.9 08/07/2018    Diabetes Control: good, A1C 6.7 today Instruction/counseling given: reminded to get eye exam, reminded to bring blood glucose meter & log to each visit, reminded to bring medications to each visit, discussed foot care, discussed diet and provided printed educational material   1.  Rx changes: none 2.  Education: Reviewed ABCs of diabetes management (respective goals in parentheses):  A1C (<7), blood pressure (<130/80), BMI (<25), and cholesterol (LDL <100). 3.  Discussed pathophysiology of DM; difference between type 1 and type 2 DM. 4.  CHO counting diet discussed.  Reviewed CHO amount in various foods and how to read nutrition labels.  Discussed recommended serving sizes.  5.  Recommend check BG several times a week and record readings to bring to next appointment. Report  persistent high or low readings. 6.  Recommended increase physical activity - goal is 150 minutes per week and advance as tolerated. 7.  Adequate sleep, at least 6-8 hours per night.  8.  Smoking cessation.  9.  Follow up: 3 months  -  CBC with Differential/Platelet -     Bayer DCA Hb A1c Waived  Elevated PSA Last PSA elevated. Will recheck today and refer to urology if it remains elevated.  -     PSA, total and free  Multiple sclerosis ([place]) Followed by neurology on a regular basis. Has had some increased weakness but still able to walk at home with a cane.   Need for immunization against influenza -     Flu Vaccine QUAD High Dose(Fluad)   Total time spent with patient 45 minutes.  Greater than 50% of encounter spent in coordination of care/counseling.   Continue all other maintenance medications.  Follow up plan: Return in about 4 months (around 08/26/2019), or if symptoms worsen or fail to improve.  Continue healthy lifestyle choices, including diet (rich in fruits, vegetables, and lean proteins, and low in salt and simple carbohydrates) and exercise (at least 30 minutes of moderate physical activity daily).  Educational handout given for survey, COVID-19  The above assessment and management plan was discussed with the patient. The patient verbalized understanding of and has agreed to the management plan. Patient is aware to call the clinic if they develop any new symptoms or if symptoms persist or worsen. Patient is aware when to return to the clinic for a follow-up visit. Patient educated on when it is appropriate to go to the emergency department.   Monia Pouch, FNP-C [facility] [phone]

## 2019-04-26 LAB — VITAMIN D 25 HYDROXY (VIT D DEFICIENCY, FRACTURES): Vit D, 25-Hydroxy: 44.8 ng/mL (ref 30.0–100.0)

## 2019-04-26 LAB — CMP14+EGFR
ALT: 21 IU/L (ref 0–44)
AST: 23 IU/L (ref 0–40)
Albumin/Globulin Ratio: 1.6 (ref 1.2–2.2)
Albumin: 4.2 g/dL (ref 3.8–4.8)
Alkaline Phosphatase: 61 IU/L (ref 39–117)
BUN/Creatinine Ratio: 14 (ref 10–24)
BUN: 15 mg/dL (ref 8–27)
Bilirubin Total: 0.4 mg/dL (ref 0.0–1.2)
CO2: 23 mmol/L (ref 20–29)
Calcium: 9.5 mg/dL (ref 8.6–10.2)
Chloride: 106 mmol/L (ref 96–106)
Creatinine, Ser: 1.04 mg/dL (ref 0.76–1.27)
GFR calc Af Amer: 84 mL/min/{1.73_m2} (ref >59)
GFR calc non Af Amer: 73 mL/min/{1.73_m2} (ref >59)
Globulin, Total: 2.6 g/dL (ref 1.5–4.5)
Glucose: 122 mg/dL — ABNORMAL HIGH (ref 65–99)
Potassium: 4.1 mmol/L (ref 3.5–5.2)
Sodium: 143 mmol/L (ref 134–144)
Total Protein: 6.8 g/dL (ref 6.0–8.5)

## 2019-04-26 LAB — LIPID PANEL
Chol/HDL Ratio: 4.1 ratio (ref 0.0–5.0)
Cholesterol, Total: 116 mg/dL (ref 100–199)
HDL: 28 mg/dL — ABNORMAL LOW (ref >39)
LDL Chol Calc (NIH): 66 mg/dL (ref 0–99)
Triglycerides: 119 mg/dL (ref 0–149)
VLDL Cholesterol Cal: 22 mg/dL (ref 5–40)

## 2019-04-26 LAB — CBC WITH DIFFERENTIAL/PLATELET
Basophils Absolute: 0 10*3/uL (ref 0.0–0.2)
Basos: 0 %
EOS (ABSOLUTE): 0.1 10*3/uL (ref 0.0–0.4)
Eos: 2 %
Hematocrit: 36.9 % — ABNORMAL LOW (ref 37.5–51.0)
Hemoglobin: 11.9 g/dL — ABNORMAL LOW (ref 13.0–17.7)
Immature Grans (Abs): 0 10*3/uL (ref 0.0–0.1)
Immature Granulocytes: 0 %
Lymphocytes Absolute: 0.5 10*3/uL — ABNORMAL LOW (ref 0.7–3.1)
Lymphs: 14 %
MCH: 27.9 pg (ref 26.6–33.0)
MCHC: 32.2 g/dL (ref 31.5–35.7)
MCV: 87 fL (ref 79–97)
Monocytes Absolute: 0.4 10*3/uL (ref 0.1–0.9)
Monocytes: 11 %
Neutrophils Absolute: 2.8 10*3/uL (ref 1.4–7.0)
Neutrophils: 73 %
Platelets: 165 10*3/uL (ref 150–450)
RBC: 4.26 x10E6/uL (ref 4.14–5.80)
RDW: 15.6 % — ABNORMAL HIGH (ref 11.6–15.4)
WBC: 3.9 10*3/uL (ref 3.4–10.8)

## 2019-04-26 LAB — PSA, TOTAL AND FREE
PSA, Free Pct: 21.4 %
PSA, Free: 1.07 ng/mL
Prostate Specific Ag, Serum: 5 ng/mL — ABNORMAL HIGH (ref 0.0–4.0)

## 2019-04-28 ENCOUNTER — Telehealth: Payer: Self-pay | Admitting: Family Medicine

## 2019-04-28 NOTE — Addendum Note (Signed)
Addended by: Baruch Gouty on: 04/28/2019 10:01 AM   Modules accepted: Orders

## 2019-04-28 NOTE — Telephone Encounter (Signed)
Called pt = I found an assistance link for him to call to try and get up to 6 mos free.

## 2019-04-28 NOTE — Telephone Encounter (Signed)
I feel all of these medications will be pricey. Miralax is not helpful per pt report. I am not sure if they have a program to help with cost.

## 2019-04-28 NOTE — Telephone Encounter (Signed)
Pt called - Jacob Fowler is going to cost him $400 a rx ( may be deductible)

## 2019-05-26 ENCOUNTER — Telehealth: Payer: Self-pay | Admitting: Neurology

## 2019-05-26 DIAGNOSIS — M21371 Foot drop, right foot: Secondary | ICD-10-CM

## 2019-05-26 NOTE — Telephone Encounter (Signed)
Pt called wanting to discuss with RN a brace that he has found for drop foot. Please advise. Pt states he will be out of the house this morning and he would like to receive a call back this afternoon.

## 2019-05-26 NOTE — Telephone Encounter (Signed)
Pt returned call. Please call back when available. 

## 2019-05-26 NOTE — Telephone Encounter (Signed)
Called and spoke with pt. He received a book from [place] talking about L300 go brace/momentum for foot drop. He wants to know if Dr. Felecia Shelling has heard of this and his thoughts. Advised I will send message to him and call back today or tomorrow.  He has paper from [employer] pt assistance for Bet[employer]. He will drop off early next week for [place], [place]. Aware he saw AL,NP last.

## 2019-05-26 NOTE — Telephone Encounter (Signed)
I have had a few my MS patients try this type of brace.  It works by shocking the nerve to help to lift the foot up as you walk.  I am not sure how well it would work since he also has weakness at the hip and knee (it seems to work best for people with a pure foot drop).  If he did want to try it we could refer to a physical therapist to see if he is a candidate.  I am not sure whether Medicare covers the brace or not.

## 2019-05-26 NOTE — Telephone Encounter (Signed)
Called, LVM returning pt's call 

## 2019-05-27 NOTE — Addendum Note (Signed)
Addended by: Hope Pigeon on: 05/27/2019 08:58 AM   Modules accepted: Orders

## 2019-05-27 NOTE — Telephone Encounter (Signed)
I called pt back. Relayed Dr. Garth Bigness recommendation. He is agreeable to this. He would like referral placed to: [facility] Address: [address], [place] [address], [place] [address] Phone: [phone]  Placed referral and advised him to call back if he has further questions/concerns.

## 2019-06-02 DIAGNOSIS — E119 Type 2 diabetes mellitus without complications: Secondary | ICD-10-CM | POA: Diagnosis not present

## 2019-06-02 DIAGNOSIS — H2512 Age-related nuclear cataract, left eye: Secondary | ICD-10-CM | POA: Diagnosis not present

## 2019-06-02 NOTE — Telephone Encounter (Signed)
Jennifer [email or web address] is asking for a call from RN to discuss if PT is also being requested for in addition to the fitting for the L300 GoBrace please call, Anderson [place] states if she is not available ask for [place]

## 2019-06-03 NOTE — Telephone Encounter (Signed)
I contacted Jacob Fowler back and [facility] and lvm. I advised this order was for PT evaluation/L300 GoBrace fitting as well. I advised to call back if she had any further questions.

## 2019-06-04 ENCOUNTER — Telehealth: Payer: Self-pay

## 2019-06-04 NOTE — Telephone Encounter (Signed)
Patient assistance paper work has been completed and faxed to [employer] [place] Patient assistance [employer].

## 2019-06-09 ENCOUNTER — Other Ambulatory Visit: Payer: Self-pay | Admitting: Family Medicine

## 2019-06-30 DIAGNOSIS — R2689 Other abnormalities of gait and mobility: Secondary | ICD-10-CM | POA: Diagnosis not present

## 2019-06-30 DIAGNOSIS — G35 Multiple sclerosis: Secondary | ICD-10-CM | POA: Diagnosis not present

## 2019-06-30 DIAGNOSIS — M21371 Foot drop, right foot: Secondary | ICD-10-CM | POA: Diagnosis not present

## 2019-06-30 NOTE — Telephone Encounter (Signed)
I reached out to the [place] patient assistance and spok/e with [place]. She reports the application we faxed on 06/04/2019 was received but was not processed correctly. She states she is going to send a message to the case manager so the forms can be processed appropriately. She states to give the case manager a week to process and then the pt can call and check the status. I called to the pt back and advised of this information. He verbalized understanding and appreciation for the call.  # for [employer] patient assistance is [phone].

## 2019-06-30 NOTE — Telephone Encounter (Signed)
Pt called wanting to know the update on his paperwork because he has received more paperwork in the mail for the same thing. Please advise.

## 2019-07-03 DIAGNOSIS — G35 Multiple sclerosis: Secondary | ICD-10-CM | POA: Diagnosis not present

## 2019-07-03 DIAGNOSIS — R2689 Other abnormalities of gait and mobility: Secondary | ICD-10-CM | POA: Diagnosis not present

## 2019-07-03 DIAGNOSIS — M21371 Foot drop, right foot: Secondary | ICD-10-CM | POA: Diagnosis not present

## 2019-07-07 DIAGNOSIS — M21371 Foot drop, right foot: Secondary | ICD-10-CM | POA: Diagnosis not present

## 2019-07-07 DIAGNOSIS — G35 Multiple sclerosis: Secondary | ICD-10-CM | POA: Diagnosis not present

## 2019-07-07 DIAGNOSIS — R2689 Other abnormalities of gait and mobility: Secondary | ICD-10-CM | POA: Diagnosis not present

## 2019-07-09 ENCOUNTER — Telehealth: Payer: Self-pay | Admitting: Family Medicine

## 2019-07-09 ENCOUNTER — Encounter: Payer: Self-pay | Admitting: Family Medicine

## 2019-07-09 ENCOUNTER — Ambulatory Visit (INDEPENDENT_AMBULATORY_CARE_PROVIDER_SITE_OTHER): Payer: PPO | Admitting: Family Medicine

## 2019-07-09 DIAGNOSIS — B0231 Zoster conjunctivitis: Secondary | ICD-10-CM | POA: Diagnosis not present

## 2019-07-09 MED ORDER — PREDNISONE 10 MG PO TABS: ORAL_TABLET | ORAL | 0 refills | Status: DC

## 2019-07-09 MED ORDER — VALACYCLOVIR HCL 1 G PO TABS: 1000.0000 mg | ORAL_TABLET | Freq: Three times a day (TID) | ORAL | 0 refills | Status: DC

## 2019-07-09 NOTE — Telephone Encounter (Signed)
I called pt and he saw his pcp, Dr. Claretta Fraise today and was given prednisone, and valtrex for R eye shingles.  FYI

## 2019-07-09 NOTE — Telephone Encounter (Signed)
Patient called in and stated he wanted to inform that he has shingles in his right eye and around it

## 2019-07-09 NOTE — Telephone Encounter (Signed)
Patient with complaints of redness and pain to side of face. He has blisters as well. Telephone visit made and patient notified

## 2019-07-09 NOTE — Telephone Encounter (Signed)
Noted  

## 2019-07-09 NOTE — Progress Notes (Signed)
Subjective:    [name], male    DOB: 02-04-1951, 68 y.o.   MRN: [id number]   HPI: Jacob Fowler is a 68 y.o. male presenting for possible shingles. Tender spots red and splotchy on right side of face. Includes one at the tip of his nose. Conjunctivae are red. Itching. Some pain, mild but increasing over about 2-3 days.   Depression screen [facility] 2/9 04/25/2019 11/12/2018 08/07/2018 03/13/2018 11/26/2017  Decreased Interest 0 0 0 0 0  Down, Depressed, Hopeless 0 0 0 0 0  PHQ - 2 Score 0 0 0 0 0     Relevant past medical, surgical, family and social history reviewed and updated as indicated.  Interim medical history since our last visit reviewed. Allergies and medications reviewed and updated.  ROS:  Review of Systems  Constitutional: Negative for fever.  Respiratory: Negative for shortness of breath.   Cardiovascular: Negative for chest pain.  Musculoskeletal: Negative for arthralgias.  Skin: Positive for rash.     Social History   Tobacco Use  Smoking Status Former Smoker  . Packs/day: 1.00  . Years: 7.00  . Pack years: 7.00  . Types: Cigarettes  . Quit date: 09/26/1973  . Years since quitting: 45.8  Smokeless Tobacco Current User  . Types: Chew  Tobacco Comment   Chewing tobacco       Objective:     Wt Readings from Last 3 Encounters:  04/25/19 158 lb (71.7 kg)  03/03/19 162 lb 6.4 oz (73.7 kg)  09/02/18 164 lb (74.4 kg)     Exam deferred. Pt. Harboring due to COVID 19. Phone visit performed.   Assessment & Plan:   1. Herpes zoster conjunctivitis     Meds ordered this encounter  Medications  . valACYclovir (VALTREX) 1000 MG tablet    Sig: Take 1 tablet (1,000 mg total) by mouth 3 (three) times daily.    Dispense:  21 tablet    Refill:  0  . predniSONE (DELTASONE) 10 MG tablet    Sig: Take 5 daily for 2 days followed by 4,3,2 and 1 for 2 days each.    Dispense:  30 tablet    Refill:  0    No orders of the defined types were  placed in this encounter.     Diagnoses and all orders for this visit:  Herpes zoster conjunctivitis  Other orders -     valACYclovir (VALTREX) 1000 MG tablet; Take 1 tablet (1,000 mg total) by mouth 3 (three) times daily. -     predniSONE (DELTASONE) 10 MG tablet; Take 5 daily for 2 days followed by 4,3,2 and 1 for 2 days each.  Wife advised to take him to [facility] to check for deeper ocular disease. She refused, stating he can barely walk and he would be required to go in alone due to CoVID. As a concession, I suggested that his neuologist that treats the MS should be contacted for advice.  Virtual Visit via telephone Note  I discussed the limitations, risks, security and privacy concerns of performing an evaluation and management service by telephone and the availability of in person appointments. The patient was identified with two identifiers. Pt.expressed understanding and agreed to proceed. Pt. Is at home. Dr. Livia Snellen is in his office.  Follow Up Instructions:   I discussed the assessment and treatment plan with the patient. The patient was provided an opportunity to ask questions and all were answered. The patient agreed with the  plan and demonstrated an understanding of the instructions.   The patient was advised to call back or seek an in-person evaluation if the symptoms worsen or if the condition fails to improve as anticipated.   Total minutes including chart review and phone contact time: 21   Follow up plan: Return if symptoms worsen or fail to improve.  Claretta Fraise, MD [place]

## 2019-07-24 DIAGNOSIS — G35 Multiple sclerosis: Secondary | ICD-10-CM | POA: Diagnosis not present

## 2019-07-24 DIAGNOSIS — M21371 Foot drop, right foot: Secondary | ICD-10-CM | POA: Diagnosis not present

## 2019-07-24 DIAGNOSIS — R2689 Other abnormalities of gait and mobility: Secondary | ICD-10-CM | POA: Diagnosis not present

## 2019-08-01 DIAGNOSIS — M21371 Foot drop, right foot: Secondary | ICD-10-CM | POA: Diagnosis not present

## 2019-08-01 DIAGNOSIS — G35 Multiple sclerosis: Secondary | ICD-10-CM | POA: Diagnosis not present

## 2019-08-01 DIAGNOSIS — R2689 Other abnormalities of gait and mobility: Secondary | ICD-10-CM | POA: Diagnosis not present

## 2019-08-04 DIAGNOSIS — M21371 Foot drop, right foot: Secondary | ICD-10-CM | POA: Diagnosis not present

## 2019-08-04 DIAGNOSIS — R2689 Other abnormalities of gait and mobility: Secondary | ICD-10-CM | POA: Diagnosis not present

## 2019-08-04 DIAGNOSIS — G35 Multiple sclerosis: Secondary | ICD-10-CM | POA: Diagnosis not present

## 2019-08-08 ENCOUNTER — Other Ambulatory Visit: Payer: Self-pay | Admitting: Family Medicine

## 2019-08-08 DIAGNOSIS — L03116 Cellulitis of left lower limb: Secondary | ICD-10-CM

## 2019-08-08 DIAGNOSIS — G35 Multiple sclerosis: Secondary | ICD-10-CM | POA: Diagnosis not present

## 2019-08-08 DIAGNOSIS — M21371 Foot drop, right foot: Secondary | ICD-10-CM | POA: Diagnosis not present

## 2019-08-08 DIAGNOSIS — R2689 Other abnormalities of gait and mobility: Secondary | ICD-10-CM | POA: Diagnosis not present

## 2019-08-11 ENCOUNTER — Encounter: Payer: Self-pay | Admitting: Neurology

## 2019-08-11 ENCOUNTER — Ambulatory Visit: Payer: PPO | Admitting: Neurology

## 2019-08-11 ENCOUNTER — Other Ambulatory Visit: Payer: Self-pay

## 2019-08-11 ENCOUNTER — Other Ambulatory Visit: Payer: Self-pay | Admitting: *Deleted

## 2019-08-11 ENCOUNTER — Telehealth: Payer: Self-pay

## 2019-08-11 VITALS — BP 107/61 | HR 85 | Temp 97.7°F | Ht 66.0 in | Wt 160.5 lb

## 2019-08-11 DIAGNOSIS — R261 Paralytic gait: Secondary | ICD-10-CM

## 2019-08-11 DIAGNOSIS — M21371 Foot drop, right foot: Secondary | ICD-10-CM

## 2019-08-11 DIAGNOSIS — R413 Other amnesia: Secondary | ICD-10-CM | POA: Insufficient documentation

## 2019-08-11 DIAGNOSIS — R39198 Other difficulties with micturition: Secondary | ICD-10-CM

## 2019-08-11 DIAGNOSIS — G35 Multiple sclerosis: Secondary | ICD-10-CM | POA: Diagnosis not present

## 2019-08-11 MED ORDER — BETASERON 0.3 MG ~~LOC~~ KIT: 0.2500 mg | PACK | SUBCUTANEOUS | 11 refills | Status: DC

## 2019-08-11 NOTE — Progress Notes (Signed)
[facility]  PATIENT: Jacob Fowler DOB: Apr 07, 1951  REFERRING DOCTOR OR PCP:  Redge Gainer  _________________________________   HISTORICAL  CHIEF COMPLAINT:  Chief Complaint  Patient presents with  . Follow-up    RM 31 wiht wife. Last seen 03/03/2019  . Multiple Sclerosis    On Betaseron.   . Gait Problem    Uses cane/rollator. Has walker he uses in the house. Has had some falls but no injuries.     HISTORY OF PRESENT ILLNESS:  Jacob Fowler is a 69 year old man with multiple sclerosis.      Update 08/11/2019: He is slowly progressing. He had a definite exacerbation at the onset but has had slow worsening of gait and right leg since.    He has been on Betaseron and has no relapses.   Other med's have been discussed.   We had discussed several   He is using his cane outside the home and walker inside the house.  With the walker he can go long distances and can go a hundred feet (though this tires him and he needs rest).   He had a couple minor controlled falls.   He uses a plastic AFO on the right.  He denies weakness in the arms or left leg.    He is going to see a rep at the PT for an L300 brace.    MRI (2020 personally  reviewed) shows " T2 hyperintense lesions within the spinal cord to the left adjacent to C4-C5 and to the right adjacent to T1, unchanged in appearance when compared to the previous MRI."   MRI brain showed no change in MS lesions.    Bladder is doing well with the tamsulosin and desmopressin.   Vision is unchanged. He has fatigue.  He denies depression He is sleeping well.     He has reduced hearing which has slowly worsened, a little worse on the left  Update 03/03/2019 (see Amy Lomax's note)  Update 09/02/2018: He feels walking has mildly worsened and is slower.    He uses a cane outside and a walker inside his home.  He stumbles but no recent fall.   He uses an AFO.     He has more difficulty with some of the chores.     The right leg  is weak and spastic.     Bladder is doing well with the tamsulosin and desmopressin.   Vision is unchanged.   Fatigue is doing well and he sleeps well most nights.      He denies depression or cognitive issues.   He is on Betaseron and tolerates it well.    He denies recent exacerbation.     He is not interested in other medications.     Update 02/25/2018: He feels gait is doing worse with more stumbles and some minor falls.    No injuries.  He uses a walker at night in the house and the cane the rest of the day.   Using a walker is difficult on the ground at his house.  He does a lot of chores.  He takes care of some animals. Right leg is weak and spastic.    He feels the arm is doing well.      He feels bladder is doing well on tamsulosin and desmopressin  He notes fatigue every afternoons, worse when there is more heat.    He has insomnia twice a week with more trouble falling asleep.  Occasionally, he takes greater than one hour to sleep.    He feels mood is fine.      Cognition is doing ok.   He is on Betaseron and tolerates it well with no significant skin reactions and tolerable systemic reactions.   We have discussed other medications on several occasions (pills and IVs) he would like to stay on Betaseron.   Update 08/22/2017:    He feels he is doing fairly well. He continues on Betaseron and tolerates it well. His last CBC did show that the lymphocytes were reduced at 0.5. It was done 05/17/2017. He also notes that gait is a little worse. 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 He has had a couple falls. He uses an AFO on the right leg. Ampyra had not helped him in the past. Has more issues with his bladder. He is emptying better with the tamsulosin but he still has a lot of of urgency. He has nocturia about 5 times a night. Vision is fine.  He notes some fatigue but this is no worse than typical. He has no problems with mood or change in cognition.   From  02/19/2017: He denies any major changes though does feel his gait has worsened.   Since the last visit he has had his MRIs and blood work.   MRI's 09/04/16 were unchanged form 2016.   He has a plaque to the left at C4-C5 and to the right at T1, unchanged. He also has plaques in the brain consistent with MS.   Two are located in the brainstem.  CBC/LFTs were fine 01/02/17.   MS   He feels stable.  He is on Betaseron and he tolerates it well. We have discussed ocrelizumab as it can work for relapsing remitting as well as primary progressive MS which appears to be more consistent with his disease course  Gait/strength/sensation: He has poor gait due to right >> left leg weakness and spasticity.   He has a right AFO and it helps his foot drop some. With the cane, he can walk long distances. He does not like the walker.   He falls 1-2 times a month.  He loses balance if he takes some steps backwards.     He denies any numbness in the legs     He denies any significant numbness, weakness or clumsiness in the arms.   He he notes some spasticity in the right leg but he denies denies painful  spasms.     He tried Ampyra in the past but it has not helped.     Bladder: He has urinary frequency,  urgency and urinary hesitancy. Tamsulosin helps.  Oxybutynin did not help much and Myrbetriq was expensive  Now he needs to urinate q 2-3 hours during the day and 0 to 1 at night.  He has not had any incontinence.     Vision: He denies any MS related visual problems. He wears glasses for correction.  Fatigue/sleep: He is active and gets outside every day. 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   His fatigue is more physical and cognitive and is present as the day goes on and with heat  Ampyra did not help. Amantadine did not help   He sually starts every day feeling well rested.    He is sleeping well most nights.   He does not snore much and his wife has not  noted any apnea or  gasping.   Mood/cognition:   He denies depression or anxiety. He feels his short-term memory is mildly worse than last year but notes no other cognitive issue.   MS History:   He began to note significant difficulty with gait in January 2010.      He had some back x-rays. He was then referred to neurosurgery that noted that he had plaques in his spine c/w  MS. He had further testing including MRI of the brain and spinal tap. These were all consistent with multiple sclerosis and by November 2010 he had started Betaseron. He feels that he has been mostly stable over the past 5 years but has noted that his gait is a little worse than it was in 2010. About 2013, he started using a cane.  MRI data (Images personally reviewed at previous visit) MRI Images  09/04/16 were personally reviewed and compared.  I concur with the interpretations: IMPRESSION:  This MRI of the cervical spine with and without contrast shows the following: 1.    T2 hyperintense lesions within the spinal cord to the left adjacent to C4-C5 and to the right adjacent to T1, unchanged in appearance when compared to the previous MRI.   2.    Mild degenerative changes at C3-C4, C4-C5 and C6-C7 that did not lead to any nerve root compression. 3.    There is a normal enhancement pattern and there are no acute findings.   IMPRESSION:  This MRI of the brain with and without contrast shows the following: 1.    Multiple T2/FLAIR hyperintense foci in the periventricular, juxtacortical and deep white matter of the hemispheres and 2 foci in the brainstem in a pattern and configuration consistent with chronic demyelinating plaque associated with multiple sclerosis. None of the foci appears to be acute. When compared to the MRI dated 09/28/2014, there is no interval change. 2.   Mild to moderate cortical atrophy that has mildly progressed when compared to the previous MRI. 3.    There are no acute findings and there is a normal enhancement  pattern.  REVIEW OF SYSTEMS: Constitutional: No fevers, chills, sweats, or change in appetite.  He notes fatigue most afternoon.  He has occasional insomnia. Eyes: No visual changes, double vision, eye pain Ear, nose and throat: No hearing loss, ear pain, nasal congestion, sore throat Cardiovascular: No chest pain, palpitations Respiratory: No shortness of breath at rest or with exertion.   No wheezes GastrointestinaI: No nausea, vomiting, diarrhea, abdominal pain, fecal incontinence Genitourinary: urinary frequency and hesitancy  .  No nocturia now Musculoskeletal: No neck pain, back pain Integumentary: He denies rash or skin lesions. Neurological: as above Psychiatric: No depression at this time.  No anxiety Endocrine: No palpitations, diaphoresis, change in appetite, change in weigh or increased thirst Hematologic/Lymphatic: No anemia, purpura, petechiae. Allergic/Immunologic: No itchy/runny eyes, nasal congestion, recent allergic reactions, rashes  ALLERGIES: No Known Allergies  HOME MEDICATIONS:  Current Outpatient Medications:  .  amLODipine (NORVASC) 2.5 MG tablet, Take 1 tablet (2.5 mg total) by mouth every morning., Disp: 90 tablet, Rfl: 3 .  aspirin (LONGS ADULT LOW STRENGTH ASA) 81 MG EC tablet, Take 81 mg by mouth daily. , Disp: , Rfl:  .  BETASERON 0.3 MG KIT injection, Inject 0.25 mg into the skin every other day., Disp: 1 kit, Rfl: 11 .  blood glucose meter kit and supplies, Dispense based on patient and insurance preference. Check BS TID and PRN  (FOR ICD  dx E11.9)., Disp: 1 each, Rfl: 1 .  cholecalciferol (VITAMIN D) 1000 UNITS tablet, Take 1,000 Units by mouth daily., Disp: , Rfl:  .  cyanocobalamin (,VITAMIN B-12,) 1000 MCG/ML injection, INJECT 1ML INTO THE SKIN ONCE MONTH, Disp: 3 mL, Rfl: 3 .  desmopressin (DDAVP) 0.1 MG tablet, Take 1 tablet (0.1 mg total) by mouth daily., Disp: 90 tablet, Rfl: 3 .  doxycycline (VIBRAMYCIN) 100 MG capsule, TAKE ONE CAPSULE BY  MOUTH DAILY, Disp: 90 capsule, Rfl: 1 .  fish oil-omega-3 fatty acids 1000 MG capsule, Take 2 g by mouth 2 (two) times daily., Disp: , Rfl:  .  glucose blood test strip, Use as instructed, Disp: 100 each, Rfl: 12 .  isosorbide mononitrate (IMDUR) 60 MG 24 hr tablet, Take 0.5 tablets (30 mg total) by mouth daily., Disp: 45 tablet, Rfl: 3 .  levothyroxine (SYNTHROID) 50 MCG tablet, TAKE ONE TABLET BY MOUTH DAILY BEFORE BREAKFAST, Disp: 90 tablet, Rfl: 3 .  lisinopril (ZESTRIL) 10 MG tablet, Take 1 tablet (10 mg total) by mouth daily., Disp: 90 tablet, Rfl: 3 .  metFORMIN (GLUCOPHAGE) 500 MG tablet, TAKE TWO TABLETS BY MOUTH TWICE DAILY WITH MEALS, Disp: 120 tablet, Rfl: 3 .  omeprazole (PRILOSEC) 20 MG capsule, Take 1 capsule (20 mg total) by mouth daily., Disp: 90 capsule, Rfl: 3 .  pioglitazone (ACTOS) 30 MG tablet, Take 1 tablet (30 mg total) by mouth daily., Disp: 90 tablet, Rfl: 3 .  predniSONE (DELTASONE) 10 MG tablet, Take 5 daily for 2 days followed by 4,3,2 and 1 for 2 days each., Disp: 30 tablet, Rfl: 0 .  rosuvastatin (CRESTOR) 20 MG tablet, Take 1 tablet (20 mg total) by mouth daily., Disp: 90 tablet, Rfl: 1 .  sildenafil (VIAGRA) 100 MG tablet, Take 1 tablet (100 mg total) by mouth daily as needed for erectile dysfunction., Disp: 12 tablet, Rfl: 2 .  SYRINGE-NEEDLE, DISP, 3 ML (B-D SYRINGE/NEEDLE 3CC/25GX5/8) 25G X 5/8" 3 ML MISC, Use to give once a month B12 injections, Disp: 12 each, Rfl: 0 .  tamsulosin (FLOMAX) 0.4 MG CAPS capsule, Take 1 capsule (0.4 mg total) by mouth daily after supper., Disp: 90 capsule, Rfl: 3 .  valACYclovir (VALTREX) 1000 MG tablet, TAKE ONE TABLET (1000 MG TOTAL) BY MOUTH3 TIMES DAILY, Disp: 21 tablet, Rfl: 0  Current Facility-Administered Medications:  .  cyanocobalamin ((VITAMIN B-12)) injection 1,000 mcg, 1,000 mcg, Intramuscular, Q30 days, Chipper Herb, MD, 1,000 mcg at 03/02/15 1647  PAST MEDICAL HISTORY: Past Medical History:  Diagnosis Date  .  Acquired leg length discrepancy   . Bladder stone   . BPH associated with nocturia   . CAD (coronary artery disease) CARDIOLOGIST- DR HOCHREIN--  LAST VISIT 09-27-2011 IN [facility]   Non obstructive Cath 2010-  HX CORONARY SPASM 2004  . Cataract    bilateral   . GERD (gastroesophageal reflux disease)   . H/O hiatal hernia   . History of nephrolithiasis 2009  . Hx of adenomatous colonic polyps   . Hyperlipidemia   . Hypertension   . Movement disorder   . Multiple sclerosis ([place]) DX  10/10---  NEUROLOGIST  DR Delfino Lovett [facility] ([place])   RIGHT SIDE AFFECTED MORE W/ WEAKNESS- - USES CANE  . Neuromuscular disorder (HCC)    MS  . NIDDM (non-insulin dependent diabetes mellitus)   . Peyronie's disease   . RBBB   . Renal stone RIGHT  . Right ureteral stone   . Rosacea   . Vision abnormalities  PAST SURGICAL HISTORY: Past Surgical History:  Procedure Laterality Date  . CARDIAC CATHETERIZATION  02-26-2009 ---  DR Ida Rogue   NONOBSTRUCTIVE CAD/ 50% DISTAL RCA/ 30% LAD / NORMAL LVF  . CYSTOSCOPY W/ RETROGRADES  10/30/2011   Procedure: CYSTOSCOPY WITH RETROGRADE PYELOGRAM;  Surgeon: Claybon Jabs, MD;  Location: [facility];  Service: Urology;  Laterality: Right;  . CYSTOSCOPY WITH LITHOLAPAXY  10/30/2011   Procedure: CYSTOSCOPY WITH LITHOLAPAXY;  Surgeon: Claybon Jabs, MD;  Location: [facility];  Service: Urology;  Laterality: N/A;  . EXTRACORPOREAL SHOCK WAVE LITHOTRIPSY  09-05-2010   RIGHT  . EYE SURGERY Right 08/30/2018  . NESBIT PROCEDURE  10-17-2004   CORRECTION OF PENILE ANGULATION (PEYRONIES DISEASE)    FAMILY HISTORY: Family History  Problem Relation Age of Onset  . Stroke Mother   . Heart attack Mother   . Diabetes Mother   . Hypertension Mother   . Renal cancer Father   . Diabetes Sister   . Heart attack Brother   . Diabetes Son   . Heart attack Maternal Grandfather   . Heart attack Brother   . Bladder Cancer Brother      SOCIAL HISTORY:  Social History   Socioeconomic History  . Marital status: Married    Spouse name: Hassan Rowan  . Number of children: 2  . Years of education: 81  . Highest education level: Some college, no degree  Occupational History  . Occupation: retired    Comment: self -employed farmer  Tobacco Use  . Smoking status: Former Smoker    Packs/day: 1.00    Years: 7.00    Pack years: 7.00    Types: Cigarettes    Quit date: 09/26/1973    Years since quitting: 45.9  . Smokeless tobacco: Current User    Types: Chew  . Tobacco comment: Chewing tobacco  Substance and Sexual Activity  . Alcohol use: No    Alcohol/week: 0.0 standard drinks  . Drug use: No  . Sexual activity: Not on file  Other Topics Concern  . Not on file  Social History Narrative  . Not on file   Social Determinants of Health   Financial Resource Strain: Low Risk   . Difficulty of Paying Living Expenses: Not hard at all  Food Insecurity: No Food Insecurity  . Worried About Charity fundraiser in the Last Year: Never true  . Ran Out of Food in the Last Year: Never true  Transportation Needs: No Transportation Needs  . Lack of Transportation (Medical): No  . Lack of Transportation (Non-Medical): No  Physical Activity: Sufficiently Active  . Days of Exercise per Week: 6 days  . Minutes of Exercise per Session: 40 min  Stress: No Stress Concern Present  . Feeling of Stress : Only a little  Social Connections: Slightly Isolated  . Frequency of Communication with Friends and Family: More than three times a week  . Frequency of Social Gatherings with Friends and Family: More than three times a week  . Attends Religious Services: More than 4 times per year  . Active Member of Clubs or Organizations: No  . Attends Archivist Meetings: Never  . Marital Status: Married  Human resources officer Violence: Not At Risk  . Fear of Current or Ex-Partner: No  . Emotionally Abused: No  . Physically Abused: No   . Sexually Abused: No     PHYSICAL EXAM  Vitals:   08/11/19 0853  BP: 107/61  Pulse:  85  Temp: 97.7 F (36.5 C)  SpO2: 97%  Weight: 160 lb 8 oz (72.8 kg)  Height: 5' 6"  (1.676 m)    Body mass index is 25.91 kg/m.   General: The patient is well-developed and well-nourished and in no acute distress  Neurologic Exam  Mental status: The patient is alert and oriented x 3 at the time of the examination. The patient has apparent normal recent and remote memory, with an apparently normal attention span and concentration ability.   Speech is normal.  Cranial nerves: Extraocular movements are full.   There is good facial sensation to soft touch bilaterally.Facial strength is normal.  Trapezius and sternocleidomastoid strength is normal. No dysarthria is noted.  The tongue is midline, and the patient has symmetric elevation of the soft palate. No obvious hearing deficits are noted.  Motor:  Muscle bulk is normal.   There is increased muscle tone in the legs, right greater than left.  The strength was 2/5 in the right and 4/5 in the left hip flexures. Strength was 3/5 in the right quad and 4+/5 on the left. Distally strength was 3 in the right leg and 4 in the left leg. Strength was 5/5 in the arms.     Sensory: Sensory testing is intact to touch and vibration sensation in all 4 extremities.  Coordination: Cerebellar testing shows good finger-nose-finger.  He has reduced heel-to-shin on the left and is unable to do on the right Gait and station: Station is normal.  The gait is spastic.  He has a fairly severe right foot drop that is helped by the AFO.  The left leg is mildly weak as he walks  The right leg is also externally rotated with a hyperextended knee as he walks.     He cannot tandem walk.  Romberg is negative.   Reflexes:  Deep tendon reflexes were normal in the arms.  Deep tendon reflexes are increased in both legs, more on the right where he has a crossed adductor  response.    DIAGNOSTIC DATA (LABS, IMAGING, TESTING) - I reviewed patient records, labs, notes, testing and imaging myself where available.  Lab Results  Component Value Date   WBC 3.9 04/25/2019   HGB 11.9 (L) 04/25/2019   HCT 36.9 (L) 04/25/2019   MCV 87 04/25/2019   PLT 165 04/25/2019      Component Value Date/Time   NA 143 04/25/2019 0940   K 4.1 04/25/2019 0940   CL 106 04/25/2019 0940   CO2 23 04/25/2019 0940   GLUCOSE 122 (H) 04/25/2019 0940   GLUCOSE 130 (H) 01/06/2013 1311   BUN 15 04/25/2019 0940   CREATININE 1.04 04/25/2019 0940   CREATININE 1.13 01/06/2013 1311   CALCIUM 9.5 04/25/2019 0940   PROT 6.8 04/25/2019 0940   ALBUMIN 4.2 04/25/2019 0940   AST 23 04/25/2019 0940   ALT 21 04/25/2019 0940   ALKPHOS 61 04/25/2019 0940   BILITOT 0.4 04/25/2019 0940   GFRNONAA 73 04/25/2019 0940   GFRNONAA 69 01/06/2013 1311   GFRAA 84 04/25/2019 0940   GFRAA 80 01/06/2013 1311   Lab Results  Component Value Date   CHOL 116 04/25/2019   HDL 28 (L) 04/25/2019   LDLCALC 66 04/25/2019   TRIG 119 04/25/2019   CHOLHDL 4.1 04/25/2019   Lab Results  Component Value Date   HGBA1C 6.7 04/25/2019        ASSESSMENT AND PLAN  Multiple sclerosis (HCC)  Urinary dysfunction  Spastic gait  Right foot drop   1.   In the past we discussed some oral options but he wishes to continue 2.   He will continue the bladder medications.   3.   Continue to use a cane or walker.  We discussed the L3 100 brace.  It is reasonable to give it a try though since he has weakness in the entire leg I am not sure it will help him.  He does need to make sure that he is going to get a reasonable benefit because he may have to pay out-of-pocket. 4.  He will return to see me in 6 months or sooner if he has new or worsening neurologic symptoms.   Terion Hedman A. Felecia Shelling, MD, PhD 1/82/0990, 68:93 AM Certified in Neurology, Clinical Neurophysiology, Sleep Medicine, Pain Medicine and  Neuroimaging  [facility] [address] [place], [place] [address] [phone]

## 2019-08-11 NOTE — Telephone Encounter (Signed)
Rx pending MD signature and then will fax.

## 2019-08-11 NOTE — Telephone Encounter (Signed)
1) Medication(s) Requested (by name): BETASERON 0.3 MG KIT injection  2) Pharmacy of Choice: [facility] in [place]

## 2019-08-12 NOTE — Telephone Encounter (Signed)
Faxed printed/signed rx to [phone]. Received fax confirmation.

## 2019-08-18 DIAGNOSIS — R2689 Other abnormalities of gait and mobility: Secondary | ICD-10-CM | POA: Diagnosis not present

## 2019-08-18 DIAGNOSIS — M21371 Foot drop, right foot: Secondary | ICD-10-CM | POA: Diagnosis not present

## 2019-08-18 DIAGNOSIS — G35 Multiple sclerosis: Secondary | ICD-10-CM | POA: Diagnosis not present

## 2019-08-26 ENCOUNTER — Other Ambulatory Visit: Payer: Self-pay

## 2019-08-27 ENCOUNTER — Encounter: Payer: Self-pay | Admitting: Family Medicine

## 2019-08-27 ENCOUNTER — Ambulatory Visit (INDEPENDENT_AMBULATORY_CARE_PROVIDER_SITE_OTHER): Payer: PPO | Admitting: Family Medicine

## 2019-08-27 VITALS — BP 119/74 | HR 81 | Temp 98.9°F | Resp 20 | Ht 66.0 in | Wt 160.0 lb

## 2019-08-27 DIAGNOSIS — I152 Hypertension secondary to endocrine disorders: Secondary | ICD-10-CM

## 2019-08-27 DIAGNOSIS — G35 Multiple sclerosis: Secondary | ICD-10-CM

## 2019-08-27 DIAGNOSIS — E119 Type 2 diabetes mellitus without complications: Secondary | ICD-10-CM | POA: Diagnosis not present

## 2019-08-27 DIAGNOSIS — E785 Hyperlipidemia, unspecified: Secondary | ICD-10-CM

## 2019-08-27 DIAGNOSIS — E559 Vitamin D deficiency, unspecified: Secondary | ICD-10-CM | POA: Diagnosis not present

## 2019-08-27 DIAGNOSIS — E1159 Type 2 diabetes mellitus with other circulatory complications: Secondary | ICD-10-CM | POA: Diagnosis not present

## 2019-08-27 DIAGNOSIS — I10 Essential (primary) hypertension: Secondary | ICD-10-CM | POA: Diagnosis not present

## 2019-08-27 DIAGNOSIS — H579 Unspecified disorder of eye and adnexa: Secondary | ICD-10-CM

## 2019-08-27 DIAGNOSIS — E1169 Type 2 diabetes mellitus with other specified complication: Secondary | ICD-10-CM

## 2019-08-27 DIAGNOSIS — I700 Atherosclerosis of aorta: Secondary | ICD-10-CM | POA: Diagnosis not present

## 2019-08-27 DIAGNOSIS — R972 Elevated prostate specific antigen [PSA]: Secondary | ICD-10-CM

## 2019-08-27 LAB — BAYER DCA HB A1C WAIVED: HB A1C (BAYER DCA - WAIVED): 7.8 % — ABNORMAL HIGH (ref <7.0)

## 2019-08-27 NOTE — Patient Instructions (Signed)

## 2019-08-27 NOTE — Progress Notes (Signed)
Subjective:  [name], male    DOB: 1950-10-12, 69 y.o.   MRN: [id number]  Patient Care Team: Baruch Gouty, FNP as PCP - General (Family Medicine) Sater, Nanine Means, MD (Neurology) Kathie Rhodes, MD as Consulting Physician (Urology) Minus Breeding, MD as Consulting Physician (Cardiology)   Chief Complaint:  Medical Management of Chronic Issues (4 mo), Hypertension, and Diabetes   HPI: Jacob Fowler is a 69 y.o. male presenting on 08/27/2019 for Medical Management of Chronic Issues (4 mo), Hypertension, and Diabetes   1. Hypertension associated with type 2 diabetes mellitus ([place]) Complaint with meds - Yes Current Medications - norvasc, imdur, lisinopril Checking BP at home - No Exercising Regularly - No Watching Salt intake - Yes Pertinent ROS:  Headache - No Fatigue - No Visual Disturbances - No Chest pain - No Dyspnea - No Palpitations - No LE edema - No They report good compliance with medications and can restate their regimen by memory. No medication side effects.  Family, social, and smoking history reviewed.   BP Readings from Last 3 Encounters:  08/27/19 119/74  08/11/19 107/61  04/25/19 110/68   CMP Latest Ref Rng & Units 04/25/2019 12/25/2018 08/07/2018  Glucose 65 - 99 mg/dL 122(H) 134(H) 137(H)  BUN 8 - 27 mg/dL _0 Creatinine 0.76 - 1.27 mg/dL 1.04 1.11 1.11  Sodium 134 - 144 mmol/L 143 140 141  Potassium 3.5 - 5.2 mmol/L 4.1 4.1 4.3  Chloride 96 - 106 mmol/L 106 104 102  CO2 20 - 29 mmol/L _1 Calcium 8.6 - 10.2 mg/dL 9.5 9.6 9.8  Total Protein 6.0 - 8.5 g/dL 6.8 7.0 7.2  Total Bilirubin 0.0 - 1.2 mg/dL 0.4 0.4 0.3  Alkaline Phos 39 - 117 IU/L 61 70 72  AST 0 - 40 IU/L 23 34 24  ALT 0 - 44 IU/L _2 2. Hyperlipidemia associated with type 2 diabetes mellitus ([place]) Compliant with medications - Yes Current medications - Crestor Side effects from medications - No Diet - generally healthy Exercise -  limited due to MS  Lab Results  Component Value Date   CHOL 116 04/25/2019   HDL 28 (L) 04/25/2019   LDLCALC 66 04/25/2019   TRIG 119 04/25/2019   CHOLHDL 4.1 04/25/2019     Family and personal medical history reviewed. Smoking and ETOH history reviewed.    3. Diabetes mellitus type 2 in nonobese (HCC) Pt reports compliance with medications. Tries to watch diet. Does not exercise on a regular basis as he has MS. No polyphagia, polydipsia, or polyuria.   4. Aortic atherosclerosis (HCC) On statin and ASA therapy. No chest pain, shortness of breath, palpitations, dizziness, syncope, weight changes, or leg swelling.   5. Vitamin D deficiency Pt is taking oral repletion therapy. Denies bone pain and tenderness, muscle weakness, fracture, and difficulty walking. Lab Results  Component Value Date   VD25OH 44.8 04/25/2019   VD25OH 47.3 12/25/2018   VD25OH 43.3 08/07/2018   Lab Results  Component Value Date   CALCIUM 9.5 04/25/2019      6. Elevated PSA Prior elevated PSA. No LUTS, rectal pressure, scrotal or penile swelling. No hematuria, flank pain, or abdominal pain.   7. Multiple sclerosis ([place]) Followed by neurology on a regular basis. No loss of strength in arms or left leg. Does have right leg weakness. Does wear a brace on RLE. He is able to  get around at home with a cane and walker. He does use a wheelchair when out.   8. Eye lesion Pt reports a lesion on his right eye. Denies injury or pain. States he noticed it a few days ago. No visual disturbances.      Relevant past medical, surgical, family, and social history reviewed and updated as indicated.  Allergies and medications reviewed and updated. Date reviewed: Chart in Epic.   Past Medical History:  Diagnosis Date  . Acquired leg length discrepancy   . Bladder stone   . BPH associated with nocturia   . CAD (coronary artery disease) CARDIOLOGIST- DR HOCHREIN--  LAST VISIT 09-27-2011 IN EPIC   Non obstructive  Cath 2010-  HX CORONARY SPASM 2004  . Cataract    bilateral   . GERD (gastroesophageal reflux disease)   . H/O hiatal hernia   . History of nephrolithiasis 2009  . Hx of adenomatous colonic polyps   . Hyperlipidemia   . Hypertension   . Movement disorder   . Multiple sclerosis ([place]) DX  10/10---  NEUROLOGIST  DR Delfino Lovett [facility] ([place])   RIGHT SIDE AFFECTED MORE W/ WEAKNESS- - USES CANE  . Neuromuscular disorder (HCC)    MS  . NIDDM (non-insulin dependent diabetes mellitus)   . Peyronie's disease   . RBBB   . Renal stone RIGHT  . Right ureteral stone   . Rosacea   . Vision abnormalities     Past Surgical History:  Procedure Laterality Date  . CARDIAC CATHETERIZATION  02-26-2009 ---  DR Ida Rogue   NONOBSTRUCTIVE CAD/ 50% DISTAL RCA/ 30% LAD / NORMAL LVF  . CYSTOSCOPY W/ RETROGRADES  10/30/2011   Procedure: CYSTOSCOPY WITH RETROGRADE PYELOGRAM;  Surgeon: Claybon Jabs, MD;  Location: [facility];  Service: Urology;  Laterality: Right;  . CYSTOSCOPY WITH LITHOLAPAXY  10/30/2011   Procedure: CYSTOSCOPY WITH LITHOLAPAXY;  Surgeon: Claybon Jabs, MD;  [facility];  Service: Urology;  Laterality: N/A;  . EXTRACORPOREAL SHOCK WAVE LITHOTRIPSY  09-05-2010   RIGHT  . EYE SURGERY Right 08/30/2018  . NESBIT PROCEDURE  10-17-2004   CORRECTION OF PENILE ANGULATION (PEYRONIES DISEASE)    Social History   Socioeconomic History  . Marital status: Married    Spouse name: Hassan Rowan  . Number of children: 2  . Years of education: 15  . Highest education level: Some college, no degree  Occupational History  . Occupation: retired    Comment: self -employed farmer  Tobacco Use  . Smoking status: Former Smoker    Packs/day: 1.00    Years: 7.00    Pack years: 7.00    Types: Cigarettes    Quit date: 09/26/1973    Years since quitting: 45.9  . Smokeless tobacco: Current User    Types: Chew  . Tobacco comment: Chewing tobacco  Substance and  Sexual Activity  . Alcohol use: No    Alcohol/week: 0.0 standard drinks  . Drug use: No  . Sexual activity: Not on file  Other Topics Concern  . Not on file  Social History Narrative  . Not on file   Social Determinants of Health   Financial Resource Strain: Low Risk   . Difficulty of Paying Living Expenses: Not hard at all  Food Insecurity: No Food Insecurity  . Worried About Charity fundraiser in the Last Year: Never true  . Ran Out of Food in the Last Year: Never true  Transportation Needs: No  Transportation Needs  . Lack of Transportation (Medical): No  . Lack of Transportation (Non-Medical): No  Physical Activity: Sufficiently Active  . Days of Exercise per Week: 6 days  . Minutes of Exercise per Session: 40 min  Stress: No Stress Concern Present  . Feeling of Stress : Only a little  Social Connections: Slightly Isolated  . Frequency of Communication with Friends and Family: More than three times a week  . Frequency of Social Gatherings with Friends and Family: More than three times a week  . Attends Religious Services: More than 4 times per year  . Active Member of Clubs or Organizations: No  . Attends Archivist Meetings: Never  . Marital Status: Married  Human resources officer Violence: Not At Risk  . Fear of Current or Ex-Partner: No  . Emotionally Abused: No  . Physically Abused: No  . Sexually Abused: No    Outpatient Encounter Medications as of 08/27/2019  Medication Sig  . amLODipine (NORVASC) 2.5 MG tablet Take 1 tablet (2.5 mg total) by mouth every morning.  Marland Kitchen aspirin (LONGS ADULT LOW STRENGTH ASA) 81 MG EC tablet Take 81 mg by mouth daily.   Marland Kitchen BETASERON 0.3 MG KIT injection Inject 0.25 mg into the skin every other day.  . blood glucose meter kit and supplies Dispense based on patient and insurance preference. Check BS TID and PRN  (FOR ICD dx E11.9).  . cholecalciferol (VITAMIN D) 1000 UNITS tablet Take 1,000 Units by mouth daily.  . cyanocobalamin  (,VITAMIN B-12,) 1000 MCG/ML injection INJECT 1ML INTO THE SKIN ONCE MONTH  . desmopressin (DDAVP) 0.1 MG tablet Take 1 tablet (0.1 mg total) by mouth daily.  Marland Kitchen doxycycline (VIBRAMYCIN) 100 MG capsule TAKE ONE CAPSULE BY MOUTH DAILY  . fish oil-omega-3 fatty acids 1000 MG capsule Take 2 g by mouth 2 (two) times daily.  Marland Kitchen glucose blood test strip Use as instructed  . isosorbide mononitrate (IMDUR) 60 MG 24 hr tablet Take 0.5 tablets (30 mg total) by mouth daily.  Marland Kitchen levothyroxine (SYNTHROID) 50 MCG tablet TAKE ONE TABLET BY MOUTH DAILY BEFORE BREAKFAST  . lisinopril (ZESTRIL) 10 MG tablet Take 1 tablet (10 mg total) by mouth daily.  . metFORMIN (GLUCOPHAGE) 500 MG tablet TAKE TWO TABLETS BY MOUTH TWICE DAILY WITH MEALS  . omeprazole (PRILOSEC) 20 MG capsule Take 1 capsule (20 mg total) by mouth daily.  . pioglitazone (ACTOS) 30 MG tablet Take 1 tablet (30 mg total) by mouth daily.  . rosuvastatin (CRESTOR) 20 MG tablet Take 1 tablet (20 mg total) by mouth daily.  . sildenafil (VIAGRA) 100 MG tablet Take 1 tablet (100 mg total) by mouth daily as needed for erectile dysfunction.  . SYRINGE-NEEDLE, DISP, 3 ML (B-D SYRINGE/NEEDLE 3CC/25GX5/8) 25G X 5/8" 3 ML MISC Use to give once a month B12 injections  . tamsulosin (FLOMAX) 0.4 MG CAPS capsule Take 1 capsule (0.4 mg total) by mouth daily after supper.  . [DISCONTINUED] atorvastatin (LIPITOR) 10 MG tablet Take 10 mg by mouth daily.    . [DISCONTINUED] niacin-simvastatin (SIMCOR) 500-20 MG 24 hr tablet Take 1 tablet by mouth at bedtime.    . [DISCONTINUED] predniSONE (DELTASONE) 10 MG tablet Take 5 daily for 2 days followed by 4,3,2 and 1 for 2 days each.  . [DISCONTINUED] valACYclovir (VALTREX) 1000 MG tablet TAKE ONE TABLET (1000 MG TOTAL) BY MOUTH3 TIMES DAILY   Facility-Administered Encounter Medications as of 08/27/2019  Medication  . cyanocobalamin ((VITAMIN B-12)) injection 1,000 mcg  No Known Allergies  Review of Systems    Constitutional: Negative for activity change, appetite change, chills, diaphoresis, fatigue, fever and unexpected weight change.  HENT: Negative.   Eyes: Negative.  Negative for photophobia, pain, discharge, redness, itching and visual disturbance.  Respiratory: Negative for cough, chest tightness and shortness of breath.   Cardiovascular: Negative for chest pain, palpitations and leg swelling.  Gastrointestinal: Negative for abdominal pain, blood in stool, constipation, diarrhea, nausea and vomiting.  Endocrine: Negative.  Negative for cold intolerance, heat intolerance, polydipsia, polyphagia and polyuria.  Genitourinary: Negative for decreased urine volume, difficulty urinating, dysuria, flank pain, frequency, hematuria, penile swelling, scrotal swelling and urgency.  Musculoskeletal: Positive for arthralgias, gait problem and myalgias.  Skin: Negative.   Allergic/Immunologic: Negative.   Neurological: Positive for weakness (RLE). Negative for dizziness, tremors, seizures, syncope, facial asymmetry, speech difficulty, light-headedness, numbness and headaches.  Hematological: Negative.   Psychiatric/Behavioral: Negative for confusion, hallucinations, sleep disturbance and suicidal ideas.  All other systems reviewed and are negative.       Objective:  BP 119/74   Pulse 81   Temp 98.9 F (37.2 C)   Resp 20   Ht 5' 6" (1.676 m)   Wt 160 lb (72.6 kg)   SpO2 98%   BMI 25.82 kg/m    Wt Readings from Last 3 Encounters:  08/27/19 160 lb (72.6 kg)  08/11/19 160 lb 8 oz (72.8 kg)  04/25/19 158 lb (71.7 kg)    Physical Exam Vitals and nursing note reviewed.  Constitutional:      General: He is not in acute distress.    Appearance: Normal appearance. He is well-developed and well-groomed. He is not ill-appearing, toxic-appearing or diaphoretic.  HENT:     Head: Normocephalic and atraumatic.     Jaw: There is normal jaw occlusion.     Right Ear: Hearing normal.     Left Ear:  Hearing normal.     Nose: Nose normal.     Mouth/Throat:     Lips: Pink.     Mouth: Mucous membranes are moist.     Pharynx: Oropharynx is clear. Uvula midline.  Eyes:     General: Lids are normal.        Right eye: No foreign body, discharge or hordeolum.        Left eye: No foreign body, discharge or hordeolum.     Extraocular Movements: Extraocular movements intact.     Right eye: Normal extraocular motion and no nystagmus.     Left eye: Normal extraocular motion and no nystagmus.     Conjunctiva/sclera: Conjunctivae normal.     Right eye: Right conjunctiva is not injected. No chemosis, exudate or hemorrhage.    Pupils: Pupils are equal, round, and reactive to light.   Neck:     Thyroid: No thyroid mass, thyromegaly or thyroid tenderness.     Vascular: No carotid bruit or JVD.     Trachea: Trachea and phonation normal.  Cardiovascular:     Rate and Rhythm: Normal rate and regular rhythm.     Chest Wall: PMI is not displaced.     Pulses: Normal pulses.     Heart sounds: Murmur present. Systolic murmur present with a grade of 2/6. No friction rub. No gallop.   Pulmonary:     Effort: Pulmonary effort is normal. No respiratory distress.     Breath sounds: Normal breath sounds. No wheezing.  Abdominal:     General: Bowel sounds are normal. There is no  distension or abdominal bruit.     Palpations: Abdomen is soft. There is no hepatomegaly or splenomegaly.     Tenderness: There is no abdominal tenderness. There is no right CVA tenderness or left CVA tenderness.     Hernia: No hernia is present.  Musculoskeletal:     Cervical back: Normal range of motion and neck supple.     Right lower leg: No edema.     Left lower leg: No edema.  Lymphadenopathy:     Cervical: No cervical adenopathy.  Skin:    General: Skin is warm and dry.     Capillary Refill: Capillary refill takes less than 2 seconds.     Coloration: Skin is not cyanotic, jaundiced or pale.     Findings: No rash.    Neurological:     General: No focal deficit present.     Mental Status: He is alert and oriented to person, place, and time.     Cranial Nerves: Cranial nerves are intact.     Sensory: Sensation is intact.     Motor: Weakness (RLE weaker than other extremities due to MS) present.     Coordination: Coordination is intact.     Gait: Gait abnormal (MS).     Deep Tendon Reflexes: Reflexes are normal and symmetric.  Psychiatric:        Attention and Perception: Attention and perception normal.        Mood and Affect: Mood and affect normal.        Speech: Speech normal.        Behavior: Behavior normal. Behavior is cooperative.        Thought Content: Thought content normal.        Cognition and Memory: Cognition and memory normal.        Judgment: Judgment normal.     Results for orders placed or performed in visit on 04/25/19  CBC with Differential/Platelet  Result Value Ref Range   WBC 3.9 3.4 - 10.8 x10E3/uL   RBC 4.26 4.14 - 5.80 x10E6/uL   Hemoglobin 11.9 (L) 13.0 - 17.7 g/dL   Hematocrit 36.9 (L) 37.5 - 51.0 %   MCV 87 79 - 97 fL   MCH 27.9 26.6 - 33.0 pg   MCHC 32.2 31.5 - 35.7 g/dL   RDW 15.6 (H) 11.6 - 15.4 %   Platelets 165 150 - 450 x10E3/uL   Neutrophils 73 Not Estab. %   Lymphs 14 Not Estab. %   Monocytes 11 Not Estab. %   Eos 2 Not Estab. %   Basos 0 Not Estab. %   Neutrophils Absolute 2.8 1.4 - 7.0 x10E3/uL   Lymphocytes Absolute 0.5 (L) 0.7 - 3.1 x10E3/uL   Monocytes Absolute 0.4 0.1 - 0.9 x10E3/uL   EOS (ABSOLUTE) 0.1 0.0 - 0.4 x10E3/uL   Basophils Absolute 0.0 0.0 - 0.2 x10E3/uL   Immature Granulocytes 0 Not Estab. %   Immature Grans (Abs) 0.0 0.0 - 0.1 x10E3/uL  CMP14+EGFR  Result Value Ref Range   Glucose 122 (H) 65 - 99 mg/dL   BUN 15 8 - 27 mg/dL   Creatinine, Ser 1.04 0.76 - 1.27 mg/dL   GFR calc non Af Amer 73 >59 mL/min/1.73   GFR calc Af Amer 84 >59 mL/min/1.73   BUN/Creatinine Ratio 14 10 - 24   Sodium 143 134 - 144 mmol/L   Potassium 4.1  3.5 - 5.2 mmol/L   Chloride 106 96 - 106 mmol/L   CO2 23 20 -  29 mmol/L   Calcium 9.5 8.6 - 10.2 mg/dL   Total Protein 6.8 6.0 - 8.5 g/dL   Albumin 4.2 3.8 - 4.8 g/dL   Globulin, Total 2.6 1.5 - 4.5 g/dL   Albumin/Globulin Ratio 1.6 1.2 - 2.2   Bilirubin Total 0.4 0.0 - 1.2 mg/dL   Alkaline Phosphatase 61 39 - 117 IU/L   AST 23 0 - 40 IU/L   ALT 21 0 - 44 IU/L  Lipid panel  Result Value Ref Range   Cholesterol, Total 116 100 - 199 mg/dL   Triglycerides 119 0 - 149 mg/dL   HDL 28 (L) >39 mg/dL   VLDL Cholesterol Cal 22 5 - 40 mg/dL   LDL Chol Calc (NIH) 66 0 - 99 mg/dL   Chol/HDL Ratio 4.1 0.0 - 5.0 ratio  VITAMIN D 25 Hydroxy (Vit-D Deficiency, Fractures)  Result Value Ref Range   Vit D, 25-Hydroxy 44.8 30.0 - 100.0 ng/mL  Bayer DCA Hb A1c Waived  Result Value Ref Range   HB A1C (BAYER DCA - WAIVED) 6.7 <7.0 %  PSA, total and free  Result Value Ref Range   Prostate Specific Ag, Serum 5.0 (H) 0.0 - 4.0 ng/mL   PSA, Free 1.07 N/A ng/mL   PSA, Free Pct 21.4 %       Pertinent labs & imaging results that were available during my care of the patient were reviewed by me and considered in my medical decision making.  Assessment & Plan:  Jacob Fowler was seen today for medical management of chronic issues, hypertension and diabetes.  Diagnoses and all orders for this visit:  Hypertension associated with type 2 diabetes mellitus ([place]) BP well controlled. Changes were not made in regimen. Goal BP is 130/80. Pt aware to report any persistent high or low readings. DASH diet and exercise encouraged. Exercise at least 150 minutes per week and increase as tolerated. Goal BMI > 25. Stress management encouraged. Avoid nicotine and tobacco product use. Avoid excessive alcohol and NSAID's. Avoid more than 2000 mg of sodium daily. Medications as prescribed. Follow up as scheduled.  -     CBC with Differential/Platelet -     CMP14+EGFR  Hyperlipidemia associated with type 2 diabetes mellitus  ([place]) Diet encouraged - increase intake of fresh fruits and vegetables, increase intake of lean proteins. Bake, broil, or grill foods. Avoid fried, greasy, and fatty foods. Avoid fast foods. Increase intake of fiber-rich whole grains. Exercise encouraged - at least 150 minutes per week and advance as tolerated.  Goal BMI < 25. Continue medications as prescribed. Follow up in 3-6 months as discussed.  -     CBC with Differential/Platelet -     Lipid panel  Diabetes mellitus type 2 in nonobese (HCC) A1C 7.8 today. Dietary changes discussed in detail. If next A1C remains above 7, will change medication regimen.  -     CBC with Differential/Platelet -     Bayer DCA Hb A1c Waived  Aortic atherosclerosis (HCC) Continue ASA and statin therapy. Diet and exercise encouraged. Labs pending.  -     CBC with Differential/Platelet -     Lipid panel  Vitamin D deficiency Labs pending. Continue repletion therapy. If indicated, will change repletion dosage. Eat foods rich in Vit D including milk, orange juice, yogurt with vitamin D added, salmon or mackerel, canned tuna fish, cereals with vitamin D added, and cod liver oil. Get out in the sun but make sure to wear at least SPF 30 sunscreen.  -  CBC with Differential/Platelet -     VITAMIN D 25 Hydroxy (Vit-D Deficiency, Fractures)  Elevated PSA Denies LUTS. Will check PSA today.  -     PSA, total and free  Multiple sclerosis ([place]) Uses wheelchair and walker. Does report some days are worse than others. Progressive right leg weakness. He does flow with neurology on a regular basis.   Eye lesion Fluorescein stain did not reveal corneal abrasion or ferning. Lesion concerning for pinguecula, CIN, or pterygium. Pt will follow up with his eye doctor today or tomorrow for further evaluation. Pt aware if he is unable to get in with his eye doctor to call the office and referral will be placed.     Total time spent with patient 40 minutes.  Greater than 50%  of encounter spent in coordination of care/counseling.  Continue all other maintenance medications.  Follow up plan: Return in about 4 months (around 12/25/2019), or if symptoms worsen or fail to improve.  Continue healthy lifestyle choices, including diet (rich in fruits, vegetables, and lean proteins, and low in salt and simple carbohydrates) and exercise (at least 30 minutes of moderate physical activity daily).  Educational handout given for DM  The above assessment and management plan was discussed with the patient. The patient verbalized understanding of and has agreed to the management plan. Patient is aware to call the clinic if they develop any new symptoms or if symptoms persist or worsen. Patient is aware when to return to the clinic for a follow-up visit. Patient educated on when it is appropriate to go to the emergency department.   Monia Pouch, FNP-C [facility] [phone]

## 2019-08-28 ENCOUNTER — Telehealth: Payer: Self-pay | Admitting: *Deleted

## 2019-08-28 LAB — CMP14+EGFR
ALT: 18 IU/L (ref 0–44)
AST: 27 IU/L (ref 0–40)
Albumin/Globulin Ratio: 1.9 (ref 1.2–2.2)
Albumin: 4.5 g/dL (ref 3.8–4.8)
Alkaline Phosphatase: 63 IU/L (ref 39–117)
BUN/Creatinine Ratio: 23 (ref 10–24)
BUN: 26 mg/dL (ref 8–27)
Bilirubin Total: 0.3 mg/dL (ref 0.0–1.2)
CO2: 21 mmol/L (ref 20–29)
Calcium: 9.4 mg/dL (ref 8.6–10.2)
Chloride: 105 mmol/L (ref 96–106)
Creatinine, Ser: 1.13 mg/dL (ref 0.76–1.27)
GFR calc Af Amer: 76 mL/min/{1.73_m2} (ref >59)
GFR calc non Af Amer: 66 mL/min/{1.73_m2} (ref >59)
Globulin, Total: 2.4 g/dL (ref 1.5–4.5)
Glucose: 166 mg/dL — ABNORMAL HIGH (ref 65–99)
Potassium: 4 mmol/L (ref 3.5–5.2)
Sodium: 143 mmol/L (ref 134–144)
Total Protein: 6.9 g/dL (ref 6.0–8.5)

## 2019-08-28 LAB — CBC WITH DIFFERENTIAL/PLATELET
Basophils Absolute: 0 10*3/uL (ref 0.0–0.2)
Basos: 0 %
EOS (ABSOLUTE): 0.1 10*3/uL (ref 0.0–0.4)
Eos: 1 %
Hematocrit: 36.3 % — ABNORMAL LOW (ref 37.5–51.0)
Hemoglobin: 12.1 g/dL — ABNORMAL LOW (ref 13.0–17.7)
Immature Grans (Abs): 0 10*3/uL (ref 0.0–0.1)
Immature Granulocytes: 0 %
Lymphocytes Absolute: 0.4 10*3/uL — ABNORMAL LOW (ref 0.7–3.1)
Lymphs: 8 %
MCH: 29.7 pg (ref 26.6–33.0)
MCHC: 33.3 g/dL (ref 31.5–35.7)
MCV: 89 fL (ref 79–97)
Monocytes Absolute: 0.4 10*3/uL (ref 0.1–0.9)
Monocytes: 9 %
Neutrophils Absolute: 4.2 10*3/uL (ref 1.4–7.0)
Neutrophils: 82 %
Platelets: 166 10*3/uL (ref 150–450)
RBC: 4.08 x10E6/uL — ABNORMAL LOW (ref 4.14–5.80)
RDW: 16.3 % — ABNORMAL HIGH (ref 11.6–15.4)
WBC: 5.1 10*3/uL (ref 3.4–10.8)

## 2019-08-28 LAB — LIPID PANEL
Chol/HDL Ratio: 4.6 ratio (ref 0.0–5.0)
Cholesterol, Total: 130 mg/dL (ref 100–199)
HDL: 28 mg/dL — ABNORMAL LOW (ref >39)
LDL Chol Calc (NIH): 76 mg/dL (ref 0–99)
Triglycerides: 147 mg/dL (ref 0–149)
VLDL Cholesterol Cal: 26 mg/dL (ref 5–40)

## 2019-08-28 LAB — PSA, TOTAL AND FREE
PSA, Free Pct: 23 %
PSA, Free: 1.01 ng/mL
Prostate Specific Ag, Serum: 4.4 ng/mL — ABNORMAL HIGH (ref 0.0–4.0)

## 2019-08-28 LAB — VITAMIN D 25 HYDROXY (VIT D DEFICIENCY, FRACTURES): Vit D, 25-Hydroxy: 32.7 ng/mL (ref 30.0–100.0)

## 2019-08-28 NOTE — Telephone Encounter (Signed)
Received fax from [employer] [place] pt assistance foundation that pt not eligible for program due to pt having low income subsidy. Case number: [id number].

## 2019-09-02 DIAGNOSIS — H11153 Pinguecula, bilateral: Secondary | ICD-10-CM | POA: Diagnosis not present

## 2019-09-09 ENCOUNTER — Telehealth: Payer: Self-pay | Admitting: Neurology

## 2019-09-09 DIAGNOSIS — G35 Multiple sclerosis: Secondary | ICD-10-CM

## 2019-09-09 NOTE — Telephone Encounter (Signed)
Called and spoke with patient. Relayed below information. She will contact social security office about applying for extra help. She will call back if anything further needed.

## 2019-09-09 NOTE — Telephone Encounter (Signed)
I called [facility]. They are not showing him in their system but they have different locations. They recommended I call [facility] to see who is handling his rx. I called [facility] at [phone]. Spoke with Caryl Pina. She is aware he was denied assistance via [place] d/t having low income subsidy and other foundations closed for assistance. She recommended he contact social security office about applying extra help through Medicare part D plan. (low income subsidy).

## 2019-09-09 NOTE — Telephone Encounter (Signed)
Called pt back. He cannot get Betaseron filled d/t cost. Did not qualify for pt assistance via [place] pt assistance. He spoke with insurance today who told him that Betaseron is a tier 5 medication and will charge him 33% of the cost of medication.  He was told to call [facility] pharmacy by [employer] where he received last shipment from. He has not done this. Advised I will call pharmacy to get some more information and call him back.

## 2019-09-09 NOTE — Telephone Encounter (Signed)
Pt called stating he is needing to discuss his BETASERON 0.3 MG KIT injection with RN or provider. Please advise.

## 2019-09-11 MED ORDER — BETASERON 0.3 MG ~~LOC~~ KIT: 0.2500 mg | PACK | SUBCUTANEOUS | 11 refills | Status: DC

## 2019-09-11 NOTE — Addendum Note (Signed)
Addended by: Wyvonnia Lora on: 09/11/2019 04:12 PM   Modules accepted: Orders

## 2019-09-11 NOTE — Telephone Encounter (Signed)
Pt called wanting to speak to RN about the update on this. Please advise.

## 2019-09-11 NOTE — Telephone Encounter (Signed)
Pt called stating the insurance company gave him the pharmacy [facility] number [phone].

## 2019-09-11 NOTE — Telephone Encounter (Signed)
Called pt back. He did a three way call with insurance and [employer]. States he was denied help d/t him having insurance. He requested I send rx betaseron to local pharmacy. They need to know what cost of med will be for him before they can see if he is eligible for assistance. Advised it is specialty med so it will have to go to specialty pharmacy. He will call his insurance to figure out preferred specialty pharmacy and call back to let me know.

## 2019-09-11 NOTE — Telephone Encounter (Signed)
E-scribed rx to [facility].

## 2019-09-22 ENCOUNTER — Other Ambulatory Visit: Payer: Self-pay | Admitting: *Deleted

## 2019-09-22 MED ORDER — DESMOPRESSIN ACETATE 0.1 MG PO TABS: 0.1000 mg | ORAL_TABLET | Freq: Every day | ORAL | 3 refills | Status: DC

## 2019-10-01 ENCOUNTER — Telehealth: Payer: Self-pay | Admitting: Family Medicine

## 2019-10-01 NOTE — Chronic Care Management (AMB) (Signed)
Chronic Care Management   Note  10/01/2019 Name: Jacob Fowler MRN: [id number] DOB: 05/22/1951  Jacob Fowler is a 69 y.o. year old male who is a primary care patient of Rakes, Connye Burkitt, FNP. I reached out to [name] by phone today in response to a referral sent by [name].     [name] was given information about Chronic Care Management services today including:  1. CCM service includes personalized support from designated clinical staff supervised by his physician, including individualized plan of care and coordination with other care providers 2. 24/7 contact phone numbers for assistance for urgent and routine care needs. 3. Service will only be billed when office clinical staff spend 20 minutes or more in a month to coordinate care. 4. Only one practitioner may furnish and bill the service in a calendar month. 5. The patient may stop CCM services at any time (effective at the end of the month) by phone call to the office staff. 6. The patient will be responsible for cost sharing (co-pay) of up to 20% of the service fee (after annual deductible is met).  Patient agreed to services and verbal consent obtained.   Follow up plan: Telephone appointment with care management team member scheduled for: 02/03/2020  Noreene Larsson, [address], [place], [place] [address] Direct Dial: [phone] [email or web address] Website: [email or web address]

## 2019-10-02 ENCOUNTER — Telehealth: Payer: Self-pay | Admitting: Neurology

## 2019-10-02 NOTE — Telephone Encounter (Signed)
Pt has called to find out if RN has heard anything from the [employer] re: his BETASERON 0.3 MG KIT injection, please call 

## 2019-10-02 NOTE — Telephone Encounter (Signed)
Took call from phone staff and spoke with patient. He spoke with [employer] pt assistance who confirmed they received his paperwork. They are forwarding to Contractor. Can take up to 72 hours to review. Advised him to call back Monday or Tuesday to get an update. Advised I will call [employer] to see if they can help and I will call him back.  I called [employer] at [phone]. Spoke with Caryl Pina. They have provided medication prior to pt d/t application being under review for [employer] pt assistance foundation. Can fill another month for him while application is being processed. Needs another Beta bridge supply.    I called pt back. Relayed above info. He will call [employer] to get another months supply of Betaseron while his application is being reviewed for financial assistance.

## 2019-10-02 NOTE — Telephone Encounter (Signed)
Called pt back to get further information. He states elixir is telling him it is going to cost him [phone] for Betaseron. He sent the out of pocket cost to [employer] pt assistance foundation for last year but has not heard back from them. He took his last dose of Betaseron this past Sunday. He is now out of medication. I recommended he call [employer] pt assistance after he gets off the phone with me to get update on where he is at in process. He will call me back with update after he speaks with them. He also states he is not going to fill out application for social security, states they want all his life information and does not want to do this.

## 2019-10-07 NOTE — Telephone Encounter (Signed)
Received fax from [employer] [place] pt assistance foundation that pt has been approved 10/07/2019-07/16/2020. Case nummber: [id number]. He can received Betaseron at no cost via free drug program.

## 2019-11-01 ENCOUNTER — Other Ambulatory Visit: Payer: Self-pay | Admitting: Family Medicine

## 2019-11-04 ENCOUNTER — Encounter: Payer: Self-pay | Admitting: *Deleted

## 2019-11-07 ENCOUNTER — Telehealth: Payer: Self-pay | Admitting: Family Medicine

## 2019-11-07 ENCOUNTER — Other Ambulatory Visit: Payer: Self-pay | Admitting: *Deleted

## 2019-11-07 MED ORDER — ROSUVASTATIN CALCIUM 10 MG PO TABS: 10.0000 mg | ORAL_TABLET | Freq: Every day | ORAL | 0 refills | Status: DC

## 2019-11-07 NOTE — Telephone Encounter (Signed)
Left message , new script for crestor 10 mg sent to pharmacy. May finish taking 20 mg pills (take half pill) also.

## 2019-11-07 NOTE — Telephone Encounter (Signed)
Ok to send Crestor 10mg  [facility] #[phone]

## 2019-11-07 NOTE — Telephone Encounter (Signed)
Patient takes one half pill daily of cholesterol medication.  Vicente Males from [facility] says it looks like he has been non complaint on the records because of directions on script of one 20 mg daily.  Could new script be done for 10 mg pill daily?  I explained that patient has not established with new provider since his pcp has just left.  She preferred message to be sent .

## 2019-11-18 ENCOUNTER — Ambulatory Visit (INDEPENDENT_AMBULATORY_CARE_PROVIDER_SITE_OTHER): Payer: PPO

## 2019-11-18 DIAGNOSIS — Z0000 Encounter for general adult medical examination without abnormal findings: Secondary | ICD-10-CM

## 2019-11-18 NOTE — Progress Notes (Signed)
[name] VISIT  11/18/2019  Telephone Visit Disclaimer This Medicare AWV was conducted by telephone due to national recommendations for restrictions regarding the COVID-19 Pandemic (e.g. social distancing).  I verified, using two identifiers, that I am speaking with Jacob Fowler or their authorized healthcare agent. I discussed the limitations, risks, security, and privacy concerns of performing an evaluation and management service by telephone and the potential availability of an in-person appointment in the future. The patient expressed understanding and agreed to proceed.   Subjective:  Jacob Fowler is a 69 y.o. male patient of Jacob Brooklyn, FNP who had a Medicare Annual Wellness Visit today via telephone. [name] is a very pleasant gentleman who lives in close by [place]. He lives with his wife. They have 2 children together. One son and one daughter. He is a retired Scientist, forensic. He suffers from [place] and this makes things a little more difficult. He has constant pain but takes Tylenol BID to help with that. He has not had his Covid vaccine and is not sure if he wants it. I have mailed him some information on diet and exercise in his AVS.   Patient Care Team: Jacob Brooklyn, FNP as PCP - General (Family Medicine) Sater, Nanine Means, MD (Neurology) Kathie Rhodes, MD as Consulting Physician (Urology) Minus Breeding, MD as Consulting Physician (Cardiology) Ilean China, RN as Registered Nurse  Advanced Directives 11/18/2019 11/12/2018 07/05/2017 10/30/2011 10/25/2011  Does Patient Have a Medical Advance Directive? Yes Yes Yes Patient has advance directive, copy not in chart Patient has advance directive, copy in chart  Type of Advance Directive Living will [place];Living will Living will;Healthcare Power of Attorney Living will Living will  Does patient want to make changes to medical advance directive? No - Patient declined - No - Patient  declined - -  Copy of [place] in Chart? - No - copy requested No - copy requested Copy requested from family -  Pre-existing out of facility DNR order (yellow form or pink MOST form) - - - No -    Hospital Utilization Over the Past 12 Months: # of hospitalizations or ER visits: 0 # of surgeries: 0  Review of Systems    Patient reports that his overall health is unchanged compared to last year.  History obtained from chart review  Patient Reported Readings (BP, Pulse, CBG, Weight, etc) none  Pain Assessment Pain : 0-10 Pain Score: 5  Pain Location: Other (Comment) Pain Descriptors / Indicators: Aching Pain Onset: More than a month ago Pain Frequency: Constant Pain Relieving Factors: Patient has MS. Takes Tylenol BID  Pain Relieving Factors: Patient has MS. Takes Tylenol BID  Current Medications & Allergies (verified) Allergies as of 11/18/2019   No Known Allergies     Medication List       Accurate as of Nov 18, 2019  2:38 PM. If you have any questions, ask your nurse or doctor.        amLODipine 2.5 MG tablet Commonly known as: NORVASC Take 1 tablet (2.5 mg total) by mouth every morning.   Betaseron 0.3 MG Kit injection Generic drug: Interferon Beta-1b Inject 0.25 mg into the skin every other day.   blood glucose meter kit and supplies Dispense based on patient and insurance preference. Check BS TID and PRN  (FOR ICD dx E11.9).   cholecalciferol 1000 units tablet Commonly known as: VITAMIN D Take 1,000 Units by mouth daily.  cyanocobalamin 1000 MCG/ML injection Commonly known as: (VITAMIN B-12) INJECT 1ML INTO THE SKIN ONCE MONTH   desmopressin 0.1 MG tablet Commonly known as: DDAVP Take 1 tablet (0.1 mg total) by mouth daily.   doxycycline 100 MG capsule Commonly known as: VIBRAMYCIN TAKE ONE CAPSULE BY MOUTH DAILY   fish oil-omega-3 fatty acids 1000 MG capsule Take 2 g by mouth 2 (two) times daily.   glucose blood test  strip Use as instructed   isosorbide mononitrate 60 MG 24 hr tablet Commonly known as: IMDUR Take 0.5 tablets (30 mg total) by mouth daily.   levothyroxine 50 MCG tablet Commonly known as: SYNTHROID TAKE ONE TABLET BY MOUTH DAILY BEFORE BREAKFAST   lisinopril 10 MG tablet Commonly known as: ZESTRIL Take 1 tablet (10 mg total) by mouth daily.   Longs Adult Low Strength ASA 81 MG EC tablet Generic drug: aspirin Take 81 mg by mouth daily.   metFORMIN 500 MG tablet Commonly known as: GLUCOPHAGE TAKE TWO TABLETS BY MOUTH TWICE DAILY WITH MEALS   omeprazole 20 MG capsule Commonly known as: PRILOSEC Take 1 capsule (20 mg total) by mouth daily.   pioglitazone 30 MG tablet Commonly known as: ACTOS Take 1 tablet (30 mg total) by mouth daily.   rosuvastatin 10 MG tablet Commonly known as: Crestor Take 1 tablet (10 mg total) by mouth at bedtime.   sildenafil 100 MG tablet Commonly known as: VIAGRA Take 1 tablet (100 mg total) by mouth daily as needed for erectile dysfunction.   SYRINGE-NEEDLE (DISP) 3 ML 25G X 5/8" 3 ML Misc Commonly known as: B-D SYRINGE/NEEDLE 3CC/25GX5/8 Use to give once a month B12 injections   tamsulosin 0.4 MG Caps capsule Commonly known as: FLOMAX Take 1 capsule (0.4 mg total) by mouth daily after supper.       History (reviewed): Past Medical History:  Diagnosis Date  . Acquired leg length discrepancy   . Bladder stone   . BPH associated with nocturia   . CAD (coronary artery disease) CARDIOLOGIST- DR HOCHREIN--  LAST VISIT 09-27-2011 IN EPIC   Non obstructive Cath 2010-  HX CORONARY SPASM 2004  . Cataract    bilateral   . GERD (gastroesophageal reflux disease)   . H/O hiatal hernia   . History of nephrolithiasis 2009  . Hx of adenomatous colonic polyps   . Hyperlipidemia   . Hypertension   . Movement disorder   . Multiple sclerosis ([place]) DX  10/10---  NEUROLOGIST  DR Delfino Lovett [facility] ([place])   RIGHT SIDE AFFECTED MORE W/ WEAKNESS- -  USES CANE  . Neuromuscular disorder (HCC)    MS  . NIDDM (non-insulin dependent diabetes mellitus)   . Peyronie's disease   . RBBB   . Renal stone RIGHT  . Right ureteral stone   . Rosacea   . Vision abnormalities    Past Surgical History:  Procedure Laterality Date  . CARDIAC CATHETERIZATION  02-26-2009 ---  DR Ida Rogue   NONOBSTRUCTIVE CAD/ 50% DISTAL RCA/ 30% LAD / NORMAL LVF  . CYSTOSCOPY W/ RETROGRADES  10/30/2011   Procedure: CYSTOSCOPY WITH RETROGRADE PYELOGRAM;  Surgeon: Claybon Jabs, MD;  Location: [facility];  Service: Urology;  Laterality: Right;  . CYSTOSCOPY WITH LITHOLAPAXY  10/30/2011   Procedure: CYSTOSCOPY WITH LITHOLAPAXY;  Surgeon: Claybon Jabs, MD;  [facility];  Service: Urology;  Laterality: N/A;  . EXTRACORPOREAL SHOCK WAVE LITHOTRIPSY  09-05-2010   RIGHT  . EYE SURGERY Right 08/30/2018  .  NESBIT PROCEDURE  10-17-2004   CORRECTION OF PENILE ANGULATION (PEYRONIES DISEASE)   Family History  Problem Relation Age of Onset  . Stroke Mother   . Heart attack Mother   . Diabetes Mother   . Hypertension Mother   . Renal cancer Father   . Diabetes Sister   . Heart attack Brother   . Diabetes Son   . Heart attack Maternal Grandfather   . Heart attack Brother   . Bladder Cancer Brother    Social History   Socioeconomic History  . Marital status: Married    Spouse name: Hassan Rowan  . Number of children: 2  . Years of education: 43  . Highest education level: Some college, no degree  Occupational History  . Occupation: retired    Comment: self -employed farmer  Tobacco Use  . Smoking status: Former Smoker    Packs/day: 1.00    Years: 7.00    Pack years: 7.00    Types: Cigarettes    Quit date: 09/26/1973    Years since quitting: 46.1  . Smokeless tobacco: Current User    Types: Chew  . Tobacco comment: Chewing tobacco  Substance and Sexual Activity  . Alcohol use: No    Alcohol/week: 0.0 standard drinks   . Drug use: No  . Sexual activity: Not on file  Other Topics Concern  . Not on file  Social History Narrative  . Not on file   Social Determinants of Health   Financial Resource Strain:   . Difficulty of Paying Living Expenses:   Food Insecurity:   . Worried About Charity fundraiser in the Last Year:   . Arboriculturist in the Last Year:   Transportation Needs:   . Film/video editor (Medical):   Marland Kitchen Lack of Transportation (Non-Medical):   Physical Activity:   . Days of Exercise per Week:   . Minutes of Exercise per Session:   Stress:   . Feeling of Stress :   Social Connections:   . Frequency of Communication with Friends and Family:   . Frequency of Social Gatherings with Friends and Family:   . Attends Religious Services:   . Active Member of Clubs or Organizations:   . Attends Archivist Meetings:   Marland Kitchen Marital Status:     Activities of Daily Living In your present state of health, do you have any difficulty performing the following activities: 11/18/2019  Hearing? N  Vision? Y  Comment Wears reading glasses  Difficulty concentrating or making decisions? N  Walking or climbing stairs? Y  Comment Has MS  Dressing or bathing? N  Doing errands, shopping? N  Preparing Food and eating ? N  Using the Toilet? N  In the past six months, have you accidently leaked urine? N  Do you have problems with loss of bowel control? N  Managing your Medications? N  Managing your Finances? N  Housekeeping or managing your Housekeeping? N  Some recent data might be hidden    Patient Education/ Literacy How often do you need to have someone help you when you read instructions, pamphlets, or other written materials from your doctor or pharmacy?: 1 - Never What is the last grade level you completed in school?: 12th grade  Exercise Current Exercise Habits: The patient does not participate in regular exercise at present, Exercise limited by: Other - see comments(Has  MS)  Diet Patient reports consuming 3 meals a day and 2 snack(s) a day Patient reports  that his primary diet is: Regular Patient reports that she does have regular access to food.   Depression Screen PHQ 2/9 Scores 11/18/2019 08/27/2019 04/25/2019 11/12/2018 08/07/2018 03/13/2018 11/26/2017  PHQ - 2 Score 0 0 0 0 0 0 0     Fall Risk Fall Risk  11/18/2019 08/27/2019 04/25/2019 11/12/2018 08/07/2018  Falls in the past year? 1 0 0 1 0  Number falls in past yr: 1 - - 1 -  Injury with Fall? 0 - - 0 -  Risk for fall due to : - - - History of fall(s);Impaired balance/gait;Impaired mobility -  Follow up - - - Education provided;Falls prevention discussed -     Objective:  OVIDE DUSEK seemed alert and oriented and he participated appropriately during our telephone visit.  Blood Pressure Weight BMI  BP Readings from Last 3 Encounters:  08/27/19 119/74  08/11/19 107/61  04/25/19 110/68   Wt Readings from Last 3 Encounters:  08/27/19 160 lb (72.6 kg)  08/11/19 160 lb 8 oz (72.8 kg)  04/25/19 158 lb (71.7 kg)   BMI Readings from Last 1 Encounters:  08/27/19 25.82 kg/m    *Unable to obtain current vital signs, weight, and BMI due to telephone visit type  Hearing/Vision  . [name] did  seem to have difficulty with hearing/understanding during the telephone conversation . Reports that he has not had a formal eye exam by an eye care professional within the past year . Reports that he has not had a formal hearing evaluation within the past year *Unable to fully assess hearing and vision during telephone visit type  Cognitive Function: 6CIT Screen 11/18/2019 11/12/2018  What Year? 0 points 0 points  What month? 0 points 0 points  What time? 0 points 0 points  Count back from 20 0 points 0 points  Months in reverse 0 points 0 points  Repeat phrase 0 points 0 points  Total Score 0 0   (Normal:0-7, Significant for Dysfunction: >8)  Normal Cognitive Function Screening: Yes   Immunization &  Health Maintenance Record Immunization History  Administered Date(s) Administered  . Fluad Quad(high Dose 65+) 04/25/2019  . Influenza, High Dose Seasonal PF 04/10/2016, 05/17/2017, 06/10/2018  . Influenza,inj,Quad PF,6+ Mos 05/14/2013, 05/27/2014, 07/05/2015  . Pneumococcal Conjugate-13 05/27/2013  . Pneumococcal Polysaccharide-23 04/15/2010, 10/04/2017  . Td 02/16/2014  . Tdap 02/16/2014    Health Maintenance  Topic Date Due  . OPHTHALMOLOGY EXAM  08/31/2018  . COVID-19 Vaccine (1) 12/04/2019 (Originally 01/09/1967)  . Hepatitis C Screening  04/24/2020 (Originally 1951-05-22)  . INFLUENZA VACCINE  02/15/2020  . HEMOGLOBIN A1C  02/24/2020  . FOOT EXAM  08/26/2020  . TETANUS/TDAP  02/17/2024  . COLONOSCOPY  01/01/2028  . PNA vac Low Risk Adult  Completed  . COLON CANCER SCREENING ANNUAL FOBT  Discontinued       Assessment  This is a routine wellness examination for KONNOR VONDRASEK.  Health Maintenance: Due or Overdue Health Maintenance Due  Topic Date Due  . OPHTHALMOLOGY EXAM  08/31/2018    Jacob Fowler does not need a referral for Community Assistance: Care Management:   no Social Work:    no Prescription Assistance:  no Nutrition/Diabetes Education:  no   Plan:  Personalized Goals Goals Addressed   None    Personalized Health Maintenance & Screening Recommendations  Discuss Covid vaccine  Lung Cancer Screening Recommended: no (Low Dose CT Chest recommended if Age 70-80 years, 30 pack-year currently smoking OR have quit w/in past  15 years) Hepatitis C Screening recommended: no HIV Screening recommended: no  Advanced Directives: Written information was not prepared per patient's request.  Referrals & Orders No orders of the defined types were placed in this encounter.   Follow-up Plan . Follow-up with Jacob Brooklyn, FNP as planned . Discuss Covid vaccine at next visit    I have personally reviewed and noted the following in the patient's chart:    . Medical and social history . Use of alcohol, tobacco or illicit drugs  . Current medications and supplements . Functional ability and status . Nutritional status . Physical activity . Advanced directives . List of other physicians . Hospitalizations, surgeries, and ER visits in previous 12 months . Vitals . Screenings to include cognitive, depression, and falls . Referrals and appointments  In addition, I have reviewed and discussed with Jacob Fowler certain preventive protocols, quality metrics, and best practice recommendations. A written personalized care plan for preventive services as well as general preventive health recommendations is available and can be mailed to the patient at his request.      Rolena Infante LPN 01/17/855

## 2019-11-18 NOTE — Patient Instructions (Signed)
  Jacob Fowler , Thank you for taking time to come for your Medicare Wellness Visit. I appreciate your ongoing commitment to your health goals. Please review the following plan we discussed and let me know if I can assist you in the future.   These are the goals we discussed: Goals    . DIET - INCREASE WATER INTAKE     Increase water intake to 8 glasses per day - 64 oz.     . Exercise 3x per week (30 min per time)     Pt would like to "get around better"        This is a list of the screening recommended for you and due dates:  Health Maintenance  Topic Date Due  . Eye exam for diabetics  08/31/2018  . COVID-19 Vaccine (1) 12/04/2019*  .  Hepatitis C: One time screening is recommended by Center for Disease Control  (CDC) for  adults born from [birth year] through 1966.   04/24/2020*  . Flu Shot  02/15/2020  . Hemoglobin A1C  02/24/2020  . Complete foot exam   08/26/2020  . Tetanus Vaccine  02/17/2024  . Colon Cancer Screening  01/01/2028  . Pneumonia vaccines  Completed  . Stool Blood Test  Discontinued  *Topic was postponed. The date shown is not the original due date.

## 2019-12-06 ENCOUNTER — Other Ambulatory Visit: Payer: Self-pay | Admitting: Family Medicine

## 2019-12-08 NOTE — Addendum Note (Signed)
Addended by: Antonietta Barcelona D on: 12/08/2019 09:39 AM   Modules accepted: Orders

## 2019-12-25 ENCOUNTER — Ambulatory Visit (INDEPENDENT_AMBULATORY_CARE_PROVIDER_SITE_OTHER): Payer: PPO | Admitting: Family Medicine

## 2019-12-25 ENCOUNTER — Other Ambulatory Visit: Payer: Self-pay

## 2019-12-25 ENCOUNTER — Ambulatory Visit: Payer: PPO | Admitting: Family Medicine

## 2019-12-25 ENCOUNTER — Encounter: Payer: Self-pay | Admitting: Family Medicine

## 2019-12-25 VITALS — BP 110/68 | HR 75 | Temp 98.0°F

## 2019-12-25 DIAGNOSIS — E785 Hyperlipidemia, unspecified: Secondary | ICD-10-CM | POA: Diagnosis not present

## 2019-12-25 DIAGNOSIS — R972 Elevated prostate specific antigen [PSA]: Secondary | ICD-10-CM

## 2019-12-25 DIAGNOSIS — E039 Hypothyroidism, unspecified: Secondary | ICD-10-CM | POA: Diagnosis not present

## 2019-12-25 DIAGNOSIS — E559 Vitamin D deficiency, unspecified: Secondary | ICD-10-CM

## 2019-12-25 DIAGNOSIS — E119 Type 2 diabetes mellitus without complications: Secondary | ICD-10-CM

## 2019-12-25 DIAGNOSIS — E1169 Type 2 diabetes mellitus with other specified complication: Secondary | ICD-10-CM | POA: Diagnosis not present

## 2019-12-25 DIAGNOSIS — I10 Essential (primary) hypertension: Secondary | ICD-10-CM | POA: Diagnosis not present

## 2019-12-25 DIAGNOSIS — K219 Gastro-esophageal reflux disease without esophagitis: Secondary | ICD-10-CM | POA: Diagnosis not present

## 2019-12-25 DIAGNOSIS — I700 Atherosclerosis of aorta: Secondary | ICD-10-CM | POA: Diagnosis not present

## 2019-12-25 DIAGNOSIS — D696 Thrombocytopenia, unspecified: Secondary | ICD-10-CM | POA: Diagnosis not present

## 2019-12-25 DIAGNOSIS — L719 Rosacea, unspecified: Secondary | ICD-10-CM | POA: Diagnosis not present

## 2019-12-25 DIAGNOSIS — E1159 Type 2 diabetes mellitus with other circulatory complications: Secondary | ICD-10-CM | POA: Diagnosis not present

## 2019-12-25 DIAGNOSIS — I152 Hypertension secondary to endocrine disorders: Secondary | ICD-10-CM

## 2019-12-25 LAB — BAYER DCA HB A1C WAIVED: HB A1C (BAYER DCA - WAIVED): 6.7 % (ref <7.0)

## 2019-12-25 MED ORDER — TAMSULOSIN HCL 0.4 MG PO CAPS: 0.4000 mg | ORAL_CAPSULE | Freq: Every day | ORAL | 1 refills | Status: DC

## 2019-12-25 MED ORDER — AMLODIPINE BESYLATE 2.5 MG PO TABS: 2.5000 mg | ORAL_TABLET | Freq: Every morning | ORAL | 1 refills | Status: DC

## 2019-12-25 MED ORDER — PIOGLITAZONE HCL 30 MG PO TABS: 30.0000 mg | ORAL_TABLET | Freq: Every day | ORAL | 1 refills | Status: DC

## 2019-12-25 MED ORDER — ROSUVASTATIN CALCIUM 10 MG PO TABS: 10.0000 mg | ORAL_TABLET | Freq: Every day | ORAL | 1 refills | Status: DC

## 2019-12-25 MED ORDER — METFORMIN HCL 1000 MG PO TABS: 1000.0000 mg | ORAL_TABLET | Freq: Two times a day (BID) | ORAL | 1 refills | Status: DC

## 2019-12-25 MED ORDER — LISINOPRIL 10 MG PO TABS: 10.0000 mg | ORAL_TABLET | Freq: Every day | ORAL | 1 refills | Status: DC

## 2019-12-25 MED ORDER — ISOSORBIDE MONONITRATE ER 60 MG PO TB24: 30.0000 mg | ORAL_TABLET | Freq: Every day | ORAL | 1 refills | Status: DC

## 2019-12-25 MED ORDER — LEVOTHYROXINE SODIUM 50 MCG PO TABS: ORAL_TABLET | ORAL | 1 refills | Status: DC

## 2019-12-25 MED ORDER — OMEPRAZOLE 20 MG PO CPDR: 20.0000 mg | DELAYED_RELEASE_CAPSULE | Freq: Every day | ORAL | 1 refills | Status: DC

## 2019-12-25 NOTE — Progress Notes (Signed)
Assessment & Plan:  1. Diabetes mellitus type 2 in nonobese [facility]) Lab Results  Component Value Date   HGBA1C 6.7 12/25/2019   HGBA1C 7.8 (H) 08/27/2019   HGBA1C 6.7 04/25/2019  - Diabetes is at goal of A1c < 7. - Medications: continue current medications - Home glucose monitoring: continue monitoring - Patient is currently taking a statin. Patient is taking an ACE-inhibitor/ARB.  - Last foot exam: 08/27/2019 - Last diabetic eye exam: recent - record request signed for [facility] in [place].  - Urine Microalbumin/Creat Ratio: 2018 - lisinopril (ZESTRIL) 10 MG tablet; Take 1 tablet (10 mg total) by mouth daily.  Dispense: 90 tablet; Refill: 1 - rosuvastatin (CRESTOR) 10 MG tablet; Take 1 tablet (10 mg total) by mouth at bedtime.  Dispense: 90 tablet; Refill: 1 - pioglitazone (ACTOS) 30 MG tablet; Take 1 tablet (30 mg total) by mouth daily.  Dispense: 90 tablet; Refill: 1 - metFORMIN (GLUCOPHAGE) 1000 MG tablet; Take 1 tablet (1,000 mg total) by mouth 2 (two) times daily with a meal.  Dispense: 180 tablet; Refill: 1 - CMP14+EGFR - Bayer DCA Hb A1c Waived  2. Hypertension associated with type 2 diabetes mellitus ([place]) - Well controlled on current regimen.  - amLODipine (NORVASC) 2.5 MG tablet; Take 1 tablet (2.5 mg total) by mouth every morning.  Dispense: 90 tablet; Refill: 1 - isosorbide mononitrate (IMDUR) 60 MG 24 hr tablet; Take 0.5 tablets (30 mg total) by mouth daily.  Dispense: 45 tablet; Refill: 1 - lisinopril (ZESTRIL) 10 MG tablet; Take 1 tablet (10 mg total) by mouth daily.  Dispense: 90 tablet; Refill: 1  3. Hyperlipidemia associated with type 2 diabetes mellitus ([place]) - Well controlled on current regimen.  - rosuvastatin (CRESTOR) 10 MG tablet; Take 1 tablet (10 mg total) by mouth at bedtime.  Dispense: 90 tablet; Refill: 1  4. Aortic atherosclerosis ([place]) - Well controlled on current regimen. Continue statin and ASA.  - rosuvastatin (CRESTOR) 10 MG tablet; Take 1  tablet (10 mg total) by mouth at bedtime.  Dispense: 90 tablet; Refill: 1  5. Rosacea - Well controlled on current regimen.   6. Vitamin D deficiency - Well controlled on current regimen.   7. Thrombocytopenia ([place]) - Platelets WNL in February.   8. Elevated PSA - PSA, total and free  9. Gastroesophageal reflux disease, unspecified whether esophagitis present - Well controlled on current regimen.  - omeprazole (PRILOSEC) 20 MG capsule; Take 1 capsule (20 mg total) by mouth daily.  Dispense: 90 capsule; Refill: 1  10. Hypothyroidism, unspecified type - Well controlled on current regimen.  - levothyroxine (SYNTHROID) 50 MCG tablet; TAKE ONE TABLET BY MOUTH DAILY BEFORE BREAKFAST  Dispense: 90 tablet; Refill: 1 - Thyroid Panel With TSH   Return in 6 months (on 06/25/2020) for follow-up of chronic medication conditions.  Hendricks Limes, MSN, APRN, FNP-C [facility]  Subjective:    [name], male    DOB: 1951-06-25, 69 y.o.   MRN: [id number]  Patient Care Team: [name], FNP as PCP - General (Family Medicine) Sater, Nanine Means, MD (Neurology) Kathie Rhodes, MD as Consulting Physician (Urology) Minus Breeding, MD as Consulting Physician (Cardiology) Ilean China, RN as Registered Nurse   Chief Complaint:  Chief Complaint  Patient presents with  . Establish Care    rakes pt  . Diabetes    check up of chronic medical conditions    HPI: JELAN BATTERTON is a 69 y.o.  male presenting on 12/25/2019 for Establish Care (rakes pt) and Diabetes (check up of chronic medical conditions)  Diabetes: Patient presents for follow up of diabetes. Current symptoms include: none. Known diabetic complications: none. Medication compliance: yes. Current diet: in general, a "healthy" diet  . Current exercise: none. Home blood sugar records: 130s fasting on average. Is he  on ACE inhibitor or angiotensin II receptor blocker? Yes. Is he on a statin?  Yes.   Last A1c 7.8 on 08/27/2019. A1c 6.7 today.   Lab Results  Component Value Date   MICROALBUR negative 02/19/2015   LDLCALC 76 08/27/2019   CREATININE 1.01 12/25/2019     Patient takes doxycycline every other day for rosacea.   New complaints: None  Social history:  Relevant past medical, surgical, family and social history reviewed and updated as indicated. Interim medical history since our last visit reviewed.  Allergies and medications reviewed and updated.  DATA REVIEWED: CHART IN EPIC  ROS: Negative unless specifically indicated above in HPI.    Current Outpatient Medications:  .  amLODipine (NORVASC) 2.5 MG tablet, Take 1 tablet (2.5 mg total) by mouth every morning., Disp: 90 tablet, Rfl: 3 .  aspirin (LONGS ADULT LOW STRENGTH ASA) 81 MG EC tablet, Take 81 mg by mouth daily. , Disp: , Rfl:  .  BETASERON 0.3 MG KIT injection, Inject 0.25 mg into the skin every other day., Disp: 1 kit, Rfl: 11 .  blood glucose meter kit and supplies, Dispense based on patient and insurance preference. Check BS TID and PRN  (FOR ICD dx E11.9)., Disp: 1 each, Rfl: 1 .  cholecalciferol (VITAMIN D) 1000 UNITS tablet, Take 1,000 Units by mouth daily., Disp: , Rfl:  .  cyanocobalamin (,VITAMIN B-12,) 1000 MCG/ML injection, INJECT 1ML INTO THE SKIN ONCE MONTH, Disp: 3 mL, Rfl: 3 .  desmopressin (DDAVP) 0.1 MG tablet, Take 1 tablet (0.1 mg total) by mouth daily., Disp: 90 tablet, Rfl: 3 .  doxycycline (VIBRAMYCIN) 100 MG capsule, TAKE ONE CAPSULE BY MOUTH DAILY, Disp: 90 capsule, Rfl: 1 .  fish oil-omega-3 fatty acids 1000 MG capsule, Take 2 g by mouth 2 (two) times daily., Disp: , Rfl:  .  glucose blood test strip, Use as instructed, Disp: 100 each, Rfl: 12 .  isosorbide mononitrate (IMDUR) 60 MG 24 hr tablet, Take 0.5 tablets (30 mg total) by mouth daily., Disp: 45 tablet, Rfl: 3 .  levothyroxine (SYNTHROID) 50 MCG tablet, TAKE ONE TABLET BY MOUTH DAILY BEFORE BREAKFAST, Disp: 90 tablet,  Rfl: 3 .  lisinopril (ZESTRIL) 10 MG tablet, Take 1 tablet (10 mg total) by mouth daily., Disp: 90 tablet, Rfl: 3 .  metFORMIN (GLUCOPHAGE) 1000 MG tablet, Take 1,000 mg by mouth 2 (two) times daily with a meal., Disp: , Rfl:  .  omeprazole (PRILOSEC) 20 MG capsule, Take 1 capsule (20 mg total) by mouth daily., Disp: 90 capsule, Rfl: 3 .  pioglitazone (ACTOS) 30 MG tablet, Take 1 tablet (30 mg total) by mouth daily., Disp: 90 tablet, Rfl: 3 .  rosuvastatin (CRESTOR) 10 MG tablet, Take 1 tablet (10 mg total) by mouth at bedtime., Disp: 90 tablet, Rfl: 0 .  sildenafil (VIAGRA) 100 MG tablet, Take 1 tablet (100 mg total) by mouth daily as needed for erectile dysfunction., Disp: 12 tablet, Rfl: 2 .  SYRINGE-NEEDLE, DISP, 3 ML (B-D SYRINGE/NEEDLE 3CC/25GX5/8) 25G X 5/8" 3 ML MISC, Use to give once a month B12 injections, Disp: 12 each, Rfl: 0 .  tamsulosin (FLOMAX)  0.4 MG CAPS capsule, Take 1 capsule (0.4 mg total) by mouth daily after supper., Disp: 90 capsule, Rfl: 3  Current Facility-Administered Medications:  .  cyanocobalamin ((VITAMIN B-12)) injection 1,000 mcg, 1,000 mcg, Intramuscular, Q30 days, Chipper Herb, MD, 1,000 mcg at 03/02/15 1647   No Known Allergies Past Medical History:  Diagnosis Date  . Acquired leg length discrepancy   . Bladder stone   . BPH associated with nocturia   . CAD (coronary artery disease) CARDIOLOGIST- DR HOCHREIN--  LAST VISIT 09-27-2011 IN EPIC   Non obstructive Cath 2010-  HX CORONARY SPASM 2004  . Cataract    bilateral   . GERD (gastroesophageal reflux disease)   . H/O hiatal hernia   . History of nephrolithiasis 2009  . Hx of adenomatous colonic polyps   . Hyperlipidemia   . Hypertension   . Movement disorder   . Multiple sclerosis ([place]) DX  10/10---  NEUROLOGIST  DR Delfino Lovett [place] ([place])   RIGHT SIDE AFFECTED MORE W/ WEAKNESS- - USES CANE  . Neuromuscular disorder ([facility])    MS  . NIDDM (non-insulin dependent diabetes mellitus)   .  Peyronie's disease   . RBBB   . Renal stone RIGHT  . Rosacea   . Vision abnormalities     Past Surgical History:  Procedure Laterality Date  . CARDIAC CATHETERIZATION  02-26-2009 ---  DR Ida Rogue   NONOBSTRUCTIVE CAD/ 50% DISTAL RCA/ 30% LAD / NORMAL LVF  . CYSTOSCOPY W/ RETROGRADES  10/30/2011   Procedure: CYSTOSCOPY WITH RETROGRADE PYELOGRAM;  Surgeon: Claybon Jabs, MD;  Location: [facility];  Service: Urology;  Laterality: Right;  . CYSTOSCOPY WITH LITHOLAPAXY  10/30/2011   Procedure: CYSTOSCOPY WITH LITHOLAPAXY;  Surgeon: Claybon Jabs, MD;  Location: [facility];  Service: Urology;  Laterality: N/A;  . EXTRACORPOREAL SHOCK WAVE LITHOTRIPSY  09-05-2010   RIGHT  . EYE SURGERY Right 08/30/2018  . NESBIT PROCEDURE  10-17-2004   CORRECTION OF PENILE ANGULATION (PEYRONIES DISEASE)    Social History   Socioeconomic History  . Marital status: Married    Spouse name: Hassan Rowan  . Number of children: 2  . Years of education: 65  . Highest education level: Some college, no degree  Occupational History  . Occupation: retired    Comment: self -employed farmer  Tobacco Use  . Smoking status: Former Smoker    Packs/day: 1.00    Years: 7.00    Pack years: 7.00    Types: Cigarettes    Quit date: 09/26/1973    Years since quitting: 46.2  . Smokeless tobacco: Current User    Types: Chew  . Tobacco comment: Chewing tobacco  Vaping Use  . Vaping Use: Never used  Substance and Sexual Activity  . Alcohol use: No    Alcohol/week: 0.0 standard drinks  . Drug use: No  . Sexual activity: Not on file  Other Topics Concern  . Not on file  Social History Narrative  . Not on file   Social Determinants of Health   Financial Resource Strain:   . Difficulty of Paying Living Expenses:   Food Insecurity:   . Worried About Charity fundraiser in the Last Year:   . Arboriculturist in the Last Year:   Transportation Needs:   . Film/video editor  (Medical):   Marland Kitchen Lack of Transportation (Non-Medical):   Physical Activity:   . Days of Exercise per Week:   . Minutes of  Exercise per Session:   Stress:   . Feeling of Stress :   Social Connections:   . Frequency of Communication with Friends and Family:   . Frequency of Social Gatherings with Friends and Family:   . Attends Religious Services:   . Active Member of Clubs or Organizations:   . Attends Archivist Meetings:   Marland Kitchen Marital Status:   Intimate Partner Violence:   . Fear of Current or Ex-Partner:   . Emotionally Abused:   Marland Kitchen Physically Abused:   . Sexually Abused:         Objective:    BP 110/68   Pulse 75   Temp 98 F (36.7 C) (Temporal)   SpO2 96%   Wt Readings from Last 3 Encounters:  08/27/19 160 lb (72.6 kg)  08/11/19 160 lb 8 oz (72.8 kg)  04/25/19 158 lb (71.7 kg)    Physical Exam Vitals reviewed.  Constitutional:      General: He is not in acute distress.    Appearance: Normal appearance. He is not ill-appearing, toxic-appearing or diaphoretic.  HENT:     Head: Normocephalic and atraumatic.  Eyes:     General: No scleral icterus.       Right eye: No discharge.        Left eye: No discharge.     Conjunctiva/sclera: Conjunctivae normal.  Cardiovascular:     Rate and Rhythm: Normal rate and regular rhythm.     Heart sounds: Normal heart sounds. No murmur heard.  No friction rub. No gallop.   Pulmonary:     Effort: Pulmonary effort is normal. No respiratory distress.     Breath sounds: Normal breath sounds. No stridor. No wheezing, rhonchi or rales.  Musculoskeletal:        General: Normal range of motion.     Cervical back: Normal range of motion.  Skin:    General: Skin is warm and dry.  Neurological:     Mental Status: He is alert and oriented to person, place, and time. Mental status is at baseline.     Gait: Gait abnormal (ambulates with rolling walker).  Psychiatric:        Mood and Affect: Mood normal.        Behavior:  Behavior normal.        Thought Content: Thought content normal.        Judgment: Judgment normal.     Lab Results  Component Value Date   TSH 2.600 12/25/2019   Lab Results  Component Value Date   WBC WILL FOLLOW 12/25/2019   HGB WILL FOLLOW 12/25/2019   HCT WILL FOLLOW 12/25/2019   MCV WILL FOLLOW 12/25/2019   PLT WILL FOLLOW 12/25/2019   Lab Results  Component Value Date   NA 141 12/25/2019   K 4.0 12/25/2019   CO2 19 (L) 12/25/2019   GLUCOSE 142 (H) 12/25/2019   BUN 19 12/25/2019   CREATININE 1.01 12/25/2019   BILITOT 0.3 12/25/2019   ALKPHOS 57 12/25/2019   AST 27 12/25/2019   ALT 21 12/25/2019   PROT 6.8 12/25/2019   ALBUMIN 4.3 12/25/2019   CALCIUM 9.1 12/25/2019   Lab Results  Component Value Date   CHOL 130 08/27/2019   Lab Results  Component Value Date   HDL 28 (L) 08/27/2019   Lab Results  Component Value Date   LDLCALC 76 08/27/2019   Lab Results  Component Value Date   TRIG 147 08/27/2019   Lab Results  Component  Value Date   CHOLHDL 4.6 08/27/2019   Lab Results  Component Value Date   HGBA1C 6.7 12/25/2019

## 2019-12-26 LAB — THYROID PANEL WITH TSH
Free Thyroxine Index: 2.5 (ref 1.2–4.9)
T3 Uptake Ratio: 26 % (ref 24–39)
T4, Total: 9.6 ug/dL (ref 4.5–12.0)
TSH: 2.6 u[IU]/mL (ref 0.450–4.500)

## 2019-12-26 LAB — SPECIMEN STATUS

## 2019-12-26 LAB — CMP14+EGFR
ALT: 21 IU/L (ref 0–44)
AST: 27 IU/L (ref 0–40)
Albumin/Globulin Ratio: 1.7 (ref 1.2–2.2)
Albumin: 4.3 g/dL (ref 3.8–4.8)
Alkaline Phosphatase: 57 IU/L (ref 48–121)
BUN/Creatinine Ratio: 19 (ref 10–24)
BUN: 19 mg/dL (ref 8–27)
Bilirubin Total: 0.3 mg/dL (ref 0.0–1.2)
CO2: 19 mmol/L — ABNORMAL LOW (ref 20–29)
Calcium: 9.1 mg/dL (ref 8.6–10.2)
Chloride: 109 mmol/L — ABNORMAL HIGH (ref 96–106)
Creatinine, Ser: 1.01 mg/dL (ref 0.76–1.27)
GFR calc Af Amer: 87 mL/min/{1.73_m2} (ref >59)
GFR calc non Af Amer: 76 mL/min/{1.73_m2} (ref >59)
Globulin, Total: 2.5 g/dL (ref 1.5–4.5)
Glucose: 142 mg/dL — ABNORMAL HIGH (ref 65–99)
Potassium: 4 mmol/L (ref 3.5–5.2)
Sodium: 141 mmol/L (ref 134–144)
Total Protein: 6.8 g/dL (ref 6.0–8.5)

## 2019-12-26 LAB — PSA, TOTAL AND FREE
PSA, Free Pct: 23.5 %
PSA, Free: 1.13 ng/mL
Prostate Specific Ag, Serum: 4.8 ng/mL — ABNORMAL HIGH (ref 0.0–4.0)

## 2019-12-26 LAB — SPECIMEN STATUS REPORT

## 2019-12-29 ENCOUNTER — Ambulatory Visit: Payer: PPO | Admitting: Family Medicine

## 2019-12-30 ENCOUNTER — Telehealth: Payer: Self-pay | Admitting: Family Medicine

## 2019-12-30 NOTE — Progress Notes (Signed)
I am okay with it not being ran.

## 2019-12-30 NOTE — Telephone Encounter (Signed)
Patient aware of results, see documentation in result notes.

## 2019-12-30 NOTE — Progress Notes (Unsigned)
Sent add on to lab for CBC but they are unable to add this on.  If you need this, patient will have to come back in for lab work.

## 2019-12-30 NOTE — Telephone Encounter (Signed)
Pt returning call to the nurse for lab results.  

## 2020-01-01 ENCOUNTER — Other Ambulatory Visit: Payer: Self-pay | Admitting: Family Medicine

## 2020-01-01 DIAGNOSIS — R972 Elevated prostate specific antigen [PSA]: Secondary | ICD-10-CM

## 2020-02-02 ENCOUNTER — Other Ambulatory Visit: Payer: Self-pay | Admitting: *Deleted

## 2020-02-02 DIAGNOSIS — L03116 Cellulitis of left lower limb: Secondary | ICD-10-CM

## 2020-02-03 ENCOUNTER — Ambulatory Visit: Payer: PPO | Admitting: *Deleted

## 2020-02-03 DIAGNOSIS — E119 Type 2 diabetes mellitus without complications: Secondary | ICD-10-CM

## 2020-02-03 DIAGNOSIS — E1169 Type 2 diabetes mellitus with other specified complication: Secondary | ICD-10-CM

## 2020-02-03 DIAGNOSIS — E1159 Type 2 diabetes mellitus with other circulatory complications: Secondary | ICD-10-CM

## 2020-02-03 NOTE — Chronic Care Management (AMB) (Signed)
  Chronic Care Management   Initial Visit Outreach  02/03/2020 Name: Jacob Fowler MRN: [id number] DOB: [birth year]  Referred by: [name], FNP Reason for referral : Chronic Care Management (Initial Visit)   An unsuccessful Initial Telephone Visit was attempted today. The patient was referred to the case management team for assistance with care management and care coordination.   Follow Up Plan: A HIPPA compliant phone message was left for the patient providing contact information and requesting a return call.  The care management team will reach out to the patient again over the next 10 days.   Chong Sicilian, BSN, RN-BC Embedded Chronic Care Manager [facility] Direct Dial: [phone]

## 2020-02-04 ENCOUNTER — Telehealth: Payer: Self-pay | Admitting: Family Medicine

## 2020-02-04 NOTE — Chronic Care Management (AMB) (Signed)
  Care Management   Note  02/04/2020 Name: Jacob Fowler MRN: [id number] DOB: 1950/12/06  Jacob Fowler is a 69 y.o. year old male who is a primary care patient of [name], FNP and is actively engaged with the care management team. I reached out to [name] by phone today to assist with re-scheduling an initial visit with the RN Case Manager  Follow up plan: Telephone appointment with care management team member scheduled for:03/16/2020  Noreene Larsson, [address], [place], [place] [id number] Direct Dial: [phone] [email or web address]: [email or web address]

## 2020-02-09 ENCOUNTER — Encounter: Payer: Self-pay | Admitting: Neurology

## 2020-02-09 ENCOUNTER — Other Ambulatory Visit: Payer: Self-pay

## 2020-02-09 ENCOUNTER — Ambulatory Visit: Payer: PPO | Admitting: Neurology

## 2020-02-09 VITALS — BP 121/75 | HR 73 | Ht 66.0 in | Wt 161.5 lb

## 2020-02-09 DIAGNOSIS — M21371 Foot drop, right foot: Secondary | ICD-10-CM | POA: Diagnosis not present

## 2020-02-09 DIAGNOSIS — R5383 Other fatigue: Secondary | ICD-10-CM | POA: Diagnosis not present

## 2020-02-09 DIAGNOSIS — G35 Multiple sclerosis: Secondary | ICD-10-CM

## 2020-02-09 DIAGNOSIS — R39198 Other difficulties with micturition: Secondary | ICD-10-CM | POA: Diagnosis not present

## 2020-02-09 DIAGNOSIS — R296 Repeated falls: Secondary | ICD-10-CM

## 2020-02-09 DIAGNOSIS — R261 Paralytic gait: Secondary | ICD-10-CM | POA: Diagnosis not present

## 2020-02-09 MED ORDER — DESMOPRESSIN ACETATE 0.2 MG PO TABS: 0.2000 mg | ORAL_TABLET | Freq: Every day | ORAL | 3 refills | Status: DC

## 2020-02-09 NOTE — Progress Notes (Signed)
[facility]  PATIENT: Jacob Fowler DOB: 06/27/1951  REFERRING DOCTOR OR PCP:  Redge Gainer  _________________________________   HISTORICAL  CHIEF COMPLAINT:  Chief Complaint  Patient presents with   Follow-up    RM 12 with wife. Last seen 08/11/2019.    Multiple Sclerosis    On Betaseron, tolerating well.    Gait Problem    Uses cane/walker. No falls since last seen.     HISTORY OF PRESENT ILLNESS:  Jacob Fowler is a 69 year old man with multiple sclerosis.      Update 02/09/2020: He feels he is slowly progressive.     On 12/26/2019 while rushing to get to the bathroom, he fell and than had incontinence.  He felt weaker that day in general but was back to baseline.   His right leg is weak ans has some spasticity.   He wears the AFO during the day.   He uses a walker in the house.  Ampyra and amantadine have not helped his gait.  He has urinary frequency,  urgency and urinary hesitancy. Tamsulosin helps.  Oxybutynin did not help much and Myrbetriq was too expensive  Now he needs to urinate q 2-3 hours during the day and  has nocturia 2-3 times nightly, sometimes more.   He has received a benefit from desmopressin 0.1 mg nightly  and last sodium was fine at 141.  He is on Betaseron.    He tolerates it well.   We had discussed other med's in the past.  MRI in 2020 showed no progression compared to 2018 so we opted to continue Betaseron.  He has 2 spinal cord plaques.    We had a conversation about the Covid 19 vaccination.  I recommend that he get the vaccination.  He is reluctant to do so.  I discussed that the risk of the vaccination is small lower than the risk he would face if he cord Covid.   MS History:   He began to note significant difficulty with gait in January 2010.      He had some back x-rays. He was then referred to neurosurgery that noted that he had plaques in his spine c/w  MS. He had further testing including MRI of the brain and spinal  tap. These were all consistent with multiple sclerosis and by November 2010 he had started Betaseron. He feels that he has been mostly stable over the past 5 years but has noted that his gait is a little worse than it was in 2010. About 2013, he started using a cane.  IMAGING MRI Images  09/04/16 :  This MRI of the cervical spine with and without contrast shows the following: 1.    T2 hyperintense lesions within the spinal cord to the left adjacent to C4-C5 and to the right adjacent to T1, unchanged in appearance when compared to the previous MRI.   2.    Mild degenerative changes at C3-C4, C4-C5 and C6-C7 that did not lead to any nerve root compression. 3.    There is a normal enhancement pattern and there are no acute findings.  08/25/2016 MRI of the brain with and without contrast shows the following: 1.    Multiple T2/FLAIR hyperintense foci in the periventricular, juxtacortical and deep white matter of the hemispheres and 2 foci in the brainstem in a pattern and configuration consistent with chronic demyelinating plaque associated with multiple sclerosis. None of the foci appears to be acute. When compared to the MRI  dated 09/28/2014, there is no interval change. 2.   Mild to moderate cortical atrophy that has mildly progressed when compared to the previous MRI. 3.    There are no acute findings and there is a normal enhancement pattern.  MRI 03/31/2019 brain and cervical spine shows " T2 hyperintense lesions within the spinal cord to the left adjacent to C4-C5 and to the right adjacent to T1, unchanged in appearance when compared to the previous MRI."   MRI brain showed no change in MS lesions.   REVIEW OF SYSTEMS: Constitutional: No fevers, chills, sweats, or change in appetite.  He notes fatigue most afternoon.  He has occasional insomnia. Eyes: No visual changes, double vision, eye pain Ear, nose and throat: No hearing loss, ear pain, nasal congestion, sore throat Cardiovascular: No chest  pain, palpitations Respiratory: No shortness of breath at rest or with exertion.   No wheezes GastrointestinaI: No nausea, vomiting, diarrhea, abdominal pain, fecal incontinence Genitourinary: urinary frequency and hesitancy  .  He has nocturia.   Musculoskeletal: No neck pain, back pain Integumentary: He denies rash or skin lesions. Neurological: as above Psychiatric: No depression at this time.  No anxiety Endocrine: No palpitations, diaphoresis, change in appetite, change in weigh or increased thirst Hematologic/Lymphatic: No anemia, purpura, petechiae. Allergic/Immunologic: No itchy/runny eyes, nasal congestion, recent allergic reactions, rashes  ALLERGIES: No Known Allergies  HOME MEDICATIONS:  Current Outpatient Medications:    amLODipine (NORVASC) 2.5 MG tablet, Take 1 tablet (2.5 mg total) by mouth every morning., Disp: 90 tablet, Rfl: 1   aspirin (LONGS ADULT LOW STRENGTH ASA) 81 MG EC tablet, Take 81 mg by mouth daily. , Disp: , Rfl:    BETASERON 0.3 MG KIT injection, Inject 0.25 mg into the skin every other day., Disp: 1 kit, Rfl: 11   blood glucose meter kit and supplies, Dispense based on patient and insurance preference. Check BS TID and PRN  (FOR ICD dx E11.9)., Disp: 1 each, Rfl: 1   cholecalciferol (VITAMIN D) 1000 UNITS tablet, Take 1,000 Units by mouth daily., Disp: , Rfl:    cyanocobalamin (,VITAMIN B-12,) 1000 MCG/ML injection, INJECT 1ML INTO THE SKIN ONCE MONTH, Disp: 3 mL, Rfl: 3   desmopressin (DDAVP) 0.2 MG tablet, Take 1 tablet (0.2 mg total) by mouth daily., Disp: 90 tablet, Rfl: 3   doxycycline (VIBRAMYCIN) 100 MG capsule, TAKE ONE CAPSULE BY MOUTH DAILY, Disp: 90 capsule, Rfl: 1   fish oil-omega-3 fatty acids 1000 MG capsule, Take 2 g by mouth 2 (two) times daily., Disp: , Rfl:    glucose blood test strip, Use as instructed, Disp: 100 each, Rfl: 12   isosorbide mononitrate (IMDUR) 60 MG 24 hr tablet, Take 0.5 tablets (30 mg total) by mouth  daily., Disp: 45 tablet, Rfl: 1   levothyroxine (SYNTHROID) 50 MCG tablet, TAKE ONE TABLET BY MOUTH DAILY BEFORE BREAKFAST, Disp: 90 tablet, Rfl: 1   lisinopril (ZESTRIL) 10 MG tablet, Take 1 tablet (10 mg total) by mouth daily., Disp: 90 tablet, Rfl: 1   metFORMIN (GLUCOPHAGE) 1000 MG tablet, Take 1 tablet (1,000 mg total) by mouth 2 (two) times daily with a meal., Disp: 180 tablet, Rfl: 1   omeprazole (PRILOSEC) 20 MG capsule, Take 1 capsule (20 mg total) by mouth daily., Disp: 90 capsule, Rfl: 1   pioglitazone (ACTOS) 30 MG tablet, Take 1 tablet (30 mg total) by mouth daily., Disp: 90 tablet, Rfl: 1   rosuvastatin (CRESTOR) 10 MG tablet, Take 1 tablet (10 mg total)  by mouth at bedtime., Disp: 90 tablet, Rfl: 1   sildenafil (VIAGRA) 100 MG tablet, Take 1 tablet (100 mg total) by mouth daily as needed for erectile dysfunction., Disp: 12 tablet, Rfl: 2   SYRINGE-NEEDLE, DISP, 3 ML (B-D SYRINGE/NEEDLE 3CC/25GX5/8) 25G X 5/8" 3 ML MISC, Use to give once a month B12 injections, Disp: 12 each, Rfl: 0   tamsulosin (FLOMAX) 0.4 MG CAPS capsule, Take 1 capsule (0.4 mg total) by mouth daily after supper., Disp: 90 capsule, Rfl: 1  Current Facility-Administered Medications:    cyanocobalamin ((VITAMIN B-12)) injection 1,000 mcg, 1,000 mcg, Intramuscular, Q30 days, Chipper Herb, MD, 1,000 mcg at 03/02/15 1647  PAST MEDICAL HISTORY: Past Medical History:  Diagnosis Date   Acquired leg length discrepancy    Bladder stone    BPH associated with nocturia    CAD (coronary artery disease) CARDIOLOGIST- DR HOCHREIN--  LAST VISIT 09-27-2011 IN EPIC   Non obstructive Cath 2010-  HX CORONARY SPASM 2004   Cataract    bilateral    GERD (gastroesophageal reflux disease)    H/O hiatal hernia    History of nephrolithiasis 2009   Hx of adenomatous colonic polyps    Hyperlipidemia    Hypertension    Movement disorder    Multiple sclerosis ([place]) DX  10/10---  NEUROLOGIST  DR Delfino Lovett  [facility] ([place])   RIGHT SIDE AFFECTED MORE W/ WEAKNESS- - USES CANE   Neuromuscular disorder ([place])    MS   NIDDM (non-insulin dependent diabetes mellitus)    Peyronie's disease    RBBB    Renal stone RIGHT   Rosacea    Vision abnormalities     PAST SURGICAL HISTORY: 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 / NORMAL LVF   CYSTOSCOPY W/ RETROGRADES  10/30/2011   Procedure: CYSTOSCOPY WITH RETROGRADE PYELOGRAM;  Surgeon: Claybon Jabs, MD;  Location: [facility];  Service: Urology;  Laterality: Right;   CYSTOSCOPY WITH LITHOLAPAXY  10/30/2011   Procedure: CYSTOSCOPY WITH LITHOLAPAXY;  Surgeon: Claybon Jabs, MD;  [facility];  Service: Urology;  Laterality: N/A;   EXTRACORPOREAL SHOCK WAVE LITHOTRIPSY  09-05-2010   RIGHT   EYE SURGERY Right 08/30/2018   NESBIT PROCEDURE  10-17-2004   CORRECTION OF PENILE ANGULATION (PEYRONIES DISEASE)    FAMILY HISTORY: Family History  Problem Relation Age of Onset   Stroke Mother    Heart attack Mother    Diabetes Mother    Hypertension Mother    Renal cancer Father    Diabetes Sister    Heart attack Brother    Diabetes Son    Heart attack Maternal Grandfather    Heart attack Brother    Bladder Cancer Brother     SOCIAL HISTORY:  Social History   Socioeconomic History   Marital status: Married    Spouse name: Hassan Rowan   Number of children: 2   Years of education: 13   Highest education level: Some college, no degree  Occupational History   Occupation: retired    Comment: self -employed farmer  Tobacco Use   Smoking status: Former Smoker    Packs/day: 1.00    Years: 7.00    Pack years: 7.00    Types: Cigarettes    Quit date: 09/26/1973    Years since quitting: 46.4   Smokeless tobacco: Current User  Types: Chew   Tobacco comment:  Chewing tobacco  Vaping Use   Vaping Use: Never used  Substance and Sexual Activity   Alcohol use: No    Alcohol/week: 0.0 standard drinks   Drug use: No   Sexual activity: Not on file  Other Topics Concern   Not on file  Social History Narrative   Not on file   Social Determinants of Health   Financial Resource Strain:    Difficulty of Paying Living Expenses:   Food Insecurity:    Worried About Charity fundraiser in the Last Year:    Arboriculturist in the Last Year:   Transportation Needs:    Film/video editor (Medical):    Lack of Transportation (Non-Medical):   Physical Activity:    Days of Exercise per Week:    Minutes of Exercise per Session:   Stress:    Feeling of Stress :   Social Connections:    Frequency of Communication with Friends and Family:    Frequency of Social Gatherings with Friends and Family:    Attends Religious Services:    Active Member of Clubs or Organizations:    Attends Archivist Meetings:    Marital Status:   Intimate Partner Violence:    Fear of Current or Ex-Partner:    Emotionally Abused:    Physically Abused:    Sexually Abused:      PHYSICAL EXAM  Vitals:   02/09/20 0954  BP: 121/75  Pulse: 73  Weight: 161 lb 8 oz (73.3 kg)  Height: 5' 6" (1.676 m)    Body mass index is 26.07 kg/m.   General: The patient is well-developed and well-nourished and in no acute distress  Neurologic Exam  Mental status: The patient is alert and oriented x 3 at the time of the examination. The patient has apparent normal recent and remote memory, with an apparently normal attention span and concentration ability.   Speech is normal.  Cranial nerves: Extraocular movements are full.   There is good facial sensation to soft touch bilaterally.Facial strength is normal.  Trapezius and sternocleidomastoid strength is normal. No dysarthria is noted.  The tongue is midline, and the patient has symmetric  elevation of the soft palate. No obvious hearing deficits are noted.  Motor:  Muscle bulk is normal.   Increased muscle tone in the legs, right greater than left.  The strength was 2/5 in the right and 4/5 in the left hip flexures. Strength was 3/5 in the right quad and 4+/5 on the left. Distally strength was 3 in the right leg and 4 in the left leg. Strength was 5/5 in the arms.     Sensory: Sensory testing is intact to touch and vibration sensation in all 4 extremities.  Coordination: Cerebellar testing shows good finger-nose-finger.  He has reduced heel-to-shin on the left and is unable to do on the right Gait and station: Station is normal.  The gait is spastic.  He has a fairly severe right foot drop that is helped by the AFO.  The left leg is mildly weak as he walks  The right leg is also externally rotated with a hyperextended knee as he walks.     He is unable to tandem walk.  Romberg is negative..   Reflexes:  Deep tendon reflexes were normal in the arms.  Deep tendon reflexes are increased in both legs, more on the right where he has a crossed adductor response.  DIAGNOSTIC DATA (LABS, IMAGING, TESTING) - I reviewed patient records, labs, notes, testing and imaging myself where available.  Lab Results  Component Value Date   WBC WILL FOLLOW 12/25/2019   HGB WILL FOLLOW 12/25/2019   HCT WILL FOLLOW 12/25/2019   MCV WILL FOLLOW 12/25/2019   PLT WILL FOLLOW 12/25/2019      Component Value Date/Time   NA 141 12/25/2019 0914   K 4.0 12/25/2019 0914   CL 109 (H) 12/25/2019 0914   CO2 19 (L) 12/25/2019 0914   GLUCOSE 142 (H) 12/25/2019 0914   GLUCOSE 130 (H) 01/06/2013 1311   BUN 19 12/25/2019 0914   CREATININE 1.01 12/25/2019 0914   CREATININE 1.13 01/06/2013 1311   CALCIUM 9.1 12/25/2019 0914   PROT 6.8 12/25/2019 0914   ALBUMIN 4.3 12/25/2019 0914   AST 27 12/25/2019 0914   ALT 21 12/25/2019 0914   ALKPHOS 57 12/25/2019 0914   BILITOT 0.3 12/25/2019 0914   GFRNONAA  76 12/25/2019 0914   GFRNONAA 69 01/06/2013 1311   GFRAA 87 12/25/2019 0914   GFRAA 80 01/06/2013 1311   Lab Results  Component Value Date   CHOL 130 08/27/2019   HDL 28 (L) 08/27/2019   LDLCALC 76 08/27/2019   TRIG 147 08/27/2019   CHOLHDL 4.6 08/27/2019   Lab Results  Component Value Date   HGBA1C 6.7 12/25/2019        ASSESSMENT AND PLAN  Multiple sclerosis (HCC)  Urinary dysfunction  Spastic gait  Right foot drop  Multiple falls  Other fatigue   1.   We discussed some other DMT options but he wishes to continue on Betaseron for now.   He has not yet had the Covid-19 vaccination and a response to the vaccine could be blunted or absent with anti-CD20 agents (Ocrevus, Kesimpt) or S1PR modulators (Gilenya et al).  I advised vaccination. 2.   We will  increase desmopressin to 0.2 mg (last sodium a month ago was 141 normal).   3.   Continue to use a cane or walker and right AFO.   Take care at night.  4.  He will return to see me in 6 months or sooner if he has new or worsening neurologic symptoms.   Richard A. Felecia Shelling, MD, PhD 7/41/2878, 67:67 PM Certified in Neurology, Clinical Neurophysiology, Sleep Medicine, Pain Medicine and Neuroimaging  [facility] [address] [place],  [address] [phone]

## 2020-02-26 ENCOUNTER — Other Ambulatory Visit: Payer: Self-pay

## 2020-02-26 ENCOUNTER — Encounter: Payer: Self-pay | Admitting: Urology

## 2020-02-26 ENCOUNTER — Ambulatory Visit (INDEPENDENT_AMBULATORY_CARE_PROVIDER_SITE_OTHER): Payer: PPO | Admitting: Urology

## 2020-02-26 VITALS — BP 101/57 | HR 72 | Temp 97.8°F | Ht 66.0 in | Wt 161.5 lb

## 2020-02-26 DIAGNOSIS — R351 Nocturia: Secondary | ICD-10-CM | POA: Diagnosis not present

## 2020-02-26 DIAGNOSIS — R972 Elevated prostate specific antigen [PSA]: Secondary | ICD-10-CM | POA: Diagnosis not present

## 2020-02-26 LAB — URINALYSIS, ROUTINE W REFLEX MICROSCOPIC
Bilirubin, UA: NEGATIVE
Glucose, UA: NEGATIVE
Ketones, UA: NEGATIVE
Leukocytes,UA: NEGATIVE
Nitrite, UA: NEGATIVE
RBC, UA: NEGATIVE
Specific Gravity, UA: 1.02 (ref 1.005–1.030)
Urobilinogen, Ur: 0.2 mg/dL (ref 0.2–1.0)
pH, UA: 5 (ref 5.0–7.5)

## 2020-02-26 NOTE — Progress Notes (Signed)
02/26/2020 11:02 AM   Jacob Fowler Sep 30, 1950 [id number]  Referring provider: [name], [place],  [place] [address]  Elevated PSA  HPI: Jacob Fowler is a 69yo here for evaluation of elevated PSA. PSa was 5.0 last year then 4.8 recently. He has moderate LUTS and takes flomax 0.66m daily. He has MS diagnosed in 2010. Father had prostate cancer diagnosed in his 770sand was treated with radiation therapy.    PMH: Past Medical History:  Diagnosis Date   Acquired leg length discrepancy    Bladder stone    BPH associated with nocturia    CAD (coronary artery disease) CARDIOLOGIST- DR HOCHREIN--  LAST VISIT 09-27-2011 IN EPIC   Non obstructive Cath 2010-  HX CORONARY SPASM 2004   Cataract    bilateral    GERD (gastroesophageal reflux disease)    H/O hiatal hernia    Heart murmur    Heartburn    High cholesterol    History of nephrolithiasis 2009   Hx of adenomatous colonic polyps    Hyperlipidemia    Hypertension    Movement disorder    Multiple sclerosis ([place] DX  10/10---  NEUROLOGIST  DR RDelfino LovettFATER ([place])   RIGHT SIDE AFFECTED MORE W/ WEAKNESS- - USES CANE   Neuromuscular disorder (HCC)    MS   NIDDM (non-insulin dependent diabetes mellitus)    Peyronie's disease    RBBB    Renal stone RIGHT   Rosacea    Vision abnormalities     Surgical History: Past Surgical History:  Procedure Laterality Date   CARDIAC CATHETERIZATION  02-26-2009 ---  DR TIda Rogue  NONOBSTRUCTIVE CAD/ 50% DISTAL RCA/ 30% LAD / NORMAL LVF   CYSTOSCOPY W/ RETROGRADES  10/30/2011   Procedure: CYSTOSCOPY WITH RETROGRADE PYELOGRAM;  Surgeon: MClaybon Jabs MD;  Location: [facility]  Service: Urology;  Laterality: Right;   CYSTOSCOPY WITH LITHOLAPAXY  10/30/2011   Procedure: CYSTOSCOPY WITH LITHOLAPAXY;  Surgeon: MClaybon Jabs MD;  [facility]  Service: Urology;  Laterality: N/A;    EXTRACORPOREAL SHOCK WAVE LITHOTRIPSY  09-05-2010   RIGHT   EYE SURGERY Right 08/30/2018   NESBIT PROCEDURE  10-17-2004   CORRECTION OF PENILE ANGULATION (PEYRONIES DISEASE)    Home Medications:  Allergies as of 02/26/2020   No Known Allergies     Medication List       Accurate as of February 26, 2020 11:02 AM. If you have any questions, ask your nurse or doctor.        amLODipine 2.5 MG tablet Commonly known as: NORVASC Take 1 tablet (2.5 mg total) by mouth every morning.   Betaseron 0.3 MG Kit injection Generic drug: Interferon Beta-1b Inject 0.25 mg into the skin every other day.   blood glucose meter kit and supplies Dispense based on patient and insurance preference. Check BS TID and PRN  (FOR ICD dx E11.9).   cholecalciferol 1000 units tablet Commonly known as: VITAMIN D Take 1,000 Units by mouth daily.   cyanocobalamin 1000 MCG/ML injection Commonly known as: (VITAMIN B-12) INJECT 1ML INTO THE SKIN ONCE MONTH   desmopressin 0.2 MG tablet Commonly known as: DDAVP Take 1 tablet (0.2 mg total) by mouth daily.   doxycycline 100 MG capsule Commonly known as: VIBRAMYCIN TAKE ONE CAPSULE BY MOUTH DAILY   fish oil-omega-3 fatty acids 1000 MG capsule Take 2 g by mouth 2 (two) times daily.   glucose blood  test strip Use as instructed   isosorbide mononitrate 60 MG 24 hr tablet Commonly known as: IMDUR Take 0.5 tablets (30 mg total) by mouth daily.   levothyroxine 50 MCG tablet Commonly known as: SYNTHROID TAKE ONE TABLET BY MOUTH DAILY BEFORE BREAKFAST   lisinopril 10 MG tablet Commonly known as: ZESTRIL Take 1 tablet (10 mg total) by mouth daily.   [place] Adult Low Strength ASA 81 MG EC tablet Generic drug: aspirin Take 81 mg by mouth daily.   metFORMIN 1000 MG tablet Commonly known as: GLUCOPHAGE Take 1 tablet (1,000 mg total) by mouth 2 (two) times daily with a meal.   omeprazole 20 MG capsule Commonly known as: PRILOSEC Take 1 capsule (20 mg  total) by mouth daily.   pioglitazone 30 MG tablet Commonly known as: ACTOS Take 1 tablet (30 mg total) by mouth daily.   rosuvastatin 10 MG tablet Commonly known as: Crestor Take 1 tablet (10 mg total) by mouth at bedtime.   sildenafil 100 MG tablet Commonly known as: VIAGRA Take 1 tablet (100 mg total) by mouth daily as needed for erectile dysfunction.   SYRINGE-NEEDLE (DISP) 3 ML 25G X 5/8" 3 ML Misc Commonly known as: B-D SYRINGE/NEEDLE 3CC/25GX5/8 Use to give once a month B12 injections   tamsulosin 0.4 MG Caps capsule Commonly known as: FLOMAX Take 1 capsule (0.4 mg total) by mouth daily after supper.       Allergies: No Known Allergies  Family History: Family History  Problem Relation Age of Onset   Stroke Mother    Heart attack Mother    Diabetes Mother    Hypertension Mother    Renal cancer Father    Diabetes Sister    Heart attack Brother    Diabetes Son    Heart attack Maternal Grandfather    Heart attack Brother    Bladder Cancer Brother     Social History:  reports that he quit smoking about 46 years ago. His smoking use included cigarettes. He has a 7.00 pack-year smoking history. His smokeless tobacco use includes chew. He reports that he does not drink alcohol and does not use drugs.  ROS: All other review of systems were reviewed and are negative except what is noted above in HPI  Physical Exam: BP (!) 101/57    Pulse 72    Temp 97.8 F (36.6 C)    Ht _0  (1.676 m)    Wt 161 lb 8 oz (73.3 kg)    BMI 26.07 kg/m   Constitutional:  Alert and oriented, No acute distress. HEENT:  AT, moist mucus membranes.  Trachea midline, no masses. Cardiovascular: No clubbing, cyanosis, or edema. Respiratory: Normal respiratory effort, no increased work of breathing. GI: Abdomen is soft, nontender, nondistended, no abdominal masses GU: No CVA tenderness. Circumcised phallus. No masses/lesions on penis, testis, scrotum. Prostate 60g smooth no  nodules no induration.  Lymph: No cervical or inguinal lymphadenopathy. Skin: No rashes, bruises or suspicious lesions. Neurologic: Grossly intact, no focal deficits, moving all 4 extremities. Psychiatric: Normal mood and affect.  Laboratory Data: Lab Results  Component Value Date   WBC WILL FOLLOW 12/25/2019   HGB WILL FOLLOW 12/25/2019   HCT WILL FOLLOW 12/25/2019   MCV WILL FOLLOW 12/25/2019   PLT WILL FOLLOW 12/25/2019    Lab Results  Component Value Date   CREATININE 1.01 12/25/2019    Lab Results  Component Value Date   PSA 5.5 (H) 01/28/2014   PSA 3.37 01/06/2013  PSA 3.08 10/14/2012    No results found for: TESTOSTERONE  Lab Results  Component Value Date   HGBA1C 6.7 12/25/2019    Urinalysis    Component Value Date/Time   COLORURINE YELLOW 04/06/2010 0509   APPEARANCEUR Clear 02/26/2020 1008   LABSPEC 1.026 04/06/2010 0509   PHURINE 5.0 04/06/2010 0509   GLUCOSEU Negative 02/26/2020 1008   HGBUR NEGATIVE 04/06/2010 0509   BILIRUBINUR Negative 02/26/2020 1008   KETONESUR 15 (A) 04/06/2010 0509   PROTEINUR Trace (A) 02/26/2020 1008   PROTEINUR 30 (A) 04/06/2010 0509   UROBILINOGEN negative 12/18/2014 1601   UROBILINOGEN 1.0 04/06/2010 0509   NITRITE Negative 02/26/2020 1008   NITRITE NEGATIVE 04/06/2010 0509   LEUKOCYTESUR Negative 02/26/2020 1008    Lab Results  Component Value Date   LABMICR Comment 02/26/2020   WBCUA None seen 12/25/2018   RBCUA >30 (A) 08/07/2018   LABEPIT 0-10 12/25/2018   MUCUS Present 12/25/2018   BACTERIA None seen 12/25/2018    Pertinent Imaging:  Results for orders placed during the hospital encounter of 10/30/11  KUB day of procedure  Narrative *RADIOLOGY REPORT*  Clinical Data: Right ureteral stone  ABDOMEN - 1 VIEW  Comparison: [facility] CT urogram dated 10/17/2011  Findings: Prior distal right ureteral calculus is not definitely visualized on the current radiograph.  Known nonobstructing  right lower pole calculi are also not radiographically apparent.  Calcified phleboliths in the pelvis.  Linear calcification overlying the right sacrum corresponds to a vascular calcification.  Suspected radiopaque ingested tablets in the splenic flexure, sigmoid colon (overlying superior bladder), and cecum.  Degenerative changes of the visualized thoracolumbar spine.  IMPRESSION: Prior distal right ureteral calculus is not definitely visualized on the current radiograph.  Known nonobstructing right lower pole calculi are also not radiographically apparent.  Original Report Authenticated By: Julian Hy, M.D.  No results found for this or any previous visit.  No results found for this or any previous visit.  No results found for this or any previous visit.  No results found for this or any previous visit.  No results found for this or any previous visit.  No results found for this or any previous visit.  No results found for this or any previous visit.   Assessment & Plan:    1. Elevated PSA -The patient and I talked about etiologies of elevated PSA.  We discussed the possible relationship between elevated PSA and prostate cancer, BPH, prostatitis, infection trauma and recent ejaculations.  The patient's PSA has been relatively stable over the interval and as such I recommended that we follow-up with a repeat PSA in 6 months.  If it remains elevated with a positive rising trend we will discuss prostate biopsy at his follow-up appointment.  - Urinalysis, Routine w reflex microscopic  2. Nocturia -continue flomax 0.24m daily   No follow-ups on file.  PNicolette Bang MD  [facility]Urology [place]

## 2020-02-26 NOTE — Progress Notes (Signed)
Urological Symptom Review  Patient is experiencing the following symptoms: Frequent urination Hard to postpone urination Get up at night to urinate Erection problems (male only)   Review of Systems  Gastrointestinal (upper)  : Negative for upper GI symptoms  Gastrointestinal (lower) : Negative for lower GI symptoms  Constitutional : Negative for symptoms  Skin: Negative for skin symptoms  Eyes: Negative for eye symptoms  Ear/Nose/Throat : Negative for Ear/Nose/Throat symptoms  Hematologic/Lymphatic: Negative for Hematologic/Lymphatic symptoms  Cardiovascular : Negative for cardiovascular symptoms  Respiratory : Negative for respiratory symptoms  Endocrine: Negative for endocrine symptoms  Musculoskeletal: Negative for musculoskeletal symptoms  Neurological: Negative for neurological symptoms  Psychologic: Negative for psychiatric symptoms  

## 2020-03-02 ENCOUNTER — Other Ambulatory Visit: Payer: Self-pay | Admitting: *Deleted

## 2020-03-02 DIAGNOSIS — L03116 Cellulitis of left lower limb: Secondary | ICD-10-CM

## 2020-03-16 ENCOUNTER — Telehealth: Payer: PPO | Admitting: *Deleted

## 2020-05-10 ENCOUNTER — Ambulatory Visit (INDEPENDENT_AMBULATORY_CARE_PROVIDER_SITE_OTHER): Payer: PPO

## 2020-05-10 ENCOUNTER — Other Ambulatory Visit: Payer: Self-pay

## 2020-05-10 DIAGNOSIS — Z23 Encounter for immunization: Secondary | ICD-10-CM

## 2020-05-19 ENCOUNTER — Telehealth: Payer: Self-pay | Admitting: Neurology

## 2020-05-19 NOTE — Telephone Encounter (Signed)
Pt. States he would like to be called concerning a paper he needs filled out.

## 2020-05-19 NOTE — Telephone Encounter (Signed)
Called pt back. He is needing patient assistance form for Betaseron to be filled out. There is a provider and patient portion. He will complete his portion and bring necessary documentation to our office and provider form for [place] to fill out. Once we complete provider form, we can send it all in together. He verbalized understanding. He will try to drop it off tomorrow. If not, it will be early next week.

## 2020-05-24 NOTE — Telephone Encounter (Addendum)
Faxed completed/signed application to [facility] at [phone]. Received fax confirmation.  I tried calling pt to provide update but phone kept ringing. If he calls, let him know form faxed. He should f/u in the next few days to make sure they have received and nothing further is needed.

## 2020-05-25 ENCOUNTER — Other Ambulatory Visit: Payer: Self-pay | Admitting: *Deleted

## 2020-05-25 DIAGNOSIS — L03116 Cellulitis of left lower limb: Secondary | ICD-10-CM

## 2020-05-25 MED ORDER — DOXYCYCLINE HYCLATE 100 MG PO CAPS: 100.0000 mg | ORAL_CAPSULE | Freq: Every day | ORAL | 0 refills | Status: DC

## 2020-06-02 ENCOUNTER — Other Ambulatory Visit: Payer: Self-pay | Admitting: Family Medicine

## 2020-06-02 DIAGNOSIS — E119 Type 2 diabetes mellitus without complications: Secondary | ICD-10-CM

## 2020-06-02 DIAGNOSIS — K219 Gastro-esophageal reflux disease without esophagitis: Secondary | ICD-10-CM

## 2020-06-02 DIAGNOSIS — I152 Hypertension secondary to endocrine disorders: Secondary | ICD-10-CM

## 2020-06-02 DIAGNOSIS — E1159 Type 2 diabetes mellitus with other circulatory complications: Secondary | ICD-10-CM

## 2020-06-09 ENCOUNTER — Encounter: Payer: Self-pay | Admitting: *Deleted

## 2020-06-16 NOTE — Telephone Encounter (Signed)
I called pt back and relayed below message. He verbalized understanding.

## 2020-06-16 NOTE — Telephone Encounter (Signed)
Called pt back. He confirmed [facility] received his application. They do not have prescription, current one expired. He asked I call number provided. Advised I will do so. I called and spoke with pharmacist, Bucky. Provided VO 3 kits, 3 refills for Betaseron 0.7m Accident QOD. Ok to dispense 1 kit, 11 refills if needed. He verbalized understanding and will get this over to [facility], nothing further needed.

## 2020-06-16 NOTE — Telephone Encounter (Signed)
Pt has called and the message from [place], [place] was relayed to him.  Pt states the prescription for his BETASERON 0.3 MG KIT injection has run out and he is asking a new prescription be sent to them.  Pt provided a telephone # for [facility] [phone]

## 2020-06-17 NOTE — Telephone Encounter (Addendum)
[facility] and spoke with Ander Purpura. Advised pt trying to refill Betaseron for this month, but being told there is no rx on file. Only have rx for 2022. She states they do not see VO that was called in yesterday. I advised I spoke with pharmacist, Bucky. She placed me on hold to research. She states it can take 24-48hr for script to be in system. I advised pt out of medication. She is going to send email to escalate his case. I called pt and provided an update.

## 2020-06-17 NOTE — Telephone Encounter (Signed)
Tried [facility] [phone]. They do not open until 9am

## 2020-06-17 NOTE — Telephone Encounter (Signed)
Pt called, pharmacy told me they need a prescription forBETASERON 0.3 MG KIT injection December, only have prescription for next year (2022). Would like a call from the nurse.

## 2020-07-01 ENCOUNTER — Other Ambulatory Visit: Payer: Self-pay | Admitting: Family Medicine

## 2020-07-01 DIAGNOSIS — E039 Hypothyroidism, unspecified: Secondary | ICD-10-CM

## 2020-07-05 ENCOUNTER — Other Ambulatory Visit: Payer: Self-pay | Admitting: Nurse Practitioner

## 2020-07-05 DIAGNOSIS — E039 Hypothyroidism, unspecified: Secondary | ICD-10-CM

## 2020-07-13 ENCOUNTER — Ambulatory Visit: Payer: PPO | Admitting: Family Medicine

## 2020-07-28 ENCOUNTER — Ambulatory Visit (INDEPENDENT_AMBULATORY_CARE_PROVIDER_SITE_OTHER): Payer: PPO | Admitting: Family Medicine

## 2020-07-28 ENCOUNTER — Other Ambulatory Visit: Payer: Self-pay

## 2020-07-28 ENCOUNTER — Encounter: Payer: Self-pay | Admitting: Family Medicine

## 2020-07-28 VITALS — BP 105/64 | HR 73 | Temp 97.5°F | Resp 20 | Ht 66.0 in | Wt 159.0 lb

## 2020-07-28 DIAGNOSIS — I700 Atherosclerosis of aorta: Secondary | ICD-10-CM

## 2020-07-28 DIAGNOSIS — I152 Hypertension secondary to endocrine disorders: Secondary | ICD-10-CM

## 2020-07-28 DIAGNOSIS — R972 Elevated prostate specific antigen [PSA]: Secondary | ICD-10-CM

## 2020-07-28 DIAGNOSIS — E785 Hyperlipidemia, unspecified: Secondary | ICD-10-CM | POA: Diagnosis not present

## 2020-07-28 DIAGNOSIS — E1169 Type 2 diabetes mellitus with other specified complication: Secondary | ICD-10-CM | POA: Diagnosis not present

## 2020-07-28 DIAGNOSIS — G35 Multiple sclerosis: Secondary | ICD-10-CM

## 2020-07-28 DIAGNOSIS — G35D Multiple sclerosis, unspecified: Secondary | ICD-10-CM

## 2020-07-28 DIAGNOSIS — K219 Gastro-esophageal reflux disease without esophagitis: Secondary | ICD-10-CM

## 2020-07-28 DIAGNOSIS — D696 Thrombocytopenia, unspecified: Secondary | ICD-10-CM

## 2020-07-28 DIAGNOSIS — E1159 Type 2 diabetes mellitus with other circulatory complications: Secondary | ICD-10-CM | POA: Diagnosis not present

## 2020-07-28 DIAGNOSIS — E559 Vitamin D deficiency, unspecified: Secondary | ICD-10-CM

## 2020-07-28 DIAGNOSIS — E119 Type 2 diabetes mellitus without complications: Secondary | ICD-10-CM | POA: Diagnosis not present

## 2020-07-28 DIAGNOSIS — L719 Rosacea, unspecified: Secondary | ICD-10-CM | POA: Diagnosis not present

## 2020-07-28 DIAGNOSIS — E039 Hypothyroidism, unspecified: Secondary | ICD-10-CM | POA: Diagnosis not present

## 2020-07-28 DIAGNOSIS — R39198 Other difficulties with micturition: Secondary | ICD-10-CM

## 2020-07-28 DIAGNOSIS — E538 Deficiency of other specified B group vitamins: Secondary | ICD-10-CM | POA: Diagnosis not present

## 2020-07-28 LAB — BAYER DCA HB A1C WAIVED: HB A1C (BAYER DCA - WAIVED): 6.6 % (ref <7.0)

## 2020-07-28 MED ORDER — AMLODIPINE BESYLATE 2.5 MG PO TABS: 2.5000 mg | ORAL_TABLET | Freq: Every day | ORAL | 1 refills | Status: DC

## 2020-07-28 MED ORDER — LISINOPRIL 10 MG PO TABS
10.0000 mg | ORAL_TABLET | Freq: Every day | ORAL | 1 refills | Status: DC
Start: 2020-07-28 — End: 2021-01-25

## 2020-07-28 MED ORDER — ISOSORBIDE MONONITRATE ER 60 MG PO TB24: 30.0000 mg | ORAL_TABLET | Freq: Every day | ORAL | 1 refills | Status: DC

## 2020-07-28 MED ORDER — PIOGLITAZONE HCL 30 MG PO TABS: 30.0000 mg | ORAL_TABLET | Freq: Every day | ORAL | 1 refills | Status: DC

## 2020-07-28 MED ORDER — ROSUVASTATIN CALCIUM 10 MG PO TABS
10.0000 mg | ORAL_TABLET | Freq: Every day | ORAL | 1 refills | Status: DC
Start: 2020-07-28 — End: 2021-01-20

## 2020-07-28 MED ORDER — METFORMIN HCL 1000 MG PO TABS
1000.0000 mg | ORAL_TABLET | Freq: Two times a day (BID) | ORAL | 1 refills | Status: DC
Start: 2020-07-28 — End: 2021-01-20

## 2020-07-28 MED ORDER — LEVOTHYROXINE SODIUM 50 MCG PO TABS: 50.0000 ug | ORAL_TABLET | Freq: Every day | ORAL | 1 refills | Status: DC

## 2020-07-28 MED ORDER — TAMSULOSIN HCL 0.4 MG PO CAPS: 0.4000 mg | ORAL_CAPSULE | Freq: Every day | ORAL | 1 refills | Status: DC

## 2020-07-28 MED ORDER — OMEPRAZOLE 20 MG PO CPDR: 20.0000 mg | DELAYED_RELEASE_CAPSULE | Freq: Every day | ORAL | 1 refills | Status: DC

## 2020-07-28 MED ORDER — DOXYCYCLINE HYCLATE 100 MG PO CAPS: 100.0000 mg | ORAL_CAPSULE | Freq: Every day | ORAL | 1 refills | Status: DC

## 2020-07-28 NOTE — Patient Instructions (Signed)
Please schedule your diabetic eye exam.

## 2020-07-28 NOTE — Progress Notes (Signed)
Assessment & Plan:  1. Diabetes mellitus type 2 in nonobese [facility]) Lab Results  Component Value Date   HGBA1C 6.6 07/28/2020   HGBA1C 6.7 12/25/2019   HGBA1C 7.8 (H) 08/27/2019   - Diabetes is at goal of A1c < 7. - Medications: continue current medications - Home glucose monitoring: continue monitoring - Patient is currently taking a statin. Patient is taking an ACE-inhibitor/ARB.  - Last foot exam: today - Last diabetic eye exam: 2019 per our records, recent per patient - Urine Microalbumin/Creat Ratio: today - Instruction/counseling given: reminded to get eye exam and discussed foot care - CBC with Differential/Platelet - [facility] DCA Hb A1c Waived - Microalbumin / creatinine urine ratio - lisinopril (ZESTRIL) 10 MG tablet; Take 1 tablet (10 mg total) by mouth daily.  Dispense: 90 tablet; Refill: 1 - metFORMIN (GLUCOPHAGE) 1000 MG tablet; Take 1 tablet (1,000 mg total) by mouth 2 (two) times daily with a meal.  Dispense: 180 tablet; Refill: 1 - pioglitazone (ACTOS) 30 MG tablet; Take 1 tablet (30 mg total) by mouth daily.  Dispense: 90 tablet; Refill: 1 - rosuvastatin (CRESTOR) 10 MG tablet; Take 1 tablet (10 mg total) by mouth at bedtime.  Dispense: 90 tablet; Refill: 1  2. Hypertension associated with type 2 diabetes mellitus ([place]) - Well controlled on current regimen.  - CBC with Differential/Platelet - CMP14+EGFR - amLODipine (NORVASC) 2.5 MG tablet; Take 1 tablet (2.5 mg total) by mouth daily.  Dispense: 90 tablet; Refill: 1 - isosorbide mononitrate (IMDUR) 60 MG 24 hr tablet; Take 0.5 tablets (30 mg total) by mouth daily.  Dispense: 45 tablet; Refill: 1 - lisinopril (ZESTRIL) 10 MG tablet; Take 1 tablet (10 mg total) by mouth daily.  Dispense: 90 tablet; Refill: 1  3. Hyperlipidemia associated with type 2 diabetes mellitus ([place]) - Well controlled on current regimen.  - CBC with Differential/Platelet - Lipid panel - rosuvastatin (CRESTOR) 10 MG tablet; Take 1 tablet (10 mg  total) by mouth at bedtime.  Dispense: 90 tablet; Refill: 1  4. Aortic atherosclerosis ([place]) - Well controlled on current regimen. Continue statin and ASA.  - CBC with Differential/Platelet - Lipid panel  5. Thrombocytopenia (H[facility]) - CBC with Differential/Platelet  6. B12 deficiency - Well controlled on current regimen.  - Vitamin B12  7. Vitamin D deficiency - Well controlled on current regimen.   8. Hypothyroidism, unspecified type - Well controlled on current regimen.  - levothyroxine (SYNTHROID) 50 MCG tablet; Take 1 tablet (50 mcg total) by mouth daily before breakfast.  Dispense: 90 tablet; Refill: 1  9. Gastroesophageal reflux disease, unspecified whether esophagitis present - Well controlled on current regimen.  - omeprazole (PRILOSEC) 20 MG capsule; Take 1 capsule (20 mg total) by mouth daily.  Dispense: 90 capsule; Refill: 1  10. Rosacea - Well controlled on current regimen.  - doxycycline (VIBRAMYCIN) 100 MG capsule; Take 1 capsule (100 mg total) by mouth daily.  Dispense: 90 capsule; Refill: 1  11. Elevated PSA - Managed by urology.  12. Urinary dysfunction - Well controlled on current regimen. Managed by urology. - tamsulosin (FLOMAX) 0.4 MG CAPS capsule; Take 1 capsule (0.4 mg total) by mouth daily after supper.  Dispense: 90 capsule; Refill: 1  13. Multiple sclerosis ([place]) - Managed by neurology.   Return in about 6 months (around 01/25/2021) for annual physical.  Jacob Limes, MSN, APRN, FNP-C [facility]  Subjective:    [name], male    DOB: [birth year], 69  y.o.   MRN: [id number]  Patient Care Team: Jacob Brooklyn, FNP as PCP - General (Family Medicine) [facility], Jacob Means, MD (Neurology) Jacob Rhodes, MD (Inactive) as Consulting Physician (Urology) Jacob Breeding, MD as Consulting Physician (Cardiology) Jacob China, RN as Registered Nurse   Chief Complaint:  Chief Complaint  Patient presents with   . Medical Management of Chronic Issues  . Hypertension  . Diabetes  . Hyperlipidemia    HPI: Jacob Fowler is a 70 y.o. male presenting on 07/28/2020 for Medical Management of Chronic Issues, Hypertension, Diabetes, and Hyperlipidemia  Patient is accompanied by his wife who he is okay with being present.   Diabetes: Patient presents for follow up of diabetes. Current symptoms include: none. Known diabetic complications: none. Medication compliance: yes. Current diet: in general, a "healthy" diet  . Current exercise: none. Home blood sugar records: BGs are running consistent with Hgb A1C. Is he  on ACE inhibitor or angiotensin II receptor blocker? Yes. Is he on a statin? Yes.   Lab Results  Component Value Date   HGBA1C 6.6 07/28/2020   HGBA1C 6.7 12/25/2019   HGBA1C 7.8 (H) 08/27/2019   Lab Results  Component Value Date   MICROALBUR negative 02/19/2015   LDLCALC 76 08/27/2019   CREATININE 1.01 12/25/2019     Patient is following with neurology for MS and urology for his elevated PSA.   New complaints: None  Social history:  Relevant past medical, surgical, family and social history reviewed and updated as indicated. Interim medical history since our last visit reviewed.  Allergies and medications reviewed and updated.  DATA REVIEWED: CHART IN EPIC  ROS: Negative unless specifically indicated above in HPI.    Current Outpatient Medications:  .  amLODipine (NORVASC) 2.5 MG tablet, TAKE ONE TABLET (2.5 MG TOTAL) BY MOUTH EVERY MORNING, Disp: 90 tablet, Rfl: 0 .  aspirin (LONGS ADULT LOW STRENGTH ASA) 81 MG EC tablet, Take 81 mg by mouth daily. , Disp: , Rfl:  .  BETASERON 0.3 MG KIT injection, Inject 0.25 mg into the skin every other day., Disp: 1 kit, Rfl: 11 .  blood glucose meter kit and supplies, Dispense based on patient and insurance preference. Check BS TID and PRN  (FOR ICD dx E11.9)., Disp: 1 each, Rfl: 1 .  cholecalciferol (VITAMIN D) 1000 UNITS tablet, Take  1,000 Units by mouth daily., Disp: , Rfl:  .  cyanocobalamin (,VITAMIN B-12,) 1000 MCG/ML injection, INJECT 1ML INTO THE SKIN ONCE MONTH, Disp: 3 mL, Rfl: 3 .  desmopressin (DDAVP) 0.2 MG tablet, Take 1 tablet (0.2 mg total) by mouth daily., Disp: 90 tablet, Rfl: 3 .  doxycycline (VIBRAMYCIN) 100 MG capsule, Take 1 capsule (100 mg total) by mouth daily., Disp: 90 capsule, Rfl: 0 .  fish oil-omega-3 fatty acids 1000 MG capsule, Take 2 g by mouth 2 (two) times daily., Disp: , Rfl:  .  glucose blood test strip, Use as instructed, Disp: 100 each, Rfl: 12 .  isosorbide mononitrate (IMDUR) 60 MG 24 hr tablet, TAKE 1/2 TABLET (30 MG TOTAL) BY MOUTH ONCE DAILY, Disp: 45 tablet, Rfl: 0 .  levothyroxine (SYNTHROID) 50 MCG tablet, TAKE ONE TABLET BY MOUTH ONCE DAILY BEFORE BREAKFAST, Disp: 30 tablet, Rfl: 0 .  lisinopril (ZESTRIL) 10 MG tablet, TAKE ONE TABLET (10 MG TOTAL) BY MOUTH ONCE DAILY, Disp: 90 tablet, Rfl: 0 .  metFORMIN (GLUCOPHAGE) 1000 MG tablet, Take 1 tablet (1,000 mg total) by mouth 2 (two) times daily with a  meal., Disp: 180 tablet, Rfl: 1 .  omeprazole (PRILOSEC) 20 MG capsule, TAKE ONE CAPSULE (20 MG TOTAL) BY MOUTH ONCE DAILY, Disp: 90 capsule, Rfl: 0 .  pioglitazone (ACTOS) 30 MG tablet, TAKE ONE TABLET (30 MG TOTAL) BY MOUTH ONCE DAILY, Disp: 90 tablet, Rfl: 0 .  rosuvastatin (CRESTOR) 10 MG tablet, Take 1 tablet (10 mg total) by mouth at bedtime., Disp: 90 tablet, Rfl: 1 .  sildenafil (VIAGRA) 100 MG tablet, Take 1 tablet (100 mg total) by mouth daily as needed for erectile dysfunction., Disp: 12 tablet, Rfl: 2 .  SYRINGE-NEEDLE, DISP, 3 ML (B-D SYRINGE/NEEDLE 3CC/25GX5/8) 25G X 5/8" 3 ML MISC, Use to give once a month B12 injections, Disp: 12 each, Rfl: 0 .  tamsulosin (FLOMAX) 0.4 MG CAPS capsule, TAKE ONE CAPSULE (.4 MG TOTAL) BY MOUTH ONCE DAILY AFTER SUPPER, Disp: 90 capsule, Rfl: 0  Current Facility-Administered Medications:  .  cyanocobalamin ((VITAMIN B-12)) injection 1,000 mcg,  1,000 mcg, Intramuscular, Q30 days, Chipper Herb, MD, 1,000 mcg at 03/02/15 1647   No Known Allergies Past Medical History:  Diagnosis Date  . Acquired leg length discrepancy   . Bladder stone   . BPH associated with nocturia   . CAD (coronary artery disease) CARDIOLOGIST- DR HOCHREIN--  LAST VISIT 09-27-2011 IN EPIC   Non obstructive Cath 2010-  HX CORONARY SPASM 2004  . Cataract    bilateral   . GERD (gastroesophageal reflux disease)   . H/O hiatal hernia   . Heart murmur   . Heartburn   . High cholesterol   . History of nephrolithiasis 2009  . Hx of adenomatous colonic polyps   . Hyperlipidemia   . Hypertension   . Movement disorder   . Multiple sclerosis ([place]) DX  10/10---  NEUROLOGIST  DR Delfino Lovett [facility] ([place])   RIGHT SIDE AFFECTED MORE W/ WEAKNESS- - USES CANE  . Neuromuscular disorder (H[facility])    MS  . NIDDM (non-insulin dependent diabetes mellitus)   . Peyronie's disease   . RBBB   . Renal stone RIGHT  . Rosacea   . Vision abnormalities     Past Surgical History:  Procedure Laterality Date  . CARDIAC CATHETERIZATION  02-26-2009 ---  DR Ida Rogue   NONOBSTRUCTIVE CAD/ 50% DISTAL RCA/ 30% LAD / NORMAL LVF  . CYSTOSCOPY W/ RETROGRADES  10/30/2011   Procedure: CYSTOSCOPY WITH RETROGRADE PYELOGRAM;  Surgeon: Claybon Jabs, MD;  [facility];  Service: Urology;  Laterality: Right;  . CYSTOSCOPY WITH LITHOLAPAXY  10/30/2011   Procedure: CYSTOSCOPY WITH LITHOLAPAXY;  Surgeon: Claybon Jabs, MD;  Location: [facility];  Service: Urology;  Laterality: N/A;  . EXTRACORPOREAL SHOCK WAVE LITHOTRIPSY  09-05-2010   RIGHT  . EYE SURGERY Right 08/30/2018  . NESBIT PROCEDURE  10-17-2004   CORRECTION OF PENILE ANGULATION (PEYRONIES DISEASE)    Social History   Socioeconomic History  . Marital status: Married    Spouse name: Hassan Rowan  . Number of children: 2  . Years of education: 9  . Highest education level: Some college, no  degree  Occupational History  . Occupation: retired    Comment: self -employed farmer  Tobacco Use  . Smoking status: Former Smoker    Packs/day: 1.00    Years: 7.00    Pack years: 7.00    Types: Cigarettes    Quit date: 09/26/1973    Years since quitting: 46.8  . Smokeless tobacco: Current User    Types:  Chew  . Tobacco comment: Chewing tobacco  Vaping Use  . Vaping Use: Never used  Substance and Sexual Activity  . Alcohol use: No    Alcohol/week: 0.0 standard drinks  . Drug use: No  . Sexual activity: Not on file  Other Topics Concern  . Not on file  Social History Narrative  . Not on file   Social Determinants of Health   Financial Resource Strain: Not on file  Food Insecurity: Not on file  Transportation Needs: Not on file  Physical Activity: Not on file  Stress: Not on file  Social Connections: Not on file  Intimate Partner Violence: Not on file        Objective:    BP 105/64   Pulse 73   Temp (!) 97.5 F (36.4 C)   Resp 20   Ht 5' 6"  (1.676 m)   Wt 159 lb (72.1 kg)   SpO2 98%   BMI 25.66 kg/m   Wt Readings from Last 3 Encounters:  02/26/20 161 lb 8 oz (73.3 kg)  02/09/20 161 lb 8 oz (73.3 kg)  08/27/19 160 lb (72.6 kg)    Physical Exam Vitals reviewed.  Constitutional:      General: He is not in acute distress.    Appearance: Normal appearance. He is normal weight. He is not ill-appearing, toxic-appearing or diaphoretic.  HENT:     Head: Normocephalic and atraumatic.  Eyes:     General: No scleral icterus.       Right eye: No discharge.        Left eye: No discharge.     Conjunctiva/sclera: Conjunctivae normal.  Cardiovascular:     Rate and Rhythm: Normal rate and regular rhythm.     Heart sounds: Normal heart sounds. No murmur heard. No friction rub. No gallop.   Pulmonary:     Effort: Pulmonary effort is normal. No respiratory distress.     Breath sounds: Normal breath sounds. No stridor. No wheezing, rhonchi or rales.   Musculoskeletal:        General: Normal range of motion.     Cervical back: Normal range of motion.     Right foot: Foot drop present.  Skin:    General: Skin is warm and dry.  Neurological:     Mental Status: He is alert and oriented to person, place, and time. Mental status is at baseline.     Gait: Gait abnormal (ambulates with rolling walker).  Psychiatric:        Mood and Affect: Mood normal.        Behavior: Behavior normal.        Thought Content: Thought content normal.        Judgment: Judgment normal.    Diabetic Foot Exam - Simple   Simple Foot Form Diabetic Foot exam was performed with the following findings: Yes 07/28/2020  8:39 AM  Visual Inspection No deformities, no ulcerations, no other skin breakdown bilaterally: Yes Sensation Testing Intact to touch and monofilament testing bilaterally: Yes Pulse Check Posterior Tibialis and Dorsalis pulse intact bilaterally: Yes Comments      Lab Results  Component Value Date   TSH 2.600 12/25/2019   Lab Results  Component Value Date   WBC WILL FOLLOW 12/25/2019   HGB WILL FOLLOW 12/25/2019   HCT WILL FOLLOW 12/25/2019   MCV WILL FOLLOW 12/25/2019   PLT WILL FOLLOW 12/25/2019   Lab Results  Component Value Date   NA 141 12/25/2019   K 4.0  12/25/2019   CO2 19 (L) 12/25/2019   GLUCOSE 142 (H) 12/25/2019   BUN 19 12/25/2019   CREATININE 1.01 12/25/2019   BILITOT 0.3 12/25/2019   ALKPHOS 57 12/25/2019   AST 27 12/25/2019   ALT 21 12/25/2019   PROT 6.8 12/25/2019   ALBUMIN 4.3 12/25/2019   CALCIUM 9.1 12/25/2019   Lab Results  Component Value Date   CHOL 130 08/27/2019   Lab Results  Component Value Date   HDL 28 (L) 08/27/2019   Lab Results  Component Value Date   LDLCALC 76 08/27/2019   Lab Results  Component Value Date   TRIG 147 08/27/2019   Lab Results  Component Value Date   CHOLHDL 4.6 08/27/2019   Lab Results  Component Value Date   HGBA1C 6.7 12/25/2019

## 2020-07-29 LAB — CBC WITH DIFFERENTIAL/PLATELET
Basophils Absolute: 0 10*3/uL (ref 0.0–0.2)
Basos: 0 %
EOS (ABSOLUTE): 0.1 10*3/uL (ref 0.0–0.4)
Eos: 2 %
Hematocrit: 35.4 % — ABNORMAL LOW (ref 37.5–51.0)
Hemoglobin: 12 g/dL — ABNORMAL LOW (ref 13.0–17.7)
Immature Grans (Abs): 0 10*3/uL (ref 0.0–0.1)
Immature Granulocytes: 0 %
Lymphocytes Absolute: 0.6 10*3/uL — ABNORMAL LOW (ref 0.7–3.1)
Lymphs: 13 %
MCH: 29.6 pg (ref 26.6–33.0)
MCHC: 33.9 g/dL (ref 31.5–35.7)
MCV: 87 fL (ref 79–97)
Monocytes Absolute: 0.5 10*3/uL (ref 0.1–0.9)
Monocytes: 11 %
Neutrophils Absolute: 3.5 10*3/uL (ref 1.4–7.0)
Neutrophils: 74 %
Platelets: 181 10*3/uL (ref 150–450)
RBC: 4.05 x10E6/uL — ABNORMAL LOW (ref 4.14–5.80)
RDW: 14 % (ref 11.6–15.4)
WBC: 4.8 10*3/uL (ref 3.4–10.8)

## 2020-07-29 LAB — LIPID PANEL
Chol/HDL Ratio: 5.2 ratio — ABNORMAL HIGH (ref 0.0–5.0)
Cholesterol, Total: 131 mg/dL (ref 100–199)
HDL: 25 mg/dL — ABNORMAL LOW (ref >39)
LDL Chol Calc (NIH): 78 mg/dL (ref 0–99)
Triglycerides: 158 mg/dL — ABNORMAL HIGH (ref 0–149)
VLDL Cholesterol Cal: 28 mg/dL (ref 5–40)

## 2020-07-29 LAB — MICROALBUMIN / CREATININE URINE RATIO
Creatinine, Urine: 133.2 mg/dL
Microalb/Creat Ratio: 51 mg/g creat — ABNORMAL HIGH (ref 0–29)
Microalbumin, Urine: 67.9 ug/mL

## 2020-07-29 LAB — CMP14+EGFR
ALT: 16 IU/L (ref 0–44)
AST: 21 IU/L (ref 0–40)
Albumin/Globulin Ratio: 2 (ref 1.2–2.2)
Albumin: 4.5 g/dL (ref 3.8–4.8)
Alkaline Phosphatase: 53 IU/L (ref 44–121)
BUN/Creatinine Ratio: 21 (ref 10–24)
BUN: 23 mg/dL (ref 8–27)
Bilirubin Total: 0.4 mg/dL (ref 0.0–1.2)
CO2: 23 mmol/L (ref 20–29)
Calcium: 10.1 mg/dL (ref 8.6–10.2)
Chloride: 103 mmol/L (ref 96–106)
Creatinine, Ser: 1.08 mg/dL (ref 0.76–1.27)
GFR calc Af Amer: 80 mL/min/{1.73_m2} (ref >59)
GFR calc non Af Amer: 69 mL/min/{1.73_m2} (ref >59)
Globulin, Total: 2.3 g/dL (ref 1.5–4.5)
Glucose: 122 mg/dL — ABNORMAL HIGH (ref 65–99)
Potassium: 4.2 mmol/L (ref 3.5–5.2)
Sodium: 141 mmol/L (ref 134–144)
Total Protein: 6.8 g/dL (ref 6.0–8.5)

## 2020-07-31 LAB — SPECIMEN STATUS REPORT

## 2020-07-31 LAB — VITAMIN B12: Vitamin B-12: 396 pg/mL (ref 232–1245)

## 2020-08-16 ENCOUNTER — Telehealth: Payer: Self-pay | Admitting: Neurology

## 2020-08-16 ENCOUNTER — Ambulatory Visit: Payer: PPO | Admitting: Neurology

## 2020-08-16 ENCOUNTER — Encounter: Payer: Self-pay | Admitting: Neurology

## 2020-08-16 VITALS — BP 103/57 | HR 68 | Ht 66.0 in | Wt 159.0 lb

## 2020-08-16 DIAGNOSIS — G35 Multiple sclerosis: Secondary | ICD-10-CM

## 2020-08-16 DIAGNOSIS — R39198 Other difficulties with micturition: Secondary | ICD-10-CM | POA: Diagnosis not present

## 2020-08-16 DIAGNOSIS — M21371 Foot drop, right foot: Secondary | ICD-10-CM

## 2020-08-16 DIAGNOSIS — R5383 Other fatigue: Secondary | ICD-10-CM

## 2020-08-16 DIAGNOSIS — R261 Paralytic gait: Secondary | ICD-10-CM

## 2020-08-16 NOTE — Progress Notes (Addendum)
[facility]  PATIENT: Jacob Fowler DOB: 12-04-1950  REFERRING DOCTOR OR PCP:  Redge Gainer  _________________________________   HISTORICAL  CHIEF COMPLAINT:  Chief Complaint  Patient presents with  . Follow-up    RM 12. Last seen 02/09/20. On Betaseron for MS. Has loss of function next day after injection sometimes.  Uses walker. No falls that results in any injury per pt since last visit.     HISTORY OF PRESENT ILLNESS:  Jacob Fowler is a 70 year old man with multiple sclerosis.      Update 08/16/2020:  He is on Betaseron.   He heels weaker after about 1/3 of the shots and sometimes only takes twice weekly.     We had discussed other med's in the past.  MRI in 2020 showed no progression compared to 2018 so we opted to continue Betaseron.  He has 2 spinal cord plaques.    We discussed none of the medications are likely to significantly affect the slow progression.   He has had 4 stable MRI's brain/cervical spine since 2012  I reviewed labs from 07/28/2020:  Lymphocytes 0.6 and HgB 12.0.   CMP ok except increased glucose.     He is slowly worsening with a little Fowler right leg weakness and spasticity.   No recent fall.   Had one last year.       He wears the AFO during the day.   He uses a walker in the house.  Ampyra and amantadine have not helped his gait.  He has urinary frequency,  urgency and urinary hesitancy. Tamsulosin helps.  Oxybutynin did not help much and Myrbetriq was too expensive     He has received a benefit from desmopressin 0.2 mg nightly.  We had a conversation about the Covid 19 vaccination.  I recommend that he get the vaccination.  He is reluctant to do so.  I discussed that the risk of the vaccination is small lower than the risk he would face if he cord Covid.   He has not had Covid.   He stays in his house most of the time.   MS History:    He began to note significant difficulty with gait in January 2010.      He had some back x-rays.  He was then referred to neurosurgery that noted that he had plaques in his spine c/w  MS. He had further testing including MRI of the brain and spinal tap. These were all consistent with multiple sclerosis and by November 2010 he had started Betaseron. He feels that he has been mostly stable over the past 5 years but has noted that his gait is a little worse than it was in 2010. About 2013, he started using a cane.  Around 2020 he started using a walker Fowler  IMAGING MRI cervical spine2/19/18 shows T2 hyperintense lesions within the spinal cord to the left adjacent to C4-C5 and to the right adjacent to T1, unchanged in appearance when compared to the previous MRI.     Mild degenerative changes at C3-C4, C4-C5 and C6-C7 that did not lead to any nerve root compression.    There is a normal enhancement pattern and there are no acute findings.  08/25/2016 MRI of the brain with and without contrast shows   Multiple T2/FLAIR hyperintense foci in the periventricular, juxtacortical and deep white matter of the hemispheres and 2 foci in the brainstem in a pattern and configuration consistent with chronic demyelinating plaque associated with multiple  sclerosis. None of the foci appears to be acute. When compared to the MRI dated 09/28/2014, there is no interval change..   Mild to moderate cortical atrophy that has mildly progressed when compared to the previous MRI.Marland Kitchen    There are no acute findings and there is a normal enhancement pattern.  MRI 03/31/2019 brain and cervical spine shows " T2 hyperintense lesions within the spinal cord to the left adjacent to C4-C5 and to the right adjacent to T1, unchanged in appearance when compared to the previous MRI."   MRI brain showed no change in MS lesions.   REVIEW OF SYSTEMS: Constitutional: No fevers, chills, sweats, or change in appetite.  He notes fatigue most afternoon.  He has occasional insomnia. Eyes: No visual changes, double vision, eye pain Ear, nose and throat:  No hearing loss, ear pain, nasal congestion, sore throat Cardiovascular: No chest pain, palpitations Respiratory: No shortness of breath at rest or with exertion.   No wheezes GastrointestinaI: No nausea, vomiting, diarrhea, abdominal pain, fecal incontinence Genitourinary: urinary frequency and hesitancy  .  He has nocturia.   Musculoskeletal: No neck pain, back pain Integumentary: He denies rash or skin lesions. Neurological: as above Psychiatric: No depression at this time.  No anxiety Endocrine: No palpitations, diaphoresis, change in appetite, change in weigh or increased thirst Hematologic/Lymphatic: No anemia, purpura, petechiae. Allergic/Immunologic: No itchy/runny eyes, nasal congestion, recent allergic reactions, rashes  ALLERGIES: No Known Allergies  HOME MEDICATIONS:  Current Outpatient Medications:  .  amLODipine (NORVASC) 2.5 MG tablet, Take 1 tablet (2.5 mg total) by mouth daily., Disp: 90 tablet, Rfl: 1 .  aspirin 81 MG EC tablet, Take 81 mg by mouth daily., Disp: , Rfl:  .  BETASERON 0.3 MG KIT injection, Inject 0.25 mg into the skin every other day., Disp: 1 kit, Rfl: 11 .  cholecalciferol (VITAMIN D) 1000 UNITS tablet, Take 1,000 Units by mouth daily., Disp: , Rfl:  .  cyanocobalamin (,VITAMIN B-12,) 1000 MCG/ML injection, INJECT 1ML INTO THE SKIN ONCE MONTH, Disp: 3 mL, Rfl: 3 .  desmopressin (DDAVP) 0.2 MG tablet, Take 1 tablet (0.2 mg total) by mouth daily., Disp: 90 tablet, Rfl: 3 .  doxycycline (VIBRAMYCIN) 100 MG capsule, Take 1 capsule (100 mg total) by mouth daily., Disp: 90 capsule, Rfl: 1 .  fish oil-omega-3 fatty acids 1000 MG capsule, Take 2 g by mouth 2 (two) times daily., Disp: , Rfl:  .  glucose blood test strip, Use as instructed, Disp: 100 each, Rfl: 12 .  isosorbide mononitrate (IMDUR) 60 MG 24 hr tablet, Take 0.5 tablets (30 mg total) by mouth daily., Disp: 45 tablet, Rfl: 1 .  levothyroxine (SYNTHROID) 50 MCG tablet, Take 1 tablet (50 mcg total)  by mouth daily before breakfast., Disp: 90 tablet, Rfl: 1 .  lisinopril (ZESTRIL) 10 MG tablet, Take 1 tablet (10 mg total) by mouth daily., Disp: 90 tablet, Rfl: 1 .  metFORMIN (GLUCOPHAGE) 1000 MG tablet, Take 1 tablet (1,000 mg total) by mouth 2 (two) times daily with a meal., Disp: 180 tablet, Rfl: 1 .  omeprazole (PRILOSEC) 20 MG capsule, Take 1 capsule (20 mg total) by mouth daily., Disp: 90 capsule, Rfl: 1 .  pioglitazone (ACTOS) 30 MG tablet, Take 1 tablet (30 mg total) by mouth daily., Disp: 90 tablet, Rfl: 1 .  rosuvastatin (CRESTOR) 10 MG tablet, Take 1 tablet (10 mg total) by mouth at bedtime., Disp: 90 tablet, Rfl: 1 .  sildenafil (VIAGRA) 100 MG tablet, Take 1 tablet (  100 mg total) by mouth daily as needed for erectile dysfunction., Disp: 12 tablet, Rfl: 2 .  SYRINGE-NEEDLE, DISP, 3 ML (B-D SYRINGE/NEEDLE 3CC/25GX5/8) 25G X 5/8" 3 ML MISC, Use to give once a month B12 injections, Disp: 12 each, Rfl: 0 .  tamsulosin (FLOMAX) 0.4 MG CAPS capsule, Take 1 capsule (0.4 mg total) by mouth daily after supper., Disp: 90 capsule, Rfl: 1  PAST MEDICAL HISTORY: Past Medical History:  Diagnosis Date  . Acquired leg length discrepancy   . Bladder stone   . BPH associated with nocturia   . CAD (coronary artery disease) CARDIOLOGIST- DR HOCHREIN--  LAST VISIT 09-27-2011 IN EPIC   Non obstructive Cath 2010-  HX CORONARY SPASM 2004  . Cataract    bilateral   . GERD (gastroesophageal reflux disease)   . H/O hiatal hernia   . Heart murmur   . Heartburn   . High cholesterol   . History of nephrolithiasis 2009  . Hx of adenomatous colonic polyps   . Hyperlipidemia   . Hypertension   . Movement disorder   . Multiple sclerosis ([place]) DX  10/10---  NEUROLOGIST  DR Delfino Lovett [place] ([place])   RIGHT SIDE AFFECTED Fowler W/ WEAKNESS- - USES CANE  . Neuromuscular disorder (HCC)    MS  . NIDDM (non-insulin dependent diabetes mellitus)   . Peyronie's disease   . RBBB   . Renal stone RIGHT  .  Rosacea   . Vision abnormalities     PAST SURGICAL HISTORY: Past Surgical History:  Procedure Laterality Date  . CARDIAC CATHETERIZATION  02-26-2009 ---  DR Ida Rogue   NONOBSTRUCTIVE CAD/ 50% DISTAL RCA/ 30% LAD / NORMAL LVF  . CYSTOSCOPY W/ RETROGRADES  10/30/2011   Procedure: CYSTOSCOPY WITH RETROGRADE PYELOGRAM;  Surgeon: Claybon Jabs, MD;  Location: [facility];  Service: Urology;  Laterality: Right;  . CYSTOSCOPY WITH LITHOLAPAXY  10/30/2011   Procedure: CYSTOSCOPY WITH LITHOLAPAXY;  Surgeon: Claybon Jabs, MD;  Location: [facility];  Service: Urology;  Laterality: N/A;  . EXTRACORPOREAL SHOCK WAVE LITHOTRIPSY  09-05-2010   RIGHT  . EYE SURGERY Right 08/30/2018  . NESBIT PROCEDURE  10-17-2004   CORRECTION OF PENILE ANGULATION (PEYRONIES DISEASE)    FAMILY HISTORY: Family History  Problem Relation Age of Onset  . Stroke Mother   . Heart attack Mother   . Diabetes Mother   . Hypertension Mother   . Renal cancer Father   . Diabetes Sister   . Heart attack Brother   . Diabetes Son   . Heart attack Maternal Grandfather   . Heart attack Brother   . Bladder Cancer Brother     SOCIAL HISTORY:  Social History   Socioeconomic History  . Marital status: Married    Spouse name: Hassan Rowan  . Number of children: 2  . Years of education: 6  . Highest education level: Some college, no degree  Occupational History  . Occupation: retired    Comment: self -employed farmer  Tobacco Use  . Smoking status: Former Smoker    Packs/day: 1.00    Years: 7.00    Pack years: 7.00    Types: Cigarettes    Quit date: 09/26/1973    Years since quitting: 46.9  . Smokeless tobacco: Current User    Types: Chew  . Tobacco comment: Chewing tobacco  Vaping Use  . Vaping Use: Never used  Substance and Sexual Activity  . Alcohol use: No    Alcohol/week: 0.0  standard drinks  . Drug use: No  . Sexual activity: Not on file  Other Topics Concern  . Not  on file  Social History Narrative  . Not on file   Social Determinants of Health   Financial Resource Strain: Not on file  Food Insecurity: Not on file  Transportation Needs: Not on file  Physical Activity: Not on file  Stress: Not on file  Social Connections: Not on file  Intimate Partner Violence: Not on file     PHYSICAL EXAM  Vitals:   08/16/20 0822  BP: (!) 103/57  Pulse: 68  SpO2: 98%  Weight: 159 lb (72.1 kg)  Height: _0  (1.676 m)    Body mass index is 25.66 kg/m.   General: The patient is well-developed and well-nourished and in no acute distress  Neurologic Exam  Mental status: The patient is alert and oriented x 3 at the time of the examination. The patient has apparent normal recent and remote memory, with an apparently normal attention span and concentration ability.   Speech is normal.  Cranial nerves: Extraocular movements are full.   There is good facial sensation to soft touch bilaterally.Facial strength is normal.  Trapezius and sternocleidomastoid strength is normal. No dysarthria is noted.  The tongue is midline, and the patient has symmetric elevation of the soft palate. No obvious hearing deficits are noted.  Motor:  Muscle bulk is normal.   Increased muscle tone in the legs, right greater than left.  The strength was 2/5 in the right and 4/5 in the left hip flexures. Strength was 3/5 in the right quad and 4+/5 on the left. Distally strength was 3 in the right leg and 4 in the left leg. Strength was 5/5 in the arms.     Sensory: Sensory testing is intact to touch and vibration sensation in all 4 extremities.  Coordination: Cerebellar testing shows good finger-nose-finger.  He has reduced heel-to-shin on the left and is unable to do on the right  Gait and station: Station is normal.  The gait is spastic and wide.  He has a fairly severe right foot drop that is helped by the AFO.  The left leg is mildly weak as he walks  The right leg is also  externally rotated with a hyperextended knee as he walks.     Romberg is negative..   Reflexes:  Deep tendon reflexes were normal in the arms.  Deep tendon reflexes are increased in both legs, right > left     DIAGNOSTIC DATA (LABS, IMAGING, TESTING) - I reviewed patient records, labs, notes, testing and imaging myself where available.  Lab Results  Component Value Date   WBC 4.8 07/28/2020   HGB 12.0 (L) 07/28/2020   HCT 35.4 (L) 07/28/2020   MCV 87 07/28/2020   PLT 181 07/28/2020      Component Value Date/Time   NA 141 07/28/2020 0811   K 4.2 07/28/2020 0811   CL 103 07/28/2020 0811   CO2 23 07/28/2020 0811   GLUCOSE 122 (H) 07/28/2020 0811   GLUCOSE 130 (H) 01/06/2013 1311   BUN 23 07/28/2020 0811   CREATININE 1.08 07/28/2020 0811   CREATININE 1.13 01/06/2013 1311   CALCIUM 10.1 07/28/2020 0811   PROT 6.8 07/28/2020 0811   ALBUMIN 4.5 07/28/2020 0811   AST 21 07/28/2020 0811   ALT 16 07/28/2020 0811   ALKPHOS 53 07/28/2020 0811   BILITOT 0.4 07/28/2020 0811   GFRNONAA 69 07/28/2020 0811   GFRNONAA  69 01/06/2013 1311   GFRAA 80 07/28/2020 0811   GFRAA 80 01/06/2013 1311   Lab Results  Component Value Date   CHOL 131 07/28/2020   HDL 25 (L) 07/28/2020   LDLCALC 78 07/28/2020   TRIG 158 (H) 07/28/2020   CHOLHDL 5.2 (H) 07/28/2020   Lab Results  Component Value Date   HGBA1C 6.6 07/28/2020        ASSESSMENT AND PLAN  Multiple sclerosis ([place]) - Plan: MR BRAIN W WO CONTRAST  Urinary dysfunction  Spastic gait  Right foot drop  Other fatigue   1.   For now, he will continue Betaseron and change to twice weekly.   We will consider d/c DMT in the future assuming MRI's continue to show no new lesions  2.   Continue desmopressin 0.2 mg 3.   Continue to use a walker and right AFO.   Take care at night.  4.  He will return to see me in 6 months or sooner if he has new or worsening neurologic symptoms.   Richard A. Felecia Shelling, MD, PhD 6/31/4970, 2:63  AM Certified in Neurology, Clinical Neurophysiology, Sleep Medicine, Pain Medicine and Neuroimaging  [facility] [address] [place], [place] [address] [phone]

## 2020-08-16 NOTE — Telephone Encounter (Signed)
health team order sent to GI. No auth they will reach out to the patient to schedule.  

## 2020-08-23 ENCOUNTER — Other Ambulatory Visit: Payer: PPO

## 2020-08-25 ENCOUNTER — Other Ambulatory Visit: Payer: PPO

## 2020-08-30 ENCOUNTER — Other Ambulatory Visit: Payer: Self-pay

## 2020-08-30 ENCOUNTER — Ambulatory Visit (INDEPENDENT_AMBULATORY_CARE_PROVIDER_SITE_OTHER): Payer: PPO | Admitting: Urology

## 2020-08-30 ENCOUNTER — Encounter: Payer: Self-pay | Admitting: Urology

## 2020-08-30 VITALS — BP 138/73 | HR 85 | Temp 98.0°F | Ht 66.0 in | Wt 159.0 lb

## 2020-08-30 DIAGNOSIS — N200 Calculus of kidney: Secondary | ICD-10-CM

## 2020-08-30 DIAGNOSIS — R351 Nocturia: Secondary | ICD-10-CM | POA: Diagnosis not present

## 2020-08-30 DIAGNOSIS — R972 Elevated prostate specific antigen [PSA]: Secondary | ICD-10-CM | POA: Diagnosis not present

## 2020-08-30 HISTORY — DX: Calculus of kidney: N20.0

## 2020-08-30 LAB — MICROSCOPIC EXAMINATION
Bacteria, UA: NONE SEEN
Epithelial Cells (non renal): NONE SEEN /hpf (ref 0–10)
RBC, Urine: >30 /hpf — AB (ref 0–2)
Renal Epithel, UA: NONE SEEN /hpf
WBC, UA: NONE SEEN /hpf (ref 0–5)

## 2020-08-30 LAB — URINALYSIS, ROUTINE W REFLEX MICROSCOPIC
Bilirubin, UA: NEGATIVE
Glucose, UA: NEGATIVE
Leukocytes,UA: NEGATIVE
Nitrite, UA: NEGATIVE
Specific Gravity, UA: 1.025 (ref 1.005–1.030)
Urobilinogen, Ur: 0.2 mg/dL (ref 0.2–1.0)
pH, UA: 5 (ref 5.0–7.5)

## 2020-08-30 LAB — BLADDER SCAN AMB NON-IMAGING: Scan Result: 24

## 2020-08-30 MED ORDER — ALFUZOSIN HCL ER 10 MG PO TB24: 10.0000 mg | ORAL_TABLET | Freq: Every day | ORAL | 11 refills | Status: DC

## 2020-08-30 NOTE — Patient Instructions (Signed)

## 2020-08-30 NOTE — Progress Notes (Signed)
08/30/2020 10:54 AM   Jacob Fowler Oct 06, 1950 [id number]  Referring provider: [name], [place],  [place] [address]  followup elevated PSA and nocturia  HPI: Jacob Fowler is a 70yo here for followup for elevated PSA and BPH. No recent PSA. He has a good urinary stream, no hesitancy, no urgency or frequency. Nocturia increased to 3x. He is on desmopressin 0.36m and flomax 0.496m Today he has >30 RBCS in his urine. He has intermittent right flank pain for the past couple of months. He has a hx of nephrolithiasis.   PMH: Past Medical History:  Diagnosis Date  . Acquired leg length discrepancy   . Bladder stone   . BPH associated with nocturia   . CAD (coronary artery disease) CARDIOLOGIST- DR HOCHREIN--  LAST VISIT 09-27-2011 IN EPIC   Non obstructive Cath 2010-  HX CORONARY SPASM 2004  . Cataract    bilateral   . GERD (gastroesophageal reflux disease)   . H/O hiatal hernia   . Heart murmur   . Heartburn   . High cholesterol   . History of nephrolithiasis 2009  . Hx of adenomatous colonic polyps   . Hyperlipidemia   . Hypertension   . Movement disorder   . [place]  10/[place]---  NEUROLOGIST  DR RIDelfino LovettATER ([place])   RIGHT SIDE AFFECTED MORE W/ WEAKNESS- - USES CANE  . Neuromuscular disorder (HCC)    MS  . NIDDM (non-insulin dependent diabetes mellitus)   . Peyronie's disease   . RBBB   . Renal stone RIGHT  . Rosacea   . Vision abnormalities     Surgical History: Past Surgical History:  Procedure Laterality Date  . CARDIAC CATHETERIZATION  02-26-2009 ---  DR TIIda Rogue NONOBSTRUCTIVE CAD/ 50% DISTAL RCA/ 30% LAD / NORMAL LVF  . CYSTOSCOPY W/ RETROGRADES  10/30/2011   Procedure: CYSTOSCOPY WITH RETROGRADE PYELOGRAM;  Surgeon: MaClaybon JabsMD;  [facility];  Laterality: Right;  . CYSTOSCOPY WITH LITHOLAPAXY  10/30/2011   Procedure: CYSTOSCOPY WITH LITHOLAPAXY;   Surgeon: MaClaybon JabsMD;  [facility];  Laterality: N/A;  . EXTRACORPOREAL SHOCK WAVE LITHOTRIPSY  09-05-2010   RIGHT  . EYE SURGERY Right 08/30/2018  . NESBIT PROCEDURE  10-17-2004   CORRECTION OF PENILE ANGULATION (PEYRONIES DISEASE)    Home Medications:  Allergies as of 08/30/2020   No Known Allergies     Medication List       Accurate as of August 30, 2020 10:54 AM. If you have any questions, ask your nurse or doctor.        amLODipine 2.5 MG tablet Commonly known as: NORVASC Take 1 tablet (2.5 mg total) by mouth daily.   aspirin 81 MG EC tablet Take 81 mg by mouth daily.   Betaseron 0.3 MG Kit injection Generic drug: Interferon Beta-1b Inject 0.25 mg into the skin every other day.   cholecalciferol 1000 units tablet Commonly known as: VITAMIN D Take 1,000 Units by mouth daily.   cyanocobalamin 1000 MCG/ML injection Commonly known as: (VITAMIN B-12) INJECT 1ML INTO THE SKIN ONCE MONTH   desmopressin 0.2 MG tablet Commonly known as: DDAVP Take 1 tablet (0.2 mg total) by mouth daily.   doxycycline 100 MG capsule Commonly known as: VIBRAMYCIN Take 1 capsule (100 mg total) by mouth daily.   fish oil-omega-3 fatty acids 1000 MG capsule Take 2 g  by mouth 2 (two) times daily.   glucose blood test strip Use as instructed   isosorbide mononitrate 60 MG 24 hr tablet Commonly known as: IMDUR Take 0.5 tablets (30 mg total) by mouth daily.   levothyroxine 50 MCG tablet Commonly known as: SYNTHROID Take 1 tablet (50 mcg total) by mouth daily before breakfast.   lisinopril 10 MG tablet Commonly known as: ZESTRIL Take 1 tablet (10 mg total) by mouth daily.   metFORMIN 1000 MG tablet Commonly known as: GLUCOPHAGE Take 1 tablet (1,000 mg total) by mouth 2 (two) times daily with a meal.   omeprazole 20 MG capsule Commonly known as: PRILOSEC Take 1 capsule (20 mg total) by mouth daily.   pioglitazone 30 MG  tablet Commonly known as: ACTOS Take 1 tablet (30 mg total) by mouth daily.   rosuvastatin 10 MG tablet Commonly known as: Crestor Take 1 tablet (10 mg total) by mouth at bedtime.   sildenafil 100 MG tablet Commonly known as: VIAGRA Take 1 tablet (100 mg total) by mouth daily as needed for erectile dysfunction.   SYRINGE-NEEDLE (DISP) 3 ML 25G X 5/8" 3 ML Misc Commonly known as: B-D SYRINGE/NEEDLE 3CC/25GX5/8 Use to give once a month B12 injections   tamsulosin 0.4 MG Caps capsule Commonly known as: FLOMAX Take 1 capsule (0.4 mg total) by mouth daily after supper.       Allergies: No Known Allergies  Family History: Family History  Problem Relation Age of Onset  . Stroke Mother   . Heart attack Mother   . Diabetes Mother   . Hypertension Mother   . Renal cancer Father   . Diabetes Sister   . Heart attack Brother   . Diabetes Son   . Heart attack Maternal Grandfather   . Heart attack Brother   . Bladder Cancer Brother     Social History:  reports that he quit smoking about 46 years ago. His smoking use included cigarettes. He has a 7.00 pack-year smoking history. His smokeless tobacco use includes chew. He reports that he does not drink alcohol and does not use drugs.  ROS: All other review of systems were reviewed and are negative except what is noted above in HPI  Physical Exam: BP 138/73   Pulse 85   Temp 98 F (36.7 C)   Ht _0  (1.676 m)   Wt 159 lb (72.1 kg)   BMI 25.66 kg/m   Constitutional:  Alert and oriented, No acute distress. HEENT: [place] AT, moist mucus membranes.  Trachea midline, no masses. Cardiovascular: No clubbing, cyanosis, or edema. Respiratory: Normal respiratory effort, no increased work of breathing. GI: Abdomen is soft, nontender, nondistended, no abdominal masses GU: No CVA tenderness.  Lymph: No cervical or inguinal lymphadenopathy. Skin: No rashes, bruises or suspicious lesions. Neurologic: Grossly intact, no focal deficits,  moving all 4 extremities. Psychiatric: Normal mood and affect.  Laboratory Data: Lab Results  Component Value Date   WBC 4.8 07/28/2020   HGB 12.0 (L) 07/28/2020   HCT 35.4 (L) 07/28/2020   MCV 87 07/28/2020   PLT 181 07/28/2020    Lab Results  Component Value Date   CREATININE 1.08 07/28/2020    Lab Results  Component Value Date   PSA 5.5 (H) 01/28/2014   PSA 3.37 01/06/2013   PSA 3.08 10/14/2012    No results found for: TESTOSTERONE  Lab Results  Component Value Date   HGBA1C 6.6 07/28/2020    Urinalysis    Component Value Date/Time  COLORURINE YELLOW 04/06/2010 0509   APPEARANCEUR Clear 08/30/2020 1013   LABSPEC 1.026 04/06/2010 0509   PHURINE 5.0 04/06/2010 0509   GLUCOSEU Negative 08/30/2020 [place] 04/06/2010 0509   BILIRUBINUR Negative 08/30/2020 1013   KETONESUR 15 (A) 04/06/2010 0509   PROTEINUR 2+ (A) 08/30/2020 1013   PROTEINUR 30 (A) 04/06/2010 0509   UROBILINOGEN negative 12/18/2014 1601   UROBILINOGEN 1.0 04/06/2010 0509   NITRITE Negative 08/30/2020 1013   NITRITE NEGATIVE 04/06/2010 0509   LEUKOCYTESUR Negative 08/30/2020 1013    Lab Results  Component Value Date   LABMICR See below: 08/30/2020   WBCUA None seen 08/30/2020   RBCUA >30 (A) 08/07/2018   LABEPIT None seen 08/30/2020   MUCUS Present 08/30/2020   BACTERIA None seen 08/30/2020    Pertinent Imaging:  Results for orders placed during the hospital encounter of 10/30/11  KUB day of procedure  Narrative *RADIOLOGY REPORT*  Clinical Data: Right ureteral stone  ABDOMEN - 1 VIEW  Comparison: [facility] CT urogram dated 10/17/2011  Findings: Prior distal right ureteral calculus is not definitely visualized on the current radiograph.  Known nonobstructing right lower pole calculi are also not radiographically apparent.  Calcified phleboliths in the pelvis.  Linear calcification overlying the right sacrum corresponds to a vascular  calcification.  Suspected radiopaque ingested tablets in the splenic flexure, sigmoid colon (overlying superior bladder), and cecum.  Degenerative changes of the visualized thoracolumbar spine.  IMPRESSION: Prior distal right ureteral calculus is not definitely visualized on the current radiograph.  Known nonobstructing right lower pole calculi are also not radiographically apparent.  Original Report Authenticated By: Julian Hy, M.D.  No results found for this or any previous visit.  No results found for this or any previous visit.  No results found for this or any previous visit.  No results found for this or any previous visit.  No results found for this or any previous visit.  No results found for this or any previous visit.  No results found for this or any previous visit.   Assessment & Plan:    1. Elevated PSA -PSa today - Urinalysis, Routine w reflex microscopic - BLADDER SCAN AMB NON-IMAGING  2. Nocturia -We will switch flomax to uroxatral and patient will stop desmopressin   3. Nephrolithiasis -renal US, will call with results   No follow-ups on file.  Nicolette Bang, MD  [facility] [place]

## 2020-08-30 NOTE — Progress Notes (Signed)
Bladder Scan Patient can void: 24 ml Performed By: Durenda Guthrie, lpn    Urological Symptom Review  Patient is experiencing the following symptoms: Frequent urination Hard to postpone urination Get up at night to urinate Blood in urine Erection problems (male only)  Kidney stones   Review of Systems  Gastrointestinal (upper)  : Negative for upper GI symptoms  Gastrointestinal (lower) : Negative for lower GI symptoms  Constitutional : Negative for symptoms  Skin: Negative for skin symptoms  Eyes: Negative for eye symptoms  Ear/Nose/Throat : Negative for Ear/Nose/Throat symptoms  Hematologic/Lymphatic: Negative for Hematologic/Lymphatic symptoms  Cardiovascular : Negative for cardiovascular symptoms  Respiratory : Negative for respiratory symptoms  Endocrine: Negative for endocrine symptoms  Musculoskeletal: Negative for musculoskeletal symptoms  Neurological: Negative for neurological symptoms  Psychologic: Negative for psychiatric symptoms

## 2020-08-31 ENCOUNTER — Other Ambulatory Visit: Payer: Self-pay | Admitting: *Deleted

## 2020-08-31 MED ORDER — CYANOCOBALAMIN 1000 MCG/ML IJ SOLN: INTRAMUSCULAR | 1 refills | Status: DC

## 2020-09-02 ENCOUNTER — Other Ambulatory Visit: Payer: Self-pay

## 2020-09-02 ENCOUNTER — Ambulatory Visit
Admission: RE | Admit: 2020-09-02 | Discharge: 2020-09-02 | Disposition: A | Discharge status: Home / Self Care | Payer: PPO | Source: Ambulatory Visit | Attending: Neurology | Admitting: Neurology

## 2020-09-02 DIAGNOSIS — G35 Multiple sclerosis: Secondary | ICD-10-CM | POA: Diagnosis not present

## 2020-09-02 MED ORDER — GADOBENATE DIMEGLUMINE 529 MG/ML IV SOLN
15.0000 mL | Freq: Once | INTRAVENOUS | Status: AC | PRN
  Administered 2020-09-02: 15 mL via INTRAVENOUS

## 2020-09-03 ENCOUNTER — Telehealth: Payer: Self-pay | Admitting: *Deleted

## 2020-09-03 NOTE — Telephone Encounter (Signed)
-----   Message from Britt Bottom, MD sent at 09/03/2020 12:13 PM EST ----- Please let him know that the MRI looks okay.  There are no new MS lesions

## 2020-09-03 NOTE — Telephone Encounter (Signed)
Called and spoke with pt about results per Dr. Felecia Shelling note. Pt verbalized understanding.

## 2020-09-30 ENCOUNTER — Telehealth: Payer: Self-pay | Admitting: Neurology

## 2020-09-30 NOTE — Telephone Encounter (Signed)
Pt called needing to speak to the RN regarding his  BETASERON 0.3 MG KIT injection Pt states that he is not able to walk the next morning after the injection. This has happened with the last 3 he has had. Please advise.

## 2020-09-30 NOTE — Telephone Encounter (Signed)
Called pt back. Relayed Dr. Garth Bigness recommendation. He is agreeable to this plan. He will call back if he continues to not tolerate Betaseron, even at once a week. He is aware we will bring him in sooner for appt to discuss other DMT options at that point.

## 2020-09-30 NOTE — Telephone Encounter (Signed)
Spoke w/ Dr. Felecia Shelling. He recommends he change from twice a week to once a week for Betaseron. He will discuss other options at next follow up if he is still not tolerating well at that point.

## 2020-10-08 ENCOUNTER — Other Ambulatory Visit: Payer: Self-pay

## 2020-10-08 ENCOUNTER — Other Ambulatory Visit: Payer: PPO

## 2020-10-08 DIAGNOSIS — R972 Elevated prostate specific antigen [PSA]: Secondary | ICD-10-CM | POA: Diagnosis not present

## 2020-10-09 LAB — PSA, TOTAL AND FREE
PSA, Free Pct: 23.3 %
PSA, Free: 1.26 ng/mL
Prostate Specific Ag, Serum: 5.4 ng/mL — ABNORMAL HIGH (ref 0.0–4.0)

## 2020-10-09 LAB — PSA: Prostate Specific Ag, Serum: 5.3 ng/mL — ABNORMAL HIGH (ref 0.0–4.0)

## 2020-10-13 ENCOUNTER — Ambulatory Visit (HOSPITAL_COMMUNITY)
Admission: RE | Admit: 2020-10-13 | Discharge: 2020-10-13 | Disposition: A | Discharge status: Home / Self Care | Payer: PPO | Source: Ambulatory Visit | Attending: Urology | Admitting: Urology

## 2020-10-13 ENCOUNTER — Other Ambulatory Visit: Payer: Self-pay

## 2020-10-13 DIAGNOSIS — N1330 Unspecified hydronephrosis: Secondary | ICD-10-CM | POA: Diagnosis not present

## 2020-10-13 DIAGNOSIS — N200 Calculus of kidney: Secondary | ICD-10-CM | POA: Diagnosis not present

## 2020-10-15 ENCOUNTER — Other Ambulatory Visit: Payer: Self-pay

## 2020-10-15 ENCOUNTER — Encounter: Payer: Self-pay | Admitting: Urology

## 2020-10-15 ENCOUNTER — Ambulatory Visit (INDEPENDENT_AMBULATORY_CARE_PROVIDER_SITE_OTHER): Payer: PPO | Admitting: Urology

## 2020-10-15 VITALS — BP 93/55 | HR 75 | Temp 98.3°F | Ht 66.0 in | Wt 159.0 lb

## 2020-10-15 DIAGNOSIS — R972 Elevated prostate specific antigen [PSA]: Secondary | ICD-10-CM | POA: Diagnosis not present

## 2020-10-15 DIAGNOSIS — N200 Calculus of kidney: Secondary | ICD-10-CM | POA: Diagnosis not present

## 2020-10-15 DIAGNOSIS — R351 Nocturia: Secondary | ICD-10-CM

## 2020-10-15 LAB — MICROSCOPIC EXAMINATION: RBC, Urine: >30 /hpf — AB (ref 0–2)

## 2020-10-15 LAB — URINALYSIS, ROUTINE W REFLEX MICROSCOPIC
Bilirubin, UA: NEGATIVE
Glucose, UA: NEGATIVE
Leukocytes,UA: NEGATIVE
Nitrite, UA: NEGATIVE
Specific Gravity, UA: 1.025 (ref 1.005–1.030)
Urobilinogen, Ur: 0.2 mg/dL (ref 0.2–1.0)
pH, UA: 5.5 (ref 5.0–7.5)

## 2020-10-15 MED ORDER — ALFUZOSIN HCL ER 10 MG PO TB24: 10.0000 mg | ORAL_TABLET | Freq: Two times a day (BID) | ORAL | 11 refills | Status: DC

## 2020-10-15 NOTE — Patient Instructions (Signed)

## 2020-10-15 NOTE — Progress Notes (Signed)
10/15/2020 10:51 AM   Jacob Fowler Aug 10, 1950 [id number]  Referring provider: [name], [place],  [place] [address]  followup microhematuria and BPH  HPI: [name] is a 70yo here for followup for microhematuria and BPH. Renal US 3/30 shows multiple bilateral renal calculi.  No flank pain currently. He does not significant improvement in his LUTS after starting uroxatral 21m qhs. Stream strong. Nocturia 1x. No urinary urgency or frequency, Overall he is happy with is urination   PMH: Past Medical History:  Diagnosis Date  . Acquired leg length discrepancy   . Bladder stone   . BPH associated with nocturia   . CAD (coronary artery disease) CARDIOLOGIST- DR HOCHREIN--  LAST VISIT 09-27-2011 IN [facility]   Non obstructive Cath 2010-  HX CORONARY SPASM 2004  . Cataract    bilateral   . GERD (gastroesophageal reflux disease)   . H/O hiatal hernia   . Heart murmur   . Heartburn   . High cholesterol   . History of nephrolithiasis 2009  . Hx of adenomatous colonic polyps   . Hyperlipidemia   . Hypertension   . Movement disorder   . [place] ([place]  10/10---  NEUROLOGIST  DR RDelfino LovettFATER ([place])   RIGHT SIDE AFFECTED MORE W/ WEAKNESS- - USES CANE  . Neuromuscular disorder (HCC)    MS  . NIDDM (non-insulin dependent diabetes mellitus)   . Peyronie's disease   . RBBB   . Renal stone RIGHT  . Rosacea   . Vision abnormalities     Surgical History: Past Surgical History:  Procedure Laterality Date  . CARDIAC CATHETERIZATION  02-26-2009 ---  DR TIda Rogue  NONOBSTRUCTIVE CAD/ 50% DISTAL RCA/ 30% LAD / NORMAL LVF  . CYSTOSCOPY W/ RETROGRADES  10/30/2011   Procedure: CYSTOSCOPY WITH RETROGRADE PYELOGRAM;  Surgeon: MClaybon Jabs MD;  [facility]  Service: Urology;  Laterality: Right;  . CYSTOSCOPY WITH LITHOLAPAXY  10/30/2011   Procedure: CYSTOSCOPY WITH LITHOLAPAXY;  Surgeon: MClaybon Jabs MD;   [facility]  Service: Urology;  Laterality: N/A;  . EXTRACORPOREAL SHOCK WAVE LITHOTRIPSY  09-05-2010   RIGHT  . EYE SURGERY Right 08/30/2018  . NESBIT PROCEDURE  10-17-2004   CORRECTION OF PENILE ANGULATION (PEYRONIES DISEASE)    Home Medications:  Allergies as of 10/15/2020   No Known Allergies     Medication List       Accurate as of October 15, 2020 10:51 AM. If you have any questions, ask your nurse or doctor.        alfuzosin 10 MG 24 hr tablet Commonly known as: UROXATRAL Take 1 tablet (10 mg total) by mouth daily with breakfast.   amLODipine 2.5 MG tablet Commonly known as: NORVASC Take 1 tablet (2.5 mg total) by mouth daily.   aspirin 81 MG EC tablet Take 81 mg by mouth daily.   Betaseron 0.3 MG Kit injection Generic drug: Interferon Beta-1b Inject 0.25 mg into the skin every other day.   cholecalciferol 1000 units tablet Commonly known as: VITAMIN D Take 1,000 Units by mouth daily.   cyanocobalamin 1000 MCG/ML injection Commonly known as: (VITAMIN B-12) INJECT 1ML INTO THE SKIN ONCE MONTH   desmopressin 0.2 MG tablet Commonly known as: DDAVP Take 1 tablet (0.2 mg total) by mouth daily.   doxycycline 100 MG capsule Commonly known as: VIBRAMYCIN Take 1 capsule (100 mg total) by mouth daily.  fish oil-omega-3 fatty acids 1000 MG capsule Take 2 g by mouth 2 (two) times daily.   glucose blood test strip Use as instructed   isosorbide mononitrate 60 MG 24 hr tablet Commonly known as: IMDUR Take 0.5 tablets (30 mg total) by mouth daily.   levothyroxine 50 MCG tablet Commonly known as: SYNTHROID Take 1 tablet (50 mcg total) by mouth daily before breakfast.   lisinopril 10 MG tablet Commonly known as: ZESTRIL Take 1 tablet (10 mg total) by mouth daily.   metFORMIN 1000 MG tablet Commonly known as: GLUCOPHAGE Take 1 tablet (1,000 mg total) by mouth 2 (two) times daily with a meal.   omeprazole 20 MG capsule Commonly known  as: PRILOSEC Take 1 capsule (20 mg total) by mouth daily.   pioglitazone 30 MG tablet Commonly known as: ACTOS Take 1 tablet (30 mg total) by mouth daily.   rosuvastatin 10 MG tablet Commonly known as: Crestor Take 1 tablet (10 mg total) by mouth at bedtime.   sildenafil 100 MG tablet Commonly known as: VIAGRA Take 1 tablet (100 mg total) by mouth daily as needed for erectile dysfunction.   SYRINGE-NEEDLE (DISP) 3 ML 25G X 5/8" 3 ML Misc Commonly known as: B-D SYRINGE/NEEDLE 3CC/25GX5/8 Use to give once a month B12 injections   tamsulosin 0.4 MG Caps capsule Commonly known as: FLOMAX Take 1 capsule (0.4 mg total) by mouth daily after supper.       Allergies: No Known Allergies  Family History: Family History  Problem Relation Age of Onset  . Stroke Mother   . Heart attack Mother   . Diabetes Mother   . Hypertension Mother   . Renal cancer Father   . Diabetes Sister   . Heart attack Brother   . Diabetes Son   . Heart attack Maternal Grandfather   . Heart attack Brother   . Bladder Cancer Brother     Social History:  reports that he quit smoking about 47 years ago. His smoking use included cigarettes. He has a 7.00 pack-year smoking history. His smokeless tobacco use includes chew. He reports that he does not drink alcohol and does not use drugs.  ROS: All other review of systems were reviewed and are negative except what is noted above in HPI  Physical Exam: BP (!) 93/55   Pulse 75   Temp 98.3 F (36.8 C)   Ht 5' 6"  (1.676 m)   Wt 159 lb (72.1 kg)   BMI 25.66 kg/m   Constitutional:  Alert and oriented, No acute distress. HEENT: [place] AT, moist mucus membranes.  Trachea midline, no masses. Cardiovascular: No clubbing, cyanosis, or edema. Respiratory: Normal respiratory effort, no increased work of breathing. GI: Abdomen is soft, nontender, nondistended, no abdominal masses GU: No CVA tenderness.  Lymph: No cervical or inguinal lymphadenopathy. Skin: No  rashes, bruises or suspicious lesions. Neurologic: Grossly intact, no focal deficits, moving all 4 extremities. Psychiatric: Normal mood and affect.  Laboratory Data: Lab Results  Component Value Date   WBC 4.8 07/28/2020   HGB 12.0 (L) 07/28/2020   HCT 35.4 (L) 07/28/2020   MCV 87 07/28/2020   PLT 181 07/28/2020    Lab Results  Component Value Date   CREATININE 1.08 07/28/2020    Lab Results  Component Value Date   PSA 5.5 (H) 01/28/2014   PSA 3.37 01/06/2013   PSA 3.08 10/14/2012    No results found for: TESTOSTERONE  Lab Results  Component Value Date   HGBA1C 6.6  07/28/2020    Urinalysis    Component Value Date/Time   COLORURINE YELLOW 04/06/2010 0509   APPEARANCEUR Clear 08/30/2020 1013   LABSPEC 1.026 04/06/2010 0509   PHURINE 5.0 04/06/2010 0509   GLUCOSEU Negative 08/30/2020 1013   HGBUR NEGATIVE 04/06/2010 0509   BILIRUBINUR Negative 08/30/2020 1013   KETONESUR 15 (A) 04/06/2010 0509   PROTEINUR 2+ (A) 08/30/2020 1013   PROTEINUR 30 (A) 04/06/2010 0509   UROBILINOGEN negative 12/18/2014 1601   UROBILINOGEN 1.0 04/06/2010 0509   NITRITE Negative 08/30/2020 1013   NITRITE NEGATIVE 04/06/2010 0509   LEUKOCYTESUR Negative 08/30/2020 1013    Lab Results  Component Value Date   LABMICR See below: 08/30/2020   WBCUA None seen 08/30/2020   RBCUA >30 (A) 08/07/2018   LABEPIT None seen 08/30/2020   MUCUS Present 08/30/2020   BACTERIA None seen 08/30/2020    Pertinent Imaging: Renak[place] 10/13/2020: Images reviewed and discussed with the patient Results for orders placed during the hospital encounter of 10/30/11  KUB day of procedure  Narrative *RADIOLOGY REPORT*  Clinical Data: Right ureteral stone  ABDOMEN - 1 VIEW  Comparison: [facility] CT urogram dated 10/17/2011  Findings: Prior distal right ureteral calculus is not definitely visualized on the current radiograph.  Known nonobstructing right lower pole calculi are also not  radiographically apparent.  Calcified phleboliths in the pelvis.  Linear calcification overlying the right sacrum corresponds to a vascular calcification.  Suspected radiopaque ingested tablets in the splenic flexure, sigmoid colon (overlying superior bladder), and cecum.  Degenerative changes of the visualized thoracolumbar spine.  IMPRESSION: Prior distal right ureteral calculus is not definitely visualized on the current radiograph.  Known nonobstructing right lower pole calculi are also not radiographically apparent.  Original Report Authenticated By: Julian Hy, M.D.  No results found for this or any previous visit.  No results found for this or any previous visit.  No results found for this or any previous visit.  Results for orders placed in visit on 08/30/20  Ultrasound renal complete  Narrative CLINICAL DATA:  History of nephrolithiasis. History of right flank pain. History of lithotripsy.  EXAM: RENAL / URINARY TRACT ULTRASOUND COMPLETE  COMPARISON:  CT 03/27/2010.  FINDINGS: Right Kidney:  Renal measurements: 10.3 x 5.5 x 5.0 cm = volume: 146.5 mL. Echogenicity within normal limits. No mass or hydronephrosis visualized. Multiple nonobstructing stones, the largest measuring 1.3 cm.  Left Kidney:  Renal measurements: 11.4 x 5.0 x 5.4 cm = volume: 161.6 mL. Echogenicity within normal limits. No mass or hydronephrosis visualized. Multiple nonobstructing stones, the largest measures 1.6 cm.  Bladder:  No bladder distention. Mild bladder wall thickening noted. Active bladder pathology including cystitis cannot be excluded. Bilateral ureteral jets noted.  Other:  None.  IMPRESSION: 1.  Bilateral nonobstructing nephrolithiasis.  No hydronephrosis.  2. Mild bladder wall thickening noted. Active bladder pathology including cystitis cannot be excluded.   Electronically Signed By: Marcello Moores  Register On: 10/14/2020 07:54  No results found  for this or any previous visit.  No results found for this or any previous visit.  No results found for this or any previous visit.   Assessment & Plan:    1. Nocturia Continue uroxatral 10m - Urinalysis, Routine w reflex microscopic  2. Nephrolithiasis -We discussed the management of kidney stones. These options include observation, ureteroscopy, shockwave lithotripsy (ESWL) and percutaneous nephrolithotomy (PCNL). We discussed which options are relevant to the patient's stone(s). We discussed the natural history of kidney stones as well  as the complications of untreated stones and the impact on quality of life without treatment as well as with each of the above listed treatments. We also discussed the efficacy of each treatment in its ability to clear the stone burden. With any of these management options I discussed the signs and symptoms of infection and the need for emergent treatment should these be experienced. For each option we discussed the ability of each procedure to clear the patient of their stone burden.   For observation I described the risks which include but are not limited to silent renal damage, life-threatening infection, need for emergent surgery, failure to pass stone and pain.   For ureteroscopy I described the risks which include bleeding, infection, damage to contiguous structures, positioning injury, ureteral stricture, ureteral avulsion, ureteral injury, need for prolonged ureteral stent, inability to perform ureteroscopy, need for an interval procedure, inability to clear stone burden, stent discomfort/pain, heart attack, stroke, pulmonary embolus and the inherent risks with general anesthesia.   For shockwave lithotripsy I described the risks which include arrhythmia, kidney contusion, kidney hemorrhage, need for transfusion, pain, inability to adequately break up stone, inability to pass stone fragments, Steinstrasse, infection associated with obstructing stones,  need for alternate surgical procedure, need for repeat shockwave lithotripsy, MI, CVA, PE and the inherent risks with anesthesia/conscious sedation.   For PCNL I described the risks including positioning injury, pneumothorax, hydrothorax, need for chest tube, inability to clear stone burden, renal laceration, arterial venous fistula or malformation, need for embolization of kidney, loss of kidney or renal function, need for repeat procedure, need for prolonged nephrostomy tube, ureteral avulsion, MI, CVA, PE and the inherent risks of general anesthesia.   - The patient would like to proceed with Left ureteroscopic stone extraction followed in 2-3 weeks by right ureteroscopic extraction    No follow-ups on file.  Nicolette Bang, MD  [facility] [place]

## 2020-10-15 NOTE — Progress Notes (Signed)
Urological Symptom Review  Patient is experiencing the following symptoms: Hard to postpone urination Get up at night to urinate Blood in urine Erection problems (male only)  Kidney stones  Review of Systems  Gastrointestinal (upper)  : Negative for upper GI symptoms  Gastrointestinal (lower) : Negative for lower GI symptoms  Constitutional : Negative for symptoms  Skin: Negative for skin symptoms  Eyes: Negative for eye symptoms  Ear/Nose/Throat : Negative for Ear/Nose/Throat symptoms  Hematologic/Lymphatic: Negative for Hematologic/Lymphatic symptoms  Cardiovascular : Negative for cardiovascular symptoms  Respiratory : Negative for respiratory symptoms  Endocrine: Negative for endocrine symptoms  Musculoskeletal: Negative for musculoskeletal symptoms  Neurological: Negative for neurological symptoms  Psychologic: Negative for psychiatric symptoms

## 2020-11-03 ENCOUNTER — Telehealth: Payer: Self-pay | Admitting: Neurology

## 2020-11-03 NOTE — Telephone Encounter (Signed)
Pt called wanting to speak to the RN regarding his  BETASERON 0.3 MG KIT injection Pt states that after he injects the medication he has trouble walking and getting around up to 2 days after. Please advise.

## 2020-11-03 NOTE — Telephone Encounter (Signed)
Called pt back. He cannot walk the next morning after Betaseron injection. He states he has to crawl everywhere. Does not want to continue med. Scheduled work in appt for him to see Dr. Felecia Shelling on 11/24/20 at 2pm, check in 1:30pm.

## 2020-11-04 NOTE — Telephone Encounter (Signed)
Its ok for him to stop.   Will discuss further with him at next visit

## 2020-11-18 ENCOUNTER — Ambulatory Visit (INDEPENDENT_AMBULATORY_CARE_PROVIDER_SITE_OTHER): Payer: PPO

## 2020-11-18 VITALS — Ht 66.0 in | Wt 160.0 lb

## 2020-11-18 DIAGNOSIS — Z0000 Encounter for general adult medical examination without abnormal findings: Secondary | ICD-10-CM | POA: Diagnosis not present

## 2020-11-18 NOTE — Progress Notes (Signed)
Subjective:   Jacob Fowler is a 70 y.o. male who presents for Medicare Annual/Subsequent preventive examination.  Virtual Visit via Telephone Note  I connected with  DRAYLON MERCADEL on 11/18/20 at  1:15 PM EDT by telephone and verified that I am speaking with the correct person using two identifiers.  Location: Patient: Home Provider: [facility] Persons participating in the virtual visit: patient/Nurse Health Advisor   I discussed the limitations, risks, security and privacy concerns of performing an evaluation and management service by telephone and the availability of in person appointments. The patient expressed understanding and agreed to proceed.  Interactive audio and video telecommunications were attempted between this nurse and patient, however failed, due to patient having technical difficulties OR patient did not have access to video capability.  We continued and completed visit with audio only.  Some vital signs may be absent or patient reported.   Jacob Greb E Johara Lodwick, LPN   Review of Systems     Cardiac Risk Factors include: advanced age (>76mn, >>37women);diabetes mellitus     Objective:    Today's Vitals   11/18/20 1233 11/18/20 1234  Weight: 160 lb (72.6 kg)   Height: _0  (1.676 m)   PainSc:  5    Body mass index is 25.82 kg/m.  Advanced Directives 11/18/2020 11/18/2019 11/12/2018 07/05/2017 10/30/2011 10/25/2011  Does Patient Have a Medical Advance Directive? No Yes Yes Yes Patient has advance directive, copy not in chart Patient has advance directive, copy in chart  Type of Advance Directive - Living will [place] Living will;Healthcare Power of Attorney Living will Living will  Does patient want to make changes to medical advance directive? - No - Patient declined - No - Patient declined - -  Copy of [place]? - - No - copy requested No - copy requested Copy requested from family -  Would patient like  information on creating a medical advance directive? No - Patient declined - - - - -  Pre-existing out of facility DNR order (yellow form or pink MOST form) - - - - No -    Current Medications (verified) Outpatient Encounter Medications as of 11/18/2020  Medication Sig  . alfuzosin (UROXATRAL) 10 MG 24 hr tablet Take 1 tablet (10 mg total) by mouth in the morning and at bedtime.  .Marland KitchenamLODipine (NORVASC) 2.5 MG tablet Take 1 tablet (2.5 mg total) by mouth daily.  .Marland Kitchenaspirin 81 MG EC tablet Take 81 mg by mouth daily.  .Marland KitchenBETASERON 0.3 MG KIT injection Inject 0.25 mg into the skin every other day. (Patient taking differently: Inject 0.3 mg into the skin every 7 (seven) days.)  . cholecalciferol (VITAMIN D) 1000 UNITS tablet Take 1,000 Units by mouth daily.  . cyanocobalamin (,VITAMIN B-12,) 1000 MCG/ML injection INJECT 1ML INTO THE SKIN ONCE MONTH (Patient taking differently: Inject 1,000 mcg into the skin every 30 (thirty) days.)  . desmopressin (DDAVP) 0.2 MG tablet Take 1 tablet (0.2 mg total) by mouth daily. (Patient not taking: No sig reported)  . doxycycline (VIBRAMYCIN) 100 MG capsule Take 1 capsule (100 mg total) by mouth daily. (Patient taking differently: Take 100 mg by mouth every other day.)  . fish oil-omega-3 fatty acids 1000 MG capsule Take 1 g by mouth at bedtime.  .Marland Kitchenglucose blood test strip Use as instructed  . isosorbide mononitrate (IMDUR) 60 MG 24 hr tablet Take 0.5 tablets (30 mg total) by mouth daily.  .Marland Kitchen  levothyroxine (SYNTHROID) 50 MCG tablet Take 1 tablet (50 mcg total) by mouth daily before breakfast.  . lisinopril (ZESTRIL) 10 MG tablet Take 1 tablet (10 mg total) by mouth daily.  . metFORMIN (GLUCOPHAGE) 1000 MG tablet Take 1 tablet (1,000 mg total) by mouth 2 (two) times daily with a meal.  . omeprazole (PRILOSEC) 20 MG capsule Take 1 capsule (20 mg total) by mouth daily.  . pioglitazone (ACTOS) 30 MG tablet Take 1 tablet (30 mg total) by mouth daily.  . rosuvastatin  (CRESTOR) 10 MG tablet Take 1 tablet (10 mg total) by mouth at bedtime.  . sildenafil (VIAGRA) 100 MG tablet Take 1 tablet (100 mg total) by mouth daily as needed for erectile dysfunction. (Patient not taking: No sig reported)  . SYRINGE-NEEDLE, DISP, 3 ML (B-D SYRINGE/NEEDLE 3CC/25GX5/8) 25G X 5/8" 3 ML MISC Use to give once a month B12 injections  . tamsulosin (FLOMAX) 0.4 MG CAPS capsule Take 1 capsule (0.4 mg total) by mouth daily after supper.  . [DISCONTINUED] atorvastatin (LIPITOR) 10 MG tablet Take 10 mg by mouth daily.  . [DISCONTINUED] niacin-simvastatin (SIMCOR) 500-20 MG 24 hr tablet Take 1 tablet by mouth at bedtime.     No facility-administered encounter medications on file as of 11/18/2020.    Allergies (verified) Patient has no known allergies.   History: Past Medical History:  Diagnosis Date  . Acquired leg length discrepancy   . Bladder stone   . BPH associated with nocturia   . CAD (coronary artery disease) CARDIOLOGIST- DR HOCHREIN--  LAST VISIT 09-27-2011 IN EPIC   Non obstructive Cath 2010-  HX CORONARY SPASM 2004  . Cataract    bilateral   . GERD (gastroesophageal reflux disease)   . H/O hiatal hernia   . Heart murmur   . Heartburn   . High cholesterol   . History of nephrolithiasis 2009  . Hx of adenomatous colonic polyps   . Hyperlipidemia   . Hypertension   . Movement disorder   . Multiple sclerosis ([place]) DX  10/10---  NEUROLOGIST  DR Delfino Lovett [facility] ([place])   RIGHT SIDE AFFECTED MORE W/ WEAKNESS- - USES CANE  . Neuromuscular disorder (HCC)    MS  . NIDDM (non-insulin dependent diabetes mellitus)   . Peyronie's disease   . RBBB   . Renal stone RIGHT  . Rosacea   . Vision abnormalities    Past Surgical History:  Procedure Laterality Date  . CARDIAC CATHETERIZATION  02-26-2009 ---  DR Ida Rogue   NONOBSTRUCTIVE CAD/ 50% DISTAL RCA/ 30% LAD / NORMAL LVF  . CYSTOSCOPY W/ RETROGRADES  10/30/2011   Procedure: CYSTOSCOPY WITH RETROGRADE  PYELOGRAM;  Surgeon: Claybon Jabs, MD;  Location: [facility];  Service: Urology;  Laterality: Right;  . CYSTOSCOPY WITH LITHOLAPAXY  10/30/2011   Procedure: CYSTOSCOPY WITH LITHOLAPAXY;  Surgeon: Claybon Jabs, MD;  Location[facility];  Service: Urology;  Laterality: N/A;  . EXTRACORPOREAL SHOCK WAVE LITHOTRIPSY  09-05-2010   RIGHT  . EYE SURGERY Right 08/30/2018  . NESBIT PROCEDURE  10-17-2004   CORRECTION OF PENILE ANGULATION (PEYRONIES DISEASE)   Family History  Problem Relation Age of Onset  . Stroke Mother   . Heart attack Mother   . Diabetes Mother   . Hypertension Mother   . Renal cancer Father   . Diabetes Sister   . Heart attack Brother   . Diabetes Son   . Heart attack Maternal Grandfather   . Heart  attack Brother   . Bladder Cancer Brother    Social History   Socioeconomic History  . Marital status: Married    Spouse name: Hassan Rowan  . Number of children: 2  . Years of education: 73  . Highest education level: Some college, no degree  Occupational History  . Occupation: retired    Comment: self -employed farmer  Tobacco Use  . Smoking status: Former Smoker    Packs/day: 1.00    Years: 7.00    Pack years: 7.00    Types: Cigarettes    Quit date: 09/26/1973    Years since quitting: 47.1  . Smokeless tobacco: Current User    Types: Chew  . Tobacco comment: Chewing tobacco  Vaping Use  . Vaping Use: Never used  Substance and Sexual Activity  . Alcohol use: No    Alcohol/week: 0.0 standard drinks  . Drug use: No  . Sexual activity: Not on file  Other Topics Concern  . Not on file  Social History Narrative   Lives with wife, has MS, mobility issues.   Social Determinants of Health   Financial Resource Strain: Low Risk   . Difficulty of Paying Living Expenses: Not hard at all  Food Insecurity: No Food Insecurity  . Worried About Charity fundraiser in the Last Year: Never true  . Ran Out of Food in the Last Year: Never  true  Transportation Needs: No Transportation Needs  . Lack of Transportation (Medical): No  . Lack of Transportation (Non-Medical): No  Physical Activity: Insufficiently Active  . Days of Exercise per Week: 4 days  . Minutes of Exercise per Session: 20 min  Stress: No Stress Concern Present  . Feeling of Stress : Not at all  Social Connections: Moderately Integrated  . Frequency of Communication with Friends and Family: More than three times a week  . Frequency of Social Gatherings with Friends and Family: More than three times a week  . Attends Religious Services: More than 4 times per year  . Active Member of Clubs or Organizations: No  . Attends Archivist Meetings: Never  . Marital Status: Married    Tobacco Counseling Ready to quit: Not Answered Counseling given: Not Answered Comment: Chewing tobacco   Clinical Intake:  Pre-visit preparation completed: Yes  Pain : 0-10 Pain Score: 5  Pain Type: Chronic pain Pain Location: Back Pain Orientation: Lower Pain Descriptors / Indicators: Sharp Pain Onset: More than a month ago Pain Frequency: Intermittent     BMI - recorded: 25.82 Nutritional Status: BMI 25 -29 Overweight Nutritional Risks: None Diabetes: Yes CBG done?: No Did pt. bring in CBG monitor from home?: No  How often do you need to have someone help you when you read instructions, pamphlets, or other written materials from your doctor or pharmacy?: 1 - Never  Nutrition Risk Assessment:  Has the patient had any N/V/D within the last 2 months?  Yes  Does the patient have any non-healing wounds?  No  Has the patient had any unintentional weight loss or weight gain?  No   Diabetes:  Is the patient diabetic?  Yes  If diabetic, was a CBG obtained today?  No  Did the patient bring in their glucometer from home?  No  How often do you monitor your CBG's? Once a day.   Financial Strains and Diabetes Management:  Are you having any financial  strains with the device, your supplies or your medication? No .  Does the patient  want to be seen by Chronic Care Management for management of their diabetes?  No  Would the patient like to be referred to a Nutritionist or for Diabetic Management?  No   Diabetic Exams:  Diabetic Eye Exam: Completed 2021.  Diabetic Foot Exam: Completed 07/28/20. Pt has been advised about the importance in completing this exam. Pt is scheduled for diabetic foot exam on next year.    Interpreter Needed?: No  Information entered by :: Syrah Daughtrey, LPN   Activities of Daily Living In your present state of health, do you have any difficulty performing the following activities: 11/18/2020  Hearing? Y  Vision? N  Difficulty concentrating or making decisions? N  Walking or climbing stairs? Y  Dressing or bathing? N  Doing errands, shopping? N  Preparing Food and eating ? N  Using the Toilet? N  In the past six months, have you accidently leaked urine? N  Do you have problems with loss of bowel control? N  Managing your Medications? N  Managing your Finances? N  Housekeeping or managing your Housekeeping? N  Some recent data might be hidden    Patient Care Team: [name], FNP as PCP - General (Family Medicine) Sater, Nanine Means, MD (Neurology) Kathie Rhodes, MD (Inactive) as Consulting Physician (Urology) Minus Breeding, MD as Consulting Physician (Cardiology) Ilean China, RN as Registered Nurse  Indicate any recent Medical Services you may have received from other than [facility] providers in the past year (date may be approximate).     Assessment:   This is a routine wellness examination for [name].  Hearing/Vision screen  Hearing Screening   _0  _1  _2  _3  _4  _5  _6  _7  _8   Right ear:           Left ear:           Comments: Wears hearing aids his daughter bought for him online - no ENT  Vision Screening Comments: Annual visits with [facility] in [place] - up to  date with eye exam -wears eyeglasses  Dietary issues and exercise activities discussed: Current Exercise Habits: Home exercise routine, Time (Minutes): 20, Frequency (Times/Week): 5, Weekly Exercise (Minutes/Week): 100, Intensity: Mild, Exercise limited by: orthopedic condition(s)  Goals Addressed            This Visit's Progress   . Exercise 3x per week (30 min per time)   On track    Pt would like to "get around better"  Goals Addressed             This Visit's Progress   . Exercise 3x per week (30 min per time)   On track    Pt would like to "get around better"               Depression Screen PHQ 2/9 Scores 11/18/2020 07/28/2020 12/25/2019 11/18/2019 08/27/2019 04/25/2019 11/12/2018  PHQ - 2 Score 0 0 0 0 0 0 0    Fall Risk Fall Risk  11/18/2020 07/28/2020 12/25/2019 11/18/2019 08/27/2019  Falls in the past year? 1 0 1 1 0  Number falls in past yr: 1 - 1 1 -  Injury with Fall? 0 - 0 0 -  Risk for fall due to : Impaired balance/gait;History of fall(s);Orthopedic patient;Impaired vision - - - -  Follow up Falls prevention discussed;Education provided - Falls prevention discussed - -    FALL RISK PREVENTION PERTAINING TO THE HOME:  Any stairs in or around the home? No  If so, are there  any without handrails? No  Home free of loose throw rugs in walkways, pet beds, electrical cords, etc? Yes  Adequate lighting in your home to reduce risk of falls? Yes   ASSISTIVE DEVICES UTILIZED TO PREVENT FALLS:  Life alert? No  Use of a cane, walker or w/c? Yes  Grab bars in the bathroom? No  Shower chair or bench in shower? No  Elevated toilet seat or a handicapped toilet? No   TIMED UP AND GO:  Was the test performed? No . Telephonic visit.  Cognitive Function: Normal cognitive status assessed by direct observation by this Nurse Health Advisor. No abnormalities found.   MMSE - Mini Mental State Exam 07/05/2017  Orientation to time 5  Orientation to Place 5  Registration 3   Attention/ Calculation 5  Recall 2  Language- name 2 objects 2  Language- repeat 1  Language- follow 3 step command 3  Language- read & follow direction 1  Write a sentence 1  Copy design 1  Total score 29     6CIT Screen 11/18/2019 11/12/2018  What Year? 0 points 0 points  What month? 0 points 0 points  What time? 0 points 0 points  Count back from 20 0 points 0 points  Months in reverse 0 points 0 points  Repeat phrase 0 points 0 points  Total Score 0 0    Immunizations Immunization History  Administered Date(s) Administered  . Fluad Quad(high Dose 65+) 04/25/2019, 05/10/2020  . Influenza, High Dose Seasonal PF 04/10/2016, 05/17/2017, 06/10/2018  . Influenza,inj,Quad PF,6+ Mos 05/14/2013, 05/27/2014, 07/05/2015  . Pneumococcal Conjugate-13 05/27/2013  . Pneumococcal Polysaccharide-23 04/15/2010, 10/04/2017  . Td 02/16/2014  . Tdap 02/16/2014    TDAP status: Up to date  Flu Vaccine status: Up to date  Pneumococcal vaccine status: Up to date  Covid-19 vaccine status: Declined, Education has been provided regarding the importance of this vaccine but patient still declined. Advised may receive this vaccine at local pharmacy or Health Dept.or vaccine clinic. Aware to provide a copy of the vaccination record if obtained from local pharmacy or Health Dept. Verbalized acceptance and understanding.  Qualifies for Shingles Vaccine? No   Zostavax completed No   Shingrix Completed?: No.    Education has been provided regarding the importance of this vaccine. Patient has been advised to call insurance company to determine out of pocket expense if they have not yet received this vaccine. Advised may also receive vaccine at local pharmacy or Health Dept. Verbalized acceptance and understanding.  Screening Tests Health Maintenance  Topic Date Due  . COVID-19 Vaccine (1) Never done  . OPHTHALMOLOGY EXAM  08/31/2018  . Hepatitis C Screening  07/28/2021 (Originally August 08, 1950)  .  HEMOGLOBIN A1C  01/25/2021  . INFLUENZA VACCINE  02/14/2021  . FOOT EXAM  07/28/2021  . TETANUS/TDAP  02/17/2024  . COLONOSCOPY (Pts 45-42yr Insurance coverage will need to be confirmed)  01/01/2028  . PNA vac Low Risk Adult  Completed  . HPV VACCINES  Aged Out  . COLON CANCER SCREENING ANNUAL FOBT  Discontinued    Health Maintenance  Health Maintenance Due  Topic Date Due  . COVID-19 Vaccine (1) Never done  . OPHTHALMOLOGY EXAM  08/31/2018    Colorectal cancer screening: Type of screening: Colonoscopy. Completed 12/31/2017. Repeat every 10 years  Lung Cancer Screening: (Low Dose CT Chest recommended if Age 71-80years, 30 pack-year currently smoking OR have quit w/in 15years.) does not qualify.   Additional Screening:  Hepatitis C Screening: does  qualify; Due at next visit.  Vision Screening: Recommended annual ophthalmology exams for early detection of glaucoma and other disorders of the eye. Is the patient up to date with their annual eye exam?  Yes  Who is the provider or what is the name of the office in which the patient attends annual eye exams? [facility] If pt is not established with a provider, would they like to be referred to a provider to establish care? No .   Dental Screening: Recommended annual dental exams for proper oral hygiene  Community Resource Referral / Chronic Care Management: CRR required this visit?  No   CCM required this visit?  No      Plan:     I have personally reviewed and noted the following in the patient's chart:   . Medical and social history . Use of alcohol, tobacco or illicit drugs  . Current medications and supplements including opioid prescriptions. Patient is not currently taking opioid prescriptions. . Functional ability and status . Nutritional status . Physical activity . Advanced directives . List of other physicians . Hospitalizations, surgeries, and ER visits in previous 12 months . Vitals . Screenings to  include cognitive, depression, and falls . Referrals and appointments  In addition, I have reviewed and discussed with patient certain preventive protocols, quality metrics, and best practice recommendations. A written personalized care plan for preventive services as well as general preventive health recommendations were provided to patient.     Sandrea Hammond, LPN   2/0/7218   Nurse Notes: None

## 2020-11-18 NOTE — Patient Instructions (Signed)
Jacob Fowler , Thank you for taking time to come for your Medicare Wellness Visit. I appreciate your ongoing commitment to your health goals. Please review the following plan we discussed and let me know if I can assist you in the future.   Screening recommendations/referrals: Colonoscopy: Done 12/31/2017 - Repeat 10 years Recommended yearly ophthalmology/optometry visit for glaucoma screening and checkup Recommended yearly dental visit for hygiene and checkup  Vaccinations: Influenza vaccine: Done 05/10/2020 - Repeat every year Pneumococcal vaccine: Done 05/27/2013 & 10/04/2017 Tdap vaccine: Done 02/16/2014 - repeat every 10 years Shingles vaccine: not recommended for MS patients   Covid-19: Declined  Advanced directives: Advance directive discussed with you today. Even though you declined this today, please call our office should you change your mind, and we can give you the proper paperwork for you to fill out.  Conditions/risks identified: Aim for 30 minutes of exercise each day, drink 6-8 glasses of water and eat lots of fruits and vegetables.  Next appointment: Follow up in one year for your annual wellness visit.   Preventive Care 47 Years and Older, Male  Preventive care refers to lifestyle choices and visits with your health care provider that can promote health and wellness. What does preventive care include?  A yearly physical exam. This is also called an annual well check.  Dental exams once or twice a year.  Routine eye exams. Ask your health care provider how often you should have your eyes checked.  Personal lifestyle choices, including:  Daily care of your teeth and gums.  Regular physical activity.  Eating a healthy diet.  Avoiding tobacco and drug use.  Limiting alcohol use.  Practicing safe sex.  Taking low doses of aspirin every day.  Taking vitamin and mineral supplements as recommended by your health care provider. What happens during an annual well  check? The services and screenings done by your health care provider during your annual well check will depend on your age, overall health, lifestyle risk factors, and family history of disease. Counseling  Your health care provider may ask you questions about your:  Alcohol use.  Tobacco use.  Drug use.  Emotional well-being.  Home and relationship well-being.  Sexual activity.  Eating habits.  History of falls.  Memory and ability to understand (cognition).  Work and work Statistician. Screening  You may have the following tests or measurements:  Height, weight, and BMI.  Blood pressure.  Lipid and cholesterol levels. These may be checked every 5 years, or more frequently if you are over 87 years old.  Skin check.  Lung cancer screening. You may have this screening every year starting at age 55 if you have a 30-pack-year history of smoking and currently smoke or have quit within the past 15 years.  Fecal occult blood test (FOBT) of the stool. You may have this test every year starting at age 68.  Flexible sigmoidoscopy or colonoscopy. You may have a sigmoidoscopy every 5 years or a colonoscopy every 10 years starting at age 3.  Prostate cancer screening. Recommendations will vary depending on your family history and other risks.  Hepatitis C blood test.  Hepatitis B blood test.  Sexually transmitted disease (STD) testing.  Diabetes screening. This is done by checking your blood sugar (glucose) after you have not eaten for a while (fasting). You may have this done every 1-3 years.  Abdominal aortic aneurysm (AAA) screening. You may need this if you are a current or former smoker.  Osteoporosis. You may  be screened starting at age 26 if you are at high risk. Talk with your health care provider about your test results, treatment options, and if necessary, the need for more tests. Vaccines  Your health care provider may recommend certain vaccines, such  as:  Influenza vaccine. This is recommended every year.  Tetanus, diphtheria, and acellular pertussis (Tdap, Td) vaccine. You may need a Td booster every 10 years.  Zoster vaccine. You may need this after age 10.  Pneumococcal 13-valent conjugate (PCV13) vaccine. One dose is recommended after age 13.  Pneumococcal polysaccharide (PPSV23) vaccine. One dose is recommended after age 52. Talk to your health care provider about which screenings and vaccines you need and how often you need them. This information is not intended to replace advice given to you by your health care provider. Make sure you discuss any questions you have with your health care provider. Document Released: 07/30/2015 Document Revised: 03/22/2016 Document Reviewed: 05/04/2015 Elsevier Interactive Patient Education  2017 [place] Prevention in the Home Falls can cause injuries. They can happen to people of all ages. There are many things you can do to make your home safe and to help prevent falls. What can I do on the outside of my home?  Regularly fix the edges of walkways and driveways and fix any cracks.  Remove anything that might make you trip as you walk through a door, such as a raised step or threshold.  Trim any bushes or trees on the path to your home.  Use bright outdoor lighting.  Clear any walking paths of anything that might make someone trip, such as rocks or tools.  Regularly check to see if handrails are loose or broken. Make sure that both sides of any steps have handrails.  Any raised decks and porches should have guardrails on the edges.  Have any leaves, snow, or ice cleared regularly.  Use sand or salt on walking paths during winter.  Clean up any spills in your garage right away. This includes oil or grease spills. What can I do in the bathroom?  Use night lights.  Install grab bars by the toilet and in the tub and shower. Do not use towel bars as grab bars.  Use  non-skid mats or decals in the tub or shower.  If you need to sit down in the shower, use a plastic, non-slip stool.  Keep the floor dry. Clean up any water that spills on the floor as soon as it happens.  Remove soap buildup in the tub or shower regularly.  Attach bath mats securely with double-sided non-slip rug tape.  Do not have throw rugs and other things on the floor that can make you trip. What can I do in the bedroom?  Use night lights.  Make sure that you have a light by your bed that is easy to reach.  Do not use any sheets or blankets that are too big for your bed. They should not hang down onto the floor.  Have a firm chair that has side arms. You can use this for support while you get dressed.  Do not have throw rugs and other things on the floor that can make you trip. What can I do in the kitchen?  Clean up any spills right away.  Avoid walking on wet floors.  Keep items that you use a lot in easy-to-reach places.  If you need to reach something above you, use a strong step stool that has  a grab bar.  Keep electrical cords out of the way.  Do not use floor polish or wax that makes floors slippery. If you must use wax, use non-skid floor wax.  Do not have throw rugs and other things on the floor that can make you trip. What can I do with my stairs?  Do not leave any items on the stairs.  Make sure that there are handrails on both sides of the stairs and use them. Fix handrails that are broken or loose. Make sure that handrails are as long as the stairways.  Check any carpeting to make sure that it is firmly attached to the stairs. Fix any carpet that is loose or worn.  Avoid having throw rugs at the top or bottom of the stairs. If you do have throw rugs, attach them to the floor with carpet tape.  Make sure that you have a light switch at the top of the stairs and the bottom of the stairs. If you do not have them, ask someone to add them for you. What  else can I do to help prevent falls?  Wear shoes that:  Do not have high heels.  Have rubber bottoms.  Are comfortable and fit you well.  Are closed at the toe. Do not wear sandals.  If you use a stepladder:  Make sure that it is fully opened. Do not climb a closed stepladder.  Make sure that both sides of the stepladder are locked into place.  Ask someone to hold it for you, if possible.  Clearly mark and make sure that you can see:  Any grab bars or handrails.  First and last steps.  Where the edge of each step is.  Use tools that help you move around (mobility aids) if they are needed. These include:  Canes.  Walkers.  Scooters.  Crutches.  Turn on the lights when you go into a dark area. Replace any light bulbs as soon as they burn out.  Set up your furniture so you have a clear path. Avoid moving your furniture around.  If any of your floors are uneven, fix them.  If there are any pets around you, be aware of where they are.  Review your medicines with your doctor. Some medicines can make you feel dizzy. This can increase your chance of falling. Ask your doctor what other things that you can do to help prevent falls. This information is not intended to replace advice given to you by your health care provider. Make sure you discuss any questions you have with your health care provider. Document Released: 04/29/2009 Document Revised: 12/09/2015 Document Reviewed: 08/07/2014 Elsevier Interactive Patient Education  2017 [employer].

## 2020-11-22 NOTE — Patient Instructions (Signed)
Jacob Fowler  11/22/2020     @PREFPERIOPPHARMACY @   Your procedure is scheduled on  11/25/2020   Report to [facility] at  0900  A.M.   Call this number if you have problems the morning of surgery:  [phone]   Remember:  Do not eat or drink after midnight.                        Take these medicines the morning of surgery with A SIP OF WATER  Uroxatral, amlodipine, isosorbide, levothyroxine, prilosec.   DO NOT take any medications for diabetes the morning of your procedure.  If your glucose is 70 or below the morning of your procedure,  drink 1/2 cup of clear liquid containing sugar and recheck your glucose in 15 minutes. If your glucose is still 70 or below, call [phone] for instructions  If your glucose is 300 or above the morning of your procedure, call [phone] for instructions.    Do not wear jewelry, make-up or nail polish.  Do not wear lotions, powders, or perfumes, or deodorant.  Do not shave 48 hours prior to surgery.  Men may shave face and neck.  Do not bring valuables to the hospital.  [facility] is not responsible for any belongings or valuables.  Contacts, dentures or bridgework may not be worn into surgery.  Leave your suitcase in the car.  After surgery it may be brought to your room.  For patients admitted to the hospital, discharge time will be determined by your treatment team.  Patients discharged the day of surgery will not be allowed to drive home and must have someone with them for 24 hours.   Place clean sheets on your bed the night before your procedure and DO NOT sleep with pets this night.  Shower with CHG the night before and the morning of your procedure and DO NOT use CHG on your face, hair or genitals.  After each shower, dry off with a clean towel, put on clean, comfortable clothes and brush your teeth.   Special instructions:  DO NOT smoke or vape for 24 hours befoe your procedure.  Please read over the  following fact sheets that you were given. Coughing and Deep Breathing, Surgical Site Infection Prevention, Anesthesia Post-op Instructions and Care and Recovery After Surgery       Ureteral Stent Implantation, Care After This sheet gives you information about how to care for yourself after your procedure. Your health care provider may also give you more specific instructions. If you have problems or questions, contact your health care provider. What can I expect after the procedure? After the procedure, it is common to have:  Nausea.  Mild pain when you urinate. You may feel this pain in your lower back or lower abdomen. The pain should stop within a few minutes after you urinate. This may last for up to 1 week.  A small amount of blood in your urine for several days. Follow these instructions at home: Medicines  Take over-the-counter and prescription medicines only as told by your health care provider.  If you were prescribed an antibiotic medicine, take it as told by your health care provider. Do not stop taking the antibiotic even if you start to feel better.  Do not drive for 24 hours if you were given a sedative during your procedure.  Ask your health care provider if the medicine prescribed to you requires  you to avoid driving or using heavy machinery. Activity  Rest as told by your health care provider.  Avoid sitting for a long time without moving. Get up to take short walks every 1-2 hours. This is important to improve blood flow and breathing. Ask for help if you feel weak or unsteady.  Return to your normal activities as told by your health care provider. Ask your health care provider what activities are safe for you. General instructions  Watch for any blood in your urine. Call your health care provider if the amount of blood in your urine increases.  If you have a catheter: ? Follow instructions from your health care provider about taking care of your catheter and  collection bag. ? Do not take baths, swim, or use a hot tub until your health care provider approves. Ask your health care provider if you may take showers. You may only be allowed to take sponge baths.  Drink enough fluid to keep your urine pale yellow.  Do not use any products that contain nicotine or tobacco, such as cigarettes, e-cigarettes, and chewing tobacco. These can delay healing after surgery. If you need help quitting, ask your health care provider.  Keep all follow-up visits as told by your health care provider. This is important.   Contact a health care provider if:  You have pain that gets worse or does not get better with medicine, especially pain when you urinate.  You have difficulty urinating.  You feel nauseous or you vomit repeatedly during a period of more than 2 days after the procedure. Get help right away if:  Your urine is dark red or has blood clots in it.  You are leaking urine (have incontinence).  The end of the stent comes out of your urethra.  You cannot urinate.  You have sudden, sharp, or severe pain in your abdomen or lower back.  You have a fever.  You have swelling or pain in your legs.  You have difficulty breathing. Summary  After the procedure, it is common to have mild pain when you urinate that goes away within a few minutes after you urinate. This may last for up to 1 week.  Watch for any blood in your urine. Call your health care provider if the amount of blood in your urine increases.  Take over-the-counter and prescription medicines only as told by your health care provider.  Drink enough fluid to keep your urine pale yellow. This information is not intended to replace advice given to you by your health care provider. Make sure you discuss any questions you have with your health care provider. Document Revised: 04/09/2018 Document Reviewed: 04/10/2018 Elsevier Patient Education  2021 [place] Anesthesia, Adult,  Care After This sheet gives you information about how to care for yourself after your procedure. Your health care provider may also give you more specific instructions. If you have problems or questions, contact your health care provider. What can I expect after the procedure? After the procedure, the following side effects are common:  Pain or discomfort at the IV site.  Nausea.  Vomiting.  Sore throat.  Trouble concentrating.  Feeling cold or chills.  Feeling weak or tired.  Sleepiness and fatigue.  Soreness and body aches. These side effects can affect parts of the body that were not involved in surgery. Follow these instructions at home: For the time period you were told by your health care provider:  Rest.  Do not participate in activities  where you could fall or become injured.  Do not drive or use machinery.  Do not drink alcohol.  Do not take sleeping pills or medicines that cause drowsiness.  Do not make important decisions or sign legal documents.  Do not take care of children on your own.   Eating and drinking  Follow any instructions from your health care provider about eating or drinking restrictions.  When you feel hungry, start by eating small amounts of foods that are soft and easy to digest (bland), such as toast. Gradually return to your regular diet.  Drink enough fluid to keep your urine pale yellow.  If you vomit, rehydrate by drinking water, juice, or clear broth. General instructions  If you have sleep apnea, surgery and certain medicines can increase your risk for breathing problems. Follow instructions from your health care provider about wearing your sleep device: ? Anytime you are sleeping, including during daytime naps. ? While taking prescription pain medicines, sleeping medicines, or medicines that make you drowsy.  Have a responsible adult stay with you for the time you are told. It is important to have someone help care for you  until you are awake and alert.  Return to your normal activities as told by your health care provider. Ask your health care provider what activities are safe for you.  Take over-the-counter and prescription medicines only as told by your health care provider.  If you smoke, do not smoke without supervision.  Keep all follow-up visits as told by your health care provider. This is important. Contact a health care provider if:  You have nausea or vomiting that does not get better with medicine.  You cannot eat or drink without vomiting.  You have pain that does not get better with medicine.  You are unable to pass urine.  You develop a skin rash.  You have a fever.  You have redness around your IV site that gets worse. Get help right away if:  You have difficulty breathing.  You have chest pain.  You have blood in your urine or stool, or you vomit blood. Summary  After the procedure, it is common to have a sore throat or nausea. It is also common to feel tired.  Have a responsible adult stay with you for the time you are told. It is important to have someone help care for you until you are awake and alert.  When you feel hungry, start by eating small amounts of foods that are soft and easy to digest (bland), such as toast. Gradually return to your regular diet.  Drink enough fluid to keep your urine pale yellow.  Return to your normal activities as told by your health care provider. Ask your health care provider what activities are safe for you. This information is not intended to replace advice given to you by your health care provider. Make sure you discuss any questions you have with your health care provider. Document Revised: 03/18/2020 Document Reviewed: 10/16/2019 Elsevier Patient Education  2021 [employer].

## 2020-11-23 ENCOUNTER — Encounter (HOSPITAL_COMMUNITY)
Admission: RE | Admit: 2020-11-23 | Discharge: 2020-11-23 | Disposition: A | Discharge status: Home / Self Care | Payer: PPO | Source: Ambulatory Visit | Attending: Urology | Admitting: Urology

## 2020-11-23 ENCOUNTER — Encounter (HOSPITAL_COMMUNITY): Payer: Self-pay

## 2020-11-23 ENCOUNTER — Other Ambulatory Visit (HOSPITAL_COMMUNITY)
Admission: RE | Admit: 2020-11-23 | Discharge: 2020-11-23 | Disposition: A | Discharge status: Home / Self Care | Payer: PPO | Source: Ambulatory Visit | Attending: Urology | Admitting: Urology

## 2020-11-23 ENCOUNTER — Other Ambulatory Visit: Payer: Self-pay

## 2020-11-23 DIAGNOSIS — Z01818 Encounter for other preprocedural examination: Secondary | ICD-10-CM | POA: Insufficient documentation

## 2020-11-23 DIAGNOSIS — Z20822 Contact with and (suspected) exposure to covid-19: Secondary | ICD-10-CM | POA: Diagnosis not present

## 2020-11-23 LAB — BASIC METABOLIC PANEL
Anion gap: 10 (ref 5–15)
BUN: 24 mg/dL — ABNORMAL HIGH (ref 8–23)
CO2: 24 mmol/L (ref 22–32)
Calcium: 9.6 mg/dL (ref 8.9–10.3)
Chloride: 108 mmol/L (ref 98–111)
Creatinine, Ser: 1.12 mg/dL (ref 0.61–1.24)
GFR, Estimated: >60 mL/min (ref >60)
Glucose, Bld: 144 mg/dL — ABNORMAL HIGH (ref 70–99)
Potassium: 4.1 mmol/L (ref 3.5–5.1)
Sodium: 142 mmol/L (ref 135–145)

## 2020-11-23 LAB — HEMOGLOBIN A1C
Hgb A1c MFr Bld: 6.9 % — ABNORMAL HIGH (ref 4.8–5.6)
Mean Plasma Glucose: 151.33 mg/dL

## 2020-11-23 LAB — SARS CORONAVIRUS 2 (TAT 6-24 HRS): SARS Coronavirus 2: NEGATIVE

## 2020-11-24 ENCOUNTER — Ambulatory Visit: Payer: Self-pay | Admitting: Neurology

## 2020-11-24 NOTE — Pre-Procedure Instructions (Signed)
Hgba1c routed to PCP. 

## 2020-11-24 NOTE — Progress Notes (Signed)
I have reviewed and agree with the above AWV documentation.   Hendricks Limes, MSN, APRN, FNP-C [facility] 11/24/20

## 2020-11-25 ENCOUNTER — Ambulatory Visit (HOSPITAL_COMMUNITY): Payer: PPO | Admitting: Anesthesiology

## 2020-11-25 ENCOUNTER — Encounter (HOSPITAL_COMMUNITY): Payer: Self-pay | Admitting: Urology

## 2020-11-25 ENCOUNTER — Ambulatory Visit (HOSPITAL_COMMUNITY): Payer: PPO

## 2020-11-25 ENCOUNTER — Encounter (HOSPITAL_COMMUNITY)
Admission: RE | Disposition: A | Discharge status: Home / Self Care | Payer: Self-pay | Source: Home / Self Care | Attending: Urology

## 2020-11-25 ENCOUNTER — Ambulatory Visit (HOSPITAL_COMMUNITY)
Admission: RE | Admit: 2020-11-25 | Discharge: 2020-11-25 | Disposition: A | Discharge status: Home / Self Care | Payer: PPO | Attending: Urology | Admitting: Urology

## 2020-11-25 DIAGNOSIS — I10 Essential (primary) hypertension: Secondary | ICD-10-CM | POA: Diagnosis not present

## 2020-11-25 DIAGNOSIS — K219 Gastro-esophageal reflux disease without esophagitis: Secondary | ICD-10-CM | POA: Insufficient documentation

## 2020-11-25 DIAGNOSIS — Z8052 Family history of malignant neoplasm of bladder: Secondary | ICD-10-CM | POA: Diagnosis not present

## 2020-11-25 DIAGNOSIS — I2510 Atherosclerotic heart disease of native coronary artery without angina pectoris: Secondary | ICD-10-CM | POA: Diagnosis not present

## 2020-11-25 DIAGNOSIS — Z833 Family history of diabetes mellitus: Secondary | ICD-10-CM | POA: Diagnosis not present

## 2020-11-25 DIAGNOSIS — Z8051 Family history of malignant neoplasm of kidney: Secondary | ICD-10-CM | POA: Diagnosis not present

## 2020-11-25 DIAGNOSIS — Z823 Family history of stroke: Secondary | ICD-10-CM | POA: Diagnosis not present

## 2020-11-25 DIAGNOSIS — Z87891 Personal history of nicotine dependence: Secondary | ICD-10-CM | POA: Diagnosis not present

## 2020-11-25 DIAGNOSIS — Z8249 Family history of ischemic heart disease and other diseases of the circulatory system: Secondary | ICD-10-CM | POA: Diagnosis not present

## 2020-11-25 DIAGNOSIS — G35 Multiple sclerosis: Secondary | ICD-10-CM | POA: Insufficient documentation

## 2020-11-25 DIAGNOSIS — E119 Type 2 diabetes mellitus without complications: Secondary | ICD-10-CM | POA: Diagnosis not present

## 2020-11-25 DIAGNOSIS — N200 Calculus of kidney: Secondary | ICD-10-CM | POA: Diagnosis not present

## 2020-11-25 DIAGNOSIS — Z79899 Other long term (current) drug therapy: Secondary | ICD-10-CM | POA: Diagnosis not present

## 2020-11-25 HISTORY — PX: CYSTOSCOPY WITH RETROGRADE PYELOGRAM, URETEROSCOPY AND STENT PLACEMENT: SHX5789

## 2020-11-25 HISTORY — PX: HOLMIUM LASER APPLICATION: SHX5852

## 2020-11-25 LAB — GLUCOSE, CAPILLARY: Glucose-Capillary: 114 mg/dL — ABNORMAL HIGH (ref 70–99)

## 2020-11-25 SURGERY — CYSTOURETEROSCOPY, WITH RETROGRADE PYELOGRAM AND STENT INSERTION
Anesthesia: General | Laterality: Left

## 2020-11-25 MED ORDER — PROPOFOL 10 MG/ML IV BOLUS
INTRAVENOUS | Status: DC | PRN
  Administered 2020-11-25: 20 mg via INTRAVENOUS
  Administered 2020-11-25: 150 mg via INTRAVENOUS

## 2020-11-25 MED ORDER — FENTANYL CITRATE (PF) 100 MCG/2ML IJ SOLN
25.0000 ug | INTRAMUSCULAR | Status: DC | PRN
  Administered 2020-11-25 (×2): 25 ug via INTRAVENOUS
  Administered 2020-11-25 (×2): 50 ug via INTRAVENOUS
  Filled 2020-11-25 (×2): qty 2

## 2020-11-25 MED ORDER — ORAL CARE MOUTH RINSE: 15.0000 mL | Freq: Once | OROMUCOSAL | Status: AC

## 2020-11-25 MED ORDER — CEFAZOLIN SODIUM-DEXTROSE 2-4 GM/100ML-% IV SOLN
2.0000 g | INTRAVENOUS | Status: AC
  Administered 2020-11-25: 2 g via INTRAVENOUS

## 2020-11-25 MED ORDER — POTASSIUM CITRATE ER 15 MEQ (1620 MG) PO TBCR: 1.0000 | EXTENDED_RELEASE_TABLET | Freq: Two times a day (BID) | ORAL | 11 refills | Status: DC

## 2020-11-25 MED ORDER — EPHEDRINE SULFATE 50 MG/ML IJ SOLN
INTRAMUSCULAR | Status: DC | PRN
  Administered 2020-11-25: 10 mg via INTRAVENOUS

## 2020-11-25 MED ORDER — CEFAZOLIN SODIUM-DEXTROSE 2-4 GM/100ML-% IV SOLN
INTRAVENOUS | Status: AC
  Filled 2020-11-25: qty 100

## 2020-11-25 MED ORDER — WATER FOR IRRIGATION, STERILE IR SOLN
Status: DC | PRN
  Administered 2020-11-25: 250 mL

## 2020-11-25 MED ORDER — HYDROMORPHONE HCL 1 MG/ML IJ SOLN
INTRAMUSCULAR | Status: AC
  Filled 2020-11-25: qty 0.5

## 2020-11-25 MED ORDER — DIATRIZOATE MEGLUMINE 30 % UR SOLN
URETHRAL | Status: DC | PRN
  Administered 2020-11-25: 8 mL

## 2020-11-25 MED ORDER — BELLADONNA ALKALOIDS-OPIUM 16.2-60 MG RE SUPP
1.0000 | Freq: Once | RECTAL | Status: AC
Start: 2020-11-25 — End: 2020-11-25
  Administered 2020-11-25: 1 via RECTAL
  Filled 2020-11-25: qty 1

## 2020-11-25 MED ORDER — DEXAMETHASONE SODIUM PHOSPHATE 10 MG/ML IJ SOLN
INTRAMUSCULAR | Status: AC
  Filled 2020-11-25: qty 1

## 2020-11-25 MED ORDER — DIATRIZOATE MEGLUMINE 30 % UR SOLN
URETHRAL | Status: AC
  Filled 2020-11-25: qty 100

## 2020-11-25 MED ORDER — HYDROMORPHONE HCL 1 MG/ML IJ SOLN
0.2500 mg | INTRAMUSCULAR | Status: AC
  Administered 2020-11-25 (×2): 0.25 mg via INTRAVENOUS

## 2020-11-25 MED ORDER — EPHEDRINE 5 MG/ML INJ
INTRAVENOUS | Status: AC
  Filled 2020-11-25: qty 20

## 2020-11-25 MED ORDER — SODIUM CHLORIDE 0.9 % IR SOLN
Status: DC | PRN
  Administered 2020-11-25: 3000 mL via INTRAVESICAL
  Administered 2020-11-25: 3000 mL

## 2020-11-25 MED ORDER — FENTANYL CITRATE (PF) 100 MCG/2ML IJ SOLN
INTRAMUSCULAR | Status: DC | PRN
  Administered 2020-11-25: 25 ug via INTRAVENOUS
  Administered 2020-11-25: 50 ug via INTRAVENOUS
  Administered 2020-11-25: 25 ug via INTRAVENOUS

## 2020-11-25 MED ORDER — ONDANSETRON HCL 4 MG/2ML IJ SOLN
INTRAMUSCULAR | Status: DC | PRN
  Administered 2020-11-25: 4 mg via INTRAVENOUS

## 2020-11-25 MED ORDER — CHLORHEXIDINE GLUCONATE 0.12 % MT SOLN
15.0000 mL | Freq: Once | OROMUCOSAL | Status: AC
  Administered 2020-11-25: 15 mL via OROMUCOSAL

## 2020-11-25 MED ORDER — LACTATED RINGERS IV SOLN: INTRAVENOUS | Status: DC

## 2020-11-25 MED ORDER — ONDANSETRON HCL 4 MG/2ML IJ SOLN
4.0000 mg | Freq: Once | INTRAMUSCULAR | Status: AC | PRN
  Administered 2020-11-25: 4 mg via INTRAVENOUS
  Filled 2020-11-25: qty 2

## 2020-11-25 MED ORDER — BELLADONNA ALKALOIDS-OPIUM 16.2-30 MG RE SUPP: 1.0000 | Freq: Once | RECTAL | Status: DC

## 2020-11-25 MED ORDER — OXYCODONE-ACETAMINOPHEN 5-325 MG PO TABS: 1.0000 | ORAL_TABLET | ORAL | 0 refills | Status: DC | PRN

## 2020-11-25 MED ORDER — FENTANYL CITRATE (PF) 100 MCG/2ML IJ SOLN
INTRAMUSCULAR | Status: AC
  Filled 2020-11-25: qty 2

## 2020-11-25 SURGICAL SUPPLY — 26 items
BAG DRAIN URO TABLE W/ADPT NS (BAG) ×2 IMPLANT
BAG DRN 8 ADPR NS SKTRN CSTL (BAG) ×1
BAG HAMPER (MISCELLANEOUS) ×2 IMPLANT
CATH INTERMIT  6FR 70CM (CATHETERS) ×2 IMPLANT
CLOTH BEACON ORANGE TIMEOUT ST (SAFETY) ×2 IMPLANT
DECANTER SPIKE VIAL GLASS SM (MISCELLANEOUS) ×2 IMPLANT
EXTRACTOR STONE NITINOL NGAGE (UROLOGICAL SUPPLIES) ×1 IMPLANT
GLOVE BIO SURGEON STRL SZ8 (GLOVE) ×2 IMPLANT
GLOVE SURG UNDER POLY LF SZ7 (GLOVE) ×4 IMPLANT
GOWN STRL REUS W/TWL LRG LVL3 (GOWN DISPOSABLE) ×2 IMPLANT
GOWN STRL REUS W/TWL XL LVL3 (GOWN DISPOSABLE) ×2 IMPLANT
GUIDEWIRE STR DUAL SENSOR (WIRE) ×2 IMPLANT
GUIDEWIRE STR ZIPWIRE 035X150 (MISCELLANEOUS) ×2 IMPLANT
IV NS IRRIG 3000ML ARTHROMATIC (IV SOLUTION) ×4 IMPLANT
KIT TURNOVER CYSTO (KITS) ×2 IMPLANT
MANIFOLD NEPTUNE II (INSTRUMENTS) ×2 IMPLANT
PACK CYSTO (CUSTOM PROCEDURE TRAY) ×2 IMPLANT
PAD ARMBOARD 7.5X6 YLW CONV (MISCELLANEOUS) ×2 IMPLANT
SHEATH URETERAL 12FRX35CM (MISCELLANEOUS) ×1 IMPLANT
STENT URET 6FRX26 CONTOUR (STENTS) ×1 IMPLANT
SYR 10ML LL (SYRINGE) ×2 IMPLANT
SYR CONTROL 10ML LL (SYRINGE) ×2 IMPLANT
TOWEL OR 17X26 4PK STRL BLUE (TOWEL DISPOSABLE) ×2 IMPLANT
TRACTIP FLEXIVA PULS ID 200XHI (Laser) IMPLANT
TRACTIP FLEXIVA PULSE ID 200 (Laser) ×2
WATER STERILE IRR 500ML POUR (IV SOLUTION) ×2 IMPLANT

## 2020-11-25 NOTE — Anesthesia Postprocedure Evaluation (Signed)
Anesthesia Post Note  Patient: Jacob Fowler  Procedure(s) Performed: CYSTOSCOPY WITH LEFT RETROGRADE PYELOGRAM, LEFT URETEROSCOPY WITH LASER AND LEFT URETERAL STENT PLACEMENT (Left ) HOLMIUM LASER APPLICATION (Left )  Patient location during evaluation: PACU Anesthesia Type: General Level of consciousness: awake and alert and oriented Pain management: pain level controlled Vital Signs Assessment: post-procedure vital signs reviewed and stable Respiratory status: spontaneous breathing and respiratory function stable Cardiovascular status: blood pressure returned to baseline and stable Postop Assessment: no apparent nausea or vomiting Anesthetic complications: no   No complications documented.   Last Vitals:  Vitals:   11/25/20 1550 11/25/20 1600  BP:  125/67  Pulse: 78 82  Resp: 14 (!) 23  Temp:  36.6 C  SpO2: 92% 94%    Last Pain:  Vitals:   11/25/20 1600  TempSrc: Oral  PainSc: 2                  Yanelie Abraha C Kristi Norment

## 2020-11-25 NOTE — Progress Notes (Signed)
Pt's pain level was 2/10 after B&O suppository was given. Oxygen saturations was 94% on room air. Encouraged pt to continue to use incentive spirometer at home.

## 2020-11-25 NOTE — Anesthesia Preprocedure Evaluation (Signed)
Anesthesia Evaluation  Patient identified by MRN, date of birth, ID band Patient awake    Reviewed: Allergy & Precautions, NPO status , Patient's Chart, lab work & pertinent test results  History of Anesthesia Complications Negative for: history of anesthetic complications  Airway Mallampati: III  TM Distance: >3 FB Neck ROM: Full    Dental  (+) Dental Advisory Given, Missing   Pulmonary neg pulmonary ROS, former smoker,    Pulmonary exam normal breath sounds clear to auscultation       Cardiovascular Exercise Tolerance: Poor hypertension, Pt. on medications + CAD  Normal cardiovascular exam+ dysrhythmias + Valvular Problems/Murmurs  Rhythm:Regular Rate:Normal     Neuro/Psych  Neuromuscular disease (multiple sclerosis) negative psych ROS   GI/Hepatic Neg liver ROS, hiatal hernia, GERD  Medicated and Controlled,  Endo/Other  diabetes, Well Controlled, Type 2, Oral Hypoglycemic AgentsHypothyroidism   Renal/GU Renal disease (stones)     Musculoskeletal negative musculoskeletal ROS (+)   Abdominal   Peds  Hematology negative hematology ROS (+)   Anesthesia Other Findings   Reproductive/Obstetrics negative OB ROS                             Anesthesia Physical Anesthesia Plan  ASA: III  Anesthesia Plan: General   Post-op Pain Management:    Induction: Intravenous  PONV Risk Score and Plan: Ondansetron and Dexamethasone  Airway Management Planned: Oral ETT and LMA  Additional Equipment:   Intra-op Plan:   Post-operative Plan:   Informed Consent: I have reviewed the patients History and Physical, chart, labs and discussed the procedure including the risks, benefits and alternatives for the proposed anesthesia with the patient or authorized representative who has indicated his/her understanding and acceptance.     Dental advisory given  Plan Discussed with: CRNA and  Surgeon  Anesthesia Plan Comments:         Anesthesia Quick Evaluation

## 2020-11-25 NOTE — Op Note (Signed)
.  Preoperative diagnosis: Left renal calculi  Postoperative diagnosis: Same  Procedure: 1 cystoscopy 2. Left retrograde pyelography 3.  Intraoperative fluoroscopy, under one hour, with interpretation 4.  Left ureteroscopic stone manipulation with laser lithotripsy 5.  Left 6 x 26 JJ stent placement  Attending: Nicolette Bang  Anesthesia: General  Estimated blood loss: None  Drains: Left 6 x 26 JJ ureteral stent without tether  Specimens: stone for analysis  Antibiotics: ancef  Findings: 1.5cm renal pelvis stone. Multiple lower pole calculi. No hydronephrosis. No masses/lesions in the bladder. Ureteral orifices in normal anatomic location.  Indications: Patient is a 70 year old male with a history of left renal stone and who has persistent left flank pain.  After discussing treatment options, he decided proceed with left ureteroscopic stone manipulation.  Procedure in detail: The patient was brought to the operating room and a brief timeout was done to ensure correct patient, correct procedure, correct site.  General anesthesia was administered patient was placed in dorsal lithotomy position.  Her genitalia was then prepped and draped in usual sterile fashion.  A rigid 67 French cystoscope was passed in the urethra and the bladder.  Bladder was inspected free masses or lesions.  the ureteral orifices were in the normal orthotopic locations.  a 6 french ureteral catheter was then instilled into the left ureteral orifice.  a gentle retrograde was obtained and findings noted above.  we then placed a zip wire through the ureteral catheter and advanced up to the renal pelvis.  we then removed the cystoscope and cannulated the left ureteral orifice with a semirigid ureteroscope.  No stone was found in the ureter. Once we reached the UPJ a sensor wire was advanced in to the renal pelvis. We then removed the ureteroscope and advanced am 12/14 x 35cm access sheath up to the renal pelvis. We then  used the flexible ureteroscope to perform nephroscopy. We encountered the stone in the renal pelvis and lower pole.  Using a 242nm laser fiber the large renal pelvis stone was fragmented.  the pieces were then removed with a Ngage basket.    once all stone fragments were removed we then removed the access sheath under direct vision and noted no injury to the ureter. We then placed a 6 x 26 double-j ureteral stent over the original zip wire.  We then removed the wire and good coil was noted in the the renal pelvis under fluoroscopy and the bladder under direct vision. the bladder was then drained and this concluded the procedure which was well tolerated by patient.  Complications: None  Condition: Stable, extubated, transferred to PACU  Plan: Patient is to be discharged home as to follow-up in 2 week for stent removal.

## 2020-11-25 NOTE — Transfer of Care (Signed)
Immediate Anesthesia Transfer of Care Note  Patient: DECLYN DELSOL  Procedure(s) Performed: CYSTOSCOPY WITH LEFT RETROGRADE PYELOGRAM, LEFT URETEROSCOPY WITH LASER AND LEFT URETERAL STENT PLACEMENT (Left ) HOLMIUM LASER APPLICATION (Left )  Patient Location: PACU  Anesthesia Type:General  Level of Consciousness: awake  Airway & Oxygen Therapy: Patient Spontanous Breathing and Patient connected to nasal cannula oxygen  Post-op Assessment: Report given to RN and Post -op Vital signs reviewed and stable  Post vital signs: Reviewed and stable  Last Vitals:  Vitals Value Taken Time  BP 152/74   Temp    Pulse 63 11/25/20 1217  Resp 15 11/25/20 1217  SpO2 95 % 11/25/20 1217  Vitals shown include unvalidated device data.  Last Pain:  Vitals:   11/25/20 0917  TempSrc: Oral  PainSc: 0-No pain      Patients Stated Pain Goal: 6 (20/94/70 9628)  Complications: No complications documented.

## 2020-11-25 NOTE — H&P (Signed)
Urology Admission H&P  Chief Complaint: left renal calculi  History of Present Illness: Jacob Fowler is a 70yo with a hx of nephrolithiasis who has bilateral renal calculi with a larger stone burden on the left. No flank pain currently. No LUTS  Past Medical History:  Diagnosis Date  . Acquired leg length discrepancy   . Bladder stone   . BPH associated with nocturia   . CAD (coronary artery disease) CARDIOLOGIST- DR HOCHREIN--  LAST VISIT 09-27-2011 IN EPIC   Non obstructive Cath 2010-  HX CORONARY SPASM 2004  . Cataract    bilateral   . GERD (gastroesophageal reflux disease)   . H/O hiatal hernia   . Heart murmur   . Heartburn   . High cholesterol   . History of nephrolithiasis 2009  . Hx of adenomatous colonic polyps   . Hyperlipidemia   . Hypertension   . Movement disorder   . Multiple sclerosis ([place]) DX  10/10---  NEUROLOGIST  DR Delfino Lovett [facility] ([place])   RIGHT SIDE AFFECTED MORE W/ WEAKNESS- - USES CANE  . Neuromuscular disorder ([facility])    MS  . NIDDM (non-insulin dependent diabetes mellitus)   . Peyronie's disease   . RBBB   . Renal stone RIGHT  . Rosacea   . Vision abnormalities    Past Surgical History:  Procedure Laterality Date  . CARDIAC CATHETERIZATION  02-26-2009 ---  DR Ida Rogue   NONOBSTRUCTIVE CAD/ 50% DISTAL RCA/ 30% LAD / NORMAL LVF  . CYSTOSCOPY W/ RETROGRADES  10/30/2011   Procedure: CYSTOSCOPY WITH RETROGRADE PYELOGRAM;  Surgeon: Claybon Jabs, MD;  Location: [facility];  Service: Urology;  Laterality: Right;  . CYSTOSCOPY WITH LITHOLAPAXY  10/30/2011   Procedure: CYSTOSCOPY WITH LITHOLAPAXY;  Surgeon: Claybon Jabs, MD;  Location: [facility];  Service: Urology;  Laterality: N/A;  . EXTRACORPOREAL SHOCK WAVE LITHOTRIPSY  09-05-2010   RIGHT  . EYE SURGERY Right 08/30/2018  . NESBIT PROCEDURE  10-17-2004   CORRECTION OF PENILE ANGULATION (PEYRONIES DISEASE)    Home Medications:  Current Facility-Administered  Medications  Medication Dose Route Frequency Provider Last Rate Last Admin  . ceFAZolin (ANCEF) 2-4 GM/100ML-% IVPB           . ceFAZolin (ANCEF) IVPB 2g/100 mL premix  2 g Intravenous 30 min Pre-Op Malvin Morrish, Candee Furbish, MD      . lactated ringers infusion   Intravenous Continuous Denese Killings, MD 50 mL/hr at 11/25/20 1017 Continued from Pre-op at 11/25/20 1017   Allergies: No Known Allergies  Family History  Problem Relation Age of Onset  . Stroke Mother   . Heart attack Mother   . Diabetes Mother   . Hypertension Mother   . Renal cancer Father   . Diabetes Sister   . Heart attack Brother   . Diabetes Son   . Heart attack Maternal Grandfather   . Heart attack Brother   . Bladder Cancer Brother    Social History:  reports that he quit smoking about 47 years ago. His smoking use included cigarettes. He has a 7.00 pack-year smoking history. His smokeless tobacco use includes chew. He reports that he does not drink alcohol and does not use drugs.  Review of Systems  All other systems reviewed and are negative.   Physical Exam:  Vital signs in last 24 hours: Temp:  [97.9 F (36.6 C)] 97.9 F (36.6 C) (05/12 0917) Pulse Rate:  [70] 70 (05/12 0917) Resp:  [16]  16 (05/12 0917) BP: (126)/(76) 126/76 (05/12 0917) SpO2:  [99 %] 99 % (05/12 0917) Physical Exam Vitals reviewed.  Constitutional:      Appearance: Normal appearance.  HENT:     Head: Normocephalic and atraumatic.     Nose: Nose normal.     Mouth/Throat:     Mouth: Mucous membranes are moist.  Eyes:     Extraocular Movements: Extraocular movements intact.     Pupils: Pupils are equal, round, and reactive to light.  Cardiovascular:     Rate and Rhythm: Normal rate and regular rhythm.  Pulmonary:     Effort: Pulmonary effort is normal. No respiratory distress.  Abdominal:     General: Abdomen is flat. There is no distension.  Musculoskeletal:        General: No swelling. Normal range of motion.      Cervical back: Normal range of motion and neck supple.  Skin:    General: Skin is warm and dry.  Neurological:     General: No focal deficit present.     Mental Status: He is alert and oriented to person, place, and time.  Psychiatric:        Mood and Affect: Mood normal.        Behavior: Behavior normal.        Thought Content: Thought content normal.        Judgment: Judgment normal.     Laboratory Data:  Results for orders placed or performed during the hospital encounter of 11/25/20 (from the past 24 hour(s))  Glucose, capillary     Status: Abnormal   Collection Time: 11/25/20  9:25 AM  Result Value Ref Range   Glucose-Capillary 114 (H) 70 - 99 mg/dL   Recent Results (from the past 240 hour(s))  SARS CORONAVIRUS 2 (TAT 6-24 HRS) Nasopharyngeal Nasopharyngeal Swab     Status: None   Collection Time: 11/23/20 10:25 AM   Specimen: Nasopharyngeal Swab  Result Value Ref Range Status   SARS Coronavirus 2 NEGATIVE NEGATIVE Final    Comment: (NOTE) SARS-CoV-2 target nucleic acids are NOT DETECTED.  The SARS-CoV-2 RNA is generally detectable in upper and lower respiratory specimens during the acute phase of infection. Negative results do not preclude SARS-CoV-2 infection, do not rule out co-infections with other pathogens, and should not be used as the sole basis for treatment or other patient management decisions. Negative results must be combined with clinical observations, patient history, and epidemiological information. The expected result is Negative.  Fact Sheet for Patients: SugarRoll.be  Fact Sheet for Healthcare Providers: https://www.woods-mathews.com/  This test is not yet approved or cleared by the [place] FDA and  has been authorized for detection and/or diagnosis of SARS-CoV-2 by FDA under an Emergency Use Authorization (EUA). This EUA will remain  in effect (meaning this test can be used) for the duration of  the COVID-19 declaration under Se ction 564(b)(1) of the Act, 21 U.S.C. section 360bbb-3(b)(1), unless the authorization is terminated or revoked sooner.  Performed at [facility], [place] [address] [place], [place] [address]    Creatinine: Recent Labs    11/23/20 1121  CREATININE 1.12   Baseline Creatinine: 1.1  Impression/Assessment:  69yo with left renal calculi  Plan:  -We discussed the management of kidney stones. These options include observation, ureteroscopy, shockwave lithotripsy (ESWL) and percutaneous nephrolithotomy (PCNL). We discussed which options are relevant to the patient's stone(s). We discussed the natural history of kidney stones as well as the complications of  untreated stones and the impact on quality of life without treatment as well as with each of the above listed treatments. We also discussed the efficacy of each treatment in its ability to clear the stone burden. With any of these management options I discussed the signs and symptoms of infection and the need for emergent treatment should these be experienced. For each option we discussed the ability of each procedure to clear the patient of their stone burden.   For observation I described the risks which include but are not limited to silent renal damage, life-threatening infection, need for emergent surgery, failure to pass stone and pain.   For ureteroscopy I described the risks which include bleeding, infection, damage to contiguous structures, positioning injury, ureteral stricture, ureteral avulsion, ureteral injury, need for prolonged ureteral stent, inability to perform ureteroscopy, need for an interval procedure, inability to clear stone burden, stent discomfort/pain, heart attack, stroke, pulmonary embolus and the inherent risks with general anesthesia.   For shockwave lithotripsy I described the risks which include arrhythmia, kidney contusion, kidney hemorrhage, need for transfusion, pain,  inability to adequately break up stone, inability to pass stone fragments, Steinstrasse, infection associated with obstructing stones, need for alternate surgical procedure, need for repeat shockwave lithotripsy, MI, CVA, PE and the inherent risks with anesthesia/conscious sedation.   For PCNL I described the risks including positioning injury, pneumothorax, hydrothorax, need for chest tube, inability to clear stone burden, renal laceration, arterial venous fistula or malformation, need for embolization of kidney, loss of kidney or renal function, need for repeat procedure, need for prolonged nephrostomy tube, ureteral avulsion, MI, CVA, PE and the inherent risks of general anesthesia.   - The patient would like to proceed with Left ureteroscopic stone extraction  Nicolette Bang 11/25/2020, 10:18 AM

## 2020-11-25 NOTE — Anesthesia Procedure Notes (Signed)
Procedure Name: LMA Insertion Date/Time: 11/25/2020 10:42 AM Performed by: Vista Deck, CRNA Pre-anesthesia Checklist: Patient identified, Patient being monitored, Emergency Drugs available, Timeout performed and Suction available Patient Re-evaluated:Patient Re-evaluated prior to induction Oxygen Delivery Method: Circle System Utilized Preoxygenation: Pre-oxygenation with 100% oxygen Induction Type: IV induction Ventilation: Mask ventilation without difficulty LMA: LMA inserted LMA Size: 4.0 Number of attempts: 1 Placement Confirmation: positive ETCO2 and breath sounds checked- equal and bilateral Tube secured with: Tape Dental Injury: Teeth and Oropharynx as per pre-operative assessment

## 2020-11-25 NOTE — Discharge Instructions (Signed)
Ureteroscopy Ureteroscopy is a procedure to check for and treat problems inside part of the urinary tract. In this procedure, a thin, flexible tube with a light at the end (ureteroscope) is used to look at the inside of the kidneys and the ureters. The ureters are the tubes that carry urine from the kidneys to the bladder. The ureteroscope is inserted into one or both of the ureters. You may need this procedure if you have frequent urinary tract infections (UTIs), blood in your urine, or a stone in one of your ureters. A ureteroscopy can be done:  To find the cause of urine blockage in a ureter and to evaluate other abnormalities inside the ureters or kidneys.  To remove stones.  To remove or treat growths of tissue (polyps), abnormal tissue, and some types of tumors.  To remove a tissue sample and check it for disease under a microscope (biopsy). Tell a health care provider about:  Any allergies you have.  All medicines you are taking, including vitamins, herbs, eye drops, creams, and over-the-counter medicines.  Any problems you or family members have had with anesthetic medicines.  Any blood disorders you have.  Any surgeries you have had.  Any medical conditions you have.  Whether you are pregnant or may be pregnant. What are the risks? Generally, this is a safe procedure. However, problems may occur, including:  Bleeding.  Infection.  Allergic reactions to medicines.  Scarring that narrows the ureter (stricture).  Creating a hole in the ureter (perforation). What happens before the procedure? Staying hydrated Follow instructions from your health care provider about hydration, which may include:  Up to 2 hours before the procedure - you may continue to drink clear liquids, such as water, clear fruit juice, black coffee, and plain tea.   Eating and drinking restrictions Follow instructions from your health care provider about eating and drinking, which may include:  8  hours before the procedure - stop eating heavy meals or foods, such as meat, fried foods, or fatty foods.  6 hours before the procedure - stop eating light meals or foods, such as toast or cereal.  6 hours before the procedure - stop drinking milk or drinks that contain milk.  2 hours before the procedure - stop drinking clear liquids. Medicines Ask your health care provider about:  Changing or stopping your regular medicines. This is especially important if you are taking diabetes medicines or blood thinners.  Taking medicines such as aspirin and ibuprofen. These medicines can thin your blood. Do not take these medicines unless your health care provider tells you to take them.  Taking over-the-counter medicines, vitamins, herbs, and supplements. General instructions  Do not use any products that contain nicotine or tobacco for at least 4 weeks before the procedure. These products include cigarettes, e-cigarettes, and chewing tobacco. If you need help quitting, ask your health care provider.  You may have a urine sample taken to check for infection.  Plan to have someone take you home from the hospital or clinic.  If you will be going home right after the procedure, plan to have someone with you for 24 hours.  Ask your health care provider what steps will be taken to help prevent infection. These may include: ? Washing skin with a germ-killing soap. ? Receiving antibiotic medicine. What happens during the procedure?  An IV will be inserted into one of your veins.  You will be given one or more of the following: ? A medicine to help  you relax (sedative). ? A medicine to make you fall asleep (general anesthetic). ? A medicine that is injected into your spine to numb the area below and slightly above the injection site (spinal anesthetic).  The part of your body that drains urine from your bladder (urethra) will be cleaned with a germ-killing solution.  The ureteroscope will be  passed through your urethra into your bladder.  A salt-water solution will be sent through the ureteroscope to fill your bladder. This will help the health care provider see the openings of your ureters more clearly.  The ureteroscope will be passed into your ureter. ? If a growth is found, a biopsy may be done. ? If a stone is found, it may be removed through the ureteroscope, or the stone may be broken up using a laser, shock waves, or electrical energy. ? In some cases, if the ureter is too small, a tube may be inserted that keeps the ureter open (ureteral stent). The stent may be left in place for 1 or 2 weeks to keep the ureter open, and then the ureteroscopy procedure will be done.  The scope will be removed, and your bladder will be emptied. The procedure may vary among health care providers and hospitals.   What can I expect after the procedure? After your procedure, it is common to have:  Your blood pressure, heart rate, breathing rate, and blood oxygen level monitored until you leave the hospital or clinic.  A burning sensation when you urinate. You may be asked to urinate.  Blood in your urine.  Mild discomfort in your bladder area or kidney area when urinating.  A need to urinate more often or urgently. Follow these instructions at home: Medicines  Take over-the-counter and prescription medicines only as told by your health care provider.  If you were prescribed an antibiotic medicine, take it as told by your health care provider. Do not stop taking the antibiotic even if you start to feel better. General instructions  If you were given a sedative during the procedure, it can affect you for several hours. Do not drive or operate machinery until your health care provider says that it is safe.  To relieve burning, take a warm bath or hold a warm washcloth over your groin.  Drink enough fluid to keep your urine pale yellow. ? Drink two 8-ounce (237 mL) glasses of water  every hour for the first 2 hours after you get home. ? Continue to drink water often at home.  You can eat what you normally do.  Keep all follow-up visits as told by your health care provider. This is important. ? If you had a ureteral stent placed, ask your health care provider when you need to return to have it removed.   Contact a health care provider if you have:  Chills or a fever.  Burning pain for longer than 24 hours after the procedure.  Blood in your urine for longer than 24 hours after the procedure. Get help right away if you have:  Large amounts of blood in your urine.  Blood clots in your urine.  Severe pain.  Chest pain or trouble breathing.  The feeling of a full bladder and you are unable to urinate. These symptoms may represent a serious problem that is an emergency. Do not wait to see if the symptoms will go away. Get medical help right away. Call your local emergency services (911 in the [place] Summary  Ureteroscopy is a procedure to  check for and treat problems inside part of the urinary tract.  In this procedure, a thin, flexible tube with a light at the end (ureteroscope) is used to look at the inside of the kidneys and the ureters.  You may need this procedure if you have frequent urinary tract infections (UTIs), blood in your urine, or a stone in a ureter. This information is not intended to replace advice given to you by your health care provider. Make sure you discuss any questions you have with your health care provider. Document Revised: 04/09/2019 Document Reviewed: 04/09/2019 Elsevier Patient Education  2021 [employer] [facility], Adult, Care After This sheet gives you information about how to care for yourself after your procedure. Your health care provider may also give you more specific instructions. If you have problems or questions, contact your health care provider. What can I expect after the procedure? After the  procedure, the following side effects are common:  Pain or discomfort at the IV site.  Nausea.  Vomiting.  Sore throat.  Trouble concentrating.  Feeling cold or chills.  Feeling weak or tired.  Sleepiness and fatigue.  Soreness and body aches. These side effects can affect parts of the body that were not involved in surgery. Follow these instructions at home: For the time period you were told by your health care provider:  Rest.  Do not participate in activities where you could fall or become injured.  Do not drive or use machinery.  Do not drink alcohol.  Do not take sleeping pills or medicines that cause drowsiness.  Do not make important decisions or sign legal documents.  Do not take care of children on your own.   Eating and drinking  Follow any instructions from your health care provider about eating or drinking restrictions.  When you feel hungry, start by eating small amounts of foods that are soft and easy to digest (bland), such as toast. Gradually return to your regular diet.  Drink enough fluid to keep your urine pale yellow.  If you vomit, rehydrate by drinking water, juice, or clear broth. General instructions  If you have sleep apnea, surgery and certain medicines can increase your risk for breathing problems. Follow instructions from your health care provider about wearing your sleep device: ? Anytime you are sleeping, including during daytime naps. ? While taking prescription pain medicines, sleeping medicines, or medicines that make you drowsy.  Have a responsible adult stay with you for the time you are told. It is important to have someone help care for you until you are awake and alert.  Return to your normal activities as told by your health care provider. Ask your health care provider what activities are safe for you.  Take over-the-counter and prescription medicines only as told by your health care provider.  If you smoke, do not smoke  without supervision.  Keep all follow-up visits as told by your health care provider. This is important. Contact a health care provider if:  You have nausea or vomiting that does not get better with medicine.  You cannot eat or drink without vomiting.  You have pain that does not get better with medicine.  You are unable to pass urine.  You develop a skin rash.  You have a fever.  You have redness around your IV site that gets worse. Get help right away if:  You have difficulty breathing.  You have chest pain.  You have blood in your urine  or stool, or you vomit blood. Summary  After the procedure, it is common to have a sore throat or nausea. It is also common to feel tired.  Have a responsible adult stay with you for the time you are told. It is important to have someone help care for you until you are awake and alert.  When you feel hungry, start by eating small amounts of foods that are soft and easy to digest (bland), such as toast. Gradually return to your regular diet.  Drink enough fluid to keep your urine pale yellow.  Return to your normal activities as told by your health care provider. Ask your health care provider what activities are safe for you. This information is not intended to replace advice given to you by your health care provider. Make sure you discuss any questions you have with your health care provider. Document Revised: 03/18/2020 Document Reviewed: 10/16/2019 Elsevier Patient Education  2021 Else[employer] Inc.    Acetaminophen; Oxycodone tablets What is this medicine? ACETAMINOPHEN; OXYCODONE (a set a MEE noe fen; ox i KOE done) is a pain reliever. It is used to treat moderate to severe pain. This medicine may be used for other purposes; ask your health care provider or pharmacist if you have questions. COMMON BRAND NAME(S): Endocet, Magnacet, Nalocet, Narvox, Percocet, Perloxx, Primalev, Primlev, Prolate, Roxicet, Xolox What should I tell my  health care provider before I take this medicine? They need to know if you have any of these conditions:  brain tumor  drug abuse or addiction  head injury  heart disease  if you often drink alcohol   kidney disease   liver disease  low adrenal gland function  lung disease, asthma, or breathing problem  seizures  stomach or intestine problems  taken an MAOI like Marplan, Nardil, or Parnate in the last 14 days  an unusual or allergic reaction to acetaminophen, oxycodone, other medicines, foods, dyes, or preservative  pregnant or trying to get pregnant  breast-feeding How should I use this medicine? Take this medicine by mouth with a full glass of water. Take it as directed on the label. You can take it with or without food. If it upsets your stomach, take it with food. Do not use it more often than directed. There may be unused or extra doses in the bottle after you finish your treatment. Talk to your health care provider if you have questions about your dose. A special MedGuide will be given to you by the pharmacist with each prescription and refill. Be sure to read this information carefully each time. Talk to your health care provider about the use of this medicine in children. Special care may be needed. Patients over 65 years of age may have a stronger reaction and need a smaller dose. Overdosage: If you think you have taken too much of this medicine contact a poison control center or emergency room at once. NOTE: This medicine is only for you. Do not share this medicine with others. What if I miss a dose? This does not apply. This medicine is not for regular use. It should only be used as needed. What may interact with this medicine? This medicine may interact with the following medications:  alcohol  antihistamines for allergy, cough and cold  antiviral medicines for HIV or AIDS  atropine  certain antibiotics like clarithromycin, erythromycin, linezolid,  rifampin  certain medicines for anxiety or sleep  certain medicines for bladder problems like oxybutynin, tolterodine  certain medicines for depression   like amitriptyline, fluoxetine, sertraline  certain medicines for fungal infections like ketoconazole, itraconazole, voriconazole  certain medicines for migraine headache like almotriptan, eletriptan, frovatriptan, naratriptan, rizatriptan, sumatriptan, zolmitriptan  certain medicines for nausea or vomiting like dolasetron, ondansetron, palonosetron  certain medicines for Parkinson's disease like benztropine, trihexyphenidyl  certain medicines for seizures like phenobarbital, phenytoin, primidone  certain medicines for stomach problems like dicyclomine, hyoscyamine  certain medicines for travel sickness like scopolamine  diuretics  general anesthetics like halothane, isoflurane, methoxyflurane, propofol  ipratropium  local anesthetics like lidocaine, pramoxine, tetracaine  MAOIs like Carbex, Eldepryl, Marplan, Nardil, and Parnate  medicines that relax muscles for surgery  methylene blue  nilotinib  other medicines with acetaminophen  other narcotic medicines for pain or cough  phenothiazines like chlorpromazine, mesoridazine, prochlorperazine, thioridazine This list may not describe all possible interactions. Give your health care provider a list of all the medicines, herbs, non-prescription drugs, or dietary supplements you use. Also tell them if you smoke, drink alcohol, or use illegal drugs. Some items may interact with your medicine. What should I watch for while using this medicine? Tell your health care provider if your pain does not go away, if it gets worse, or if you have new or a different type of pain. You may develop tolerance to this drug. Tolerance means that you will need a higher dose of the drug for pain relief. Tolerance is normal and is expected if you take this drug for a long time. There are different  types of narcotic drugs (opioids) for pain. If you take more than one type at the same time, you may have more side effects. Give your health care provider a list of all drugs you use. He or she will tell you how much drug to take. Do not take more drug than directed. Get emergency help right away if you have problems breathing. Do not suddenly stop taking your drug because you may develop a severe reaction. Your body becomes used to the drug. This does NOT mean you are addicted. Addiction is a behavior related to getting and using a drug for a nonmedical reason. If you have pain, you have a medical reason to take pain drug. Your health care provider will tell you how much drug to take. If your health care provider wants you to stop the drug, the dose will be slowly lowered over time to avoid any side effects. Talk to your health care provider about naloxone and how to get it. Naloxone is an emergency drug used for an opioid overdose. An overdose can happen if you take too much opioid. It can also happen if an opioid is taken with some other drugs or substances, like alcohol. Know the symptoms of an overdose, like trouble breathing, unusually tired or sleepy, or not being able to respond or wake up. Make sure to tell caregivers and close contacts where it is stored. Make sure they know how to use it. After naloxone is given, you must get emergency help right away. Naloxone is a temporary treatment. Repeat doses may be needed. Do not take other drugs that contain acetaminophen with this drug. Many non-prescription drugs contain acetaminophen. Always read labels carefully. If you have questions, ask your health care provider. If you take too much acetaminophen, get medical help right away. Too much acetaminophen can be very dangerous and cause liver damage. Even if you do not have symptoms, it is important to get help right away. This drug does not prevent a heart attack   or stroke. This drug may increase the  chance of a heart attack or stroke. The chance may increase the longer you use this drug or if you have heart disease. If you take aspirin to prevent a heart attack or stroke, talk to your health care provider about using this drug. You may get drowsy or dizzy. Do not drive, use machinery, or do anything that needs mental alertness until you know how this drug affects you. Do not stand up or sit up quickly, especially if you are an older patient. This reduces the risk of dizzy or fainting spells. Alcohol may interfere with the effect of this drug. Avoid alcoholic drinks. This drug will cause constipation. If you do not have a bowel movement for 3 days, call your health care provider. Your mouth may get dry. Chewing sugarless gum or sucking hard candy and drinking plenty of water may help. Contact your health care provider if the problem does not go away or is severe. What side effects may I notice from receiving this medicine? Side effects that you should report to your doctor or health care professional as soon as possible:  allergic reactions (skin rash, itching or hives; swelling of the face, lips, or tongue)  confusion  kidney injury (trouble passing urine or change in the amount of urine)  light-colored stool  liver injury (dark yellow or brown urine; general ill feeling or flu-like symptoms; loss of appetite, right upper belly pain; unusually weak or tired, yellowing of the eyes or skin)  low adrenal gland function (nausea; vomiting; loss of appetite; unusually weak or tired; dizziness; low blood pressure)  low blood pressure (dizziness; feeling faint or lightheaded, falls; unusually weak or tired)  redness, blistering, peeling, or loosening of the skin, including inside the mouth  serotonin syndrome (irritable; confusion; diarrhea; fast or irregular heartbeat; muscle twitching; stiff muscles; trouble walking; sweating; high fever; seizures; chills; vomiting)  trouble breathing Side  effects that usually do not require medical attention (report to your doctor or health care professional if they continue or are bothersome):  constipation  dry mouth  nausea, vomiting  tiredness This list may not describe all possible side effects. Call your doctor for medical advice about side effects. You may report side effects to FDA at 1-800-FDA-1088. Where should I keep my medicine? Keep out of the reach of children and pets. This medicine can be abused. Keep it in a safe place to protect it from theft. Do not share it with anyone. It is only for you. Selling or giving away this medicine is dangerous and against the law. Store at room temperature between 20 and 25 degrees C (68 and 77 degrees F). Protect from light. Get rid of any unused medicine after the expiration date. This medicine may cause harm and death if it is taken by other adults, children, or pets. It is important to get rid of the medicine as soon as you no longer need it or it is expired. You can do this in two ways:  Take the medicine to a medicine take-back program. Check with your pharmacy or law enforcement to find a location.  If you cannot return the medicine, flush it down the toilet. NOTE: This sheet is a summary. It may not cover all possible information. If you have questions about this medicine, talk to your doctor, pharmacist, or health care provider.  2021 Elsevier/[employer] Standard (2020-03-31 11:12:15)  

## 2020-11-26 ENCOUNTER — Encounter (HOSPITAL_COMMUNITY): Payer: Self-pay | Admitting: Urology

## 2020-11-26 LAB — GLUCOSE, CAPILLARY: Glucose-Capillary: 110 mg/dL — ABNORMAL HIGH (ref 70–99)

## 2020-11-29 ENCOUNTER — Telehealth: Payer: Self-pay

## 2020-11-29 NOTE — Telephone Encounter (Signed)
Daughter called this am- patient had surgery last Thursday for stent placement. Daughter reports patient was having periods of hallucinations and family felt related to pain medication. Family stopped pain medication and patient still having times of "seeing things"   Daughter denies fevers or foul smelling urine. Reports blood sugars have been in 200's but family has been giving patient Pedialyte.   Daughter was instructed that if patient mental status continues to be altered patient will need to go to ER for evaluation. Daughter voiced understanding.

## 2020-11-30 ENCOUNTER — Inpatient Hospital Stay (HOSPITAL_COMMUNITY)
Admission: EM | Admit: 2020-11-30 | Discharge: 2020-12-03 | DRG: 871 | Disposition: A | Discharge status: Home Health | Payer: PPO | Attending: Internal Medicine | Admitting: Internal Medicine

## 2020-11-30 ENCOUNTER — Inpatient Hospital Stay (HOSPITAL_COMMUNITY): Payer: PPO

## 2020-11-30 ENCOUNTER — Encounter (HOSPITAL_COMMUNITY): Payer: Self-pay | Admitting: *Deleted

## 2020-11-30 ENCOUNTER — Emergency Department (HOSPITAL_COMMUNITY): Payer: PPO

## 2020-11-30 ENCOUNTER — Other Ambulatory Visit: Payer: Self-pay

## 2020-11-30 DIAGNOSIS — Z1619 Resistance to other specified beta lactam antibiotics: Secondary | ICD-10-CM | POA: Diagnosis not present

## 2020-11-30 DIAGNOSIS — Z20822 Contact with and (suspected) exposure to covid-19: Secondary | ICD-10-CM | POA: Diagnosis present

## 2020-11-30 DIAGNOSIS — E7800 Pure hypercholesterolemia, unspecified: Secondary | ICD-10-CM | POA: Diagnosis not present

## 2020-11-30 DIAGNOSIS — N451 Epididymitis: Secondary | ICD-10-CM | POA: Diagnosis not present

## 2020-11-30 DIAGNOSIS — R9341 Abnormal radiologic findings on diagnostic imaging of renal pelvis, ureter, or bladder: Secondary | ICD-10-CM | POA: Insufficient documentation

## 2020-11-30 DIAGNOSIS — F05 Delirium due to known physiological condition: Secondary | ICD-10-CM | POA: Diagnosis not present

## 2020-11-30 DIAGNOSIS — K219 Gastro-esophageal reflux disease without esophagitis: Secondary | ICD-10-CM | POA: Diagnosis present

## 2020-11-30 DIAGNOSIS — Z87442 Personal history of urinary calculi: Secondary | ICD-10-CM

## 2020-11-30 DIAGNOSIS — I2510 Atherosclerotic heart disease of native coronary artery without angina pectoris: Secondary | ICD-10-CM | POA: Diagnosis not present

## 2020-11-30 DIAGNOSIS — N138 Other obstructive and reflux uropathy: Secondary | ICD-10-CM | POA: Diagnosis not present

## 2020-11-30 DIAGNOSIS — K4090 Unilateral inguinal hernia, without obstruction or gangrene, not specified as recurrent: Secondary | ICD-10-CM | POA: Diagnosis present

## 2020-11-30 DIAGNOSIS — N368 Other specified disorders of urethra: Secondary | ICD-10-CM | POA: Diagnosis present

## 2020-11-30 DIAGNOSIS — R059 Cough, unspecified: Secondary | ICD-10-CM | POA: Diagnosis not present

## 2020-11-30 DIAGNOSIS — E785 Hyperlipidemia, unspecified: Secondary | ICD-10-CM | POA: Diagnosis present

## 2020-11-30 DIAGNOSIS — N401 Enlarged prostate with lower urinary tract symptoms: Secondary | ICD-10-CM | POA: Diagnosis not present

## 2020-11-30 DIAGNOSIS — E875 Hyperkalemia: Secondary | ICD-10-CM | POA: Diagnosis not present

## 2020-11-30 DIAGNOSIS — R338 Other retention of urine: Secondary | ICD-10-CM | POA: Diagnosis present

## 2020-11-30 DIAGNOSIS — N433 Hydrocele, unspecified: Secondary | ICD-10-CM | POA: Diagnosis not present

## 2020-11-30 DIAGNOSIS — E1165 Type 2 diabetes mellitus with hyperglycemia: Secondary | ICD-10-CM

## 2020-11-30 DIAGNOSIS — N1330 Unspecified hydronephrosis: Secondary | ICD-10-CM | POA: Diagnosis not present

## 2020-11-30 DIAGNOSIS — E119 Type 2 diabetes mellitus without complications: Secondary | ICD-10-CM | POA: Diagnosis present

## 2020-11-30 DIAGNOSIS — Z8052 Family history of malignant neoplasm of bladder: Secondary | ICD-10-CM

## 2020-11-30 DIAGNOSIS — E039 Hypothyroidism, unspecified: Secondary | ICD-10-CM | POA: Diagnosis present

## 2020-11-30 DIAGNOSIS — N200 Calculus of kidney: Secondary | ICD-10-CM

## 2020-11-30 DIAGNOSIS — Z79890 Hormone replacement therapy (postmenopausal): Secondary | ICD-10-CM

## 2020-11-30 DIAGNOSIS — Z87891 Personal history of nicotine dependence: Secondary | ICD-10-CM

## 2020-11-30 DIAGNOSIS — N503 Cyst of epididymis: Secondary | ICD-10-CM | POA: Diagnosis not present

## 2020-11-30 DIAGNOSIS — G9341 Metabolic encephalopathy: Secondary | ICD-10-CM | POA: Diagnosis not present

## 2020-11-30 DIAGNOSIS — A419 Sepsis, unspecified organism: Principal | ICD-10-CM | POA: Diagnosis present

## 2020-11-30 DIAGNOSIS — J189 Pneumonia, unspecified organism: Secondary | ICD-10-CM | POA: Diagnosis present

## 2020-11-30 DIAGNOSIS — R531 Weakness: Secondary | ICD-10-CM

## 2020-11-30 DIAGNOSIS — J9811 Atelectasis: Secondary | ICD-10-CM | POA: Diagnosis not present

## 2020-11-30 DIAGNOSIS — I452 Bifascicular block: Secondary | ICD-10-CM | POA: Diagnosis present

## 2020-11-30 DIAGNOSIS — Z7984 Long term (current) use of oral hypoglycemic drugs: Secondary | ICD-10-CM

## 2020-11-30 DIAGNOSIS — Z823 Family history of stroke: Secondary | ICD-10-CM

## 2020-11-30 DIAGNOSIS — Z833 Family history of diabetes mellitus: Secondary | ICD-10-CM | POA: Diagnosis not present

## 2020-11-30 DIAGNOSIS — Z8249 Family history of ischemic heart disease and other diseases of the circulatory system: Secondary | ICD-10-CM

## 2020-11-30 DIAGNOSIS — N179 Acute kidney failure, unspecified: Secondary | ICD-10-CM | POA: Diagnosis not present

## 2020-11-30 DIAGNOSIS — I10 Essential (primary) hypertension: Secondary | ICD-10-CM | POA: Diagnosis not present

## 2020-11-30 DIAGNOSIS — R0602 Shortness of breath: Secondary | ICD-10-CM | POA: Diagnosis not present

## 2020-11-30 DIAGNOSIS — N136 Pyonephrosis: Secondary | ICD-10-CM | POA: Diagnosis not present

## 2020-11-30 DIAGNOSIS — Z8051 Family history of malignant neoplasm of kidney: Secondary | ICD-10-CM

## 2020-11-30 DIAGNOSIS — I708 Atherosclerosis of other arteries: Secondary | ICD-10-CM | POA: Diagnosis not present

## 2020-11-30 DIAGNOSIS — G35 Multiple sclerosis: Secondary | ICD-10-CM | POA: Diagnosis not present

## 2020-11-30 DIAGNOSIS — K753 Granulomatous hepatitis, not elsewhere classified: Secondary | ICD-10-CM | POA: Diagnosis not present

## 2020-11-30 DIAGNOSIS — J986 Disorders of diaphragm: Secondary | ICD-10-CM | POA: Diagnosis not present

## 2020-11-30 DIAGNOSIS — R0902 Hypoxemia: Secondary | ICD-10-CM | POA: Diagnosis present

## 2020-11-30 DIAGNOSIS — Z7982 Long term (current) use of aspirin: Secondary | ICD-10-CM

## 2020-11-30 DIAGNOSIS — N5089 Other specified disorders of the male genital organs: Secondary | ICD-10-CM

## 2020-11-30 DIAGNOSIS — D75839 Thrombocytosis, unspecified: Secondary | ICD-10-CM | POA: Insufficient documentation

## 2020-11-30 DIAGNOSIS — Z79899 Other long term (current) drug therapy: Secondary | ICD-10-CM

## 2020-11-30 DIAGNOSIS — R0603 Acute respiratory distress: Secondary | ICD-10-CM

## 2020-11-30 DIAGNOSIS — N453 Epididymo-orchitis: Secondary | ICD-10-CM | POA: Diagnosis present

## 2020-11-30 LAB — CBC
HCT: 30.7 % — ABNORMAL LOW (ref 39.0–52.0)
Hemoglobin: 10.2 g/dL — ABNORMAL LOW (ref 13.0–17.0)
MCH: 28.8 pg (ref 26.0–34.0)
MCHC: 33.2 g/dL (ref 30.0–36.0)
MCV: 86.7 fL (ref 80.0–100.0)
Platelets: 204 10*3/uL (ref 150–400)
RBC: 3.54 MIL/uL — ABNORMAL LOW (ref 4.22–5.81)
RDW: 15.1 % (ref 11.5–15.5)
WBC: 10.9 10*3/uL — ABNORMAL HIGH (ref 4.0–10.5)
nRBC: 0 % (ref 0.0–0.2)

## 2020-11-30 LAB — COMPREHENSIVE METABOLIC PANEL
ALT: 10 U/L (ref 0–44)
AST: 23 U/L (ref 15–41)
Albumin: 3.2 g/dL — ABNORMAL LOW (ref 3.5–5.0)
Alkaline Phosphatase: 80 U/L (ref 38–126)
Anion gap: 11 (ref 5–15)
BUN: 63 mg/dL — ABNORMAL HIGH (ref 8–23)
CO2: 22 mmol/L (ref 22–32)
Calcium: 9.1 mg/dL (ref 8.9–10.3)
Chloride: 97 mmol/L — ABNORMAL LOW (ref 98–111)
Creatinine, Ser: 3.84 mg/dL — ABNORMAL HIGH (ref 0.61–1.24)
GFR, Estimated: 16 mL/min — ABNORMAL LOW (ref >60)
Glucose, Bld: 215 mg/dL — ABNORMAL HIGH (ref 70–99)
Potassium: 6.2 mmol/L — ABNORMAL HIGH (ref 3.5–5.1)
Sodium: 130 mmol/L — ABNORMAL LOW (ref 135–145)
Total Bilirubin: 0.7 mg/dL (ref 0.3–1.2)
Total Protein: 7.2 g/dL (ref 6.5–8.1)

## 2020-11-30 LAB — BASIC METABOLIC PANEL
Anion gap: 10 (ref 5–15)
BUN: 59 mg/dL — ABNORMAL HIGH (ref 8–23)
CO2: 24 mmol/L (ref 22–32)
Calcium: 9.1 mg/dL (ref 8.9–10.3)
Chloride: 98 mmol/L (ref 98–111)
Creatinine, Ser: 3.51 mg/dL — ABNORMAL HIGH (ref 0.61–1.24)
GFR, Estimated: 18 mL/min — ABNORMAL LOW (ref >60)
Glucose, Bld: 152 mg/dL — ABNORMAL HIGH (ref 70–99)
Potassium: 5.1 mmol/L (ref 3.5–5.1)
Sodium: 132 mmol/L — ABNORMAL LOW (ref 135–145)

## 2020-11-30 LAB — CALCULI, WITH PHOTOGRAPH (CLINICAL LAB)
Uric Acid Calculi: 100 %
Weight Calculi: 40 mg

## 2020-11-30 LAB — TROPONIN I (HIGH SENSITIVITY)
Troponin I (High Sensitivity): 4 ng/L (ref <18)
Troponin I (High Sensitivity): 5 ng/L (ref <18)

## 2020-11-30 LAB — URINALYSIS, ROUTINE W REFLEX MICROSCOPIC
Bilirubin Urine: NEGATIVE
Glucose, UA: NEGATIVE mg/dL
Ketones, ur: NEGATIVE mg/dL
Nitrite: NEGATIVE
Protein, ur: 30 mg/dL — AB
RBC / HPF: >50 RBC/hpf — ABNORMAL HIGH (ref 0–5)
Specific Gravity, Urine: 1.016 (ref 1.005–1.030)
pH: 6 (ref 5.0–8.0)

## 2020-11-30 LAB — CBC WITH DIFFERENTIAL/PLATELET
Abs Immature Granulocytes: 0.09 10*3/uL — ABNORMAL HIGH (ref 0.00–0.07)
Basophils Absolute: 0 10*3/uL (ref 0.0–0.1)
Basophils Relative: 0 %
Eosinophils Absolute: 0.2 10*3/uL (ref 0.0–0.5)
Eosinophils Relative: 1 %
HCT: 33.5 % — ABNORMAL LOW (ref 39.0–52.0)
Hemoglobin: 11 g/dL — ABNORMAL LOW (ref 13.0–17.0)
Immature Granulocytes: 1 %
Lymphocytes Relative: 3 %
Lymphs Abs: 0.4 10*3/uL — ABNORMAL LOW (ref 0.7–4.0)
MCH: 28.3 pg (ref 26.0–34.0)
MCHC: 32.8 g/dL (ref 30.0–36.0)
MCV: 86.1 fL (ref 80.0–100.0)
Monocytes Absolute: 1.5 10*3/uL — ABNORMAL HIGH (ref 0.1–1.0)
Monocytes Relative: 12 %
Neutro Abs: 10.6 10*3/uL — ABNORMAL HIGH (ref 1.7–7.7)
Neutrophils Relative %: 83 %
Platelets: 233 10*3/uL (ref 150–400)
RBC: 3.89 MIL/uL — ABNORMAL LOW (ref 4.22–5.81)
RDW: 15.1 % (ref 11.5–15.5)
WBC: 12.8 10*3/uL — ABNORMAL HIGH (ref 4.0–10.5)
nRBC: 0 % (ref 0.0–0.2)

## 2020-11-30 LAB — CREATININE, SERUM
Creatinine, Ser: 3.31 mg/dL — ABNORMAL HIGH (ref 0.61–1.24)
GFR, Estimated: 19 mL/min — ABNORMAL LOW (ref >60)

## 2020-11-30 MED ORDER — SODIUM CHLORIDE 0.9 % IV SOLN
500.0000 mg | INTRAVENOUS | Status: DC
  Administered 2020-12-01 – 2020-12-02 (×2): 500 mg via INTRAVENOUS
  Filled 2020-11-30 (×2): qty 500

## 2020-11-30 MED ORDER — SODIUM CHLORIDE 0.9 % IV SOLN: INTRAVENOUS | Status: DC

## 2020-11-30 MED ORDER — ISOSORBIDE MONONITRATE ER 60 MG PO TB24
30.0000 mg | ORAL_TABLET | Freq: Every day | ORAL | Status: DC
  Administered 2020-11-30 – 2020-12-03 (×4): 30 mg via ORAL
  Filled 2020-11-30 (×3): qty 1

## 2020-11-30 MED ORDER — SODIUM BICARBONATE 8.4 % IV SOLN
INTRAVENOUS | Status: AC
  Administered 2020-11-30: 50 meq via INTRAVENOUS
  Filled 2020-11-30: qty 50

## 2020-11-30 MED ORDER — LEVOTHYROXINE SODIUM 50 MCG PO TABS
50.0000 ug | ORAL_TABLET | Freq: Every day | ORAL | Status: DC
  Administered 2020-12-01 – 2020-12-03 (×3): 50 ug via ORAL
  Filled 2020-11-30 (×3): qty 1

## 2020-11-30 MED ORDER — INSULIN ASPART 100 UNIT/ML IV SOLN
5.0000 [IU] | Freq: Once | INTRAVENOUS | Status: AC
  Administered 2020-11-30: 5 [IU] via INTRAVENOUS

## 2020-11-30 MED ORDER — SODIUM ZIRCONIUM CYCLOSILICATE 5 G PO PACK
10.0000 g | PACK | Freq: Once | ORAL | Status: AC
  Administered 2020-11-30: 10 g via ORAL
  Filled 2020-11-30: qty 2

## 2020-11-30 MED ORDER — AMLODIPINE BESYLATE 5 MG PO TABS
2.5000 mg | ORAL_TABLET | Freq: Every day | ORAL | Status: DC
  Administered 2020-11-30 – 2020-12-03 (×4): 2.5 mg via ORAL
  Filled 2020-11-30 (×4): qty 1

## 2020-11-30 MED ORDER — SODIUM CHLORIDE 0.9 % IV SOLN
500.0000 mg | Freq: Once | INTRAVENOUS | Status: AC
  Administered 2020-11-30: 500 mg via INTRAVENOUS
  Filled 2020-11-30: qty 500

## 2020-11-30 MED ORDER — ASPIRIN 81 MG PO TBEC: 81.0000 mg | DELAYED_RELEASE_TABLET | Freq: Every day | ORAL | Status: DC

## 2020-11-30 MED ORDER — ASPIRIN EC 81 MG PO TBEC
81.0000 mg | DELAYED_RELEASE_TABLET | Freq: Every day | ORAL | Status: DC
  Administered 2020-12-01 – 2020-12-03 (×3): 81 mg via ORAL
  Filled 2020-11-30 (×3): qty 1

## 2020-11-30 MED ORDER — HEPARIN SODIUM (PORCINE) 5000 UNIT/ML IJ SOLN
5000.0000 [IU] | Freq: Three times a day (TID) | INTRAMUSCULAR | Status: DC
  Administered 2020-11-30 – 2020-12-03 (×9): 5000 [IU] via SUBCUTANEOUS
  Filled 2020-11-30 (×9): qty 1

## 2020-11-30 MED ORDER — ALFUZOSIN HCL ER 10 MG PO TB24
10.0000 mg | ORAL_TABLET | Freq: Every day | ORAL | Status: DC
  Administered 2020-12-01 – 2020-12-03 (×3): 10 mg via ORAL
  Filled 2020-11-30 (×6): qty 1

## 2020-11-30 MED ORDER — TAMSULOSIN HCL 0.4 MG PO CAPS
0.4000 mg | ORAL_CAPSULE | Freq: Every day | ORAL | Status: DC
  Administered 2020-11-30 – 2020-12-02 (×3): 0.4 mg via ORAL
  Filled 2020-11-30 (×3): qty 1

## 2020-11-30 MED ORDER — ACETAMINOPHEN 650 MG RE SUPP: 650.0000 mg | Freq: Four times a day (QID) | RECTAL | Status: DC | PRN

## 2020-11-30 MED ORDER — OMEGA-3-ACID ETHYL ESTERS 1 G PO CAPS
1.0000 g | ORAL_CAPSULE | Freq: Every day | ORAL | Status: DC
  Administered 2020-12-01 – 2020-12-02 (×2): 1 g via ORAL
  Filled 2020-11-30 (×2): qty 1

## 2020-11-30 MED ORDER — INSULIN ASPART 100 UNIT/ML IJ SOLN
0.0000 [IU] | Freq: Three times a day (TID) | INTRAMUSCULAR | Status: DC
  Administered 2020-12-01: 1 [IU] via SUBCUTANEOUS
  Administered 2020-12-01: 2 [IU] via SUBCUTANEOUS
  Administered 2020-12-02: 3 [IU] via SUBCUTANEOUS
  Administered 2020-12-02 (×2): 2 [IU] via SUBCUTANEOUS
  Administered 2020-12-03: 1 [IU] via SUBCUTANEOUS
  Administered 2020-12-03: 3 [IU] via SUBCUTANEOUS

## 2020-11-30 MED ORDER — SODIUM CHLORIDE 0.9 % IV BOLUS
1000.0000 mL | Freq: Once | INTRAVENOUS | Status: AC
  Administered 2020-11-30: 1000 mL via INTRAVENOUS

## 2020-11-30 MED ORDER — ROSUVASTATIN CALCIUM 10 MG PO TABS
5.0000 mg | ORAL_TABLET | Freq: Every day | ORAL | Status: DC
  Administered 2020-12-01 – 2020-12-02 (×2): 5 mg via ORAL
  Filled 2020-11-30 (×2): qty 1

## 2020-11-30 MED ORDER — ACETAMINOPHEN 325 MG PO TABS
650.0000 mg | ORAL_TABLET | Freq: Four times a day (QID) | ORAL | Status: DC | PRN
  Administered 2020-12-02: 650 mg via ORAL
  Filled 2020-11-30: qty 2

## 2020-11-30 MED ORDER — PANTOPRAZOLE SODIUM 40 MG PO TBEC
40.0000 mg | DELAYED_RELEASE_TABLET | Freq: Every day | ORAL | Status: DC
  Administered 2020-11-30 – 2020-12-03 (×4): 40 mg via ORAL
  Filled 2020-11-30 (×4): qty 1

## 2020-11-30 MED ORDER — ONDANSETRON HCL 4 MG PO TABS: 4.0000 mg | ORAL_TABLET | Freq: Four times a day (QID) | ORAL | Status: DC | PRN

## 2020-11-30 MED ORDER — SODIUM CHLORIDE 0.9 % IV SOLN
2.0000 g | INTRAVENOUS | Status: DC
  Administered 2020-12-01: 2 g via INTRAVENOUS
  Filled 2020-11-30: qty 20

## 2020-11-30 MED ORDER — SODIUM BICARBONATE 8.4 % IV SOLN: 50.0000 meq | Freq: Once | INTRAVENOUS | Status: AC

## 2020-11-30 MED ORDER — ONDANSETRON HCL 4 MG/2ML IJ SOLN: 4.0000 mg | Freq: Four times a day (QID) | INTRAMUSCULAR | Status: DC | PRN

## 2020-11-30 MED ORDER — CALCIUM GLUCONATE-NACL 1-0.675 GM/50ML-% IV SOLN
1.0000 g | Freq: Once | INTRAVENOUS | Status: AC
  Administered 2020-11-30: 1000 mg via INTRAVENOUS
  Filled 2020-11-30: qty 50

## 2020-11-30 MED ORDER — LORAZEPAM 2 MG/ML IJ SOLN
2.0000 mg | Freq: Once | INTRAMUSCULAR | Status: AC
  Administered 2020-11-30: 2 mg via INTRAVENOUS
  Filled 2020-11-30: qty 1

## 2020-11-30 MED ORDER — DEXTROSE 50 % IV SOLN
1.0000 | Freq: Once | INTRAVENOUS | Status: AC
  Administered 2020-11-30: 50 mL via INTRAVENOUS
  Filled 2020-11-30: qty 50

## 2020-11-30 MED ORDER — SODIUM CHLORIDE 0.9 % IV SOLN
1.0000 g | Freq: Once | INTRAVENOUS | Status: AC
  Administered 2020-11-30: 1 g via INTRAVENOUS
  Filled 2020-11-30: qty 10

## 2020-11-30 MED ORDER — CHLORHEXIDINE GLUCONATE CLOTH 2 % EX PADS
6.0000 | MEDICATED_PAD | Freq: Every day | CUTANEOUS | Status: DC
  Administered 2020-12-01 – 2020-12-03 (×3): 6 via TOPICAL

## 2020-11-30 NOTE — ED Triage Notes (Signed)
Pt's daughter brought pt in from home with c/o generalized weakness, confusion and low grade temp of 99.6. Pt had right urinary stent placed last week due to kidney stones and daughter reports he is becoming weaker since then.

## 2020-11-30 NOTE — Progress Notes (Signed)
Patient noted to be pulling out foley and attempting to climb out of bed. Redirection ineffective. Mitts placed on patient.

## 2020-11-30 NOTE — Plan of Care (Signed)

## 2020-11-30 NOTE — ED Notes (Signed)
Pt rectal temp 99.4

## 2020-11-30 NOTE — Progress Notes (Signed)
Patient continue to pull at devices, Updated wife via telephone. Patient remaining confused reorientation ineffective. Wife unable to calm patient down via telephone "states that is not my wife". Patient demanding to speak to the charge. Patient states 'you tying me down" in regards to safety mitts. Notified Hospitalist orders received and implemented.

## 2020-11-30 NOTE — ED Provider Notes (Signed)
[facility] Provider Note   CSN: [id number] Arrival date & time: 11/30/20  0840     History Chief Complaint  Patient presents with  . Weakness    Jacob Fowler is a 70 y.o. male with past medical history of HTN, HLD, MS, nonobstructive CAD, type II DM, and nephrolithiasis s/p left ureteroscopic stone extraction performed 11/25/2020 by Dr. Alyson Ingles who presents the ED accompanied by his daughter for increasing weakness and confusion.  On my examination, patient is demonstrating mildly increased work of breathing.  He states that he has had a cough x3 days with associated shortness of breath.  Patient does not smoke tobacco he denies any prior oxygen requirements.  He lives with his wife who helps care for him and his daughter is at bedside.  He is typically ambulatory with walker/cane, but has not been able to move well since discharge from the hospital.  He has chronic right leg weakness, however he is now complaining of generalized weakness.  Daughter is concerned for infection.  She also states that he has not had a bowel movement since before the surgery performed 5 days ago.  He is however continuing to pass flatulence.  She states that he is eating and drinking well.  He had 1 episode of nonbloody emesis 3 days ago.  Denies current nausea.  He feels confused, weak, and states that he is seeing spots on the wall.  Daughter states that he had also complained about seeing insects on the walls.  His attention wax and wanes.  She states that he was discharged home with narcotic pain medication which he discontinued 3 days ago thinking that perhaps that was causing his confusion/weakness.  However, his symptoms have persisted.  This morning he had a low-grade temperature of 99.6 F at home.  He denies any chest pain, back pain, ongoing nausea symptoms, inability to eat or drink, or urinary symptoms.  HPI     Past Medical History:  Diagnosis Date  . Acquired leg length  discrepancy   . Bladder stone   . BPH associated with nocturia   . CAD (coronary artery disease) CARDIOLOGIST- DR HOCHREIN--  LAST VISIT 09-27-2011 IN EPIC   Non obstructive Cath 2010-  HX CORONARY SPASM 2004  . Cataract    bilateral   . GERD (gastroesophageal reflux disease)   . H/O hiatal hernia   . Heart murmur   . Heartburn   . High cholesterol   . History of nephrolithiasis 2009  . Hx of adenomatous colonic polyps   . Hyperlipidemia   . Hypertension   . Movement disorder   . Multiple sclerosis ([place]) DX  10/10---  NEUROLOGIST  DR Delfino Lovett [facility] ([place])   RIGHT SIDE AFFECTED MORE W/ WEAKNESS- - USES CANE  . Neuromuscular disorder (HCC)    MS  . NIDDM (non-insulin dependent diabetes mellitus)   . Peyronie's disease   . RBBB   . Renal stone RIGHT  . Rosacea   . Vision abnormalities     Patient Active Problem List   Diagnosis Date Noted  . Nephrolithiasis 08/30/2020  . Hypothyroidism 07/28/2020  . Gastroesophageal reflux disease 07/28/2020  . B12 deficiency 07/28/2020  . Elevated PSA 02/26/2020  . Nocturia 02/26/2020  . Multiple falls 02/09/2020  . Memory loss 08/11/2019  . Vitamin D deficiency 04/25/2019  . Irritable bowel syndrome with constipation 04/25/2019  . RBBB 08/14/2018  . Combined forms of age-related cataract of right eye 08/30/2017  . Thrombocytopenia ([place])  05/18/2017  . Aortic atherosclerosis ([place]) 04/10/2016  . Rosacea 11/23/2015  . Urinary dysfunction 08/19/2015  . Right foot drop 02/17/2015  . Urinary disorder 02/17/2015  . Other fatigue 02/17/2015  . 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 Bladder disorder 09/16/2014  . Multiple sclerosis ([place]) 01/06/2013  . CORONARY ATHEROSCLEROSIS, NATIVE VESSEL 10/06/2009  . Diabetes mellitus type 2 in nonobese ([facility]) 10/01/2009  . Hyperlipidemia associated with type 2 diabetes mellitus ([place]) 10/01/2009  . Hypertension associated with type 2 diabetes mellitus ([place]) 10/01/2009    Past Surgical History:  Procedure  Laterality Date  . CARDIAC CATHETERIZATION  02-26-2009 ---  DR Ida Rogue   NONOBSTRUCTIVE CAD/ 50% DISTAL RCA/ 30% LAD / NORMAL LVF  . CYSTOSCOPY W/ RETROGRADES  10/30/2011   Procedure: CYSTOSCOPY WITH RETROGRADE PYELOGRAM;  Surgeon: Claybon Jabs, MD;  Location: [facility];  Service: Urology;  Laterality: Right;  . CYSTOSCOPY WITH LITHOLAPAXY  10/30/2011   Procedure: CYSTOSCOPY WITH LITHOLAPAXY;  Surgeon: Claybon Jabs, MD;  Location: [facility];  Service: Urology;  Laterality: N/A;  . CYSTOSCOPY WITH RETROGRADE PYELOGRAM, URETEROSCOPY AND STENT PLACEMENT Left 11/25/2020   Procedure: CYSTOSCOPY WITH LEFT RETROGRADE PYELOGRAM, LEFT URETEROSCOPY WITH LASER AND LEFT URETERAL STENT PLACEMENT;  Surgeon: Cleon Gustin, MD;  Location: [facility];  Service: Urology;  Laterality: Left;  . EXTRACORPOREAL SHOCK WAVE LITHOTRIPSY  09-05-2010   RIGHT  . EYE SURGERY Right 08/30/2018  . HOLMIUM LASER APPLICATION Left 5/46/5681   Procedure: HOLMIUM LASER APPLICATION;  Surgeon: Cleon Gustin, MD;  Location: [facility];  Service: Urology;  Laterality: Left;  . NESBIT PROCEDURE  10-17-2004   CORRECTION OF PENILE ANGULATION (PEYRONIES DISEASE)       Family History  Problem Relation Age of Onset  . Stroke Mother   . Heart attack Mother   . Diabetes Mother   . Hypertension Mother   . Renal cancer Father   . Diabetes Sister   . Heart attack Brother   . Diabetes Son   . Heart attack Maternal Grandfather   . Heart attack Brother   . Bladder Cancer Brother     Social History   Tobacco Use  . Smoking status: Former Smoker    Packs/day: 1.00    Years: 7.00    Pack years: 7.00    Types: Cigarettes    Quit date: 09/26/1973    Years since quitting: 47.2  . Smokeless tobacco: Current User    Types: Chew  . Tobacco comment: Chewing tobacco  Vaping Use  . Vaping Use: Never used  Substance Use Topics  . Alcohol use: No    Alcohol/week: 0.0 standard drinks  .  Drug use: No    Home Medications Prior to Admission medications   Medication Sig Start Date End Date Taking? Authorizing Provider  alfuzosin (UROXATRAL) 10 MG 24 hr tablet Take 1 tablet (10 mg total) by mouth in the morning and at bedtime. 10/15/20  Yes McKenzie, Candee Furbish, MD  amLODipine (NORVASC) 2.5 MG tablet Take 1 tablet (2.5 mg total) by mouth daily. 07/28/20  Yes Hendricks Limes F, FNP  aspirin 81 MG EC tablet Take 81 mg by mouth daily.   Yes [provider]  BETASERON 0.3 MG KIT injection Inject 0.25 mg into the skin every other day. Patient taking differently: Inject 0.3 mg into the skin every 7 (seven) days. 09/11/19  Yes Sater, Nanine Means, MD  cholecalciferol (VITAMIN D) 1000 UNITS tablet Take 1,000 Units by mouth daily.  Yes [provider]  cyanocobalamin (,VITAMIN B-12,) 1000 MCG/ML injection INJECT 1ML INTO THE SKIN ONCE MONTH Patient taking differently: Inject 1,000 mcg into the skin every 30 (thirty) days. 08/31/20  Yes Hendricks Limes F, FNP  doxycycline (VIBRAMYCIN) 100 MG capsule Take 1 capsule (100 mg total) by mouth daily. Patient taking differently: Take 100 mg by mouth every other day. 07/28/20  Yes Hendricks Limes F, FNP  fish oil-omega-3 fatty acids 1000 MG capsule Take 1 g by mouth at bedtime.   Yes [provider]  glucose blood test strip Use as instructed 07/06/14  Yes Chipper Herb, MD  isosorbide mononitrate (IMDUR) 60 MG 24 hr tablet Take 0.5 tablets (30 mg total) by mouth daily. 07/28/20  Yes Hendricks Limes F, FNP  levothyroxine (SYNTHROID) 50 MCG tablet Take 1 tablet (50 mcg total) by mouth daily before breakfast. 07/28/20  Yes Hendricks Limes F, FNP  lisinopril (ZESTRIL) 10 MG tablet Take 1 tablet (10 mg total) by mouth daily. 07/28/20  Yes Hendricks Limes F, FNP  metFORMIN (GLUCOPHAGE) 1000 MG tablet Take 1 tablet (1,000 mg total) by mouth 2 (two) times daily with a meal. 07/28/20  Yes [name], FNP  omeprazole (PRILOSEC) 20 MG capsule  Take 1 capsule (20 mg total) by mouth daily. 07/28/20  Yes Hendricks Limes F, FNP  pioglitazone (ACTOS) 30 MG tablet Take 1 tablet (30 mg total) by mouth daily. 07/28/20  Yes Hendricks Limes F, FNP  Potassium Citrate 15 MEQ (1620 MG) TBCR Take 1 tablet by mouth in the morning and at bedtime. 11/25/20  Yes McKenzie, Candee Furbish, MD  rosuvastatin (CRESTOR) 10 MG tablet Take 1 tablet (10 mg total) by mouth at bedtime. 07/28/20  Yes Hendricks Limes F, FNP  SYRINGE-NEEDLE, DISP, 3 ML (B-D SYRINGE/NEEDLE 3CC/25GX5/8) 25G X 5/8" 3 ML MISC Use to give once a month B12 injections 12/28/17  Yes Chipper Herb, MD  tamsulosin (FLOMAX) 0.4 MG CAPS capsule Take 1 capsule (0.4 mg total) by mouth daily after supper. 07/28/20  Yes Hendricks Limes F, FNP  oxyCODONE-acetaminophen (PERCOCET) 5-325 MG tablet Take 1 tablet by mouth every 4 (four) hours as needed. Patient not taking: Reported on 11/30/2020 11/25/20 11/25/21  Cleon Gustin, MD  atorvastatin (LIPITOR) 10 MG tablet Take 10 mg by mouth daily.  09/27/11  [provider]  niacin-simvastatin (SIMCOR) 500-20 MG 24 hr tablet Take 1 tablet by mouth at bedtime.    09/27/11  [provider]    Allergies    Patient has no known allergies.  Review of Systems   Review of Systems  All other systems reviewed and are negative.   Physical Exam Updated Vital Signs BP (!) 141/69 (BP Location: Right Arm)   Pulse 82   Temp 98.8 F (37.1 C) (Oral)   Resp 18   Ht _0  (1.676 m)   Wt 72.6 kg   SpO2 92%   BMI 25.82 kg/m   Physical Exam Vitals and nursing note reviewed. Exam conducted with a chaperone present.  Constitutional:      General: He is not in acute distress.    Appearance: He is ill-appearing.  HENT:     Head: Normocephalic and atraumatic.  Eyes:     General: No scleral icterus.    Conjunctiva/sclera: Conjunctivae normal.  Cardiovascular:     Rate and Rhythm: Normal rate.     Pulses: Normal pulses.  Pulmonary:     Breath sounds:  Normal breath sounds.  Comments: Mildly increased work of breathing.  CTA bilaterally.  No distress.  94% on room air. Abdominal:     General: Abdomen is flat.     Tenderness: There is abdominal tenderness.     Comments: Generalized tenderness to palpation.  No guarding.  No overlying skin changes.  Genitourinary:    Comments: Scrotum is erythematous, distended.  Firm, mostly on left side.  Not particularly tender. Skin:    General: Skin is dry.  Neurological:     Mental Status: He is alert.     GCS: GCS eye subscore is 4. GCS verbal subscore is 5. GCS motor subscore is 6.  Psychiatric:        Mood and Affect: Mood normal.        Behavior: Behavior normal.        Thought Content: Thought content normal.     ED Results / Procedures / Treatments   Labs (all labs ordered are listed, but only abnormal results are displayed) Labs Reviewed  CBC WITH DIFFERENTIAL/PLATELET - Abnormal; Notable for the following components:      Result Value   WBC 12.8 (*)    RBC 3.89 (*)    Hemoglobin 11.0 (*)    HCT 33.5 (*)    Neutro Abs 10.6 (*)    Lymphs Abs 0.4 (*)    Monocytes Absolute 1.5 (*)    Abs Immature Granulocytes 0.09 (*)    All other components within normal limits  COMPREHENSIVE METABOLIC PANEL - Abnormal; Notable for the following components:   Sodium 130 (*)    Potassium 6.2 (*)    Chloride 97 (*)    Glucose, Bld 215 (*)    BUN 63 (*)    Creatinine, Ser 3.84 (*)    Albumin 3.2 (*)    GFR, Estimated 16 (*)    All other components within normal limits  URINALYSIS, ROUTINE W REFLEX MICROSCOPIC - Abnormal; Notable for the following components:   Hgb urine dipstick LARGE (*)    Protein, ur 30 (*)    Leukocytes,Ua MODERATE (*)    RBC / HPF >50 (*)    Bacteria, UA RARE (*)    All other components within normal limits  URINE CULTURE  TROPONIN I (HIGH SENSITIVITY)  TROPONIN I (HIGH SENSITIVITY)    EKG EKG Interpretation  Date/Time:  Tuesday Nov 30 2020 09:33:04  EDT Ventricular Rate:  85 PR Interval:  178 QRS Duration: 136 QT Interval:  356 QTC Calculation: 424 R Axis:   -61 Text Interpretation: Sinus rhythm RBBB and LAFB No significant change since last tracing Confirmed by Aletta Edouard [phone]) on 11/30/2020 9:35:28 AM   Radiology CT ABDOMEN PELVIS WO CONTRAST  Result Date: 11/30/2020 CLINICAL DATA:  Abdominal pain, fever. Left retrograde ureteral stent placement on 11/25/2020 EXAM: CT ABDOMEN AND PELVIS WITHOUT CONTRAST TECHNIQUE: Multidetector CT imaging of the abdomen and pelvis was performed following the standard protocol without IV contrast. COMPARISON:  CT 02/19/2014 FINDINGS: Lower chest: Dependent bibasilar consolidation and/or atelectasis. Heart size within normal limits. Coronary artery calcification. Hepatobiliary: Unremarkable unenhanced appearance of the liver. No focal liver lesion identified. Gallbladder within normal limits. No hyperdense gallstone. No biliary dilatation. Pancreas: Unremarkable. No pancreatic ductal dilatation or surrounding inflammatory changes. Spleen: Normal in size without focal abnormality. Adrenals/Urinary Tract: Unremarkable adrenal glands. Left-sided double-J nephroureteral stent is in place and appears appropriately positioned. Mild left hydronephrosis. There are multiple small stones or stone fragments within the left kidney. Simple fluid attenuation collection within the retroperitoneum  adjacent to the left ureter and extending into patient's left inguinal hernia. Collection measures approximately 18.0 x 4.3 x 6.3 cm (series 7, image 90; series 3, image 64). Mild left-sided perinephric stranding. Multiple punctate nonobstructing stones within the right kidney, largest measuring up to 5 mm. No right-sided hydronephrosis. Urinary bladder within normal limits. Stomach/Bowel: Small paraesophageal hernia versus distal esophageal diverticulum. Stomach within normal limits. No dilated loops of small bowel. Moderate  volume of stool throughout the colon. No focal bowel wall thickening or inflammatory changes. A normal appendix is present in the right lower quadrant. Vascular/Lymphatic: Atherosclerotic calcifications throughout the aortoiliac axis. No aneurysm. No abdominopelvic lymphadenopathy. Reproductive: Prostate is unremarkable. Other: No pneumoperitoneum. Left inguinal hernia with retroperitoneal fluid tracking into the hernia sac. Small left-sided hydrocele. Mild subcutaneous edema surrounds the left hemiscrotum. No soft tissue gas. Musculoskeletal: Nonspecific soft tissue calcifications overlie the bilateral gluteal regions and lateral aspect of the proximal thighs. No acute osseous findings. Mild degenerative changes of the lumbar spine. IMPRESSION: 1. Left-sided double-J nephroureteral stent is in place and appears appropriately positioned. Mild left hydronephrosis. 2. Large simple fluid attenuation collection within the retroperitoneum adjacent to the left ureter and extending into patient's left inguinal hernia. Collection measures approximately 18.0 x 4.3 x 6.3 cm. 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 3. Bilateral nephrolithiasis. 4. Dependent bibasilar consolidation and/or atelectasis. 5. Small left-sided hydrocele. Mild subcutaneous edema surrounding the left hemiscrotum, which may be reactive. Correlate with physical exam to exclude cellulitis. No soft tissue gas. Aortic Atherosclerosis (ICD10-I70.0). Electronically Signed   By: Davina Poke D.O.   On: 11/30/2020 13:01   DG Chest Portable 1 View  Result Date: 11/30/2020 CLINICAL DATA:  Cough and shortness of breath EXAM: PORTABLE CHEST 1 VIEW COMPARISON:  March 05, 2018 FINDINGS: There is patchy airspace opacity with atelectasis in the right base. Lungs elsewhere clear. Heart size and pulmonary vascularity are normal. No adenopathy. No bone lesions. IMPRESSION: Ill-defined airspace opacity, likely representing combination of pneumonia and  atelectasis right base. Lungs elsewhere clear. Cardiac silhouette within normal limits. Electronically Signed   By: Lowella Grip III M.D.   On: 11/30/2020 09:43    Procedures Procedures   Medications Ordered in ED Medications  cefTRIAXone (ROCEPHIN) 1 g in sodium chloride 0.9 % 100 mL IVPB (has no administration in time range)  azithromycin (ZITHROMAX) 500 mg in sodium chloride 0.9 % 250 mL IVPB (has no administration in time range)  sodium chloride 0.9 % bolus 1,000 mL (has no administration in time range)  sodium zirconium cyclosilicate (LOKELMA) packet 10 g (10 g Oral Given 11/30/20 1230)    ED Course  I have reviewed the triage vital signs and the nursing notes.  Pertinent labs & imaging results that were available during my care of the patient were reviewed by me and considered in my medical decision making (see chart for details).  Clinical Course as of 11/30/20 1330  Tue Nov 30, 4748  [id number] 70 year old male here with fever weakness scrotal enlargement after recent ureteral stone removal and stenting.  Chest x-ray showing possible pneumonia.  He has a new AKI and CT showing possible uroma.  Will need to review with urology and needs admission. [MB]  5638 I spoke with Dr. Alyson Ingles, urology.  He states that patient will need to be admitted to the hospitalist and that he will round on him.  At some point he will need to be transferred to [facility] to have nephrostomy tube placed by IR and  then he will be returned back to hospitalist services [facility]. [GG]    Clinical Course User Index [GG] Corena Herter, PA-C [MB] Hayden Rasmussen, MD   MDM Rules/Calculators/A&P                          Jacob Fowler was evaluated in Emergency Department on 11/30/2020 for the symptoms described in the history of present illness. He was evaluated in the context of the global COVID-19 pandemic, which necessitated consideration that the patient might be at risk for infection with the  SARS-CoV-2 virus that causes COVID-19. Institutional protocols and algorithms that pertain to the evaluation of patients at risk for COVID-19 are in a state of rapid change based on information released by regulatory bodies including the CDC and federal and state organizations. These policies and algorithms were followed during the patient's care in the ED.  I personally reviewed patient's medical chart and all notes from triage and staff during today's encounter. I have also ordered and reviewed all labs and imaging that I felt to be medically necessary in the evaluation of this patient's complaints and with consideration of their physical exam. If needed, translation services were available and utilized.   Patient with altered mental status and generalized weakness s/p left-sided ureteroscopic stone extraction.  We will proceed with chest x-ray, EKG, and laboratory work-up.  Patient will likely require CT abdomen pelvis given recent surgery and tenderness in addition to lack of BM.  Chest x-ray is personally reviewed and demonstrates ill-defined airspace opacity, likely combination of pneumonia and atelectasis in the right base.  While he is not requiring oxygen, he is demonstrating mild increased work of breathing and states that he feels short of breath.  He has been coughing over the course of the past few days, consistent with x-ray findings.  Given his age and confusion, he will benefit from admission for IV antibiotics.  EKG without significant changes when compared to prior tracings.  Repeat temperature here in the ED was reassuring.  Basic laboratory work-up  Patient CMP notable for AKI with creatinine elevated at 3.84 new hyperkalemia to 6.2.  CBC with mild leukocytosis to 12.8.  UA with large hematuria, 21-50 WBC but rare bacteria.  Sperm and uric acid crystals present.  CT obtained of the abdomen and pelvis demonstrate ureteric stent is in place, but there is a large simple fluid attenuation  collection within the retroperitoneum adjacent to the left ureter and extending into patient's left inguinal hernia.  There is a small left-sided hydrocele, mild subcutaneous edema which may be reactive.  Will consult urology, Dr. Alyson Ingles, and then admitted to hospitalist for ongoing evaluation and management of his AKI and weakness.    I spoke with Dr. Alyson Ingles, urology.  He states that patient will need to be admitted to the hospitalist and that he will round on him.  At some point he will need to be transferred to [facility] to have nephrostomy tube placed by IR and then he will be returned back to hospitalist services [facility]   Final Clinical Impression(s) / ED Diagnoses Final diagnoses:  Generalized weakness  AKI (acute kidney injury) ([place])  Community acquired pneumonia of right lower lobe of lung    Rx / DC Orders ED Discharge Orders    None       Reita Chard 11/30/20 1330    Hayden Rasmussen, MD 11/30/20 1825

## 2020-11-30 NOTE — Progress Notes (Signed)
Patient appears to be resting at this time.

## 2020-11-30 NOTE — ED Notes (Signed)
Pt scrotum warm to touch, swollen and hardened on the left. PA made aware of finding. Pt also soiled his brief. Pt placed in new brief.

## 2020-11-30 NOTE — Consult Note (Signed)
Urology Consult  Referring physician: Dr. 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 He underwent left ureteroscopic stone extraction last week and had a stent placed at that time. Starting the day after the surgery he has urinary frequency every 15-20 minutes which were small volumes. He had urinary urgency, dysuria and nocturia. No incontinence. No gross hematuria. He noted left scrotal swelling Sunday. No fevers. 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 UA concerning for infection. Creatinine 3.84 and potassium 6.2 He was placed on potassium citrate after his ureteroscopy.   Past Medical History:  Diagnosis Date  . Acquired leg length discrepancy   . Bladder stone   . BPH associated with nocturia   . CAD (coronary artery disease) CARDIOLOGIST- DR HOCHREIN--  LAST VISIT 09-27-2011 IN EPIC   Non obstructive Cath 2010-  HX CORONARY SPASM 2004  . Cataract    bilateral   . GERD (gastroesophageal reflux disease)   . H/O hiatal hernia   . Heart murmur   . Heartburn   . High cholesterol   . History of nephrolithiasis 2009  . Hx of adenomatous colonic polyps   . Hyperlipidemia   . Hypertension   . Movement disorder   . Multiple sclerosis ([place]) DX  10/10---  NEUROLOGIST  DR Delfino Lovett [facility] ([place])   RIGHT SIDE AFFECTED MORE W/ WEAKNESS- - USES CANE  . Neuromuscular disorder ([facility])    MS  . NIDDM (non-insulin dependent diabetes mellitus)   . Peyronie's disease   . RBBB   . Renal stone RIGHT  . Rosacea   . Vision abnormalities    Past Surgical History:  Procedure Laterality Date  . CARDIAC CATHETERIZATION  02-26-2009 ---  DR Ida Rogue   NONOBSTRUCTIVE CAD/ 50% DISTAL RCA/ 30% LAD / NORMAL LVF  . CYSTOSCOPY W/ RETROGRADES   10/30/2011   Procedure: CYSTOSCOPY WITH RETROGRADE PYELOGRAM;  Surgeon: Claybon Jabs, MD;  Location: [facility];  Service: Urology;  Laterality: Right;  . CYSTOSCOPY WITH LITHOLAPAXY  10/30/2011   Procedure: CYSTOSCOPY WITH LITHOLAPAXY;  Surgeon: Claybon Jabs, MD;  Location: [facility];  Service: Urology;  Laterality: N/A;  . CYSTOSCOPY WITH RETROGRADE PYELOGRAM, URETEROSCOPY AND STENT PLACEMENT Left 11/25/2020   Procedure: CYSTOSCOPY WITH LEFT RETROGRADE PYELOGRAM, LEFT URETEROSCOPY WITH LASER AND LEFT URETERAL STENT PLACEMENT;  Surgeon: Cleon Gustin, MD;  Location: [facility];  Service: Urology;  Laterality: Left;  . EXTRACORPOREAL SHOCK WAVE LITHOTRIPSY  09-05-2010   RIGHT  . EYE SURGERY Right 08/30/2018  . HOLMIUM LASER APPLICATION Left 7/51/0258   Procedure: HOLMIUM LASER APPLICATION;  Surgeon: Cleon Gustin, MD;  Location: [facility];  Service: Urology;  Laterality: Left;  . NESBIT PROCEDURE  10-17-2004   CORRECTION OF PENILE ANGULATION (PEYRONIES DISEASE)    Medications: I have reviewed the patient's current medications. Allergies: No Known Allergies  Family History  Problem Relation Age of Onset  . Stroke Mother   . Heart attack Mother   . Diabetes Mother   . Hypertension Mother   . Renal cancer Father   . Diabetes Sister   . Heart attack Brother   . Diabetes Son   . Heart attack Maternal Grandfather   . Heart attack Brother   . Bladder  Cancer Brother    Social History:  reports that he quit smoking about 47 years ago. His smoking use included cigarettes. He has a 7.00 pack-year smoking history. His smokeless tobacco use includes chew. He reports that he does not drink alcohol and does not use drugs.  Review of Systems  Constitutional: Positive for chills and fatigue.  Genitourinary: Positive for difficulty urinating, dysuria, frequency, scrotal swelling, testicular pain and urgency.  All other systems reviewed and are  negative.   Physical Exam:  Vital signs in last 24 hours: Temp:  [98.8 F (37.1 C)] 98.8 F (37.1 C) (05/17 0901) Pulse Rate:  [82-88] 85 (05/17 1330) Resp:  [18-27] 20 (05/17 1330) BP: (128-155)/(66-91) 137/72 (05/17 1330) SpO2:  [85 %-95 %] 92 % (05/17 1536) Weight:  [72.6 kg] 72.6 kg (05/17 0858) Physical Exam Vitals reviewed.  Constitutional:      Appearance: Normal appearance.  HENT:     Head: Normocephalic and atraumatic.     Nose: Nose normal.     Mouth/Throat:     Mouth: Mucous membranes are moist.  Eyes:     Extraocular Movements: Extraocular movements intact.     Pupils: Pupils are equal, round, and reactive to light.  Cardiovascular:     Rate and Rhythm: Normal rate and regular rhythm.  Pulmonary:     Effort: Pulmonary effort is normal. No respiratory distress.  Abdominal:     General: Abdomen is flat. There is no distension.  Genitourinary:    Penis: Normal and circumcised.      Testes:        Left: Mass, tenderness and swelling present.     Epididymis:     Left: Inflamed and enlarged. Tenderness present.  Musculoskeletal:        General: No swelling. Normal range of motion.     Cervical back: Normal range of motion and neck supple.  Skin:    General: Skin is warm and dry.  Neurological:     General: No focal deficit present.     Mental Status: He is alert and oriented to person, place, and time.  Psychiatric:        Mood and Affect: Mood normal.        Behavior: Behavior normal.        Thought Content: Thought content normal.        Judgment: Judgment normal.     Laboratory Data:  Results for orders placed or performed during the hospital encounter of 11/30/20 (from the past 72 hour(s))  CBC with Differential     Status: Abnormal   Collection Time: 11/30/20  9:13 AM  Result Value Ref Range   WBC 12.8 (H) 4.0 - 10.5 K/uL   RBC 3.89 (L) 4.22 - 5.81 MIL/uL   Hemoglobin 11.0 (L) 13.0 - 17.0 g/dL   HCT 33.5 (L) 39.0 - 52.0 %   MCV 86.1 80.0 -  100.0 fL   MCH 28.3 26.0 - 34.0 pg   MCHC 32.8 30.0 - 36.0 g/dL   RDW 15.1 11.5 - 15.5 %   Platelets 233 150 - 400 K/uL   nRBC 0.0 0.0 - 0.2 %   Neutrophils Relative % 83 %   Neutro Abs 10.6 (H) 1.7 - 7.7 K/uL   Lymphocytes Relative 3 %   Lymphs Abs 0.4 (L) 0.7 - 4.0 K/uL   Monocytes Relative 12 %   Monocytes Absolute 1.5 (H) 0.1 - 1.0 K/uL   Eosinophils Relative 1 %   Eosinophils Absolute 0.2 0.0 -  0.5 K/uL   Basophils Relative 0 %   Basophils Absolute 0.0 0.0 - 0.1 K/uL   Immature Granulocytes 1 %   Abs Immature Granulocytes 0.09 (H) 0.00 - 0.07 K/uL    Comment: Performed at [facility], [address], [place], [place] [address]  Comprehensive metabolic panel     Status: Abnormal   Collection Time: 11/30/20  9:13 AM  Result Value Ref Range   Sodium 130 (L) 135 - 145 mmol/L   Potassium 6.2 (H) 3.5 - 5.1 mmol/L   Chloride 97 (L) 98 - 111 mmol/L   CO2 22 22 - 32 mmol/L   Glucose, Bld 215 (H) 70 - 99 mg/dL    Comment: Glucose reference range applies only to samples taken after fasting for at least 8 hours.   BUN 63 (H) 8 - 23 mg/dL   Creatinine, Ser 3.84 (H) 0.61 - 1.24 mg/dL   Calcium 9.1 8.9 - 10.3 mg/dL   Total Protein 7.2 6.5 - 8.1 g/dL   Albumin 3.2 (L) 3.5 - 5.0 g/dL   AST 23 15 - 41 U/L   ALT 10 0 - 44 U/L   Alkaline Phosphatase 80 38 - 126 U/L   Total Bilirubin 0.7 0.3 - 1.2 mg/dL   GFR, Estimated 16 (L) >60 mL/min    Comment: (NOTE) Calculated using the CKD-EPI Creatinine Equation (2021)    Anion gap 11 5 - 15    Comment: Performed at [facility], [address] [place], [place] [address]  Troponin I (High Sensitivity)     Status: None   Collection Time: 11/30/20  9:13 AM  Result Value Ref Range   Troponin I (High Sensitivity) 4 <18 ng/L    Comment: (NOTE) Elevated high sensitivity troponin I (hsTnI) values and significant  changes across serial measurements may suggest ACS but many other  chronic and acute conditions are known to elevate hsTnI results.   Refer to the "Links" section for chest pain algorithms and additional  guidance. Performed at [facility], [address] [place], [place] [address]   Urinalysis, Routine w reflex microscopic     Status: Abnormal   Collection Time: 11/30/20  9:14 AM  Result Value Ref Range   Color, Urine YELLOW YELLOW   APPearance CLEAR CLEAR   Specific Gravity, Urine 1.016 1.005 - 1.030   pH 6.0 5.0 - 8.0   Glucose, UA NEGATIVE NEGATIVE mg/dL   Hgb urine dipstick LARGE (A) NEGATIVE   Bilirubin Urine NEGATIVE NEGATIVE   Ketones, ur NEGATIVE NEGATIVE mg/dL   Protein, ur 30 (A) NEGATIVE mg/dL   Nitrite NEGATIVE NEGATIVE   Leukocytes,Ua MODERATE (A) NEGATIVE   RBC / HPF >50 (H) 0 - 5 RBC/hpf   WBC, UA 21-50 0 - 5 WBC/hpf   Bacteria, UA RARE (A) NONE SEEN   Squamous Epithelial / LPF 0-5 0 - 5   Uric Acid Crys, UA PRESENT    Sperm, UA PRESENT     Comment: Performed at [facility], [address], [place], [place] [address]  Troponin I (High Sensitivity)     Status: None   Collection Time: 11/30/20 11:07 AM  Result Value Ref Range   Troponin I (High Sensitivity) 5 <18 ng/L    Comment: (NOTE) Elevated high sensitivity troponin I (hsTnI) values and significant  changes across serial measurements may suggest ACS but many other  chronic and acute conditions are known to elevate hsTnI results.  Refer to the "Links" section for chest pain algorithms and  additional  guidance. Performed at [facility], [address], [place], [place] [address]    Recent Results (from the past 240 hour(s))  SARS CORONAVIRUS 2 (TAT 6-24 HRS) Nasopharyngeal Nasopharyngeal Swab     Status: None   Collection Time: 11/23/20 10:25 AM   Specimen: Nasopharyngeal Swab  Result Value Ref Range Status   SARS Coronavirus 2 NEGATIVE NEGATIVE Final    Comment: (NOTE) SARS-CoV-2 target nucleic acids are NOT DETECTED.  The SARS-CoV-2 RNA is generally detectable in upper and lower respiratory specimens during the acute phase of  infection. Negative results do not preclude SARS-CoV-2 infection, do not rule out co-infections with other pathogens, and should not be used as the sole basis for treatment or other patient management decisions. Negative results must be combined with clinical observations, patient history, and epidemiological information. The expected result is Negative.  Fact Sheet for Patients: SugarRoll.be  Fact Sheet for Healthcare Providers: https://www.woods-mathews.com/  This test is not yet approved or cleared by the [place] FDA and  has been authorized for detection and/or diagnosis of SARS-CoV-2 by FDA under an Emergency Use Authorization (EUA). This EUA will remain  in effect (meaning this test can be used) for the duration of the COVID-19 declaration under Se ction 564(b)(1) of the Act, 21 U.S.C. section 360bbb-3(b)(1), unless the authorization is terminated or revoked sooner.  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 I discussed the management of the urinoma with the patient and family. We will place a foley catheter at this time and we will monitor the creatinine. We will repeat CT in 48 hours to ensure the collection is decreasing. If the collection fails to improve the patient will need a drain placed.  2. Left epididymo-orchitis and UTI -Scrotal support -Continue rocephin 2g daily pending urine culture  Urology to continue to follow  Nicolette Bang 11/30/2020, 4:42 PM

## 2020-11-30 NOTE — H&P (Addendum)
History and Physical    SADRAC ZEOLI BEM:[id number] DOB: 05/25/1951 DOA: 11/30/2020  PCP: [name], FNP   Patient coming from: Home  I have personally briefly reviewed patient's old medical records in [place]  Chief Complaint: Weakness and confusion since L ureteroscopic stone extraction 5/12   HPI: TILFORD DEATON is a 70 y.o. male with medical history significant of HTN, HLD, MS, nonobstructive CAD, type II DM, GERD, Hypothyroidism,  L hiatal hernia, chronic R leg pain, BPH, Multiple Sclerosis and nephrolithiasis s/p left ureteroscopic stone extraction performed 11/25/2020 by Dr. Alyson Ingles who presents by his daughter after concerns of bizarre behavior. Since stent placement, pt has become increasingly confused, has had visual hallucinations, and is markedly more unsteady on his feet with generalized weakness. Hallucinations and confusion seem to wax and wane, with pt reporting dots and bugs on the wall. Family thought it might be related to his pain medicine, so they stopped giving it to him but visual hallucinations persisted. Pt's temperature increased to 61F at home so family decided to bring him to ED for evaluation.   ED Course: In ED, pt had brief episode of HTN to 155/91 and SpO2 drop to 95% which quickly resolved and vitals have been otherwise stable and pt is afebrile.  Patient CMP notable for AKI with creatinine elevated at 3.84 new hyperkalemia to 6.2.  CBC with mild neutrophilic leukocytosis to 07.1.  UA with large hematuria, 21-50 WBC but rare bacteria, with sperm and uric acid crystals present. EKG significant for peaked T-waves. UA with significant for large Hgb, proteinuria, moderate leukocytes, and rare bacteria. UCx pending. CXR demonstrated ill-space opacity c/w R lower lung pneumonia and atelectasis. CT abdomen/pelvis demonstrates ureteric stent is in place, but there is a large simple fluid attenuation collection within the retroperitoneum adjacent to the left  ureter and extending into patient's left inguinal hernia.  There is a small left-sided hydrocele, mild subcutaneous edema which may be reactive. On physical exam, pt in NAD but was found to have large, erythematous, scrotum which was extremely tender and hardened, moreso on the L than R, and Venous Doppler US revealed epididymitis. Dr. Doroteo Bradford was notified and recommended admission at [facility] and at some point be transferred to [facility] for nephrostomy tube placement by IR before returning to [facility] or remaining at [facility]. Pt was admitted for weakness, AKI, RLL Community-Acquired PNA, and Scrotal Epididymitis.   Review of Systems: Pt denies fevers, chills, sweating, SOB, chest pain, dysuria, constipation, or diarrhea.  All other systems reviewed and reported negative.     Past Medical History:  Diagnosis Date   Acquired leg length discrepancy    Bladder stone    BPH associated with nocturia    CAD (coronary artery disease) CARDIOLOGIST- DR HOCHREIN--  LAST VISIT 09-27-2011 IN EPIC   Non obstructive Cath 2010-  HX CORONARY SPASM 2004   Cataract    bilateral    GERD (gastroesophageal reflux disease)    H/O hiatal hernia    Heart murmur    Heartburn    High cholesterol    History of nephrolithiasis 2009   Hx of adenomatous colonic polyps    Hyperlipidemia    Hypertension    Movement disorder    Multiple sclerosis ([place]) DX  10/10---  NEUROLOGIST  DR Delfino Lovett [place] ([place])   RIGHT SIDE AFFECTED MORE W/ WEAKNESS- - USES CANE   Neuromuscular disorder (HCC)    MS   NIDDM (non-insulin dependent  diabetes mellitus)    Peyronie's disease    RBBB    Renal stone RIGHT   Rosacea    Vision abnormalities     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 / NORMAL LVF   CYSTOSCOPY W/ RETROGRADES  10/30/2011   Procedure: CYSTOSCOPY WITH RETROGRADE PYELOGRAM;  Surgeon:  Claybon Jabs, MD;  Location: [facility];  Service: Urology;  Laterality: Right;   CYSTOSCOPY WITH LITHOLAPAXY  10/30/2011   Procedure: CYSTOSCOPY WITH LITHOLAPAXY;  Surgeon: Claybon Jabs, MD;  Location: [facility];  Service: Urology;  Laterality: N/A;   CYSTOSCOPY WITH RETROGRADE PYELOGRAM, URETEROSCOPY AND STENT PLACEMENT Left 11/25/2020   Procedure: CYSTOSCOPY WITH LEFT RETROGRADE PYELOGRAM, LEFT URETEROSCOPY WITH LASER AND LEFT URETERAL STENT PLACEMENT;  Surgeon: Cleon Gustin, MD;  Location: [facility];  Service: Urology;  Laterality: Left;   EXTRACORPOREAL SHOCK WAVE LITHOTRIPSY  09-05-2010   RIGHT   EYE SURGERY Right 08/30/2018   HOLMIUM LASER APPLICATION Left 3/53/6144   Procedure: HOLMIUM LASER APPLICATION;  Surgeon: Cleon Gustin, MD;  Location: [facility];  Service: Urology;  Laterality: Left;   NESBIT PROCEDURE  10-17-2004   CORRECTION OF PENILE ANGULATION (PEYRONIES DISEASE)    Social History  reports that he quit smoking about 47 years ago. His smoking use included cigarettes. He has a 7.00 pack-year smoking history. His smokeless tobacco use includes chew. He reports that he does not drink alcohol and does not use drugs.  No Known Allergies  Family History  Problem Relation Age of Onset   Stroke Mother    Heart attack Mother    Diabetes Mother    Hypertension Mother    Renal cancer Father    Diabetes Sister    Heart attack Brother    Diabetes Son    Heart attack Maternal Grandfather    Heart attack Brother    Bladder Cancer Brother      Prior to Admission medications   Medication Sig Start Date End Date Taking? Authorizing Provider  alfuzosin (UROXATRAL) 10 MG 24 hr tablet Take 1 tablet (10 mg total) by mouth in the morning and at bedtime. 10/15/20  Yes McKenzie, Candee Furbish, MD  amLODipine (NORVASC) 2.5 MG tablet Take 1 tablet (2.5 mg total) by mouth daily. 07/28/20  Yes Hendricks Limes F, FNP  aspirin 81 MG EC tablet Take 81 mg by  mouth daily.   Yes [provider]  BETASERON 0.3 MG KIT injection Inject 0.25 mg into the skin every other day. Patient taking differently: Inject 0.3 mg into the skin every 7 (seven) days. 09/11/19  Yes Sater, Nanine Means, MD  cholecalciferol (VITAMIN D) 1000 UNITS tablet Take 1,000 Units by mouth daily.   Yes [provider]  cyanocobalamin (,VITAMIN B-12,) 1000 MCG/ML injection INJECT 1ML INTO THE SKIN ONCE MONTH Patient taking differently: Inject 1,000 mcg into the skin every 30 (thirty) days. 08/31/20  Yes Hendricks Limes F, FNP  doxycycline (VIBRAMYCIN) 100 MG capsule Take 1 capsule (100 mg total) by mouth daily. Patient taking differently: Take 100 mg by mouth every other day. 07/28/20  Yes Hendricks Limes F, FNP  fish oil-omega-3 fatty acids 1000 MG capsule Take 1 g by mouth at bedtime.   Yes [provider]  glucose blood test strip Use as instructed 07/06/14  Yes Chipper Herb, MD  isosorbide mononitrate (IMDUR) 60  MG 24 hr tablet Take 0.5 tablets (30 mg total) by mouth daily. 07/28/20  Yes Hendricks Limes F, FNP  levothyroxine (SYNTHROID) 50 MCG tablet Take 1 tablet (50 mcg total) by mouth daily before breakfast. 07/28/20  Yes Hendricks Limes F, FNP  lisinopril (ZESTRIL) 10 MG tablet Take 1 tablet (10 mg total) by mouth daily. 07/28/20  Yes Hendricks Limes F, FNP  metFORMIN (GLUCOPHAGE) 1000 MG tablet Take 1 tablet (1,000 mg total) by mouth 2 (two) times daily with a meal. 07/28/20  Yes [name], FNP  omeprazole (PRILOSEC) 20 MG capsule Take 1 capsule (20 mg total) by mouth daily. 07/28/20  Yes Hendricks Limes F, FNP  pioglitazone (ACTOS) 30 MG tablet Take 1 tablet (30 mg total) by mouth daily. 07/28/20  Yes Hendricks Limes F, FNP  Potassium Citrate 15 MEQ (1620 MG) TBCR Take 1 tablet by mouth in the morning and at bedtime. 11/25/20  Yes McKenzie, Candee Furbish, MD  rosuvastatin (CRESTOR) 10 MG tablet Take 1 tablet (10 mg total) by mouth at bedtime. 07/28/20  Yes Hendricks Limes F, FNP  SYRINGE-NEEDLE, DISP, 3 ML (B-D SYRINGE/NEEDLE 3CC/25GX5/8) 25G X 5/8" 3 ML MISC Use to give once a month B12 injections 12/28/17  Yes Chipper Herb, MD  tamsulosin (FLOMAX) 0.4 MG CAPS capsule Take 1 capsule (0.4 mg total) by mouth daily after supper. 07/28/20  Yes Hendricks Limes F, FNP  oxyCODONE-acetaminophen (PERCOCET) 5-325 MG tablet Take 1 tablet by mouth every 4 (four) hours as needed. Patient not taking: Reported on 11/30/2020 11/25/20 11/25/21  Cleon Gustin, MD  atorvastatin (LIPITOR) 10 MG tablet Take 10 mg by mouth daily.  09/27/11  [provider]  niacin-simvastatin (SIMCOR) 500-20 MG 24 hr tablet Take 1 tablet by mouth at bedtime.    09/27/11  [provider]    Physical Exam: Vitals:   11/30/20 1330 11/30/20 1535 11/30/20 1536 11/30/20 1700  BP: 137/72   (!) 142/59  Pulse: 85   82  Resp: 20   (!) 21  Temp:    99 F (37.2 C)  TempSrc:    Oral  SpO2: 94% (!) 85% 92% 96%  Weight:    74.8 kg  Height:    _0  (1.676 m)   Constitutional: NAD, calm, comfortable, ill-appearing Eyes: ids and conjunctivae normal ENMT: Mucous membranes are moist  Neck: normal, supple, no masses, no thyromegaly Respiratory: Increased work of breathing, clear to auscultation bilaterally, no wheezing, no crackles. Normal respiratory effort. No accessory muscle use.  Cardiovascular: Regular rate and rhythm, no murmurs / rubs / gallops. Abdomen: mild abdominal tenderness. no masses palpated. Bowel sounds positive.  Musculoskeletal: no clubbing / cyanosis. No joint deformity upper and lower extremities. Good ROM, no contractures. Normal muscle tone.  Skin: no rashes, lesions, ulcers. No induration Neurologic: GCS 15. CN 2-12 grossly intact. Sensation intact, DTR normal. Strength 5/5 in all 4.  Genitourinary: Scrotum is erythematous, distended, firm, and exquisitely tender. R>L in size.  Left testes with hard mass palpated warm, tender and erythematous Psychiatric:  Normal judgment and insight. Alert and oriented x 3. Normal mood.    Labs on Admission: I have personally reviewed following labs and imaging studies  CBC: Recent Labs  Lab 11/30/20 0913  WBC 12.8*  NEUTROABS 10.6*  HGB 11.0*  HCT 33.5*  MCV 86.1  PLT 233    Basic Metabolic Panel: Recent Labs  Lab 11/30/20 0913  NA 130*  K 6.2*  CL 97*  CO2 22  GLUCOSE 215*  BUN 63*  CREATININE 3.84*  CALCIUM 9.1    GFR: Estimated Creatinine Clearance: 16.2 mL/min (A) (by C-G formula based on SCr of 3.84 mg/dL (H)).  Liver Function Tests: Recent Labs  Lab 11/30/20 0913  AST 23  ALT 10  ALKPHOS 80  BILITOT 0.7  PROT 7.2  ALBUMIN 3.2*    Urine analysis:    Component Value Date/Time   COLORURINE YELLOW 11/30/2020 0914   APPEARANCEUR CLEAR 11/30/2020 0914   APPEARANCEUR Clear 10/15/2020 1127   LABSPEC 1.016 11/30/2020 0914   PHURINE 6.0 11/30/2020 0914   GLUCOSEU NEGATIVE 11/30/2020 0914   HGBUR LARGE (A) 11/30/2020 0914   BILIRUBINUR NEGATIVE 11/30/2020 0914   BILIRUBINUR Negative 10/15/2020 1127   [place] 11/30/2020 0914   PROTEINUR 30 (A) 11/30/2020 0914   UROBILINOGEN negative 12/18/2014 1601   UROBILINOGEN 1.0 04/06/2010 0509   NITRITE NEGATIVE 11/30/2020 0914   LEUKOCYTESUR MODERATE (A) 11/30/2020 0914    Radiological Exams on Admission: CT ABDOMEN PELVIS WO CONTRAST  Result Date: 11/30/2020 CLINICAL DATA:  Abdominal pain, fever. Left retrograde ureteral stent placement on 11/25/2020 EXAM: CT ABDOMEN AND PELVIS WITHOUT CONTRAST TECHNIQUE: Multidetector CT imaging of the abdomen and pelvis was performed following the standard protocol without IV contrast. COMPARISON:  CT 02/19/2014 FINDINGS: Lower chest: Dependent bibasilar consolidation and/or atelectasis. Heart size within normal limits. Coronary artery calcification. Hepatobiliary: Unremarkable unenhanced appearance of the liver. No focal liver lesion identified. Gallbladder within normal limits. No  hyperdense gallstone. No biliary dilatation. Pancreas: Unremarkable. No pancreatic ductal dilatation or surrounding inflammatory changes. Spleen: Normal in size without focal abnormality. Adrenals/Urinary Tract: Unremarkable adrenal glands. Left-sided double-J nephroureteral stent is in place and appears appropriately positioned. Mild left hydronephrosis. There are multiple small stones or stone fragments within the left kidney. Simple fluid attenuation collection within the retroperitoneum adjacent to the left ureter and extending into patient's left inguinal hernia. Collection measures approximately 18.0 x 4.3 x 6.3 cm (series 7, image 90; series 3, image 64). Mild left-sided perinephric stranding. Multiple punctate nonobstructing stones within the right kidney, largest measuring up to 5 mm. No right-sided hydronephrosis. Urinary bladder within normal limits. Stomach/Bowel: Small paraesophageal hernia versus distal esophageal diverticulum. Stomach within normal limits. No dilated loops of small bowel. Moderate volume of stool throughout the colon. No focal bowel wall thickening or inflammatory changes. A normal appendix is present in the right lower quadrant. Vascular/Lymphatic: Atherosclerotic calcifications throughout the aortoiliac axis. No aneurysm. No abdominopelvic lymphadenopathy. Reproductive: Prostate is unremarkable. Other: No pneumoperitoneum. Left inguinal hernia with retroperitoneal fluid tracking into the hernia sac. Small left-sided hydrocele. Mild subcutaneous edema surrounds the left hemiscrotum. No soft tissue gas. Musculoskeletal: Nonspecific soft tissue calcifications overlie the bilateral gluteal regions and lateral aspect of the proximal thighs. No acute osseous findings. Mild degenerative changes of the lumbar spine. IMPRESSION: 1. Left-sided double-J nephroureteral stent is in place and appears appropriately positioned. Mild left hydronephrosis. 2. Large simple fluid attenuation collection  within the retroperitoneum adjacent to the left ureter and extending into patient's left inguinal hernia. Collection measures approximately 18.0 x 4.3 x 6.3 cm. 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 3. Bilateral nephrolithiasis. 4. Dependent bibasilar consolidation and/or atelectasis. 5. Small left-sided hydrocele. Mild subcutaneous edema surrounding the left hemiscrotum, which may be reactive. Correlate with physical exam to exclude cellulitis. No soft tissue gas. Aortic Atherosclerosis (ICD10-I70.0). Electronically Signed   By: Davina Poke D.O.   On: 11/30/2020 13:01   DG Chest Portable 1 View  Result  Date: 11/30/2020 CLINICAL DATA:  Cough and shortness of breath EXAM: PORTABLE CHEST 1 VIEW COMPARISON:  March 05, 2018 FINDINGS: There is patchy airspace opacity with atelectasis in the right base. Lungs elsewhere clear. Heart size and pulmonary vascularity are normal. No adenopathy. No bone lesions. IMPRESSION: Ill-defined airspace opacity, likely representing combination of pneumonia and atelectasis right base. Lungs elsewhere clear. Cardiac silhouette within normal limits. Electronically Signed   By: Lowella Grip III M.D.   On: 11/30/2020 09:43   US SCROTUM W/DOPPLER  Result Date: 11/30/2020 CLINICAL DATA:  Left scrotal swelling. EXAM: SCROTAL ULTRASOUND DOPPLER ULTRASOUND OF THE TESTICLES TECHNIQUE: Complete ultrasound examination of the testicles, epididymis, and other scrotal structures was performed. Color and spectral Doppler ultrasound were also utilized to evaluate blood flow to the testicles. COMPARISON:  None. FINDINGS: Right testicle Measurements: 4.6 cm x 2.0 cm x 3.1 cm. No mass or microlithiasis visualized. Left testicle Measurements: 3.7 cm x 2.8 cm x 2.8 cm. No mass or microlithiasis visualized. Right epididymis: A 0.4 cm x 0.4 cm x 0.4 cm cyst is noted within the right epididymal head. Left epididymis: A 3.8 cm x 2.6 cm x 4.3 cm heterogeneous echogenic mass is seen  involving the left epididymis and adjacent portion of the scrotal wall. This demonstrates increased flow on color Doppler evaluation. Hydrocele: Small bilateral hydroceles are seen, left larger than right. Varicocele:  None visualized. Pulsed Doppler interrogation of both testes demonstrates normal low resistance arterial and venous waveforms bilaterally. IMPRESSION: 1. Findings consistent with marked severity left-sided epididymitis. Follow-up to resolution is recommended. 2. Small bilateral hydroceles. 3. Normal bilateral testicular flow. Electronically Signed   By: Virgina Norfolk M.D.   On: 11/30/2020 15:41    EKG: Independently reviewed. Peaked T-waves c/w early peaked T waves  Assessment/Plan Principal Problem:   AKI (acute kidney injury) ([place]) Active Problems:   Diabetes mellitus type 2 in nonobese (HCC)   Generalized weakness   Hypothyroidism   Gastroesophageal reflux disease   Nephrolithiasis   Epididymitis   Postoperative delirium   Hyperkalemia   Inguinal hernia   Hydronephrosis   Scrotum swelling   Hyperlipidemia   BPH with obstruction/lower urinary tract symptoms   Respiratory distress    AKI (prerenal versus obstructive) - Cr 3.84 on presentation 2 days postop left ureteral stent placement - S/p L urethral stent placement 5d before presentation to ED - UA demonstrated hematuria, hemglobinuria, moderate leukocytosis, proteinuria, and rare bacteria         - UCx pending - 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 Alyson Ingles), appreciate recs:         - Inserted Foley for urinoma         - Continue to monitor Cr         - Repeat CT in 48hr to ensure collection is decreasing in size               - If no reduction in size, pt may need to be transferred to [facility] for IR-guided Nephrostomy Tube placement          - Urology will continue to follow - Continue IVF maintenance fluids - Follow with AM BMPs  Bilateral nephrolithiasis  - left ureteral stent  placed, followed closely by nephrology, no hydronephrosis on right kidney, improving left hydronephrosis  Hyperkalemia - TREATED AND RESOLVED  - K 6.2 and EKG with peaked T-waves on presentation - Lokelma given in ED - Ca gluconate - Insulin and Dextrose -  Repeat BMP at 5pm with resolution of hyperkalemia   Epididymitis and Scrotal Swelling - Suspect this is due to E.Coli secondary to bladder obstruction from bladder dysfunction vs BPH - Scrotum edematous, with rubor and intense pain to palpation - Venous Doppler US of scrotum revealed epididymitis - UCx pending - Scrotal Support - Continue high dose IV ceftriaxone - blood culture x 2 to rule out bacteremia   Left Lower Lobe PNA - Neutrophilic leukocytosis on presentation - CXR on presentation demonstrated diffuse opacification and atelectasis of R base  - Follow w/ daily CBCs - Continue iv Azithromycin and ceftriaxone   Acute hypoxic Respiratory distress - Shallow breaths and SpO2 briefly dropped to 85% in ED - Continue to monitor SpO2  BPH w/ Urinary Retention - Home Flomax PRN - Home alfuzosin PRN - FOLEY CATH PLACED PER UROLOGIST - Monitor IOs  Post-Operative Delirium - Likely related to infectious etiology, such as UTI, PNA, or ureter stent as nidus for infection - Follow AMS as infection resolves - blood culture x 2 ordered to rule out bacteremia   Type 2 diabetes mellitus - RENAL SENSITIVE SSI COVERAGE WITH CBG TESTING 5X PER DAY - Follow up A1c to assess glycemic control   GERD - Continue omeprazole  Essential Hypertension - Currently normotensive - RESUMED HOME BP MEDICATIONS   Hypothyroidism - Continue Synthroid  Hyperlipidemia - Continue Omega 3 fatty acid - Continue statin  DVT prophylaxis: Lovenox Code Status:   NEED TO DETERMINE Family Communication:  Discussed at bedside with pt's daughter Mitsy 11/30/20 Disposition Plan:   Patient is from:  Home  Anticipated DC to:  Home  Anticipated DC  date:  UTA due to resolution of AKI  Anticipated DC barriers: Resolution of AKI, Resolution of epididymitis, Resolution of hyperkalemia  Consults called:  Urology (Dr. Alyson Ingles) Admission status:  Inpatient  Severity of Illness: The appropriate patient status for this patient is INPATIENT. Inpatient status is judged to be reasonable and necessary in order to provide the required intensity of service to ensure the patient's safety. The patient's presenting symptoms, physical exam findings, and initial radiographic and laboratory data in the context of their chronic comorbidities is felt to place them at high risk for further clinical deterioration. Furthermore, it is not anticipated that the patient will be medically stable for discharge from the hospital within 2 midnights of admission. The following factors support the patient status of inpatient.   " The patient's presenting symptoms include AMS, weakness. " The worrisome physical exam findings include abdominal tenderness and profound scrotal swelling. " The initial radiographic and laboratory data are worrisome because of elevated Cr and K, leukocytosis, abnormal UA, and focal consolidation c/w CAP, and CT demonstrating urinoma. " The chronic co-morbidities include s/p L urethral stent placement, BPH, and .  * I certify that at the point of admission it is my clinical judgment that the patient will require inpatient hospital care spanning beyond 2 midnights from the point of admission due to high intensity of service, high risk for further deterioration and high frequency of surveillance required.Modesta Messing MS4 [facility]  How to contact the [facility] Attending or Consulting provider [place] or covering provider during after hours [place], for this patient?   Check the care team in [facility] and look for a) attending/consulting TR[facility] provider listed and b) the [facility] team listed Log into [email or web address] and use [place]'s universal password to  access. If you do not have the password, please contact  the hospital operator. Locate the [facility] provider you are looking for under [facility] and page to a number that you can be directly reached. If you still have difficulty reaching the provider, please page the [facility] (Director on Call) for the Hospitalists listed on amion for assistance.  11/30/2020, 5:35 PM   ___________________________________________________________________________  ATTENDING NOTE   Patient seen and examined with Modesta Messing, Medical student. In addition to supervising the encounter, I played a key role in the decision making process as well as reviewed key findings.  I examined patient with student and noted large left testicular mass and ordered testicular US with findings of large epididymitis.  I have consulted to urologist Dr. Alyson Ingles and supervised order placement of medical student.  We have corrected the patient's critical hyperkalemia and repeated BMP shows K has corrected to 5.1.  Continue high dose cefriaxone for excellent urological penetration.  Please see detailed H&P notes above and orders.    Irwin Brakeman MD [facility] How to contact the [facility] Attending or Consulting provider [place] or covering provider during after hours [place], for this patient?  Check the care team in [facility] and look for a) attending/consulting TR[facility] provider listed and b) the [facility] team listed Log into [email or web address] and use [place]'s universal password to access. If you do not have the password, please contact the hospital operator. Locate the [facility] provider you are looking for under [facility] and page to a number that you can be directly reached. If you still have difficulty reaching the provider, please page the [facility] (Director on Call) for the Hospitalists listed on amion for assistance.

## 2020-12-01 ENCOUNTER — Other Ambulatory Visit: Payer: PPO | Admitting: Urology

## 2020-12-01 LAB — COMPREHENSIVE METABOLIC PANEL
ALT: 17 U/L (ref 0–44)
AST: 32 U/L (ref 15–41)
Albumin: 2.8 g/dL — ABNORMAL LOW (ref 3.5–5.0)
Alkaline Phosphatase: 92 U/L (ref 38–126)
Anion gap: 8 (ref 5–15)
BUN: 44 mg/dL — ABNORMAL HIGH (ref 8–23)
CO2: 25 mmol/L (ref 22–32)
Calcium: 9.2 mg/dL (ref 8.9–10.3)
Chloride: 103 mmol/L (ref 98–111)
Creatinine, Ser: 1.89 mg/dL — ABNORMAL HIGH (ref 0.61–1.24)
GFR, Estimated: 38 mL/min — ABNORMAL LOW (ref >60)
Glucose, Bld: 168 mg/dL — ABNORMAL HIGH (ref 70–99)
Potassium: 5.2 mmol/L — ABNORMAL HIGH (ref 3.5–5.1)
Sodium: 136 mmol/L (ref 135–145)
Total Bilirubin: 0.6 mg/dL (ref 0.3–1.2)
Total Protein: 6.6 g/dL (ref 6.5–8.1)

## 2020-12-01 LAB — CBC
HCT: 30.3 % — ABNORMAL LOW (ref 39.0–52.0)
Hemoglobin: 9.6 g/dL — ABNORMAL LOW (ref 13.0–17.0)
MCH: 28 pg (ref 26.0–34.0)
MCHC: 31.7 g/dL (ref 30.0–36.0)
MCV: 88.3 fL (ref 80.0–100.0)
Platelets: 206 10*3/uL (ref 150–400)
RBC: 3.43 MIL/uL — ABNORMAL LOW (ref 4.22–5.81)
RDW: 15.1 % (ref 11.5–15.5)
WBC: 8.4 10*3/uL (ref 4.0–10.5)
nRBC: 0 % (ref 0.0–0.2)

## 2020-12-01 LAB — GLUCOSE, CAPILLARY
Glucose-Capillary: 156 mg/dL — ABNORMAL HIGH (ref 70–99)
Glucose-Capillary: 138 mg/dL — ABNORMAL HIGH (ref 70–99)
Glucose-Capillary: 153 mg/dL — ABNORMAL HIGH (ref 70–99)
Glucose-Capillary: 88 mg/dL (ref 70–99)
Glucose-Capillary: 169 mg/dL — ABNORMAL HIGH (ref 70–99)

## 2020-12-01 LAB — MAGNESIUM: Magnesium: 2.1 mg/dL (ref 1.7–2.4)

## 2020-12-01 LAB — HIV ANTIBODY (ROUTINE TESTING W REFLEX): HIV Screen 4th Generation wRfx: NONREACTIVE

## 2020-12-01 LAB — SARS CORONAVIRUS 2 (TAT 6-24 HRS): SARS Coronavirus 2: NEGATIVE

## 2020-12-01 MED ORDER — LORAZEPAM 2 MG/ML IJ SOLN
2.0000 mg | Freq: Once | INTRAMUSCULAR | Status: AC
  Administered 2020-12-01: 2 mg via INTRAVENOUS
  Filled 2020-12-01: qty 1

## 2020-12-01 NOTE — Progress Notes (Signed)
Pt wife at bedside and reported the pt woke up and is trying to get out of bed multiple times. Pt stated he has to go do some work. While I was present pt did try to get up one time and reoriented once I placed his legs back in the bed.MD notified of current events as no PRN rest/agitation medications are ordered.

## 2020-12-01 NOTE — Progress Notes (Signed)
Patient confused at this time pulling at wires and foley. Patient almost pulled IV pole over onto bed. Redirection ineffective. Patient almost had safety mitts completely off. Reapplied. Notified Dr. Earnest Conroy. Orders received and implemented.

## 2020-12-01 NOTE — Plan of Care (Signed)

## 2020-12-01 NOTE — Plan of Care (Signed)
Patient care plan goals advancing.

## 2020-12-01 NOTE — Progress Notes (Signed)
PROGRESS NOTE    Jacob Fowler  ASN:[id number] DOB: Nov 22, 1950 DOA: 11/30/2020 PCP: [name], FNP   Brief Narrative:  DREAM NODAL is a 70 y.o. male with medical history significant of HTN, HLD, MS, nonobstructive CAD, type II DM, GERD, Hypothyroidism,  L hiatal hernia, chronic R leg pain, BPH, Multiple Sclerosis and nephrolithiasis s/p left ureteroscopic stone extraction performed 11/25/2020 by Dr. Alyson Ingles who presents by his daughter after concerns of bizarre behavior. Since stent placement, pt has become increasingly confused, has had visual hallucinations, and is markedly more unsteady on his feet with generalized weakness. Hallucinations and confusion seem to wax and wane, with pt reporting dots and bugs on the wall. Family thought it might be related to his pain medicine, so they stopped giving it to him but visual hallucinations persisted. Pt's temperature increased to 11F at home so family decided to bring him to ED for evaluation. In ED: CT abdomen/pelvis demonstrates ureteric stent is in place, but there is a large simple fluid attenuation collection within the retroperitoneum adjacent to the left ureter and extending into patient's left inguinal hernia. There is a small left-sided hydrocele, mild subcutaneous edema which may be reactive. Doppler US revealed epididymitis. Dr. Doroteo Bradford was notified and recommended admission at [facility] and possibly be transferred to [facility] for nephrostomy tube placement by IR if repeat imaging does not show ongoing improvement.  Assessment & Plan:   Principal Problem:   AKI (acute kidney injury) ([place]) Active Problems:   Diabetes mellitus type 2 in nonobese (HCC)   Generalized weakness   Hypothyroidism   Gastroesophageal reflux disease   Nephrolithiasis   Epididymitis   Postoperative delirium   Hyperkalemia   Inguinal hernia   Hydronephrosis   Scrotum swelling   Hyperlipidemia   BPH with obstruction/lower urinary tract  symptoms   Respiratory distress   Acute metabolic encephalopathy, POA Likely infectious process Post-Operative Delirium - Likely related to infectious etiology; UTI vs ureteral stent as nidus for infection given recent instrumentation and procedure - Follow AMS as infection resolves -Continue ceftriaxone - blood culture x 2 ordered to rule out bacteremia -prelim negative so far  Sepsis secondary to epididymitis and Scrotal Swelling, POA - Suspect infection due to bladder obstruction from bladder dysfunction vs BPH - Scrotum edematous, with rubor/calor and intense pain to light palpation - Venous Doppler US of scrotum revealed epididymitis - Cultures pending - Continue scrotal Support - Continue ceftriaxone(azithromycin as below)  Acute hypoxic Respiratory distress Left Lower Lobe PNA, sepsis POA - Tachycardic leukocytosis with multiple sources for infection - CXR on presentation demonstrated diffuse opacification and atelectasis of R base  - Leukocytosis resolving with IV fluid and antibiotics - Continue iv Azithromycin and ceftriaxone   AKI without history of CKD, resolving Likely obstructive - Cr 3.84 on presentation 2 days postop left ureteral stent placement - S/p L urethral stent placement 5 days prior to admission - 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 Alyson Ingles), appreciate recs:         - Continue foley cath         - Continue to monitor Cr         - Repeat CT in 48hr to ensure collection is decreasing in size               - If no reduction in size, pt may need to be transferred to [facility] for IR-guided-Nephrostomy Tube placement          -  Urology will continue to follow - Continue IVF maintenance fluids  Bilateral nephrolithiasis  - left ureteral stent placed, followed closely by nephrology, no hydronephrosis on right kidney, improving left hydronephrosis  Hyperkalemia -improving  - K 6.2 and EKG with peaked T-waves on presentation -  Lokelma/Ca gluconate/Insulin/Dextrose given in the ED -Potassium 5.2 this morning, follow repeat labs  BPH w/ Urinary Retention - Continue flomax, alfuzosin, and Foley per urology  Type 2 diabetes mellitus, controlled - A1c 6.9 -Continue sliding scale insulin as needed, hypoglycemic protocol Lab Results  Component Value Date   HGBA1C 6.9 (H) 11/23/2020   GERD - Continue omeprazole  Essential Hypertension -Continue home amlodipine, isosorbide, Crestor  Hypothyroidism - Continue Synthroid  Hyperlipidemia -Continue Crestor  DVT prophylaxis:  Lovenox Code Status: Full Family Communication: At bedside wife and mother  Status is: Inpatient  Dispo: The patient is from: Home              Anticipated d/c is to: To be determined              Anticipated d/c date is: 48 to 72 hours              Patient currently not medically stable for discharge  Consultants:   Urology  Procedures:   None planned  Antimicrobials:  Azithromycin, ceftriaxone as above  Subjective: No acute issues or events overnight, labs improving somewhat, mental status resolving but not yet back to baseline, denies nausea vomiting diarrhea constipation headache fevers or chills.  Objective: Vitals:   11/30/20 1536 11/30/20 1700 11/30/20 1949 12/01/20 0330  BP:  (!) 142/59 (!) 146/74 (!) 134/94  Pulse:  82 99 92  Resp:  (!) 21 20 19   Temp:  99 F (37.2 C) 98.6 F (37 C) 98.2 F (36.8 C)  TempSrc:  Oral Oral   SpO2: 92% 96% 93% 92%  Weight:  74.8 kg    Height:  5\' 6"  (1.676 m)      Intake/Output Summary (Last 24 hours) at 12/01/2020 0654 Last data filed at 12/01/2020 0300 Gross per 24 hour  Intake 728.69 ml  Output 450 ml  Net 278.69 ml   Filed Weights   11/30/20 0858 11/30/20 1700  Weight: 72.6 kg 74.8 kg    Examination:  General:  Pleasantly resting in bed, No acute distress.  Awake alert, oriented to person and general situation only HEENT:  Normocephalic atraumatic.   Sclerae nonicteric, noninjected.  Extraocular movements intact bilaterally. Neck:  Without mass or deformity.  Trachea is midline. Lungs:  Clear to auscultate bilaterally without rhonchi, wheeze, or rales. Heart:  Regular rate and rhythm.  Without murmurs, rubs, or gallops. Abdomen:  Soft, nontender, nondistended.  Without guarding or rebound.  Foley draining clear yellow urine Extremities: Without cyanosis, clubbing, edema, or obvious deformity. Vascular:  Dorsalis pedis and posterior tibial pulses palpable bilaterally.  Data Reviewed: I have personally reviewed following labs and imaging studies  CBC: Recent Labs  Lab 11/30/20 0913 11/30/20 1847  WBC 12.8* 10.9*  NEUTROABS 10.6*  --   HGB 11.0* 10.2*  HCT 33.5* 30.7*  MCV 86.1 86.7  PLT 233 462   Basic Metabolic Panel: Recent Labs  Lab 11/30/20 0913 11/30/20 1643 11/30/20 1847  NA 130* 132*  --   K 6.2* 5.1  --   CL 97* 98  --   CO2 22 24  --   GLUCOSE 215* 152*  --   BUN 63* 59*  --   CREATININE 3.84* 3.51*  3.31*  CALCIUM 9.1 9.1  --    GFR: Estimated Creatinine Clearance: 18.7 mL/min (A) (by C-G formula based on SCr of 3.31 mg/dL (H)). Liver Function Tests: Recent Labs  Lab 11/30/20 0913  AST 23  ALT 10  ALKPHOS 80  BILITOT 0.7  PROT 7.2  ALBUMIN 3.2*   No results for input(s): LIPASE, AMYLASE in the last 168 hours. No results for input(s): AMMONIA in the last 168 hours. Coagulation Profile: No results for input(s): INR, PROTIME in the last 168 hours. Cardiac Enzymes: No results for input(s): CKTOTAL, CKMB, CKMBINDEX, TROPONINI in the last 168 hours. BNP (last 3 results) No results for input(s): PROBNP in the last 8760 hours. HbA1C: No results for input(s): HGBA1C in the last 72 hours. CBG: Recent Labs  Lab 11/25/20 0925 11/25/20 1228 12/01/20 0257  GLUCAP 114* 110* 156*   Lipid Profile: No results for input(s): CHOL, HDL, LDLCALC, TRIG, CHOLHDL, LDLDIRECT in the last 72 hours. Thyroid  Function Tests: No results for input(s): TSH, T4TOTAL, FREET4, T3FREE, THYROIDAB in the last 72 hours. Anemia Panel: No results for input(s): VITAMINB12, FOLATE, FERRITIN, TIBC, IRON, RETICCTPCT in the last 72 hours. Sepsis Labs: No results for input(s): PROCALCITON, LATICACIDVEN in the last 168 hours.  Recent Results (from the past 240 hour(s))  SARS CORONAVIRUS 2 (TAT 6-24 HRS) Nasopharyngeal Nasopharyngeal Swab     Status: None   Collection Time: 11/23/20 10:25 AM   Specimen: Nasopharyngeal Swab  Result Value Ref Range Status   SARS Coronavirus 2 NEGATIVE NEGATIVE Final    Comment: (NOTE) SARS-CoV-2 target nucleic acids are NOT DETECTED.  The SARS-CoV-2 RNA is generally detectable in upper and lower respiratory specimens during the acute phase of infection. Negative results do not preclude SARS-CoV-2 infection, do not rule out co-infections with other pathogens, and should not be used as the sole basis for treatment or other patient management decisions. Negative results must be combined with clinical observations, patient history, and epidemiological information. The expected result is Negative.  Fact Sheet for Patients: SugarRoll.be  Fact Sheet for Healthcare Providers: https://www.woods-mathews.com/  This test is not yet approved or cleared by the [place] FDA and  has been authorized for detection and/or diagnosis of SARS-CoV-2 by FDA under an Emergency Use Authorization (EUA). This EUA will remain  in effect (meaning this test can be used) for the duration of the COVID-19 declaration under Se ction 564(b)(1) of the Act, 21 U.S.C. section 360bbb-3(b)(1), unless the authorization is terminated or revoked sooner.  Performed at [facility], [place] [address], [place], [place] [address]   SARS CORONAVIRUS 2 (TAT 6-24 HRS) Nasopharyngeal Nasopharyngeal Swab     Status: None   Collection Time: 11/30/20  1:55 PM   Specimen:  Nasopharyngeal Swab  Result Value Ref Range Status   SARS Coronavirus 2 NEGATIVE NEGATIVE Final    Comment: (NOTE) SARS-CoV-2 target nucleic acids are NOT DETECTED.  The SARS-CoV-2 RNA is generally detectable in upper and lower respiratory specimens during the acute phase of infection. Negative results do not preclude SARS-CoV-2 infection, do not rule out co-infections with other pathogens, and should not be used as the sole basis for treatment or other patient management decisions. Negative results must be combined with clinical observations, patient history, and epidemiological information. The expected result is Negative.  Fact Sheet for Patients: SugarRoll.be  Fact Sheet for Healthcare Providers: https://www.woods-mathews.com/  This test is not yet approved or cleared by the [place] FDA and  has been authorized for  detection and/or diagnosis of SARS-CoV-2 by FDA under an Emergency Use Authorization (EUA). This EUA will remain  in effect (meaning this test can be used) for the duration of the COVID-19 declaration under Se ction 564(b)(1) of the Act, 21 U.S.C. section 360bbb-3(b)(1), unless the authorization is terminated or revoked sooner.  Performed at [facility], [place] [address] [place], [place] [address]          Radiology Studies: CT ABDOMEN PELVIS WO CONTRAST  Result Date: 11/30/2020 CLINICAL DATA:  Abdominal pain, fever. Left retrograde ureteral stent placement on 11/25/2020 EXAM: CT ABDOMEN AND PELVIS WITHOUT CONTRAST TECHNIQUE: Multidetector CT imaging of the abdomen and pelvis was performed following the standard protocol without IV contrast. COMPARISON:  CT 02/19/2014 FINDINGS: Lower chest: Dependent bibasilar consolidation and/or atelectasis. Heart size within normal limits. Coronary artery calcification. Hepatobiliary: Unremarkable unenhanced appearance of the liver. No focal liver lesion identified.  Gallbladder within normal limits. No hyperdense gallstone. No biliary dilatation. Pancreas: Unremarkable. No pancreatic ductal dilatation or surrounding inflammatory changes. Spleen: Normal in size without focal abnormality. Adrenals/Urinary Tract: Unremarkable adrenal glands. Left-sided double-J nephroureteral stent is in place and appears appropriately positioned. Mild left hydronephrosis. There are multiple small stones or stone fragments within the left kidney. Simple fluid attenuation collection within the retroperitoneum adjacent to the left ureter and extending into patient's left inguinal hernia. Collection measures approximately 18.0 x 4.3 x 6.3 cm (series 7, image 90; series 3, image 64). Mild left-sided perinephric stranding. Multiple punctate nonobstructing stones within the right kidney, largest measuring up to 5 mm. No right-sided hydronephrosis. Urinary bladder within normal limits. Stomach/Bowel: Small paraesophageal hernia versus distal esophageal diverticulum. Stomach within normal limits. No dilated loops of small bowel. Moderate volume of stool throughout the colon. No focal bowel wall thickening or inflammatory changes. A normal appendix is present in the right lower quadrant. Vascular/Lymphatic: Atherosclerotic calcifications throughout the aortoiliac axis. No aneurysm. No abdominopelvic lymphadenopathy. Reproductive: Prostate is unremarkable. Other: No pneumoperitoneum. Left inguinal hernia with retroperitoneal fluid tracking into the hernia sac. Small left-sided hydrocele. Mild subcutaneous edema surrounds the left hemiscrotum. No soft tissue gas. Musculoskeletal: Nonspecific soft tissue calcifications overlie the bilateral gluteal regions and lateral aspect of the proximal thighs. No acute osseous findings. Mild degenerative changes of the lumbar spine. IMPRESSION: 1. Left-sided double-J nephroureteral stent is in place and appears appropriately positioned. Mild left hydronephrosis. 2. Large  simple fluid attenuation collection within the retroperitoneum adjacent to the left ureter and extending into patient's left inguinal hernia. Collection measures approximately 18.0 x 4.3 x 6.3 cm. 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 3. Bilateral nephrolithiasis. 4. Dependent bibasilar consolidation and/or atelectasis. 5. Small left-sided hydrocele. Mild subcutaneous edema surrounding the left hemiscrotum, which may be reactive. Correlate with physical exam to exclude cellulitis. No soft tissue gas. Aortic Atherosclerosis (ICD10-I70.0). Electronically Signed   By: Davina Poke D.O.   On: 11/30/2020 13:01   DG Chest Portable 1 View  Result Date: 11/30/2020 CLINICAL DATA:  Cough and shortness of breath EXAM: PORTABLE CHEST 1 VIEW COMPARISON:  March 05, 2018 FINDINGS: There is patchy airspace opacity with atelectasis in the right base. Lungs elsewhere clear. Heart size and pulmonary vascularity are normal. No adenopathy. No bone lesions. IMPRESSION: Ill-defined airspace opacity, likely representing combination of pneumonia and atelectasis right base. Lungs elsewhere clear. Cardiac silhouette within normal limits. Electronically Signed   By: Lowella Grip III M.D.   On: 11/30/2020 09:43   US SCROTUM W/DOPPLER  Result Date: 11/30/2020 CLINICAL  DATA:  Left scrotal swelling. EXAM: SCROTAL ULTRASOUND DOPPLER ULTRASOUND OF THE TESTICLES TECHNIQUE: Complete ultrasound examination of the testicles, epididymis, and other scrotal structures was performed. Color and spectral Doppler ultrasound were also utilized to evaluate blood flow to the testicles. COMPARISON:  None. FINDINGS: Right testicle Measurements: 4.6 cm x 2.0 cm x 3.1 cm. No mass or microlithiasis visualized. Left testicle Measurements: 3.7 cm x 2.8 cm x 2.8 cm. No mass or microlithiasis visualized. Right epididymis: A 0.4 cm x 0.4 cm x 0.4 cm cyst is noted within the right epididymal head. Left epididymis: A 3.8 cm x 2.6 cm x 4.3 cm  heterogeneous echogenic mass is seen involving the left epididymis and adjacent portion of the scrotal wall. This demonstrates increased flow on color Doppler evaluation. Hydrocele: Small bilateral hydroceles are seen, left larger than right. Varicocele:  None visualized. Pulsed Doppler interrogation of both testes demonstrates normal low resistance arterial and venous waveforms bilaterally. IMPRESSION: 1. Findings consistent with marked severity left-sided epididymitis. Follow-up to resolution is recommended. 2. Small bilateral hydroceles. 3. Normal bilateral testicular flow. Electronically Signed   By: Virgina Norfolk M.D.   On: 11/30/2020 15:41        Scheduled Meds: . alfuzosin  10 mg Oral Q breakfast  . amLODipine  2.5 mg Oral Daily  . aspirin EC  81 mg Oral Daily  . Chlorhexidine Gluconate Cloth  6 each Topical Daily  . heparin  5,000 Units Subcutaneous Q8H  . insulin aspart  0-9 Units Subcutaneous TID WC  . isosorbide mononitrate  30 mg Oral Daily  . levothyroxine  50 mcg Oral QAC breakfast  . omega-3 acid ethyl esters  1 g Oral QHS  . pantoprazole  40 mg Oral Daily  . rosuvastatin  5 mg Oral QHS  . tamsulosin  0.4 mg Oral QPC supper   Continuous Infusions: . sodium chloride 75 mL/hr at 11/30/20 1713  . azithromycin    . cefTRIAXone (ROCEPHIN)  IV       LOS: 1 day   Time spent: 33min  Timara Loma C Mihran Lebarron, DO [facility]  If 7PM-7AM, please contact night-coverage [email or web address]  12/01/2020, 6:54 AM

## 2020-12-01 NOTE — Progress Notes (Signed)
Patient more oriented this morning. Drowsy, intermittent confusion, seems somewhat aware of his own confusion. Family is at bedside. Wife encouraged to stay tonight, at this time states she will. He appears much more calm with them here. Tolerated meds well with sips of water. No complaints of pain, scrotum tender during repositioning.

## 2020-12-01 NOTE — Progress Notes (Signed)
MD notified of pt confusion and hallucinations. Pt seeing objects in the room that are not present. Pt has not attempted to climb out of the bed since last assessment. Will continue to monitor

## 2020-12-01 NOTE — Progress Notes (Signed)
Subjective: Patient reports less abdominal and less left flank pain. Patient confusion overnight. creatinine improved to 1.9. cultures pending  Objective: Vital signs in last 24 hours: Temp:  [98.2 F (36.8 C)-99 F (37.2 C)] 98.3 F (36.8 C) (05/18 1310) Pulse Rate:  [79-99] 79 (05/18 1310) Resp:  [19-21] 20 (05/18 1310) BP: (116-146)/(59-94) 116/61 (05/18 1310) SpO2:  [92 %-97 %] 97 % (05/18 1310) Weight:  [74.8 kg] 74.8 kg (05/17 1700)  Intake/Output from previous day: 05/17 0701 - 05/18 0700 In: 728.7 [I.V.:628.7; IV Piggyback:100] Out: 450 [Urine:450] Intake/Output this shift: Total I/O In: 2154.7 [P.O.:1287; I.V.:867.7] Out: 1100 [Urine:1100]  Physical Exam:  General:alert, cooperative and appears stated age GI: soft, non tender, normal bowel sounds, no palpable masses, no organomegaly, no inguinal hernia Male genitalia: not done Extremities: extremities normal, atraumatic, no cyanosis or edema  Lab Results: Recent Labs    11/30/20 0913 11/30/20 1847 12/01/20 0635  HGB 11.0* 10.2* 9.6*  HCT 33.5* 30.7* 30.3*   BMET Recent Labs    11/30/20 1643 11/30/20 1847 12/01/20 0635  NA 132*  --  136  K 5.1  --  5.2*  CL 98  --  103  CO2 24  --  25  GLUCOSE 152*  --  168*  BUN 59*  --  44*  CREATININE 3.51* 3.31* 1.89*  CALCIUM 9.1  --  9.2   No results for input(s): LABPT, INR in the last 72 hours. No results for input(s): LABURIN in the last 72 hours. Results for orders placed or performed during the hospital encounter of 11/30/20  Urine culture     Status: Abnormal (Preliminary result)   Collection Time: 11/30/20  9:14 AM   Specimen: Urine, Random  Result Value Ref Range Status   Specimen Description   Final    URINE, RANDOM Performed at [facility], [address] [place], [place] [address]    Special Requests   Final    NONE Performed at [facility], [address], [place], [place] [address]    Culture (A)  Final    20,000 COLONIES/mL  ENTEROBACTER AEROGENES SUSCEPTIBILITIES TO FOLLOW Performed at [facility], [place] [address], [place], [place] [address]    Report Status PENDING  Incomplete  SARS CORONAVIRUS 2 (TAT 6-24 HRS) Nasopharyngeal Nasopharyngeal Swab     Status: None   Collection Time: 11/30/20  1:55 PM   Specimen: Nasopharyngeal Swab  Result Value Ref Range Status   SARS Coronavirus 2 NEGATIVE NEGATIVE Final    Comment: (NOTE) SARS-CoV-2 target nucleic acids are NOT DETECTED.  The SARS-CoV-2 RNA is generally detectable in upper and lower respiratory specimens during the acute phase of infection. Negative results do not preclude SARS-CoV-2 infection, do not rule out co-infections with other pathogens, and should not be used as the sole basis for treatment or other patient management decisions. Negative results must be combined with clinical observations, patient history, and epidemiological information. The expected result is Negative.  Fact Sheet for Patients: SugarRoll.be  Fact Sheet for Healthcare Providers: https://www.woods-mathews.com/  This test is not yet approved or cleared by the [place] FDA and  has been authorized for detection and/or diagnosis of SARS-CoV-2 by FDA under an Emergency Use Authorization (EUA). This EUA will remain  in effect (meaning this test can be used) for the duration of the COVID-19 declaration under Se ction 564(b)(1) of the Act, 21 U.S.C. section 360bbb-3(b)(1), unless the authorization is terminated or revoked sooner.  Performed at [facility], [address], [place], [place] [address]   Blood culture (routine x 2)     Status: None (Preliminary result)   Collection Time: 11/30/20  2:26 PM   Specimen: BLOOD  Result Value Ref Range Status   Specimen Description BLOOD  Final   Special Requests NONE  Final   Culture   Final    NO GROWTH < 24 HOURS Performed at [facility], [address],  [place], [place] [address]    Report Status PENDING  Incomplete  Blood culture (routine x 2)     Status: None (Preliminary result)   Collection Time: 11/30/20  2:26 PM   Specimen: BLOOD  Result Value Ref Range Status   Specimen Description BLOOD  Final   Special Requests NONE  Final   Culture   Final    NO GROWTH < 24 HOURS Performed at [facility], [address] [place], [place] [address]    Report Status PENDING  Incomplete    Studies/Results: CT ABDOMEN PELVIS WO CONTRAST  Result Date: 11/30/2020 CLINICAL DATA:  Abdominal pain, fever. Left retrograde ureteral stent placement on 11/25/2020 EXAM: CT ABDOMEN AND PELVIS WITHOUT CONTRAST TECHNIQUE: Multidetector CT imaging of the abdomen and pelvis was performed following the standard protocol without IV contrast. COMPARISON:  CT 02/19/2014 FINDINGS: Lower chest: Dependent bibasilar consolidation and/or atelectasis. Heart size within normal limits. Coronary artery calcification. Hepatobiliary: Unremarkable unenhanced appearance of the liver. No focal liver lesion identified. Gallbladder within normal limits. No hyperdense gallstone. No biliary dilatation. Pancreas: Unremarkable. No pancreatic ductal dilatation or surrounding inflammatory changes. Spleen: Normal in size without focal abnormality. Adrenals/Urinary Tract: Unremarkable adrenal glands. Left-sided double-J nephroureteral stent is in place and appears appropriately positioned. Mild left hydronephrosis. There are multiple small stones or stone fragments within the left kidney. Simple fluid attenuation collection within the retroperitoneum adjacent to the left ureter and extending into patient's left inguinal hernia. Collection measures approximately 18.0 x 4.3 x 6.3 cm (series 7, image 90; series 3, image 64). Mild left-sided perinephric stranding. Multiple punctate nonobstructing stones within the right kidney, largest measuring up to 5 mm. No right-sided hydronephrosis. Urinary bladder within  normal limits. Stomach/Bowel: Small paraesophageal hernia versus distal esophageal diverticulum. Stomach within normal limits. No dilated loops of small bowel. Moderate volume of stool throughout the colon. No focal bowel wall thickening or inflammatory changes. A normal appendix is present in the right lower quadrant. Vascular/Lymphatic: Atherosclerotic calcifications throughout the aortoiliac axis. No aneurysm. No abdominopelvic lymphadenopathy. Reproductive: Prostate is unremarkable. Other: No pneumoperitoneum. Left inguinal hernia with retroperitoneal fluid tracking into the hernia sac. Small left-sided hydrocele. Mild subcutaneous edema surrounds the left hemiscrotum. No soft tissue gas. Musculoskeletal: Nonspecific soft tissue calcifications overlie the bilateral gluteal regions and lateral aspect of the proximal thighs. No acute osseous findings. Mild degenerative changes of the lumbar spine. IMPRESSION: 1. Left-sided double-J nephroureteral stent is in place and appears appropriately positioned. Mild left hydronephrosis. 2. Large simple fluid attenuation collection within the retroperitoneum adjacent to the left ureter and extending into patient's left inguinal hernia. Collection measures approximately 18.0 x 4.3 x 6.3 cm. 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 3. Bilateral nephrolithiasis. 4. Dependent bibasilar consolidation and/or atelectasis. 5. Small left-sided hydrocele. Mild subcutaneous edema surrounding the left hemiscrotum, which may be reactive. Correlate with physical exam to exclude cellulitis. No soft tissue gas. Aortic Atherosclerosis (ICD10-I70.0). Electronically Signed   By: Davina Poke D.O.   On: 11/30/2020 13:01   DG Chest Portable 1 View  Result Date: 11/30/2020 CLINICAL DATA:  Cough and shortness of breath EXAM: PORTABLE CHEST 1 VIEW COMPARISON:  March 05, 2018 FINDINGS: There is patchy airspace opacity with atelectasis in the right base. Lungs elsewhere clear. Heart size  and pulmonary vascularity are normal. No adenopathy. No bone lesions. IMPRESSION: Ill-defined airspace opacity, likely representing combination of pneumonia and atelectasis right base. Lungs elsewhere clear. Cardiac silhouette within normal limits. Electronically Signed   By: Lowella Grip III M.D.   On: 11/30/2020 09:43   US SCROTUM W/DOPPLER  Result Date: 11/30/2020 CLINICAL DATA:  Left scrotal swelling. EXAM: SCROTAL ULTRASOUND DOPPLER ULTRASOUND OF THE TESTICLES TECHNIQUE: Complete ultrasound examination of the testicles, epididymis, and other scrotal structures was performed. Color and spectral Doppler ultrasound were also utilized to evaluate blood flow to the testicles. COMPARISON:  None. FINDINGS: Right testicle Measurements: 4.6 cm x 2.0 cm x 3.1 cm. No mass or microlithiasis visualized. Left testicle Measurements: 3.7 cm x 2.8 cm x 2.8 cm. No mass or microlithiasis visualized. Right epididymis: A 0.4 cm x 0.4 cm x 0.4 cm cyst is noted within the right epididymal head. Left epididymis: A 3.8 cm x 2.6 cm x 4.3 cm heterogeneous echogenic mass is seen involving the left epididymis and adjacent portion of the scrotal wall. This demonstrates increased flow on color Doppler evaluation. Hydrocele: Small bilateral hydroceles are seen, left larger than right. Varicocele:  None visualized. Pulsed Doppler interrogation of both testes demonstrates normal low resistance arterial and venous waveforms bilaterally. IMPRESSION: 1. Findings consistent with marked severity left-sided epididymitis. Follow-up to resolution is recommended. 2. Small bilateral hydroceles. 3. Normal bilateral testicular flow. Electronically Signed   By: Virgina Norfolk M.D.   On: 11/30/2020 15:41    Assessment/Plan: 69yo with urinoma and left epididymo-orchitis  1. Continue foley catheter to straight drain. We will schedule for CT stone study Friday morning to ensure improvement/resolution of the urinoma 2. Epididymo-orchitis:  continue rocephin pending urine culture   LOS: 1 day   Nicolette Bang 12/01/2020, 4:05 PM

## 2020-12-02 LAB — CBC
HCT: 30.9 % — ABNORMAL LOW (ref 39.0–52.0)
Hemoglobin: 10.2 g/dL — ABNORMAL LOW (ref 13.0–17.0)
MCH: 28.8 pg (ref 26.0–34.0)
MCHC: 33 g/dL (ref 30.0–36.0)
MCV: 87.3 fL (ref 80.0–100.0)
Platelets: 241 10*3/uL (ref 150–400)
RBC: 3.54 MIL/uL — ABNORMAL LOW (ref 4.22–5.81)
RDW: 14.8 % (ref 11.5–15.5)
WBC: 8.6 10*3/uL (ref 4.0–10.5)
nRBC: 0 % (ref 0.0–0.2)

## 2020-12-02 LAB — BASIC METABOLIC PANEL
Anion gap: 8 (ref 5–15)
BUN: 26 mg/dL — ABNORMAL HIGH (ref 8–23)
CO2: 22 mmol/L (ref 22–32)
Calcium: 8.8 mg/dL — ABNORMAL LOW (ref 8.9–10.3)
Chloride: 104 mmol/L (ref 98–111)
Creatinine, Ser: 1.13 mg/dL (ref 0.61–1.24)
GFR, Estimated: >60 mL/min (ref >60)
Glucose, Bld: 176 mg/dL — ABNORMAL HIGH (ref 70–99)
Potassium: 4.3 mmol/L (ref 3.5–5.1)
Sodium: 134 mmol/L — ABNORMAL LOW (ref 135–145)

## 2020-12-02 LAB — URINE CULTURE: Culture: 20000 — AB

## 2020-12-02 LAB — GLUCOSE, CAPILLARY
Glucose-Capillary: 169 mg/dL — ABNORMAL HIGH (ref 70–99)
Glucose-Capillary: 158 mg/dL — ABNORMAL HIGH (ref 70–99)
Glucose-Capillary: 197 mg/dL — ABNORMAL HIGH (ref 70–99)
Glucose-Capillary: 179 mg/dL — ABNORMAL HIGH (ref 70–99)
Glucose-Capillary: 178 mg/dL — ABNORMAL HIGH (ref 70–99)

## 2020-12-02 MED ORDER — SULFAMETHOXAZOLE-TRIMETHOPRIM 800-160 MG PO TABS
1.0000 | ORAL_TABLET | Freq: Two times a day (BID) | ORAL | Status: DC
  Administered 2020-12-02 – 2020-12-03 (×3): 1 via ORAL
  Filled 2020-12-02 (×2): qty 1

## 2020-12-02 NOTE — Progress Notes (Signed)
PROGRESS NOTE    Jacob Fowler  [id number] DOB: 1950-07-30 DOA: 11/30/2020 PCP: [name], FNP   Brief Narrative:  Jacob Fowler is a 70 y.o. male with medical history significant of HTN, HLD, MS, nonobstructive CAD, type II DM, GERD, Hypothyroidism,  L hiatal hernia, chronic R leg pain, BPH, Multiple Sclerosis and nephrolithiasis s/p left ureteroscopic stone extraction performed 11/25/2020 by Dr. Alyson Ingles who presents by his daughter after concerns of bizarre behavior. Since stent placement, pt has become increasingly confused, has had visual hallucinations, and is markedly more unsteady on his feet with generalized weakness. Hallucinations and confusion seem to wax and wane, with pt reporting dots and bugs on the wall. Family thought it might be related to his pain medicine, so they stopped giving it to him but visual hallucinations persisted. Pt's temperature increased to 104F at home so family decided to bring him to ED for evaluation. In ED: CT abdomen/pelvis demonstrates ureteric stent is in place, but there is a large simple fluid attenuation collection within the retroperitoneum adjacent to the left ureter and extending into patient's left inguinal hernia. There is a small left-sided hydrocele, mild subcutaneous edema which may be reactive. Doppler US revealed epididymitis. Dr. Doroteo Bradford was notified and recommended admission at [facility] and possibly be transferred to [facility] for nephrostomy tube placement by IR if repeat imaging does not show ongoing improvement.  Assessment & Plan:   Principal Problem:   AKI (acute kidney injury) ([facility]) Active Problems:   Diabetes mellitus type 2 in nonobese (HCC)   Generalized weakness   Hypothyroidism   Gastroesophageal reflux disease   Nephrolithiasis   Epididymitis   Postoperative delirium   Hyperkalemia   Inguinal hernia   Hydronephrosis   Scrotum swelling   Hyperlipidemia   BPH with obstruction/lower urinary tract  symptoms   Respiratory distress   Acute metabolic encephalopathy, POA, resolving Likely infectious process Post-Operative Delirium - Likely related to infectious etiology; UTI vs ureteral stent as nidus for infection given recent instrumentation and procedure - Follow AMS as infection resolves - Continue ceftriaxone - blood culture x 2 ordered to rule out bacteremia -prelim negative so far  Sepsis secondary to epididymitis and Scrotal Swelling, POA - Suspect infection due to bladder obstruction from bladder dysfunction vs BPH - Scrotum edematous, with rubor/calor and intense pain to light palpation - Venous Doppler US of scrotum revealed epididymitis - Cultures pending - Continue scrotal Support - Continue ceftriaxone(azithromycin as below)  Acute hypoxic Respiratory distress Left Lower Lobe PNA, sepsis POA - Tachycardic leukocytosis with multiple sources for infection - CXR on presentation demonstrated diffuse opacification and atelectasis of R base  - Leukocytosis resolving with IV fluid and antibiotics - Continue iv Azithromycin and ceftriaxone   AKI without history of CKD, resolved Likely obstructive - Cr 3.84 on presentation 2 days postop left ureteral stent placement - S/p L urethral stent placement 5 days prior to admission - 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 Alyson Ingles), appreciate recs:         - Continue foley cath         - Continue to monitor Cr         - Repeat CT in 48hr to ensure collection is decreasing in size               - If no reduction in size, pt may need to be transferred to [facility] for IR-guided-Nephrostomy Tube placement          -  Urology will continue to follow - Continue IVF maintenance fluids - will wean down as PO intake increases appropriately  Bilateral nephrolithiasis  - left ureteral stent placed, followed closely by nephrology, no hydronephrosis on right kidney, improving left hydronephrosis  Hyperkalemia - resolved   - K 6.2 and EKG with peaked T-waves on presentation - Lokelma/Ca gluconate/Insulin/Dextrose given in the ED -Potassium WNL this morning, follow repeat labs  BPH w/ Urinary Retention - Continue flomax, alfuzosin, and Foley per urology  Type 2 diabetes mellitus, controlled - A1c 6.9 -Continue sliding scale insulin as needed, hypoglycemic protocol   GERD - Continue omeprazole  Essential Hypertension -Continue home amlodipine, isosorbide, Crestor  Hypothyroidism - Continue Synthroid  Hyperlipidemia -Continue Crestor  DVT prophylaxis:  Lovenox Code Status: Full Family Communication: At bedside wife and mother  Status is: Inpatient  Dispo: The patient is from: Home              Anticipated d/c is to: To be determined              Anticipated d/c date is: 48 to 72 hours              Patient currently not medically stable for discharge  Consultants:   Urology  Procedures:   None planned  Antimicrobials:  Azithromycin, ceftriaxone as above  Subjective: Hallucinations overnight per sign out - labs improving somewhat, mental status resolving this morning but not yet back to baseline, denies nausea vomiting diarrhea constipation headache fevers or chills.  Objective: Vitals:   12/01/20 0330 12/01/20 1310 12/01/20 2143 12/02/20 0600  BP: (!) 134/94 116/61 (!) 152/63 (!) 141/71  Pulse: 92 79 83 81  Resp: 19 20 20 16   Temp: 98.2 F (36.8 C) 98.3 F (36.8 C) 98.4 F (36.9 C) 98.1 F (36.7 C)  TempSrc:  Oral Oral Oral  SpO2: 92% 97% 96% 95%  Weight:      Height:        Intake/Output Summary (Last 24 hours) at 12/02/2020 0704 Last data filed at 12/02/2020 0300 Gross per 24 hour  Intake 3388.54 ml  Output 2050 ml  Net 1338.54 ml   Filed Weights   11/30/20 0858 11/30/20 1700  Weight: 72.6 kg 74.8 kg    Examination:  General:  Pleasantly resting in bed, No acute distress.  Awake alert, oriented to person and time/month/date only HEENT:  Normocephalic  atraumatic.  Sclerae nonicteric, noninjected.  Extraocular movements intact bilaterally. Neck:  Without mass or deformity.  Trachea is midline. Lungs:  Clear to auscultate bilaterally without rhonchi, wheeze, or rales. Heart:  Regular rate and rhythm.  Without murmurs, rubs, or gallops. Abdomen:  Soft, nontender, nondistended.  Without guarding or rebound.  Foley draining clear yellow urine Extremities: Without cyanosis, clubbing, edema, or obvious deformity. Vascular:  Dorsalis pedis and posterior tibial pulses palpable bilaterally.  Data Reviewed: I have personally reviewed following labs and imaging studies  CBC: Recent Labs  Lab 11/30/20 0913 11/30/20 1847 12/01/20 0635  WBC 12.8* 10.9* 8.4  NEUTROABS 10.6*  --   --   HGB 11.0* 10.2* 9.6*  HCT 33.5* 30.7* 30.3*  MCV 86.1 86.7 88.3  PLT 233 204 [id number]   Basic Metabolic Panel: Recent Labs  Lab 11/30/20 0913 11/30/20 1643 11/30/20 1847 12/01/20 0635  NA 130* 132*  --  136  K 6.2* 5.1  --  5.2*  CL 97* 98  --  103  CO2 22 24  --  25  GLUCOSE 215* 152*  --  168*  BUN 63* 59*  --  44*  CREATININE 3.84* 3.51* 3.31* 1.89*  CALCIUM 9.1 9.1  --  9.2  MG  --   --   --  2.1   GFR: Estimated Creatinine Clearance: 32.8 mL/min (A) (by C-G formula based on SCr of 1.89 mg/dL (H)). Liver Function Tests: Recent Labs  Lab 11/30/20 0913 12/01/20 0635  AST 23 32  ALT 10 17  ALKPHOS 80 92  BILITOT 0.7 0.6  PROT 7.2 6.6  ALBUMIN 3.2* 2.8*   No results for input(s): LIPASE, AMYLASE in the last 168 hours. No results for input(s): AMMONIA in the last 168 hours. Coagulation Profile: No results for input(s): INR, PROTIME in the last 168 hours. Cardiac Enzymes: No results for input(s): CKTOTAL, CKMB, CKMBINDEX, TROPONINI in the last 168 hours. BNP (last 3 results) No results for input(s): PROBNP in the last 8760 hours. HbA1C: No results for input(s): HGBA1C in the last 72 hours. CBG: Recent Labs  Lab 12/01/20 0751 12/01/20 1118  12/01/20 1623 12/01/20 2140 12/02/20 0304  GLUCAP 138* 153* 88 169* 169*   Lipid Profile: No results for input(s): CHOL, HDL, LDLCALC, TRIG, CHOLHDL, LDLDIRECT in the last 72 hours. Thyroid Function Tests: No results for input(s): TSH, T4TOTAL, FREET4, T3FREE, THYROIDAB in the last 72 hours. Anemia Panel: No results for input(s): VITAMINB12, FOLATE, FERRITIN, TIBC, IRON, RETICCTPCT in the last 72 hours. Sepsis Labs: No results for input(s): PROCALCITON, LATICACIDVEN in the last 168 hours.  Recent Results (from the past 240 hour(s))  SARS CORONAVIRUS 2 (TAT 6-24 HRS) Nasopharyngeal Nasopharyngeal Swab     Status: None   Collection Time: 11/23/20 10:25 AM   Specimen: Nasopharyngeal Swab  Result Value Ref Range Status   SARS Coronavirus 2 NEGATIVE NEGATIVE Final    Comment: (NOTE) SARS-CoV-2 target nucleic acids are NOT DETECTED.  The SARS-CoV-2 RNA is generally detectable in upper and lower respiratory specimens during the acute phase of infection. Negative results do not preclude SARS-CoV-2 infection, do not rule out co-infections with other pathogens, and should not be used as the sole basis for treatment or other patient management decisions. Negative results must be combined with clinical observations, patient history, and epidemiological information. The expected result is Negative.  Fact Sheet for Patients: SugarRoll.be  Fact Sheet for Healthcare Providers: https://www.woods-mathews.com/  This test is not yet approved or cleared by the [place] FDA and  has been authorized for detection and/or diagnosis of SARS-CoV-2 by FDA under an Emergency Use Authorization (EUA). This EUA will remain  in effect (meaning this test can be used) for the duration of the COVID-19 declaration under Se ction 564(b)(1) of the Act, 21 U.S.C. section 360bbb-3(b)(1), unless the authorization is terminated or revoked sooner.  Performed at [facility], [place] [address], [place], [place] [address]   Urine culture     Status: Abnormal (Preliminary result)   Collection Time: 11/30/20  9:14 AM   Specimen: Urine, Random  Result Value Ref Range Status   Specimen Description   Final    URINE, RANDOM Performed at [facility], [address], [place], [place] [address]    Special Requests   Final    NONE Performed at [facility], [address], [place], [place] [address]    Culture (A)  Final    20,000 COLONIES/mL ENTEROBACTER AEROGENES SUSCEPTIBILITIES TO FOLLOW Performed at [facility], [place] [address] [place], [place] [address]    Report Status PENDING  Incomplete  SARS CORONAVIRUS 2 (TAT 6-24 HRS) Nasopharyngeal Nasopharyngeal Swab     Status: None   Collection Time: 11/30/20  1:55 PM   Specimen: Nasopharyngeal Swab  Result Value Ref Range Status   SARS Coronavirus 2 NEGATIVE NEGATIVE Final    Comment: (NOTE) SARS-CoV-2 target nucleic acids are NOT DETECTED.  The SARS-CoV-2 RNA is generally detectable in upper and lower respiratory specimens during the acute phase of infection. Negative results do not preclude SARS-CoV-2 infection, do not rule out co-infections with other pathogens, and should not be used as the sole basis for treatment or other patient management decisions. Negative results must be combined with clinical observations, patient history, and epidemiological information. The expected result is Negative.  Fact Sheet for Patients: SugarRoll.be  Fact Sheet for Healthcare Providers: https://www.woods-mathews.com/  This test is not yet approved or cleared by the [place] FDA and  has been authorized for detection and/or diagnosis of SARS-CoV-2 by FDA under an Emergency Use Authorization (EUA). This EUA will remain  in effect (meaning this test can be used) for the duration of the COVID-19 declaration under Se ction 564(b)(1) of the Act, 21  U.S.C. section 360bbb-3(b)(1), unless the authorization is terminated or revoked sooner.  Performed at [facility], [place] [address] [place], [place] [address]   Blood culture (routine x 2)     Status: None (Preliminary result)   Collection Time: 11/30/20  2:26 PM   Specimen: BLOOD  Result Value Ref Range Status   Specimen Description BLOOD  Final   Special Requests NONE  Final   Culture   Final    NO GROWTH < 24 HOURS Performed at [facility], [address], [place], [place] [address]    Report Status PENDING  Incomplete  Blood culture (routine x 2)     Status: None (Preliminary result)   Collection Time: 11/30/20  2:26 PM   Specimen: BLOOD  Result Value Ref Range Status   Specimen Description BLOOD  Final   Special Requests NONE  Final   Culture   Final    NO GROWTH < 24 HOURS Performed at [facility], [address], [place], [place] [address]    Report Status PENDING  Incomplete         Radiology Studies: CT ABDOMEN PELVIS WO CONTRAST  Result Date: 11/30/2020 CLINICAL DATA:  Abdominal pain, fever. Left retrograde ureteral stent placement on 11/25/2020 EXAM: CT ABDOMEN AND PELVIS WITHOUT CONTRAST TECHNIQUE: Multidetector CT imaging of the abdomen and pelvis was performed following the standard protocol without IV contrast. COMPARISON:  CT 02/19/2014 FINDINGS: Lower chest: Dependent bibasilar consolidation and/or atelectasis. Heart size within normal limits. Coronary artery calcification. Hepatobiliary: Unremarkable unenhanced appearance of the liver. No focal liver lesion identified. Gallbladder within normal limits. No hyperdense gallstone. No biliary dilatation. Pancreas: Unremarkable. No pancreatic ductal dilatation or surrounding inflammatory changes. Spleen: Normal in size without focal abnormality. Adrenals/Urinary Tract: Unremarkable adrenal glands. Left-sided double-J nephroureteral stent is in place and appears appropriately positioned. Mild left  hydronephrosis. There are multiple small stones or stone fragments within the left kidney. Simple fluid attenuation collection within the retroperitoneum adjacent to the left ureter and extending into patient's left inguinal hernia. Collection measures approximately 18.0 x 4.3 x 6.3 cm (series 7, image 90; series 3, image 64). Mild left-sided perinephric stranding. Multiple punctate nonobstructing stones within the right kidney, largest measuring up to 5 mm. No right-sided hydronephrosis. Urinary bladder within normal limits. Stomach/Bowel: Small paraesophageal hernia versus distal esophageal diverticulum. Stomach  within normal limits. No dilated loops of small bowel. Moderate volume of stool throughout the colon. No focal bowel wall thickening or inflammatory changes. A normal appendix is present in the right lower quadrant. Vascular/Lymphatic: Atherosclerotic calcifications throughout the aortoiliac axis. No aneurysm. No abdominopelvic lymphadenopathy. Reproductive: Prostate is unremarkable. Other: No pneumoperitoneum. Left inguinal hernia with retroperitoneal fluid tracking into the hernia sac. Small left-sided hydrocele. Mild subcutaneous edema surrounds the left hemiscrotum. No soft tissue gas. Musculoskeletal: Nonspecific soft tissue calcifications overlie the bilateral gluteal regions and lateral aspect of the proximal thighs. No acute osseous findings. Mild degenerative changes of the lumbar spine. IMPRESSION: 1. Left-sided double-J nephroureteral stent is in place and appears appropriately positioned. Mild left hydronephrosis. 2. Large simple fluid attenuation collection within the retroperitoneum adjacent to the left ureter and extending into patient's left inguinal hernia. Collection measures approximately 18.0 x 4.3 x 6.3 cm. 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 3. Bilateral nephrolithiasis. 4. Dependent bibasilar consolidation and/or atelectasis. 5. Small left-sided hydrocele. Mild subcutaneous  edema surrounding the left hemiscrotum, which may be reactive. Correlate with physical exam to exclude cellulitis. No soft tissue gas. Aortic Atherosclerosis (ICD10-I70.0). Electronically Signed   By: Duanne GuessNicholas  Plundo D.O.   On: 11/30/2020 13:01   DG Chest Portable 1 View  Result Date: 11/30/2020 CLINICAL DATA:  Cough and shortness of breath EXAM: PORTABLE CHEST 1 VIEW COMPARISON:  March 05, 2018 FINDINGS: There is patchy airspace opacity with atelectasis in the right base. Lungs elsewhere clear. Heart size and pulmonary vascularity are normal. No adenopathy. No bone lesions. IMPRESSION: Ill-defined airspace opacity, likely representing combination of pneumonia and atelectasis right base. Lungs elsewhere clear. Cardiac silhouette within normal limits. Electronically Signed   By: Bretta BangWilliam  Woodruff III M.D.   On: 11/30/2020 09:43   US SCROTUM W/DOPPLER  Result Date: 11/30/2020 CLINICAL DATA:  Left scrotal swelling. EXAM: SCROTAL ULTRASOUND DOPPLER ULTRASOUND OF THE TESTICLES TECHNIQUE: Complete ultrasound examination of the testicles, epididymis, and other scrotal structures was performed. Color and spectral Doppler ultrasound were also utilized to evaluate blood flow to the testicles. COMPARISON:  None. FINDINGS: Right testicle Measurements: 4.6 cm x 2.0 cm x 3.1 cm. No mass or microlithiasis visualized. Left testicle Measurements: 3.7 cm x 2.8 cm x 2.8 cm. No mass or microlithiasis visualized. Right epididymis: A 0.4 cm x 0.4 cm x 0.4 cm cyst is noted within the right epididymal head. Left epididymis: A 3.8 cm x 2.6 cm x 4.3 cm heterogeneous echogenic mass is seen involving the left epididymis and adjacent portion of the scrotal wall. This demonstrates increased flow on color Doppler evaluation. Hydrocele: Small bilateral hydroceles are seen, left larger than right. Varicocele:  None visualized. Pulsed Doppler interrogation of both testes demonstrates normal low resistance arterial and venous waveforms  bilaterally. IMPRESSION: 1. Findings consistent with marked severity left-sided epididymitis. Follow-up to resolution is recommended. 2. Small bilateral hydroceles. 3. Normal bilateral testicular flow. Electronically Signed   By: Aram Candelahaddeus  Houston M.D.   On: 11/30/2020 15:41        Scheduled Meds: . alfuzosin  10 mg Oral Q breakfast  . amLODipine  2.5 mg Oral Daily  . aspirin EC  81 mg Oral Daily  . Chlorhexidine Gluconate Cloth  6 each Topical Daily  . heparin  5,000 Units Subcutaneous Q8H  . insulin aspart  0-9 Units Subcutaneous TID WC  . isosorbide mononitrate  30 mg Oral Daily  . levothyroxine  50 mcg Oral QAC breakfast  . omega-3 acid ethyl esters  1 g Oral QHS  . pantoprazole  40 mg Oral Daily  . rosuvastatin  5 mg Oral QHS  . tamsulosin  0.4 mg Oral QPC supper   Continuous Infusions: . sodium chloride 75 mL/hr at 12/01/20 1753  . azithromycin Stopped (12/01/20 1659)  . cefTRIAXone (ROCEPHIN)  IV Stopped (12/01/20 1546)     LOS: 2 days   Time spent: 91min  Advait Buice C Jarrin Staley, DO [facility]  If 7PM-7AM, please contact night-coverage [email or web address]  12/02/2020, 7:04 AM

## 2020-12-02 NOTE — Evaluation (Signed)
Physical Therapy Evaluation Patient Details Name: Jacob Fowler MRN: [id number] DOB: January 30, 1951 Today's Date: 12/02/2020   History of Present Illness  Jacob Fowler is a 70 y.o. male with medical history significant of HTN, HLD, MS, nonobstructive CAD, type II DM, GERD, Hypothyroidism,  L hiatal hernia, chronic R leg pain, BPH, Multiple Sclerosis and nephrolithiasis s/p left ureteroscopic stone extraction performed 11/25/2020 by Dr. Alyson Ingles who presents by his daughter after concerns of bizarre behavior. Since stent placement, pt has become increasingly confused, has had visual hallucinations, and is markedly more unsteady on his feet with generalized weakness. Hallucinations and confusion seem to wax and wane, with pt reporting dots and bugs on the wall. Family thought it might be related to his pain medicine, so they stopped giving it to him but visual hallucinations persisted. Pt's temperature increased to 65F at home so family decided to bring him to ED for evaluation.    Clinical Impression  Patient at high risk for falls due to impulsive behavior, attempted to roll to side during supine to sitting requiring assistance to avoid falling off side of bed, very unsteady on feet with limited movement for RLE due to weakness and stiffness with limited flexion, able to transfer to [facility] to have a bowel movement before transferring to chair.  Patient required tactile assistance to move RLE when taking side steps and tolerated sitting up in chair with family members in room after therapy - nursing staff notified.  Patient will benefit from continued physical therapy in hospital and recommended venue below to increase strength, balance, endurance for safe ADLs and gait.    Follow Up Recommendations SNF    Equipment Recommendations  None recommended by PT    Recommendations for Other Services       Precautions / Restrictions Precautions Precautions: Fall Restrictions Weight Bearing Restrictions:  No      Mobility  Bed Mobility Overal bed mobility: Needs Assistance Bed Mobility: Supine to Sit     Supine to sit: Mod assist;Max assist     General bed mobility comments: increased time, poor safety due to rolling on side nearly falling off bedside    Transfers Overall transfer level: Needs assistance   Transfers: Sit to/from Stand;Stand Pivot Transfers Sit to Stand: Mod assist Stand pivot transfers: Mod assist;Max assist       General transfer comment: slow labored movement with poor return for moving RLE due to weakness and stiffness  Ambulation/Gait Ambulation/Gait assistance: Max assist Gait Distance (Feet): 4 Feet Assistive device: Rolling walker (2 wheeled) Gait Pattern/deviations: Decreased step length - right;Decreased step length - left;Decreased stance time - right;Decreased stride length;Antalgic Gait velocity: slow   General Gait Details: limited to 4-5 slow labored unsteady side steps requiring tactile assistance to move RLE due to weakness and poor standing balance  Stairs            Wheelchair Mobility    Modified Rankin (Stroke Patients Only)       Balance Overall balance assessment: Needs assistance Sitting-balance support: Feet supported;No upper extremity supported Sitting balance-Leahy Scale: Fair Sitting balance - Comments: seated at EOB   Standing balance support: During functional activity;Bilateral upper extremity supported Standing balance-Leahy Scale: Poor Standing balance comment: using RW                             Pertinent Vitals/Pain Pain Assessment: No/denies pain    Home Living Family/patient expects to be discharged to:: Private  residence Living Arrangements: Spouse/significant other Available Help at Discharge: Family;Available 24 hours/day Type of Home: House Home Access: Stairs to enter Entrance Stairs-Rails: Right Entrance Stairs-Number of Steps: 2 Home Layout: Two level;Able to live on main  level with bedroom/bathroom;Full bath on main level Home Equipment: Walker - 2 wheels;Cane - single point;Transport chair      Prior Function Level of Independence: Needs assistance   Gait / Transfers Assistance Needed: household ambulator using RW, uses SPC outside of house, transport wheelchair for longer distances  ADL's / Homemaking Assistance Needed: assisted by family        Hand Dominance        Extremity/Trunk Assessment   Upper Extremity Assessment Upper Extremity Assessment: Generalized weakness    Lower Extremity Assessment Lower Extremity Assessment: Generalized weakness;RLE deficits/detail RLE Deficits / Details: grossly 2+/5, very stiff with limited knee flexion RLE: Unable to fully assess due to immobilization RLE Sensation: WNL RLE Coordination: WNL    Cervical / Trunk Assessment Cervical / Trunk Assessment: Kyphotic  Communication   Communication: No difficulties  Cognition Arousal/Alertness: Awake/alert Behavior During Therapy: WFL for tasks assessed/performed;Impulsive Overall Cognitive Status: Within Functional Limits for tasks assessed                                 General Comments: patient requires verbal cues for safety due to impulsive behaivor      General Comments      Exercises     Assessment/Plan    PT Assessment Patient needs continued PT services  PT Problem List Decreased strength;Decreased activity tolerance;Decreased balance;Decreased mobility       PT Treatment Interventions DME instruction;Stair training;Gait training;Functional mobility training;Therapeutic activities;Therapeutic exercise;Patient/family education;Balance training    PT Goals (Current goals can be found in the Care Plan section)  Acute Rehab PT Goals Patient Stated Goal: return home with family to assist PT Goal Formulation: With patient/family Time For Goal Achievement: 12/16/20 Potential to Achieve Goals: Good    Frequency Min  3X/week   Barriers to discharge        Co-evaluation               AM-PAC PT "6 Clicks" Mobility  Outcome Measure Help needed turning from your back to your side while in a flat bed without using bedrails?: A Lot Help needed moving from lying on your back to sitting on the side of a flat bed without using bedrails?: A Lot Help needed moving to and from a bed to a chair (including a wheelchair)?: A Lot Help needed standing up from a chair using your arms (e.g., wheelchair or bedside chair)?: A Lot Help needed to walk in hospital room?: A Lot Help needed climbing 3-5 steps with a railing? : Total 6 Click Score: 11    End of Session   Activity Tolerance: Patient tolerated treatment well;Patient limited by fatigue Patient left: in chair;with call bell/phone within reach;with family/visitor present;with chair alarm set Nurse Communication: Mobility status PT Visit Diagnosis: Unsteadiness on feet (R26.81);Other abnormalities of gait and mobility (R26.89);Muscle weakness (generalized) (M62.81)    Time: 4193-7902 PT Time Calculation (min) (ACUTE ONLY): 36 min   Charges:   PT Evaluation $PT Eval Moderate Complexity: 1 Mod PT Treatments $Therapeutic Activity: 23-37 mins        3:58 PM, 12/02/20 Lonell Grandchild, MPT Physical Therapist with [facility] [phone] office [phone] mobile phone

## 2020-12-02 NOTE — Plan of Care (Signed)
  Problem: Acute Rehab PT Goals(only PT should resolve) Goal: Pt Will Go Supine/Side To Sit Outcome: Progressing Flowsheets (Taken 12/02/2020 1600) Pt will go Supine/Side to Sit: with minimal assist Goal: Patient Will Transfer Sit To/From Stand Outcome: Progressing Flowsheets (Taken 12/02/2020 1600) Patient will transfer sit to/from stand: with minimal assist Goal: Pt Will Transfer Bed To Chair/Chair To Bed Outcome: Progressing Flowsheets (Taken 12/02/2020 1600) Pt will Transfer Bed to Chair/Chair to Bed:  with min assist  with mod assist Goal: Pt Will Ambulate Outcome: Progressing Flowsheets (Taken 12/02/2020 1600) Pt will Ambulate:  15 feet  with moderate assist  with minimal assist  with rolling walker   4:00 PM, 12/02/20 Lonell Grandchild, MPT Physical Therapist with [facility] [phone] office [phone] mobile phone

## 2020-12-02 NOTE — Progress Notes (Signed)
Subjective: Abdominal pain resolved. Patient continued to have confusion. Creatinine 1.1. 2L urine output. Urine culture growing enterobacter resistant to rocephin  Objective: Vital signs in last 24 hours: Temp:  [98.1 F (36.7 C)-98.4 F (36.9 C)] 98.1 F (36.7 C) (05/19 0600) Pulse Rate:  [79-83] 81 (05/19 0600) Resp:  [16-20] 16 (05/19 0600) BP: (116-152)/(61-71) 141/71 (05/19 0600) SpO2:  [95 %-97 %] 95 % (05/19 0600)  Intake/Output from previous day: 05/18 0701 - 05/19 0700 In: 3388.5 [P.O.:1407; I.V.:1631.5; IV Piggyback:350] Out: 2050 [Urine:2050] Intake/Output this shift: Total I/O In: 240 [P.O.:240] Out: 750 [Urine:750]  Physical Exam:  General:alert, cooperative and appears stated age GI: soft, non tender, normal bowel sounds, no palpable masses, no organomegaly, no inguinal hernia Male genitalia: not done Extremities: extremities normal, atraumatic, no cyanosis or edema  Lab Results: Recent Labs    11/30/20 1847 12/01/20 0635 12/02/20 0707  HGB 10.2* 9.6* 10.2*  HCT 30.7* 30.3* 30.9*   BMET Recent Labs    12/01/20 0635 12/02/20 0707  NA 136 134*  K 5.2* 4.3  CL 103 104  CO2 25 22  GLUCOSE 168* 176*  BUN 44* 26*  CREATININE 1.89* 1.13  CALCIUM 9.2 8.8*   No results for input(s): LABPT, INR in the last 72 hours. No results for input(s): LABURIN in the last 72 hours. Results for orders placed or performed during the hospital encounter of 11/30/20  Urine culture     Status: Abnormal   Collection Time: 11/30/20  9:14 AM   Specimen: Urine, Random  Result Value Ref Range Status   Specimen Description   Final    URINE, RANDOM Performed at [facility], [address], [place], [place] [address]    Special Requests   Final    NONE Performed at [facility], [address] [place], [place] [address]    Culture 20,000 COLONIES/mL ENTEROBACTER AEROGENES (A)  Final   Report Status 12/02/2020 FINAL  Final   Organism ID, Bacteria ENTEROBACTER  AEROGENES (A)  Final      Susceptibility   Enterobacter aerogenes - MIC*    CEFAZOLIN >=64 RESISTANT Resistant     CEFEPIME <=0.12 SENSITIVE Sensitive     CEFTRIAXONE 8 RESISTANT Resistant     CIPROFLOXACIN <=0.25 SENSITIVE Sensitive     GENTAMICIN <=1 SENSITIVE Sensitive     IMIPENEM <=0.25 SENSITIVE Sensitive     NITROFURANTOIN 128 RESISTANT Resistant     TRIMETH/SULFA <=20 SENSITIVE Sensitive     PIP/TAZO >=128 RESISTANT Resistant     * 20,000 COLONIES/mL ENTEROBACTER AEROGENES  SARS CORONAVIRUS 2 (TAT 6-24 HRS) Nasopharyngeal Nasopharyngeal Swab     Status: None   Collection Time: 11/30/20  1:55 PM   Specimen: Nasopharyngeal Swab  Result Value Ref Range Status   SARS Coronavirus 2 NEGATIVE NEGATIVE Final    Comment: (NOTE) SARS-CoV-2 target nucleic acids are NOT DETECTED.  The SARS-CoV-2 RNA is generally detectable in upper and lower respiratory specimens during the acute phase of infection. Negative results do not preclude SARS-CoV-2 infection, do not rule out co-infections with other pathogens, and should not be used as the sole basis for treatment or other patient management decisions. Negative results must be combined with clinical observations, patient history, and epidemiological information. The expected result is Negative.  Fact Sheet for Patients: SugarRoll.be  Fact Sheet for Healthcare Providers: https://www.woods-mathews.com/  This test is not yet approved or cleared by the [place] FDA and  has been authorized for detection and/or diagnosis of SARS-CoV-2 by FDA  under an Emergency Use Authorization (EUA). This EUA will remain  in effect (meaning this test can be used) for the duration of the COVID-19 declaration under Se ction 564(b)(1) of the Act, 21 U.S.C. section 360bbb-3(b)(1), unless the authorization is terminated or revoked sooner.  Performed at [facility], [place] [address], [place],  [place] [address]   Blood culture (routine x 2)     Status: None (Preliminary result)   Collection Time: 11/30/20  2:26 PM   Specimen: BLOOD  Result Value Ref Range Status   Specimen Description BLOOD  Final   Special Requests NONE  Final   Culture   Final    NO GROWTH 2 DAYS Performed at [facility], [address] [place], [place] [address]    Report Status PENDING  Incomplete  Blood culture (routine x 2)     Status: None (Preliminary result)   Collection Time: 11/30/20  2:26 PM   Specimen: BLOOD  Result Value Ref Range Status   Specimen Description BLOOD  Final   Special Requests NONE  Final   Culture   Final    NO GROWTH 2 DAYS Performed at [facility], [address], [place], [place] [address]    Report Status PENDING  Incomplete    Studies/Results: CT ABDOMEN PELVIS WO CONTRAST  Result Date: 11/30/2020 CLINICAL DATA:  Abdominal pain, fever. Left retrograde ureteral stent placement on 11/25/2020 EXAM: CT ABDOMEN AND PELVIS WITHOUT CONTRAST TECHNIQUE: Multidetector CT imaging of the abdomen and pelvis was performed following the standard protocol without IV contrast. COMPARISON:  CT 02/19/2014 FINDINGS: Lower chest: Dependent bibasilar consolidation and/or atelectasis. Heart size within normal limits. Coronary artery calcification. Hepatobiliary: Unremarkable unenhanced appearance of the liver. No focal liver lesion identified. Gallbladder within normal limits. No hyperdense gallstone. No biliary dilatation. Pancreas: Unremarkable. No pancreatic ductal dilatation or surrounding inflammatory changes. Spleen: Normal in size without focal abnormality. Adrenals/Urinary Tract: Unremarkable adrenal glands. Left-sided double-J nephroureteral stent is in place and appears appropriately positioned. Mild left hydronephrosis. There are multiple small stones or stone fragments within the left kidney. Simple fluid attenuation collection within the retroperitoneum adjacent to the left ureter and  extending into patient's left inguinal hernia. Collection measures approximately 18.0 x 4.3 x 6.3 cm (series 7, image 90; series 3, image 64). Mild left-sided perinephric stranding. Multiple punctate nonobstructing stones within the right kidney, largest measuring up to 5 mm. No right-sided hydronephrosis. Urinary bladder within normal limits. Stomach/Bowel: Small paraesophageal hernia versus distal esophageal diverticulum. Stomach within normal limits. No dilated loops of small bowel. Moderate volume of stool throughout the colon. No focal bowel wall thickening or inflammatory changes. A normal appendix is present in the right lower quadrant. Vascular/Lymphatic: Atherosclerotic calcifications throughout the aortoiliac axis. No aneurysm. No abdominopelvic lymphadenopathy. Reproductive: Prostate is unremarkable. Other: No pneumoperitoneum. Left inguinal hernia with retroperitoneal fluid tracking into the hernia sac. Small left-sided hydrocele. Mild subcutaneous edema surrounds the left hemiscrotum. No soft tissue gas. Musculoskeletal: Nonspecific soft tissue calcifications overlie the bilateral gluteal regions and lateral aspect of the proximal thighs. No acute osseous findings. Mild degenerative changes of the lumbar spine. IMPRESSION: 1. Left-sided double-J nephroureteral stent is in place and appears appropriately positioned. Mild left hydronephrosis. 2. Large simple fluid attenuation collection within the retroperitoneum adjacent to the left ureter and extending into patient's left inguinal hernia. Collection measures approximately 18.0 x 4.3 x 6.3 cm. 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 3. Bilateral nephrolithiasis. 4. Dependent bibasilar consolidation and/or atelectasis. 5. Small  left-sided hydrocele. Mild subcutaneous edema surrounding the left hemiscrotum, which may be reactive. Correlate with physical exam to exclude cellulitis. No soft tissue gas. Aortic Atherosclerosis (ICD10-I70.0).  Electronically Signed   By: Davina Poke D.O.   On: 11/30/2020 13:01   US SCROTUM W/DOPPLER  Result Date: 11/30/2020 CLINICAL DATA:  Left scrotal swelling. EXAM: SCROTAL ULTRASOUND DOPPLER ULTRASOUND OF THE TESTICLES TECHNIQUE: Complete ultrasound examination of the testicles, epididymis, and other scrotal structures was performed. Color and spectral Doppler ultrasound were also utilized to evaluate blood flow to the testicles. COMPARISON:  None. FINDINGS: Right testicle Measurements: 4.6 cm x 2.0 cm x 3.1 cm. No mass or microlithiasis visualized. Left testicle Measurements: 3.7 cm x 2.8 cm x 2.8 cm. No mass or microlithiasis visualized. Right epididymis: A 0.4 cm x 0.4 cm x 0.4 cm cyst is noted within the right epididymal head. Left epididymis: A 3.8 cm x 2.6 cm x 4.3 cm heterogeneous echogenic mass is seen involving the left epididymis and adjacent portion of the scrotal wall. This demonstrates increased flow on color Doppler evaluation. Hydrocele: Small bilateral hydroceles are seen, left larger than right. Varicocele:  None visualized. Pulsed Doppler interrogation of both testes demonstrates normal low resistance arterial and venous waveforms bilaterally. IMPRESSION: 1. Findings consistent with marked severity left-sided epididymitis. Follow-up to resolution is recommended. 2. Small bilateral hydroceles. 3. Normal bilateral testicular flow. Electronically Signed   By: Virgina Norfolk M.D.   On: 11/30/2020 15:41    Assessment/Plan: 69yo with urinoma and left epididymo-orchitis  1. Continue foley catheter to straight drain. We will schedule for CT stone study tomorrow morning to ensure improvement/resolution of the urinoma 2. Epididymo-orchitis: antibiotics switched to bactrim   LOS: 2 days   Jacob Fowler 12/02/2020, 10:58 AM

## 2020-12-03 ENCOUNTER — Other Ambulatory Visit: Payer: Self-pay

## 2020-12-03 ENCOUNTER — Inpatient Hospital Stay (HOSPITAL_COMMUNITY): Payer: PPO

## 2020-12-03 DIAGNOSIS — R390 Extravasation of urine: Secondary | ICD-10-CM

## 2020-12-03 LAB — CBC
HCT: 29.4 % — ABNORMAL LOW (ref 39.0–52.0)
Hemoglobin: 9.5 g/dL — ABNORMAL LOW (ref 13.0–17.0)
MCH: 28.3 pg (ref 26.0–34.0)
MCHC: 32.3 g/dL (ref 30.0–36.0)
MCV: 87.5 fL (ref 80.0–100.0)
Platelets: 229 10*3/uL (ref 150–400)
RBC: 3.36 MIL/uL — ABNORMAL LOW (ref 4.22–5.81)
RDW: 14.6 % (ref 11.5–15.5)
WBC: 8 10*3/uL (ref 4.0–10.5)
nRBC: 0 % (ref 0.0–0.2)

## 2020-12-03 LAB — BASIC METABOLIC PANEL
Anion gap: 8 (ref 5–15)
BUN: 25 mg/dL — ABNORMAL HIGH (ref 8–23)
CO2: 23 mmol/L (ref 22–32)
Calcium: 8.7 mg/dL — ABNORMAL LOW (ref 8.9–10.3)
Chloride: 105 mmol/L (ref 98–111)
Creatinine, Ser: 1.18 mg/dL (ref 0.61–1.24)
GFR, Estimated: >60 mL/min (ref >60)
Glucose, Bld: 164 mg/dL — ABNORMAL HIGH (ref 70–99)
Potassium: 4.1 mmol/L (ref 3.5–5.1)
Sodium: 136 mmol/L (ref 135–145)

## 2020-12-03 LAB — GLUCOSE, CAPILLARY
Glucose-Capillary: 161 mg/dL — ABNORMAL HIGH (ref 70–99)
Glucose-Capillary: 150 mg/dL — ABNORMAL HIGH (ref 70–99)
Glucose-Capillary: 224 mg/dL — ABNORMAL HIGH (ref 70–99)

## 2020-12-03 MED ORDER — AZITHROMYCIN 250 MG PO TABS
500.0000 mg | ORAL_TABLET | Freq: Every day | ORAL | Status: DC
  Administered 2020-12-03: 500 mg via ORAL
  Filled 2020-12-03: qty 2

## 2020-12-03 MED ORDER — SULFAMETHOXAZOLE-TRIMETHOPRIM 800-160 MG PO TABS: 1.0000 | ORAL_TABLET | Freq: Two times a day (BID) | ORAL | 0 refills | Status: DC

## 2020-12-03 NOTE — Care Management Important Message (Signed)
Important Message  Patient Details  Name: Jacob Fowler MRN: [id number] Date of Birth: 04/12/1951   Medicare Important Message Given:  Yes     Tommy Medal 12/03/2020, 12:17 PM

## 2020-12-03 NOTE — TOC Transition Note (Signed)
Transition of Care [facility]) - CM/SW Discharge Note   Patient Details  Name: Jacob Fowler MRN: [id number] Date of Birth: Oct 29, 1950  Transition of Care [facility]) CM/SW Contact:  Iona Beard, [place] Phone Number: 12/03/2020, 3:56 PM   Clinical Narrative:    CSW reached out to local [facility] agencies and could not find a company to accept pts insurance for [facility] PT. CSW updated MD and family of this. Family is understanding and will reach out to PCP when pt returns home to set up services. TOC signing off.    Final next level of care: Home/Self Care Barriers to Discharge: No [facility] will accept this patient   Patient Goals and CMS Choice Patient states their goals for this hospitalization and ongoing recovery are:: Return home with family CMS Medicare.gov Compare Post Acute Care list provided to:: Patient Represenative (must comment) Choice offered to / list presented to : [place]  Discharge Placement                       Discharge Plan and Services     Post Acute Care Choice: Home Health          DME Arranged: N/A DME Agency: NA       HH Arranged: NA HH Agency: NA        Social Determinants of Health (SDOH) Interventions     Readmission Risk Interventions Readmission Risk Prevention Plan 12/03/2020  Transportation Screening Complete  Home Care Screening Complete  Medication Review (RN CM) Complete  Some recent data might be hidden

## 2020-12-03 NOTE — Discharge Summary (Signed)
Physician Discharge Summary  Jacob Fowler IFO:[id number] DOB: December 27, 1950 DOA: 11/30/2020  PCP: [name], FNP  Admit date: 11/30/2020 Discharge date: 12/03/2020  Admitted From: Home Disposition: Home  Recommendations for Outpatient Follow-up:  1. Follow up with PCP in 1-2 weeks 2. Please obtain BMP/CBC in one week 3. Please follow up with urology as scheduled:  Home Health: None Equipment/Devices: None  Discharge Condition: Stable CODE STATUS: Full Diet recommendation: Low-salt low-fat diabetic diet  Brief/Interim Summary: Jacob Fowler a 70 y.o.malewith medical history significant ofHTN, HLD, MS, nonobstructive CAD, type II DM,GERD, Hypothyroidism, L hiatal hernia, chronic R leg pain, BPH, Multiple Sclerosis and nephrolithiasis s/p left ureteroscopic stone extraction performed 11/25/2020 by Dr. Alyson Ingles who presents by his daughter after concerns of bizarre behavior. Since stent placement, pt has become increasingly confused, has had visual hallucinations, and is markedly more unsteady on his feet with generalized weakness. Hallucinations and confusion seem to wax and wane, with pt reporting dots and bugs on the wall. Family thought it might be related to his pain medicine, so they stopped giving it to him but visual hallucinations persisted. Pt's temperature increased to 23F at home so family decided to bring him to ED for evaluation.In ED: CT abdomen/pelvisdemonstratesureteric stent is in place, but there is a large simple fluid attenuation collection within the retroperitoneum adjacent to the left ureter and extending into patient's left inguinal hernia. There is a small left-sided hydrocele, mild subcutaneous edema which may be reactive.Doppler US revealed epididymitis. Dr. Doroteo Bradford was notified and recommended admission at [facility] and possibly be transferred to [facility] for nephrostomy tube placement by IR if repeat imaging does not show ongoing  improvement.  Patient admitted as above with sepsis secondary to epididymitis and orchitis status post recent procedure.  Also concern for possible pneumonia in the setting of acute hypoxia probably aspiration the setting of mental status changes which also resolved with supportive care and antibiotics.  Creatinine downtrending with IV fluids after Foley placement.  Urology following and appreciate their insight and recommendations, given patient's drastic improvement clinically as well as improvement on imaging and resolution of fluids patient will be discharged home today with outpatient follow-up with urology.  Continue Bactrim for 14 days per urology recommendations.  Patient's mental status is now back to baseline, questionable sundowning versus encephalopathy in the setting of infection is now resolving and patient is back to baseline.  Patient otherwise stable and agreeable for discharge home.  Discharge Diagnoses:  Principal Problem:   AKI (acute kidney injury) ([place]) Active Problems:   Diabetes mellitus type 2 in nonobese (HCC)   Generalized weakness   Hypothyroidism   Gastroesophageal reflux disease   Nephrolithiasis   Epididymitis   Postoperative delirium   Hyperkalemia   Inguinal hernia   Hydronephrosis   Scrotum swelling   Hyperlipidemia   BPH with obstruction/lower urinary tract symptoms   Respiratory distress    Discharge Instructions  Discharge Instructions    Call MD for:  extreme fatigue   Complete by: As directed    Call MD for:  redness, tenderness, or signs of infection (pain, swelling, redness, odor or green/yellow discharge around incision site)   Complete by: As directed    Call MD for:  severe uncontrolled pain   Complete by: As directed    Diet - low sodium heart healthy   Complete by: As directed    Discharge wound care:   Complete by: As directed    Per urology   Increase  activity slowly   Complete by: As directed      Allergies as of 12/03/2020    No Known Allergies     Medication List    TAKE these medications   alfuzosin 10 MG 24 hr tablet Commonly known as: UROXATRAL Take 1 tablet (10 mg total) by mouth in the morning and at bedtime.   amLODipine 2.5 MG tablet Commonly known as: NORVASC Take 1 tablet (2.5 mg total) by mouth daily.   aspirin 81 MG EC tablet Take 81 mg by mouth daily.   Betaseron 0.3 MG Kit injection Generic drug: Interferon Beta-1b Inject 0.25 mg into the skin every other day. What changed:   how much to take  when to take this   cholecalciferol 1000 units tablet Commonly known as: VITAMIN D Take 1,000 Units by mouth daily.   cyanocobalamin 1000 MCG/ML injection Commonly known as: (VITAMIN B-12) INJECT 1ML INTO THE SKIN ONCE MONTH What changed:   how much to take  how to take this  when to take this  additional instructions   fish oil-omega-3 fatty acids 1000 MG capsule Take 1 g by mouth at bedtime.   glucose blood test strip Use as instructed   isosorbide mononitrate 60 MG 24 hr tablet Commonly known as: IMDUR Take 0.5 tablets (30 mg total) by mouth daily.   levothyroxine 50 MCG tablet Commonly known as: SYNTHROID Take 1 tablet (50 mcg total) by mouth daily before breakfast.   lisinopril 10 MG tablet Commonly known as: ZESTRIL Take 1 tablet (10 mg total) by mouth daily.   metFORMIN 1000 MG tablet Commonly known as: GLUCOPHAGE Take 1 tablet (1,000 mg total) by mouth 2 (two) times daily with a meal.   omeprazole 20 MG capsule Commonly known as: PRILOSEC Take 1 capsule (20 mg total) by mouth daily.   pioglitazone 30 MG tablet Commonly known as: ACTOS Take 1 tablet (30 mg total) by mouth daily.   rosuvastatin 10 MG tablet Commonly known as: Crestor Take 1 tablet (10 mg total) by mouth at bedtime.   sulfamethoxazole-trimethoprim 800-160 MG tablet Commonly known as: BACTRIM DS Take 1 tablet by mouth every 12 (twelve) hours.   SYRINGE-NEEDLE (DISP) 3 ML 25G X 5/8"  3 ML Misc Commonly known as: B-D SYRINGE/NEEDLE 3CC/25GX5/8 Use to give once a month B12 injections   tamsulosin 0.4 MG Caps capsule Commonly known as: FLOMAX Take 1 capsule (0.4 mg total) by mouth daily after supper.            Discharge Care Instructions  (From admission, onward)         Start     Ordered   12/03/20 0000  Discharge wound care:       Comments: Per urology   12/03/20 1502          No Known Allergies  Consultations:  Urology   Procedures/Studies: CT ABDOMEN PELVIS WO CONTRAST  Result Date: 11/30/2020 CLINICAL DATA:  Abdominal pain, fever. Left retrograde ureteral stent placement on 11/25/2020 EXAM: CT ABDOMEN AND PELVIS WITHOUT CONTRAST TECHNIQUE: Multidetector CT imaging of the abdomen and pelvis was performed following the standard protocol without IV contrast. COMPARISON:  CT 02/19/2014 FINDINGS: Lower chest: Dependent bibasilar consolidation and/or atelectasis. Heart size within normal limits. Coronary artery calcification. Hepatobiliary: Unremarkable unenhanced appearance of the liver. No focal liver lesion identified. Gallbladder within normal limits. No hyperdense gallstone. No biliary dilatation. Pancreas: Unremarkable. No pancreatic ductal dilatation or surrounding inflammatory changes. Spleen: Normal in size without focal abnormality. Adrenals/Urinary  Tract: Unremarkable adrenal glands. Left-sided double-J nephroureteral stent is in place and appears appropriately positioned. Mild left hydronephrosis. There are multiple small stones or stone fragments within the left kidney. Simple fluid attenuation collection within the retroperitoneum adjacent to the left ureter and extending into patient's left inguinal hernia. Collection measures approximately 18.0 x 4.3 x 6.3 cm (series 7, image 90; series 3, image 64). Mild left-sided perinephric stranding. Multiple punctate nonobstructing stones within the right kidney, largest measuring up to 5 mm. No right-sided  hydronephrosis. Urinary bladder within normal limits. Stomach/Bowel: Small paraesophageal hernia versus distal esophageal diverticulum. Stomach within normal limits. No dilated loops of small bowel. Moderate volume of stool throughout the colon. No focal bowel wall thickening or inflammatory changes. A normal appendix is present in the right lower quadrant. Vascular/Lymphatic: Atherosclerotic calcifications throughout the aortoiliac axis. No aneurysm. No abdominopelvic lymphadenopathy. Reproductive: Prostate is unremarkable. Other: No pneumoperitoneum. Left inguinal hernia with retroperitoneal fluid tracking into the hernia sac. Small left-sided hydrocele. Mild subcutaneous edema surrounds the left hemiscrotum. No soft tissue gas. Musculoskeletal: Nonspecific soft tissue calcifications overlie the bilateral gluteal regions and lateral aspect of the proximal thighs. No acute osseous findings. Mild degenerative changes of the lumbar spine. IMPRESSION: 1. Left-sided double-J nephroureteral stent is in place and appears appropriately positioned. Mild left hydronephrosis. 2. Large simple fluid attenuation collection within the retroperitoneum adjacent to the left ureter and extending into patient's left inguinal hernia. Collection measures approximately 18.0 x 4.3 x 6.3 cm. 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 3. Bilateral nephrolithiasis. 4. Dependent bibasilar consolidation and/or atelectasis. 5. Small left-sided hydrocele. Mild subcutaneous edema surrounding the left hemiscrotum, which may be reactive. Correlate with physical exam to exclude cellulitis. No soft tissue gas. Aortic Atherosclerosis (ICD10-I70.0). Electronically Signed   By: Davina Poke D.O.   On: 11/30/2020 13:01   DG Chest Portable 1 View  Result Date: 11/30/2020 CLINICAL DATA:  Cough and shortness of breath EXAM: PORTABLE CHEST 1 VIEW COMPARISON:  March 05, 2018 FINDINGS: There is patchy airspace opacity with atelectasis in the right  base. Lungs elsewhere clear. Heart size and pulmonary vascularity are normal. No adenopathy. No bone lesions. IMPRESSION: Ill-defined airspace opacity, likely representing combination of pneumonia and atelectasis right base. Lungs elsewhere clear. Cardiac silhouette within normal limits. Electronically Signed   By: Lowella Grip III M.D.   On: 11/30/2020 09:43   DG C-Arm 1-60 Min-No Report  Result Date: 11/25/2020 Fluoroscopy was utilized by the requesting physician.  No radiographic interpretation.   CT RENAL STONE STUDY  Result Date: 12/03/2020 CLINICAL DATA:  70 year old male with history of abdominal pain with urinary extravasation on prior study. Follow-up examination. EXAM: CT ABDOMEN AND PELVIS WITHOUT CONTRAST TECHNIQUE: Multidetector CT imaging of the abdomen and pelvis was performed following the standard protocol without IV contrast. COMPARISON:  CT the abdomen and pelvis 11/30/2020. FINDINGS: Lower chest: Linear areas of scarring are noted in the lung bases bilaterally. Elevation of the right hemidiaphragm. Atherosclerotic calcifications in the descending thoracic aorta as well as the left main, left anterior descending, left circumflex and right coronary arteries. Faint calcifications of the aortic valve. Hepatobiliary: Diffuse low attenuation throughout the visualized hepatic parenchyma, indicative of hepatic steatosis. No discrete cystic or solid hepatic lesions are confidently identified on today's noncontrast CT examination. Calcified granuloma in segment 4B of the liver incidentally noted. Unenhanced appearance of the gallbladder is normal. Pancreas: No definite pancreatic mass or peripancreatic fluid collections or inflammatory changes are noted on today's noncontrast CT examination.  Spleen: Unremarkable. Adrenals/Urinary Tract: Again noted is a left-sided double-J ureteral stent, with the proximal loop reformed in the left renal pelvis and distal loop reformed in the urinary bladder.  There continues to be some abnormal fluid around the left ureter and in the retroperitoneum, although this has significantly decreased compared to the prior study. The largest portion of this collection currently measures approximately 4.9 x 2.3 cm (axial image 67 of series 2) as compared with 6.3 x 4.3 cm on prior study 11/30/2020. Multiple nonobstructive calculi are noted within the collecting systems of both kidneys measuring up to 6 mm in the lower pole collecting system of the right kidney and 6 mm in the interpolar collecting system of the left kidney. Urinary bladder is nearly completely decompressed around an indwelling Foley balloon catheter. Small amount of gas non dependently in the urinary bladder is presumably iatrogenic. Bilateral adrenal glands are normal in appearance. Stomach/Bowel: Unenhanced appearance of the stomach is normal. No pathologic dilatation of small bowel or colon. Normal appendix. Vascular/Lymphatic: Aortic atherosclerosis. No lymphadenopathy noted in the abdomen or pelvis. Reproductive: Prostate gland and seminal vesicles are unremarkable in appearance. Other: No significant volume of ascites.  No pneumoperitoneum. Musculoskeletal: There are no aggressive appearing lytic or blastic lesions noted in the visualized portions of the skeleton. IMPRESSION: 1. Left-sided double-J ureteral stent appears appropriately located. The extent of left perinephric and periureteric fluid has significantly decreased compared to the prior examination, as above. 2. Hepatic steatosis. 3. Aortic atherosclerosis, in addition to left main and 3 vessel coronary artery disease. Assessment for potential risk factor modification, dietary therapy or pharmacologic therapy may be warranted, if clinically indicated. 4. There are calcifications of the aortic valve. Echocardiographic correlation for evaluation of potential valvular dysfunction may be warranted if clinically indicated. 5. Additional incidental  findings, as above. Electronically Signed   By: Vinnie Langton M.D.   On: 12/03/2020 14:01   US SCROTUM W/DOPPLER  Result Date: 11/30/2020 CLINICAL DATA:  Left scrotal swelling. EXAM: SCROTAL ULTRASOUND DOPPLER ULTRASOUND OF THE TESTICLES TECHNIQUE: Complete ultrasound examination of the testicles, epididymis, and other scrotal structures was performed. Color and spectral Doppler ultrasound were also utilized to evaluate blood flow to the testicles. COMPARISON:  None. FINDINGS: Right testicle Measurements: 4.6 cm x 2.0 cm x 3.1 cm. No mass or microlithiasis visualized. Left testicle Measurements: 3.7 cm x 2.8 cm x 2.8 cm. No mass or microlithiasis visualized. Right epididymis: A 0.4 cm x 0.4 cm x 0.4 cm cyst is noted within the right epididymal head. Left epididymis: A 3.8 cm x 2.6 cm x 4.3 cm heterogeneous echogenic mass is seen involving the left epididymis and adjacent portion of the scrotal wall. This demonstrates increased flow on color Doppler evaluation. Hydrocele: Small bilateral hydroceles are seen, left larger than right. Varicocele:  None visualized. Pulsed Doppler interrogation of both testes demonstrates normal low resistance arterial and venous waveforms bilaterally. IMPRESSION: 1. Findings consistent with marked severity left-sided epididymitis. Follow-up to resolution is recommended. 2. Small bilateral hydroceles. 3. Normal bilateral testicular flow. Electronically Signed   By: Virgina Norfolk M.D.   On: 11/30/2020 15:41      Subjective: No acute issues or events overnight denies nausea vomiting diarrhea constipation headache fevers or chills   Discharge Exam: Vitals:   12/03/20 0532 12/03/20 1413  BP: 135/75 128/62  Pulse: 73 77  Resp: 19 18  Temp: 97.6 F (36.4 C) 98.1 F (36.7 C)  SpO2: 96% 94%   Vitals:   12/02/20 1356  12/02/20 2058 12/03/20 0532 12/03/20 1413  BP: 122/64 (!) 146/71 135/75 128/62  Pulse: 80 81 73 77  Resp: 20 20 19 18   Temp: 98.9 F (37.2 C) 98.6  F (37 C) 97.6 F (36.4 C) 98.1 F (36.7 C)  TempSrc: Oral Oral Oral Oral  SpO2: 95% 98% 96% 94%  Weight:      Height:        General: Pt is alert, awake, not in acute distress Cardiovascular: RRR, S1/S2 +, no rubs, no gallops Respiratory: CTA bilaterally, no wheezing, no rhonchi Abdominal: Soft, NT, ND, bowel sounds +; Foley draining pink/red urine Extremities: no edema, no cyanosis    The results of significant diagnostics from this hospitalization (including imaging, microbiology, ancillary and laboratory) are listed below for reference.     Microbiology: Recent Results (from the past 240 hour(s))  Urine culture     Status: Abnormal   Collection Time: 11/30/20  9:14 AM   Specimen: Urine, Random  Result Value Ref Range Status   Specimen Description   Final    URINE, RANDOM Performed at [facility], [address] [place], [place] [address]    Special Requests   Final    NONE Performed at [facility], [address] [place], [place] [address]    Culture 20,000 COLONIES/mL ENTEROBACTER AEROGENES (A)  Final   Report Status 12/02/2020 FINAL  Final   Organism ID, Bacteria ENTEROBACTER AEROGENES (A)  Final      Susceptibility   Enterobacter aerogenes - MIC*    CEFAZOLIN >=64 RESISTANT Resistant     CEFEPIME <=0.12 SENSITIVE Sensitive     CEFTRIAXONE 8 RESISTANT Resistant     CIPROFLOXACIN <=0.25 SENSITIVE Sensitive     GENTAMICIN <=1 SENSITIVE Sensitive     IMIPENEM <=0.25 SENSITIVE Sensitive     NITROFURANTOIN 128 RESISTANT Resistant     TRIMETH/SULFA <=20 SENSITIVE Sensitive     PIP/TAZO >=128 RESISTANT Resistant     * 20,000 COLONIES/mL ENTEROBACTER AEROGENES  SARS CORONAVIRUS 2 (TAT 6-24 HRS) Nasopharyngeal Nasopharyngeal Swab     Status: None   Collection Time: 11/30/20  1:55 PM   Specimen: Nasopharyngeal Swab  Result Value Ref Range Status   SARS Coronavirus 2 NEGATIVE NEGATIVE Final    Comment: (NOTE) SARS-CoV-2 target nucleic acids are NOT  DETECTED.  The SARS-CoV-2 RNA is generally detectable in upper and lower respiratory specimens during the acute phase of infection. Negative results do not preclude SARS-CoV-2 infection, do not rule out co-infections with other pathogens, and should not be used as the sole basis for treatment or other patient management decisions. Negative results must be combined with clinical observations, patient history, and epidemiological information. The expected result is Negative.  Fact Sheet for Patients: SugarRoll.be  Fact Sheet for Healthcare Providers: https://www.woods-mathews.com/  This test is not yet approved or cleared by the [place] FDA and  has been authorized for detection and/or diagnosis of SARS-CoV-2 by FDA under an Emergency Use Authorization (EUA). This EUA will remain  in effect (meaning this test can be used) for the duration of the COVID-19 declaration under Se ction 564(b)(1) of the Act, 21 U.S.C. section 360bbb-3(b)(1), unless the authorization is terminated or revoked sooner.  Performed at [facility], [place] [address], [place], [place] [address]   Blood culture (routine x 2)     Status: None (Preliminary result)   Collection Time: 11/30/20  2:26 PM   Specimen: BLOOD  Result Value Ref Range Status   Specimen Description  BLOOD  Final   Special Requests NONE  Final   Culture   Final    NO GROWTH 3 DAYS Performed at [facility], [address], [place], [place] [address]    Report Status PENDING  Incomplete  Blood culture (routine x 2)     Status: None (Preliminary result)   Collection Time: 11/30/20  2:26 PM   Specimen: BLOOD  Result Value Ref Range Status   Specimen Description BLOOD  Final   Special Requests NONE  Final   Culture   Final    NO GROWTH 3 DAYS Performed at [facility], [address], [place], [place] [address]    Report Status PENDING  Incomplete     Labs: BNP (last 3 results) No  results for input(s): BNP in the last 8760 hours. Basic Metabolic Panel: Recent Labs  Lab 11/30/20 0913 11/30/20 1643 11/30/20 1847 12/01/20 0635 12/02/20 0707 12/03/20 0501  NA 130* 132*  --  136 134* 136  K 6.2* 5.1  --  5.2* 4.3 4.1  CL 97* 98  --  103 104 105  CO2 22 24  --  25 22 23   GLUCOSE 215* 152*  --  168* 176* 164*  BUN 63* 59*  --  44* 26* 25*  CREATININE 3.84* 3.51* 3.31* 1.89* 1.13 1.18  CALCIUM 9.1 9.1  --  9.2 8.8* 8.7*  MG  --   --   --  2.1  --   --    Liver Function Tests: Recent Labs  Lab 11/30/20 0913 12/01/20 0635  AST 23 32  ALT 10 17  ALKPHOS 80 92  BILITOT 0.7 0.6  PROT 7.2 6.6  ALBUMIN 3.2* 2.8*   No results for input(s): LIPASE, AMYLASE in the last 168 hours. No results for input(s): AMMONIA in the last 168 hours. CBC: Recent Labs  Lab 11/30/20 0913 11/30/20 1847 12/01/20 0635 12/02/20 0707 12/03/20 0501  WBC 12.8* 10.9* 8.4 8.6 8.0  NEUTROABS 10.6*  --   --   --   --   HGB 11.0* 10.2* 9.6* 10.2* 9.5*  HCT 33.5* 30.7* 30.3* 30.9* 29.4*  MCV 86.1 86.7 88.3 87.3 87.5  PLT 233 204 206 241 229   Cardiac Enzymes: No results for input(s): CKTOTAL, CKMB, CKMBINDEX, TROPONINI in the last 168 hours. BNP: Invalid input(s): POCBNP CBG: Recent Labs  Lab 12/02/20 1610 12/02/20 2101 12/03/20 0303 12/03/20 0740 12/03/20 1106  GLUCAP 179* 178* 161* 150* 224*   D-Dimer No results for input(s): DDIMER in the last 72 hours. Hgb A1c No results for input(s): HGBA1C in the last 72 hours. Lipid Profile No results for input(s): CHOL, HDL, LDLCALC, TRIG, CHOLHDL, LDLDIRECT in the last 72 hours. Thyroid function studies No results for input(s): TSH, T4TOTAL, T3FREE, THYROIDAB in the last 72 hours.  Invalid input(s): FREET3 Anemia work up No results for input(s): VITAMINB12, FOLATE, FERRITIN, TIBC, IRON, RETICCTPCT in the last 72 hours. Urinalysis    Component Value Date/Time   COLORURINE YELLOW 11/30/2020 0914   APPEARANCEUR CLEAR  11/30/2020 0914   APPEARANCEUR Clear 10/15/2020 1127   LABSPEC 1.016 11/30/2020 0914   PHURINE 6.0 11/30/2020 0914   GLUCOSEU NEGATIVE 11/30/2020 0914   HGBUR LARGE (A) 11/30/2020 0914   BILIRUBINUR NEGATIVE 11/30/2020 0914   BILIRUBINUR Negative 10/15/2020 1127   [place] 11/30/2020 0914   PROTEINUR 30 (A) 11/30/2020 0914   UROBILINOGEN negative 12/18/2014 1601   UROBILINOGEN 1.0 04/06/2010 0509   NITRITE NEGATIVE 11/30/2020 0914   LEUKOCYTESUR  MODERATE (A) 11/30/2020 0914   Sepsis Labs Invalid input(s): PROCALCITONIN,  WBC,  LACTICIDVEN Microbiology Recent Results (from the past 240 hour(s))  Urine culture     Status: Abnormal   Collection Time: 11/30/20  9:14 AM   Specimen: Urine, Random  Result Value Ref Range Status   Specimen Description   Final    URINE, RANDOM Performed at [facility], [address] [place], [place] [address]    Special Requests   Final    NONE Performed at [facility], [address] [place], [place] [address]    Culture 20,000 COLONIES/mL ENTEROBACTER AEROGENES (A)  Final   Report Status 12/02/2020 FINAL  Final   Organism ID, Bacteria ENTEROBACTER AEROGENES (A)  Final      Susceptibility   Enterobacter aerogenes - MIC*    CEFAZOLIN >=64 RESISTANT Resistant     CEFEPIME <=0.12 SENSITIVE Sensitive     CEFTRIAXONE 8 RESISTANT Resistant     CIPROFLOXACIN <=0.25 SENSITIVE Sensitive     GENTAMICIN <=1 SENSITIVE Sensitive     IMIPENEM <=0.25 SENSITIVE Sensitive     NITROFURANTOIN 128 RESISTANT Resistant     TRIMETH/SULFA <=20 SENSITIVE Sensitive     PIP/TAZO >=128 RESISTANT Resistant     * 20,000 COLONIES/mL ENTEROBACTER AEROGENES  SARS CORONAVIRUS 2 (TAT 6-24 HRS) Nasopharyngeal Nasopharyngeal Swab     Status: None   Collection Time: 11/30/20  1:55 PM   Specimen: Nasopharyngeal Swab  Result Value Ref Range Status   SARS Coronavirus 2 NEGATIVE NEGATIVE Final    Comment: (NOTE) SARS-CoV-2 target nucleic acids are NOT DETECTED.  The  SARS-CoV-2 RNA is generally detectable in upper and lower respiratory specimens during the acute phase of infection. Negative results do not preclude SARS-CoV-2 infection, do not rule out co-infections with other pathogens, and should not be used as the sole basis for treatment or other patient management decisions. Negative results must be combined with clinical observations, patient history, and epidemiological information. The expected result is Negative.  Fact Sheet for Patients: SugarRoll.be  Fact Sheet for Healthcare Providers: https://www.woods-mathews.com/  This test is not yet approved or cleared by the [place] FDA and  has been authorized for detection and/or diagnosis of SARS-CoV-2 by FDA under an Emergency Use Authorization (EUA). This EUA will remain  in effect (meaning this test can be used) for the duration of the COVID-19 declaration under Se ction 564(b)(1) of the Act, 21 U.S.C. section 360bbb-3(b)(1), unless the authorization is terminated or revoked sooner.  Performed at [facility], [place] [address], [place], [place] [address]   Blood culture (routine x 2)     Status: None (Preliminary result)   Collection Time: 11/30/20  2:26 PM   Specimen: BLOOD  Result Value Ref Range Status   Specimen Description BLOOD  Final   Special Requests NONE  Final   Culture   Final    NO GROWTH 3 DAYS Performed at [facility], [address], [place], [place] [address]    Report Status PENDING  Incomplete  Blood culture (routine x 2)     Status: None (Preliminary result)   Collection Time: 11/30/20  2:26 PM   Specimen: BLOOD  Result Value Ref Range Status   Specimen Description BLOOD  Final   Special Requests NONE  Final   Culture   Final    NO GROWTH 3 DAYS Performed at [facility], [address], [place], [place] [address]    Report Status PENDING  Incomplete  Time coordinating discharge: Over 30  minutes  SIGNED:   Little Ishikawa, DO [facility] 12/03/2020, 3:02 PM Pager   If 7PM-7AM, please contact night-coverage [email or web address]

## 2020-12-03 NOTE — Progress Notes (Signed)
Subjective: Confusion improved. 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 Foley draining clear.   Objective: Vital signs in last 24 hours: Temp:  [97.6 F (36.4 C)-98.9 F (37.2 C)] 97.6 F (36.4 C) (05/20 0532) Pulse Rate:  [73-81] 73 (05/20 0532) Resp:  [19-20] 19 (05/20 0532) BP: (122-146)/(64-75) 135/75 (05/20 0532) SpO2:  [95 %-98 %] 96 % (05/20 0532)  Intake/Output from previous day: 05/19 0701 - 05/20 0700 In: 61 [P.O.:960] Out: 3150 [Urine:3150] Intake/Output this shift: Total I/O In: 595 [P.O.:595] Out: 400 [Urine:400]  Physical Exam:  General:alert, cooperative and appears stated age GI: soft, non tender, normal bowel sounds, no palpable masses, no organomegaly, no inguinal hernia Male genitalia: not done Extremities: extremities normal, atraumatic, no cyanosis or edema  Lab Results: Recent Labs    12/01/20 0635 12/02/20 0707 12/03/20 0501  HGB 9.6* 10.2* 9.5*  HCT 30.3* 30.9* 29.4*   BMET Recent Labs    12/02/20 0707 12/03/20 0501  NA 134* 136  K 4.3 4.1  CL 104 105  CO2 22 23  GLUCOSE 176* 164*  BUN 26* 25*  CREATININE 1.13 1.18  CALCIUM 8.8* 8.7*   No results for input(s): LABPT, INR in the last 72 hours. No results for input(s): LABURIN in the last 72 hours. Results for orders placed or performed during the hospital encounter of 11/30/20  Urine culture     Status: Abnormal   Collection Time: 11/30/20  9:14 AM   Specimen: Urine, Random  Result Value Ref Range Status   Specimen Description   Final    URINE, RANDOM Performed at [facility], [address], [place], [place] [address]    Special Requests   Final    NONE Performed at [facility], [address] [place], [place] [address]    Culture 20,000 COLONIES/mL ENTEROBACTER AEROGENES (A)  Final   Report Status 12/02/2020 FINAL  Final   Organism ID, Bacteria ENTEROBACTER AEROGENES (A)  Final      Susceptibility   Enterobacter aerogenes - MIC*    CEFAZOLIN >=64  RESISTANT Resistant     CEFEPIME <=0.12 SENSITIVE Sensitive     CEFTRIAXONE 8 RESISTANT Resistant     CIPROFLOXACIN <=0.25 SENSITIVE Sensitive     GENTAMICIN <=1 SENSITIVE Sensitive     IMIPENEM <=0.25 SENSITIVE Sensitive     NITROFURANTOIN 128 RESISTANT Resistant     TRIMETH/SULFA <=20 SENSITIVE Sensitive     PIP/TAZO >=128 RESISTANT Resistant     * 20,000 COLONIES/mL ENTEROBACTER AEROGENES  SARS CORONAVIRUS 2 (TAT 6-24 HRS) Nasopharyngeal Nasopharyngeal Swab     Status: None   Collection Time: 11/30/20  1:55 PM   Specimen: Nasopharyngeal Swab  Result Value Ref Range Status   SARS Coronavirus 2 NEGATIVE NEGATIVE Final    Comment: (NOTE) SARS-CoV-2 target nucleic acids are NOT DETECTED.  The SARS-CoV-2 RNA is generally detectable in upper and lower respiratory specimens during the acute phase of infection. Negative results do not preclude SARS-CoV-2 infection, do not rule out co-infections with other pathogens, and should not be used as the sole basis for treatment or other patient management decisions. Negative results must be combined with clinical observations, patient history, and epidemiological information. The expected result is Negative.  Fact Sheet for Patients: SugarRoll.be  Fact Sheet for Healthcare Providers: https://www.woods-mathews.com/  This test is not yet approved or cleared by the [place] FDA and  has been authorized for detection and/or diagnosis of SARS-CoV-2 by FDA under an Emergency Use Authorization (EUA). This EUA will remain  in effect (meaning this test can be used) for the duration of the COVID-19 declaration under Se ction 564(b)(1) of the Act, 21 U.S.C. section 360bbb-3(b)(1), unless the authorization is terminated or revoked sooner.  Performed at [facility], [place] [address] [place], [place] [address]   Blood culture (routine x 2)     Status: None (Preliminary result)   Collection Time:  11/30/20  2:26 PM   Specimen: BLOOD  Result Value Ref Range Status   Specimen Description BLOOD  Final   Special Requests NONE  Final   Culture   Final    NO GROWTH 3 DAYS Performed at [facility], [address] [place], [place] [address]    Report Status PENDING  Incomplete  Blood culture (routine x 2)     Status: None (Preliminary result)   Collection Time: 11/30/20  2:26 PM   Specimen: BLOOD  Result Value Ref Range Status   Specimen Description BLOOD  Final   Special Requests NONE  Final   Culture   Final    NO GROWTH 3 DAYS Performed at [facility], [address] [place], [place] [address]    Report Status PENDING  Incomplete    Studies/Results: No results found.  Assessment/Plan: 69yo with urinoma and left epididymo-orchitis  1. Continue foley catheter to straight drain. He can be discharged with the foley in place and followup in 2 weeks with a cystogram prior to foley removal 2. Epididymo-orchitis: Bactrim DS BID for 14 days   LOS: 3 days   Nicolette Bang 12/03/2020, 12:52 PM

## 2020-12-03 NOTE — TOC Initial Note (Signed)
Transition of Care [facility]) - Initial/Assessment Note    Patient Details  Name: Jacob Fowler MRN: [id number] Date of Birth: 06-07-1951  Transition [facility]) CM/SW Contact:    Iona Beard, [place] Phone Number: 12/03/2020, 9:39 AM  Clinical Narrative:                 CSW attempted call to pts wife, she did not answer. CSW spoke with pts daughter Mitzi to complete pts assessment. Pt lives with his wife. Normally before getting sick pt is able to complete ADLs independently. Pt and his wife both drive when needed. Pt has not had [place] services but has gone to outpatient PT in [facility] previously. Pt has a cane, walker, wheelchair, bsc, and shower chair for pt to use when needed. PT is recommending SNF for pt. CSW spoke with pts family about SNF recommendation, they do not wish for pt to go to SNF they want to take pt home when he is medically stable for D/C. They will be agreeable to [facility], CSW reaching out to local agencies to see who can accept [facility] referral. TOC to follow.   Expected Discharge Plan: [place] Barriers to Discharge: Continued Medical Work up   Patient Goals and CMS Choice Patient states their goals for this hospitalization and ongoing recovery are:: Return home with family CMS Medicare.gov Compare Post Acute Care list provided to:: Patient Represenative (must comment) Choice offered to / list presented to : Patient,Adult Children,Spouse  Expected Discharge Plan and Services Expected Discharge Plan: [place] Choice: [place] arrangements for the past 2 months: Single Family Home                 DME Arranged: N/A DME Agency: NA       HH Arranged: NA HH Agency: NA        Prior Living Arrangements/Services Living arrangements for the past 2 months: [place] Lives with:: Spouse Patient language and need for interpreter reviewed:: Yes Do you feel safe going back to the place where you  live?: Yes      Need for Family Participation in Patient Care: Yes (Comment) Care giver support system in place?: Yes (comment) Current home services: DME Criminal Activity/Legal Involvement Pertinent to Current Situation/Hospitalization: No - Comment as needed  Activities of Daily Living Home Assistive Devices/Equipment: Cane (specify quad or straight) ADL Screening (condition at time of admission) Patient's cognitive ability adequate to safely complete daily activities?: No Is the patient deaf or have difficulty hearing?: No Does the patient have difficulty seeing, even when wearing glasses/contacts?: No Does the patient have difficulty concentrating, remembering, or making decisions?: Yes Patient able to express need for assistance with ADLs?: No Does the patient have difficulty dressing or bathing?: Yes Independently performs ADLs?: No Communication: Independent Dressing (OT): Needs assistance Is this a change from baseline?: Change from baseline, expected to last <3days Grooming: Needs assistance Is this a change from baseline?: Change from baseline, expected to last <3 days Feeding: Independent Bathing: Needs assistance Is this a change from baseline?: Change from baseline, expected to last <3 days Toileting: Needs assistance Is this a change from baseline?: Change from baseline, expected to last <3 days In/Out Bed: Needs assistance Is this a change from baseline?: Change from baseline, expected to last <3 days Walks in Home: Needs assistance Is this a change from baseline?: Change from baseline, expected to last <  3 days Does the patient have difficulty walking or climbing stairs?: Yes Weakness of Legs: Both Weakness of Arms/Hands: None  Permission Sought/Granted                  Emotional Assessment Appearance:: Appears stated age Attitude/Demeanor/Rapport: Engaged Affect (typically observed): Accepting   Alcohol / Substance Use: Not Applicable Psych  Involvement: No (comment)  Admission diagnosis:  Scrotum swelling [N50.89] Generalized weakness [R53.1] AKI (acute kidney injury) ([place]) [N17.9] Community acquired pneumonia of right lower lobe of lung [J18.9] Patient Active Problem List   Diagnosis Date Noted  . AKI (acute kidney injury) ([place]) 11/30/2020  . Epididymitis 11/30/2020  . Postoperative delirium 11/30/2020  . Hyperkalemia 11/30/2020  . Inguinal hernia 11/30/2020  . Abnormal computed tomography of ureter 11/30/2020  . Hydronephrosis 11/30/2020  . Thrombocytosis 11/30/2020  . Scrotum swelling 11/30/2020  . Hyperlipidemia 11/30/2020  . BPH with obstruction/lower urinary tract symptoms 11/30/2020  . Respiratory distress 11/30/2020  . Nephrolithiasis 08/30/2020  . Hypothyroidism 07/28/2020  . Gastroesophageal reflux disease 07/28/2020  . B12 deficiency 07/28/2020  . Elevated PSA 02/26/2020  . Nocturia 02/26/2020  . Multiple falls 02/09/2020  . Memory loss 08/11/2019  . Vitamin D deficiency 04/25/2019  . Irritable bowel syndrome with constipation 04/25/2019  . RBBB 08/14/2018  . Combined forms of age-related cataract of right eye 08/30/2017  . Thrombocytopenia ([place]) 05/18/2017  . Aortic atherosclerosis ([place]) 04/10/2016  . Rosacea 11/23/2015  . Urinary dysfunction 08/19/2015  . Right foot drop 02/17/2015  . Urinary disorder 02/17/2015  . Generalized weakness 02/17/2015  . 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 Bladder disorder 09/16/2014  . Multiple sclerosis ([place]) 01/06/2013  . CORONARY ATHEROSCLEROSIS, NATIVE VESSEL 10/06/2009  . Diabetes mellitus type 2 in nonobese ([place]) 10/01/2009  . Hyperlipidemia associated with type 2 diabetes mellitus ([place]) 10/01/2009  . Hypertension associated with type 2 diabetes mellitus ([place]) 10/01/2009   PCP:  [name], FNP Pharmacy:   [place], [place] [facility] [address] [place] [address] Phone: [phone] Fax:  [phone]     Social Determinants of Health (SDOH) Interventions    Readmission Risk Interventions Readmission Risk Prevention Plan 12/03/2020  Transportation Screening Complete  Home Care Screening Complete  Medication Review (RN CM) Complete  Some recent data might be hidden

## 2020-12-03 NOTE — Progress Notes (Signed)
Physical Therapy Treatment Patient Details Name: Jacob Fowler MRN: [id number] DOB: 1951-02-08 Today's Date: 12/03/2020    History of Present Illness ELHADJ GIRTON is a 70 y.o. male with medical history significant of HTN, HLD, MS, nonobstructive CAD, type II DM, GERD, Hypothyroidism,  L hiatal hernia, chronic R leg pain, BPH, Multiple Sclerosis and nephrolithiasis s/p left ureteroscopic stone extraction performed 11/25/2020 by Dr. Alyson Ingles who presents by his daughter after concerns of bizarre behavior. Since stent placement, pt has become increasingly confused, has had visual hallucinations, and is markedly more unsteady on his feet with generalized weakness. Hallucinations and confusion seem to wax and wane, with pt reporting dots and bugs on the wall. Family thought it might be related to his pain medicine, so they stopped giving it to him but visual hallucinations persisted. Pt's temperature increased to 44F at home so family decided to bring him to ED for evaluation.    PT Comments    Patient demonstrates slow labored movement for sitting up at bedside and unable to move right leg due to weakness, required Mod assist to sit up at bedside, very unsteady on feet and unable to advance RLE when attempting steps, required tactile assistance to shuffle right foot during transfer to chair.  Patient tolerated sitting up in chair after therapy with family members present in room.  Patient will benefit from continued physical therapy in hospital and recommended venue below to increase strength, balance, endurance for safe ADLs and gait.    Follow Up Recommendations  SNF     Equipment Recommendations  None recommended by PT    Recommendations for Other Services       Precautions / Restrictions Precautions Precautions: Fall Restrictions Weight Bearing Restrictions: No    Mobility  Bed Mobility Overal bed mobility: Needs Assistance Bed Mobility: Supine to Sit     Supine to sit: Mod  assist     General bed mobility comments: increased time, unable to move RLE due to weaknes    Transfers Overall transfer level: Needs assistance Equipment used: Rolling walker (2 wheeled) Transfers: Sit to/from [facility] Sit to Stand: Mod assist Stand pivot transfers: Mod assist       General transfer comment: increased time, labored movement, unable to advance RLE without tactile assistance  Ambulation/Gait Ambulation/Gait assistance: Max assist Gait Distance (Feet): 3 Feet Assistive device: Rolling walker (2 wheeled) Gait Pattern/deviations: Decreased step length - right;Decreased step length - left;Decreased stance time - right;Decreased stride length;Antalgic Gait velocity: slow   General Gait Details: limited to 4-5 slow labored unsteady side steps requiring tactile assistance to move RLE due to weakness and poor standing balance   Stairs             Wheelchair Mobility    Modified Rankin (Stroke Patients Only)       Balance Overall balance assessment: Needs assistance Sitting-balance support: Feet supported;No upper extremity supported Sitting balance-Leahy Scale: Fair Sitting balance - Comments: fair/good seated at EOB   Standing balance support: During functional activity;Bilateral upper extremity supported Standing balance-Leahy Scale: Poor Standing balance comment: using RW                            Cognition Arousal/Alertness: Awake/alert Behavior During Therapy: WFL for tasks assessed/performed Overall Cognitive Status: Within Functional Limits for tasks assessed  Exercises      General Comments        Pertinent Vitals/Pain Pain Assessment: No/denies pain    Home Living                      Prior Function            PT Goals (current goals can now be found in the care plan section) Acute Rehab PT Goals Patient Stated Goal: return home  with family to assist PT Goal Formulation: With patient/family Time For Goal Achievement: 12/16/20 Potential to Achieve Goals: Good Progress towards PT goals: Progressing toward goals    Frequency    Min 3X/week      PT Plan Current plan remains appropriate    Co-evaluation              AM-PAC PT "6 Clicks" Mobility   Outcome Measure  Help needed turning from your back to your side while in a flat bed without using bedrails?: A Lot Help needed moving from lying on your back to sitting on the side of a flat bed without using bedrails?: A Lot Help needed moving to and from a bed to a chair (including a wheelchair)?: A Lot Help needed standing up from a chair using your arms (e.g., wheelchair or bedside chair)?: A Lot Help needed to walk in hospital room?: A Lot Help needed climbing 3-5 steps with a railing? : Total 6 Click Score: 11    End of Session   Activity Tolerance: Patient tolerated treatment well;Patient limited by fatigue Patient left: in chair;with call bell/phone within reach;with chair alarm set;with family/visitor present Nurse Communication: Mobility status PT Visit Diagnosis: Unsteadiness on feet (R26.81);Other abnormalities of gait and mobility (R26.89);Muscle weakness (generalized) (M62.81)     Time: 8882-8003 PT Time Calculation (min) (ACUTE ONLY): 23 min  Charges:  $Therapeutic Activity: 23-37 mins                     3:44 PM, 12/03/20 Lonell Grandchild, MPT Physical Therapist with [facility] [phone] office [phone] mobile phone

## 2020-12-05 LAB — CULTURE, BLOOD (ROUTINE X 2)
Culture: NO GROWTH
Culture: NO GROWTH
Special Requests: ADEQUATE
Special Requests: ADEQUATE

## 2020-12-06 ENCOUNTER — Telehealth: Payer: Self-pay

## 2020-12-06 NOTE — Telephone Encounter (Signed)
Transition Care Management Follow-up Telephone Call  Date of discharge and from where: 12/03/20 from [facility]  How have you been since you were released from the hospital? Feeling a lot better, but has a long way to go  Any questions or concerns? No -But he doesn't want to come here for f/u -doesn't feel it necessary  Items Reviewed:  Did the pt receive and understand the discharge instructions provided? Yes   Medications obtained and verified? Yes   Other? No   Any new allergies since your discharge? No   Dietary orders reviewed? Yes  Do you have support at home? Yes   Home Care and Equipment/Supplies: Were home health services ordered? No  Functional Questionnaire: (I = Independent and D = Dependent) ADLs: I  Bathing/Dressing- I  Meal Prep- I  Eating- I  Maintaining continence- I  Transferring/Ambulation- I  Managing Meds- I  Follow up appointments reviewed:   PCP Hospital f/u appt confirmed? No  hospital discharge recommended he f/u with PCP in 1-2 weeks, but he declines appt since he has to have procedure at hospital and then see Urology 5/31 - Says Dr Alyson Ingles will do labs and everything he needs  [facility] f/u appt confirmed? Yes  Scheduled to see [facility] on 5/31 @ 1 (procedure at hospital at 11 first).  Are transportation arrangements needed? No   If their condition worsens, is the pt aware to call PCP or go to the Emergency Dept.? Yes  Was the patient provided with contact information for the PCP's office or ED? Yes  Was to pt encouraged to call back with questions or concerns? Yes

## 2020-12-14 ENCOUNTER — Encounter: Payer: Self-pay | Admitting: Urology

## 2020-12-14 ENCOUNTER — Ambulatory Visit: Payer: PPO | Admitting: Urology

## 2020-12-14 ENCOUNTER — Ambulatory Visit (HOSPITAL_COMMUNITY)
Admission: RE | Admit: 2020-12-14 | Discharge: 2020-12-14 | Disposition: A | Discharge status: Home / Self Care | Payer: PPO | Source: Ambulatory Visit | Attending: Urology | Admitting: Urology

## 2020-12-14 ENCOUNTER — Other Ambulatory Visit: Payer: Self-pay

## 2020-12-14 DIAGNOSIS — R390 Extravasation of urine: Secondary | ICD-10-CM | POA: Diagnosis not present

## 2020-12-14 DIAGNOSIS — N200 Calculus of kidney: Secondary | ICD-10-CM

## 2020-12-14 DIAGNOSIS — N320 Bladder-neck obstruction: Secondary | ICD-10-CM | POA: Diagnosis not present

## 2020-12-14 MED ORDER — IOTHALAMATE MEGLUMINE 17.2 % UR SOLN
250.0000 mL | Freq: Once | URETHRAL | Status: AC | PRN
  Administered 2020-12-14: 150 mL via INTRAVESICAL

## 2020-12-14 MED ORDER — CLOTRIMAZOLE-BETAMETHASONE 1-0.05 % EX CREA: 1.0000 "application " | TOPICAL_CREAM | Freq: Two times a day (BID) | CUTANEOUS | 2 refills | Status: DC

## 2020-12-14 NOTE — Progress Notes (Signed)
12/14/2020 1:02 PM   Jacob Fowler [birth year] [id number]  Referring provider: [name], [place],  [place] [address]  followup urinoma  HPI: Jacob Fowler is a 70yo here for followup for a urinoma. He developed urinary retention and then a urinoma after left ureteroscopic stone extraction. He had foley placed while hospitalized and his creatinine normalized. Urinoma decreased on followup CT. Cystogram from today shows no ureteral or renal extravasation of contrast.    PMH: Past Medical History:  Diagnosis Date  . Acquired leg length discrepancy   . Bladder stone   . BPH associated with nocturia   . CAD (coronary artery disease) CARDIOLOGIST- DR HOCHREIN--  LAST VISIT 09-27-2011 IN [facility]   Non obstructive Cath 2010-  HX CORONARY SPASM 2004  . Cataract    bilateral   . GERD (gastroesophageal reflux disease)   . H/O hiatal hernia   . Heart murmur   . Heartburn   . High cholesterol   . History of nephrolithiasis 2009  . Hx of adenomatous colonic polyps   . Hyperlipidemia   . Hypertension   . Movement disorder   . Multiple sclerosis ([place]) DX  10/10---  NEUROLOGIST  DR Delfino Lovett [facility] ([place])   RIGHT SIDE AFFECTED MORE W/ WEAKNESS- - USES CANE  . Neuromuscular disorder (H[facility])    MS  . NIDDM (non-insulin dependent diabetes mellitus)   . Peyronie's disease   . RBBB   . Renal stone RIGHT  . Rosacea   . Vision abnormalities     Surgical History: Past Surgical History:  Procedure Laterality Date  . CARDIAC CATHETERIZATION  02-26-2009 ---  DR Ida Rogue   NONOBSTRUCTIVE CAD/ 50% DISTAL RCA/ 30% LAD / NORMAL LVF  . CYSTOSCOPY W/ RETROGRADES  10/30/2011   Procedure: CYSTOSCOPY WITH RETROGRADE PYELOGRAM;  Surgeon: Claybon Jabs, MD;  Location[facility];  Service: Urology;  Laterality: Right;  . CYSTOSCOPY WITH LITHOLAPAXY  10/30/2011   Procedure: CYSTOSCOPY WITH LITHOLAPAXY;  Surgeon: Claybon Jabs, MD;  Location: [facility];  Service: Urology;  Laterality: N/A;  . CYSTOSCOPY WITH RETROGRADE PYELOGRAM, URETEROSCOPY AND STENT PLACEMENT Left 11/25/2020   Procedure: CYSTOSCOPY WITH LEFT RETROGRADE PYELOGRAM, LEFT URETEROSCOPY WITH LASER AND LEFT URETERAL STENT PLACEMENT;  Surgeon: Cleon Gustin, MD;  Location: [facility];  Service: Urology;  Laterality: Left;  . EXTRACORPOREAL SHOCK WAVE LITHOTRIPSY  09-05-2010   RIGHT  . EYE SURGERY Right 08/30/2018  . HOLMIUM LASER APPLICATION Left 03/11/4157   Procedure: HOLMIUM LASER APPLICATION;  Surgeon: Cleon Gustin, MD;  Location: [facility];  Service: Urology;  Laterality: Left;  . NESBIT PROCEDURE  10-17-2004   CORRECTION OF PENILE ANGULATION (PEYRONIES DISEASE)    Home Medications:  Allergies as of 12/14/2020   No Known Allergies     Medication List       Accurate as of Dec 14, 2020  1:02 PM. If you have any questions, ask your nurse or doctor.        alfuzosin 10 MG 24 hr tablet Commonly known as: UROXATRAL Take 1 tablet (10 mg total) by mouth in the morning and at bedtime.   amLODipine 2.5 MG tablet Commonly known as: NORVASC Take 1 tablet (2.5 mg total) by mouth daily.   aspirin 81 MG EC tablet Take 81 mg by mouth daily.   Betaseron 0.3 MG Kit injection Generic drug: Interferon Beta-1b Inject 0.25 mg into the skin every other day. What changed:  how much to take  when to take this   cholecalciferol 1000 units tablet Commonly known as: VITAMIN D Take 1,000 Units by mouth daily.   cyanocobalamin 1000 MCG/ML injection Commonly known as: (VITAMIN B-12) INJECT 1ML INTO THE SKIN ONCE MONTH What changed:   how much to take  how to take this  when to take this  additional instructions   fish oil-omega-3 fatty acids 1000 MG capsule Take 1 g by mouth at bedtime.   glucose blood test strip Use as instructed   isosorbide mononitrate 60 MG 24 hr tablet Commonly known as: IMDUR Take 0.5 tablets (30 mg total) by  mouth daily.   levothyroxine 50 MCG tablet Commonly known as: SYNTHROID Take 1 tablet (50 mcg total) by mouth daily before breakfast.   lisinopril 10 MG tablet Commonly known as: ZESTRIL Take 1 tablet (10 mg total) by mouth daily.   metFORMIN 1000 MG tablet Commonly known as: GLUCOPHAGE Take 1 tablet (1,000 mg total) by mouth 2 (two) times daily with a meal.   omeprazole 20 MG capsule Commonly known as: PRILOSEC Take 1 capsule (20 mg total) by mouth daily.   pioglitazone 30 MG tablet Commonly known as: ACTOS Take 1 tablet (30 mg total) by mouth daily.   rosuvastatin 10 MG tablet Commonly known as: Crestor Take 1 tablet (10 mg total) by mouth at bedtime.   sulfamethoxazole-trimethoprim 800-160 MG tablet Commonly known as: BACTRIM DS Take 1 tablet by mouth every 12 (twelve) hours.   SYRINGE-NEEDLE (DISP) 3 ML 25G X 5/8" 3 ML Misc Commonly known as: B-D SYRINGE/NEEDLE 3CC/25GX5/8 Use to give once a month B12 injections   tamsulosin 0.4 MG Caps capsule Commonly known as: FLOMAX Take 1 capsule (0.4 mg total) by mouth daily after supper.       Allergies: No Known Allergies  Family History: Family History  Problem Relation Age of Onset  . Stroke Mother   . Heart attack Mother   . Diabetes Mother   . Hypertension Mother   . Renal cancer Father   . Diabetes Sister   . Heart attack Brother   . Diabetes Son   . Heart attack Maternal Grandfather   . Heart attack Brother   . Bladder Cancer Brother     Social History:  reports that he quit smoking about 47 years ago. His smoking use included cigarettes. He has a 7.00 pack-year smoking history. His smokeless tobacco use includes chew. He reports that he does not drink alcohol and does not use drugs.  ROS: All other review of systems were reviewed and are negative except what is noted above in HPI  Physical Exam: There were no vitals taken for this visit.  Constitutional:  Alert and oriented, No acute  distress. HEENT: [place] AT, moist mucus membranes.  Trachea midline, no masses. Cardiovascular: No clubbing, cyanosis, or edema. Respiratory: Normal respiratory effort, no increased work of breathing. GI: Abdomen is soft, nontender, nondistended, no abdominal masses GU: No CVA tenderness.  Lymph: No cervical or inguinal lymphadenopathy. Skin: No rashes, bruises or suspicious lesions. Neurologic: Grossly intact, no focal deficits, moving all 4 extremities. Psychiatric: Normal mood and affect.  Laboratory Data: Lab Results  Component Value Date   WBC 8.0 12/03/2020   HGB 9.5 (L) 12/03/2020   HCT 29.4 (L) 12/03/2020   MCV 87.5 12/03/2020   PLT 229 12/03/2020    Lab Results  Component Value Date   CREATININE 1.18 12/03/2020    Lab Results  Component Value Date  PSA 5.5 (H) 01/28/2014   PSA 3.37 01/06/2013   PSA 3.08 10/14/2012    No results found for: TESTOSTERONE  Lab Results  Component Value Date   HGBA1C 6.9 (H) 11/23/2020    Urinalysis    Component Value Date/Time   COLORURINE YELLOW 11/30/2020 0914   APPEARANCEUR CLEAR 11/30/2020 0914   APPEARANCEUR Clear 10/15/2020 1127   LABSPEC 1.016 11/30/2020 0914   PHURINE 6.0 11/30/2020 0914   GLUCOSEU NEGATIVE 11/30/2020 0914   HGBUR LARGE (A) 11/30/2020 0914   BILIRUBINUR NEGATIVE 11/30/2020 0914   BILIRUBINUR Negative 10/15/2020 1127   KETONESUR NEGATIVE 11/30/2020 0914   PROTEINUR 30 (A) 11/30/2020 0914   UROBILINOGEN negative 12/18/2014 1601   UROBILINOGEN 1.0 04/06/2010 0509   NITRITE NEGATIVE 11/30/2020 0914   LEUKOCYTESUR MODERATE (A) 11/30/2020 0914    Lab Results  Component Value Date   LABMICR See below: 10/15/2020   WBCUA 0-5 10/15/2020   RBCUA >30 (A) 08/07/2018   LABEPIT 0-10 10/15/2020   MUCUS Present 10/15/2020   BACTERIA RARE (A) 11/30/2020    Pertinent Imaging: Cystogram: Images reviewed and discussed with the patient and family  Results for orders placed during the hospital encounter of  10/30/11  KUB day of procedure  Narrative *RADIOLOGY REPORT*  Clinical Data: Right ureteral stone  ABDOMEN - 1 VIEW  Comparison: [facility] CT urogram dated 10/17/2011  Findings: Prior distal right ureteral calculus is not definitely visualized on the current radiograph.  Known nonobstructing right lower pole calculi are also not radiographically apparent.  Calcified phleboliths in the pelvis.  Linear calcification overlying the right sacrum corresponds to a vascular calcification.  Suspected radiopaque ingested tablets in the splenic flexure, sigmoid colon (overlying superior bladder), and cecum.  Degenerative changes of the visualized thoracolumbar spine.  IMPRESSION: Prior distal right ureteral calculus is not definitely visualized on the current radiograph.  Known nonobstructing right lower pole calculi are also not radiographically apparent.  Original Report Authenticated By: Julian Hy, M.D.  No results found for this or any previous visit.  No results found for this or any previous visit.  No results found for this or any previous visit.  Results for orders placed in visit on 08/30/20  Ultrasound renal complete  Narrative CLINICAL DATA:  History of nephrolithiasis. History of right flank pain. History of lithotripsy.  EXAM: RENAL / URINARY TRACT ULTRASOUND COMPLETE  COMPARISON:  CT 03/27/2010.  FINDINGS: Right Kidney:  Renal measurements: 10.3 x 5.5 x 5.0 cm = volume: 146.5 mL. Echogenicity within normal limits. No mass or hydronephrosis visualized. Multiple nonobstructing stones, the largest measuring 1.3 cm.  Left Kidney:  Renal measurements: 11.4 x 5.0 x 5.4 cm = volume: 161.6 mL. Echogenicity within normal limits. No mass or hydronephrosis visualized. Multiple nonobstructing stones, the largest measures 1.6 cm.  Bladder:  No bladder distention. Mild bladder wall thickening noted. Active bladder pathology including cystitis  cannot be excluded. Bilateral ureteral jets noted.  Other:  None.  IMPRESSION: 1.  Bilateral nonobstructing nephrolithiasis.  No hydronephrosis.  2. Mild bladder wall thickening noted. Active bladder pathology including cystitis cannot be excluded.   Electronically Signed By: Marcello Moores  Register On: 10/14/2020 07:54  No results found for this or any previous visit.  No results found for this or any previous visit.  Results for orders placed during the hospital encounter of 11/30/20  CT RENAL STONE STUDY  Narrative CLINICAL DATA:  70 year old male with history of abdominal pain with urinary extravasation on prior study. Follow-up examination.  EXAM:  CT ABDOMEN AND PELVIS WITHOUT CONTRAST  TECHNIQUE: Multidetector CT imaging of the abdomen and pelvis was performed following the standard protocol without IV contrast.  COMPARISON:  CT the abdomen and pelvis 11/30/2020.  FINDINGS: Lower chest: Linear areas of scarring are noted in the lung bases bilaterally. Elevation of the right hemidiaphragm. Atherosclerotic calcifications in the descending thoracic aorta as well as the left main, left anterior descending, left circumflex and right coronary arteries. Faint calcifications of the aortic valve.  Hepatobiliary: Diffuse low attenuation throughout the visualized hepatic parenchyma, indicative of hepatic steatosis. No discrete cystic or solid hepatic lesions are confidently identified on today's noncontrast CT examination. Calcified granuloma in segment 4B of the liver incidentally noted. Unenhanced appearance of the gallbladder is normal.  Pancreas: No definite pancreatic mass or peripancreatic fluid collections or inflammatory changes are noted on today's noncontrast CT examination.  Spleen: Unremarkable.  Adrenals/Urinary Tract: Again noted is a left-sided double-J ureteral stent, with the proximal loop reformed in the left renal pelvis and distal loop reformed in  the urinary bladder. There continues to be some abnormal fluid around the left ureter and in the retroperitoneum, although this has significantly decreased compared to the prior study. The largest portion of this collection currently measures approximately 4.9 x 2.3 cm (axial image 67 of series 2) as compared with 6.3 x 4.3 cm on prior study 11/30/2020. Multiple nonobstructive calculi are noted within the collecting systems of both kidneys measuring up to 6 mm in the lower pole collecting system of the right kidney and 6 mm in the interpolar collecting system of the left kidney. Urinary bladder is nearly completely decompressed around an indwelling Foley balloon catheter. Small amount of gas non dependently in the urinary bladder is presumably iatrogenic. Bilateral adrenal glands are normal in appearance.  Stomach/Bowel: Unenhanced appearance of the stomach is normal. No pathologic dilatation of small bowel or colon. Normal appendix.  Vascular/Lymphatic: Aortic atherosclerosis. No lymphadenopathy noted in the abdomen or pelvis.  Reproductive: Prostate gland and seminal vesicles are unremarkable in appearance.  Other: No significant volume of ascites.  No pneumoperitoneum.  Musculoskeletal: There are no aggressive appearing lytic or blastic lesions noted in the visualized portions of the skeleton.  IMPRESSION: 1. Left-sided double-J ureteral stent appears appropriately located. The extent of left perinephric and periureteric fluid has significantly decreased compared to the prior examination, as above. 2. Hepatic steatosis. 3. Aortic atherosclerosis, in addition to left main and 3 vessel coronary artery disease. Assessment for potential risk factor modification, dietary therapy or pharmacologic therapy may be warranted, if clinically indicated. 4. There are calcifications of the aortic valve. Echocardiographic correlation for evaluation of potential valvular dysfunction may  be warranted if clinically indicated. 5. Additional incidental findings, as above.   Electronically Signed By: Vinnie Langton M.D. On: 12/03/2020 14:01   Assessment & Plan:    1.  Extravasation of urine -Voiding trial passed today. Continue uroxatral 21m BID -RTC 4 weeks with CT stone study. If the stone study shows no leak then we will remove his stent   No follow-ups on file.  PNicolette Bang MD  CProvidence Valdez Medical CenterUrology RE[place]

## 2020-12-14 NOTE — Progress Notes (Signed)
Urological Symptom Review  Fill and Pull Catheter Removal  Patient is present today for a catheter removal.  Patient was cleaned and prepped in a sterile fashion 173ml of sterile water/ saline was instilled into the bladder when the patient felt the urge to urinate. 143ml of water was then drained from the balloon.  A 18FR foley cath was removed from the bladder no complications were noted .  Patient as then given some time to void on their own.  Patient can void  136ml on their own after some time.  Patient tolerated well.  Performed by: Estill Bamberg RN  Follow up/ Additional notes: MD to see  Patient is experiencing the following symptoms: negative   Review of Systems  Gastrointestinal (upper)  : Negative for upper GI symptoms  Gastrointestinal (lower) : Negative for lower GI symptoms  Constitutional : Negative for symptoms  Skin: Negative for skin symptoms  Eyes: Negative for eye symptoms  Ear/Nose/Throat : Negative for Ear/Nose/Throat symptoms  Hematologic/Lymphatic: Negative for Hematologic/Lymphatic symptoms  Cardiovascular : Negative for cardiovascular symptoms  Respiratory : Negative for respiratory symptoms  Endocrine: Negative for endocrine symptoms  Musculoskeletal: Negative for musculoskeletal symptoms  Neurological: Negative for neurological symptoms  Psychologic: Negative for psychiatric symptoms

## 2020-12-14 NOTE — Patient Instructions (Signed)

## 2020-12-29 ENCOUNTER — Other Ambulatory Visit: Payer: Self-pay

## 2020-12-29 ENCOUNTER — Telehealth: Payer: Self-pay

## 2020-12-29 ENCOUNTER — Other Ambulatory Visit: Payer: PPO

## 2020-12-29 DIAGNOSIS — N200 Calculus of kidney: Secondary | ICD-10-CM | POA: Diagnosis not present

## 2020-12-29 MED ORDER — NITROFURANTOIN MONOHYD MACRO 100 MG PO CAPS: 100.0000 mg | ORAL_CAPSULE | Freq: Two times a day (BID) | ORAL | 0 refills | Status: DC

## 2020-12-29 NOTE — Telephone Encounter (Signed)
Patient started on Macrobid per Dr. Alyson Ingles. Patient's wife called and made aware.

## 2020-12-30 LAB — URINALYSIS, ROUTINE W REFLEX MICROSCOPIC
Bilirubin, UA: NEGATIVE
Glucose, UA: NEGATIVE
Ketones, UA: NEGATIVE
Nitrite, UA: NEGATIVE
Specific Gravity, UA: 1.015 (ref 1.005–1.030)
Urobilinogen, Ur: 0.2 mg/dL (ref 0.2–1.0)
pH, UA: 6 (ref 5.0–7.5)

## 2020-12-30 LAB — MICROSCOPIC EXAMINATION: WBC, UA: >30 /hpf — AB (ref 0–5)

## 2020-12-31 LAB — URINE CULTURE

## 2021-01-03 NOTE — Progress Notes (Signed)
Sent via my chart and my via mail.

## 2021-01-13 ENCOUNTER — Other Ambulatory Visit: Payer: Self-pay

## 2021-01-13 ENCOUNTER — Ambulatory Visit (HOSPITAL_COMMUNITY)
Admission: RE | Admit: 2021-01-13 | Discharge: 2021-01-13 | Disposition: A | Discharge status: Home / Self Care | Payer: PPO | Source: Ambulatory Visit | Attending: Urology | Admitting: Urology

## 2021-01-13 DIAGNOSIS — N320 Bladder-neck obstruction: Secondary | ICD-10-CM | POA: Diagnosis not present

## 2021-01-13 DIAGNOSIS — N200 Calculus of kidney: Secondary | ICD-10-CM | POA: Insufficient documentation

## 2021-01-13 DIAGNOSIS — N400 Enlarged prostate without lower urinary tract symptoms: Secondary | ICD-10-CM | POA: Diagnosis not present

## 2021-01-13 DIAGNOSIS — K449 Diaphragmatic hernia without obstruction or gangrene: Secondary | ICD-10-CM | POA: Insufficient documentation

## 2021-01-13 DIAGNOSIS — I700 Atherosclerosis of aorta: Secondary | ICD-10-CM | POA: Insufficient documentation

## 2021-01-13 DIAGNOSIS — R390 Extravasation of urine: Secondary | ICD-10-CM | POA: Diagnosis not present

## 2021-01-14 ENCOUNTER — Encounter: Payer: Self-pay | Admitting: Urology

## 2021-01-14 ENCOUNTER — Ambulatory Visit (INDEPENDENT_AMBULATORY_CARE_PROVIDER_SITE_OTHER): Payer: PPO | Admitting: Urology

## 2021-01-14 VITALS — BP 86/47 | HR 86

## 2021-01-14 DIAGNOSIS — N200 Calculus of kidney: Secondary | ICD-10-CM | POA: Diagnosis not present

## 2021-01-14 LAB — MICROSCOPIC EXAMINATION
Epithelial Cells (non renal): NONE SEEN /hpf (ref 0–10)
Renal Epithel, UA: NONE SEEN /hpf
WBC, UA: >30 /hpf — AB (ref 0–5)

## 2021-01-14 LAB — URINALYSIS, ROUTINE W REFLEX MICROSCOPIC
Bilirubin, UA: NEGATIVE
Glucose, UA: NEGATIVE
Nitrite, UA: NEGATIVE
Specific Gravity, UA: 1.025 (ref 1.005–1.030)
Urobilinogen, Ur: 0.2 mg/dL (ref 0.2–1.0)
pH, UA: 5.5 (ref 5.0–7.5)

## 2021-01-14 MED ORDER — CIPROFLOXACIN HCL 500 MG PO TABS
500.0000 mg | ORAL_TABLET | Freq: Once | ORAL | Status: AC
  Administered 2021-01-14: 500 mg via ORAL

## 2021-01-14 NOTE — Progress Notes (Signed)

## 2021-01-14 NOTE — Progress Notes (Signed)
   01/14/21  CC: followup nephrolithiasis  HPI: Jacob Fowler is a 70yo here for left ureteral stent removal. CT stone study showed resolution of the left retroperitoneal urinoma Blood pressure (!) 86/47, pulse 86. NED. A&Ox3.   No respiratory distress   Abd soft, NT, ND Normal phallus with bilateral descended testicles  Cystoscopy Procedure Note  Patient identification was confirmed, informed consent was obtained, and patient was prepped using Betadine solution.  Lidocaine jelly was administered per urethral meatus.     Pre-Procedure: - Inspection reveals a normal caliber ureteral meatus.  Procedure: The flexible cystoscope was introduced without difficulty - No urethral strictures/lesions are present. - Enlarged prostate  - Normal bladder neck - Bilateral ureteral orifices identified - Bladder mucosa  reveals no ulcers, tumors, or lesions - No bladder stones - No trabeculation  Using a grasper the left ureteral stent was removed intact   Post-Procedure: - Patient tolerated the procedure well  Assessment/ Plan: RTC 6 weeks with a renal US  No follow-ups on file.  Nicolette Bang, MD

## 2021-01-14 NOTE — Patient Instructions (Signed)
Textbook of Natural Medicine (5th ed., pp. 1518-1527.e3). [place], [place]: Elsevier.">  Dietary Guidelines to Help Prevent Kidney Stones Kidney stones are deposits of minerals and salts that form inside your kidneys. Your risk of developing kidney stones may be greater depending on your diet, your lifestyle, the medicines you take, and whether you have certain medical conditions. Most people can lower their chances of developing kidney stones by following the instructions below. Your dietitian may give you more specific instructions depending on your overall health and the type of kidney stones youtend to develop. What are tips for following this plan? Reading food labels  Choose foods with "no salt added" or "low-salt" labels. Limit your salt (sodium) intake to less than 1,500 mg a day. Choose foods with calcium for each meal and snack. Try to eat about 300 mg of calcium at each meal. Foods that contain 200-500 mg of calcium a serving include: 8 oz (237 mL) of milk, calcium-fortifiednon-dairy milk, and calcium-fortifiedfruit juice. Calcium-fortified means that calcium has been added to these drinks. 8 oz (237 mL) of kefir, yogurt, and soy yogurt. 4 oz (114 g) of tofu. 1 oz (28 g) of cheese. 1 cup (150 g) of dried figs. 1 cup (91 g) of cooked broccoli. One 3 oz (85 g) can of sardines or mackerel. Most people need 1,000-1,500 mg of calcium a day. Talk to your dietitian abouthow much calcium is recommended for you. Shopping Buy plenty of fresh fruits and vegetables. Most people do not need to avoid fruits and vegetables, even if these foods contain nutrients that may contribute to kidney stones. When shopping for convenience foods, choose: Whole pieces of fruit. Pre-made salads with dressing on the side. Low-fat fruit and yogurt smoothies. Avoid buying frozen meals or prepared deli foods. These can be high in sodium. Look for foods with live cultures, such as yogurt and kefir. Choose high-fiber  grains, such as whole-wheat breads, oat bran, and wheat cereals. Cooking Do not add salt to food when cooking. Place a salt shaker on the table and allow each person to add his or her own salt to taste. Use vegetable protein, such as beans, textured vegetable protein (TVP), or tofu, instead of meat in pasta, casseroles, and soups. Meal planning Eat less salt, if told by your dietitian. To do this: Avoid eating processed or pre-made food. Avoid eating fast food. Eat less animal protein, including cheese, meat, poultry, or fish, if told by your dietitian. To do this: Limit the number of times you have meat, poultry, fish, or cheese each week. Eat a diet free of meat at least 2 days a week. Eat only one serving each day of meat, poultry, fish, or seafood. When you prepare animal protein, cut pieces into small portion sizes. For most meat and fish, one serving is about the size of the palm of your hand. Eat at least five servings of fresh fruits and vegetables each day. To do this: Keep fruits and vegetables on hand for snacks. Eat one piece of fruit or a handful of berries with breakfast. Have a salad and fruit at lunch. Have two kinds of vegetables at dinner. Limit foods that are high in a substance called oxalate. These include: Spinach (cooked), rhubarb, beets, sweet potatoes, and Swiss chard. Peanuts. Potato chips, french fries, and baked potatoes with skin on. Nuts and nut products. Chocolate. If you regularly take a diuretic medicine, make sure to eat at least 1 or 2 servings of fruits or vegetables that are   high in potassium each day. These include: Avocado. Banana. Orange, prune, carrot, or tomato juice. Baked potato. Cabbage. Beans and split peas. Lifestyle  Drink enough fluid to keep your urine pale yellow. This is the most important thing you can do. Spread your fluid intake throughout the day. If you drink alcohol: Limit how much you use to: 0-1 drink a day for women who  are not pregnant. 0-2 drinks a day for men. Be aware of how much alcohol is in your drink. In the [place] one drink equals one 12 oz bottle of beer (355 mL), one 5 oz glass of wine (148 mL), or one 1 oz glass of hard liquor (44 mL). Lose weight if told by your health care provider. Work with your dietitian to find an eating plan and weight loss strategies that work best for you.  General information Talk to your health care provider and dietitian about taking daily supplements. You may be told the following depending on your health and the cause of your kidney stones: Not to take supplements with vitamin C. To take a calcium supplement. To take a daily probiotic supplement. To take other supplements such as magnesium, fish oil, or vitamin B6. Take over-the-counter and prescription medicines only as told by your health care provider. These include supplements. What foods should I limit? Limit your intake of the following foods, or eat them as told by your dietitian. Vegetables Spinach. Rhubarb. Beets. Canned vegetables. Pickles. Olives. Baked potatoeswith skin. Grains Wheat bran. Baked goods. Salted crackers. Cereals high in sugar. Meats and other proteins Nuts. Nut butters. Large portions of meat, poultry, or fish. Salted, precooked,or cured meats, such as sausages, meat loaves, and hot dogs. Dairy Cheese. Beverages Regular soft drinks. Regular vegetable juice. Seasonings and condiments Seasoning blends with salt. Salad dressings. Soy sauce. Ketchup. Barbecue sauce. Other foods Canned soups. Canned pasta sauce. Casseroles. Pizza. Lasagna. Frozen meals.Potato chips. French fries. The items listed above may not be a complete list of foods and beverages you should limit. Contact a dietitian for more information. What foods should I avoid? Talk to your dietitian about specific foods you should avoid based on the typeof kidney stones you have and your overall health. Fruits Grapefruit. The  item listed above may not be a complete list of foods and beverages you should avoid. Contact a dietitian for more information. Summary Kidney stones are deposits of minerals and salts that form inside your kidneys. You can lower your risk of kidney stones by making changes to your diet. The most important thing you can do is drink enough fluid. Drink enough fluid to keep your urine pale yellow. Talk to your dietitian about how much calcium you should have each day, and eat less salt and animal protein as told by your dietitian. This information is not intended to replace advice given to you by your health care provider. Make sure you discuss any questions you have with your healthcare provider. Document Revised: 06/26/2019 Document Reviewed: 06/26/2019 Elsevier Patient Education  2022 [employer]  

## 2021-01-20 ENCOUNTER — Other Ambulatory Visit: Payer: Self-pay | Admitting: Family Medicine

## 2021-01-20 DIAGNOSIS — E119 Type 2 diabetes mellitus without complications: Secondary | ICD-10-CM

## 2021-01-20 DIAGNOSIS — E785 Hyperlipidemia, unspecified: Secondary | ICD-10-CM

## 2021-01-20 DIAGNOSIS — E1169 Type 2 diabetes mellitus with other specified complication: Secondary | ICD-10-CM

## 2021-01-25 ENCOUNTER — Other Ambulatory Visit: Payer: Self-pay

## 2021-01-25 ENCOUNTER — Encounter: Payer: Self-pay | Admitting: Family Medicine

## 2021-01-25 ENCOUNTER — Ambulatory Visit (INDEPENDENT_AMBULATORY_CARE_PROVIDER_SITE_OTHER): Payer: PPO | Admitting: Family Medicine

## 2021-01-25 VITALS — BP 93/59 | HR 81 | Temp 97.9°F | Ht 66.0 in | Wt 154.8 lb

## 2021-01-25 DIAGNOSIS — K219 Gastro-esophageal reflux disease without esophagitis: Secondary | ICD-10-CM | POA: Diagnosis not present

## 2021-01-25 DIAGNOSIS — E119 Type 2 diabetes mellitus without complications: Secondary | ICD-10-CM

## 2021-01-25 DIAGNOSIS — E039 Hypothyroidism, unspecified: Secondary | ICD-10-CM

## 2021-01-25 DIAGNOSIS — E785 Hyperlipidemia, unspecified: Secondary | ICD-10-CM | POA: Diagnosis not present

## 2021-01-25 DIAGNOSIS — I700 Atherosclerosis of aorta: Secondary | ICD-10-CM | POA: Diagnosis not present

## 2021-01-25 DIAGNOSIS — E1169 Type 2 diabetes mellitus with other specified complication: Secondary | ICD-10-CM | POA: Diagnosis not present

## 2021-01-25 DIAGNOSIS — R600 Localized edema: Secondary | ICD-10-CM

## 2021-01-25 DIAGNOSIS — E1159 Type 2 diabetes mellitus with other circulatory complications: Secondary | ICD-10-CM

## 2021-01-25 DIAGNOSIS — I152 Hypertension secondary to endocrine disorders: Secondary | ICD-10-CM

## 2021-01-25 DIAGNOSIS — R39198 Other difficulties with micturition: Secondary | ICD-10-CM

## 2021-01-25 MED ORDER — PIOGLITAZONE HCL 30 MG PO TABS
30.0000 mg | ORAL_TABLET | Freq: Every day | ORAL | 1 refills | Status: DC
Start: 2021-01-25 — End: 2021-08-15

## 2021-01-25 MED ORDER — METFORMIN HCL 1000 MG PO TABS: ORAL_TABLET | ORAL | 1 refills | Status: DC

## 2021-01-25 MED ORDER — LEVOTHYROXINE SODIUM 50 MCG PO TABS: 50.0000 ug | ORAL_TABLET | Freq: Every day | ORAL | 1 refills | Status: DC

## 2021-01-25 MED ORDER — TAMSULOSIN HCL 0.4 MG PO CAPS: 0.4000 mg | ORAL_CAPSULE | Freq: Every day | ORAL | 1 refills | Status: DC

## 2021-01-25 MED ORDER — OMEPRAZOLE 20 MG PO CPDR: 20.0000 mg | DELAYED_RELEASE_CAPSULE | Freq: Every day | ORAL | 1 refills | Status: DC

## 2021-01-25 MED ORDER — AMLODIPINE BESYLATE 2.5 MG PO TABS: 2.5000 mg | ORAL_TABLET | Freq: Every day | ORAL | 1 refills | Status: DC

## 2021-01-25 MED ORDER — ISOSORBIDE MONONITRATE ER 60 MG PO TB24: 30.0000 mg | ORAL_TABLET | Freq: Every day | ORAL | 1 refills | Status: DC

## 2021-01-25 MED ORDER — LISINOPRIL 10 MG PO TABS: 10.0000 mg | ORAL_TABLET | Freq: Every day | ORAL | 1 refills | Status: DC

## 2021-01-25 NOTE — Progress Notes (Signed)
Assessment & Plan:  1. Diabetes mellitus type 2 in nonobese [facility]) Lab Results  Component Value Date   HGBA1C 6.9 (H) 11/23/2020   HGBA1C 6.6 07/28/2020   HGBA1C 6.7 12/25/2019    - Diabetes is at goal of A1c < 7. - Medications: continue current medications - Home glucose monitoring: continue monitoring - Patient is currently taking a statin. Patient is taking an ACE-inhibitor/ARB.  - Instruction/counseling given: reminded to get eye exam  Diabetes Health Maintenance Due  Topic Date Due   OPHTHALMOLOGY EXAM  08/31/2018   HEMOGLOBIN A1C  05/26/2021   FOOT EXAM  07/28/2021    - lisinopril (ZESTRIL) 10 MG tablet; Take 1 tablet (10 mg total) by mouth daily.  Dispense: 90 tablet; Refill: 1 - metFORMIN (GLUCOPHAGE) 1000 MG tablet; TAKE ONE TABLET (1000MG TOTAL) BY MOUTH TWICE DAILY WITH A MEAL  Dispense: 180 tablet; Refill: 1 - pioglitazone (ACTOS) 30 MG tablet; Take 1 tablet (30 mg total) by mouth daily.  Dispense: 90 tablet; Refill: 1 - CBC with Differential/Platelet - CMP14+EGFR - Lipid panel  2. Hypertension associated with type 2 diabetes mellitus ([place]) Well controlled on current regimen. Discussed symptoms of low blood pressure and advised to let me know if this occurs.  Medications not decreased due to adequate blood pressure readings at home. - lisinopril (ZESTRIL) 10 MG tablet; Take 1 tablet (10 mg total) by mouth daily.  Dispense: 90 tablet; Refill: 1 - amLODipine (NORVASC) 2.5 MG tablet; Take 1 tablet (2.5 mg total) by mouth daily.  Dispense: 90 tablet; Refill: 1 - isosorbide mononitrate (IMDUR) 60 MG 24 hr tablet; Take 0.5 tablets (30 mg total) by mouth daily.  Dispense: 45 tablet; Refill: 1 - CBC with Differential/Platelet - CMP14+EGFR - Lipid panel  3. Hyperlipidemia associated with type 2 diabetes mellitus ([place]) Well controlled on current regimen.  - CMP14+EGFR - Lipid panel  4. Urinary dysfunction Well controlled on current regimen.   - tamsulosin (FLOMAX) 0.4  MG CAPS capsule; Take 1 capsule (0.4 mg total) by mouth daily after supper.  Dispense: 90 capsule; Refill: 1 - CMP14+EGFR  5. Aortic atherosclerosis ([place]) On a statin. - Lipid panel  6. Hypothyroidism, unspecified type Labs to assess. - levothyroxine (SYNTHROID) 50 MCG tablet; Take 1 tablet (50 mcg total) by mouth daily before breakfast.  Dispense: 90 tablet; Refill: 1 - TSH - T4, free  7. Gastroesophageal reflux disease, unspecified whether esophagitis present Well controlled on current regimen.  - omeprazole (PRILOSEC) 20 MG capsule; Take 1 capsule (20 mg total) by mouth daily.  Dispense: 90 capsule; Refill: 1 - CMP14+EGFR  8. Bilateral lower extremity edema Encouraged to elevate his legs when he is sitting.  Continue low-salt diet.  Encouraged compression hose.  Education provided on edema. - TSH - T4, free - CMP14+EGFR - CBC with Differential/Platelet   Return in about 4 months (around 05/28/2021) for annual physical.  Jacob Limes, MSN, APRN, FNP-C [facility]  Subjective:    [name], male    DOB: 29-Jan-1951, 70 y.o.   MRN: [id number]  Patient Care Team: Jacob Brooklyn, FNP as PCP - General (Family Medicine) Fowler, Jacob Means, MD (Neurology) Jacob Rhodes, MD (Inactive) as Consulting Physician (Urology) Jacob Breeding, MD as Consulting Physician (Cardiology) Jacob China, RN as Registered Nurse   Chief Complaint:  Chief Complaint  Patient presents with   Diabetes   Hypertension    6 month follow up of chronic medical conditions  Foot Swelling    Bilatetal x 5 days    HPI: Jacob Fowler is a 70 y.o. male presenting on 01/25/2021 for Diabetes, Hypertension (6 month follow up of chronic medical conditions ), and Foot Swelling (Bilatetal x 5 days)  Patient is accompanied by his wife who he is okay with being present.   Diabetes: Patient presents for follow up of diabetes. Current symptoms  include: none. Known diabetic complications: none. Medication compliance: yes. Current diet: in general, a "healthy" diet  . Current exercise: none. Home blood sugar records: BGs are running consistent with Hgb A1C. Is he  on ACE inhibitor or angiotensin II receptor blocker? Yes. Is he on a statin? Yes.   Hypertension: Patient's blood pressure is on the low side this morning.  He has already taken his medications this morning.  He does check his blood pressure at home and reports it is normally 299B systolic.  Patient is following with neurology for MS and urology for his elevated PSA. He has also been battling kidney stones for the past couple of months, which he is finally over.  New complaints: Patient reports swelling in both feet and ankles that started five days ago. He states he usual swells on the right side, but not the left. He does try to elevate his legs when sitting. He does not wear compression hose. He does eat low salt.    Social history:  Relevant past medical, surgical, family and social history reviewed and updated as indicated. Interim medical history since our last visit reviewed.  Allergies and medications reviewed and updated.  DATA REVIEWED: CHART IN EPIC  ROS: Negative unless specifically indicated above in HPI.    Current Outpatient Medications:    alfuzosin (UROXATRAL) 10 MG 24 hr tablet, Take 1 tablet (10 mg total) by mouth in the morning and at bedtime., Disp: 60 tablet, Rfl: 11   amLODipine (NORVASC) 2.5 MG tablet, Take 1 tablet (2.5 mg total) by mouth daily., Disp: 90 tablet, Rfl: 1   aspirin 81 MG EC tablet, Take 81 mg by mouth daily., Disp: , Rfl:    BETASERON 0.3 MG KIT injection, Inject 0.25 mg into the skin every other day. (Patient taking differently: Inject 0.3 mg into the skin every 7 (seven) days.), Disp: 1 kit, Rfl: 11   cholecalciferol (VITAMIN D) 1000 UNITS tablet, Take 1,000 Units by mouth daily., Disp: , Rfl:    clotrimazole-betamethasone  (LOTRISONE) cream, Apply 1 application topically 2 (two) times daily., Disp: 30 g, Rfl: 2   cyanocobalamin (,VITAMIN B-12,) 1000 MCG/ML injection, INJECT 1ML INTO THE SKIN ONCE MONTH (Patient taking differently: Inject 1,000 mcg into the skin every 30 (thirty) days.), Disp: 3 mL, Rfl: 1   fish oil-omega-3 fatty acids 1000 MG capsule, Take 1 g by mouth at bedtime., Disp: , Rfl:    glucose blood test strip, Use as instructed, Disp: 100 each, Rfl: 12   isosorbide mononitrate (IMDUR) 60 MG 24 hr tablet, Take 0.5 tablets (30 mg total) by mouth daily., Disp: 45 tablet, Rfl: 1   levothyroxine (SYNTHROID) 50 MCG tablet, Take 1 tablet (50 mcg total) by mouth daily before breakfast., Disp: 90 tablet, Rfl: 1   lisinopril (ZESTRIL) 10 MG tablet, Take 1 tablet (10 mg total) by mouth daily., Disp: 90 tablet, Rfl: 1   metFORMIN (GLUCOPHAGE) 1000 MG tablet, TAKE ONE TABLET (1000MG TOTAL) BY MOUTH TWICE DAILY WITH A MEAL, Disp: 180 tablet, Rfl: 0   omeprazole (PRILOSEC) 20 MG capsule, Take 1  capsule (20 mg total) by mouth daily., Disp: 90 capsule, Rfl: 1   pioglitazone (ACTOS) 30 MG tablet, Take 1 tablet (30 mg total) by mouth daily., Disp: 90 tablet, Rfl: 1   rosuvastatin (CRESTOR) 10 MG tablet, TAKE ONE TABLET (10MG TOTAL) BY MOUTH ATBEDTIME, Disp: 90 tablet, Rfl: 0   SYRINGE-NEEDLE, DISP, 3 ML (B-D SYRINGE/NEEDLE 3CC/25GX5/8) 25G X 5/8" 3 ML MISC, Use to give once a month B12 injections, Disp: 12 each, Rfl: 0   tamsulosin (FLOMAX) 0.4 MG CAPS capsule, Take 1 capsule (0.4 mg total) by mouth daily after supper., Disp: 90 capsule, Rfl: 1   No Known Allergies Past Medical History:  Diagnosis Date   Acquired leg length discrepancy    Bladder stone    BPH associated with nocturia    CAD (coronary artery disease) CARDIOLOGIST- DR HOCHREIN--  LAST VISIT 09-27-2011 IN EPIC   Non obstructive Cath 2010-  HX CORONARY SPASM 2004   Cataract    bilateral    GERD (gastroesophageal reflux disease)    H/O hiatal hernia     Heart murmur    Heartburn    High cholesterol    History of nephrolithiasis 2009   Hx of adenomatous colonic polyps    Hyperlipidemia    Hypertension    Movement disorder    Multiple sclerosis ([place]) DX  10/10---  NEUROLOGIST  DR Delfino Lovett [facility] ([place])   RIGHT SIDE AFFECTED MORE W/ WEAKNESS- - USES CANE   Neuromuscular disorder ([facility])    MS   NIDDM (non-insulin dependent diabetes mellitus)    Peyronie's disease    RBBB    Renal stone RIGHT   Rosacea    Vision abnormalities     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 / NORMAL LVF   CYSTOSCOPY W/ RETROGRADES  10/30/2011   Procedure: CYSTOSCOPY WITH RETROGRADE PYELOGRAM;  Surgeon: Claybon Jabs, MD;  Location: [place];  Service: Urology;  Laterality: Right;   CYSTOSCOPY WITH LITHOLAPAXY  10/30/2011   Procedure: CYSTOSCOPY WITH LITHOLAPAXY;  Surgeon: Claybon Jabs, MD;  Location: [facility];  Service: Urology;  Laterality: N/A;   CYSTOSCOPY WITH RETROGRADE PYELOGRAM, URETEROSCOPY AND STENT PLACEMENT Left 11/25/2020   Procedure: CYSTOSCOPY WITH LEFT RETROGRADE PYELOGRAM, LEFT URETEROSCOPY WITH LASER AND LEFT URETERAL STENT PLACEMENT;  Surgeon: Cleon Gustin, MD;  Location: [facility];  Service: Urology;  Laterality: Left;   EXTRACORPOREAL SHOCK WAVE LITHOTRIPSY  09-05-2010   RIGHT   EYE SURGERY Right 08/30/2018   HOLMIUM LASER APPLICATION Left 3/41/9622   Procedure: HOLMIUM LASER APPLICATION;  Surgeon: Cleon Gustin, MD;  Location: [facility];  Service: Urology;  Laterality: Left;   NESBIT PROCEDURE  10-17-2004   CORRECTION OF PENILE ANGULATION (PEYRONIES DISEASE)    Social History   Socioeconomic History   Marital status: Married    Spouse name: Hassan Rowan   Number of children: 2   Years of education: 13   Highest education level: Some college, no degree  Occupational History    Occupation: retired    Comment: self -employed farmer  Tobacco Use   Smoking status: Former    Packs/day: 1.00    Years: 7.00    Pack years: 7.00    Types: Cigarettes    Quit date: 09/26/1973    Years since quitting: 47.3   Smokeless tobacco: Current    Types: Loss adjuster, chartered  Tobacco comments:    Chewing tobacco  Vaping Use   Vaping Use: Never used  Substance and Sexual Activity   Alcohol use: No    Alcohol/week: 0.0 standard drinks   Drug use: No   Sexual activity: Not on file  Other Topics Concern   Not on file  Social History Narrative   Lives with wife, has MS, mobility issues.   Social Determinants of Health   Financial Resource Strain: Low Risk    Difficulty of Paying Living Expenses: Not hard at all  Food Insecurity: No Food Insecurity   Worried About Charity fundraiser in the Last Year: Never true   [place] in the Last Year: Never true  Transportation Needs: No Transportation Needs   Lack of Transportation (Medical): No   Lack of Transportation (Non-Medical): No  Physical Activity: Insufficiently Active   Days of Exercise per Week: 4 days   Minutes of Exercise per Session: 20 min  Stress: No Stress Concern Present   Feeling of Stress : Not at all  Social Connections: Moderately Integrated   Frequency of Communication with Friends and Family: More than three times a week   Frequency of Social Gatherings with Friends and Family: More than three times a week   Attends Religious Services: More than 4 times per year   Active Member of [employer] or Organizations: No   Attends Archivist Meetings: Never   Marital Status: Married  Human resources officer Violence: Not At Risk   Fear of Current or Ex-Partner: No   Emotionally Abused: No   Physically Abused: No   Sexually Abused: No        Objective:    BP (!) 93/59   Pulse 81   Temp 97.9 F (36.6 C) (Temporal)   Ht 5' 6"  (1.676 m)   Wt 154 lb 12.8 oz (70.2 kg)   SpO2 100%   BMI 24.99 kg/m   Wt  Readings from Last 3 Encounters:  01/25/21 154 lb 12.8 oz (70.2 kg)  11/30/20 164 lb 14.5 oz (74.8 kg)  11/23/20 (P) 160 lb (72.6 kg)    Physical Exam Vitals reviewed.  Constitutional:      General: He is not in acute distress.    Appearance: Normal appearance. He is normal weight. He is not ill-appearing, toxic-appearing or diaphoretic.  HENT:     Head: Normocephalic and atraumatic.  Eyes:     General: No scleral icterus.       Right eye: No discharge.        Left eye: No discharge.     Conjunctiva/sclera: Conjunctivae normal.  Cardiovascular:     Rate and Rhythm: Normal rate and regular rhythm.     Heart sounds: Normal heart sounds. No murmur heard.   No friction rub. No gallop.  Pulmonary:     Effort: Pulmonary effort is normal. No respiratory distress.     Breath sounds: Normal breath sounds. No stridor. No wheezing, rhonchi or rales.  Musculoskeletal:        General: Normal range of motion.     Cervical back: Normal range of motion.     Right foot: Foot drop present.  Skin:    General: Skin is warm and dry.  Neurological:     Mental Status: He is alert and oriented to person, place, and time. Mental status is at baseline.     Gait: Gait abnormal (ambulates with rolling walker).  Psychiatric:  Mood and Affect: Mood normal.        Behavior: Behavior normal.        Thought Content: Thought content normal.        Judgment: Judgment normal.   Diabetic Foot Exam - Simple   No data filed      Lab Results  Component Value Date   TSH 2.600 12/25/2019   Lab Results  Component Value Date   WBC 8.0 12/03/2020   HGB 9.5 (L) 12/03/2020   HCT 29.4 (L) 12/03/2020   MCV 87.5 12/03/2020   PLT 229 12/03/2020   Lab Results  Component Value Date   NA 136 12/03/2020   K 4.1 12/03/2020   CO2 23 12/03/2020   GLUCOSE 164 (H) 12/03/2020   BUN 25 (H) 12/03/2020   CREATININE 1.18 12/03/2020   BILITOT 0.6 12/01/2020   ALKPHOS 92 12/01/2020   AST 32 12/01/2020   ALT  17 12/01/2020   PROT 6.6 12/01/2020   ALBUMIN 2.8 (L) 12/01/2020   CALCIUM 8.7 (L) 12/03/2020   ANIONGAP 8 12/03/2020   Lab Results  Component Value Date   CHOL 131 07/28/2020   Lab Results  Component Value Date   HDL 25 (L) 07/28/2020   Lab Results  Component Value Date   LDLCALC 78 07/28/2020   Lab Results  Component Value Date   TRIG 158 (H) 07/28/2020   Lab Results  Component Value Date   CHOLHDL 5.2 (H) 07/28/2020   Lab Results  Component Value Date   HGBA1C 6.9 (H) 11/23/2020

## 2021-01-26 ENCOUNTER — Other Ambulatory Visit: Payer: Self-pay | Admitting: Family Medicine

## 2021-01-26 DIAGNOSIS — E039 Hypothyroidism, unspecified: Secondary | ICD-10-CM

## 2021-01-26 LAB — LIPID PANEL
Chol/HDL Ratio: 4.8 ratio (ref 0.0–5.0)
Cholesterol, Total: 120 mg/dL (ref 100–199)
HDL: 25 mg/dL — ABNORMAL LOW (ref >39)
LDL Chol Calc (NIH): 70 mg/dL (ref 0–99)
Triglycerides: 141 mg/dL (ref 0–149)
VLDL Cholesterol Cal: 25 mg/dL (ref 5–40)

## 2021-01-26 LAB — CMP14+EGFR
ALT: 12 IU/L (ref 0–44)
AST: 17 IU/L (ref 0–40)
Albumin/Globulin Ratio: 1.3 (ref 1.2–2.2)
Albumin: 4.1 g/dL (ref 3.7–4.7)
Alkaline Phosphatase: 101 IU/L (ref 44–121)
BUN/Creatinine Ratio: 28 — ABNORMAL HIGH (ref 10–24)
BUN: 38 mg/dL — ABNORMAL HIGH (ref 8–27)
Bilirubin Total: 0.2 mg/dL (ref 0.0–1.2)
CO2: 20 mmol/L (ref 20–29)
Calcium: 9.4 mg/dL (ref 8.6–10.2)
Chloride: 102 mmol/L (ref 96–106)
Creatinine, Ser: 1.34 mg/dL — ABNORMAL HIGH (ref 0.76–1.27)
Globulin, Total: 3.2 g/dL (ref 1.5–4.5)
Glucose: 140 mg/dL — ABNORMAL HIGH (ref 65–99)
Potassium: 5.1 mmol/L (ref 3.5–5.2)
Sodium: 140 mmol/L (ref 134–144)
Total Protein: 7.3 g/dL (ref 6.0–8.5)
eGFR: 57 mL/min/{1.73_m2} — ABNORMAL LOW (ref >59)

## 2021-01-26 LAB — CBC WITH DIFFERENTIAL/PLATELET
Basophils Absolute: 0 10*3/uL (ref 0.0–0.2)
Basos: 1 %
EOS (ABSOLUTE): 0.2 10*3/uL (ref 0.0–0.4)
Eos: 3 %
Hematocrit: 29.6 % — ABNORMAL LOW (ref 37.5–51.0)
Hemoglobin: 9.3 g/dL — ABNORMAL LOW (ref 13.0–17.7)
Immature Grans (Abs): 0 10*3/uL (ref 0.0–0.1)
Immature Granulocytes: 1 %
Lymphocytes Absolute: 1.2 10*3/uL (ref 0.7–3.1)
Lymphs: 19 %
MCH: 25.7 pg — ABNORMAL LOW (ref 26.6–33.0)
MCHC: 31.4 g/dL — ABNORMAL LOW (ref 31.5–35.7)
MCV: 82 fL (ref 79–97)
Monocytes Absolute: 0.6 10*3/uL (ref 0.1–0.9)
Monocytes: 9 %
Neutrophils Absolute: 4.5 10*3/uL (ref 1.4–7.0)
Neutrophils: 67 %
Platelets: 263 10*3/uL (ref 150–450)
RBC: 3.62 x10E6/uL — ABNORMAL LOW (ref 4.14–5.80)
RDW: 14.5 % (ref 11.6–15.4)
WBC: 6.5 10*3/uL (ref 3.4–10.8)

## 2021-01-26 LAB — T4, FREE: Free T4: 1.14 ng/dL (ref 0.82–1.77)

## 2021-01-26 LAB — TSH: TSH: 7.57 u[IU]/mL — ABNORMAL HIGH (ref 0.450–4.500)

## 2021-01-26 MED ORDER — LEVOTHYROXINE SODIUM 75 MCG PO TABS: 75.0000 ug | ORAL_TABLET | Freq: Every day | ORAL | 2 refills | Status: DC

## 2021-02-14 ENCOUNTER — Encounter: Payer: Self-pay | Admitting: Neurology

## 2021-02-14 ENCOUNTER — Ambulatory Visit: Payer: PPO | Admitting: Neurology

## 2021-02-14 ENCOUNTER — Other Ambulatory Visit: Payer: Self-pay

## 2021-02-14 ENCOUNTER — Other Ambulatory Visit: Payer: Self-pay | Admitting: Family Medicine

## 2021-02-14 VITALS — BP 94/55 | HR 72 | Ht 66.0 in | Wt 154.0 lb

## 2021-02-14 DIAGNOSIS — M21371 Foot drop, right foot: Secondary | ICD-10-CM | POA: Diagnosis not present

## 2021-02-14 DIAGNOSIS — R261 Paralytic gait: Secondary | ICD-10-CM

## 2021-02-14 DIAGNOSIS — E119 Type 2 diabetes mellitus without complications: Secondary | ICD-10-CM

## 2021-02-14 DIAGNOSIS — E785 Hyperlipidemia, unspecified: Secondary | ICD-10-CM

## 2021-02-14 DIAGNOSIS — R39198 Other difficulties with micturition: Secondary | ICD-10-CM | POA: Diagnosis not present

## 2021-02-14 DIAGNOSIS — G35 Multiple sclerosis: Secondary | ICD-10-CM | POA: Diagnosis not present

## 2021-02-14 DIAGNOSIS — E1169 Type 2 diabetes mellitus with other specified complication: Secondary | ICD-10-CM

## 2021-02-14 NOTE — Progress Notes (Signed)
[facility]  PATIENT: Jacob Fowler DOB: 08-02-1950  REFERRING DOCTOR OR PCP:  Redge Gainer  _________________________________   HISTORICAL  CHIEF COMPLAINT:  Chief Complaint  Patient presents with   Follow-up    Rm 1, w/ wife Hassan Rowan. Here for 6 month MS f/u, on betaseron 1x weekly, per pt he has been doing well.     HISTORY OF PRESENT ILLNESS:  [name] is a 70 year old man with multiple sclerosis.      Update 02/14/2021:  He is on Betaseron.   He tolerates it well.   He is doing one a week now and does better.     MRI in 2020 showed no progression compared to 2018 so we opted to continue Betaseron.  He has 2 spinal cord plaques.    We discussed none of the medications are likely to significantly affect the slow progression.   He has had 4 stable MRI's brain/cervical spine since 2012  I reviewed labs from 07/28/2020:  Lymphocytes 0.6 and HgB 12.0.   CMP ok except increased glucose.     He is worsening with right > left leg weakness being his main problem..   No recent fall.   Had one last year.       He wears the AFO during the day.  No falls recently.  He uses a walker in the house and can go > 200 feet without a rest..  Ampyra and amantadine have not helped his gait.  He has urinary hesitancy and frequency.   He is on tamsulosin and alfuzosin with benefit.     Desmopressin ws stopped.   Vision is doing well.     MS History:    He began to note significant difficulty with gait in January 2010.      He had some back x-rays. He was then referred to neurosurgery that noted that he had plaques in his spine c/w  MS. He had further testing including MRI of the brain and spinal tap. These were all consistent with multiple sclerosis and by November 2010 he had started Betaseron. He feels that he has been mostly stable over the past 5 years but has noted that his gait is a little worse than it was in 2010. About 2013, he started using a cane.  Around 2020  he started using a walker more  IMAGING MRI cervical spine2/19/18 shows T2 hyperintense lesions within the spinal cord to the left adjacent to C4-C5 and to the right adjacent to T1, unchanged in appearance when compared to the previous MRI.     Mild degenerative changes at C3-C4, C4-C5 and C6-C7 that did not lead to any nerve root compression.    There is a normal enhancement pattern and there are no acute findings.   08/25/2016 MRI of the brain with and without contrast shows   Multiple T2/FLAIR hyperintense foci in the periventricular, juxtacortical and deep white matter of the hemispheres and 2 foci in the brainstem in a pattern and configuration consistent with chronic demyelinating plaque associated with multiple sclerosis. None of the foci appears to be acute. When compared to the MRI dated 09/28/2014, there is no interval change..   Mild to moderate cortical atrophy that has mildly progressed when compared to the previous MRI.Marland Kitchen    There are no acute findings and there is a normal enhancement pattern.  MRI 03/31/2019 brain and cervical spine shows " T2 hyperintense lesions within the spinal cord to the left adjacent to C4-C5 and  to the right adjacent to T1, unchanged in appearance when compared to the previous MRI."   MRI brain showed no change in MS lesions.   REVIEW OF SYSTEMS: Constitutional: No fevers, chills, sweats, or change in appetite.  He notes fatigue most afternoon.  He has occasional insomnia. Eyes: No visual changes, double vision, eye pain Ear, nose and throat: No hearing loss, ear pain, nasal congestion, sore throat Cardiovascular: No chest pain, palpitations Respiratory:  No shortness of breath at rest or with exertion.   No wheezes GastrointestinaI: No nausea, vomiting, diarrhea, abdominal pain, fecal incontinence Genitourinary:  urinary frequency and hesitancy  .  He has nocturia.   Musculoskeletal:  No neck pain, back pain Integumentary: He denies rash or skin  lesions. Neurological: as above Psychiatric: No depression at this time.  No anxiety Endocrine: No palpitations, diaphoresis, change in appetite, change in weigh or increased thirst Hematologic/Lymphatic:  No anemia, purpura, petechiae. Allergic/Immunologic: No itchy/runny eyes, nasal congestion, recent allergic reactions, rashes  ALLERGIES: No Known Allergies  HOME MEDICATIONS:  Current Outpatient Medications:    alfuzosin (UROXATRAL) 10 MG 24 hr tablet, Take 1 tablet (10 mg total) by mouth in the morning and at bedtime., Disp: 60 tablet, Rfl: 11   amLODipine (NORVASC) 2.5 MG tablet, Take 1 tablet (2.5 mg total) by mouth daily., Disp: 90 tablet, Rfl: 1   aspirin 81 MG EC tablet, Take 81 mg by mouth daily., Disp: , Rfl:    BETASERON 0.3 MG KIT injection, Inject 0.25 mg into the skin every other day. (Patient taking differently: Inject 0.3 mg into the skin every 7 (seven) days.), Disp: 1 kit, Rfl: 11   cholecalciferol (VITAMIN D) 1000 UNITS tablet, Take 1,000 Units by mouth daily., Disp: , Rfl:    clotrimazole-betamethasone (LOTRISONE) cream, Apply 1 application topically 2 (two) times daily., Disp: 30 g, Rfl: 2   cyanocobalamin (,VITAMIN B-12,) 1000 MCG/ML injection, INJECT 1ML INTO THE SKIN ONCE MONTH (Patient taking differently: Inject 1,000 mcg into the skin every 30 (thirty) days.), Disp: 3 mL, Rfl: 1   fish oil-omega-3 fatty acids 1000 MG capsule, Take 1 g by mouth at bedtime., Disp: , Rfl:    glucose blood test strip, Use as instructed, Disp: 100 each, Rfl: 12   isosorbide mononitrate (IMDUR) 60 MG 24 hr tablet, Take 0.5 tablets (30 mg total) by mouth daily., Disp: 45 tablet, Rfl: 1   levothyroxine (SYNTHROID) 75 MCG tablet, Take 1 tablet (75 mcg total) by mouth daily before breakfast., Disp: 30 tablet, Rfl: 2   lisinopril (ZESTRIL) 10 MG tablet, Take 1 tablet (10 mg total) by mouth daily., Disp: 90 tablet, Rfl: 1   metFORMIN (GLUCOPHAGE) 1000 MG tablet, TAKE ONE TABLET (1000MG TOTAL)  BY MOUTH TWICE DAILY WITH A MEAL, Disp: 180 tablet, Rfl: 1   omeprazole (PRILOSEC) 20 MG capsule, Take 1 capsule (20 mg total) by mouth daily., Disp: 90 capsule, Rfl: 1   pioglitazone (ACTOS) 30 MG tablet, Take 1 tablet (30 mg total) by mouth daily., Disp: 90 tablet, Rfl: 1   rosuvastatin (CRESTOR) 10 MG tablet, TAKE ONE TABLET (10MG TOTAL) BY MOUTH ATBEDTIME, Disp: 90 tablet, Rfl: 0   SYRINGE-NEEDLE, DISP, 3 ML (B-D SYRINGE/NEEDLE 3CC/25GX5/8) 25G X 5/8" 3 ML MISC, Use to give once a month B12 injections, Disp: 12 each, Rfl: 0   tamsulosin (FLOMAX) 0.4 MG CAPS capsule, Take 1 capsule (0.4 mg total) by mouth daily after supper., Disp: 90 capsule, Rfl: 1  PAST MEDICAL HISTORY: Past Medical History:  Diagnosis Date   Acquired leg length discrepancy    Bladder stone    BPH associated with nocturia    CAD (coronary artery disease) CARDIOLOGIST- DR HOCHREIN--  LAST VISIT 09-27-2011 IN EPIC   Non obstructive Cath 2010-  HX CORONARY SPASM 2004   Cataract    bilateral    GERD (gastroesophageal reflux disease)    H/O hiatal hernia    Heart murmur    Heartburn    High cholesterol    History of nephrolithiasis 2009   Hx of adenomatous colonic polyps    Hyperlipidemia    Hypertension    Movement disorder    Multiple sclerosis ([place]) DX  10/10---  NEUROLOGIST  DR Delfino Lovett [facility] ([place])   RIGHT SIDE AFFECTED MORE W/ WEAKNESS- - USES CANE   Neuromuscular disorder (HCC)    MS   NIDDM (non-insulin dependent diabetes mellitus)    Peyronie's disease    RBBB    Renal stone RIGHT   Rosacea    Vision abnormalities     PAST SURGICAL HISTORY: 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 / NORMAL LVF   CYSTOSCOPY W/ RETROGRADES  10/30/2011   Procedure: CYSTOSCOPY WITH RETROGRADE PYELOGRAM;  Surgeon: Claybon Jabs, MD;  Location: [place] [facility];  Service: Urology;   Laterality: Right;   CYSTOSCOPY WITH LITHOLAPAXY  10/30/2011   Procedure: CYSTOSCOPY WITH LITHOLAPAXY;  Surgeon: Claybon Jabs, MD;  [facility];  Service: Urology;  Laterality: N/A;   CYSTOSCOPY WITH RETROGRADE PYELOGRAM, URETEROSCOPY AND STENT PLACEMENT Left 11/25/2020   Procedure: CYSTOSCOPY WITH LEFT RETROGRADE PYELOGRAM, LEFT URETEROSCOPY WITH LASER AND LEFT URETERAL STENT PLACEMENT;  Surgeon: Cleon Gustin, MD;  Location: [facility];  Service: Urology;  Laterality: Left;   EXTRACORPOREAL SHOCK WAVE LITHOTRIPSY  09-05-2010   RIGHT   EYE SURGERY Right 08/30/2018   HOLMIUM LASER APPLICATION Left 6/62/9476   Procedure: HOLMIUM LASER APPLICATION;  Surgeon: Cleon Gustin, MD;  Location: [facility];  Service: Urology;  Laterality: Left;   NESBIT PROCEDURE  10-17-2004   CORRECTION OF PENILE ANGULATION (PEYRONIES DISEASE)    FAMILY HISTORY: Family History  Problem Relation Age of Onset   Stroke Mother    Heart attack Mother    Diabetes Mother    Hypertension Mother    Renal cancer Father    Diabetes Sister    Heart attack Brother    Diabetes Son    Heart attack Maternal Grandfather    Heart attack Brother    Bladder Cancer Brother     SOCIAL HISTORY:  Social History   Socioeconomic History   Marital status: Married    Spouse name: Hassan Rowan   Number of children: 2   Years of education: 13   Highest education level: Some college, no degree  Occupational History   Occupation: retired    Comment: self -employed farmer  Tobacco Use   Smoking status: Former    Packs/day: 1.00    Years: 7.00    Pack years: 7.00    Types: Cigarettes    Quit date: 09/26/1973    Years since quitting: 47.4   Smokeless tobacco: Current    Types: Chew   Tobacco comments:    Chewing tobacco  Vaping Use   Vaping Use: Never used  Substance and Sexual Activity   Alcohol use: No    Alcohol/week: 0.0 standard  drinks   Drug use: No   Sexual activity: Not on file  Other  Topics Concern   Not on file  Social History Narrative   Lives with wife, has MS, mobility issues.   Social Determinants of Health   Financial Resource Strain: Low Risk    Difficulty of Paying Living Expenses: Not hard at all  Food Insecurity: No Food Insecurity   Worried About Charity fundraiser in the Last Year: Never true   [place] in the Last Year: Never true  Transportation Needs: No Transportation Needs   Lack of Transportation (Medical): No   Lack of Transportation (Non-Medical): No  Physical Activity: Insufficiently Active   Days of Exercise per Week: 4 days   Minutes of Exercise per Session: 20 min  Stress: No Stress Concern Present   Feeling of Stress : Not at all  Social Connections: Moderately Integrated   Frequency of Communication with Friends and Family: More than three times a week   Frequency of Social Gatherings with Friends and Family: More than three times a week   Attends Religious Services: More than 4 times per year   Active Member of [employer] or Organizations: No   Attends Archivist Meetings: Never   Marital Status: Married  Human resources officer Violence: Not At Risk   Fear of Current or Ex-Partner: No   Emotionally Abused: No   Physically Abused: No   Sexually Abused: No     PHYSICAL EXAM  Vitals:   02/14/21 0828  BP: (!) 94/55  Pulse: 72  Weight: 154 lb (69.9 kg)  Height: 5' 6"  (1.676 m)    Body mass index is 24.86 kg/m.   General: The patient is well-developed and well-nourished and in no acute distress  Neurologic Exam  Mental status: The patient is alert and oriented x 3 at the time of the examination. The patient has apparent normal recent and remote memory, with an apparently normal attention span and concentration ability.   Speech is normal.  Cranial nerves: Extraocular movements are full.   There is good facial sensation to soft touch bilaterally.Facial strength is normal.  Trapezius and sternocleidomastoid strength  is normal. No dysarthria is noted.  The tongue is midline, and the patient has symmetric elevation of the soft palate. No obvious hearing deficits are noted.  Motor:  Muscle bulk is normal.   Increased muscle tone in the legs, right greater than left.  The strength was 2-/5 in the right and 4/5 in the left hip flexures. Strength was 2+/5 in the right quad and 4+/5 on the left. Distally strength was 2 in the right leg and 4 in the left leg. Strength was 5/5 in the arms.     Sensory: Sensory testing is intact to touch and vibration sensation in all 4 extremities.  Coordination: Cerebellar testing shows good finger-nose-finger.  He has reduced heel-to-shin on the left and is unable to do on the right  Gait and station: Station is normal.  The gait is spastic and wide.  Severe rigth foot drop.   Needs to use the walker.    The left leg is mildly weak as he walks  The right leg is also externally rotated with a hyperextended knee as he walks.     Romberg is negative..   Reflexes:  Deep tendon reflexes were normal in the arms.  Deep tendon reflexes are increased in both legs, right > left     DIAGNOSTIC DATA (LABS, IMAGING,  TESTING) - I reviewed patient records, labs, notes, testing and imaging myself where available.  Lab Results  Component Value Date   WBC 6.5 01/25/2021   HGB 9.3 (L) 01/25/2021   HCT 29.6 (L) 01/25/2021   MCV 82 01/25/2021   PLT 263 01/25/2021      Component Value Date/Time   NA 140 01/25/2021 0927   K 5.1 01/25/2021 0927   CL 102 01/25/2021 0927   CO2 20 01/25/2021 0927   GLUCOSE 140 (H) 01/25/2021 0927   GLUCOSE 164 (H) 12/03/2020 0501   BUN 38 (H) 01/25/2021 0927   CREATININE 1.34 (H) 01/25/2021 0927   CREATININE 1.13 01/06/2013 1311   CALCIUM 9.4 01/25/2021 0927   PROT 7.3 01/25/2021 0927   ALBUMIN 4.1 01/25/2021 0927   AST 17 01/25/2021 0927   ALT 12 01/25/2021 0927   ALKPHOS 101 01/25/2021 0927   BILITOT 0.2 01/25/2021 0927   GFRNONAA >60 12/03/2020  0501   GFRNONAA 69 01/06/2013 1311   GFRAA 80 07/28/2020 0811   GFRAA 80 01/06/2013 1311   Lab Results  Component Value Date   CHOL 120 01/25/2021   HDL 25 (L) 01/25/2021   LDLCALC 70 01/25/2021   TRIG 141 01/25/2021   CHOLHDL 4.8 01/25/2021   Lab Results  Component Value Date   HGBA1C 6.9 (H) 11/23/2020        ASSESSMENT AND PLAN  Multiple sclerosis (HCC)  Urinary dysfunction  Spastic gait  Right foot drop   1.   For now, he will continue Betaseron and change to twice weekly.   We will consider d/c DMT in the future assuming MRI's continue to show no new lesions  2.   Continue bladder medication 3.   Continue to use a walker and right AFO.     4.  He will return to see [place]  in 6 months or sooner if he has new or worsening neurologic symptoms.   Jaydence Vanyo A. Felecia Shelling, MD, PhD 0/09/7541, 60:67 PM Certified in Neurology, Clinical Neurophysiology, Sleep Medicine, Pain Medicine and Neuroimaging  [facility] [address] [place], [place] [address] [phone]

## 2021-03-04 ENCOUNTER — Ambulatory Visit (HOSPITAL_COMMUNITY)
Admission: RE | Admit: 2021-03-04 | Discharge: 2021-03-04 | Disposition: A | Discharge status: Home / Self Care | Payer: PPO | Source: Ambulatory Visit | Attending: Urology | Admitting: Urology

## 2021-03-04 ENCOUNTER — Other Ambulatory Visit: Payer: Self-pay

## 2021-03-04 DIAGNOSIS — N200 Calculus of kidney: Secondary | ICD-10-CM | POA: Diagnosis not present

## 2021-03-09 ENCOUNTER — Telehealth: Payer: PPO | Admitting: Urology

## 2021-03-28 ENCOUNTER — Other Ambulatory Visit: Payer: Self-pay

## 2021-03-28 ENCOUNTER — Other Ambulatory Visit: Payer: PPO

## 2021-03-28 DIAGNOSIS — E039 Hypothyroidism, unspecified: Secondary | ICD-10-CM

## 2021-03-29 LAB — TSH: TSH: 2.16 u[IU]/mL (ref 0.450–4.500)

## 2021-03-29 LAB — T4, FREE: Free T4: 1.3 ng/dL (ref 0.82–1.77)

## 2021-04-04 ENCOUNTER — Other Ambulatory Visit: Payer: Self-pay | Admitting: Family Medicine

## 2021-04-04 DIAGNOSIS — I152 Hypertension secondary to endocrine disorders: Secondary | ICD-10-CM

## 2021-04-04 DIAGNOSIS — E1159 Type 2 diabetes mellitus with other circulatory complications: Secondary | ICD-10-CM

## 2021-04-05 ENCOUNTER — Other Ambulatory Visit: Payer: Self-pay

## 2021-04-05 ENCOUNTER — Encounter: Payer: Self-pay | Admitting: Urology

## 2021-04-05 ENCOUNTER — Telehealth (INDEPENDENT_AMBULATORY_CARE_PROVIDER_SITE_OTHER): Payer: PPO | Admitting: Urology

## 2021-04-05 DIAGNOSIS — R351 Nocturia: Secondary | ICD-10-CM | POA: Diagnosis not present

## 2021-04-05 DIAGNOSIS — N200 Calculus of kidney: Secondary | ICD-10-CM

## 2021-04-05 MED ORDER — ALFUZOSIN HCL ER 10 MG PO TB24: 10.0000 mg | ORAL_TABLET | Freq: Two times a day (BID) | ORAL | 3 refills | Status: DC

## 2021-04-05 NOTE — Patient Instructions (Signed)
Dietary Guidelines to Help Prevent Kidney Stones Kidney stones are deposits of minerals and salts that form inside your kidneys. Your risk of developing kidney stones may be greater depending on your diet, your lifestyle, the medicines you take, and whether you have certain medical conditions. Most people can lower their chances of developing kidney stones by following the instructions below. Your dietitian may give you more specific instructions depending on your overall health and the type of kidney stones you tend to develop. What are tips for following this plan? Reading food labels  Choose foods with "no salt added" or "low-salt" labels. Limit your salt (sodium) intake to less than 1,500 mg a day. Choose foods with calcium for each meal and snack. Try to eat about 300 mg of calcium at each meal. Foods that contain 200-500 mg of calcium a serving include: 8 oz (237 mL) of milk, calcium-fortifiednon-dairy milk, and calcium-fortifiedfruit juice. Calcium-fortified means that calcium has been added to these drinks. 8 oz (237 mL) of kefir, yogurt, and soy yogurt. 4 oz (114 g) of tofu. 1 oz (28 g) of cheese. 1 cup (150 g) of dried figs. 1 cup (91 g) of cooked broccoli. One 3 oz (85 g) can of sardines or mackerel. Most people need 1,000-1,500 mg of calcium a day. Talk to your dietitian about how much calcium is recommended for you. Shopping Buy plenty of fresh fruits and vegetables. Most people do not need to avoid fruits and vegetables, even if these foods contain nutrients that may contribute to kidney stones. When shopping for convenience foods, choose: Whole pieces of fruit. Pre-made salads with dressing on the side. Low-fat fruit and yogurt smoothies. Avoid buying frozen meals or prepared deli foods. These can be high in sodium. Look for foods with live cultures, such as yogurt and kefir. Choose high-fiber grains, such as whole-wheat breads, oat bran, and wheat cereals. Cooking Do not add  salt to food when cooking. Place a salt shaker on the table and allow each person to add his or her own salt to taste. Use vegetable protein, such as beans, textured vegetable protein (TVP), or tofu, instead of meat in pasta, casseroles, and soups. Meal planning Eat less salt, if told by your dietitian. To do this: Avoid eating processed or pre-made food. Avoid eating fast food. Eat less animal protein, including cheese, meat, poultry, or fish, if told by your dietitian. To do this: Limit the number of times you have meat, poultry, fish, or cheese each week. Eat a diet free of meat at least 2 days a week. Eat only one serving each day of meat, poultry, fish, or seafood. When you prepare animal protein, cut pieces into small portion sizes. For most meat and fish, one serving is about the size of the palm of your hand. Eat at least five servings of fresh fruits and vegetables each day. To do this: Keep fruits and vegetables on hand for snacks. Eat one piece of fruit or a handful of berries with breakfast. Have a salad and fruit at lunch. Have two kinds of vegetables at dinner. Limit foods that are high in a substance called oxalate. These include: Spinach (cooked), rhubarb, beets, sweet potatoes, and Swiss chard. Peanuts. Potato chips, french fries, and baked potatoes with skin on. Nuts and nut products. Chocolate. If you regularly take a diuretic medicine, make sure to eat at least 1 or 2 servings of fruits or vegetables that are high in potassium each day. These include: Avocado. Banana. Orange, prune,   carrot, or tomato juice. Baked potato. Cabbage. Beans and split peas. Lifestyle  Drink enough fluid to keep your urine pale yellow. This is the most important thing you can do. Spread your fluid intake throughout the day. If you drink alcohol: Limit how much you use to: 0-1 drink a day for women who are not pregnant. 0-2 drinks a day for men. Be aware of how much alcohol is in your  drink. In the [place] one drink equals one 12 oz bottle of beer (355 mL), one 5 oz glass of wine (148 mL), or one 1 oz glass of hard liquor (44 mL). Lose weight if told by your health care provider. Work with your dietitian to find an eating plan and weight loss strategies that work best for you. General information Talk to your health care provider and dietitian about taking daily supplements. You may be told the following depending on your health and the cause of your kidney stones: Not to take supplements with vitamin C. To take a calcium supplement. To take a daily probiotic supplement. To take other supplements such as magnesium, fish oil, or vitamin B6. Take over-the-counter and prescription medicines only as told by your health care provider. These include supplements. What foods should I limit? Limit your intake of the following foods, or eat them as told by your dietitian. Vegetables Spinach. Rhubarb. Beets. Canned vegetables. Pickles. Olives. Baked potatoes with skin. Grains Wheat bran. Baked goods. Salted crackers. Cereals high in sugar. Meats and other proteins Nuts. Nut butters. Large portions of meat, poultry, or fish. Salted, precooked, or cured meats, such as sausages, meat loaves, and hot dogs. Dairy Cheese. Beverages Regular soft drinks. Regular vegetable juice. Seasonings and condiments Seasoning blends with salt. Salad dressings. Soy sauce. Ketchup. Barbecue sauce. Other foods Canned soups. Canned pasta sauce. Casseroles. Pizza. Lasagna. Frozen meals. Potato chips. French fries. The items listed above may not be a complete list of foods and beverages you should limit. Contact a dietitian for more information. What foods should I avoid? Talk to your dietitian about specific foods you should avoid based on the type of kidney stones you have and your overall health. Fruits Grapefruit. The item listed above may not be a complete list of foods and beverages you should  avoid. Contact a dietitian for more information. Summary Kidney stones are deposits of minerals and salts that form inside your kidneys. You can lower your risk of kidney stones by making changes to your diet. The most important thing you can do is drink enough fluid. Drink enough fluid to keep your urine pale yellow. Talk to your dietitian about how much calcium you should have each day, and eat less salt and animal protein as told by your dietitian. This information is not intended to replace advice given to you by your health care provider. Make sure you discuss any questions you have with your health care provider. Document Revised: 06/26/2019 Document Reviewed: 06/26/2019 Elsevier Patient Education  2022 [employer]  

## 2021-04-05 NOTE — Progress Notes (Signed)
04/05/2021 3:17 PM   Jacob Fowler Dec 05, 1950 [id number]  Referring provider: [name], [place],  [place] [address]  Patient location: home Physician location: office I connected with  Jacob Fowler on 04/05/21 by a video enabled telemedicine application and verified that I am speaking with the correct person using two identifiers.   I discussed the limitations of evaluation and management by telemedicine. The patient expressed understanding and agreed to proceed.    Nephrolithiasis   HPI: Mr Jacob Fowler is a 70yo here for followup for nephrolithiasis. No stone events since last visit. He drinks 40oz of lemonade daily. He has intermittent right flank pain. Renal US 8/19 shows left lower pole calculi. No worsening LUTS with uroxatral 16m BID. Urine stream strong. Nocturia 1-2x. No straining to urinate. No other complaints.    PMH: Past Medical History:  Diagnosis Date   Acquired leg length discrepancy    Bladder stone    BPH associated with nocturia    CAD (coronary artery disease) CARDIOLOGIST- DR HOCHREIN--  LAST VISIT 09-27-2011 IN EPIC   Non obstructive Cath 2010-  HX CORONARY SPASM 2004   Cataract    bilateral    GERD (gastroesophageal reflux disease)    H/O hiatal hernia    Heart murmur    Heartburn    High cholesterol    History of nephrolithiasis 2009   Hx of adenomatous colonic polyps    Hyperlipidemia    Hypertension    Movement disorder    Multiple sclerosis ([facility] DX  10/10---  NEUROLOGIST  DR RDelfino Jacob Fowler ([place])   RIGHT SIDE AFFECTED MORE W/ WEAKNESS- - USES CANE   Neuromuscular disorder (HCC)    MS   NIDDM (non-insulin dependent diabetes mellitus)    Peyronie's disease    RBBB    Renal stone RIGHT   Rosacea    Vision abnormalities     Surgical History: Past Surgical History:  Procedure Laterality Date   CARDIAC CATHETERIZATION  02-26-2009 ---  DR TIda Rogue  NONOBSTRUCTIVE CAD/ 50% DISTAL  RCA/ 30% LAD / NORMAL LVF   CYSTOSCOPY W/ RETROGRADES  10/30/2011   Procedure: CYSTOSCOPY WITH RETROGRADE PYELOGRAM;  Surgeon: MClaybon Jabs MD;  [facility]  Service: Urology;  Laterality: Right;   CYSTOSCOPY WITH LITHOLAPAXY  10/30/2011   Procedure: CYSTOSCOPY WITH LITHOLAPAXY;  Surgeon: MClaybon Jabs MD;  [facility]  Service: Urology;  Laterality: N/A;   CYSTOSCOPY WITH RETROGRADE PYELOGRAM, URETEROSCOPY AND STENT PLACEMENT Left 11/25/2020   Procedure: CYSTOSCOPY WITH LEFT RETROGRADE PYELOGRAM, LEFT URETEROSCOPY WITH LASER AND LEFT URETERAL STENT PLACEMENT;  Surgeon: MCleon Gustin MD;  Location: [facility];  Service: Urology;  Laterality: Left;   EXTRACORPOREAL SHOCK WAVE LITHOTRIPSY  09-05-2010   RIGHT   EYE SURGERY Right 08/30/2018   HOLMIUM LASER APPLICATION Left 51/43/8887  Procedure: HOLMIUM LASER APPLICATION;  Surgeon: MCleon Gustin MD;  Location: [facility];  Service: Urology;  Laterality: Left;   NESBIT PROCEDURE  10-17-2004   CORRECTION OF PENILE ANGULATION (PEYRONIES DISEASE)    Home Medications:  Allergies as of 04/05/2021   No Known Allergies      Medication List        Accurate as of April 05, 2021  3:17 PM. If you have any questions, ask your nurse or doctor.          alfuzosin 10 MG 24 hr tablet Commonly known as: UROXATRAL Take 1  tablet (10 mg total) by mouth in the morning and at bedtime.   amLODipine 2.5 MG tablet Commonly known as: NORVASC TAKE ONE TABLET (2.5MG TOTAL) BY MOUTH DAILY   aspirin 81 MG EC tablet Take 81 mg by mouth daily.   Betaseron 0.3 MG Kit injection Generic drug: Interferon Beta-1b Inject 0.25 mg into the skin every other day. What changed:  how much to take when to take this   cholecalciferol 1000 units tablet Commonly known as: VITAMIN D Take 1,000 Units by mouth daily.   clotrimazole-betamethasone cream Commonly known as: Lotrisone Apply 1 application  topically 2 (two) times daily.   cyanocobalamin 1000 MCG/ML injection Commonly known as: (VITAMIN B-12) INJECT 1ML INTO THE SKIN ONCE MONTH What changed:  how much to take how to take this when to take this additional instructions   fish oil-omega-3 fatty acids 1000 MG capsule Take 1 g by mouth at bedtime.   glucose blood test strip Use as instructed   isosorbide mononitrate 60 MG 24 hr tablet Commonly known as: IMDUR Take 0.5 tablets (30 mg total) by mouth daily.   levothyroxine 75 MCG tablet Commonly known as: SYNTHROID Take 1 tablet (75 mcg total) by mouth daily before breakfast.   lisinopril 10 MG tablet Commonly known as: ZESTRIL Take 1 tablet (10 mg total) by mouth daily.   metFORMIN 1000 MG tablet Commonly known as: GLUCOPHAGE TAKE ONE TABLET (1000MG TOTAL) BY MOUTH TWICE DAILY WITH A MEAL   omeprazole 20 MG capsule Commonly known as: PRILOSEC Take 1 capsule (20 mg total) by mouth daily.   pioglitazone 30 MG tablet Commonly known as: ACTOS Take 1 tablet (30 mg total) by mouth daily.   rosuvastatin 10 MG tablet Commonly known as: CRESTOR TAKE ONE TABLET (10MG TOTAL) BY MOUTH ATBEDTIME   SYRINGE-NEEDLE (DISP) 3 ML 25G X 5/8" 3 ML Misc Commonly known as: B-D SYRINGE/NEEDLE 3CC/25GX5/8 Use to give once a month B12 injections   tamsulosin 0.4 MG Caps capsule Commonly known as: FLOMAX Take 1 capsule (0.4 mg total) by mouth daily after supper.        Allergies: No Known Allergies  Family History: Family History  Problem Relation Age of Onset   Stroke Mother    Heart attack Mother    Diabetes Mother    Hypertension Mother    Renal cancer Father    Diabetes Sister    Heart attack Brother    Diabetes Son    Heart attack Maternal Grandfather    Heart attack Brother    Bladder Cancer Brother     Social History:  reports that he quit smoking about 47 years ago. His smoking use included cigarettes. He has a 7.00 pack-year smoking history. His  smokeless tobacco use includes chew. He reports that he does not drink alcohol and does not use drugs.  ROS: All other review of systems were reviewed and are negative except what is noted above in HPI   Laboratory Data: Lab Results  Component Value Date   WBC 6.5 01/25/2021   HGB 9.3 (L) 01/25/2021   HCT 29.6 (L) 01/25/2021   MCV 82 01/25/2021   PLT 263 01/25/2021    Lab Results  Component Value Date   CREATININE 1.34 (H) 01/25/2021    Lab Results  Component Value Date   PSA 5.5 (H) 01/28/2014   PSA 3.37 01/06/2013   PSA 3.08 10/14/2012    No results found for: TESTOSTERONE  Lab Results  Component Value Date  HGBA1C 6.9 (H) 11/23/2020    Urinalysis    Component Value Date/Time   COLORURINE YELLOW 11/30/2020 0914   APPEARANCEUR Cloudy (A) 01/14/2021 1122   LABSPEC 1.016 11/30/2020 0914   PHURINE 6.0 11/30/2020 0914   GLUCOSEU Negative 01/14/2021 1122   HGBUR LARGE (A) 11/30/2020 0914   BILIRUBINUR Negative 01/14/2021 1122   KETONESUR NEGATIVE 11/30/2020 0914   PROTEINUR 3+ (A) 01/14/2021 1122   PROTEINUR 30 (A) 11/30/2020 0914   UROBILINOGEN negative 12/18/2014 1601   UROBILINOGEN 1.0 04/06/2010 0509   NITRITE Negative 01/14/2021 1122   NITRITE NEGATIVE 11/30/2020 0914   LEUKOCYTESUR 3+ (A) 01/14/2021 1122   LEUKOCYTESUR MODERATE (A) 11/30/2020 0914    Lab Results  Component Value Date   LABMICR See below: 01/14/2021   WBCUA >30 (A) 01/14/2021   RBCUA >30 (A) 08/07/2018   LABEPIT None seen 01/14/2021   MUCUS Present 01/14/2021   BACTERIA Few 01/14/2021    Pertinent Imaging: Renal US 03/04/2021: IMages reviewed and discussed with the patient  Results for orders placed during the hospital encounter of 10/30/11  KUB day of procedure  Narrative *RADIOLOGY REPORT*  Clinical Data: Right ureteral stone  ABDOMEN - 1 VIEW  Comparison: [facility] CT urogram dated 10/17/2011  Findings: Prior distal right ureteral calculus is not  definitely visualized on the current radiograph.  Known nonobstructing right lower pole calculi are also not radiographically apparent.  Calcified phleboliths in the pelvis.  Linear calcification overlying the right sacrum corresponds to a vascular calcification.  Suspected radiopaque ingested tablets in the splenic flexure, sigmoid colon (overlying superior bladder), and cecum.  Degenerative changes of the visualized thoracolumbar spine.  IMPRESSION: Prior distal right ureteral calculus is not definitely visualized on the current radiograph.  Known nonobstructing right lower pole calculi are also not radiographically apparent.  Original Report Authenticated By: Julian Hy, M.D.  No results found for this or any previous visit.  No results found for this or any previous visit.  No results found for this or any previous visit.  Results for orders placed during the hospital encounter of 03/04/21  Ultrasound renal complete  Narrative CLINICAL DATA:  Nephrolithiasis. History of a left ureteroscopically and stent placement on 11/25/2020.  EXAM: RENAL / URINARY TRACT ULTRASOUND COMPLETE  COMPARISON:  01/13/2021, CT abdomen and pelvis.  FINDINGS: Right Kidney:  Renal measurements: 9.8 x 4.9 x 4.6 cm = volume: 116.6 mL. Echogenicity within normal limits. No mass or hydronephrosis visualized.  Left Kidney:  Renal measurements: 11.2 x 5.8 x 4.8 cm = volume: 161.1 mL. Echogenic shadowing calculi in the mid and lower pole, largest 5 mm. No renal masses. No hydronephrosis. Normal parenchymal echogenicity.  Bladder:  Appears normal for degree of bladder distention.  Other:  None.  IMPRESSION: 1. No acute findings.  No hydronephrosis. 2. Left intrarenal stones.   Electronically Signed By: Lajean Manes M.D. On: 03/05/2021 17:38  No results found for this or any previous visit.  No results found for this or any previous visit.  Results for orders placed  during the hospital encounter of 01/13/21  CT RENAL STONE STUDY  Narrative CLINICAL DATA:  Right-sided back and flank pain for several months. Urinoma.  EXAM: CT ABDOMEN AND PELVIS WITHOUT CONTRAST  TECHNIQUE: Multidetector CT imaging of the abdomen and pelvis was performed following the standard protocol without IV contrast.  COMPARISON:  12/03/2020  FINDINGS: Lower chest: No acute findings.  Hepatobiliary: No mass visualized on this unenhanced exam. Gallbladder is unremarkable. No evidence of  biliary ductal dilatation.  Pancreas: No mass or inflammatory process visualized on this unenhanced exam.  Spleen:  Within normal limits in size.  Adrenals/Urinary tract: A few tiny less than 5 mm renal calculi are again seen bilaterally. Left ureteral stent remains in appropriate position, and there is no evidence of hydronephrosis. Previously seen left perinephric and retroperitoneal fluid collection has nearly completely resolved since prior study. Mild diffuse bladder wall thickening is again seen, likely due to chronic bladder outlet obstruction given enlarged prostate.  Stomach/Bowel: Tiny hiatal hernia noted. No evidence of obstruction, inflammatory process, or abnormal fluid collections. Normal appendix visualized.  Vascular/Lymphatic: No pathologically enlarged lymph nodes identified. No evidence of abdominal aortic aneurysm. Aortic atherosclerotic calcification noted.  Reproductive:  Mildly enlarged prostate again noted.  Other:  None.  Musculoskeletal:  No suspicious bone lesions identified.  IMPRESSION: Left ureteral stent in appropriate position. No evidence of hydronephrosis or other acute findings.  Near complete resolution of left perinephric and retroperitoneal fluid collection.  Tiny nonobstructing bilateral renal calculi.  Stable mildly enlarged prostate and findings of chronic bladder outlet obstruction.  Tiny hiatal hernia.  Aortic  Atherosclerosis (ICD10-I70.0).   Electronically Signed By: Marlaine Hind M.D. On: 01/14/2021 17:17   Assessment & Plan:    1. Nephrolithiasis -RTC 3 months with renal US  2. Nocturia -Continue uroxatral 41m BID   No follow-ups on file.  PNicolette Bang MD  [facility]

## 2021-04-27 ENCOUNTER — Other Ambulatory Visit: Payer: Self-pay | Admitting: Family Medicine

## 2021-04-27 DIAGNOSIS — E039 Hypothyroidism, unspecified: Secondary | ICD-10-CM

## 2021-04-28 ENCOUNTER — Other Ambulatory Visit: Payer: Self-pay | Admitting: Family Medicine

## 2021-04-28 DIAGNOSIS — E785 Hyperlipidemia, unspecified: Secondary | ICD-10-CM

## 2021-04-28 DIAGNOSIS — E119 Type 2 diabetes mellitus without complications: Secondary | ICD-10-CM

## 2021-04-28 DIAGNOSIS — E1169 Type 2 diabetes mellitus with other specified complication: Secondary | ICD-10-CM

## 2021-05-17 ENCOUNTER — Telehealth: Payer: Self-pay | Admitting: Family Medicine

## 2021-05-17 NOTE — Telephone Encounter (Signed)
Wife states that she does not see DR. G. States that she just wanted him to have a "doctor".  Offered to switch to another provider who was accepting new patients and she declined since they were not "doctors".  States he will stay with [facility].

## 2021-05-17 NOTE — Telephone Encounter (Signed)
That is fine with me, but I don't think she is accepting new patients. [facility]sel, and [facility] Fill[facility] are accepting new patients.

## 2021-05-17 NOTE — Telephone Encounter (Signed)
I am only accepting family members of current patients. Do I see his wife?

## 2021-05-27 ENCOUNTER — Other Ambulatory Visit: Payer: Self-pay | Admitting: Family Medicine

## 2021-05-27 DIAGNOSIS — E039 Hypothyroidism, unspecified: Secondary | ICD-10-CM

## 2021-06-01 ENCOUNTER — Ambulatory Visit (INDEPENDENT_AMBULATORY_CARE_PROVIDER_SITE_OTHER): Payer: PPO | Admitting: Family Medicine

## 2021-06-01 ENCOUNTER — Other Ambulatory Visit: Payer: Self-pay

## 2021-06-01 ENCOUNTER — Encounter: Payer: Self-pay | Admitting: Family Medicine

## 2021-06-01 VITALS — BP 99/55 | HR 69 | Temp 97.8°F

## 2021-06-01 DIAGNOSIS — G35 Multiple sclerosis: Secondary | ICD-10-CM

## 2021-06-01 DIAGNOSIS — N138 Other obstructive and reflux uropathy: Secondary | ICD-10-CM | POA: Diagnosis not present

## 2021-06-01 DIAGNOSIS — Z0000 Encounter for general adult medical examination without abnormal findings: Secondary | ICD-10-CM

## 2021-06-01 DIAGNOSIS — N401 Enlarged prostate with lower urinary tract symptoms: Secondary | ICD-10-CM

## 2021-06-01 DIAGNOSIS — E1159 Type 2 diabetes mellitus with other circulatory complications: Secondary | ICD-10-CM | POA: Diagnosis not present

## 2021-06-01 DIAGNOSIS — E785 Hyperlipidemia, unspecified: Secondary | ICD-10-CM | POA: Diagnosis not present

## 2021-06-01 DIAGNOSIS — Z23 Encounter for immunization: Secondary | ICD-10-CM

## 2021-06-01 DIAGNOSIS — E039 Hypothyroidism, unspecified: Secondary | ICD-10-CM

## 2021-06-01 DIAGNOSIS — I700 Atherosclerosis of aorta: Secondary | ICD-10-CM

## 2021-06-01 DIAGNOSIS — Z0001 Encounter for general adult medical examination with abnormal findings: Secondary | ICD-10-CM

## 2021-06-01 DIAGNOSIS — E119 Type 2 diabetes mellitus without complications: Secondary | ICD-10-CM

## 2021-06-01 DIAGNOSIS — I2510 Atherosclerotic heart disease of native coronary artery without angina pectoris: Secondary | ICD-10-CM

## 2021-06-01 DIAGNOSIS — E1169 Type 2 diabetes mellitus with other specified complication: Secondary | ICD-10-CM | POA: Diagnosis not present

## 2021-06-01 DIAGNOSIS — K219 Gastro-esophageal reflux disease without esophagitis: Secondary | ICD-10-CM | POA: Diagnosis not present

## 2021-06-01 DIAGNOSIS — I152 Hypertension secondary to endocrine disorders: Secondary | ICD-10-CM | POA: Diagnosis not present

## 2021-06-01 LAB — BAYER DCA HB A1C WAIVED: HB A1C (BAYER DCA - WAIVED): 6.8 % — ABNORMAL HIGH (ref 4.8–5.6)

## 2021-06-01 NOTE — Progress Notes (Signed)
Assessment & Plan:  1. Well adult exam Preventive health education provided. - Lipid panel - CBC with Differential/Platelet - CMP14+EGFR  2. Diabetes mellitus type 2 in nonobese [facility]) Lab Results  Component Value Date   HGBA1C 6.8 (H) 06/01/2021   HGBA1C 6.9 (H) 11/23/2020   HGBA1C 6.6 07/28/2020    - Diabetes is at goal of A1c < 7. - Medications: continue current medications - Home glucose monitoring: continue monitoring - Patient is currently taking a statin. Patient is taking an ACE-inhibitor/ARB.  - Instruction/counseling given: reminded to get eye exam  Diabetes Health Maintenance Due  Topic Date Due   OPHTHALMOLOGY EXAM  08/31/2018   FOOT EXAM  07/28/2021   HEMOGLOBIN A1C  11/29/2021    Lab Results  Component Value Date   LABMICR See below: 01/14/2021   LABMICR See below: 12/29/2020   MICROALBUR negative 02/19/2015   MICROALBUR neg 01/28/2014   - Lipid panel - CBC with Differential/Platelet - CMP14+EGFR - Bayer DCA Hb A1c Waived  3. Hypertension associated with type 2 diabetes mellitus ([place]) Soft blood pressures - amlodipine stopped. Patient to continue monitoring BP at home and let me know if he is staying on the lower end. - Lipid panel - CBC with Differential/Platelet - CMP14+EGFR  4. Hyperlipidemia associated with type 2 diabetes mellitus ([place]) Well controlled on current regimen.  - Lipid panel - CMP14+EGFR  5. Aortic atherosclerosis (HCC) Continue statin and aspirin. - Lipid panel - CMP14+EGFR  6. Atherosclerosis of native coronary artery of native heart without angina pectoris Continue statin and aspirin. - Lipid panel - CMP14+EGFR  7. Acquired hypothyroidism Well controlled on current regimen.   8. Gastroesophageal reflux disease, unspecified whether esophagitis present Well controlled on current regimen.  - CMP14+EGFR  9. BPH with obstruction/lower urinary tract symptoms Well controlled on current regimen. Managed by urology. -  CMP14+EGFR  10. Multiple sclerosis ([place]) Managed by neurology.   11. Need for immunization against influenza - Flu Vaccine QUAD High Dose(Fluad)   Follow-up: Return in about 6 months (around 11/29/2021) for follow-up of chronic medication conditions.   Hendricks Limes, MSN, APRN, FNP-C [facility]  Subjective:  [name], male    DOB: July 02, 1951  Age: 70 y.o. MRN: [id number]  Patient Care Team: [name], FNP as PCP - General (Family Medicine) Sater, Nanine Means, MD (Neurology) Kathie Rhodes, MD (Inactive) as Consulting Physician (Urology) Minus Breeding, MD as Consulting Physician (Cardiology) Ilean China, RN as Registered Nurse Celestia Khat, [place] (Optometry)   CC:  Chief Complaint  Patient presents with   Annual Exam    HPI Jacob Fowler presents for his annual physical. He is accompanied by his wife, who he is okay with being present.  Occupation: retired, Marital status: married, Substance use: none Diet: regular, Exercise: unable due to MS Last eye exam: 3-4 years ago Last dental exam: goes every 6 months Last colonoscopy: 12/31/2017 with repeat in 5 years (2024) Lung Cancer Screening with low-dose Chest CT: quit > 15 years ago. AAA Screening: declined Hepatitis C Screening: declined PSA: >88 years of age Immunizations: Flu Vaccine:  agrees to get today Tdap Vaccine: up to date  Shingrix Vaccine:  unable to take due to [place]   COVID-19 Vaccine: declined Pneumonia Vaccine: up to date  Advanced Directives Patient does have advanced directives including living will and healthcare power of attorney. He does not have a copy in the electronic medical record.   DEPRESSION SCREENING PHQ  2/9 Scores 06/01/2021 01/25/2021 11/18/2020 07/28/2020 12/25/2019 11/18/2019 08/27/2019  PHQ - 2 Score 0 0 0 0 0 0 0  PHQ- 9 Score 0 0 - - - - -     Diabetes: Patient presents for follow up of diabetes. Current symptoms include:  none. Known diabetic complications: none. Medication compliance: yes. Current diet: in general, a "healthy" diet  . Current exercise:  unable due to MS . Home blood sugar records: BGs are running  consistent with Hgb A1C. Is he  on ACE inhibitor or angiotensin II receptor blocker? Yes (Lisinopril). Is he on a statin? Yes (Rosuvastatin).   Hypertension: Patient's blood pressure is on the low side again today.  He has already taken his medications this morning.  He does check his blood pressure at home and reports readings similar to today.  Hyperlipidemia/Aortic atherosclerosis/Coronary atherosclerosis: taking Rosuvastatin and aspirin.   Hypothyroidism: taking levothyroxine. Last labs on 03/28/2021 WNL.   GERD: taking omeprazole.   Patient is following with neurology for MS and urology for his elevated PSA.  Review of Systems  Constitutional:  Negative for chills, fever, malaise/fatigue and weight loss.  HENT:  Negative for congestion, ear discharge, ear pain, nosebleeds, sinus pain, sore throat and tinnitus.   Eyes:  Negative for blurred vision, double vision, pain, discharge and redness.  Respiratory:  Negative for cough, shortness of breath and wheezing.   Cardiovascular:  Negative for chest pain, palpitations and leg swelling.  Gastrointestinal:  Negative for abdominal pain, constipation, diarrhea, heartburn, nausea and vomiting.  Genitourinary:  Negative for dysuria.  Skin:  Negative for rash.  Neurological:  Positive for weakness. Negative for dizziness, seizures and headaches.  Psychiatric/Behavioral:  Negative for depression, substance abuse and suicidal ideas. The patient is not nervous/anxious.     Current Outpatient Medications:    alfuzosin (UROXATRAL) 10 MG 24 hr tablet, Take 1 tablet (10 mg total) by mouth in the morning and at bedtime., Disp: 180 tablet, Rfl: 3   amLODipine (NORVASC) 2.5 MG tablet, TAKE ONE TABLET (2.$RemoveBefor'5MG'jvzawgeMTNLM$  TOTAL) BY MOUTH DAILY, Disp: 90 tablet, Rfl: 0    aspirin 81 MG EC tablet, Take 81 mg by mouth daily., Disp: , Rfl:    BETASERON 0.3 MG KIT injection, Inject 0.25 mg into the skin every other day. (Patient taking differently: Inject 0.3 mg into the skin every 7 (seven) days.), Disp: 1 kit, Rfl: 11   cholecalciferol (VITAMIN D) 1000 UNITS tablet, Take 1,000 Units by mouth daily., Disp: , Rfl:    clotrimazole-betamethasone (LOTRISONE) cream, Apply 1 application topically 2 (two) times daily., Disp: 30 g, Rfl: 2   cyanocobalamin (,VITAMIN B-12,) 1000 MCG/ML injection, INJECT 1ML INTO THE SKIN ONCE MONTH (Patient taking differently: Inject 1,000 mcg into the skin every 30 (thirty) days.), Disp: 3 mL, Rfl: 1   fish oil-omega-3 fatty acids 1000 MG capsule, Take 1 g by mouth at bedtime., Disp: , Rfl:    glucose blood test strip, Use as instructed, Disp: 100 each, Rfl: 12   isosorbide mononitrate (IMDUR) 60 MG 24 hr tablet, Take 0.5 tablets (30 mg total) by mouth daily., Disp: 45 tablet, Rfl: 1   levothyroxine (SYNTHROID) 75 MCG tablet, TAKE ONE TABLET (75MCG TOTAL) BY MOUTH DAILY BEFORE BREAKFAST, Disp: 90 tablet, Rfl: 3   lisinopril (ZESTRIL) 10 MG tablet, Take 1 tablet (10 mg total) by mouth daily., Disp: 90 tablet, Rfl: 1   metFORMIN (GLUCOPHAGE) 1000 MG tablet, TAKE ONE TABLET ($RemoveBef'1000MG'YqpbAGwjTJ$  TOTAL) BY MOUTH TWICE DAILY WITH A MEAL, Disp:  180 tablet, Rfl: 1   omeprazole (PRILOSEC) 20 MG capsule, Take 1 capsule (20 mg total) by mouth daily., Disp: 90 capsule, Rfl: 1   pioglitazone (ACTOS) 30 MG tablet, Take 1 tablet (30 mg total) by mouth daily., Disp: 90 tablet, Rfl: 1   rosuvastatin (CRESTOR) 10 MG tablet, TAKE ONE TABLET (10MG TOTAL) BY MOUTH ATBEDTIME, Disp: 90 tablet, Rfl: 0   SYRINGE-NEEDLE, DISP, 3 ML (B-D SYRINGE/NEEDLE 3CC/25GX5/8) 25G X 5/8" 3 ML MISC, Use to give once a month B12 injections, Disp: 12 each, Rfl: 0   tamsulosin (FLOMAX) 0.4 MG CAPS capsule, Take 1 capsule (0.4 mg total) by mouth daily after supper., Disp: 90 capsule, Rfl: 1  No Known  Allergies  Past Medical History:  Diagnosis Date   Acquired leg length discrepancy    Bladder stone    BPH associated with nocturia    CAD (coronary artery disease) CARDIOLOGIST- DR HOCHREIN--  LAST VISIT 09-27-2011 IN EPIC   Non obstructive Cath 2010-  HX CORONARY SPASM 2004   Cataract    bilateral    GERD (gastroesophageal reflux disease)    H/O hiatal hernia    Heart murmur    Heartburn    High cholesterol    History of nephrolithiasis 2009   Hx of adenomatous colonic polyps    Hyperlipidemia    Hypertension    Movement disorder    Multiple sclerosis ([place]) DX  10/10---  NEUROLOGIST  DR Delfino Lovett [facility] ([place])   RIGHT SIDE AFFECTED MORE W/ WEAKNESS- - USES CANE   Neuromuscular disorder (HCC)    MS   NIDDM (non-insulin dependent diabetes mellitus)    Peyronie's disease    RBBB    Renal stone RIGHT   Rosacea    Vision abnormalities     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 / NORMAL LVF   CYSTOSCOPY W/ RETROGRADES  10/30/2011   Procedure: CYSTOSCOPY WITH RETROGRADE PYELOGRAM;  Surgeon: Claybon Jabs, MD;  Location: [place];  Service: Urology;  Laterality: Right;   CYSTOSCOPY WITH LITHOLAPAXY  10/30/2011   Procedure: CYSTOSCOPY WITH LITHOLAPAXY;  Surgeon: Claybon Jabs, MD;  [facility];  Service: Urology;  Laterality: N/A;   CYSTOSCOPY WITH RETROGRADE PYELOGRAM, URETEROSCOPY AND STENT PLACEMENT Left 11/25/2020   Procedure: CYSTOSCOPY WITH LEFT RETROGRADE PYELOGRAM, LEFT URETEROSCOPY WITH LASER AND LEFT URETERAL STENT PLACEMENT;  Surgeon: Cleon Gustin, MD;  Location: [facility];  Service: Urology;  Laterality: Left;   EXTRACORPOREAL SHOCK WAVE LITHOTRIPSY  09-05-2010   RIGHT   EYE SURGERY Right 08/30/2018   HOLMIUM LASER APPLICATION Left 8/33/8250   Procedure: HOLMIUM LASER APPLICATION;  Surgeon: Cleon Gustin, MD;  Location: [facility];  Service: Urology;  Laterality: Left;   NESBIT PROCEDURE  10-17-2004   CORRECTION OF PENILE ANGULATION (PEYRONIES DISEASE)    Family History  Problem Relation Age of Onset   Stroke Mother    Heart attack Mother    Diabetes Mother    Hypertension Mother    Renal cancer Father    Diabetes Sister    Heart attack Brother    Diabetes Son    Heart attack Maternal Grandfather    Heart attack Brother    Bladder Cancer Brother     Social History   Socioeconomic History   Marital status: Married    Spouse name: Hassan Rowan  Number of children: 2   Years of education: 13   Highest education level: Some college, no degree  Occupational History   Occupation: retired    Comment: self -employed farmer  Tobacco Use   Smoking status: Former    Packs/day: 1.00    Years: 7.00    Pack years: 7.00    Types: Cigarettes    Quit date: 09/26/1973    Years since quitting: 47.7   Smokeless tobacco: Current    Types: Chew   Tobacco comments:    Chewing tobacco  Vaping Use   Vaping Use: Never used  Substance and Sexual Activity   Alcohol use: No    Alcohol/week: 0.0 standard drinks   Drug use: No   Sexual activity: Not on file  Other Topics Concern   Not on file  Social History Narrative   Lives with wife, has MS, mobility issues.   Social Determinants of Health   Financial Resource Strain: Low Risk    Difficulty of Paying Living Expenses: Not hard at all  Food Insecurity: No Food Insecurity   Worried About Charity fundraiser in the Last Year: Never true   [place] in the Last Year: Never true  Transportation Needs: No Transportation Needs   Lack of Transportation (Medical): No   Lack of Transportation (Non-Medical): No  Physical Activity: Insufficiently Active   Days of Exercise per Week: 4 days   Minutes of Exercise per Session: 20 min  Stress: No Stress Concern Present   Feeling of Stress : Not at all  Social Connections: Moderately  Integrated   Frequency of Communication with Friends and Family: More than three times a week   Frequency of Social Gatherings with Friends and Family: More than three times a week   Attends Religious Services: More than 4 times per year   Active Member of [employer] or Organizations: No   Attends Archivist Meetings: Never   Marital Status: Married  Human resources officer Violence: Not At Risk   Fear of Current or Ex-Partner: No   Emotionally Abused: No   Physically Abused: No   Sexually Abused: No      Objective:    BP (!) 99/55   Pulse 69   Temp 97.8 F (36.6 C) (Temporal)   SpO2 95%   Wt Readings from Last 3 Encounters:  02/14/21 154 lb (69.9 kg)  01/25/21 154 lb 12.8 oz (70.2 kg)  11/30/20 164 lb 14.5 oz (74.8 kg)    Physical Exam Vitals reviewed.  Constitutional:      General: He is not in acute distress.    Appearance: Normal appearance. He is not ill-appearing, toxic-appearing or diaphoretic.  HENT:     Head: Normocephalic and atraumatic.     Right Ear: Tympanic membrane, ear canal and external ear normal. There is no impacted cerumen.     Left Ear: Tympanic membrane, ear canal and external ear normal. There is no impacted cerumen.     Nose: Nose normal. No congestion or rhinorrhea.     Mouth/Throat:     Mouth: Mucous membranes are moist.     Pharynx: Oropharynx is clear. No oropharyngeal exudate or posterior oropharyngeal erythema.  Eyes:     General: No scleral icterus.       Right eye: No discharge.        Left eye: No discharge.     Conjunctiva/sclera: Conjunctivae normal.     Pupils: Pupils are equal, round, and reactive to light.  Neck:     Vascular: No carotid bruit.  Cardiovascular:     Rate and Rhythm: Normal rate and regular rhythm.     Heart sounds: Normal heart sounds. No murmur heard.   No friction rub. No gallop.  Pulmonary:     Effort: Pulmonary effort is normal. No respiratory distress.     Breath sounds: Normal breath sounds. No stridor.  No wheezing, rhonchi or rales.  Abdominal:     General: Abdomen is flat. Bowel sounds are normal. There is no distension.     Palpations: Abdomen is soft. There is no hepatomegaly, splenomegaly or mass.     Tenderness: There is no abdominal tenderness. There is no guarding or rebound.     Hernia: No hernia is present.  Musculoskeletal:        General: Normal range of motion.     Cervical back: Normal range of motion and neck supple. No rigidity. No muscular tenderness.     Right lower leg: No edema.     Left lower leg: No edema.  Lymphadenopathy:     Cervical: No cervical adenopathy.  Skin:    General: Skin is warm and dry.     Capillary Refill: Capillary refill takes less than 2 seconds.  Neurological:     General: No focal deficit present.     Mental Status: He is alert and oriented to person, place, and time. Mental status is at baseline.  Psychiatric:        Mood and Affect: Mood normal.        Behavior: Behavior normal.        Thought Content: Thought content normal.        Judgment: Judgment normal.    Lab Results  Component Value Date   TSH 2.160 03/28/2021   Lab Results  Component Value Date   WBC 6.5 01/25/2021   HGB 9.3 (L) 01/25/2021   HCT 29.6 (L) 01/25/2021   MCV 82 01/25/2021   PLT 263 01/25/2021   Lab Results  Component Value Date   NA 140 01/25/2021   K 5.1 01/25/2021   CO2 20 01/25/2021   GLUCOSE 140 (H) 01/25/2021   BUN 38 (H) 01/25/2021   CREATININE 1.34 (H) 01/25/2021   BILITOT 0.2 01/25/2021   ALKPHOS 101 01/25/2021   AST 17 01/25/2021   ALT 12 01/25/2021   PROT 7.3 01/25/2021   ALBUMIN 4.1 01/25/2021   CALCIUM 9.4 01/25/2021   ANIONGAP 8 12/03/2020   EGFR 57 (L) 01/25/2021   Lab Results  Component Value Date   CHOL 120 01/25/2021   Lab Results  Component Value Date   HDL 25 (L) 01/25/2021   Lab Results  Component Value Date   LDLCALC 70 01/25/2021   Lab Results  Component Value Date   TRIG 141 01/25/2021   Lab Results   Component Value Date   CHOLHDL 4.8 01/25/2021   Lab Results  Component Value Date   HGBA1C 6.9 (H) 11/23/2020

## 2021-06-01 NOTE — Patient Instructions (Addendum)
Stop taking amlodipine since your blood pressure readings are low.  Please schedule your diabetic eye exam.

## 2021-06-02 LAB — CMP14+EGFR
ALT: 10 IU/L (ref 0–44)
AST: 12 IU/L (ref 0–40)
Albumin/Globulin Ratio: 1.7 (ref 1.2–2.2)
Albumin: 4.4 g/dL (ref 3.7–4.7)
Alkaline Phosphatase: 68 IU/L (ref 44–121)
BUN/Creatinine Ratio: 21 (ref 10–24)
BUN: 25 mg/dL (ref 8–27)
Bilirubin Total: 0.2 mg/dL (ref 0.0–1.2)
CO2: 22 mmol/L (ref 20–29)
Calcium: 9.5 mg/dL (ref 8.6–10.2)
Chloride: 107 mmol/L — ABNORMAL HIGH (ref 96–106)
Creatinine, Ser: 1.2 mg/dL (ref 0.76–1.27)
Globulin, Total: 2.6 g/dL (ref 1.5–4.5)
Glucose: 122 mg/dL — ABNORMAL HIGH (ref 70–99)
Potassium: 4.3 mmol/L (ref 3.5–5.2)
Sodium: 143 mmol/L (ref 134–144)
Total Protein: 7 g/dL (ref 6.0–8.5)
eGFR: 65 mL/min/{1.73_m2} (ref >59)

## 2021-06-02 LAB — CBC WITH DIFFERENTIAL/PLATELET
Basophils Absolute: 0 10*3/uL (ref 0.0–0.2)
Basos: 0 %
EOS (ABSOLUTE): 0.1 10*3/uL (ref 0.0–0.4)
Eos: 2 %
Hematocrit: 35.1 % — ABNORMAL LOW (ref 37.5–51.0)
Hemoglobin: 10.9 g/dL — ABNORMAL LOW (ref 13.0–17.7)
Immature Grans (Abs): 0 10*3/uL (ref 0.0–0.1)
Immature Granulocytes: 0 %
Lymphocytes Absolute: 1 10*3/uL (ref 0.7–3.1)
Lymphs: 13 %
MCH: 25.5 pg — ABNORMAL LOW (ref 26.6–33.0)
MCHC: 31.1 g/dL — ABNORMAL LOW (ref 31.5–35.7)
MCV: 82 fL (ref 79–97)
Monocytes Absolute: 0.5 10*3/uL (ref 0.1–0.9)
Monocytes: 7 %
Neutrophils Absolute: 5.5 10*3/uL (ref 1.4–7.0)
Neutrophils: 78 %
Platelets: 222 10*3/uL (ref 150–450)
RBC: 4.27 x10E6/uL (ref 4.14–5.80)
RDW: 14.9 % (ref 11.6–15.4)
WBC: 7.2 10*3/uL (ref 3.4–10.8)

## 2021-06-02 LAB — LIPID PANEL
Chol/HDL Ratio: 4.3 ratio (ref 0.0–5.0)
Cholesterol, Total: 112 mg/dL (ref 100–199)
HDL: 26 mg/dL — ABNORMAL LOW (ref >39)
LDL Chol Calc (NIH): 65 mg/dL (ref 0–99)
Triglycerides: 116 mg/dL (ref 0–149)
VLDL Cholesterol Cal: 21 mg/dL (ref 5–40)

## 2021-06-04 ENCOUNTER — Encounter: Payer: Self-pay | Admitting: Family Medicine

## 2021-06-07 ENCOUNTER — Other Ambulatory Visit: Payer: Self-pay

## 2021-06-07 ENCOUNTER — Ambulatory Visit (HOSPITAL_COMMUNITY)
Admission: RE | Admit: 2021-06-07 | Discharge: 2021-06-07 | Disposition: A | Discharge status: Home / Self Care | Payer: PPO | Source: Ambulatory Visit | Attending: Urology | Admitting: Urology

## 2021-06-07 DIAGNOSIS — N200 Calculus of kidney: Secondary | ICD-10-CM | POA: Diagnosis not present

## 2021-06-13 ENCOUNTER — Telehealth: Payer: Self-pay | Admitting: *Deleted

## 2021-06-13 NOTE — Telephone Encounter (Signed)
Faxed completed/signed [employer] PAF form to [facility] at [phone]. Received fax confirmation.

## 2021-07-06 ENCOUNTER — Encounter: Payer: Self-pay | Admitting: Urology

## 2021-07-06 ENCOUNTER — Other Ambulatory Visit: Payer: Self-pay

## 2021-07-06 ENCOUNTER — Ambulatory Visit: Payer: PPO | Admitting: Urology

## 2021-07-06 VITALS — BP 136/79 | HR 75

## 2021-07-06 DIAGNOSIS — R351 Nocturia: Secondary | ICD-10-CM | POA: Diagnosis not present

## 2021-07-06 DIAGNOSIS — N200 Calculus of kidney: Secondary | ICD-10-CM | POA: Diagnosis not present

## 2021-07-06 MED ORDER — SILODOSIN 8 MG PO CAPS: 8.0000 mg | ORAL_CAPSULE | Freq: Every day | ORAL | 11 refills | Status: DC

## 2021-07-06 NOTE — Progress Notes (Signed)
Urological Symptom Review  Patient is experiencing the following symptoms: Frequent urination Hard to postpone urination Burning/pain with urination Get up at night to urinate Stream starts and stops   Review of Systems  Gastrointestinal (upper)  : Negative for upper GI symptoms  Gastrointestinal (lower) : Negative for lower GI symptoms  Constitutional : Negative for symptoms  Skin: Negative for skin symptoms  Eyes: Negative for eye symptoms  Ear/Nose/Throat : Negative for Ear/Nose/Throat symptoms  Hematologic/Lymphatic: Negative for Hematologic/Lymphatic symptoms  Cardiovascular : Leg swelling  Respiratory : Negative for respiratory symptoms  Endocrine: Negative for endocrine symptoms  Musculoskeletal: Negative for musculoskeletal symptoms  Neurological: Negative for neurological symptoms  Psychologic: Negative for psychiatric symptoms

## 2021-07-06 NOTE — Progress Notes (Signed)
07/06/2021 3:26 PM   Jacob Fowler 28-Jan-1951 [id number]  Referring provider: [name], FNP [address],  [place] [address]  Followup BPH and nephrolithiasis   HPI: Jacob Fowler is a 70yo here for followup for nephrolithiasis and BPH. IPSS 20 QOL 4 on flomax 0.70m daily. He has poor mobility and uses a walker. Renal UKorea11/22/2022 shows no calculi. No other complaints today   PMH: Past Medical History:  Diagnosis Date   Acquired leg length discrepancy    Bladder stone    BPH associated with nocturia    CAD (coronary artery disease) CARDIOLOGIST- DR HOCHREIN--  LAST VISIT 09-27-2011 IN EPIC   Non obstructive Cath 2010-  HX CORONARY SPASM 2004   Cataract    bilateral    GERD (gastroesophageal reflux disease)    H/O hiatal hernia    Heart murmur    Heartburn    High cholesterol    History of nephrolithiasis 2009   Hx of adenomatous colonic polyps    Hyperlipidemia    Hypertension    Movement disorder    [place] ([place]  10/10---  NEUROLOGIST  DR RDelfino LovettFATER ([place])   RIGHT SIDE AFFECTED MORE W/ WEAKNESS- - USES CANE   Nephrolithiasis 08/30/2020   Neuromuscular disorder (HCC)    MS   NIDDM (non-insulin dependent diabetes mellitus)    Peyronie's disease    RBBB    Renal stone RIGHT   Rosacea    Vision abnormalities     Surgical History: Past Surgical History:  Procedure Laterality Date   CARDIAC CATHETERIZATION  02-26-2009 ---  DR TIda Rogue  NONOBSTRUCTIVE CAD/ 50% DISTAL RCA/ 30% LAD / NORMAL LVF   CYSTOSCOPY W/ RETROGRADES  10/30/2011   Procedure: CYSTOSCOPY WITH RETROGRADE PYELOGRAM;  Surgeon: MClaybon Jabs MD;  [place] [facility]  Service: Urology;  Laterality: Right;   CYSTOSCOPY WITH LITHOLAPAXY  10/30/2011   Procedure: CYSTOSCOPY WITH LITHOLAPAXY;  Surgeon: MClaybon Jabs MD;  [facility]  Service: Urology;  Laterality: N/A;   CYSTOSCOPY WITH RETROGRADE  PYELOGRAM, URETEROSCOPY AND STENT PLACEMENT Left 11/25/2020   Procedure: CYSTOSCOPY WITH LEFT RETROGRADE PYELOGRAM, LEFT URETEROSCOPY WITH LASER AND LEFT URETERAL STENT PLACEMENT;  Surgeon: MCleon Gustin MD;  [facility];  Service: Urology;  Laterality: Left;   EXTRACORPOREAL SHOCK WAVE LITHOTRIPSY  09-05-2010   RIGHT   EYE SURGERY Right 08/30/2018   HOLMIUM LASER APPLICATION Left 57/51/7001  Procedure: HOLMIUM LASER APPLICATION;  Surgeon: MCleon Gustin MD;  [facility];  Service: Urology;  Laterality: Left;   NESBIT PROCEDURE  10-17-2004   CORRECTION OF PENILE ANGULATION (PEYRONIES DISEASE)    Home Medications:  Allergies as of 07/06/2021   No Known Allergies      Medication List        Accurate as of July 06, 2021  3:26 PM. If you have any questions, ask your nurse or doctor.          alfuzosin 10 MG 24 hr tablet Commonly known as: UROXATRAL Take 1 tablet (10 mg total) by mouth in the morning and at bedtime.   aspirin 81 MG EC tablet Take 81 mg by mouth daily.   Betaseron 0.3 MG Kit injection Generic drug: Interferon Beta-1b Inject 0.25 mg into the skin every other day. What changed:  how much to take when to take this   cholecalciferol 1000 units tablet Commonly known as: VITAMIN D Take  1,000 Units by mouth daily.   clotrimazole-betamethasone cream Commonly known as: Lotrisone Apply 1 application topically 2 (two) times daily.   cyanocobalamin 1000 MCG/ML injection Commonly known as: (VITAMIN B-12) INJECT 1ML INTO THE SKIN ONCE MONTH What changed:  how much to take how to take this when to take this additional instructions   fish oil-omega-3 fatty acids 1000 MG capsule Take 1 g by mouth at bedtime.   glucose blood test strip Use as instructed   isosorbide mononitrate 60 MG 24 hr tablet Commonly known as: IMDUR Take 0.5 tablets (30 mg total) by mouth daily.   levothyroxine 75 MCG tablet Commonly known as:  SYNTHROID TAKE ONE TABLET (75MCG TOTAL) BY MOUTH DAILY BEFORE BREAKFAST   lisinopril 10 MG tablet Commonly known as: ZESTRIL Take 1 tablet (10 mg total) by mouth daily.   metFORMIN 1000 MG tablet Commonly known as: GLUCOPHAGE TAKE ONE TABLET (1000MG TOTAL) BY MOUTH TWICE DAILY WITH A MEAL   omeprazole 20 MG capsule Commonly known as: PRILOSEC Take 1 capsule (20 mg total) by mouth daily.   pioglitazone 30 MG tablet Commonly known as: ACTOS Take 1 tablet (30 mg total) by mouth daily.   rosuvastatin 10 MG tablet Commonly known as: CRESTOR TAKE ONE TABLET (10MG TOTAL) BY MOUTH ATBEDTIME   SYRINGE-NEEDLE (DISP) 3 ML 25G X 5/8" 3 ML Misc Commonly known as: B-D SYRINGE/NEEDLE 3CC/25GX5/8 Use to give once a month B12 injections   tamsulosin 0.4 MG Caps capsule Commonly known as: FLOMAX Take 1 capsule (0.4 mg total) by mouth daily after supper.        Allergies: No Known Allergies  Family History: Family History  Problem Relation Age of Onset   Stroke Mother    Heart attack Mother    Diabetes Mother    Hypertension Mother    Renal cancer Father    Diabetes Sister    Heart attack Brother    Diabetes Son    Heart attack Maternal Grandfather    Heart attack Brother    Bladder Cancer Brother     Social History:  reports that he quit smoking about 47 years ago. His smoking use included cigarettes. He has a 7.00 pack-year smoking history. His smokeless tobacco use includes chew. He reports that he does not drink alcohol and does not use drugs.  ROS: All other review of systems were reviewed and are negative except what is noted above in HPI  Physical Exam: BP 136/79    Pulse 75   Constitutional:  Alert and oriented, No acute distress. HEENT: [place] AT, moist mucus membranes.  Trachea midline, no masses. Cardiovascular: No clubbing, cyanosis, or edema. Respiratory: Normal respiratory effort, no increased work of breathing. GI: Abdomen is soft, nontender, nondistended, no  abdominal masses GU: No CVA tenderness.  Lymph: No cervical or inguinal lymphadenopathy. Skin: No rashes, bruises or suspicious lesions. Neurologic: Grossly intact, no focal deficits, moving all 4 extremities. Psychiatric: Normal mood and affect.  Laboratory Data: Lab Results  Component Value Date   WBC 7.2 06/01/2021   HGB 10.9 (L) 06/01/2021   HCT 35.1 (L) 06/01/2021   MCV 82 06/01/2021   PLT 222 06/01/2021    Lab Results  Component Value Date   CREATININE 1.20 06/01/2021    Lab Results  Component Value Date   PSA 5.5 (H) 01/28/2014   PSA 3.37 01/06/2013   PSA 3.08 10/14/2012    No results found for: TESTOSTERONE  Lab Results  Component Value Date   HGBA1C  6.8 (H) 06/01/2021    Urinalysis    Component Value Date/Time   COLORURINE YELLOW 11/30/2020 0914   APPEARANCEUR Cloudy (A) 01/14/2021 1122   LABSPEC 1.016 11/30/2020 0914   PHURINE 6.0 11/30/2020 0914   GLUCOSEU Negative 01/14/2021 1122   HGBUR LARGE (A) 11/30/2020 0914   BILIRUBINUR Negative 01/14/2021 1122   KETONESUR NEGATIVE 11/30/2020 0914   PROTEINUR 3+ (A) 01/14/2021 1122   PROTEINUR 30 (A) 11/30/2020 0914   UROBILINOGEN negative 12/18/2014 1601   UROBILINOGEN 1.0 04/06/2010 0509   NITRITE Negative 01/14/2021 1122   NITRITE NEGATIVE 11/30/2020 0914   LEUKOCYTESUR 3+ (A) 01/14/2021 1122   LEUKOCYTESUR MODERATE (A) 11/30/2020 0914    Lab Results  Component Value Date   LABMICR See below: 01/14/2021   WBCUA >30 (A) 01/14/2021   RBCUA >30 (A) 08/07/2018   LABEPIT None seen 01/14/2021   MUCUS Present 01/14/2021   BACTERIA Few 01/14/2021    Pertinent Imaging: Renal US 06/07/2021: Images reviewed and discussed with the patient Results for orders placed during the hospital encounter of 10/30/11  KUB day of procedure  Narrative *RADIOLOGY REPORT*  Clinical Data: Right ureteral stone  ABDOMEN - 1 VIEW  Comparison: [facility] CT urogram dated 10/17/2011  Findings: Prior distal  right ureteral calculus is not definitely visualized on the current radiograph.  Known nonobstructing right lower pole calculi are also not radiographically apparent.  Calcified phleboliths in the pelvis.  Linear calcification overlying the right sacrum corresponds to a vascular calcification.  Suspected radiopaque ingested tablets in the splenic flexure, sigmoid colon (overlying superior bladder), and cecum.  Degenerative changes of the visualized thoracolumbar spine.  IMPRESSION: Prior distal right ureteral calculus is not definitely visualized on the current radiograph.  Known nonobstructing right lower pole calculi are also not radiographically apparent.  Original Report Authenticated By: Julian Hy, M.D.  No results found for this or any previous visit.  No results found for this or any previous visit.  No results found for this or any previous visit.  Results for orders placed during the hospital encounter of 06/07/21  US RENAL  Narrative CLINICAL DATA:  Nephrolithiasis  EXAM: RENAL / URINARY TRACT ULTRASOUND COMPLETE  COMPARISON:  CT 01/13/2021, ultrasound 03/04/2021  FINDINGS: Right Kidney:  Renal measurements: 9.2 x 4.8 x 5.2 cm = volume: 119.8 mL. Echogenicity within normal limits. No mass or hydronephrosis visualized.  Left Kidney:  Renal measurements: 10.3 x 5.1 x 5.4 cm = volume: 147.4 mL. Echogenicity within normal limits. No mass or hydronephrosis visualized.  Bladder:  Appears normal for degree of bladder distention.  Other:  None.  IMPRESSION: Negative renal ultrasound   Electronically Signed By: Donavan Foil M.D. On: 06/07/2021 21:52  No results found for this or any previous visit.  No results found for this or any previous visit.  Results for orders placed during the hospital encounter of 01/13/21  CT RENAL STONE STUDY  Narrative CLINICAL DATA:  Right-sided back and flank pain for several  months. Urinoma.  EXAM: CT ABDOMEN AND PELVIS WITHOUT CONTRAST  TECHNIQUE: Multidetector CT imaging of the abdomen and pelvis was performed following the standard protocol without IV contrast.  COMPARISON:  12/03/2020  FINDINGS: Lower chest: No acute findings.  Hepatobiliary: No mass visualized on this unenhanced exam. Gallbladder is unremarkable. No evidence of biliary ductal dilatation.  Pancreas: No mass or inflammatory process visualized on this unenhanced exam.  Spleen:  Within normal limits in size.  Adrenals/Urinary tract: A few tiny less than 5 mm  renal calculi are again seen bilaterally. Left ureteral stent remains in appropriate position, and there is no evidence of hydronephrosis. Previously seen left perinephric and retroperitoneal fluid collection has nearly completely resolved since prior study. Mild diffuse bladder wall thickening is again seen, likely due to chronic bladder outlet obstruction given enlarged prostate.  Stomach/Bowel: Tiny hiatal hernia noted. No evidence of obstruction, inflammatory process, or abnormal fluid collections. Normal appendix visualized.  Vascular/Lymphatic: No pathologically enlarged lymph nodes identified. No evidence of abdominal aortic aneurysm. Aortic atherosclerotic calcification noted.  Reproductive:  Mildly enlarged prostate again noted.  Other:  None.  Musculoskeletal:  No suspicious bone lesions identified.  IMPRESSION: Left ureteral stent in appropriate position. No evidence of hydronephrosis or other acute findings.  Near complete resolution of left perinephric and retroperitoneal fluid collection.  Tiny nonobstructing bilateral renal calculi.  Stable mildly enlarged prostate and findings of chronic bladder outlet obstruction.  Tiny hiatal hernia.  Aortic Atherosclerosis (ICD10-I70.0).   Electronically Signed By: Marlaine Hind M.D. On: 01/14/2021 17:17   Assessment & Plan:    1. Nocturia -We  will start rapaflo 61m qhs., Patient to call in 1 month if he desires rx.  - Urinalysis, Routine w reflex microscopic  2. Nephrolithiasis -RTC 6 months with renal UKorea  No follow-ups on file.  PNicolette Bang MD  [facility]

## 2021-07-12 ENCOUNTER — Encounter: Payer: Self-pay | Admitting: Urology

## 2021-07-12 NOTE — Patient Instructions (Signed)

## 2021-07-20 ENCOUNTER — Telehealth: Payer: Self-pay

## 2021-07-20 ENCOUNTER — Encounter: Payer: Self-pay | Admitting: Physician Assistant

## 2021-07-20 ENCOUNTER — Other Ambulatory Visit: Payer: Self-pay

## 2021-07-20 ENCOUNTER — Ambulatory Visit: Payer: PPO | Admitting: Physician Assistant

## 2021-07-20 VITALS — BP 107/64 | HR 75

## 2021-07-20 DIAGNOSIS — R390 Extravasation of urine: Secondary | ICD-10-CM | POA: Diagnosis not present

## 2021-07-20 DIAGNOSIS — R319 Hematuria, unspecified: Secondary | ICD-10-CM

## 2021-07-20 DIAGNOSIS — N390 Urinary tract infection, site not specified: Secondary | ICD-10-CM | POA: Diagnosis not present

## 2021-07-20 DIAGNOSIS — R300 Dysuria: Secondary | ICD-10-CM | POA: Insufficient documentation

## 2021-07-20 LAB — URINALYSIS, ROUTINE W REFLEX MICROSCOPIC
Bilirubin, UA: NEGATIVE
Glucose, UA: NEGATIVE
Ketones, UA: NEGATIVE
Nitrite, UA: NEGATIVE
Specific Gravity, UA: 1.02 (ref 1.005–1.030)
Urobilinogen, Ur: 0.2 mg/dL (ref 0.2–1.0)
pH, UA: 5.5 (ref 5.0–7.5)

## 2021-07-20 LAB — MICROSCOPIC EXAMINATION
Renal Epithel, UA: NONE SEEN /hpf
WBC, UA: >30 /hpf — AB (ref 0–5)

## 2021-07-20 MED ORDER — SULFAMETHOXAZOLE-TRIMETHOPRIM 800-160 MG PO TABS: 1.0000 | ORAL_TABLET | Freq: Two times a day (BID) | ORAL | 0 refills | Status: AC

## 2021-07-20 NOTE — Telephone Encounter (Signed)
Patients wife called regarding the patient having burning when urinating.

## 2021-07-20 NOTE — Progress Notes (Signed)
07/20/2021 1:59 PM   Jacob Fowler September 07, 1950 [id number]  Referring provider: Loman Fowler, [place],   [id number]  No chief complaint on file.   HPI: 07/20/21 Pt is a 70YO with h/o BPH and nephrolithiasis who presents with new c/o acute onset increase urinary frequency, urgency, burning, and nocturia 3-4x/night. No fever, chills, abdominal pain, nausea, vomiting   UA >30WBC, 3-10RBC, many bacteria  Previous cx from 5/17- sensitive to sulfa, gent, cipro,    07/06/21 Jacob Fowler is a 71yo here for followup for nephrolithiasis and BPH. IPSS 20 QOL 4 on flomax 0.9m daily. He has poor mobility and uses a walker. Renal UKorea11/22/2022 shows no calculi. No other complaints today  PMH: Past Medical History:  Diagnosis Date   Acquired leg length discrepancy    Bladder stone    BPH associated with nocturia    CAD (coronary artery disease) CARDIOLOGIST- Jacob Jacob Fowler--  LAST VISIT 09-27-2011 IN EPIC   Non obstructive Cath 2010-  HX CORONARY SPASM 2004   Cataract    bilateral    GERD (gastroesophageal reflux disease)    H/O hiatal hernia    Heart murmur    Heartburn    High cholesterol    History of nephrolithiasis 2009   Hx of adenomatous colonic polyps    Hyperlipidemia    Hypertension    Movement disorder    [place]  10[place]---  NEUROLOGIST  Jacob Fowler ([place])   RIGHT SIDE AFFECTED MORE W/ WEAKNESS- - USES CANE   Nephrolithiasis 08/30/2020   Neuromuscular disorder (HCC)    MS   NIDDM (non-insulin dependent diabetes mellitus)    Peyronie's disease    RBBB    Renal stone RIGHT   Rosacea    Vision abnormalities     Surgical History: Past Surgical History:  Procedure Laterality Date   CARDIAC CATHETERIZATION  02-26-2009 ---  Jacob TIda Fowler  NONOBSTRUCTIVE CAD/ 50% DISTAL RCA/ 30% LAD / NORMAL LVF   CYSTOSCOPY W/ RETROGRADES  10/30/2011   Procedure: CYSTOSCOPY WITH RETROGRADE PYELOGRAM;  Surgeon: Jacob Jabs MD;  [place] [facility]  Service: Urology;  Laterality: Right;   CYSTOSCOPY WITH LITHOLAPAXY  10/30/2011   Procedure: CYSTOSCOPY WITH LITHOLAPAXY;  Surgeon: Jacob Jabs MD;  [facility]  Service: Urology;  Laterality: N/A;   CYSTOSCOPY WITH RETROGRADE PYELOGRAM, URETEROSCOPY AND STENT PLACEMENT Left 11/25/2020   Procedure: CYSTOSCOPY WITH LEFT RETROGRADE PYELOGRAM, LEFT URETEROSCOPY WITH LASER AND LEFT URETERAL STENT PLACEMENT;  Surgeon: MCleon Gustin MD;  [facility];  Service: Urology;  Laterality: Left;   EXTRACORPOREAL SHOCK WAVE LITHOTRIPSY  09-05-2010   RIGHT   EYE SURGERY Right 08/30/2018   HOLMIUM LASER APPLICATION Left 52/35/3614  Procedure: HOLMIUM LASER APPLICATION;  Surgeon: MCleon Gustin MD;  [facility];  Service: Urology;  Laterality: Left;   NESBIT PROCEDURE  10-17-2004   CORRECTION OF PENILE ANGULATION (PEYRONIES DISEASE)    Home Medications:  Allergies as of 07/20/2021   No Known Allergies      Medication List        Accurate as of July 20, 2021  1:59 PM. If you have any questions, ask your nurse or doctor.          alfuzosin 10 MG 24 hr tablet Commonly known as: UROXATRAL Take 1 tablet (10 mg total) by mouth in the morning and at bedtime.   aspirin  81 MG EC tablet Take 81 mg by mouth daily.   Betaseron 0.3 MG Kit injection Generic drug: Interferon Beta-1b Inject 0.25 mg into the skin every other day. What changed:  how much to take when to take this   cholecalciferol 1000 units tablet Commonly known as: VITAMIN D Take 1,000 Units by mouth daily.   clotrimazole-betamethasone cream Commonly known as: Lotrisone Apply 1 application topically 2 (two) times daily.   cyanocobalamin 1000 MCG/ML injection Commonly known as: (VITAMIN B-12) INJECT 1ML INTO THE SKIN ONCE MONTH What changed:  how much to take how to take this when to take this additional instructions    fish oil-omega-3 fatty acids 1000 MG capsule Take 1 g by mouth at bedtime.   glucose blood test strip Use as instructed   isosorbide mononitrate 60 MG 24 hr tablet Commonly known as: IMDUR Take 0.5 tablets (30 mg total) by mouth daily.   levothyroxine 75 MCG tablet Commonly known as: SYNTHROID TAKE ONE TABLET (75MCG TOTAL) BY MOUTH DAILY BEFORE BREAKFAST   lisinopril 10 MG tablet Commonly known as: ZESTRIL Take 1 tablet (10 mg total) by mouth daily.   metFORMIN 1000 MG tablet Commonly known as: GLUCOPHAGE TAKE ONE TABLET (1000MG TOTAL) BY MOUTH TWICE DAILY WITH A MEAL   omeprazole 20 MG capsule Commonly known as: PRILOSEC Take 1 capsule (20 mg total) by mouth daily.   pioglitazone 30 MG tablet Commonly known as: ACTOS Take 1 tablet (30 mg total) by mouth daily.   rosuvastatin 10 MG tablet Commonly known as: CRESTOR TAKE ONE TABLET (10MG TOTAL) BY MOUTH ATBEDTIME   silodosin 8 MG Caps capsule Commonly known as: RAPAFLO Take 1 capsule (8 mg total) by mouth at bedtime.   sulfamethoxazole-trimethoprim 800-160 MG tablet Commonly known as: BACTRIM DS Take 1 tablet by mouth 2 (two) times daily for 10 days. Started by: Jacob Minium, PA-C   SYRINGE-NEEDLE (DISP) 3 ML 25G X 5/8" 3 ML Misc Commonly known as: B-D SYRINGE/NEEDLE 3CC/25GX5/8 Use to give once a month B12 injections   tamsulosin 0.4 MG Caps capsule Commonly known as: FLOMAX Take 1 capsule (0.4 mg total) by mouth daily after supper.        Allergies: No Known Allergies  Family History: Family History  Problem Relation Age of Onset   Stroke Mother    Heart attack Mother    Diabetes Mother    Hypertension Mother    Renal cancer Father    Diabetes Sister    Heart attack Brother    Diabetes Son    Heart attack Maternal Grandfather    Heart attack Brother    Bladder Cancer Brother     Social History:  reports that he quit smoking about 47 years ago. His smoking use included  cigarettes. He has a 7.00 pack-year smoking history. His smokeless tobacco use includes chew. He reports that he does not drink alcohol and does not use drugs.  ROS: All other review of systems were reviewed and are negative except what is noted above in HPI  Physical Exam: BP 107/64    Pulse 75   Constitutional:  Alert and oriented, No acute distress. Wheelchair mobilization HEENT: [place] AT, moist mucus membranes.  Trachea midline, no masses. Cardiovascular: No clubbing, cyanosis, or edema. Respiratory: Normal respiratory effort, no increased work of breathing. GI: Abdomen is soft, nontender, nondistended, no abdominal masses GU: Mild right sided CVA tenderness.  skin: No rashes, bruises or suspicious lesions. Neurologic: Grossly intact, no focal deficits, moving  all 4 extremities. Psychiatric: Normal mood and affect.  Laboratory Data: Lab Results  Component Value Date   WBC 7.2 06/01/2021   HGB 10.9 (L) 06/01/2021   HCT 35.1 (L) 06/01/2021   MCV 82 06/01/2021   PLT 222 06/01/2021    Lab Results  Component Value Date   CREATININE 1.20 06/01/2021    Lab Results  Component Value Date   PSA 5.5 (H) 01/28/2014   PSA 3.37 01/06/2013   PSA 3.08 10/14/2012    No results found for: TESTOSTERONE  Lab Results  Component Value Date   HGBA1C 6.8 (H) 06/01/2021    Urinalysis    Component Value Date/Time   COLORURINE YELLOW 11/30/2020 0914   APPEARANCEUR Cloudy (A) 01/14/2021 1122   LABSPEC 1.016 11/30/2020 0914   PHURINE 6.0 11/30/2020 0914   GLUCOSEU Negative 01/14/2021 1122   HGBUR LARGE (A) 11/30/2020 0914   BILIRUBINUR Negative 01/14/2021 1122   KETONESUR NEGATIVE 11/30/2020 0914   PROTEINUR 3+ (A) 01/14/2021 1122   PROTEINUR 30 (A) 11/30/2020 0914   UROBILINOGEN negative 12/18/2014 1601   UROBILINOGEN 1.0 04/06/2010 0509   NITRITE Negative 01/14/2021 1122   NITRITE NEGATIVE 11/30/2020 0914   LEUKOCYTESUR 3+ (A) 01/14/2021 1122   LEUKOCYTESUR MODERATE (A)  11/30/2020 0914    Lab Results  Component Value Date   LABMICR See below: 01/14/2021   WBCUA >30 (A) 01/14/2021   RBCUA >30 (A) 08/07/2018   LABEPIT None seen 01/14/2021   MUCUS Present 01/14/2021   BACTERIA Few 01/14/2021    Pertinent Imaging: No imaging today Results for orders placed during the hospital encounter of 10/30/11  KUB day of procedure  Narrative *RADIOLOGY REPORT*  Clinical Data: Right ureteral stone  ABDOMEN - 1 VIEW  Comparison: [facility] CT urogram dated 10/17/2011  Findings: Prior distal right ureteral calculus is not definitely visualized on the current radiograph.  Known nonobstructing right lower pole calculi are also not radiographically apparent.  Calcified phleboliths in the pelvis.  Linear calcification overlying the right sacrum corresponds to a vascular calcification.  Suspected radiopaque ingested tablets in the splenic flexure, sigmoid colon (overlying superior bladder), and cecum.  Degenerative changes of the visualized thoracolumbar spine.  IMPRESSION: Prior distal right ureteral calculus is not definitely visualized on the current radiograph.  Known nonobstructing right lower pole calculi are also not radiographically apparent.  Original Report Authenticated By: Julian Hy, M.D.  No results found for this or any previous visit.  No results found for this or any previous visit.  No results found for this or any previous visit.  Results for orders placed during the hospital encounter of 06/07/21  US RENAL  Narrative CLINICAL DATA:  Nephrolithiasis  EXAM: RENAL / URINARY TRACT ULTRASOUND COMPLETE  COMPARISON:  CT 01/13/2021, ultrasound 03/04/2021  FINDINGS: Right Kidney:  Renal measurements: 9.2 x 4.8 x 5.2 cm = volume: 119.8 mL. Echogenicity within normal limits. No mass or hydronephrosis visualized.  Left Kidney:  Renal measurements: 10.3 x 5.1 x 5.4 cm = volume: 147.4 mL. Echogenicity within  normal limits. No mass or hydronephrosis visualized.  Bladder:  Appears normal for degree of bladder distention.  Other:  None.  IMPRESSION: Negative renal ultrasound   Electronically Signed By: Donavan Foil M.D. On: 06/07/2021 21:52  No results found for this or any previous visit.  No results found for this or any previous visit.  Results for orders placed during the hospital encounter of 01/13/21  CT RENAL STONE STUDY  Narrative CLINICAL DATA:  Right-sided  back and flank pain for several months. Urinoma.  EXAM: CT ABDOMEN AND PELVIS WITHOUT CONTRAST  TECHNIQUE: Multidetector CT imaging of the abdomen and pelvis was performed following the standard protocol without IV contrast.  COMPARISON:  12/03/2020  FINDINGS: Lower chest: No acute findings.  Hepatobiliary: No mass visualized on this unenhanced exam. Gallbladder is unremarkable. No evidence of biliary ductal dilatation.  Pancreas: No mass or inflammatory process visualized on this unenhanced exam.  Spleen:  Within normal limits in size.  Adrenals/Urinary tract: A few tiny less than 5 mm renal calculi are again seen bilaterally. Left ureteral stent remains in appropriate position, and there is no evidence of hydronephrosis. Previously seen left perinephric and retroperitoneal fluid collection has nearly completely resolved since prior study. Mild diffuse bladder wall thickening is again seen, likely due to chronic bladder outlet obstruction given enlarged prostate.  Stomach/Bowel: Tiny hiatal hernia noted. No evidence of obstruction, inflammatory process, or abnormal fluid collections. Normal appendix visualized.  Vascular/Lymphatic: No pathologically enlarged lymph nodes identified. No evidence of abdominal aortic aneurysm. Aortic atherosclerotic calcification noted.  Reproductive:  Mildly enlarged prostate again noted.  Other:  None.  Musculoskeletal:  No suspicious bone lesions  identified.  IMPRESSION: Left ureteral stent in appropriate position. No evidence of hydronephrosis or other acute findings.  Near complete resolution of left perinephric and retroperitoneal fluid collection.  Tiny nonobstructing bilateral renal calculi.  Stable mildly enlarged prostate and findings of chronic bladder outlet obstruction.  Tiny hiatal hernia.  Aortic Atherosclerosis (ICD10-I70.0).   Electronically Signed By: Marlaine Hind M.D. On: 01/14/2021 17:17   Assessment & Plan:   Extravasation of urine - Plan: Urinalysis, Routine w reflex microscopic  Dysuria - Plan: Urine culture  Urinary tract infection with hematuria, site unspecified Bactrim DS x 10 days sent to pt pharmacy. Culture pending  Keep regular appt as previously scheduled for fu of nephrolithiasis  FU scheduled in March for stones and renal US  [place], Berneice Heinrich, [facility] [place]

## 2021-07-20 NOTE — Progress Notes (Signed)
Urological Symptom Review  Patient is experiencing the following symptoms: Frequent urination Hard to postpone urination Burning/pain with urination Get up at night to urinate   Review of Systems  Gastrointestinal (upper)  : Negative for upper GI symptoms  Gastrointestinal (lower) : Negative for lower GI symptoms  Constitutional : Negative for symptoms  Skin: Negative for skin symptoms  Eyes: Negative for eye symptoms  Ear/Nose/Throat : Negative for Ear/Nose/Throat symptoms  Hematologic/Lymphatic: Negative for Hematologic/Lymphatic symptoms  Cardiovascular : Negative for cardiovascular symptoms  Respiratory : Negative for respiratory symptoms  Endocrine: Negative for endocrine symptoms  Musculoskeletal: Negative for musculoskeletal symptoms  Neurological: Negative for neurological symptoms  Psychologic: Negative for psychiatric symptoms

## 2021-07-22 LAB — URINE CULTURE

## 2021-07-25 ENCOUNTER — Other Ambulatory Visit: Payer: Self-pay | Admitting: Family Medicine

## 2021-07-25 ENCOUNTER — Telehealth: Payer: Self-pay

## 2021-07-25 DIAGNOSIS — K219 Gastro-esophageal reflux disease without esophagitis: Secondary | ICD-10-CM

## 2021-07-25 DIAGNOSIS — E1169 Type 2 diabetes mellitus with other specified complication: Secondary | ICD-10-CM

## 2021-07-25 DIAGNOSIS — I152 Hypertension secondary to endocrine disorders: Secondary | ICD-10-CM

## 2021-07-25 DIAGNOSIS — N200 Calculus of kidney: Secondary | ICD-10-CM

## 2021-07-25 DIAGNOSIS — E119 Type 2 diabetes mellitus without complications: Secondary | ICD-10-CM

## 2021-07-25 NOTE — Telephone Encounter (Signed)
-----   Message from Reynaldo Minium, [place] sent at 07/25/2021 12:03 PM EST ----- Pt cx indicates minimal growth. Pt needs to complete the Bactrim Rx and fu as scheduled in 6 months.  ----- Message ----- From: Interface, [facility] Lab Results In Sent: 07/20/2021   3:17 PM EST To: Berneice Heinrich [place], PA-C

## 2021-07-25 NOTE — Telephone Encounter (Signed)
Patient called and given results. Voiced understanding.   Patient to keep scheduled 79month ov with renal u/s prior. Per Dr. Alyson Ingles, okay to cancel March appt at this time.   Appt mailed to patient.

## 2021-07-27 ENCOUNTER — Other Ambulatory Visit: Payer: Self-pay | Admitting: Family Medicine

## 2021-07-27 DIAGNOSIS — R39198 Other difficulties with micturition: Secondary | ICD-10-CM

## 2021-07-27 DIAGNOSIS — I152 Hypertension secondary to endocrine disorders: Secondary | ICD-10-CM

## 2021-07-27 DIAGNOSIS — E119 Type 2 diabetes mellitus without complications: Secondary | ICD-10-CM

## 2021-08-12 ENCOUNTER — Ambulatory Visit: Payer: PPO | Admitting: Physician Assistant

## 2021-08-12 ENCOUNTER — Other Ambulatory Visit: Payer: Self-pay

## 2021-08-12 VITALS — BP 116/69 | HR 85 | Temp 98.3°F

## 2021-08-12 DIAGNOSIS — R300 Dysuria: Secondary | ICD-10-CM | POA: Diagnosis not present

## 2021-08-12 DIAGNOSIS — R319 Hematuria, unspecified: Secondary | ICD-10-CM

## 2021-08-12 DIAGNOSIS — N390 Urinary tract infection, site not specified: Secondary | ICD-10-CM

## 2021-08-12 LAB — MICROSCOPIC EXAMINATION: Renal Epithel, UA: NONE SEEN /hpf

## 2021-08-12 LAB — URINALYSIS, ROUTINE W REFLEX MICROSCOPIC
Bilirubin, UA: NEGATIVE
Ketones, UA: NEGATIVE
Nitrite, UA: POSITIVE — AB
Specific Gravity, UA: 1.02 (ref 1.005–1.030)
Urobilinogen, Ur: 0.2 mg/dL (ref 0.2–1.0)
pH, UA: 5 (ref 5.0–7.5)

## 2021-08-12 LAB — BLADDER SCAN AMB NON-IMAGING: Scan Result: 61

## 2021-08-12 MED ORDER — SULFAMETHOXAZOLE-TRIMETHOPRIM 800-160 MG PO TABS: 1.0000 | ORAL_TABLET | Freq: Two times a day (BID) | ORAL | 0 refills | Status: DC

## 2021-08-12 NOTE — Progress Notes (Signed)
post void residual=61  Urological Symptom Review  Patient is experiencing the following symptoms: Frequent urination Hard to postpone urination Burning/pain with urination   Review of Systems  Gastrointestinal (upper)  : Negative for upper GI symptoms  Gastrointestinal (lower) : Negative for lower GI symptoms  Constitutional : Fever  Skin: Negative for skin symptoms  Eyes: Negative for eye symptoms  Ear/Nose/Throat : Negative for Ear/Nose/Throat symptoms  Hematologic/Lymphatic: Negative for Hematologic/Lymphatic symptoms  Cardiovascular : Negative for cardiovascular symptoms  Respiratory : Negative for respiratory symptoms  Endocrine: Negative for endocrine symptoms  Musculoskeletal: Negative for musculoskeletal symptoms  Neurological: Negative for neurological symptoms  Psychologic: Negative for psychiatric symptoms

## 2021-08-12 NOTE — Progress Notes (Signed)
Assessment: 1. Urinary tract infection with hematuria, site unspecified   2. Dysuria     Plan: Will start Bactrim today and ResolveMDx culture ordered. Will adjust antibx pending result. Pt to fu in 2 weeks for cysto with Dr. Alyson Ingles. Pending cx result, will continue antibx until cysto appt. Strict ED precautions given if pt develops sxs of worsening infection. Continue Rapaflo.  Chief Complaint: Urinary burning  HPI: Jacob Fowler is a 71 y.o. male who presents for evaluation of burning with urination. 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 Pt also reports low grade fevers up to 100.0 for the past 2 days. He has no other sx of UTI and denies frequency, urgency, hematuria, chills, body aches, abdominal pain, nausea or vomiting. Pt was tx for UTI on 1/4 and sxs improved minimally before worsening this week. Renal US 06/07/21 showed no stone burden. CT stone study 01/13/21 showed a few bilateral tiny renal stones. No hydro. The pt is currently on Rapaflo only and has Dc'd Uroxotral and tamsulosin.  PVR= 49ml UA= >30WBC, many bacteria, nitrite positive  Urine Culture: 08/07/18 No growth 11/30/20 Enterobacter Aerogenes Sensitive only to cefepime, Cipro, Gent,      Imipenem,and Trimeth/Sulfa 12/29/20 Mixed flora 10-25K colonies 07/20/21 25-50K mixed flora  07/06/21 Mr Jacob Fowler is a 71yo here for followup for nephrolithiasis and BPH. IPSS 20 QOL 4 on flomax 0.4mg  daily. He has poor mobility and uses a walker. Renal US 06/07/2021 shows no calculi. No other complaints today  07/20/21 Pt is a 70YO with h/o BPH and nephrolithiasis who presents with new c/o acute onset increase urinary frequency, urgency, burning, and nocturia 3-4x/night. No fever, chills, abdominal pain, nausea, vomiting    UA >30WBC, 3-10RBC, many bacteria   Previous cx from 11/30/20- sensitive to sulfa, gent, cipro     Portions of  the above documentation were copied from a prior visit for review purposes only.  Allergies: No Known Allergies  PMH: Past Medical History:  Diagnosis Date   Acquired leg length discrepancy    Bladder stone    BPH associated with nocturia    CAD (coronary artery disease) CARDIOLOGIST- DR HOCHREIN--  LAST VISIT 09-27-2011 IN EPIC   Non obstructive Cath 2010-  HX CORONARY SPASM 2004   Cataract    bilateral    GERD (gastroesophageal reflux disease)    H/O hiatal hernia    Heart murmur    Heartburn    High cholesterol    History of nephrolithiasis 2009   Hx of adenomatous colonic polyps    Hyperlipidemia    Hypertension    Movement disorder    Multiple sclerosis ([place]) DX  10/10---  NEUROLOGIST  DR Delfino Lovett [facility] ([place])   RIGHT SIDE AFFECTED MORE W/ WEAKNESS- - USES CANE   Nephrolithiasis 08/30/2020   Neuromuscular disorder ([facility])    MS   NIDDM (non-insulin dependent diabetes mellitus)    Peyronie's disease    RBBB    Renal stone RIGHT   Rosacea    Vision abnormalities     PSH: 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 / NORMAL LVF   CYSTOSCOPY W/ RETROGRADES  10/30/2011   Procedure: CYSTOSCOPY WITH RETROGRADE PYELOGRAM;  Surgeon: Claybon Jabs, MD;  Location: [facility];  Service: Urology;  Laterality: Right;  CYSTOSCOPY WITH LITHOLAPAXY  10/30/2011   Procedure: CYSTOSCOPY WITH LITHOLAPAXY;  Surgeon: Claybon Jabs, MD;  Location: [facility];  Service: Urology;  Laterality: N/A;   CYSTOSCOPY WITH RETROGRADE PYELOGRAM, URETEROSCOPY AND STENT PLACEMENT Left 11/25/2020   Procedure: CYSTOSCOPY WITH LEFT RETROGRADE PYELOGRAM, LEFT URETEROSCOPY WITH LASER AND LEFT URETERAL STENT PLACEMENT;  Surgeon: Cleon Gustin, MD;  Location: [facility];  Service: Urology;  Laterality: Left;   EXTRACORPOREAL SHOCK WAVE LITHOTRIPSY  09-05-2010    RIGHT   EYE SURGERY Right 08/30/2018   HOLMIUM LASER APPLICATION Left 1/61/0960   Procedure: HOLMIUM LASER APPLICATION;  Surgeon: Cleon Gustin, MD;  Location: [facility];  Service: Urology;  Laterality: Left;   NESBIT PROCEDURE  10-17-2004   CORRECTION OF PENILE ANGULATION (PEYRONIES DISEASE)    SH: Social History   Tobacco Use   Smoking status: Former    Packs/day: 1.00    Years: 7.00    Pack years: 7.00    Types: Cigarettes    Quit date: 09/26/1973    Years since quitting: 47.9   Smokeless tobacco: Current    Types: Chew   Tobacco comments:    Chewing tobacco  Vaping Use   Vaping Use: Never used  Substance Use Topics   Alcohol use: No    Alcohol/week: 0.0 standard drinks   Drug use: No    ROS: Constitutional:  Negative for fever, chills, weight loss CV: Negative for chest pain, previous MI, hypertension Respiratory:  Negative for shortness of breath, wheezing, sleep apnea, frequent cough GI:  Negative for nausea, vomiting, bloody stool, GERD  PE: BP 116/69    Pulse 85  GENERAL APPEARANCE:  Well appearing, well developed, well nourished, NAD HEENT:  Atraumatic, normocephalic NECK:  Supple. Trachea midline ABDOMEN:  Soft, non-tender, no masses EXTREMITIES:  Moves all extremities well, without clubbing, cyanosis, or edema NEUROLOGIC:  Alert and oriented x 3, in wheelchair due to MS MENTAL STATUS:  appropriate BACK:  Non-tender to palpation, Mild right sided CVAT SKIN:  Warm, dry, and intact   Results: Laboratory Data: Lab Results  Component Value Date   WBC 7.2 06/01/2021   HGB 10.9 (L) 06/01/2021   HCT 35.1 (L) 06/01/2021   MCV 82 06/01/2021   PLT 222 06/01/2021    Lab Results  Component Value Date   CREATININE 1.20 06/01/2021    Lab Results  Component Value Date   PSA 5.5 (H) 01/28/2014   PSA 3.37 01/06/2013   PSA 3.08 10/14/2012    No results found for: TESTOSTERONE  Lab Results  Component Value Date   HGBA1C 6.8 (H) 06/01/2021     Urinalysis    Component Value Date/Time   COLORURINE YELLOW 11/30/2020 0914   APPEARANCEUR Cloudy (A) 07/20/2021 1346   LABSPEC 1.016 11/30/2020 0914   PHURINE 6.0 11/30/2020 0914   GLUCOSEU Negative 07/20/2021 1346   HGBUR LARGE (A) 11/30/2020 0914   BILIRUBINUR Negative 07/20/2021 1346   KETONESUR NEGATIVE 11/30/2020 0914   PROTEINUR 2+ (A) 07/20/2021 1346   PROTEINUR 30 (A) 11/30/2020 0914   UROBILINOGEN negative 12/18/2014 1601   UROBILINOGEN 1.0 04/06/2010 0509   NITRITE Negative 07/20/2021 1346   NITRITE NEGATIVE 11/30/2020 0914   LEUKOCYTESUR 3+ (A) 07/20/2021 1346   LEUKOCYTESUR MODERATE (A) 11/30/2020 0914    Lab Results  Component Value Date   LABMICR See below: 07/20/2021   WBCUA >30 (A) 07/20/2021   RBCUA >30 (A) 08/07/2018   LABEPIT 0-10 07/20/2021   MUCUS Present 01/14/2021  BACTERIA Many (A) 07/20/2021    Pertinent Imaging:  Results for orders placed during the hospital encounter of 10/30/11  KUB day of procedure  Narrative *RADIOLOGY REPORT*  Clinical Data: Right ureteral stone  ABDOMEN - 1 VIEW  Comparison: [facility] CT urogram dated 10/17/2011  Findings: Prior distal right ureteral calculus is not definitely visualized on the current radiograph.  Known nonobstructing right lower pole calculi are also not radiographically apparent.  Calcified phleboliths in the pelvis.  Linear calcification overlying the right sacrum corresponds to a vascular calcification.  Suspected radiopaque ingested tablets in the splenic flexure, sigmoid colon (overlying superior bladder), and cecum.  Degenerative changes of the visualized thoracolumbar spine.  IMPRESSION: Prior distal right ureteral calculus is not definitely visualized on the current radiograph.  Known nonobstructing right lower pole calculi are also not radiographically apparent.  Original Report Authenticated By: Julian Hy, M.D.  No results found for this or any  previous visit.  No results found for this or any previous visit.  No results found for this or any previous visit.  Results for orders placed during the hospital encounter of 06/07/21  US RENAL  Narrative CLINICAL DATA:  Nephrolithiasis  EXAM: RENAL / URINARY TRACT ULTRASOUND COMPLETE  COMPARISON:  CT 01/13/2021, ultrasound 03/04/2021  FINDINGS: Right Kidney:  Renal measurements: 9.2 x 4.8 x 5.2 cm = volume: 119.8 mL. Echogenicity within normal limits. No mass or hydronephrosis visualized.  Left Kidney:  Renal measurements: 10.3 x 5.1 x 5.4 cm = volume: 147.4 mL. Echogenicity within normal limits. No mass or hydronephrosis visualized.  Bladder:  Appears normal for degree of bladder distention.  Other:  None.  IMPRESSION: Negative renal ultrasound   Electronically Signed By: Donavan Foil M.D. On: 06/07/2021 21:52  No results found for this or any previous visit.  No results found for this or any previous visit.  Results for orders placed during the hospital encounter of 01/13/21  CT RENAL STONE STUDY  Narrative CLINICAL DATA:  Right-sided back and flank pain for several months. Urinoma.  EXAM: CT ABDOMEN AND PELVIS WITHOUT CONTRAST  TECHNIQUE: Multidetector CT imaging of the abdomen and pelvis was performed following the standard protocol without IV contrast.  COMPARISON:  12/03/2020  FINDINGS: Lower chest: No acute findings.  Hepatobiliary: No mass visualized on this unenhanced exam. Gallbladder is unremarkable. No evidence of biliary ductal dilatation.  Pancreas: No mass or inflammatory process visualized on this unenhanced exam.  Spleen:  Within normal limits in size.  Adrenals/Urinary tract: A few tiny less than 5 mm renal calculi are again seen bilaterally. Left ureteral stent remains in appropriate position, and there is no evidence of hydronephrosis. Previously seen left perinephric and retroperitoneal fluid collection  has nearly completely resolved since prior study. Mild diffuse bladder wall thickening is again seen, likely due to chronic bladder outlet obstruction given enlarged prostate.  Stomach/Bowel: Tiny hiatal hernia noted. No evidence of obstruction, inflammatory process, or abnormal fluid collections. Normal appendix visualized.  Vascular/Lymphatic: No pathologically enlarged lymph nodes identified. No evidence of abdominal aortic aneurysm. Aortic atherosclerotic calcification noted.  Reproductive:  Mildly enlarged prostate again noted.  Other:  None.  Musculoskeletal:  No suspicious bone lesions identified.  IMPRESSION: Left ureteral stent in appropriate position. No evidence of hydronephrosis or other acute findings.  Near complete resolution of left perinephric and retroperitoneal fluid collection.  Tiny nonobstructing bilateral renal calculi.  Stable mildly enlarged prostate and findings of chronic bladder outlet obstruction.  Tiny hiatal hernia.  Aortic Atherosclerosis (ICD10-I70.0).  Electronically Signed By: Marlaine Hind M.D. On: 01/14/2021 17:17  No results found for this or any previous visit (from the past 24 hour(s)).

## 2021-08-12 NOTE — Progress Notes (Signed)
Resolve MDX urine test sent Tracking number [id number] SCBI up number [id number]

## 2021-08-15 ENCOUNTER — Other Ambulatory Visit: Payer: Self-pay | Admitting: Physician Assistant

## 2021-08-15 ENCOUNTER — Other Ambulatory Visit: Payer: Self-pay | Admitting: Family Medicine

## 2021-08-15 DIAGNOSIS — E119 Type 2 diabetes mellitus without complications: Secondary | ICD-10-CM

## 2021-08-16 ENCOUNTER — Telehealth: Payer: Self-pay

## 2021-08-16 ENCOUNTER — Other Ambulatory Visit: Payer: Self-pay | Admitting: Physician Assistant

## 2021-08-16 MED ORDER — FLUCONAZOLE 100 MG PO TABS: 100.0000 mg | ORAL_TABLET | Freq: Every day | ORAL | 0 refills | Status: AC

## 2021-08-16 NOTE — Telephone Encounter (Signed)
-----   Message from Reynaldo Minium, [place] sent at 08/16/2021 11:02 AM EST ----- Please let Mr Duddy know his urine cx grew candida and he needs tx with anti-yeast medication. I will send  Rx for Diflucan to his pharmacy. He may stop the Bactrim antibx. He may continue to use clotrimazole cream as needed if he is having symptoms of external yeast balanitis. Important to continue keeping good control of his glucose levels  ----- Message ----- From: Iris Pert, LPN Sent: 1/61/0960   3:03 PM EST To: Reynaldo Minium, PA-C  MDX test results

## 2021-08-16 NOTE — Progress Notes (Signed)
Urine cx + for candida. Pt advised to DC antibx and start Diflucan. May continue using cotrimazole.

## 2021-08-16 NOTE — Telephone Encounter (Signed)
Patient called and made aware.

## 2021-08-17 ENCOUNTER — Other Ambulatory Visit: Payer: Self-pay | Admitting: Urology

## 2021-09-14 ENCOUNTER — Other Ambulatory Visit: Payer: Self-pay

## 2021-09-14 ENCOUNTER — Ambulatory Visit: Payer: PPO | Admitting: Urology

## 2021-09-14 ENCOUNTER — Encounter: Payer: Self-pay | Admitting: Urology

## 2021-09-14 VITALS — BP 121/70 | HR 87 | Wt 154.0 lb

## 2021-09-14 DIAGNOSIS — R319 Hematuria, unspecified: Secondary | ICD-10-CM | POA: Diagnosis not present

## 2021-09-14 DIAGNOSIS — N390 Urinary tract infection, site not specified: Secondary | ICD-10-CM

## 2021-09-14 DIAGNOSIS — R300 Dysuria: Secondary | ICD-10-CM

## 2021-09-14 MED ORDER — CIPROFLOXACIN HCL 500 MG PO TABS
500.0000 mg | ORAL_TABLET | Freq: Once | ORAL | Status: AC
  Administered 2021-09-14: 500 mg via ORAL

## 2021-09-14 MED ORDER — FINASTERIDE 5 MG PO TABS: 5.0000 mg | ORAL_TABLET | Freq: Every day | ORAL | 3 refills | Status: DC

## 2021-09-14 MED ORDER — SILODOSIN 8 MG PO CAPS: 8.0000 mg | ORAL_CAPSULE | Freq: Every day | ORAL | 11 refills | Status: DC

## 2021-09-14 NOTE — Progress Notes (Signed)
? ?  09/14/21 ? ?CC: followup BPH ? ? ?HPI: ?Jacob Fowler is a 71yo here for followup for worsening LUTS on rapaflo 8mg  daily. ?Blood pressure 121/70, pulse 87, weight 154 lb (69.9 kg). ?NED. A&Ox3.   ?No respiratory distress   ?Abd soft, NT, ND ?Normal phallus with bilateral descended testicles ? ?Cystoscopy Procedure Note ? ?Patient identification was confirmed, informed consent was obtained, and patient was prepped using Betadine solution.  Lidocaine jelly was administered per urethral meatus.   ? ? ?Pre-Procedure: ?- Inspection reveals a normal caliber ureteral meatus. ? ?Procedure: ?The flexible cystoscope was introduced without difficulty ?- No urethral strictures/lesions are present. ?- Enlarged prostate enlarged median lobe ?- Elevated bladder neck ?- Bilateral ureteral orifices identified ?- Bladder mucosa  reveals no ulcers, tumors, or lesions ?- No bladder stones ?- No trabeculation ? ?Retroflexion shows 2cm intravesical prostatic protrusion ? ? ?Post-Procedure: ?- Patient tolerated the procedure well ? ?Assessment/ Plan: ?We will start finasteride 5mg  daily and continue rapaflo 8mg  daily. RTC 3 months. If his LUTS worsen we will likely proceed with TURP ? ?No follow-ups on file. ? ?Nicolette Bang, MD  ?  ?

## 2021-09-14 NOTE — Patient Instructions (Signed)

## 2021-09-15 DIAGNOSIS — R300 Dysuria: Secondary | ICD-10-CM | POA: Diagnosis not present

## 2021-09-15 LAB — URINALYSIS, ROUTINE W REFLEX MICROSCOPIC
Bilirubin, UA: NEGATIVE
Glucose, UA: NEGATIVE
Ketones, UA: NEGATIVE
Leukocytes,UA: NEGATIVE
Nitrite, UA: NEGATIVE
RBC, UA: NEGATIVE
Specific Gravity, UA: >1.03 — ABNORMAL HIGH (ref 1.005–1.030)
Urobilinogen, Ur: 0.2 mg/dL (ref 0.2–1.0)
pH, UA: 6 (ref 5.0–7.5)

## 2021-10-03 ENCOUNTER — Telehealth: Payer: Self-pay

## 2021-10-03 ENCOUNTER — Other Ambulatory Visit: Payer: PPO

## 2021-10-03 ENCOUNTER — Other Ambulatory Visit: Payer: Self-pay

## 2021-10-03 DIAGNOSIS — N390 Urinary tract infection, site not specified: Secondary | ICD-10-CM

## 2021-10-03 DIAGNOSIS — R319 Hematuria, unspecified: Secondary | ICD-10-CM | POA: Diagnosis not present

## 2021-10-03 LAB — MICROSCOPIC EXAMINATION
RBC, Urine: >30 /hpf — AB (ref 0–2)
Renal Epithel, UA: NONE SEEN /hpf
WBC, UA: >30 /hpf — AB (ref 0–5)

## 2021-10-03 LAB — URINALYSIS, ROUTINE W REFLEX MICROSCOPIC
Bilirubin, UA: NEGATIVE
Glucose, UA: NEGATIVE
Ketones, UA: NEGATIVE
Nitrite, UA: NEGATIVE
Specific Gravity, UA: 1.02 (ref 1.005–1.030)
Urobilinogen, Ur: 0.2 mg/dL (ref 0.2–1.0)
pH, UA: 5.5 (ref 5.0–7.5)

## 2021-10-03 MED ORDER — NITROFURANTOIN MONOHYD MACRO 100 MG PO CAPS
100.0000 mg | ORAL_CAPSULE | Freq: Two times a day (BID) | ORAL | 0 refills | Status: DC
Start: 2021-10-03 — End: 2021-11-30

## 2021-10-03 NOTE — Telephone Encounter (Signed)
Patient in office today for  urine drop off. ?Ua reviewed with Dr. Alyson Ingles. ?Urine sent for culture.  ?Patient to start on Macrobid bid x 7 days. ?Wife called and aware. ?

## 2021-10-03 NOTE — Telephone Encounter (Signed)
Patients daughter called and advised patient was having pain when urinating along with cloudy urine. Patient unable to come in and see provider due to [place].  ?

## 2021-10-03 NOTE — Progress Notes (Unsigned)
Ua reviewed with Dr. Alyson Ingles. ?Urine sent for culture.  ?Patient to start on Macrobid bid x 7 days. ?Wife called and aware. ?

## 2021-10-05 LAB — URINE CULTURE

## 2021-10-10 ENCOUNTER — Ambulatory Visit: Payer: PPO | Admitting: Urology

## 2021-11-10 ENCOUNTER — Other Ambulatory Visit: Payer: Self-pay | Admitting: Family Medicine

## 2021-11-10 NOTE — Telephone Encounter (Signed)
LOV- 06/01/21 ?NOV- 11/30/21 ?

## 2021-11-21 ENCOUNTER — Ambulatory Visit (INDEPENDENT_AMBULATORY_CARE_PROVIDER_SITE_OTHER): Payer: PPO

## 2021-11-21 VITALS — Wt 154.0 lb

## 2021-11-21 DIAGNOSIS — Z0000 Encounter for general adult medical examination without abnormal findings: Secondary | ICD-10-CM | POA: Diagnosis not present

## 2021-11-21 NOTE — Patient Instructions (Signed)
Jacob Fowler , ?Thank you for taking time to come for your Medicare Wellness Visit. I appreciate your ongoing commitment to your health goals. Please review the following plan we discussed and let me know if I can assist you in the future.  ? ?Screening recommendations/referrals: ?Colonoscopy: Done 12/31/2017 - Repeat in 5 years  ?Recommended yearly ophthalmology/optometry visit for glaucoma screening and checkup ?Recommended yearly dental visit for hygiene and checkup ? ?Vaccinations: ?Influenza vaccine: Done 06/01/2021 - Repeat annually ?Pneumococcal vaccine: Done 05/27/2013 & 03/06/2018  ?Tdap vaccine: Done 02/16/2014 - Repeat in 10 years ?Shingles vaccine: Declined -was advised not to get due to MS     ?Covid-19: Declined ? ?Advanced directives: Please bring a copy of your health care power of attorney and living will to the office to be added to your chart at your convenience.  ? ?Conditions/risks identified: Aim for 30 minutes of exercise/stretching, 6-8 glasses of water, and 5 servings of fruits and vegetables each day.  ? ?Next appointment: Follow up in one year for your annual wellness visit.  ? ?Preventive Care 72 Years and Older, Male ? ?Preventive care refers to lifestyle choices and visits with your health care provider that can promote health and wellness. ?What does preventive care include? ?A yearly physical exam. This is also called an annual well check. ?Dental exams once or twice a year. ?Routine eye exams. Ask your health care provider how often you should have your eyes checked. ?Personal lifestyle choices, including: ?Daily care of your teeth and gums. ?Regular physical activity. ?Eating a healthy diet. ?Avoiding tobacco and drug use. ?Limiting alcohol use. ?Practicing safe sex. ?Taking low doses of aspirin every day. ?Taking vitamin and mineral supplements as recommended by your health care provider. ?What happens during an annual well check? ?The services and screenings done by your health care  provider during your annual well check will depend on your age, overall health, lifestyle risk factors, and family history of disease. ?Counseling  ?Your health care provider may ask you questions about your: ?Alcohol use. ?Tobacco use. ?Drug use. ?Emotional well-being. ?Home and relationship well-being. ?Sexual activity. ?Eating habits. ?History of falls. ?Memory and ability to understand (cognition). ?Work and work Statistician. ?Screening  ?You may have the following tests or measurements: ?Height, weight, and BMI. ?Blood pressure. ?Lipid and cholesterol levels. These may be checked every 5 years, or more frequently if you are over 72 years old. ?Skin check. ?Lung cancer screening. You may have this screening every year starting at age 72 if you have a 30-pack-year history of smoking and currently smoke or have quit within the past 15 years. ?Fecal occult blood test (FOBT) of the stool. You may have this test every year starting at age 72. ?Flexible sigmoidoscopy or colonoscopy. You may have a sigmoidoscopy every 5 years or a colonoscopy every 10 years starting at age 72. ?Prostate cancer screening. Recommendations will vary depending on your family history and other risks. ?Hepatitis C blood test. ?Hepatitis B blood test. ?Sexually transmitted disease (STD) testing. ?Diabetes screening. This is done by checking your blood sugar (glucose) after you have not eaten for a while (fasting). You may have this done every 1-3 years. ?Abdominal aortic aneurysm (AAA) screening. You may need this if you are a current or former smoker. ?Osteoporosis. You may be screened starting at age 72 if you are at high risk. ?Talk with your health care provider about your test results, treatment options, and if necessary, the need for more tests. ?Vaccines  ?Your  health care provider may recommend certain vaccines, such as: ?Influenza vaccine. This is recommended every year. ?Tetanus, diphtheria, and acellular pertussis (Tdap, Td)  vaccine. You may need a Td booster every 10 years. ?Zoster vaccine. You may need this after age 13. ?Pneumococcal 13-valent conjugate (PCV13) vaccine. One dose is recommended after age 72. ?Pneumococcal polysaccharide (PPSV23) vaccine. One dose is recommended after age 72. ?Talk to your health care provider about which screenings and vaccines you need and how often you need them. ?This information is not intended to replace advice given to you by your health care provider. Make sure you discuss any questions you have with your health care provider. ?Document Released: 07/30/2015 Document Revised: 03/22/2016 Document Reviewed: 05/04/2015 ?Elsevier Interactive Patient Education ? 2017 [place]. ? ?Fall Prevention in the Home ?Falls can cause injuries. They can happen to people of all ages. There are many things you can do to make your home safe and to help prevent falls. ?What can I do on the outside of my home? ?Regularly fix the edges of walkways and driveways and fix any cracks. ?Remove anything that might make you trip as you walk through a door, such as a raised step or threshold. ?Trim any bushes or trees on the path to your home. ?Use bright outdoor lighting. ?Clear any walking paths of anything that might make someone trip, such as rocks or tools. ?Regularly check to see if handrails are loose or broken. Make sure that both sides of any steps have handrails. ?Any raised decks and porches should have guardrails on the edges. ?Have any leaves, snow, or ice cleared regularly. ?Use sand or salt on walking paths during winter. ?Clean up any spills in your garage right away. This includes oil or grease spills. ?What can I do in the bathroom? ?Use night lights. ?Install grab bars by the toilet and in the tub and shower. Do not use towel bars as grab bars. ?Use non-skid mats or decals in the tub or shower. ?If you need to sit down in the shower, use a plastic, non-slip stool. ?Keep the floor dry. Clean up any  water that spills on the floor as soon as it happens. ?Remove soap buildup in the tub or shower regularly. ?Attach bath mats securely with double-sided non-slip rug tape. ?Do not have throw rugs and other things on the floor that can make you trip. ?What can I do in the bedroom? ?Use night lights. ?Make sure that you have a light by your bed that is easy to reach. ?Do not use any sheets or blankets that are too big for your bed. They should not hang down onto the floor. ?Have a firm chair that has side arms. You can use this for support while you get dressed. ?Do not have throw rugs and other things on the floor that can make you trip. ?What can I do in the kitchen? ?Clean up any spills right away. ?Avoid walking on wet floors. ?Keep items that you use a lot in easy-to-reach places. ?If you need to reach something above you, use a strong step stool that has a grab bar. ?Keep electrical cords out of the way. ?Do not use floor polish or wax that makes floors slippery. If you must use wax, use non-skid floor wax. ?Do not have throw rugs and other things on the floor that can make you trip. ?What can I do with my stairs? ?Do not leave any items on the stairs. ?Make sure that there are handrails  on both sides of the stairs and use them. Fix handrails that are broken or loose. Make sure that handrails are as long as the stairways. ?Check any carpeting to make sure that it is firmly attached to the stairs. Fix any carpet that is loose or worn. ?Avoid having throw rugs at the top or bottom of the stairs. If you do have throw rugs, attach them to the floor with carpet tape. ?Make sure that you have a light switch at the top of the stairs and the bottom of the stairs. If you do not have them, ask someone to add them for you. ?What else can I do to help prevent falls? ?Wear shoes that: ?Do not have high heels. ?Have rubber bottoms. ?Are comfortable and fit you well. ?Are closed at the toe. Do not wear sandals. ?If you use a  stepladder: ?Make sure that it is fully opened. Do not climb a closed stepladder. ?Make sure that both sides of the stepladder are locked into place. ?Ask someone to hold it for you, if possible. ?Clearly mark and

## 2021-11-21 NOTE — Progress Notes (Signed)
? ?Subjective:  ? Jacob Fowler is a 71 y.o. male who presents for Medicare Annual/Subsequent preventive examination. ? ?Virtual Visit via Telephone Note ? ?I connected with  Jacob Fowler on 11/21/21 at 11:15 AM EDT by telephone and verified that I am speaking with the correct person using two identifiers. ? ?Location: ?Patient: Home ?Provider: [facility] ?Persons participating in the virtual visit: patient/Nurse Health Advisor ?  ?I discussed the limitations, risks, security and privacy concerns of performing an evaluation and management service by telephone and the availability of in person appointments. The patient expressed understanding and agreed to proceed. ? ?Interactive audio and video telecommunications were attempted between this nurse and patient, however failed, due to patient having technical difficulties OR patient did not have access to video capability.  We continued and completed visit with audio only. ? ?Some vital signs may be absent or patient reported.  ? ?Jacob Couts Dionne Ano, LPN  ? ?Review of Systems    ? ?Cardiac Risk Factors include: advanced age (>44mn, >>73women);diabetes mellitus;hypertension;sedentary lifestyle;male gender;Other (see comment), Risk factor comments: CAD, MS, aortic atherosclerosis ? ?   ?Objective:  ?  ?Today's Vitals  ? 11/21/21 1113  ?Weight: 154 lb (69.9 kg)  ? ?Body mass index is 24.86 kg/m?. ? ? ?  11/21/2021  ? 11:21 AM 12/01/2020  ?  2:00 AM 11/30/2020  ?  9:00 AM 11/25/2020  ?  9:13 AM 11/23/2020  ? 10:32 AM 11/18/2020  ? 12:38 PM 11/18/2019  ?  2:27 PM  ?Advanced Directives  ?Does Patient Have a Medical Advance Directive? _0  No Yes  ?Type of AParamedicof [place]and[place]Living will H[place]hamb[place]Living will [place]il[place]antonLiving will HWest Od[place]Living will HManistee LakeLiving will  Living will  ?Does patient want to make changes to medical advance directive?  No -  Patient declined No - Patient declined No - Guardian declined No - Guardian declined  No - Patient declined  ?Copy of HPinetop Country Clubin Chart? No - copy requested No - copy requested No - copy requested No - copy requested No - copy requested    ?Would patient like information on creating a medical advance directive?  No - Patient declined  No - Patient declined No - Patient declined No - Patient declined   ? ? ?Current Medications (verified) ?Outpatient Encounter Medications as of 11/21/2021  ?Medication Sig  ? alfuzosin (UROXATRAL) 10 MG 24 hr tablet Take 1 tablet (10 mg total) by mouth in the morning and at bedtime.  ? amLODipine (NORVASC) 2.5 MG tablet TAKE ONE TABLET (2.5MG TOTAL) BY MOUTH DAILY  ? aspirin 81 MG EC tablet Take 81 mg by mouth daily.  ? cholecalciferol (VITAMIN D) 1000 UNITS tablet Take 1,000 Units by mouth daily.  ? cyanocobalamin (,VITAMIN B-12,) 1000 MCG/ML injection INJECT 1ML INTO THE SKIN ONCE A MONTH  ? finasteride (PROSCAR) 5 MG tablet Take 1 tablet (5 mg total) by mouth daily.  ? fish oil-omega-3 fatty acids 1000 MG capsule Take 1 g by mouth at bedtime.  ? glucose blood test strip Use as instructed  ? isosorbide mononitrate (IMDUR) 60 MG 24 hr tablet TAKE 1/2 TABLET (30MG TOTAL) BY MOUTH DAILY  ? levothyroxine (SYNTHROID) 75 MCG tablet TAKE ONE TABLET (75MCG TOTAL) BY MOUTH DAILY BEFORE BREAKFAST  ? lisinopril (ZESTRIL) 10 MG tablet TAKE ONE TABLET (10MG TOTAL) BY MOUTH DAILY  ? metFORMIN (GLUCOPHAGE) 1000 MG tablet  TAKE ONE TABLET (1000MG TOTAL) BY MOUTH TWICE DAILY WITH A MEAL  ? omeprazole (PRILOSEC) 20 MG capsule TAKE ONE CAPSULE (20MG TOTAL) BY MOUTH DAILY  ? pioglitazone (ACTOS) 30 MG tablet TAKE ONE TABLET (30MG TOTAL) BY MOUTH DAILY  ? rosuvastatin (CRESTOR) 10 MG tablet TAKE ONE TABLET (10MG TOTAL) BY MOUTH ATBEDTIME  ? silodosin (RAPAFLO) 8 MG CAPS capsule Take 1 capsule (8 mg total) by mouth at bedtime.  ? tamsulosin (FLOMAX) 0.4 MG CAPS capsule TAKE ONE CAPSULE  (0.4MG TOTAL) BY MOUTH DAILY AFTER SUPPER  ? clotrimazole-betamethasone (LOTRISONE) cream APPLY ONE APPLICATION TOPICALLY 2 TIMES DAILY (Patient not taking: Reported on 11/21/2021)  ? nitrofurantoin, macrocrystal-monohydrate, (MACROBID) 100 MG capsule Take 1 capsule (100 mg total) by mouth 2 (two) times daily. (Patient not taking: Reported on 11/21/2021)  ? sulfamethoxazole-trimethoprim (BACTRIM DS) 800-160 MG tablet Take 1 tablet by mouth 2 (two) times daily. (Patient not taking: Reported on 11/21/2021)  ? SYRINGE-NEEDLE, DISP, 3 ML (B-D SYRINGE/NEEDLE 3CC/25GX5/8) 25G X 5/8" 3 ML MISC Use to give once a month B12 injections  ? [DISCONTINUED] atorvastatin (LIPITOR) 10 MG tablet Take 10 mg by mouth daily.  ? [DISCONTINUED] BETASERON 0.3 MG KIT injection Inject 0.25 mg into the skin every other day. (Patient not taking: Reported on 11/21/2021)  ? [DISCONTINUED] niacin-simvastatin (SIMCOR) 500-20 MG 24 hr tablet Take 1 tablet by mouth at bedtime.    ? ?No facility-administered encounter medications on file as of 11/21/2021.  ? ? ?Allergies (verified) ?Patient has no known allergies.  ? ?History: ?Past Medical History:  ?Diagnosis Date  ? Acquired leg length discrepancy   ? Bladder stone   ? BPH associated with nocturia   ? CAD (coronary artery disease) CARDIOLOGIST- DR HOCHREIN--  LAST VISIT 09-27-2011 IN EPIC  ? Non obstructive Cath 2010-  HX CORONARY SPASM 2004  ? Cataract   ? bilateral   ? GERD (gastroesophageal reflux disease)   ? H/O hiatal hernia   ? Heart murmur   ? Heartburn   ? High cholesterol   ? History of nephrolithiasis 2009  ? Hx of adenomatous colonic polyps   ? Hyperlipidemia   ? Hypertension   ? Movement disorder   ? Multiple sclerosis ([place]) DX  10/10---  NEUROLOGIST  DR Delfino Lovett [place] ([place])  ? RIGHT SIDE AFFECTED MORE W/ WEAKNESS- - USES CANE  ? Nephrolithiasis 08/30/2020  ? Neuromuscular disorder ([place])   ? MS  ? NIDDM (non-insulin dependent diabetes mellitus)   ? Peyronie's disease   ? RBBB   ? Renal  stone RIGHT  ? Rosacea   ? Vision abnormalities   ? ?Past Surgical History:  ?Procedure Laterality Date  ? CARDIAC CATHETERIZATION  02-26-2009 ---  DR Ida Rogue  ? NONOBSTRUCTIVE CAD/ 50% DISTAL RCA/ 30% LAD / NORMAL LVF  ? CYSTOSCOPY W/ RETROGRADES  10/30/2011  ? Procedure: CYSTOSCOPY WITH RETROGRADE PYELOGRAM;  Surgeon: Claybon Jabs, MD;  [facility];  Service: Urology;  Laterality: Right;  ? CYSTOSCOPY WITH LITHOLAPAXY  10/30/2011  ? Procedure: CYSTOSCOPY WITH LITHOLAPAXY;  Surgeon: Claybon Jabs, MD;  [facility];  Service: Urology;  Laterality: N/A;  ? CYSTOSCOPY WITH RETROGRADE PYELOGRAM, URETEROSCOPY AND STENT PLACEMENT Left 11/25/2020  ? Procedure: CYSTOSCOPY WITH LEFT RETROGRADE PYELOGRAM, LEFT URETEROSCOPY WITH LASER AND LEFT URETERAL STENT PLACEMENT;  Surgeon: Cleon Gustin, MD;  Location: [facility];  Service: Urology;  Laterality: Left;  ? EXTRACORPOREAL SHOCK WAVE LITHOTRIPSY  09-05-2010  ? RIGHT  ?  EYE SURGERY Right 08/30/2018  ? HOLMIUM LASER APPLICATION Left 03/12/785  ? Procedure: HOLMIUM LASER APPLICATION;  Surgeon: Cleon Gustin, MD;  Location: [facility];  Service: Urology;  Laterality: Left;  ? NESBIT PROCEDURE  10-17-2004  ? CORRECTION OF PENILE ANGULATION (PEYRONIES DISEASE)  ? ?Family History  ?Problem Relation Age of Onset  ? Stroke Mother   ? Heart attack Mother   ? Diabetes Mother   ? Hypertension Mother   ? Renal cancer Father   ? Diabetes Sister   ? Heart attack Brother   ? Diabetes Son   ? Heart attack Maternal Grandfather   ? Heart attack Brother   ? Bladder Cancer Brother   ? ?Social History  ? ?Socioeconomic History  ? Marital status: Married  ?  Spouse name: Hassan Rowan  ? Number of children: 2  ? Years of education: 10  ? Highest education level: Some college, no degree  ?Occupational History  ? Occupation: retired  ?  Comment: self -employed farmer  ?Tobacco Use  ? Smoking status: Former  ?  Packs/day: 1.00  ?  Years: 7.00  ?   Pack years: 7.00  ?  Types: Cigarettes  ?  Quit date: 09/26/1973  ?  Years since quitting: 48.1  ? Smokeless tobacco: Current  ?  Types: Chew  ? Tobacco comments:  ?  Chewing tobacco  ?Vaping Use  ? Vapin

## 2021-11-29 ENCOUNTER — Ambulatory Visit: Payer: PPO | Admitting: Family Medicine

## 2021-11-30 ENCOUNTER — Ambulatory Visit (INDEPENDENT_AMBULATORY_CARE_PROVIDER_SITE_OTHER): Payer: PPO | Admitting: Family Medicine

## 2021-11-30 ENCOUNTER — Encounter: Payer: Self-pay | Admitting: Family Medicine

## 2021-11-30 VITALS — BP 93/65 | HR 94 | Temp 97.8°F

## 2021-11-30 DIAGNOSIS — E1169 Type 2 diabetes mellitus with other specified complication: Secondary | ICD-10-CM | POA: Diagnosis not present

## 2021-11-30 DIAGNOSIS — K219 Gastro-esophageal reflux disease without esophagitis: Secondary | ICD-10-CM | POA: Diagnosis not present

## 2021-11-30 DIAGNOSIS — E1159 Type 2 diabetes mellitus with other circulatory complications: Secondary | ICD-10-CM

## 2021-11-30 DIAGNOSIS — E039 Hypothyroidism, unspecified: Secondary | ICD-10-CM

## 2021-11-30 DIAGNOSIS — E538 Deficiency of other specified B group vitamins: Secondary | ICD-10-CM

## 2021-11-30 DIAGNOSIS — E559 Vitamin D deficiency, unspecified: Secondary | ICD-10-CM | POA: Diagnosis not present

## 2021-11-30 DIAGNOSIS — I700 Atherosclerosis of aorta: Secondary | ICD-10-CM | POA: Diagnosis not present

## 2021-11-30 DIAGNOSIS — G35D Multiple sclerosis, unspecified: Secondary | ICD-10-CM

## 2021-11-30 DIAGNOSIS — E785 Hyperlipidemia, unspecified: Secondary | ICD-10-CM

## 2021-11-30 DIAGNOSIS — I152 Hypertension secondary to endocrine disorders: Secondary | ICD-10-CM | POA: Diagnosis not present

## 2021-11-30 DIAGNOSIS — E119 Type 2 diabetes mellitus without complications: Secondary | ICD-10-CM | POA: Diagnosis not present

## 2021-11-30 DIAGNOSIS — G35 Multiple sclerosis: Secondary | ICD-10-CM

## 2021-11-30 DIAGNOSIS — I25118 Atherosclerotic heart disease of native coronary artery with other forms of angina pectoris: Secondary | ICD-10-CM | POA: Diagnosis not present

## 2021-11-30 LAB — BAYER DCA HB A1C WAIVED: HB A1C (BAYER DCA - WAIVED): 9 % — ABNORMAL HIGH (ref 4.8–5.6)

## 2021-11-30 MED ORDER — OZEMPIC (0.25 OR 0.5 MG/DOSE) 2 MG/1.5ML ~~LOC~~ SOPN: 0.2500 mg | PEN_INJECTOR | SUBCUTANEOUS | 0 refills | Status: DC

## 2021-11-30 NOTE — Progress Notes (Signed)
Assessment & Plan:  1. Diabetes mellitus type 2 in nonobese [facility]) Lab Results  Component Value Date   HGBA1C 9.0 (H) 11/30/2021   HGBA1C 6.8 (H) 06/01/2021   HGBA1C 6.9 (H) 11/23/2020    - Diabetes is not at goal of A1c < 7. - Medications: continue current medications, Start Ozempic weekly - Home glucose monitoring: Continue monitoring - Patient is currently taking a statin. Patient is taking an ACE-inhibitor/ARB.  - Instruction/counseling given: reminded to get eye exam and discussed foot care  Diabetes Health Maintenance Due  Topic Date Due   OPHTHALMOLOGY EXAM  08/31/2018   HEMOGLOBIN A1C  06/02/2022   FOOT EXAM  12/01/2022    Lab Results  Component Value Date   LABMICR See below: 10/03/2021   LABMICR Comment 09/15/2021   MICROALBUR negative 02/19/2015   MICROALBUR neg 01/28/2014   - Lipid panel - CBC with Differential/Platelet - CMP14+EGFR - Bayer DCA Hb A1c Waived - Vitamin B12 - Semaglutide,0.25 or 0.5MG/DOS, (OZEMPIC, 0.25 OR 0.5 MG/DOSE,) 2 MG/1.5ML SOPN; Inject 0.25 mg into the skin once a week.  Dispense: 1.5 mL; Refill: 0 - Specimen status report  2. Hypertension associated with type 2 diabetes mellitus ([place]) Soft today, but patient reports good readings at home. - Lipid panel - CBC with Differential/Platelet - CMP14+EGFR  3. Hyperlipidemia associated with type 2 diabetes mellitus ([place]) Well controlled on current regimen.  - Lipid panel - CBC with Differential/Platelet - CMP14+EGFR  4. Acquired hypothyroidism Well controlled on current regimen.  - TSH - T4, Free  5. Vitamin D deficiency Labs to assess. - VITAMIN D 25 Hydroxy (Vit-D Deficiency, Fractures)  6. Aortic atherosclerosis (HCC) Continue statin and aspirin. - Lipid panel  7. Atherosclerosis of native coronary artery of native heart with stable angina pectoris (HCC) Continue statin and aspirin. - Lipid panel  8. Gastroesophageal reflux disease, unspecified whether esophagitis  present Well controlled on current regimen.  - CMP14+EGFR  9. Multiple sclerosis ([place]) Managed by neurology.  10. B12 deficiency Labs to assess. - Vitamin B12   Return in about 3 months (around 03/02/2022) for DM.  Hendricks Limes, MSN, APRN, FNP-C [facility]  Subjective:    [name], male    DOB: July 20, 1950, 71 y.o.   MRN: [id number]  Patient Care Team: [name], FNP as PCP - General (Family Medicine) Sater, Nanine Means, MD (Neurology) Minus Breeding, MD as Consulting Physician (Cardiology) Ilean China, RN as Registered Nurse Celestia Khat, [place] (Optometry) McKenzie, Candee Furbish, MD as Consulting Physician (Urology)   Chief Complaint:  Chief Complaint  Patient presents with   Medical Management of Chronic Issues    HPI: Jacob Fowler is a 71 y.o. male presenting on 11/30/2021 for Medical Management of Chronic Issues  Patient is accompanied by his wife who he is okay with being present.   Diabetes: Patient presents for follow up of diabetes. Current symptoms include: none. Known diabetic complications: none. Medication compliance: yes. Current diet: in general, a "healthy" diet  . Current exercise:  unable due to MS . Home blood sugar records: BGs are running consistent with Hgb A1C. Is he  on ACE inhibitor or angiotensin II receptor blocker? Yes (Lisinopril). Is he on a statin? Yes (Rosuvastatin).   Hypertension: Patient's blood pressure is on the low side this morning.  He has already taken his medications this morning.  He does check his blood pressure at home and reports it is normally 563S systolic.  Hyperlipidemia/Aortic atherosclerosis/Coronary atherosclerosis: taking Rosuvastatin and aspirin.    Hypothyroidism: taking levothyroxine. Last labs on 03/28/2021 WNL.    GERD: taking omeprazole.   Vitamin D deficiency: taking an OTC vitamin D3 supplement.   Vitamin B12 deficiency: getting monthly  injections.    Patient is following with neurology for MS and urology for his elevated PSA.  New complaints: None   Social history:  Relevant past medical, surgical, family and social history reviewed and updated as indicated. Interim medical history since our last visit reviewed.  Allergies and medications reviewed and updated.  DATA REVIEWED: CHART IN EPIC  ROS: Negative unless specifically indicated above in HPI.    Current Outpatient Medications:    alfuzosin (UROXATRAL) 10 MG 24 hr tablet, Take 1 tablet (10 mg total) by mouth in the morning and at bedtime., Disp: 180 tablet, Rfl: 3   amLODipine (NORVASC) 2.5 MG tablet, TAKE ONE TABLET (2.5MG TOTAL) BY MOUTH DAILY, Disp: 90 tablet, Rfl: 1   aspirin 81 MG EC tablet, Take 81 mg by mouth daily., Disp: , Rfl:    cholecalciferol (VITAMIN D) 1000 UNITS tablet, Take 1,000 Units by mouth daily., Disp: , Rfl:    clotrimazole-betamethasone (LOTRISONE) cream, APPLY ONE APPLICATION TOPICALLY 2 TIMES DAILY, Disp: 30 g, Rfl: 2   cyanocobalamin (,VITAMIN B-12,) 1000 MCG/ML injection, INJECT 1ML INTO THE SKIN ONCE A MONTH, Disp: 3 mL, Rfl: 0   finasteride (PROSCAR) 5 MG tablet, Take 1 tablet (5 mg total) by mouth daily., Disp: 90 tablet, Rfl: 3   fish oil-omega-3 fatty acids 1000 MG capsule, Take 1 g by mouth at bedtime., Disp: , Rfl:    glucose blood test strip, Use as instructed, Disp: 100 each, Rfl: 12   isosorbide mononitrate (IMDUR) 60 MG 24 hr tablet, TAKE 1/2 TABLET (30MG TOTAL) BY MOUTH DAILY, Disp: 45 tablet, Rfl: 1   levothyroxine (SYNTHROID) 75 MCG tablet, TAKE ONE TABLET (75MCG TOTAL) BY MOUTH DAILY BEFORE BREAKFAST, Disp: 90 tablet, Rfl: 3   lisinopril (ZESTRIL) 10 MG tablet, TAKE ONE TABLET (10MG TOTAL) BY MOUTH DAILY, Disp: 90 tablet, Rfl: 1   metFORMIN (GLUCOPHAGE) 1000 MG tablet, TAKE ONE TABLET (1000MG TOTAL) BY MOUTH TWICE DAILY WITH A MEAL, Disp: 180 tablet, Rfl: 1   omeprazole (PRILOSEC) 20 MG capsule, TAKE ONE CAPSULE (20MG  TOTAL) BY MOUTH DAILY, Disp: 90 capsule, Rfl: 1   pioglitazone (ACTOS) 30 MG tablet, TAKE ONE TABLET (30MG TOTAL) BY MOUTH DAILY, Disp: 90 tablet, Rfl: 1   rosuvastatin (CRESTOR) 10 MG tablet, TAKE ONE TABLET (10MG TOTAL) BY MOUTH ATBEDTIME, Disp: 90 tablet, Rfl: 0   Semaglutide,0.25 or 0.5MG/DOS, (OZEMPIC, 0.25 OR 0.5 MG/DOSE,) 2 MG/1.5ML SOPN, Inject 0.25 mg into the skin once a week., Disp: 1.5 mL, Rfl: 0   silodosin (RAPAFLO) 8 MG CAPS capsule, Take 1 capsule (8 mg total) by mouth at bedtime., Disp: 30 capsule, Rfl: 11   SYRINGE-NEEDLE, DISP, 3 ML (B-D SYRINGE/NEEDLE 3CC/25GX5/8) 25G X 5/8" 3 ML MISC, Use to give once a month B12 injections, Disp: 12 each, Rfl: 0   tamsulosin (FLOMAX) 0.4 MG CAPS capsule, TAKE ONE CAPSULE (0.4MG TOTAL) BY MOUTH DAILY AFTER SUPPER, Disp: 90 capsule, Rfl: 1   No Known Allergies Past Medical History:  Diagnosis Date   Acquired leg length discrepancy    Bladder stone    BPH associated with nocturia    CAD (coronary artery disease) CARDIOLOGIST- DR HOCHREIN--  LAST VISIT 09-27-2011 IN EPIC   Non obstructive Cath 2010-  HX CORONARY  SPASM 2004   Cataract    bilateral    GERD (gastroesophageal reflux disease)    H/O hiatal hernia    Heart murmur    Heartburn    High cholesterol    History of nephrolithiasis 2009   Hx of adenomatous colonic polyps    Hyperlipidemia    Hypertension    Movement disorder    Multiple sclerosis ([place]) DX  10/10---  NEUROLOGIST  DR Delfino Lovett [facility] ([place])   RIGHT SIDE AFFECTED MORE W/ WEAKNESS- - USES CANE   Nephrolithiasis 08/30/2020   Neuromuscular disorder (HCC)    MS   NIDDM (non-insulin dependent diabetes mellitus)    Peyronie's disease    RBBB    Renal stone RIGHT   Rosacea    Vision abnormalities     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 / NORMAL LVF   CYSTOSCOPY W/ RETROGRADES  10/30/2011    Procedure: CYSTOSCOPY WITH RETROGRADE PYELOGRAM;  Surgeon: Claybon Jabs, MD;  Location: [facility];  Service: Urology;  Laterality: Right;   CYSTOSCOPY WITH LITHOLAPAXY  10/30/2011   Procedure: CYSTOSCOPY WITH LITHOLAPAXY;  Surgeon: Claybon Jabs, MD;  Location[facility];  Service: Urology;  Laterality: N/A;   CYSTOSCOPY WITH RETROGRADE PYELOGRAM, URETEROSCOPY AND STENT PLACEMENT Left 11/25/2020   Procedure: CYSTOSCOPY WITH LEFT RETROGRADE PYELOGRAM, LEFT URETEROSCOPY WITH LASER AND LEFT URETERAL STENT PLACEMENT;  Surgeon: Cleon Gustin, MD;  Location: [facility];  Service: Urology;  Laterality: Left;   EXTRACORPOREAL SHOCK WAVE LITHOTRIPSY  09-05-2010   RIGHT   EYE SURGERY Right 08/30/2018   HOLMIUM LASER APPLICATION Left 6/96/7893   Procedure: HOLMIUM LASER APPLICATION;  Surgeon: Cleon Gustin, MD;  Location: [facility];  Service: Urology;  Laterality: Left;   NESBIT PROCEDURE  10-17-2004   CORRECTION OF PENILE ANGULATION (PEYRONIES DISEASE)    Social History   Socioeconomic History   Marital status: Married    Spouse name: Hassan Rowan   Number of children: 2   Years of education: 13   Highest education level: Some college, no degree  Occupational History   Occupation: retired    Comment: self -employed farmer  Tobacco Use   Smoking status: Former    Packs/day: 1.00    Years: 7.00    Pack years: 7.00    Types: Cigarettes    Quit date: 09/26/1973    Years since quitting: 48.2   Smokeless tobacco: Current    Types: Chew   Tobacco comments:    Chewing tobacco  Vaping Use   Vaping Use: Never used  Substance and Sexual Activity   Alcohol use: No    Alcohol/week: 0.0 standard drinks   Drug use: No   Sexual activity: Not on file  Other Topics Concern   Not on file  Social History Narrative   Lives with wife, has MS, mobility issues.   Social Determinants of Health   Financial Resource Strain: Not on file  Food Insecurity: Not on file   Transportation Needs: Not on file  Physical Activity: Not on file  Stress: Not on file  Social Connections: Not on file  Intimate Partner Violence: Not on file        Objective:    BP 93/65   Pulse 94   Temp 97.8 F (36.6 C) (Temporal)   SpO2 95%   Wt Readings from Last 3  Encounters:  11/21/21 154 lb (69.9 kg)  09/14/21 154 lb (69.9 kg)  02/14/21 154 lb (69.9 kg)    Physical Exam Vitals reviewed.  Constitutional:      General: He is not in acute distress.    Appearance: Normal appearance. He is normal weight. He is not ill-appearing, toxic-appearing or diaphoretic.  HENT:     Head: Normocephalic and atraumatic.  Eyes:     General: No scleral icterus.       Right eye: No discharge.        Left eye: No discharge.     Conjunctiva/sclera: Conjunctivae normal.  Cardiovascular:     Rate and Rhythm: Normal rate and regular rhythm.     Heart sounds: Normal heart sounds. No murmur heard.   No friction rub. No gallop.  Pulmonary:     Effort: Pulmonary effort is normal. No respiratory distress.     Breath sounds: Normal breath sounds. No stridor. No wheezing, rhonchi or rales.  Musculoskeletal:        General: Normal range of motion.     Cervical back: Normal range of motion.     Right foot: Foot drop present.  Skin:    General: Skin is warm and dry.  Neurological:     Mental Status: He is alert and oriented to person, place, and time. Mental status is at baseline.     Gait: Gait abnormal (ambulates with rolling walker).  Psychiatric:        Mood and Affect: Mood normal.        Behavior: Behavior normal.        Thought Content: Thought content normal.        Judgment: Judgment normal.   Diabetic Foot Exam - Simple   Simple Foot Form Diabetic Foot exam was performed with the following findings: Yes 11/30/2021  4:07 PM  Visual Inspection No deformities, no ulcerations, no other skin breakdown bilaterally: Yes Sensation Testing Intact to touch and monofilament testing  bilaterally: Yes Pulse Check Posterior Tibialis and Dorsalis pulse intact bilaterally: Yes Comments      Lab Results  Component Value Date   TSH 4.240 11/30/2021   Lab Results  Component Value Date   WBC 9.4 11/30/2021   HGB 10.8 (L) 11/30/2021   HCT 33.7 (L) 11/30/2021   MCV 85 11/30/2021   PLT 354 11/30/2021   Lab Results  Component Value Date   NA 136 11/30/2021   K 6.2 (HH) 11/30/2021   CO2 16 (L) 11/30/2021   GLUCOSE 202 (H) 11/30/2021   BUN 58 (H) 11/30/2021   CREATININE 2.69 (H) 11/30/2021   BILITOT <0.2 11/30/2021   ALKPHOS 119 11/30/2021   AST 14 11/30/2021   ALT 21 11/30/2021   PROT 7.6 11/30/2021   ALBUMIN 4.4 11/30/2021   CALCIUM 9.5 11/30/2021   ANIONGAP 8 12/03/2020   EGFR 25 (L) 11/30/2021   Lab Results  Component Value Date   CHOL 108 11/30/2021   Lab Results  Component Value Date   HDL 25 (L) 11/30/2021   Lab Results  Component Value Date   LDLCALC 38 11/30/2021   Lab Results  Component Value Date   TRIG 297 (H) 11/30/2021   Lab Results  Component Value Date   CHOLHDL 4.3 11/30/2021   Lab Results  Component Value Date   HGBA1C 9.0 (H) 11/30/2021

## 2021-11-30 NOTE — Patient Instructions (Signed)
Schedule your eye exam. ?

## 2021-12-01 ENCOUNTER — Other Ambulatory Visit: Payer: Self-pay | Admitting: Family Medicine

## 2021-12-01 DIAGNOSIS — N179 Acute kidney failure, unspecified: Secondary | ICD-10-CM

## 2021-12-01 LAB — CBC WITH DIFFERENTIAL/PLATELET
Basophils Absolute: 0 10*3/uL (ref 0.0–0.2)
Basos: 0 %
EOS (ABSOLUTE): 0.2 10*3/uL (ref 0.0–0.4)
Eos: 2 %
Hematocrit: 33.7 % — ABNORMAL LOW (ref 37.5–51.0)
Hemoglobin: 10.8 g/dL — ABNORMAL LOW (ref 13.0–17.7)
Immature Grans (Abs): 0.1 10*3/uL (ref 0.0–0.1)
Immature Granulocytes: 1 %
Lymphocytes Absolute: 1.4 10*3/uL (ref 0.7–3.1)
Lymphs: 15 %
MCH: 27.3 pg (ref 26.6–33.0)
MCHC: 32 g/dL (ref 31.5–35.7)
MCV: 85 fL (ref 79–97)
Monocytes Absolute: 0.9 10*3/uL (ref 0.1–0.9)
Monocytes: 10 %
Neutrophils Absolute: 6.9 10*3/uL (ref 1.4–7.0)
Neutrophils: 72 %
Platelets: 354 10*3/uL (ref 150–450)
RBC: 3.95 x10E6/uL — ABNORMAL LOW (ref 4.14–5.80)
RDW: 17.1 % — ABNORMAL HIGH (ref 11.6–15.4)
WBC: 9.4 10*3/uL (ref 3.4–10.8)

## 2021-12-01 LAB — T4, FREE: Free T4: 1.16 ng/dL (ref 0.82–1.77)

## 2021-12-01 LAB — TSH: TSH: 4.24 u[IU]/mL (ref 0.450–4.500)

## 2021-12-01 LAB — LIPID PANEL
Chol/HDL Ratio: 4.3 ratio (ref 0.0–5.0)
Cholesterol, Total: 108 mg/dL (ref 100–199)
HDL: 25 mg/dL — ABNORMAL LOW (ref >39)
LDL Chol Calc (NIH): 38 mg/dL (ref 0–99)
Triglycerides: 297 mg/dL — ABNORMAL HIGH (ref 0–149)
VLDL Cholesterol Cal: 45 mg/dL — ABNORMAL HIGH (ref 5–40)

## 2021-12-01 LAB — SPECIMEN STATUS REPORT

## 2021-12-01 LAB — CMP14+EGFR
ALT: 21 IU/L (ref 0–44)
AST: 14 IU/L (ref 0–40)
Albumin/Globulin Ratio: 1.4 (ref 1.2–2.2)
Albumin: 4.4 g/dL (ref 3.7–4.7)
Alkaline Phosphatase: 119 IU/L (ref 44–121)
BUN/Creatinine Ratio: 22 (ref 10–24)
BUN: 58 mg/dL — ABNORMAL HIGH (ref 8–27)
Bilirubin Total: <0.2 mg/dL (ref 0.0–1.2)
CO2: 16 mmol/L — ABNORMAL LOW (ref 20–29)
Calcium: 9.5 mg/dL (ref 8.6–10.2)
Chloride: 102 mmol/L (ref 96–106)
Creatinine, Ser: 2.69 mg/dL — ABNORMAL HIGH (ref 0.76–1.27)
Globulin, Total: 3.2 g/dL (ref 1.5–4.5)
Glucose: 202 mg/dL — ABNORMAL HIGH (ref 70–99)
Potassium: 6.2 mmol/L (ref 3.5–5.2)
Sodium: 136 mmol/L (ref 134–144)
Total Protein: 7.6 g/dL (ref 6.0–8.5)
eGFR: 25 mL/min/{1.73_m2} — ABNORMAL LOW (ref >59)

## 2021-12-01 LAB — VITAMIN B12: Vitamin B-12: 583 pg/mL (ref 232–1245)

## 2021-12-04 ENCOUNTER — Encounter: Payer: Self-pay | Admitting: Family Medicine

## 2021-12-05 ENCOUNTER — Other Ambulatory Visit: Payer: PPO

## 2021-12-05 DIAGNOSIS — E119 Type 2 diabetes mellitus without complications: Secondary | ICD-10-CM

## 2021-12-05 DIAGNOSIS — N179 Acute kidney failure, unspecified: Secondary | ICD-10-CM

## 2021-12-05 DIAGNOSIS — E559 Vitamin D deficiency, unspecified: Secondary | ICD-10-CM | POA: Diagnosis not present

## 2021-12-05 NOTE — Progress Notes (Signed)
Lab added and paper taken to the lab

## 2021-12-06 LAB — BMP8+EGFR
BUN/Creatinine Ratio: 22 (ref 10–24)
BUN: 58 mg/dL — ABNORMAL HIGH (ref 8–27)
CO2: 17 mmol/L — ABNORMAL LOW (ref 20–29)
Calcium: 9.4 mg/dL (ref 8.6–10.2)
Chloride: 103 mmol/L (ref 96–106)
Creatinine, Ser: 2.65 mg/dL — ABNORMAL HIGH (ref 0.76–1.27)
Glucose: 201 mg/dL — ABNORMAL HIGH (ref 70–99)
Potassium: 5.2 mmol/L (ref 3.5–5.2)
Sodium: 138 mmol/L (ref 134–144)
eGFR: 25 mL/min/{1.73_m2} — ABNORMAL LOW (ref >59)

## 2021-12-06 LAB — MICROALBUMIN / CREATININE URINE RATIO
Creatinine, Urine: 81.7 mg/dL
Microalb/Creat Ratio: 561 mg/g creat — ABNORMAL HIGH (ref 0–29)
Microalbumin, Urine: 458 ug/mL

## 2021-12-07 LAB — VITAMIN D 25 HYDROXY (VIT D DEFICIENCY, FRACTURES): Vit D, 25-Hydroxy: 29.3 ng/mL — ABNORMAL LOW (ref 30.0–100.0)

## 2021-12-07 LAB — SPECIMEN STATUS REPORT

## 2021-12-08 ENCOUNTER — Other Ambulatory Visit (HOSPITAL_COMMUNITY): Payer: Self-pay | Admitting: Nephrology

## 2021-12-08 ENCOUNTER — Other Ambulatory Visit: Payer: Self-pay | Admitting: Nephrology

## 2021-12-08 DIAGNOSIS — N189 Chronic kidney disease, unspecified: Secondary | ICD-10-CM | POA: Diagnosis not present

## 2021-12-08 DIAGNOSIS — G35 Multiple sclerosis: Secondary | ICD-10-CM | POA: Diagnosis not present

## 2021-12-08 DIAGNOSIS — E8722 Chronic metabolic acidosis: Secondary | ICD-10-CM | POA: Diagnosis not present

## 2021-12-08 DIAGNOSIS — N170 Acute kidney failure with tubular necrosis: Secondary | ICD-10-CM | POA: Diagnosis not present

## 2021-12-08 DIAGNOSIS — D638 Anemia in other chronic diseases classified elsewhere: Secondary | ICD-10-CM | POA: Diagnosis not present

## 2021-12-08 DIAGNOSIS — R809 Proteinuria, unspecified: Secondary | ICD-10-CM

## 2021-12-08 DIAGNOSIS — Z8679 Personal history of other diseases of the circulatory system: Secondary | ICD-10-CM | POA: Diagnosis not present

## 2021-12-08 DIAGNOSIS — N200 Calculus of kidney: Secondary | ICD-10-CM | POA: Diagnosis not present

## 2021-12-08 DIAGNOSIS — E1122 Type 2 diabetes mellitus with diabetic chronic kidney disease: Secondary | ICD-10-CM | POA: Diagnosis not present

## 2021-12-08 DIAGNOSIS — E559 Vitamin D deficiency, unspecified: Secondary | ICD-10-CM | POA: Diagnosis not present

## 2021-12-08 DIAGNOSIS — E1129 Type 2 diabetes mellitus with other diabetic kidney complication: Secondary | ICD-10-CM | POA: Diagnosis not present

## 2021-12-08 DIAGNOSIS — I959 Hypotension, unspecified: Secondary | ICD-10-CM | POA: Diagnosis not present

## 2021-12-13 DIAGNOSIS — E8722 Chronic metabolic acidosis: Secondary | ICD-10-CM | POA: Diagnosis not present

## 2021-12-13 DIAGNOSIS — N189 Chronic kidney disease, unspecified: Secondary | ICD-10-CM | POA: Diagnosis not present

## 2021-12-13 DIAGNOSIS — N170 Acute kidney failure with tubular necrosis: Secondary | ICD-10-CM | POA: Diagnosis not present

## 2021-12-13 DIAGNOSIS — N200 Calculus of kidney: Secondary | ICD-10-CM | POA: Diagnosis not present

## 2021-12-13 DIAGNOSIS — E1129 Type 2 diabetes mellitus with other diabetic kidney complication: Secondary | ICD-10-CM | POA: Diagnosis not present

## 2021-12-13 DIAGNOSIS — E1122 Type 2 diabetes mellitus with diabetic chronic kidney disease: Secondary | ICD-10-CM | POA: Diagnosis not present

## 2021-12-13 DIAGNOSIS — R809 Proteinuria, unspecified: Secondary | ICD-10-CM | POA: Diagnosis not present

## 2021-12-13 IMAGING — CT CT RENAL STONE PROTOCOL
2 of 4 series · 15 of 46 positions shown, 17 images · non-contrast
Comparison: CT the abdomen and pelvis 11/30/2020.

CLINICAL DATA: 69-year-old male with history of abdominal pain with
urinary extravasation on prior study. Follow-up examination.

EXAM:
CT ABDOMEN AND PELVIS WITHOUT CONTRAST
TECHNIQUE: Multidetector CT imaging of the abdomen and pelvis was performed
following the standard protocol without IV contrast.

[Series 2: axial st · axial · 0.75mm/px · z∈[+1023,+1438]mm · 12 of 99 slices shown, 14 images]
[im 8/99  soft-tissue]
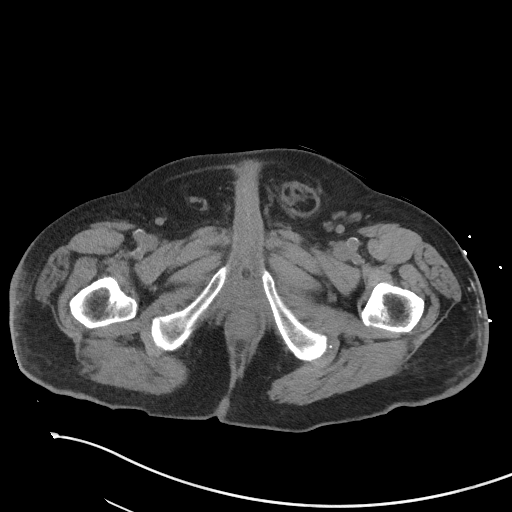
[im 8/99  bone]
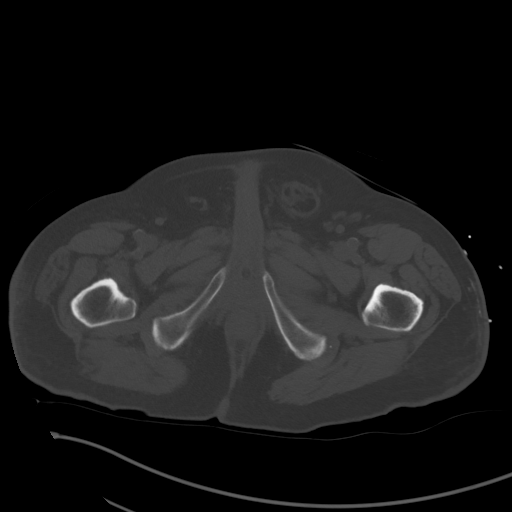
[im 16/99  soft-tissue]
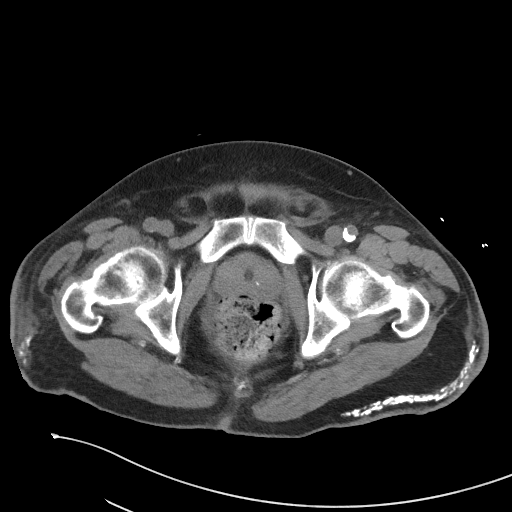
[im 23/99  soft-tissue]
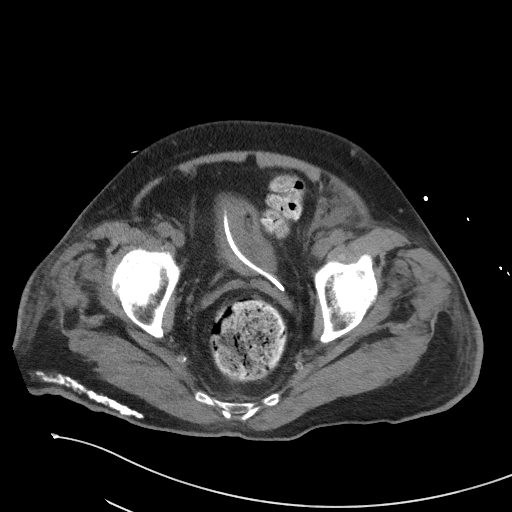
[im 31/99  soft-tissue]
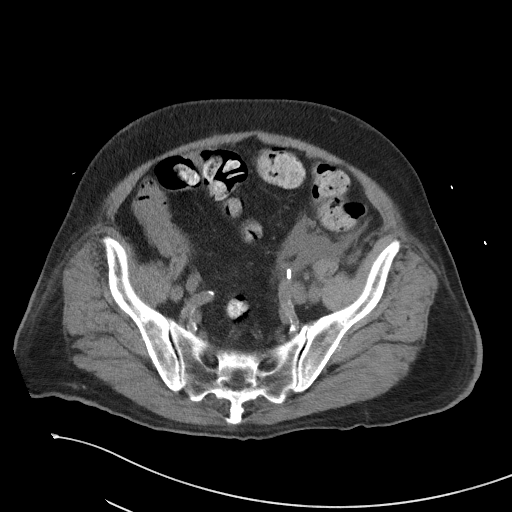
[im 38/99  soft-tissue]
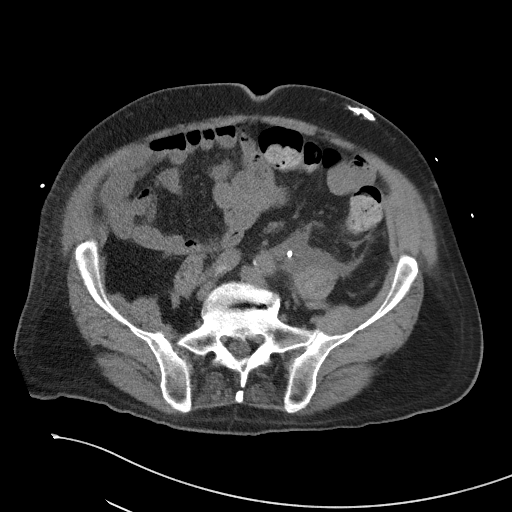
[im 46/99  soft-tissue]
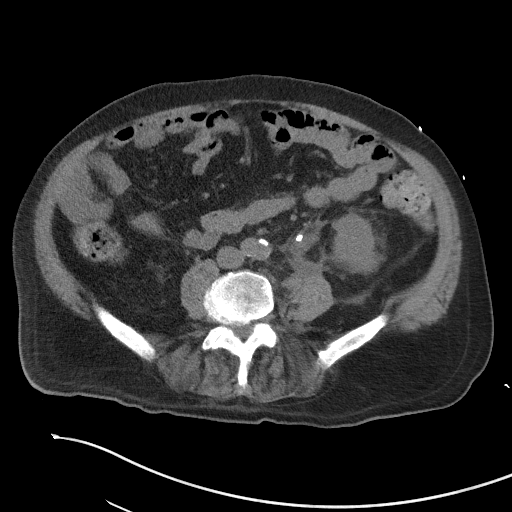
[im 53/99  soft-tissue]
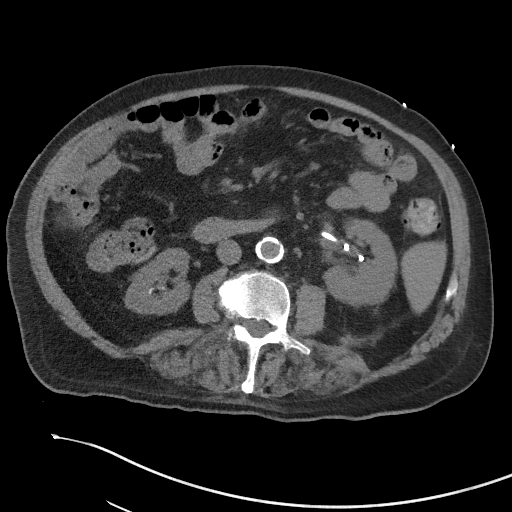
[im 61/99  soft-tissue]
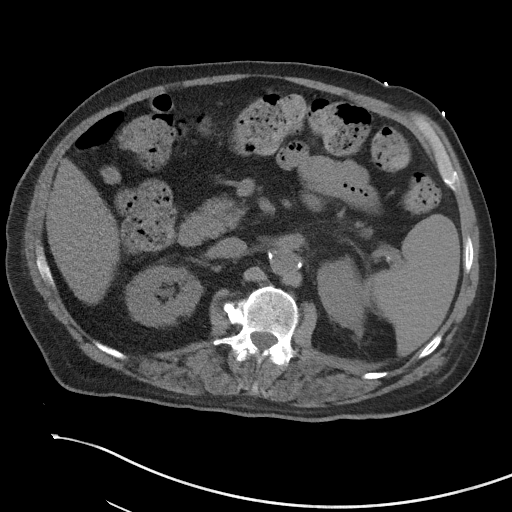
[im 68/99  soft-tissue]
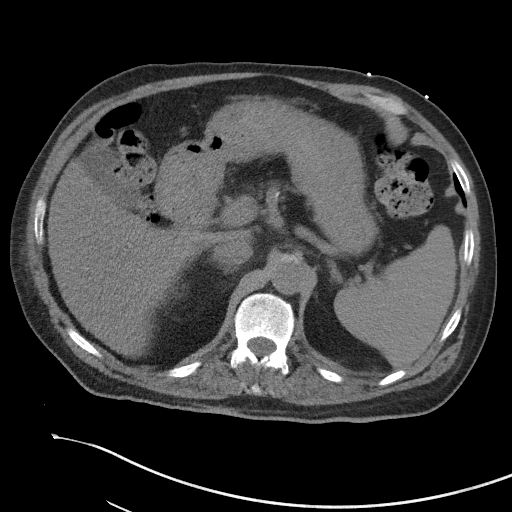
[im 68/99  bone]
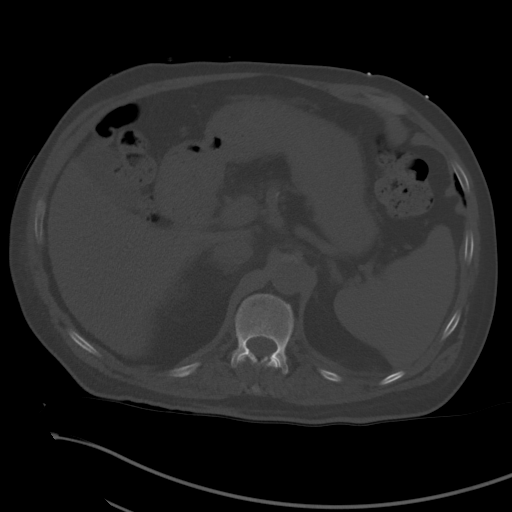
[im 76/99  soft-tissue]
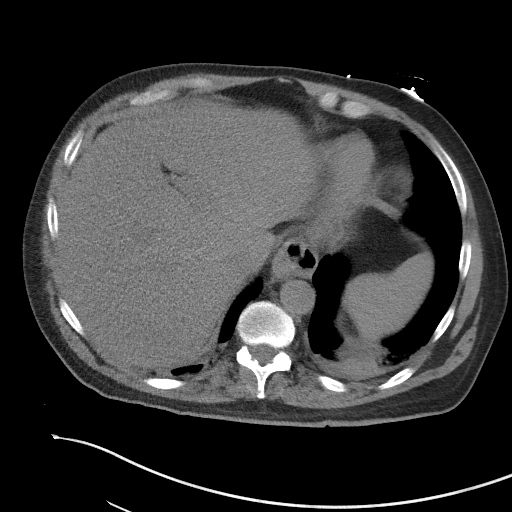
[im 83/99  soft-tissue]
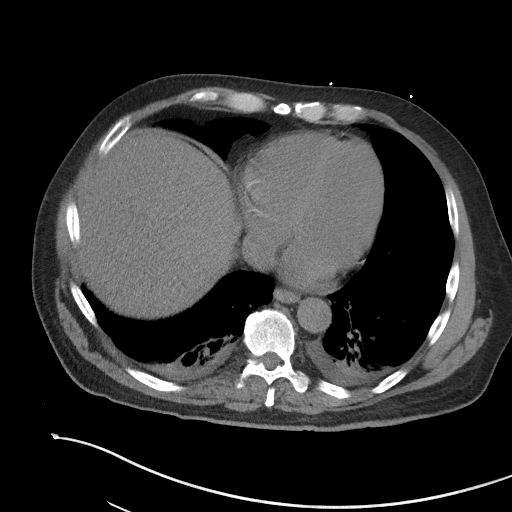
[im 91/99  soft-tissue]
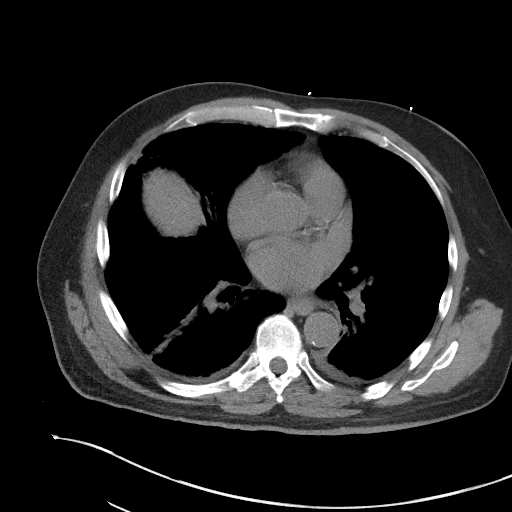

[Series 5: coronal st · coronal · 0.83mm/px · 3 of 102 slices shown]
[im 34/102  soft-tissue]
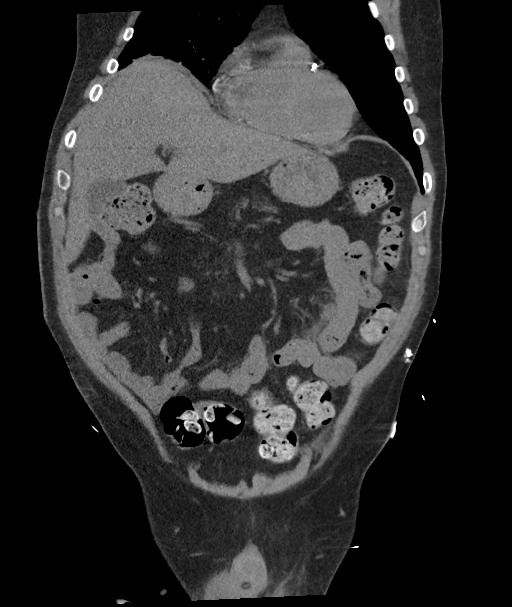
[im 45/102  soft-tissue]
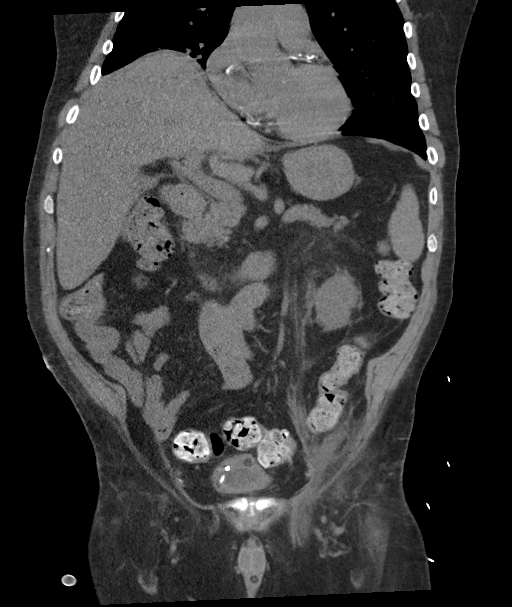
[im 57/102  soft-tissue]
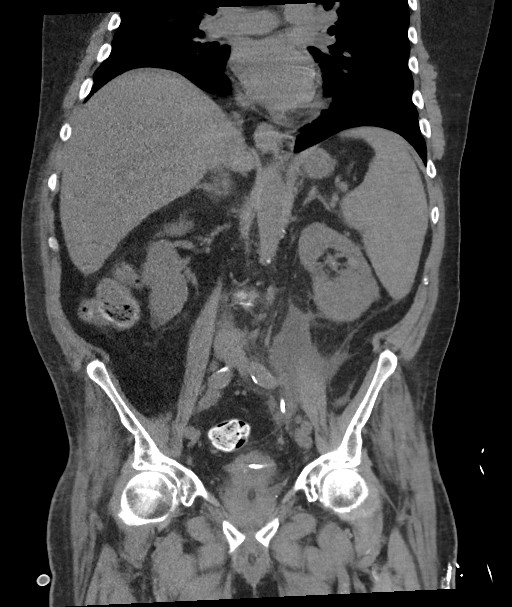

[15 of 46 positions shown; findings below may reference images not displayed]

FINDINGS: Lower chest: Linear areas of scarring are noted in the lung bases
bilaterally. Elevation of the right hemidiaphragm. Atherosclerotic
calcifications in the descending thoracic aorta as well as the left
main, left anterior descending, left circumflex and right coronary
arteries. Faint calcifications of the aortic valve.

Hepatobiliary: Diffuse low attenuation throughout the visualized
hepatic parenchyma, indicative of hepatic steatosis. No discrete
cystic or solid hepatic lesions are confidently identified on
today's noncontrast CT examination. Calcified granuloma in segment
4B of the liver incidentally noted. Unenhanced appearance of the
gallbladder is normal.

Pancreas: No definite pancreatic mass or peripancreatic fluid
collections or inflammatory changes are noted on today's noncontrast
CT examination.

Spleen: Unremarkable.

Adrenals/Urinary Tract: Again noted is a left-sided double-J
ureteral stent, with the proximal loop reformed in the left renal
pelvis and distal loop reformed in the urinary bladder. There
continues to be some abnormal fluid around the left ureter and in
the retroperitoneum, although this has significantly decreased
compared to the prior study. The largest portion of this collection
currently measures approximately 4.9 x 2.3 cm (axial image 67 of
series 2) as compared with 6.3 x 4.3 cm on prior study 11/30/2020.
Multiple nonobstructive calculi are noted within the collecting
systems of both kidneys measuring up to 6 mm in the lower pole
collecting system of the right kidney and 6 mm in the interpolar
collecting system of the left kidney. Urinary bladder is nearly
completely decompressed around an indwelling Foley balloon catheter.
Small amount of gas non dependently in the urinary bladder is
presumably iatrogenic. Bilateral adrenal glands are normal in
appearance.

Stomach/Bowel: Unenhanced appearance of the stomach is normal. No
pathologic dilatation of small bowel or colon. Normal appendix.

Vascular/Lymphatic: Aortic atherosclerosis. No lymphadenopathy noted
in the abdomen or pelvis.

Reproductive: Prostate gland and seminal vesicles are unremarkable
in appearance.

Other: No significant volume of ascites.  No pneumoperitoneum.

Musculoskeletal: There are no aggressive appearing lytic or blastic
lesions noted in the visualized portions of the skeleton.
IMPRESSION: 1. Left-sided double-J ureteral stent appears appropriately located.
The extent of left perinephric and periureteric fluid has
significantly decreased compared to the prior examination, as above.
2. Hepatic steatosis.
3. Aortic atherosclerosis, in addition to left main and 3 vessel
coronary artery disease. Assessment for potential risk factor
modification, dietary therapy or pharmacologic therapy may be
warranted, if clinically indicated.
4. There are calcifications of the aortic valve. Echocardiographic
correlation for evaluation of potential valvular dysfunction may be
warranted if clinically indicated.
5. Additional incidental findings, as above.

## 2021-12-14 DIAGNOSIS — N200 Calculus of kidney: Secondary | ICD-10-CM | POA: Diagnosis not present

## 2021-12-14 DIAGNOSIS — N170 Acute kidney failure with tubular necrosis: Secondary | ICD-10-CM | POA: Diagnosis not present

## 2021-12-14 DIAGNOSIS — D511 Vitamin B12 deficiency anemia due to selective vitamin B12 malabsorption with proteinuria: Secondary | ICD-10-CM | POA: Diagnosis not present

## 2021-12-14 DIAGNOSIS — E559 Vitamin D deficiency, unspecified: Secondary | ICD-10-CM | POA: Diagnosis not present

## 2021-12-14 DIAGNOSIS — E8722 Chronic metabolic acidosis: Secondary | ICD-10-CM | POA: Diagnosis not present

## 2021-12-14 DIAGNOSIS — R809 Proteinuria, unspecified: Secondary | ICD-10-CM | POA: Diagnosis not present

## 2021-12-14 DIAGNOSIS — N189 Chronic kidney disease, unspecified: Secondary | ICD-10-CM | POA: Diagnosis not present

## 2021-12-14 DIAGNOSIS — G35 Multiple sclerosis: Secondary | ICD-10-CM | POA: Diagnosis not present

## 2021-12-14 DIAGNOSIS — E1122 Type 2 diabetes mellitus with diabetic chronic kidney disease: Secondary | ICD-10-CM | POA: Diagnosis not present

## 2021-12-14 DIAGNOSIS — D638 Anemia in other chronic diseases classified elsewhere: Secondary | ICD-10-CM | POA: Diagnosis not present

## 2021-12-14 DIAGNOSIS — E1129 Type 2 diabetes mellitus with other diabetic kidney complication: Secondary | ICD-10-CM | POA: Diagnosis not present

## 2021-12-16 ENCOUNTER — Ambulatory Visit (HOSPITAL_COMMUNITY)
Admission: RE | Admit: 2021-12-16 | Discharge: 2021-12-16 | Disposition: A | Discharge status: Home / Self Care | Payer: PPO | Source: Ambulatory Visit | Attending: Nephrology | Admitting: Nephrology

## 2021-12-16 DIAGNOSIS — N170 Acute kidney failure with tubular necrosis: Secondary | ICD-10-CM | POA: Insufficient documentation

## 2021-12-16 DIAGNOSIS — N1330 Unspecified hydronephrosis: Secondary | ICD-10-CM | POA: Diagnosis not present

## 2021-12-16 DIAGNOSIS — D638 Anemia in other chronic diseases classified elsewhere: Secondary | ICD-10-CM | POA: Diagnosis not present

## 2021-12-16 DIAGNOSIS — R809 Proteinuria, unspecified: Secondary | ICD-10-CM | POA: Diagnosis not present

## 2021-12-16 DIAGNOSIS — E1122 Type 2 diabetes mellitus with diabetic chronic kidney disease: Secondary | ICD-10-CM | POA: Insufficient documentation

## 2021-12-16 DIAGNOSIS — N179 Acute kidney failure, unspecified: Secondary | ICD-10-CM | POA: Diagnosis not present

## 2021-12-21 ENCOUNTER — Encounter: Payer: Self-pay | Admitting: Urology

## 2021-12-21 ENCOUNTER — Ambulatory Visit (INDEPENDENT_AMBULATORY_CARE_PROVIDER_SITE_OTHER): Payer: PPO | Admitting: Urology

## 2021-12-21 VITALS — BP 108/66 | HR 83

## 2021-12-21 DIAGNOSIS — N401 Enlarged prostate with lower urinary tract symptoms: Secondary | ICD-10-CM

## 2021-12-21 DIAGNOSIS — N390 Urinary tract infection, site not specified: Secondary | ICD-10-CM

## 2021-12-21 DIAGNOSIS — R319 Hematuria, unspecified: Secondary | ICD-10-CM

## 2021-12-21 DIAGNOSIS — R351 Nocturia: Secondary | ICD-10-CM | POA: Diagnosis not present

## 2021-12-21 DIAGNOSIS — N138 Other obstructive and reflux uropathy: Secondary | ICD-10-CM | POA: Diagnosis not present

## 2021-12-21 LAB — URINALYSIS, ROUTINE W REFLEX MICROSCOPIC
Bilirubin, UA: NEGATIVE
Ketones, UA: NEGATIVE
Nitrite, UA: NEGATIVE
Specific Gravity, UA: 1.015 (ref 1.005–1.030)
Urobilinogen, Ur: 0.2 mg/dL (ref 0.2–1.0)
pH, UA: 6.5 (ref 5.0–7.5)

## 2021-12-21 LAB — MICROSCOPIC EXAMINATION
Renal Epithel, UA: NONE SEEN /hpf
WBC, UA: >30 /hpf — AB (ref 0–5)

## 2021-12-21 MED ORDER — DOXYCYCLINE HYCLATE 100 MG PO CAPS: 100.0000 mg | ORAL_CAPSULE | Freq: Two times a day (BID) | ORAL | 0 refills | Status: DC

## 2021-12-21 NOTE — Patient Instructions (Signed)

## 2021-12-21 NOTE — Progress Notes (Signed)
12/21/2021 3:36 PM   Jacob Fowler Jan 26, 1951 [id number]  Referring provider: [name], [place],  [place] [address]  Followup BPH   HPI: Jacob Fowler is a 71yo here for followup for BPH and nocturia. He has new onset dysuria for the past 2-3 weeks. IPSS 25 QOL 4. Urine stream is weak. He has straining to urinate. Nocturia 2-4x. UA is concerning for infection.    PMH: Past Medical History:  Diagnosis Date   Acquired leg length discrepancy    Bladder stone    BPH associated with nocturia    CAD (coronary artery disease) CARDIOLOGIST- DR HOCHREIN--  LAST VISIT 09-27-2011 IN EPIC   Non obstructive Cath 2010-  HX CORONARY SPASM 2004   Cataract    bilateral    GERD (gastroesophageal reflux disease)    H/O hiatal hernia    Heart murmur    Heartburn    High cholesterol    History of nephrolithiasis 2009   Hx of adenomatous colonic polyps    Hyperlipidemia    Hypertension    Movement disorder    Multiple sclerosis ([place]) DX  10/10---  NEUROLOGIST  DR Delfino Lovett [facility] ([place])   RIGHT SIDE AFFECTED MORE W/ WEAKNESS- - USES CANE   Nephrolithiasis 08/30/2020   Neuromuscular disorder (HCC)    MS   NIDDM (non-insulin dependent diabetes mellitus)    Peyronie's disease    RBBB    Renal stone RIGHT   Rosacea    Vision abnormalities     Surgical History: 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 / NORMAL LVF   CYSTOSCOPY W/ RETROGRADES  10/30/2011   Procedure: CYSTOSCOPY WITH RETROGRADE PYELOGRAM;  Surgeon: Claybon Jabs, MD;  Location: [facility];  Service: Urology;  Laterality: Right;   CYSTOSCOPY WITH LITHOLAPAXY  10/30/2011   Procedure: CYSTOSCOPY WITH LITHOLAPAXY;  Surgeon: Claybon Jabs, MD;  Location: [facility];  Service: Urology;  Laterality: N/A;   CYSTOSCOPY WITH RETROGRADE PYELOGRAM,  URETEROSCOPY AND STENT PLACEMENT Left 11/25/2020   Procedure: CYSTOSCOPY WITH LEFT RETROGRADE PYELOGRAM, LEFT URETEROSCOPY WITH LASER AND LEFT URETERAL STENT PLACEMENT;  Surgeon: Cleon Gustin, MD;  Location: [facility];  Service: Urology;  Laterality: Left;   EXTRACORPOREAL SHOCK WAVE LITHOTRIPSY  09-05-2010   RIGHT   EYE SURGERY Right 08/30/2018   HOLMIUM LASER APPLICATION Left 0/93/8182   Procedure: HOLMIUM LASER APPLICATION;  Surgeon: Cleon Gustin, MD;  Location: [facility];  Service: Urology;  Laterality: Left;   NESBIT PROCEDURE  10-17-2004   CORRECTION OF PENILE ANGULATION (PEYRONIES DISEASE)    Home Medications:  Allergies as of 12/21/2021   No Known Allergies      Medication List        Accurate as of December 21, 2021  3:36 PM. If you have any questions, ask your nurse or doctor.          STOP taking these medications    alfuzosin 10 MG 24 hr tablet Commonly known as: UROXATRAL   amLODipine 2.5 MG tablet Commonly known as: NORVASC   cholecalciferol 1000 units tablet Commonly known as: VITAMIN D   clotrimazole-betamethasone cream Commonly known as: LOTRISONE   cyanocobalamin 1000 MCG/ML injection Commonly known as: (VITAMIN B-12)   fish oil-omega-3 fatty acids 1000 MG capsule   lisinopril 10 MG tablet Commonly known as: ZESTRIL  metFORMIN 1000 MG tablet Commonly known as: GLUCOPHAGE   pioglitazone 30 MG tablet Commonly known as: ACTOS   rosuvastatin 10 MG tablet Commonly known as: CRESTOR   tamsulosin 0.4 MG Caps capsule Commonly known as: FLOMAX       TAKE these medications    aspirin EC 81 MG tablet Take 81 mg by mouth daily.   finasteride 5 MG tablet Commonly known as: PROSCAR Take 1 tablet (5 mg total) by mouth daily.   glucose blood test strip Use as instructed   isosorbide mononitrate 60 MG 24 hr tablet Commonly known as: IMDUR TAKE 1/2 TABLET ('30MG'$  TOTAL) BY MOUTH DAILY   levothyroxine 75 MCG tablet Commonly known as:  SYNTHROID TAKE ONE TABLET (75MCG TOTAL) BY MOUTH DAILY BEFORE BREAKFAST   omeprazole 20 MG capsule Commonly known as: PRILOSEC TAKE ONE CAPSULE ('20MG'$  TOTAL) BY MOUTH DAILY   Ozempic (0.25 or 0.5 MG/DOSE) 2 MG/1.5ML Sopn Generic drug: Semaglutide(0.25 or 0.'5MG'$ /DOS) Inject 0.25 mg into the skin once a week.   silodosin 8 MG Caps capsule Commonly known as: RAPAFLO Take 1 capsule (8 mg total) by mouth at bedtime.   sodium bicarbonate 650 MG tablet Take by mouth.   SYRINGE-NEEDLE (DISP) 3 ML 25G X 5/8" 3 ML Misc Commonly known as: B-D SYRINGE/NEEDLE 3CC/25GX5/8 Use to give once a month B12 injections        Allergies: No Known Allergies  Family History: Family History  Problem Relation Age of Onset   Stroke Mother    Heart attack Mother    Diabetes Mother    Hypertension Mother    Renal cancer Father    Diabetes Sister    Heart attack Brother    Diabetes Son    Heart attack Maternal Grandfather    Heart attack Brother    Bladder Cancer Brother     Social History:  reports that he quit smoking about 48 years ago. His smoking use included cigarettes. He has a 7.00 pack-year smoking history. His smokeless tobacco use includes chew. He reports that he does not drink alcohol and does not use drugs.  ROS: All other review of systems were reviewed and are negative except what is noted above in HPI  Physical Exam: BP 108/66   Pulse 83   Constitutional:  Alert and oriented, No acute distress. HEENT: [place] AT, moist mucus membranes.  Trachea midline, no masses. Cardiovascular: No clubbing, cyanosis, or edema. Respiratory: Normal respiratory effort, no increased work of breathing. GI: Abdomen is soft, nontender, nondistended, no abdominal masses GU: No CVA tenderness.  Lymph: No cervical or inguinal lymphadenopathy. Skin: No rashes, bruises or suspicious lesions. Neurologic: Grossly intact, no focal deficits, moving all 4 extremities. Psychiatric: Normal mood and  affect.  Laboratory Data: Lab Results  Component Value Date   WBC 9.4 11/30/2021   HGB 10.8 (L) 11/30/2021   HCT 33.7 (L) 11/30/2021   MCV 85 11/30/2021   PLT 354 11/30/2021    Lab Results  Component Value Date   CREATININE 2.65 (H) 12/05/2021    Lab Results  Component Value Date   PSA 5.5 (H) 01/28/2014   PSA 3.37 01/06/2013   PSA 3.08 10/14/2012    No results found for: TESTOSTERONE  Lab Results  Component Value Date   HGBA1C 9.0 (H) 11/30/2021    Urinalysis    Component Value Date/Time   COLORURINE YELLOW 11/30/2020 0914   APPEARANCEUR Clear 10/03/2021 1604   LABSPEC 1.016 11/30/2020 0914   PHURINE 6.0 11/30/2020 0914  GLUCOSEU Negative 10/03/2021 1604   HGBUR LARGE (A) 11/30/2020 0914   BILIRUBINUR Negative 10/03/2021 [place] 11/30/2020 0914   PROTEINUR 2+ (A) 10/03/2021 1604   PROTEINUR 30 (A) 11/30/2020 0914   UROBILINOGEN negative 12/18/2014 1601   UROBILINOGEN 1.0 04/06/2010 0509   NITRITE Negative 10/03/2021 1604   NITRITE NEGATIVE 11/30/2020 0914   LEUKOCYTESUR 2+ (A) 10/03/2021 1604   LEUKOCYTESUR MODERATE (A) 11/30/2020 0914    Lab Results  Component Value Date   LABMICR 458.0 12/05/2021   WBCUA >30 (A) 10/03/2021   RBCUA >30 (A) 08/07/2018   LABEPIT 0-10 10/03/2021   MUCUS Present 10/03/2021   BACTERIA Few 10/03/2021    Pertinent Imaging:  Results for orders placed during the hospital encounter of 10/30/11  KUB day of procedure  Narrative *RADIOLOGY REPORT*  Clinical Data: Right ureteral stone  ABDOMEN - 1 VIEW  Comparison: [facility] CT urogram dated 10/17/2011  Findings: Prior distal right ureteral calculus is not definitely visualized on the current radiograph.  Known nonobstructing right lower pole calculi are also not radiographically apparent.  Calcified phleboliths in the pelvis.  Linear calcification overlying the right sacrum corresponds to a vascular calcification.  Suspected radiopaque  ingested tablets in the splenic flexure, sigmoid colon (overlying superior bladder), and cecum.  Degenerative changes of the visualized thoracolumbar spine.  IMPRESSION: Prior distal right ureteral calculus is not definitely visualized on the current radiograph.  Known nonobstructing right lower pole calculi are also not radiographically apparent.  Original Report Authenticated By: Julian Hy, M.D.  No results found for this or any previous visit.  No results found for this or any previous visit.  No results found for this or any previous visit.  Results for orders placed during the hospital encounter of 12/16/21  US RENAL  Narrative CLINICAL DATA:  71 year old male with diabetes, acute renal insufficiency superimposed on chronic kidney disease.  EXAM: RENAL / URINARY TRACT ULTRASOUND COMPLETE  COMPARISON:  Noncontrast CT Abdomen and Pelvis 01/13/2021.  Renal ultrasound 06/07/2021.  FINDINGS: Right Kidney:  Renal measurements: 12.6 x 6.4 x 5.8 cm = volume: 241 mL. Echogenicity within normal limits. No mass or hydronephrosis visualized.  Left Kidney:  Renal measurements: 10.4 x 5.2 x 5.4 cm = volume: 151 mL. Left hydronephrosis is mild 2 moderate and new since November. Similar appearance on the CT last year although double-J ureteral stent in place at that time. No left nephrolithiasis is evident.  Bladder:  Dependent urinary debris within the bladder (image 40). Estimated postvoid bladder volume of 94 mL. No definite bladder wall thickening.  Other:  Chronic hepatic steatosis.  IMPRESSION: 1. Recurrent left hydronephrosis, mild-to-moderate. Urinary debris within the bladder, but no obstructing etiology evident by Ultrasound. 2. Postvoid bladder residual of 94 mL. 3. Stable and negative right kidney. 4. Chronic hepatic steatosis.   Electronically Signed By: Genevie Ann M.D. On: 12/19/2021 09:00  No results found for this or any previous  visit.  No results found for this or any previous visit.  Results for orders placed during the hospital encounter of 01/13/21  CT RENAL STONE STUDY  Narrative CLINICAL DATA:  Right-sided back and flank pain for several months. Urinoma.  EXAM: CT ABDOMEN AND PELVIS WITHOUT CONTRAST  TECHNIQUE: Multidetector CT imaging of the abdomen and pelvis was performed following the standard protocol without IV contrast.  COMPARISON:  12/03/2020  FINDINGS: Lower chest: No acute findings.  Hepatobiliary: No mass visualized on this unenhanced exam. Gallbladder is unremarkable. No evidence  of biliary ductal dilatation.  Pancreas: No mass or inflammatory process visualized on this unenhanced exam.  Spleen:  Within normal limits in size.  Adrenals/Urinary tract: A few tiny less than 5 mm renal calculi are again seen bilaterally. Left ureteral stent remains in appropriate position, and there is no evidence of hydronephrosis. Previously seen left perinephric and retroperitoneal fluid collection has nearly completely resolved since prior study. Mild diffuse bladder wall thickening is again seen, likely due to chronic bladder outlet obstruction given enlarged prostate.  Stomach/Bowel: Tiny hiatal hernia noted. No evidence of obstruction, inflammatory process, or abnormal fluid collections. Normal appendix visualized.  Vascular/Lymphatic: No pathologically enlarged lymph nodes identified. No evidence of abdominal aortic aneurysm. Aortic atherosclerotic calcification noted.  Reproductive:  Mildly enlarged prostate again noted.  Other:  None.  Musculoskeletal:  No suspicious bone lesions identified.  IMPRESSION: Left ureteral stent in appropriate position. No evidence of hydronephrosis or other acute findings.  Near complete resolution of left perinephric and retroperitoneal fluid collection.  Tiny nonobstructing bilateral renal calculi.  Stable mildly enlarged prostate and  findings of chronic bladder outlet obstruction.  Tiny hiatal hernia.  Aortic Atherosclerosis (ICD10-I70.0).   Electronically Signed By: Marlaine Hind M.D. On: 01/14/2021 17:17   Assessment & Plan:    1. Nocturia -continue rapaflo and finasteride - Urinalysis, Routine w reflex microscopic  2. Benign prostatic hyperplasia with urinary obstruction -Continue rapaflo and finasteride  3. Acute cystitis -Urine for culture -doxycycline '100mg'$  BID for 7 days   No follow-ups on file.  Nicolette Bang, MD  [facility] [place]

## 2021-12-22 ENCOUNTER — Other Ambulatory Visit: Payer: Self-pay | Admitting: Family Medicine

## 2021-12-22 DIAGNOSIS — E119 Type 2 diabetes mellitus without complications: Secondary | ICD-10-CM

## 2021-12-23 LAB — URINE CULTURE

## 2021-12-23 NOTE — Telephone Encounter (Signed)
Please make patient aware dose has been increased to 0.5 mg weekly.

## 2021-12-24 DIAGNOSIS — R768 Other specified abnormal immunological findings in serum: Secondary | ICD-10-CM | POA: Diagnosis not present

## 2021-12-24 DIAGNOSIS — G35 Multiple sclerosis: Secondary | ICD-10-CM | POA: Diagnosis not present

## 2021-12-24 DIAGNOSIS — N170 Acute kidney failure with tubular necrosis: Secondary | ICD-10-CM | POA: Diagnosis not present

## 2021-12-24 DIAGNOSIS — E211 Secondary hyperparathyroidism, not elsewhere classified: Secondary | ICD-10-CM | POA: Diagnosis not present

## 2021-12-24 DIAGNOSIS — R809 Proteinuria, unspecified: Secondary | ICD-10-CM | POA: Diagnosis not present

## 2021-12-24 DIAGNOSIS — D508 Other iron deficiency anemias: Secondary | ICD-10-CM | POA: Diagnosis not present

## 2021-12-24 DIAGNOSIS — N1339 Other hydronephrosis: Secondary | ICD-10-CM | POA: Diagnosis not present

## 2021-12-24 DIAGNOSIS — N200 Calculus of kidney: Secondary | ICD-10-CM | POA: Diagnosis not present

## 2021-12-24 DIAGNOSIS — N189 Chronic kidney disease, unspecified: Secondary | ICD-10-CM | POA: Diagnosis not present

## 2021-12-24 DIAGNOSIS — R82991 Hypocitraturia: Secondary | ICD-10-CM | POA: Diagnosis not present

## 2021-12-24 DIAGNOSIS — D638 Anemia in other chronic diseases classified elsewhere: Secondary | ICD-10-CM | POA: Diagnosis not present

## 2021-12-26 ENCOUNTER — Telehealth: Payer: Self-pay | Admitting: Family Medicine

## 2021-12-26 NOTE — Telephone Encounter (Signed)
Pts daughter called to let PCP know that pt went to see the kidney specialist on 12/15/21 and had labs done and was told to call and let his PCP know that his A1C was 10

## 2022-01-02 ENCOUNTER — Ambulatory Visit (INDEPENDENT_AMBULATORY_CARE_PROVIDER_SITE_OTHER): Payer: PPO | Admitting: Physician Assistant

## 2022-01-02 ENCOUNTER — Encounter: Payer: Self-pay | Admitting: Physician Assistant

## 2022-01-02 VITALS — BP 116/73 | HR 108 | Temp 97.7°F

## 2022-01-02 DIAGNOSIS — E119 Type 2 diabetes mellitus without complications: Secondary | ICD-10-CM

## 2022-01-02 MED ORDER — OZEMPIC (0.25 OR 0.5 MG/DOSE) 2 MG/3ML ~~LOC~~ SOPN: 0.5000 mg | PEN_INJECTOR | SUBCUTANEOUS | 2 refills | Status: DC

## 2022-01-02 NOTE — Patient Instructions (Signed)

## 2022-01-02 NOTE — Telephone Encounter (Signed)
Daughter is calling. She states she called last Monday and never heard anything back from [place]. She states they went to kidney doctor and they took him off all Diabetic medications. She states that his Sugars have been elevated and this morning his Blood Sugar was 358. Patient's daughter is concerns on what to do.   Please advise.   Covering for PCP

## 2022-01-02 NOTE — Telephone Encounter (Signed)
BS this AM was 358. Please call daughter C# [phone]. Has been taken off all diabetic medication due to kidneys.

## 2022-01-02 NOTE — Telephone Encounter (Signed)
I do not know why they took him off all of the medicines, there may have been a reason.  Please get him an appointment ASAP to see somebody to restart some diabetes medicines.

## 2022-01-02 NOTE — Telephone Encounter (Signed)
Pt is here now for appt - closing encounter

## 2022-01-02 NOTE — Progress Notes (Signed)
  Subjective:     [name], male   DOB: 1951-06-27, 71 y.o.   MRN: [id number]  Diabetes   Pt here for discussion of his diabetes/elevated blood sugar readings Pt with recent hx of sig elevation of Creat Due to this his Glucophage was stopped and referred to Neph Pt also placed on Ozempic Pt went to the [place] and since his renal function has improved but family concerned regarding elevated BS readings recently Stated readings elevated since changing meds Family says they will run from 200-to the mid 11's Pt also admits to not following his diabetic diet closely  Review of Systems  Constitutional: Negative.   Respiratory: Negative.    Cardiovascular: Negative.        Objective:   Physical Exam Vitals and nursing note reviewed. Exam conducted with a chaperone present (Daughter present).  Constitutional:      General: He is not in acute distress.    Appearance: Normal appearance. He is not ill-appearing or toxic-appearing.  Neurological:     Mental Status: He is alert.        Assessment:     1. Diabetes mellitus type 2 in nonobese [facility])        Plan:     Discussed with pt why the other meds were stopped and why we do not want to restart them Rf of Ozempic done today Discussed pt would probably benefit with transition to insulin type meds Discussed really watching diet and importance of water intake Keep f/u with specialist  Appt with regular provider on Wednesday PATP

## 2022-01-03 ENCOUNTER — Other Ambulatory Visit: Payer: Self-pay | Admitting: Family Medicine

## 2022-01-03 DIAGNOSIS — E1159 Type 2 diabetes mellitus with other circulatory complications: Secondary | ICD-10-CM

## 2022-01-03 DIAGNOSIS — I152 Hypertension secondary to endocrine disorders: Secondary | ICD-10-CM

## 2022-01-04 ENCOUNTER — Ambulatory Visit (INDEPENDENT_AMBULATORY_CARE_PROVIDER_SITE_OTHER): Payer: PPO | Admitting: Family Medicine

## 2022-01-04 ENCOUNTER — Encounter: Payer: Self-pay | Admitting: Family Medicine

## 2022-01-04 VITALS — BP 114/74 | HR 90 | Temp 98.0°F | Ht 66.0 in

## 2022-01-04 DIAGNOSIS — E119 Type 2 diabetes mellitus without complications: Secondary | ICD-10-CM | POA: Diagnosis not present

## 2022-01-04 NOTE — Progress Notes (Unsigned)
   Assessment & Plan:  ***  Follow up plan: Return in about 4 weeks (around 02/01/2022) for DM.  Hendricks Limes, MSN, APRN, FNP-C [facility]  Subjective:   [name], male    DOB: 1950/12/29, 71 y.o.   MRN: [id number]  HPI: Jacob Fowler is a 71 y.o. male presenting on 01/04/2022 for Diabetes (BS has been running around 380 )  339 this AM  ***   ROS: Negative unless specifically indicated above in HPI.   Relevant past medical history reviewed and updated as indicated.   Allergies and medications reviewed and updated.   Current Outpatient Medications:    aspirin 81 MG EC tablet, Take 81 mg by mouth daily., Disp: , Rfl:    finasteride (PROSCAR) 5 MG tablet, Take 1 tablet (5 mg total) by mouth daily., Disp: 90 tablet, Rfl: 3   glucose blood test strip, Use as instructed, Disp: 100 each, Rfl: 12   isosorbide mononitrate (IMDUR) 60 MG 24 hr tablet, TAKE 1/2 TABLET ('30MG'$  TOTAL) BY MOUTH DAILY, Disp: 45 tablet, Rfl: 1   levothyroxine (SYNTHROID) 75 MCG tablet, TAKE ONE TABLET (75MCG TOTAL) BY MOUTH DAILY BEFORE BREAKFAST, Disp: 90 tablet, Rfl: 3   omeprazole (PRILOSEC) 20 MG capsule, TAKE ONE CAPSULE ('20MG'$  TOTAL) BY MOUTH DAILY, Disp: 90 capsule, Rfl: 1   Semaglutide,0.25 or 0.'5MG'$ /DOS, (OZEMPIC, 0.25 OR 0.5 MG/DOSE,) 2 MG/3ML SOPN, Inject 0.5 mg into the skin once a week., Disp: 3 mL, Rfl: 2   silodosin (RAPAFLO) 8 MG CAPS capsule, Take 1 capsule (8 mg total) by mouth at bedtime., Disp: 30 capsule, Rfl: 11   sodium bicarbonate 650 MG tablet, Take by mouth., Disp: , Rfl:    SYRINGE-NEEDLE, DISP, 3 ML (B-D SYRINGE/NEEDLE 3CC/25GX5/8) 25G X 5/8" 3 ML MISC, Use to give once a month B12 injections, Disp: 12 each, Rfl: 0  No Known Allergies  Objective:   BP 114/74   Pulse 90   Temp 98 F (36.7 C) (Temporal)   Ht '5\' 6"'$  (1.676 m)   SpO2 93%   BMI 24.86 kg/m    Physical Exam

## 2022-01-07 ENCOUNTER — Emergency Department (HOSPITAL_COMMUNITY)
Admission: EM | Admit: 2022-01-07 | Discharge: 2022-01-07 | Disposition: A | Discharge status: Home / Self Care | Payer: PPO | Attending: Emergency Medicine | Admitting: Emergency Medicine

## 2022-01-07 ENCOUNTER — Emergency Department (HOSPITAL_COMMUNITY): Payer: PPO

## 2022-01-07 ENCOUNTER — Encounter (HOSPITAL_COMMUNITY): Payer: Self-pay | Admitting: *Deleted

## 2022-01-07 ENCOUNTER — Other Ambulatory Visit: Payer: Self-pay

## 2022-01-07 DIAGNOSIS — E871 Hypo-osmolality and hyponatremia: Secondary | ICD-10-CM | POA: Diagnosis not present

## 2022-01-07 DIAGNOSIS — E039 Hypothyroidism, unspecified: Secondary | ICD-10-CM | POA: Insufficient documentation

## 2022-01-07 DIAGNOSIS — R269 Unspecified abnormalities of gait and mobility: Secondary | ICD-10-CM | POA: Diagnosis not present

## 2022-01-07 DIAGNOSIS — R739 Hyperglycemia, unspecified: Secondary | ICD-10-CM | POA: Diagnosis not present

## 2022-01-07 DIAGNOSIS — E1165 Type 2 diabetes mellitus with hyperglycemia: Secondary | ICD-10-CM | POA: Insufficient documentation

## 2022-01-07 DIAGNOSIS — Z7982 Long term (current) use of aspirin: Secondary | ICD-10-CM | POA: Diagnosis not present

## 2022-01-07 DIAGNOSIS — E162 Hypoglycemia, unspecified: Secondary | ICD-10-CM | POA: Diagnosis not present

## 2022-01-07 DIAGNOSIS — E161 Other hypoglycemia: Secondary | ICD-10-CM | POA: Diagnosis not present

## 2022-01-07 DIAGNOSIS — I10 Essential (primary) hypertension: Secondary | ICD-10-CM | POA: Diagnosis not present

## 2022-01-07 DIAGNOSIS — I2510 Atherosclerotic heart disease of native coronary artery without angina pectoris: Secondary | ICD-10-CM | POA: Insufficient documentation

## 2022-01-07 DIAGNOSIS — N3000 Acute cystitis without hematuria: Secondary | ICD-10-CM | POA: Insufficient documentation

## 2022-01-07 DIAGNOSIS — R4182 Altered mental status, unspecified: Secondary | ICD-10-CM | POA: Diagnosis not present

## 2022-01-07 DIAGNOSIS — R8289 Other abnormal findings on cytological and histological examination of urine: Secondary | ICD-10-CM | POA: Diagnosis present

## 2022-01-07 DIAGNOSIS — D649 Anemia, unspecified: Secondary | ICD-10-CM | POA: Diagnosis not present

## 2022-01-07 DIAGNOSIS — R944 Abnormal results of kidney function studies: Secondary | ICD-10-CM | POA: Diagnosis not present

## 2022-01-07 DIAGNOSIS — R531 Weakness: Secondary | ICD-10-CM | POA: Diagnosis not present

## 2022-01-07 LAB — URINALYSIS, ROUTINE W REFLEX MICROSCOPIC
Bacteria, UA: NONE SEEN
Bilirubin Urine: NEGATIVE
Glucose, UA: >=500 mg/dL — AB
Ketones, ur: NEGATIVE mg/dL
Nitrite: NEGATIVE
Protein, ur: 100 mg/dL — AB
Specific Gravity, Urine: 1.009 (ref 1.005–1.030)
WBC, UA: >50 WBC/hpf — ABNORMAL HIGH (ref 0–5)
pH: 7 (ref 5.0–8.0)

## 2022-01-07 LAB — CBC WITH DIFFERENTIAL/PLATELET
Abs Immature Granulocytes: 0.03 10*3/uL (ref 0.00–0.07)
Basophils Absolute: 0 10*3/uL (ref 0.0–0.1)
Basophils Relative: 1 %
Eosinophils Absolute: 0 10*3/uL (ref 0.0–0.5)
Eosinophils Relative: 1 %
HCT: 32.8 % — ABNORMAL LOW (ref 39.0–52.0)
Hemoglobin: 10.8 g/dL — ABNORMAL LOW (ref 13.0–17.0)
Immature Granulocytes: 1 %
Lymphocytes Relative: 10 %
Lymphs Abs: 0.6 10*3/uL — ABNORMAL LOW (ref 0.7–4.0)
MCH: 28.3 pg (ref 26.0–34.0)
MCHC: 32.9 g/dL (ref 30.0–36.0)
MCV: 85.9 fL (ref 80.0–100.0)
Monocytes Absolute: 0.6 10*3/uL (ref 0.1–1.0)
Monocytes Relative: 11 %
Neutro Abs: 4.7 10*3/uL (ref 1.7–7.7)
Neutrophils Relative %: 76 %
Platelets: 253 10*3/uL (ref 150–400)
RBC: 3.82 MIL/uL — ABNORMAL LOW (ref 4.22–5.81)
RDW: 16 % — ABNORMAL HIGH (ref 11.5–15.5)
WBC: 6 10*3/uL (ref 4.0–10.5)
nRBC: 0 % (ref 0.0–0.2)

## 2022-01-07 LAB — BETA-HYDROXYBUTYRIC ACID: Beta-Hydroxybutyric Acid: 0.2 mmol/L (ref 0.05–0.27)

## 2022-01-07 LAB — COMPREHENSIVE METABOLIC PANEL
ALT: 22 U/L (ref 0–44)
AST: 17 U/L (ref 15–41)
Albumin: 3.1 g/dL — ABNORMAL LOW (ref 3.5–5.0)
Alkaline Phosphatase: 97 U/L (ref 38–126)
Anion gap: 7 (ref 5–15)
BUN: 39 mg/dL — ABNORMAL HIGH (ref 8–23)
CO2: 25 mmol/L (ref 22–32)
Calcium: 9.4 mg/dL (ref 8.9–10.3)
Chloride: 100 mmol/L (ref 98–111)
Creatinine, Ser: 2.65 mg/dL — ABNORMAL HIGH (ref 0.61–1.24)
GFR, Estimated: 25 mL/min — ABNORMAL LOW (ref >60)
Glucose, Bld: 312 mg/dL — ABNORMAL HIGH (ref 70–99)
Potassium: 4.6 mmol/L (ref 3.5–5.1)
Sodium: 132 mmol/L — ABNORMAL LOW (ref 135–145)
Total Bilirubin: 0.5 mg/dL (ref 0.3–1.2)
Total Protein: 7.8 g/dL (ref 6.5–8.1)

## 2022-01-07 LAB — CBG MONITORING, ED: Glucose-Capillary: 294 mg/dL — ABNORMAL HIGH (ref 70–99)

## 2022-01-07 MED ORDER — CEPHALEXIN 500 MG PO CAPS: 500.0000 mg | ORAL_CAPSULE | Freq: Two times a day (BID) | ORAL | 0 refills | Status: DC

## 2022-01-07 MED ORDER — SODIUM CHLORIDE 0.9 % IV SOLN
1.0000 g | Freq: Once | INTRAVENOUS | Status: AC
  Administered 2022-01-07: 1 g via INTRAVENOUS
  Filled 2022-01-07: qty 10

## 2022-01-07 NOTE — ED Provider Notes (Signed)
[facility] EMERGENCY DEPARTMENT Provider Note   CSN: [id number] Arrival date & time: 01/07/22  9937     History  Chief Complaint  Patient presents with   Generalized Body Aches    Jacob Fowler is a 71 y.o. male.  HPI  Patient with medical history of multiple sclerosis, hypertension, hyperlipidemia, diabetes, RBBB, GERD, hypothyroid, CAD, BPH frequent UTIs presents today due to generalized weakness/AMS and hyperglycemia.    Patient was recently switched off of his main diabetic medicine secondary to recent acute renal failure, he is currently on Ozempic once weekly and started a month ago with slow titration upwards.  Concerned that his sugar has been high.  No other changes in medicine, he started feeling "fuzzy/off" 3 days ago, was seen by his primary 01/04/2022 for medication follow-up. Was baseline mentation at that time.   Yesterday his urine started looking cloudy, given 1 dose of doxycycline that they had from 2021 yesterday night and once this morning.    He was also per patient's wife "speaking out of his head ".  He was alert but his words were nonsensical, there is no slurring of words or facial droop.  This morning his speech is improved but he is still not at his baseline.  They state he knows who everyone is today but yesterday he did not recognize his daughter.  Patient lives independently with his wife.  Uses a wheelchair at baseline.  Limited mobility secondary to MS.  Home Medications Prior to Admission medications   Medication Sig Start Date End Date Taking? Authorizing Provider  aspirin 81 MG EC tablet Take 81 mg by mouth daily.    [provider]  cephALEXin (KEFLEX) 500 MG capsule Take 1 capsule (500 mg total) by mouth 2 (two) times daily. 01/07/22   Sherrill Raring, PA-C  finasteride (PROSCAR) 5 MG tablet Take 1 tablet (5 mg total) by mouth daily. 09/14/21   McKenzie, Candee Furbish, MD  glucose blood test strip Use as instructed 07/06/14   Chipper Herb, MD  isosorbide mononitrate (IMDUR) 60 MG 24 hr tablet TAKE 1/2 TABLET ('30MG'$  TOTAL) BY MOUTH DAILY 01/03/22   Hendricks Limes F, FNP  levothyroxine (SYNTHROID) 75 MCG tablet TAKE ONE TABLET (75MCG TOTAL) BY MOUTH DAILY BEFORE BREAKFAST 05/27/21   Hendricks Limes F, FNP  omeprazole (PRILOSEC) 20 MG capsule TAKE ONE CAPSULE ('20MG'$  TOTAL) BY MOUTH DAILY 07/25/21   [name], FNP  Semaglutide,0.25 or 0.'5MG'$ /DOS, (OZEMPIC, 0.25 OR 0.5 MG/DOSE,) 2 MG/3ML SOPN Inject 0.5 mg into the skin once a week. 01/02/22   Lodema Pilot, PA-C  silodosin (RAPAFLO) 8 MG CAPS capsule Take 1 capsule (8 mg total) by mouth at bedtime. 09/14/21   McKenzie, Candee Furbish, MD  sodium bicarbonate 650 MG tablet Take by mouth. 12/08/21 01/07/22  [provider]  SYRINGE-NEEDLE, DISP, 3 ML (B-D SYRINGE/NEEDLE 3CC/25GX5/8) 25G X 5/8" 3 ML MISC Use to give once a month B12 injections 12/28/17   Chipper Herb, MD  atorvastatin (LIPITOR) 10 MG tablet Take 10 mg by mouth daily.  09/27/11  [provider]  niacin-simvastatin (SIMCOR) 500-20 MG 24 hr tablet Take 1 tablet by mouth at bedtime.    09/27/11  [provider]      Allergies    Patient has no known allergies.    Review of Systems   Review of Systems  Physical Exam Updated Vital Signs BP (!) 119/92   Pulse 85   Temp 98.4 F (36.9 C) (  Oral)   Resp (!) 24   Ht '5\' 6"'$  (1.676 m)   Wt 70.8 kg   SpO2 99%   BMI 25.18 kg/m  Physical Exam Vitals and nursing note reviewed. Exam conducted with a chaperone present.  Constitutional:      Appearance: Normal appearance.  HENT:     Head: Normocephalic and atraumatic.  Eyes:     General: No scleral icterus.       Right eye: No discharge.        Left eye: No discharge.     Extraocular Movements: Extraocular movements intact.     Pupils: Pupils are equal, round, and reactive to light.  Cardiovascular:     Rate and Rhythm: Normal rate and regular rhythm.     Pulses: Normal pulses.     Heart  sounds: Normal heart sounds. No murmur heard.    No friction rub. No gallop.  Pulmonary:     Effort: Pulmonary effort is normal. No respiratory distress.     Breath sounds: Normal breath sounds.  Abdominal:     General: Abdomen is flat. Bowel sounds are normal. There is no distension.     Palpations: Abdomen is soft.     Tenderness: There is no abdominal tenderness.     Comments: Abdomen soft and nontender  Skin:    General: Skin is warm and dry.     Coloration: Skin is not jaundiced.  Neurological:     Mental Status: He is alert. Mental status is at baseline.     Cranial Nerves: No cranial nerve deficit.     Motor: Weakness present.     Coordination: Coordination normal.     Gait: Gait abnormal.     Comments: Patient is oriented to self, month, year, place.  Cranial nerves III through XII are grossly intact, grip strength is equal bilaterally.  No pronator drift.  Limited movement of left lower extremity, unable to raise right lower extremity (baseline).       ED Results / Procedures / Treatments   Labs (all labs ordered are listed, but only abnormal results are displayed) Labs Reviewed  URINALYSIS, ROUTINE W REFLEX MICROSCOPIC - Abnormal; Notable for the following components:      Result Value   APPearance TURBID (*)    Glucose, UA >=500 (*)    Hgb urine dipstick SMALL (*)    Protein, ur 100 (*)    Leukocytes,Ua LARGE (*)    WBC, UA >50 (*)    All other components within normal limits  CBC WITH DIFFERENTIAL/PLATELET - Abnormal; Notable for the following components:   RBC 3.82 (*)    Hemoglobin 10.8 (*)    HCT 32.8 (*)    RDW 16.0 (*)    Lymphs Abs 0.6 (*)    All other components within normal limits  COMPREHENSIVE METABOLIC PANEL - Abnormal; Notable for the following components:   Sodium 132 (*)    Glucose, Bld 312 (*)    BUN 39 (*)    Creatinine, Ser 2.65 (*)    Albumin 3.1 (*)    GFR, Estimated 25 (*)    All other components within normal limits  CBG MONITORING,  ED - Abnormal; Notable for the following components:   Glucose-Capillary 294 (*)    All other components within normal limits  URINE CULTURE  BETA-HYDROXYBUTYRIC ACID    EKG EKG Interpretation  Date/Time:  Saturday January 07 2022 09:04:31 EDT Ventricular Rate:  89 PR Interval:  202 QRS Duration: 143 QT  Interval:  388 QTC Calculation: 473 R Axis:   -63 Text Interpretation: Sinus rhythm RBBB and LAFB No significant change since last tracing Confirmed by Fredia Sorrow [phone]) on 01/07/2022 10:40:41 AM  Radiology CT Head Wo Contrast  Result Date: 01/07/2022 CLINICAL DATA:  Mental status change, unknown cause. EXAM: CT HEAD WITHOUT CONTRAST TECHNIQUE: Contiguous axial images were obtained from the base of the skull through the vertex without intravenous contrast. RADIATION DOSE REDUCTION: This exam was performed according to the departmental dose-optimization program which includes automated exposure control, adjustment of the mA and/or kV according to patient size and/or use of iterative reconstruction technique. COMPARISON:  MR examination dated September 02, 2020 FINDINGS: Brain: No evidence of acute infarction, hemorrhage, hydrocephalus, extra-axial collection or mass lesion/mass effect. Mild cerebral atrophy and chronic microvascular ischemic changes of the white matter. Vascular: No hyperdense vessel or unexpected calcification. Skull: Normal. Negative for fracture or focal lesion. Sinuses/Orbits: No acute finding. Other: None. IMPRESSION: No acute intracranial abnormality. Electronically Signed   By: Keane Police D.O.   On: 01/07/2022 12:02   DG Chest Portable 1 View  Result Date: 01/07/2022 CLINICAL DATA:  Weakness.  Hyperglycemia. EXAM: PORTABLE CHEST 1 VIEW COMPARISON:  Nov 30, 2020 FINDINGS: The cardiomediastinal silhouette is stable. No pneumothorax. Mild bibasilar atelectasis. No other acute abnormalities. IMPRESSION: No active disease. Electronically Signed   By: Dorise Bullion III  M.D.   On: 01/07/2022 10:38    Procedures Procedures    Medications Ordered in ED Medications  cefTRIAXone (ROCEPHIN) 1 g in sodium chloride 0.9 % 100 mL IVPB (0 g Intravenous Stopped 01/07/22 1252)    ED Course/ Medical Decision Making/ A&P                           Medical Decision Making Amount and/or Complexity of Data Reviewed Independent Historian: spouse    Details: His wife, daughter and son are at bedside providing collateral information.  See HPI Labs: ordered. Radiology: ordered.  Risk Prescription drug management.   Patient presents due to altered mental status and hyperglycemia.  Differential is broad but includes and is not limited to DKA, electrolyte derangement, UTI, pyelonephritis, arrhythmia, anemia, pneumonia/infectious etiology, sepsis.  On exam patient does not have any focal deficits.  Currently, he does not appear confused but is acting deviant from his baseline.  His abdomen is soft, his rhythm is regular and lungs are clear to auscultation.  Vital signs are also reassuring.  No SIRS criteria and patient does not appear septic.  BP 126/77   Pulse 88   Temp 98.2 F (36.8 C)   Resp 18   Ht '5\' 6"'$  (1.676 m)   Wt 70.8 kg   SpO2 98%   BMI 25.18 kg/m   I reviewed external records as detailed in the HPI.  Specifically, I reviewed primary care visit and medication changes.  Also reviewed previous labs.  I ordered and reviewed laboratory work-up.  Patient does not have a leukocytosis, he is anemic at 10.8 but this is stable based on chart review.  There is no gross electrolyte derangement though he slightly hyponatremic at 132.  He is hyperglycemic at 312, potassium, chloride and bicarb are all within normal limits not consistent with DKA.  Notably, patient's creatinine is 2.65 which is baseline as of a month ago.  UA with finding suggestive of UTI.  I ordered and viewed chest x-ray.  No acute process, no signs of underlying pneumonia.  I ordered and  reviewed EKG.  Patient has left bundle branch block and right bundle branch block which are baseline compared to previous EKGs.  No new arrhythmias or ischemic findings based on my interpretation.  We will treat UTI with a single dose of Rocephin here in the ED.  I discussed renal dosing with the pharmacist who advises Keflex twice daily for 7 days.  Appreciate her consult.  I considered alternative etiology but ultimately I think UTI is most likely source here.  He is not septic, he is good outpatient follow-up and he is stable for trial of outpatient antibiotics and close follow-up outpatient.  Patient was discharged in stable condition.  Discussed HPI, physical exam and plan of care for this patient with attending Fredia Sorrow. The attending physician evaluated this patient as part of a shared visit and agrees with plan of care.         Final Clinical Impression(s) / ED Diagnoses Final diagnoses:  Acute cystitis without hematuria    Rx / DC Orders ED Discharge Orders          Ordered    cephALEXin (KEFLEX) 500 MG capsule  2 times daily,   Status:  Discontinued        01/07/22 1225    cephALEXin (KEFLEX) 500 MG capsule  2 times daily        01/07/22 1237              Sherrill Raring, PA-C 01/07/22 1918    Fredia Sorrow, MD 01/14/22 2332

## 2022-01-09 LAB — URINE CULTURE: Culture: 10000 — AB

## 2022-01-10 ENCOUNTER — Telehealth: Payer: Self-pay | Admitting: Emergency Medicine

## 2022-01-10 NOTE — Telephone Encounter (Signed)
Post ED Visit - Positive Culture Follow-up  Culture report reviewed by antimicrobial stewardship pharmacist: [facility] []  Enzo Bi, Pharm.D. []  Celedonio Miyamoto, Pharm.D., BCPS AQ-ID []  Garvin Fila, Pharm.D., BCPS []  Georgina Pillion, [address].D., BCPS []  [place], [address].D., BCPS, AAHIVP []  Estella Husk, Pharm.D., BCPS, AAHIVP []  Lysle Pearl, PharmD, BCPS []  Phillips Climes, PharmD, BCPS []  Agapito Games, PharmD, BCPS []  Verlan Friends, PharmD []  Mervyn Gay, PharmD, BCPS []  Vinnie Level, PharmD  [facility] []  Len Childs, PharmD []  Greer Pickerel, PharmD []  Adalberto Cole, PharmD []  Perlie Gold, Rph []  Lonell Face) Jean Rosenthal, PharmD []  Earl Many, PharmD []  Junita Push, PharmD []  Dorna Leitz, PharmD []  Terrilee Files, PharmD []  Lynann Beaver, PharmD []  Keturah Barre, PharmD []  Loralee Pacas, PharmD []  Bernadene Person, PharmD   Positive urine culture Treated with cephalexin, organism sensitive to the same and no further patient follow-up is required at this time.  Berle Mull 01/10/2022, 11:27 AM

## 2022-01-18 ENCOUNTER — Ambulatory Visit: Payer: PPO | Admitting: Urology

## 2022-01-25 ENCOUNTER — Ambulatory Visit: Payer: Self-pay | Admitting: *Deleted

## 2022-01-25 NOTE — Chronic Care Management (AMB) (Signed)
  Chronic Care Management   Note  01/25/2022 Name: Jacob Fowler MRN: [id number] DOB: [birth year]   Patient is stable from [facility] perspective or has not recently engaged with the [place]. I am removing RN Care Manager from Care Team and closing [place]. If patient is currently engaged with another CCM team member I will forward this encounter to inform them of my case closure. Patient may be eligible for re-engagement with RN Care Manager in the future if necessary and can discuss this with their PCP.  Chong Sicilian, BSN, RN-BC Embedded Chronic Care Manager [facility] [place] [facility] Direct Dial: [phone]

## 2022-01-27 ENCOUNTER — Other Ambulatory Visit: Payer: Self-pay | Admitting: Family Medicine

## 2022-02-01 ENCOUNTER — Ambulatory Visit (HOSPITAL_COMMUNITY)
Admission: RE | Admit: 2022-02-01 | Discharge: 2022-02-01 | Disposition: A | Discharge status: Home / Self Care | Payer: PPO | Source: Ambulatory Visit | Attending: Urology | Admitting: Urology

## 2022-02-01 DIAGNOSIS — N200 Calculus of kidney: Secondary | ICD-10-CM | POA: Diagnosis not present

## 2022-02-01 DIAGNOSIS — N1330 Unspecified hydronephrosis: Secondary | ICD-10-CM | POA: Diagnosis not present

## 2022-02-08 ENCOUNTER — Telehealth: Payer: Self-pay | Admitting: Family Medicine

## 2022-02-08 ENCOUNTER — Encounter: Payer: Self-pay | Admitting: Family Medicine

## 2022-02-08 ENCOUNTER — Ambulatory Visit (INDEPENDENT_AMBULATORY_CARE_PROVIDER_SITE_OTHER): Payer: PPO | Admitting: Family Medicine

## 2022-02-08 VITALS — BP 113/76 | HR 104 | Temp 98.0°F

## 2022-02-08 DIAGNOSIS — E1165 Type 2 diabetes mellitus with hyperglycemia: Secondary | ICD-10-CM | POA: Diagnosis not present

## 2022-02-08 MED ORDER — SEMAGLUTIDE (1 MG/DOSE) 4 MG/3ML ~~LOC~~ SOPN: 1.0000 mg | PEN_INJECTOR | SUBCUTANEOUS | 0 refills | Status: DC

## 2022-02-08 NOTE — Progress Notes (Signed)
Assessment & Plan:  1. Uncontrolled type 2 diabetes mellitus with hyperglycemia, without long-term current use of insulin (HCC) Uncontrolled but improving based on fasting glucose readings. Ozempic dose increased from 0.5 mg to 1 mg once weekly.  - Semaglutide, 1 MG/DOSE, 4 MG/3ML SOPN; Inject 1 mg as directed once a week.  Dispense: 3 mL; Refill: 0   Return in about 4 weeks (around 03/08/2022) for follow-up of chronic medication conditions with anybody.  Hendricks Limes, MSN, APRN, FNP-C [facility]  Subjective:    [name], male    DOB: 11-27-1950, 71 y.o.   MRN: [id number]  Patient Care Team: [name], FNP as PCP - General (Family Medicine) Sater, Nanine Means, MD (Neurology) Minus Breeding, MD as Consulting Physician (Cardiology) Celestia Khat, [place] (Optometry) McKenzie, Candee Furbish, MD as Consulting Physician (Urology)   Chief Complaint:  Chief Complaint  Patient presents with   Diabetes    4 week follow up     HPI: Jacob Fowler is a 71 y.o. male presenting on 02/08/2022 for Diabetes (4 week follow up )  Patient is here to follow-up on his diabetes.  He is accompanied by his wife who he is okay with being present.  At our previous visit on 11/30/2021 he was started on Ozempic 0.25 mg once weekly due to a sudden increase in his A1c from 6.8 up to 9.0.  At that time he was found to be in acute kidney failure at which time his metformin and pioglitazone were discontinued and his A1c increased to 10.1.  At our visit 4 weeks ago Ozempic was increased to 0.5 mg once weekly.  He reports today his fasting blood sugars have been 208-320, which is an improvement.  New complaints: None   Social history:  Relevant past medical, surgical, family and social history reviewed and updated as indicated. Interim medical history since our last visit reviewed.  Allergies and medications reviewed and updated.  DATA REVIEWED:  CHART IN EPIC  ROS: Negative unless specifically indicated above in HPI.    Current Outpatient Medications:    aspirin 81 MG EC tablet, Take 81 mg by mouth daily., Disp: , Rfl:    cyanocobalamin (,VITAMIN B-12,) 1000 MCG/ML injection, INJECT 1ML INTO THE SKIN ONCE A MONTH, Disp: 3 mL, Rfl: 0   finasteride (PROSCAR) 5 MG tablet, Take 1 tablet (5 mg total) by mouth daily., Disp: 90 tablet, Rfl: 3   glucose blood test strip, Use as instructed, Disp: 100 each, Rfl: 12   isosorbide mononitrate (IMDUR) 60 MG 24 hr tablet, TAKE 1/2 TABLET (30MG TOTAL) BY MOUTH DAILY, Disp: 45 tablet, Rfl: 1   levothyroxine (SYNTHROID) 75 MCG tablet, TAKE ONE TABLET (75MCG TOTAL) BY MOUTH DAILY BEFORE BREAKFAST, Disp: 90 tablet, Rfl: 3   omeprazole (PRILOSEC) 20 MG capsule, TAKE ONE CAPSULE (20MG TOTAL) BY MOUTH DAILY, Disp: 90 capsule, Rfl: 1   Semaglutide,0.25 or 0.5MG/DOS, (OZEMPIC, 0.25 OR 0.5 MG/DOSE,) 2 MG/3ML SOPN, Inject 0.5 mg into the skin once a week., Disp: 3 mL, Rfl: 2   silodosin (RAPAFLO) 8 MG CAPS capsule, Take 1 capsule (8 mg total) by mouth at bedtime., Disp: 30 capsule, Rfl: 11   sodium bicarbonate 650 MG tablet, Take 650 mg by mouth 4 (four) times daily., Disp: , Rfl:    No Known Allergies Past Medical History:  Diagnosis Date   Acquired leg length discrepancy    Bladder stone    BPH associated with nocturia  CAD (coronary artery disease) CARDIOLOGIST- DR HOCHREIN--  LAST VISIT 09-27-2011 IN EPIC   Non obstructive Cath 2010-  HX CORONARY SPASM 2004   Cataract    bilateral    GERD (gastroesophageal reflux disease)    H/O hiatal hernia    Heart murmur    Heartburn    High cholesterol    History of nephrolithiasis 2009   Hx of adenomatous colonic polyps    Hyperlipidemia    Hypertension    Movement disorder    Multiple sclerosis ([place]) DX  10/10---  NEUROLOGIST  DR Delfino Lovett [facility] ([place])   RIGHT SIDE AFFECTED MORE W/ WEAKNESS- - USES CANE   Nephrolithiasis 08/30/2020    Neuromuscular disorder (HCC)    MS   NIDDM (non-insulin dependent diabetes mellitus)    Peyronie's disease    RBBB    Renal stone RIGHT   Rosacea    Vision abnormalities     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 / NORMAL LVF   CYSTOSCOPY W/ RETROGRADES  10/30/2011   Procedure: CYSTOSCOPY WITH RETROGRADE PYELOGRAM;  Surgeon: Claybon Jabs, MD;  Location: [facility];  Service: Urology;  Laterality: Right;   CYSTOSCOPY WITH LITHOLAPAXY  10/30/2011   Procedure: CYSTOSCOPY WITH LITHOLAPAXY;  Surgeon: Claybon Jabs, MD;  Location: [facility];  Service: Urology;  Laterality: N/A;   CYSTOSCOPY WITH RETROGRADE PYELOGRAM, URETEROSCOPY AND STENT PLACEMENT Left 11/25/2020   Procedure: CYSTOSCOPY WITH LEFT RETROGRADE PYELOGRAM, LEFT URETEROSCOPY WITH LASER AND LEFT URETERAL STENT PLACEMENT;  Surgeon: Cleon Gustin, MD;  Location: [facility];  Service: Urology;  Laterality: Left;   EXTRACORPOREAL SHOCK WAVE LITHOTRIPSY  09-05-2010   RIGHT   EYE SURGERY Right 08/30/2018   HOLMIUM LASER APPLICATION Left 08/08/4823   Procedure: HOLMIUM LASER APPLICATION;  Surgeon: Cleon Gustin, MD;  Location: [facility];  Service: Urology;  Laterality: Left;   NESBIT PROCEDURE  10-17-2004   CORRECTION OF PENILE ANGULATION (PEYRONIES DISEASE)    Social History   Socioeconomic History   Marital status: Married    Spouse name: Hassan Rowan   Number of children: 2   Years of education: 13   Highest education level: Some college, no degree  Occupational History   Occupation: retired    Comment: self -employed farmer  Tobacco Use   Smoking status: Former    Packs/day: 1.00    Years: 7.00    Total pack years: 7.00    Types: Cigarettes    Quit date: 09/26/1973    Years since quitting: 48.4   Smokeless tobacco: Current    Types: Chew   Tobacco comments:    Chewing  tobacco  Vaping Use   Vaping Use: Never used  Substance and Sexual Activity   Alcohol use: No    Alcohol/week: 0.0 standard drinks of alcohol   Drug use: No   Sexual activity: Not on file  Other Topics Concern   Not on file  Social History Narrative   Lives with wife, has MS, mobility issues.   Social Determinants of Health   Financial Resource Strain: Low Risk  (11/18/2020)   Overall Financial Resource Strain (CARDIA)    Difficulty of Paying Living Expenses: Not hard at all  Food Insecurity: No Food Insecurity (11/18/2020)   Hunger Vital Sign    Worried About Running Out of Food in the Last Year: Never true  Ran Out of Food in the Last Year: Never true  Transportation Needs: No Transportation Needs (11/18/2020)   PRAPARE - Hydrologist (Medical): No    Lack of Transportation (Non-Medical): No  Physical Activity: Insufficiently Active (11/18/2020)   Exercise Vital Sign    Days of Exercise per Week: 4 days    Minutes of Exercise per Session: 20 min  Stress: No Stress Concern Present (11/18/2020)   [place]    Feeling of Stress : Not at all  Social Connections: Moderately Integrated (11/18/2020)   Social Connection and Isolation Panel [NHANES]    Frequency of Communication with Friends and Family: More than three times a week    Frequency of Social Gatherings with Friends and Family: More than three times a week    Attends Religious Services: More than 4 times per year    Active Member of [employer] or Organizations: No    Attends Archivist Meetings: Never    Marital Status: Married  Human resources officer Violence: Not At Risk (11/18/2020)   Humiliation, Afraid, Rape, and Kick questionnaire    Fear of Current or Ex-Partner: No    Emotionally Abused: No    Physically Abused: No    Sexually Abused: No        Objective:    BP 113/76   Pulse (!) 104   Temp 98 F (36.7 C) (Temporal)    SpO2 96%   Wt Readings from Last 3 Encounters:  01/07/22 156 lb (70.8 kg)  11/21/21 154 lb (69.9 kg)  09/14/21 154 lb (69.9 kg)    Physical Exam Vitals reviewed.  Constitutional:      General: He is not in acute distress.    Appearance: Normal appearance. He is not ill-appearing, toxic-appearing or diaphoretic.  HENT:     Head: Normocephalic and atraumatic.  Eyes:     General: No scleral icterus.       Right eye: No discharge.        Left eye: No discharge.     Conjunctiva/sclera: Conjunctivae normal.  Cardiovascular:     Rate and Rhythm: Normal rate.  Pulmonary:     Effort: Pulmonary effort is normal. No respiratory distress.  Musculoskeletal:        General: Normal range of motion.     Cervical back: Normal range of motion.  Skin:    General: Skin is warm and dry.  Neurological:     Mental Status: He is alert and oriented to person, place, and time. Mental status is at baseline.  Psychiatric:        Mood and Affect: Mood normal.        Behavior: Behavior normal.        Thought Content: Thought content normal.        Judgment: Judgment normal.     Lab Results  Component Value Date   TSH 4.240 11/30/2021   Lab Results  Component Value Date   WBC 6.0 01/07/2022   HGB 10.8 (L) 01/07/2022   HCT 32.8 (L) 01/07/2022   MCV 85.9 01/07/2022   PLT 253 01/07/2022   Lab Results  Component Value Date   NA 132 (L) 01/07/2022   K 4.6 01/07/2022   CO2 25 01/07/2022   GLUCOSE 312 (H) 01/07/2022   BUN 39 (H) 01/07/2022   CREATININE 2.65 (H) 01/07/2022   BILITOT 0.5 01/07/2022   ALKPHOS 97 01/07/2022   AST 17 01/07/2022  ALT 22 01/07/2022   PROT 7.8 01/07/2022   ALBUMIN 3.1 (L) 01/07/2022   CALCIUM 9.4 01/07/2022   ANIONGAP 7 01/07/2022   EGFR 25 (L) 12/05/2021   Lab Results  Component Value Date   CHOL 108 11/30/2021   Lab Results  Component Value Date   HDL 25 (L) 11/30/2021   Lab Results  Component Value Date   LDLCALC 38 11/30/2021   Lab Results   Component Value Date   TRIG 297 (H) 11/30/2021   Lab Results  Component Value Date   CHOLHDL 4.3 11/30/2021   Lab Results  Component Value Date   HGBA1C 9.0 (H) 11/30/2021

## 2022-02-08 NOTE — Telephone Encounter (Signed)
Patient requesting to switch to you since I am leaving. His wife currently sees you. I told them you were not accepting new patients but that I would at least ask.

## 2022-02-09 NOTE — Telephone Encounter (Signed)
Pt aware.

## 2022-02-09 NOTE — Telephone Encounter (Signed)
That is fine 

## 2022-02-09 NOTE — Telephone Encounter (Signed)
Lmtcb   When patient or wife calls back them them know he can schedule with MMM and make appointment - 30 mins

## 2022-02-20 ENCOUNTER — Ambulatory Visit: Payer: PPO | Admitting: Neurology

## 2022-02-27 ENCOUNTER — Other Ambulatory Visit: Payer: Self-pay | Admitting: Family Medicine

## 2022-02-27 ENCOUNTER — Other Ambulatory Visit: Payer: Self-pay | Admitting: *Deleted

## 2022-02-27 DIAGNOSIS — E1165 Type 2 diabetes mellitus with hyperglycemia: Secondary | ICD-10-CM

## 2022-02-27 NOTE — Patient Outreach (Signed)
  Care Coordination   02/27/2022 Name: Hilding Quintanar MRN: [id number] DOB: 08-30-1950   Care Coordination Outreach Attempts:  An unsuccessful telephone outreach was attempted today to offer the patient information about available care coordination services as a benefit of their health plan.   Follow Up Plan:  Additional outreach attempts will be made to offer the patient care coordination information and services.   Encounter Outcome:  No Answer  Care Coordination Interventions Activated:  No   Care Coordination Interventions:  No, not indicated    Elide Stalzer C. Myrtie Neither, MSN, [facility] Gerontological Nurse Practitioner [facility] Care Management [phone]

## 2022-03-01 ENCOUNTER — Ambulatory Visit (INDEPENDENT_AMBULATORY_CARE_PROVIDER_SITE_OTHER): Payer: PPO | Admitting: Family Medicine

## 2022-03-01 ENCOUNTER — Encounter: Payer: Self-pay | Admitting: Family Medicine

## 2022-03-01 VITALS — BP 115/72 | HR 91 | Temp 98.1°F

## 2022-03-01 DIAGNOSIS — G35 Multiple sclerosis: Secondary | ICD-10-CM

## 2022-03-01 DIAGNOSIS — I25118 Atherosclerotic heart disease of native coronary artery with other forms of angina pectoris: Secondary | ICD-10-CM | POA: Diagnosis not present

## 2022-03-01 DIAGNOSIS — K219 Gastro-esophageal reflux disease without esophagitis: Secondary | ICD-10-CM | POA: Diagnosis not present

## 2022-03-01 DIAGNOSIS — E039 Hypothyroidism, unspecified: Secondary | ICD-10-CM | POA: Diagnosis not present

## 2022-03-01 DIAGNOSIS — I152 Hypertension secondary to endocrine disorders: Secondary | ICD-10-CM | POA: Diagnosis not present

## 2022-03-01 DIAGNOSIS — E1169 Type 2 diabetes mellitus with other specified complication: Secondary | ICD-10-CM | POA: Diagnosis not present

## 2022-03-01 DIAGNOSIS — E1165 Type 2 diabetes mellitus with hyperglycemia: Secondary | ICD-10-CM | POA: Diagnosis not present

## 2022-03-01 DIAGNOSIS — I700 Atherosclerosis of aorta: Secondary | ICD-10-CM | POA: Diagnosis not present

## 2022-03-01 DIAGNOSIS — E538 Deficiency of other specified B group vitamins: Secondary | ICD-10-CM | POA: Diagnosis not present

## 2022-03-01 DIAGNOSIS — E1159 Type 2 diabetes mellitus with other circulatory complications: Secondary | ICD-10-CM | POA: Diagnosis not present

## 2022-03-01 DIAGNOSIS — E559 Vitamin D deficiency, unspecified: Secondary | ICD-10-CM | POA: Diagnosis not present

## 2022-03-01 DIAGNOSIS — E785 Hyperlipidemia, unspecified: Secondary | ICD-10-CM | POA: Diagnosis not present

## 2022-03-01 LAB — BAYER DCA HB A1C WAIVED: HB A1C (BAYER DCA - WAIVED): 9.4 % — ABNORMAL HIGH (ref 4.8–5.6)

## 2022-03-01 MED ORDER — SEMAGLUTIDE (2 MG/DOSE) 8 MG/3ML ~~LOC~~ SOPN: 2.0000 mg | PEN_INJECTOR | SUBCUTANEOUS | 1 refills | Status: DC

## 2022-03-01 MED ORDER — OMEPRAZOLE 20 MG PO CPDR: 20.0000 mg | DELAYED_RELEASE_CAPSULE | Freq: Every day | ORAL | 1 refills | Status: DC

## 2022-03-01 MED ORDER — ROSUVASTATIN CALCIUM 10 MG PO TABS: 10.0000 mg | ORAL_TABLET | Freq: Every day | ORAL | 1 refills | Status: DC

## 2022-03-01 NOTE — Patient Instructions (Addendum)
Schedule your eye exam.  Finish Ozempic 1 mg dose that you already have at home, then start the new 2 mg dose.  Start Rosuvastatin back.

## 2022-03-01 NOTE — Progress Notes (Signed)
Assessment & Plan:  1. Uncontrolled type 2 diabetes mellitus with hyperglycemia, without long-term current use of insulin (HCC) Lab Results  Component Value Date   HGBA1C 9.4 (H) 03/01/2022   HGBA1C 9.0 (H) 11/30/2021   HGBA1C 6.8 (H) 06/01/2021    - Diabetes is not at goal of A1c < 7, but is improving as he was actually as high as 10.1 recently. - Medications: continue current medications, with plan to increase to Ozempic 2 mg weekly after he has completed current supply of 1 mg weekly. - Home glucose monitoring: Continue monitoring fasting and 1 to 2 hours postprandial. - Patient is currently taking a statin (resuming today). Patient is not taking an ACE-inhibitor/ARB.  - Instruction/counseling given: reminded to get eye exam  Diabetes Health Maintenance Due  Topic Date Due   OPHTHALMOLOGY EXAM  08/31/2018   HEMOGLOBIN A1C  09/01/2022   FOOT EXAM  12/01/2022   URINE MICROALBUMIN  12/06/2022    Lab Results  Component Value Date   LABMICR See below: 12/21/2021   LABMICR 458.0 12/05/2021   MICROALBUR negative 02/19/2015   MICROALBUR neg 01/28/2014   - Lipid panel - CBC with Differential/Platelet - CMP14+EGFR - Bayer DCA Hb A1c Waived - rosuvastatin (CRESTOR) 10 MG tablet; Take 1 tablet (10 mg total) by mouth daily.  Dispense: 90 tablet; Refill: 1 - Semaglutide, 2 MG/DOSE, 8 MG/3ML SOPN; Inject 2 mg as directed once a week.  Dispense: 9 mL; Refill: 1  2. Hyperlipidemia associated with type 2 diabetes mellitus ([place]) Labs to assess.  Patient has been off of his statin, but is agreeable in resuming today. - Lipid panel - CBC with Differential/Platelet - CMP14+EGFR - VITAMIN D 25 Hydroxy (Vit-D Deficiency, Fractures) - rosuvastatin (CRESTOR) 10 MG tablet; Take 1 tablet (10 mg total) by mouth daily.  Dispense: 90 tablet; Refill: 1  3. Aortic atherosclerosis (HCC) Resuming statin.  Continue aspirin. - Lipid panel - CMP14+EGFR  4. Atherosclerosis of native coronary artery  of native heart with stable angina pectoris (HCC) Resuming statin.  Continue aspirin. - Lipid panel - CMP14+EGFR  5. Hypertension associated with type 2 diabetes mellitus ([place]) Well controlled on current regimen.  - Lipid panel - CBC with Differential/Platelet - CMP14+EGFR  6. Gastroesophageal reflux disease, unspecified whether esophagitis present Well controlled on current regimen.  - CMP14+EGFR - omeprazole (PRILOSEC) 20 MG capsule; Take 1 capsule (20 mg total) by mouth daily.  Dispense: 90 capsule; Refill: 1  7. Acquired hypothyroidism Well controlled on current regimen.   8. Multiple sclerosis ([place]) Managed by neurology.  9. Vitamin D deficiency Well controlled on current regimen.   10. B12 deficiency Well controlled on current regimen.    Return in about 6 weeks (around 04/12/2022) for DM with MMM.  Hendricks Limes, MSN, APRN, FNP-C [facility]  Subjective:    [name], male    DOB: 14-Oct-1950, 71 y.o.   MRN: [id number]  Patient Care Team: [name], FNP as PCP - General (Family Medicine) Sater, Nanine Means, MD (Neurology) Minus Breeding, MD as Consulting Physician (Cardiology) Celestia Khat, [place] (Optometry) McKenzie, Candee Furbish, MD as Consulting Physician (Urology)   Chief Complaint:  Chief Complaint  Patient presents with   Medical Management of Chronic Issues    4 week follow up      HPI: Jacob Fowler is a 71 y.o. male presenting on 03/01/2022 for Medical Management of Chronic Issues (4 week follow up /)  Patient is accompanied  by his wife who he is okay with being present.   Diabetes: Patient presents for follow up of diabetes. Current symptoms include: hyperglycemia. Known diabetic complications: none. Medication compliance: yes - he is tolerating Ozempic without side effects.  Metformin and pioglitazone discontinued in May 2023 due to acute kidney failure.  Current diet: in general, a  "healthy" diet  . Current exercise:  unable due to MS . Home blood sugar records: BGs are running consistent with Hgb A1C; fasting has come down to the 200s from the 400s. Is he  on ACE inhibitor or angiotensin II receptor blocker?  No, lisinopril is currently being held by nephrology.  Is he on a statin?  Not presently, he was previously but stopped for unknown reasons.  Denies any side effects to his medication, but felt like he was told to stop taking it when he went into acute kidney failure.  Hypertension: He does check his blood pressure at home and reports it is normally 712W systolic.  Hyperlipidemia/Aortic atherosclerosis/Coronary atherosclerosis: Previously taking Rosuvastatin and aspirin.    Hypothyroidism: taking levothyroxine; last dose adjustment in July 2022. Last labs on 11/30/2021 WNL.    GERD: taking omeprazole.   Vitamin D deficiency: taking an OTC vitamin D3 supplement.  Last vitamin D level was low in May 2023.  Vitamin B12 deficiency: getting monthly injections.  Last vitamin B-12 level was normal in May 2023.   Patient is following with neurology for MS and urology for his elevated PSA.  New complaints: None   Social history:  Relevant past medical, surgical, family and social history reviewed and updated as indicated. Interim medical history since our last visit reviewed.  Allergies and medications reviewed and updated.  DATA REVIEWED: CHART IN EPIC  ROS: Negative unless specifically indicated above in HPI.    Current Outpatient Medications:    aspirin 81 MG EC tablet, Take 81 mg by mouth daily., Disp: , Rfl:    cyanocobalamin (,VITAMIN B-12,) 1000 MCG/ML injection, INJECT 1ML INTO THE SKIN ONCE A MONTH, Disp: 3 mL, Rfl: 0   finasteride (PROSCAR) 5 MG tablet, Take 1 tablet (5 mg total) by mouth daily., Disp: 90 tablet, Rfl: 3   glucose blood test strip, Use as instructed, Disp: 100 each, Rfl: 12   isosorbide mononitrate (IMDUR) 60 MG 24 hr tablet, TAKE 1/2  TABLET (30MG TOTAL) BY MOUTH DAILY, Disp: 45 tablet, Rfl: 1   levothyroxine (SYNTHROID) 75 MCG tablet, TAKE ONE TABLET (75MCG TOTAL) BY MOUTH DAILY BEFORE BREAKFAST, Disp: 90 tablet, Rfl: 3   omeprazole (PRILOSEC) 20 MG capsule, TAKE ONE CAPSULE (20MG TOTAL) BY MOUTH DAILY, Disp: 90 capsule, Rfl: 1   OZEMPIC, 1 MG/DOSE, 4 MG/3ML SOPN, INJECT 1 MG AS DIRECTED ONCE A WEEK, Disp: 9 mL, Rfl: 0   silodosin (RAPAFLO) 8 MG CAPS capsule, Take 1 capsule (8 mg total) by mouth at bedtime., Disp: 30 capsule, Rfl: 11   sodium bicarbonate 650 MG tablet, Take 650 mg by mouth 4 (four) times daily., Disp: , Rfl:    No Known Allergies Past Medical History:  Diagnosis Date   Acquired leg length discrepancy    Bladder stone    BPH associated with nocturia    CAD (coronary artery disease) CARDIOLOGIST- DR HOCHREIN--  LAST VISIT 09-27-2011 IN EPIC   Non obstructive Cath 2010-  HX CORONARY SPASM 2004   Cataract    bilateral    GERD (gastroesophageal reflux disease)    H/O hiatal hernia    Heart murmur  Heartburn    High cholesterol    History of nephrolithiasis 2009   Hx of adenomatous colonic polyps    Hyperlipidemia    Hypertension    Movement disorder    Multiple sclerosis ([place]) DX  10/10---  NEUROLOGIST  DR Delfino Lovett [place] ([place])   RIGHT SIDE AFFECTED MORE W/ WEAKNESS- - USES CANE   Nephrolithiasis 08/30/2020   Neuromuscular disorder (HCC)    MS   NIDDM (non-insulin dependent diabetes mellitus)    Peyronie's disease    RBBB    Renal stone RIGHT   Rosacea    Vision abnormalities     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 / NORMAL LVF   CYSTOSCOPY W/ RETROGRADES  10/30/2011   Procedure: CYSTOSCOPY WITH RETROGRADE PYELOGRAM;  Surgeon: Claybon Jabs, MD;  Location: [facility];  Service: Urology;  Laterality: Right;   CYSTOSCOPY WITH LITHOLAPAXY  10/30/2011    Procedure: CYSTOSCOPY WITH LITHOLAPAXY;  Surgeon: Claybon Jabs, MD;  [facility];  Service: Urology;  Laterality: N/A;   CYSTOSCOPY WITH RETROGRADE PYELOGRAM, URETEROSCOPY AND STENT PLACEMENT Left 11/25/2020   Procedure: CYSTOSCOPY WITH LEFT RETROGRADE PYELOGRAM, LEFT URETEROSCOPY WITH LASER AND LEFT URETERAL STENT PLACEMENT;  Surgeon: Cleon Gustin, MD;  Location: [facility];  Service: Urology;  Laterality: Left;   EXTRACORPOREAL SHOCK WAVE LITHOTRIPSY  09-05-2010   RIGHT   EYE SURGERY Right 08/30/2018   HOLMIUM LASER APPLICATION Left 8/67/5449   Procedure: HOLMIUM LASER APPLICATION;  Surgeon: Cleon Gustin, MD;  Location: [facility];  Service: Urology;  Laterality: Left;   NESBIT PROCEDURE  10-17-2004   CORRECTION OF PENILE ANGULATION (PEYRONIES DISEASE)    Social History   Socioeconomic History   Marital status: Married    Spouse name: Hassan Rowan   Number of children: 2   Years of education: 13   Highest education level: Some college, no degree  Occupational History   Occupation: retired    Comment: self -employed farmer  Tobacco Use   Smoking status: Former    Packs/day: 1.00    Years: 7.00    Total pack years: 7.00    Types: Cigarettes    Quit date: 09/26/1973    Years since quitting: 48.4   Smokeless tobacco: Current    Types: Chew   Tobacco comments:    Chewing tobacco  Vaping Use   Vaping Use: Never used  Substance and Sexual Activity   Alcohol use: No    Alcohol/week: 0.0 standard drinks of alcohol   Drug use: No   Sexual activity: Not on file  Other Topics Concern   Not on file  Social History Narrative   Lives with wife, has MS, mobility issues.   Social Determinants of Health   Financial Resource Strain: Low Risk  (11/18/2020)   Overall Financial Resource Strain (CARDIA)    Difficulty of Paying Living Expenses: Not hard at all  Food Insecurity: No Food Insecurity (11/18/2020)   Hunger Vital Sign    Worried About Running Out of Food  in the Last Year: Never true    Ran Out of Food in the Last Year: Never true  Transportation Needs: No Transportation Needs (11/18/2020)   PRAPARE - Hydrologist (Medical): No    Lack of Transportation (Non-Medical): No  Physical Activity: Insufficiently Active (11/18/2020)   Exercise Vital Sign  Days of Exercise per Week: 4 days    Minutes of Exercise per Session: 20 min  Stress: No Stress Concern Present (11/18/2020)   [place]    Feeling of Stress : Not at all  Social Connections: Moderately Integrated (11/18/2020)   Social Connection and Isolation Panel [NHANES]    Frequency of Communication with Friends and Family: More than three times a week    Frequency of Social Gatherings with Friends and Family: More than three times a week    Attends Religious Services: More than 4 times per year    Active Member of [employer] or Organizations: No    Attends Archivist Meetings: Never    Marital Status: Married  Human resources officer Violence: Not At Risk (11/18/2020)   Humiliation, Afraid, Rape, and Kick questionnaire    Fear of Current or Ex-Partner: No    Emotionally Abused: No    Physically Abused: No    Sexually Abused: No        Objective:    BP 115/72   Pulse 91   Temp 98.1 F (36.7 C) (Temporal)   SpO2 94%   Wt Readings from Last 3 Encounters:  01/07/22 156 lb (70.8 kg)  11/21/21 154 lb (69.9 kg)  09/14/21 154 lb (69.9 kg)    Physical Exam Vitals reviewed.  Constitutional:      General: He is not in acute distress.    Appearance: Normal appearance. He is normal weight. He is not ill-appearing, toxic-appearing or diaphoretic.  HENT:     Head: Normocephalic and atraumatic.  Eyes:     General: No scleral icterus.       Right eye: No discharge.        Left eye: No discharge.     Conjunctiva/sclera: Conjunctivae normal.  Cardiovascular:     Rate and Rhythm: Normal rate and  regular rhythm.     Heart sounds: Normal heart sounds. No murmur heard.    No friction rub. No gallop.  Pulmonary:     Effort: Pulmonary effort is normal. No respiratory distress.     Breath sounds: Normal breath sounds. No stridor. No wheezing, rhonchi or rales.  Musculoskeletal:        General: Normal range of motion.     Cervical back: Normal range of motion.     Right foot: Foot drop present.  Skin:    General: Skin is warm and dry.  Neurological:     Mental Status: He is alert and oriented to person, place, and time. Mental status is at baseline.     Gait: Gait abnormal (ambulates with rolling walker).  Psychiatric:        Mood and Affect: Mood normal.        Behavior: Behavior normal.        Thought Content: Thought content normal.        Judgment: Judgment normal.    Lab Results  Component Value Date   TSH 4.240 11/30/2021   Lab Results  Component Value Date   WBC 6.0 01/07/2022   HGB 10.8 (L) 01/07/2022   HCT 32.8 (L) 01/07/2022   MCV 85.9 01/07/2022   PLT 253 01/07/2022   Lab Results  Component Value Date   NA 132 (L) 01/07/2022   K 4.6 01/07/2022   CO2 25 01/07/2022   GLUCOSE 312 (H) 01/07/2022   BUN 39 (H) 01/07/2022   CREATININE 2.65 (H) 01/07/2022   BILITOT 0.5 01/07/2022  ALKPHOS 97 01/07/2022   AST 17 01/07/2022   ALT 22 01/07/2022   PROT 7.8 01/07/2022   ALBUMIN 3.1 (L) 01/07/2022   CALCIUM 9.4 01/07/2022   ANIONGAP 7 01/07/2022   EGFR 25 (L) 12/05/2021   Lab Results  Component Value Date   CHOL 108 11/30/2021   Lab Results  Component Value Date   HDL 25 (L) 11/30/2021   Lab Results  Component Value Date   LDLCALC 38 11/30/2021   Lab Results  Component Value Date   TRIG 297 (H) 11/30/2021   Lab Results  Component Value Date   CHOLHDL 4.3 11/30/2021   Lab Results  Component Value Date   HGBA1C 9.0 (H) 11/30/2021

## 2022-03-02 ENCOUNTER — Telehealth: Payer: Self-pay | Admitting: Family Medicine

## 2022-03-02 LAB — CMP14+EGFR
ALT: 8 IU/L (ref 0–44)
AST: 9 IU/L (ref 0–40)
Albumin/Globulin Ratio: 1.4 (ref 1.2–2.2)
Albumin: 4 g/dL (ref 3.8–4.8)
Alkaline Phosphatase: 120 IU/L (ref 44–121)
BUN/Creatinine Ratio: 12 (ref 10–24)
BUN: 29 mg/dL — ABNORMAL HIGH (ref 8–27)
Bilirubin Total: <0.2 mg/dL (ref 0.0–1.2)
CO2: 22 mmol/L (ref 20–29)
Calcium: 9.6 mg/dL (ref 8.6–10.2)
Chloride: 92 mmol/L — ABNORMAL LOW (ref 96–106)
Creatinine, Ser: 2.42 mg/dL — ABNORMAL HIGH (ref 0.76–1.27)
Globulin, Total: 2.9 g/dL (ref 1.5–4.5)
Glucose: 403 mg/dL (ref 70–99)
Potassium: 4.7 mmol/L (ref 3.5–5.2)
Sodium: 131 mmol/L — ABNORMAL LOW (ref 134–144)
Total Protein: 6.9 g/dL (ref 6.0–8.5)
eGFR: 28 mL/min/{1.73_m2} — ABNORMAL LOW (ref >59)

## 2022-03-02 LAB — CBC WITH DIFFERENTIAL/PLATELET
Basophils Absolute: 0 10*3/uL (ref 0.0–0.2)
Basos: 0 %
EOS (ABSOLUTE): 0.2 10*3/uL (ref 0.0–0.4)
Eos: 2 %
Hematocrit: 37.4 % — ABNORMAL LOW (ref 37.5–51.0)
Hemoglobin: 11.8 g/dL — ABNORMAL LOW (ref 13.0–17.7)
Immature Grans (Abs): 0 10*3/uL (ref 0.0–0.1)
Immature Granulocytes: 0 %
Lymphocytes Absolute: 1.7 10*3/uL (ref 0.7–3.1)
Lymphs: 16 %
MCH: 27.3 pg (ref 26.6–33.0)
MCHC: 31.6 g/dL (ref 31.5–35.7)
MCV: 87 fL (ref 79–97)
Monocytes Absolute: 0.8 10*3/uL (ref 0.1–0.9)
Monocytes: 7 %
Neutrophils Absolute: 7.9 10*3/uL — ABNORMAL HIGH (ref 1.4–7.0)
Neutrophils: 75 %
Platelets: 311 10*3/uL (ref 150–450)
RBC: 4.32 x10E6/uL (ref 4.14–5.80)
RDW: 13.8 % (ref 11.6–15.4)
WBC: 10.6 10*3/uL (ref 3.4–10.8)

## 2022-03-02 LAB — LIPID PANEL
Chol/HDL Ratio: 8 ratio — ABNORMAL HIGH (ref 0.0–5.0)
Cholesterol, Total: 183 mg/dL (ref 100–199)
HDL: 23 mg/dL — ABNORMAL LOW (ref >39)
LDL Chol Calc (NIH): 114 mg/dL — ABNORMAL HIGH (ref 0–99)
Triglycerides: 264 mg/dL — ABNORMAL HIGH (ref 0–149)
VLDL Cholesterol Cal: 46 mg/dL — ABNORMAL HIGH (ref 5–40)

## 2022-03-02 LAB — VITAMIN D 25 HYDROXY (VIT D DEFICIENCY, FRACTURES): Vit D, 25-Hydroxy: 38 ng/mL (ref 30.0–100.0)

## 2022-03-02 NOTE — Telephone Encounter (Signed)
[employer] called with critical lab result.  Glucose 403

## 2022-03-02 NOTE — Telephone Encounter (Signed)
Already addressed in lab result.

## 2022-03-06 DIAGNOSIS — G35 Multiple sclerosis: Secondary | ICD-10-CM | POA: Diagnosis not present

## 2022-03-06 DIAGNOSIS — N200 Calculus of kidney: Secondary | ICD-10-CM | POA: Diagnosis not present

## 2022-03-06 DIAGNOSIS — N189 Chronic kidney disease, unspecified: Secondary | ICD-10-CM | POA: Diagnosis not present

## 2022-03-06 DIAGNOSIS — E1122 Type 2 diabetes mellitus with diabetic chronic kidney disease: Secondary | ICD-10-CM | POA: Diagnosis not present

## 2022-03-06 DIAGNOSIS — R82991 Hypocitraturia: Secondary | ICD-10-CM | POA: Diagnosis not present

## 2022-03-06 DIAGNOSIS — R809 Proteinuria, unspecified: Secondary | ICD-10-CM | POA: Diagnosis not present

## 2022-03-06 DIAGNOSIS — E1129 Type 2 diabetes mellitus with other diabetic kidney complication: Secondary | ICD-10-CM | POA: Diagnosis not present

## 2022-03-06 DIAGNOSIS — D638 Anemia in other chronic diseases classified elsewhere: Secondary | ICD-10-CM | POA: Diagnosis not present

## 2022-03-06 DIAGNOSIS — N170 Acute kidney failure with tubular necrosis: Secondary | ICD-10-CM | POA: Diagnosis not present

## 2022-03-06 DIAGNOSIS — N1339 Other hydronephrosis: Secondary | ICD-10-CM | POA: Diagnosis not present

## 2022-03-08 DIAGNOSIS — N200 Calculus of kidney: Secondary | ICD-10-CM | POA: Diagnosis not present

## 2022-03-08 DIAGNOSIS — R809 Proteinuria, unspecified: Secondary | ICD-10-CM | POA: Diagnosis not present

## 2022-03-08 DIAGNOSIS — R82991 Hypocitraturia: Secondary | ICD-10-CM | POA: Diagnosis not present

## 2022-03-08 DIAGNOSIS — D638 Anemia in other chronic diseases classified elsewhere: Secondary | ICD-10-CM | POA: Diagnosis not present

## 2022-03-08 DIAGNOSIS — E211 Secondary hyperparathyroidism, not elsewhere classified: Secondary | ICD-10-CM | POA: Diagnosis not present

## 2022-03-08 DIAGNOSIS — N170 Acute kidney failure with tubular necrosis: Secondary | ICD-10-CM | POA: Diagnosis not present

## 2022-03-08 DIAGNOSIS — N189 Chronic kidney disease, unspecified: Secondary | ICD-10-CM | POA: Diagnosis not present

## 2022-03-08 DIAGNOSIS — E1129 Type 2 diabetes mellitus with other diabetic kidney complication: Secondary | ICD-10-CM | POA: Diagnosis not present

## 2022-03-08 DIAGNOSIS — E1122 Type 2 diabetes mellitus with diabetic chronic kidney disease: Secondary | ICD-10-CM | POA: Diagnosis not present

## 2022-03-08 DIAGNOSIS — N1339 Other hydronephrosis: Secondary | ICD-10-CM | POA: Diagnosis not present

## 2022-03-15 ENCOUNTER — Ambulatory Visit: Payer: PPO | Admitting: Family Medicine

## 2022-03-15 ENCOUNTER — Other Ambulatory Visit: Payer: Self-pay | Admitting: *Deleted

## 2022-03-15 ENCOUNTER — Encounter: Payer: Self-pay | Admitting: *Deleted

## 2022-03-15 NOTE — Patient Outreach (Signed)
  Care Coordination   Initial Visit Note   03/15/2022 Name: Jameil Whitmoyer MRN: [id number] DOB: 01/29/1951  Hassani Sliney Byas is a 71 y.o. year old male who sees Chevis Pretty, [place], for primary care. I spoke with  Patrecia Pace Gracey by phone today and his daughter Mitzi.Marland Kitchen  What matters to the patients health and wellness today?  Improving glucose control.    Goals Addressed               This Visit's Progress     Patient Stated     Work on getting my glucose levels down. (pt-stated)        Care Coordination Interventions: Reinforced checking glucose as MD has requested (daily) and record. Assessed current diet practices and knowledge of carb counting. Requested that pt with family assistance record dietary intake for several days and try to figure out the carbs in each meal and snack (previously has been given information). Call provider if glucose reading is >400 for instructions. Attend f/u appt with primary care in October.          SDOH assessments and interventions completed:  Yes  SDOH Interventions Today    Flowsheet Row Most Recent Value  SDOH Interventions   Food Insecurity Interventions Intervention Not Indicated  Transportation Interventions Intervention Not Indicated        Care Coordination Interventions Activated:  Yes  Care Coordination Interventions:  Yes, provided   Follow up plan: Follow up call scheduled for 3 weeks.    Encounter Outcome:  Pt. Visit Completed   We completed a goals of care conversation today. Pt has HCPOA and Living Will but says it is old and may need updating. Requested that daughter make a copy and give to primary care so it can be added to his medical record.  Eulah Pont. Myrtie Neither, MSN, [facility] Gerontological Nurse Practitioner [facility] Care Management [phone]

## 2022-03-22 ENCOUNTER — Ambulatory Visit (INDEPENDENT_AMBULATORY_CARE_PROVIDER_SITE_OTHER): Payer: PPO | Admitting: Urology

## 2022-03-22 ENCOUNTER — Encounter: Payer: Self-pay | Admitting: Urology

## 2022-03-22 VITALS — BP 112/78 | HR 89

## 2022-03-22 DIAGNOSIS — R319 Hematuria, unspecified: Secondary | ICD-10-CM | POA: Diagnosis not present

## 2022-03-22 DIAGNOSIS — N401 Enlarged prostate with lower urinary tract symptoms: Secondary | ICD-10-CM | POA: Diagnosis not present

## 2022-03-22 DIAGNOSIS — R351 Nocturia: Secondary | ICD-10-CM | POA: Diagnosis not present

## 2022-03-22 DIAGNOSIS — N200 Calculus of kidney: Secondary | ICD-10-CM

## 2022-03-22 DIAGNOSIS — N138 Other obstructive and reflux uropathy: Secondary | ICD-10-CM

## 2022-03-22 DIAGNOSIS — N390 Urinary tract infection, site not specified: Secondary | ICD-10-CM | POA: Diagnosis not present

## 2022-03-22 LAB — MICROSCOPIC EXAMINATION
Epithelial Cells (non renal): NONE SEEN /hpf (ref 0–10)
Renal Epithel, UA: NONE SEEN /hpf
WBC, UA: >30 /hpf — AB (ref 0–5)

## 2022-03-22 LAB — URINALYSIS, ROUTINE W REFLEX MICROSCOPIC
Bilirubin, UA: NEGATIVE
Ketones, UA: NEGATIVE
Nitrite, UA: NEGATIVE
Specific Gravity, UA: 1.01 (ref 1.005–1.030)
Urobilinogen, Ur: 0.2 mg/dL (ref 0.2–1.0)
pH, UA: 7.5 (ref 5.0–7.5)

## 2022-03-22 LAB — BLADDER SCAN AMB NON-IMAGING: Scan Result: 269

## 2022-03-22 MED ORDER — SILODOSIN 8 MG PO CAPS
8.0000 mg | ORAL_CAPSULE | Freq: Every day | ORAL | 11 refills | Status: DC
Start: 2022-03-22 — End: 2022-07-27

## 2022-03-22 MED ORDER — FINASTERIDE 5 MG PO TABS: 5.0000 mg | ORAL_TABLET | Freq: Every day | ORAL | 3 refills | Status: DC

## 2022-03-22 NOTE — Patient Instructions (Signed)

## 2022-03-22 NOTE — Progress Notes (Signed)
03/22/2022 4:00 PM   Jacob Fowler [birth year] [id number]  Referring provider: [name], [place],  [place] [address]  Followup BPH and nephrolithiasis   HPI: Mr Jacob Fowler is a 71yo here for followup for BPH and nephrolithiasis. Renal US 01/2022 shows right hydronephrosis. No flank pain.  PVR 269cc. IPSS 0 QOL 0 on finasteride and rapaflo. Dysuria resolved. No hematuria   PMH: Past Medical History:  Diagnosis Date   Acquired leg length discrepancy    Bladder stone    BPH associated with nocturia    CAD (coronary artery disease) CARDIOLOGIST- DR HOCHREIN--  LAST VISIT 09-27-2011 IN EPIC   Non obstructive Cath 2010-  HX CORONARY SPASM 2004   Cataract    bilateral    GERD (gastroesophageal reflux disease)    H/O hiatal hernia    Heart murmur    Heartburn    High cholesterol    History of nephrolithiasis 2009   Hx of adenomatous colonic polyps    Hyperlipidemia    Hypertension    Movement disorder    Multiple sclerosis ([place]) DX  10/10---  NEUROLOGIST  DR Delfino Lovett [facility] ([place])   RIGHT SIDE AFFECTED MORE W/ WEAKNESS- - USES CANE   Nephrolithiasis 08/30/2020   Neuromuscular disorder (HCC)    MS   NIDDM (non-insulin dependent diabetes mellitus)    Peyronie's disease    RBBB    Renal stone RIGHT   Rosacea    Vision abnormalities     Surgical History: 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 / NORMAL LVF   CYSTOSCOPY W/ RETROGRADES  10/30/2011   Procedure: CYSTOSCOPY WITH RETROGRADE PYELOGRAM;  Surgeon: Claybon Jabs, MD;  Location: [facility];  Service: Urology;  Laterality: Right;   CYSTOSCOPY WITH LITHOLAPAXY  10/30/2011   Procedure: CYSTOSCOPY WITH LITHOLAPAXY;  Surgeon: Claybon Jabs, MD;  Location: [facility];  Service: Urology;  Laterality: N/A;   CYSTOSCOPY WITH RETROGRADE  PYELOGRAM, URETEROSCOPY AND STENT PLACEMENT Left 11/25/2020   Procedure: CYSTOSCOPY WITH LEFT RETROGRADE PYELOGRAM, LEFT URETEROSCOPY WITH LASER AND LEFT URETERAL STENT PLACEMENT;  Surgeon: Cleon Gustin, MD;  Location: [facility];  Service: Urology;  Laterality: Left;   EXTRACORPOREAL SHOCK WAVE LITHOTRIPSY  09-05-2010   RIGHT   EYE SURGERY Right 08/30/2018   HOLMIUM LASER APPLICATION Left 0/62/6948   Procedure: HOLMIUM LASER APPLICATION;  Surgeon: Cleon Gustin, MD;  Location: [facility];  Service: Urology;  Laterality: Left;   NESBIT PROCEDURE  10-17-2004   CORRECTION OF PENILE ANGULATION (PEYRONIES DISEASE)    Home Medications:  Allergies as of 03/22/2022   No Known Allergies      Medication List        Accurate as of March 22, 2022  4:00 PM. If you have any questions, ask your nurse or doctor.          aspirin EC 81 MG tablet Take 81 mg by mouth daily.   cyanocobalamin 1000 MCG/ML injection Commonly known as: VITAMIN B12 INJECT 1ML INTO THE SKIN ONCE A MONTH   finasteride 5 MG tablet Commonly known as: PROSCAR Take 1 tablet (5 mg total) by mouth daily.   glucose blood test strip Use as instructed   isosorbide mononitrate 60 MG 24 hr tablet Commonly known as: IMDUR TAKE 1/2 TABLET ('30MG'$  TOTAL) BY MOUTH DAILY   levothyroxine  75 MCG tablet Commonly known as: SYNTHROID TAKE ONE TABLET (75MCG TOTAL) BY MOUTH DAILY BEFORE BREAKFAST   omeprazole 20 MG capsule Commonly known as: PRILOSEC Take 1 capsule (20 mg total) by mouth daily.   rosuvastatin 10 MG tablet Commonly known as: Crestor Take 1 tablet (10 mg total) by mouth daily.   Semaglutide (2 MG/DOSE) 8 MG/3ML Sopn Inject 2 mg as directed once a week.   silodosin 8 MG Caps capsule Commonly known as: RAPAFLO Take 1 capsule (8 mg total) by mouth at bedtime.   sodium bicarbonate 650 MG tablet Take 650 mg by mouth 4 (four) times daily.   Vitamin D 50 MCG (2000 UT) tablet Take 2,000 Units by mouth  daily.        Allergies: No Known Allergies  Family History: Family History  Problem Relation Age of Onset   Stroke Mother    Heart attack Mother    Diabetes Mother    Hypertension Mother    Renal cancer Father    Diabetes Sister    Heart attack Brother    Diabetes Son    Heart attack Maternal Grandfather    Heart attack Brother    Bladder Cancer Brother     Social History:  reports that he quit smoking about 48 years ago. His smoking use included cigarettes. He has a 7.00 pack-year smoking history. His smokeless tobacco use includes chew. He reports that he does not drink alcohol and does not use drugs.  ROS: All other review of systems were reviewed and are negative except what is noted above in HPI  Physical Exam: BP 112/78   Pulse 89   Constitutional:  Alert and oriented, No acute distress. HEENT: [place] AT, moist mucus membranes.  Trachea midline, no masses. Cardiovascular: No clubbing, cyanosis, or edema. Respiratory: Normal respiratory effort, no increased work of breathing. GI: Abdomen is soft, nontender, nondistended, no abdominal masses GU: No CVA tenderness.  Lymph: No cervical or inguinal lymphadenopathy. Skin: No rashes, bruises or suspicious lesions. Neurologic: Grossly intact, no focal deficits, moving all 4 extremities. Psychiatric: Normal mood and affect.  Laboratory Data: Lab Results  Component Value Date   WBC 10.6 03/01/2022   HGB 11.8 (L) 03/01/2022   HCT 37.4 (L) 03/01/2022   MCV 87 03/01/2022   PLT 311 03/01/2022    Lab Results  Component Value Date   CREATININE 2.42 (H) 03/01/2022    Lab Results  Component Value Date   PSA 5.5 (H) 01/28/2014   PSA 3.37 01/06/2013   PSA 3.08 10/14/2012    No results found for: "TESTOSTERONE"  Lab Results  Component Value Date   HGBA1C 9.4 (H) 03/01/2022    Urinalysis    Component Value Date/Time   COLORURINE YELLOW 01/07/2022 0952   APPEARANCEUR TURBID (A) 01/07/2022 0952   APPEARANCEUR  Cloudy (A) 12/21/2021 1514   LABSPEC 1.009 01/07/2022 0952   PHURINE 7.0 01/07/2022 0952   GLUCOSEU >=500 (A) 01/07/2022 0952   HGBUR SMALL (A) 01/07/2022 0952   BILIRUBINUR NEGATIVE 01/07/2022 0952   BILIRUBINUR Negative 12/21/2021 [place] 01/07/2022 0952   PROTEINUR 100 (A) 01/07/2022 0952   UROBILINOGEN negative 12/18/2014 1601   UROBILINOGEN 1.0 04/06/2010 0509   NITRITE NEGATIVE 01/07/2022 0952   LEUKOCYTESUR LARGE (A) 01/07/2022 0952    Lab Results  Component Value Date   LABMICR See below: 12/21/2021   WBCUA >30 (A) 12/21/2021   RBCUA >30 (A) 08/07/2018   LABEPIT 0-10 12/21/2021   MUCUS Present  12/21/2021   BACTERIA NONE SEEN 01/07/2022    Pertinent Imaging: Renal US 02/01/2022: Images reviewed and discussed with the patient  Results for orders placed during the hospital encounter of 10/30/11  KUB day of procedure  Narrative *RADIOLOGY REPORT*  Clinical Data: Right ureteral stone  ABDOMEN - 1 VIEW  Comparison: [facility] CT urogram dated 10/17/2011  Findings: Prior distal right ureteral calculus is not definitely visualized on the current radiograph.  Known nonobstructing right lower pole calculi are also not radiographically apparent.  Calcified phleboliths in the pelvis.  Linear calcification overlying the right sacrum corresponds to a vascular calcification.  Suspected radiopaque ingested tablets in the splenic flexure, sigmoid colon (overlying superior bladder), and cecum.  Degenerative changes of the visualized thoracolumbar spine.  IMPRESSION: Prior distal right ureteral calculus is not definitely visualized on the current radiograph.  Known nonobstructing right lower pole calculi are also not radiographically apparent.  Original Report Authenticated By: Julian Hy, M.D.  No results found for this or any previous visit.  No results found for this or any previous visit.  No results found for this or any previous  visit.  Results for orders placed during the hospital encounter of 02/01/22  US RENAL  Narrative CLINICAL DATA:  Kidney stone.  EXAM: RENAL / URINARY TRACT ULTRASOUND COMPLETE  COMPARISON:  Renal ultrasound last month 12/16/2021, CT 01/13/2021  FINDINGS: Right Kidney:  Renal measurements: 9.9 x 5.4 x 5.9 cm = volume: 163 mL. Moderate hydronephrosis which was not seen on prior ultrasound. Possible 8 mm nonobstructing stone in the lower kidney, also not seen on prior ultrasound. No evidence of focal renal lesion.  Left Kidney:  Renal measurements: 9.8 x 4.9 x 5.7 cm = volume: 142 mL. Similar degree of mild to moderate hydronephrosis. No visualized renal stone or focal lesion.  Bladder:  Appears normal for degree of bladder distention. Both ureteral jets are demonstrated.  Other:  None.  IMPRESSION: 1. Moderate right hydronephrosis, new from ultrasound last month. Possible nonobstructing right renal calculus. 2. Similar degree of mild to moderate left hydronephrosis.   Electronically Signed By: Keith Rake M.D. On: 02/02/2022 00:18  No results found for this or any previous visit.  No results found for this or any previous visit.  Results for orders placed during the hospital encounter of 01/13/21  CT RENAL STONE STUDY  Narrative CLINICAL DATA:  Right-sided back and flank pain for several months. Urinoma.  EXAM: CT ABDOMEN AND PELVIS WITHOUT CONTRAST  TECHNIQUE: Multidetector CT imaging of the abdomen and pelvis was performed following the standard protocol without IV contrast.  COMPARISON:  12/03/2020  FINDINGS: Lower chest: No acute findings.  Hepatobiliary: No mass visualized on this unenhanced exam. Gallbladder is unremarkable. No evidence of biliary ductal dilatation.  Pancreas: No mass or inflammatory process visualized on this unenhanced exam.  Spleen:  Within normal limits in size.  Adrenals/Urinary tract: A few tiny less than  5 mm renal calculi are again seen bilaterally. Left ureteral stent remains in appropriate position, and there is no evidence of hydronephrosis. Previously seen left perinephric and retroperitoneal fluid collection has nearly completely resolved since prior study. Mild diffuse bladder wall thickening is again seen, likely due to chronic bladder outlet obstruction given enlarged prostate.  Stomach/Bowel: Tiny hiatal hernia noted. No evidence of obstruction, inflammatory process, or abnormal fluid collections. Normal appendix visualized.  Vascular/Lymphatic: No pathologically enlarged lymph nodes identified. No evidence of abdominal aortic aneurysm. Aortic atherosclerotic calcification noted.  Reproductive:  Mildly enlarged  prostate again noted.  Other:  None.  Musculoskeletal:  No suspicious bone lesions identified.  IMPRESSION: Left ureteral stent in appropriate position. No evidence of hydronephrosis or other acute findings.  Near complete resolution of left perinephric and retroperitoneal fluid collection.  Tiny nonobstructing bilateral renal calculi.  Stable mildly enlarged prostate and findings of chronic bladder outlet obstruction.  Tiny hiatal hernia.  Aortic Atherosclerosis (ICD10-I70.0).   Electronically Signed By: Marlaine Hind M.D. On: 01/14/2021 17:17   Assessment & Plan:    1. Nocturia -Continue rapaflo '8mg'$  and fiansteride - BLADDER SCAN AMB NON-IMAGING - Urinalysis, Routine w reflex microscopic  2. Benign prostatic hyperplasia with urinary obstruction -continue finasteride and rapaflo '8mg'$    3. Nephrolithiasis -CT stone study, will call with results   No follow-ups on file.  Nicolette Bang, MD  [facility] [place]

## 2022-03-22 NOTE — Progress Notes (Signed)
post void residual =237m

## 2022-03-24 ENCOUNTER — Telehealth: Payer: Self-pay

## 2022-03-24 DIAGNOSIS — N390 Urinary tract infection, site not specified: Secondary | ICD-10-CM

## 2022-03-24 LAB — URINE CULTURE

## 2022-03-24 MED ORDER — NITROFURANTOIN MONOHYD MACRO 100 MG PO CAPS: 100.0000 mg | ORAL_CAPSULE | Freq: Two times a day (BID) | ORAL | 0 refills | Status: DC

## 2022-03-24 NOTE — Telephone Encounter (Signed)
Wife called and made aware of abt sent to pharmacy for patient's UTI

## 2022-04-03 ENCOUNTER — Other Ambulatory Visit: Payer: Self-pay | Admitting: Family Medicine

## 2022-04-03 DIAGNOSIS — E039 Hypothyroidism, unspecified: Secondary | ICD-10-CM

## 2022-04-05 ENCOUNTER — Telehealth: Payer: Self-pay | Admitting: *Deleted

## 2022-04-05 ENCOUNTER — Encounter: Payer: Self-pay | Admitting: *Deleted

## 2022-04-05 NOTE — Patient Outreach (Signed)
  Care Coordination   04/05/2022 Name: Jacob Fowler MRN: [id number] DOB: 06-27-1951   Care Coordination Outreach Attempts:  An unsuccessful telephone outreach was attempted today to offer the patient information about available care coordination services as a benefit of their health plan.   Follow Up Plan:  Additional outreach attempts will be made to offer the patient care coordination information and services.   Encounter Outcome:  No Answer  Care Coordination Interventions Activated:  No   Care Coordination Interventions:  No, not indicated    SIG Kalanie Fewell C. Burgess Estelle, MSN, [facility] Gerontological Nurse Practitioner [facility] Care Management [phone]

## 2022-04-17 ENCOUNTER — Ambulatory Visit (INDEPENDENT_AMBULATORY_CARE_PROVIDER_SITE_OTHER): Payer: PPO | Admitting: Nurse Practitioner

## 2022-04-17 ENCOUNTER — Encounter: Payer: Self-pay | Admitting: Nurse Practitioner

## 2022-04-17 VITALS — BP 140/81 | HR 109 | Temp 98.6°F | Resp 20

## 2022-04-17 DIAGNOSIS — Z23 Encounter for immunization: Secondary | ICD-10-CM | POA: Diagnosis not present

## 2022-04-17 DIAGNOSIS — E1165 Type 2 diabetes mellitus with hyperglycemia: Secondary | ICD-10-CM | POA: Diagnosis not present

## 2022-04-17 DIAGNOSIS — L03012 Cellulitis of left finger: Secondary | ICD-10-CM

## 2022-04-17 LAB — BAYER DCA HB A1C WAIVED: HB A1C (BAYER DCA - WAIVED): 10.8 % — ABNORMAL HIGH (ref 4.8–5.6)

## 2022-04-17 MED ORDER — SULFAMETHOXAZOLE-TRIMETHOPRIM 800-160 MG PO TABS: 1.0000 | ORAL_TABLET | Freq: Two times a day (BID) | ORAL | 0 refills | Status: DC

## 2022-04-17 NOTE — Patient Instructions (Signed)
Paronychia Paronychia is an infection of the skin that surrounds a nail. It usually affects the skin around a fingernail, but it may also occur near a toenail. It often causes pain and swelling around the nail. In some cases, a collection of pus (abscess) can form near or under the nail.  This condition may develop suddenly, or it may develop gradually over a longer period. In most cases, paronychia is not serious, and it will clear up with treatment. What are the causes? This condition may be caused by bacteria or a fungus, such as yeast. The bacteria or fungus can enter the body through an opening in the skin, such as a cut or a hangnail, and cause an infection in your fingernail or toenail. Other causes may include: Recurrent injury to the fingernail or toenail area. Irritation of the base and sides of the nail (cuticle). Injury and irritation can result in inflammation, swelling, and thickened skin around the nail. What increases the risk? This condition is more likely to develop in people who: Get their hands wet often, such as those who work as dishwashers, bartenders, or housekeepers. Bite their fingernails or cuticles. Have underlying skin conditions. Have hangnails or injured fingertips. Are exposed to irritants like detergents and other chemicals. Have diabetes. What are the signs or symptoms? Symptoms of this condition include: Redness and swelling of the skin near the nail. Tenderness around the nail when you touch the area. Pus-filled bumps under the cuticle. Fluid or pus under the nail. Throbbing pain in the area. How is this diagnosed? This condition is diagnosed with a physical exam. In some cases, a sample of pus may be tested to determine what type of bacteria or fungus is causing the condition. How is this treated? Treatment depends on the cause and severity of your condition. If your condition is mild, it may clear up on its own in a few days or after soaking in warm  water. If needed, treatment may include: Antibiotic medicine, if your infection is caused by bacteria. Antifungal medicine, if your infection is caused by a fungus. A procedure to drain pus from an abscess. Anti-inflammatory medicine (corticosteroids). Removal of part of an ingrown toenail. A bandage (dressing) may be placed over the affected area if an abscess or part of a nail has been removed. Follow these instructions at home: Wound care Keep the affected area clean. Soak the affected area in warm water if told to do so by your health care provider. You may be told to do this for 20 minutes, 2-3 times a day. Keep the area dry when you are not soaking it. Do not try to drain an abscess yourself. Follow instructions from your health care provider about how to take care of the affected area. Make sure you: Wash your hands with soap and water for at least 20 seconds before and after you change your dressing. If soap and water are not available, use hand sanitizer. Change your dressing as told by your health care provider. If you had an abscess drained, check the area every day for signs of infection. Check for: Redness, swelling, or pain. Fluid or blood. Warmth. Pus or a bad smell. Medicines  Take over-the-counter and prescription medicines only as told by your health care provider. If you were prescribed an antibiotic medicine, take it as told by your health care provider. Do not stop taking the antibiotic even if you start to feel better. General instructions Avoid contact with any skin irritants or allergens.   Do not pick at the affected area. Keep all follow-up visits as told. This is important. Prevention To prevent this condition from happening again: Wear rubber gloves when washing dishes or doing other tasks that require your hands to get wet. Wear gloves if your hands might come in contact with cleaners or other chemicals. Avoid injuring your nails or fingertips. Do not bite  your nails or tear hangnails. Do not cut your nails very short. Do not cut your cuticles. Use clean nail clippers or scissors when trimming nails. Contact a health care provider if: Your symptoms get worse or do not improve with treatment. You have continued or increased fluid, blood, or pus coming from the affected area. Your affected finger, toe, or joint becomes swollen or difficult to move. You have a fever or chills. There is redness spreading away from the affected area. Summary Paronychia is an infection of the skin that surrounds a nail. It often causes pain and swelling around the nail. In some cases, a collection of pus (abscess) can form near or under the nail. This condition may be caused by bacteria or a fungus. These germs can enter the body through an opening in the skin, such as a cut or a hangnail. If your condition is mild, it may clear up on its own in a few days. If needed, treatment may include medicine or a procedure to drain pus from an abscess. To prevent this condition from happening again, wear gloves if doing tasks that require your hands to get wet or to come in contact with chemicals. Also avoid injuring your nails or fingertips. This information is not intended to replace advice given to you by your health care provider. Make sure you discuss any questions you have with your health care provider. Document Revised: 10/04/2020 Document Reviewed: 10/04/2020 Elsevier Patient Education  2023 Elsevier Inc.  

## 2022-04-17 NOTE — Progress Notes (Signed)
Subjective:    [name], male    DOB: Jan 12, 1951, 71 y.o.   MRN: [id number]   Chief Complaint: diabetes   HPI:  Jacob Fowler is a 71 y.o. who identifies as a male who was assigned male at birth.   Social history: Lives with: wife Work history: use to own dairy farm   Comes in today for follow up of the following chronic medical issues:  1. Uncontrolled type 2 diabetes mellitus with hyperglycemia, without long-term current use of insulin ([place]) Patient was seen on 03/01/22 with uncontrolled diabetes. His ozempic was increased to '2mg'$  weekly.they gave him the wrong dose of ozempic at pharmacy ( 0.'5mg'$  ) they are [facility] on [facility] family [facility]arm[facility] His fasting blood sugars have been running 200-250. No low blood sugars Lab Results  Component Value Date   HGBA1C 9.4 (H) 03/01/2022     New complaints: None today  No Known Allergies Outpatient Encounter Medications as of 04/17/2022  Medication Sig   aspirin 81 MG EC tablet Take 81 mg by mouth daily.   Cholecalciferol (VITAMIN D) 50 MCG (2000 UT) tablet Take 2,000 Units by mouth daily.   cyanocobalamin (,VITAMIN B-12,) 1000 MCG/ML injection INJECT 1ML INTO THE SKIN ONCE A MONTH   finasteride (PROSCAR) 5 MG tablet Take 1 tablet (5 mg total) by mouth daily.   glucose blood test strip Use as instructed   isosorbide mononitrate (IMDUR) 60 MG 24 hr tablet TAKE 1/2 TABLET ('30MG'$  TOTAL) BY MOUTH DAILY   levothyroxine (SYNTHROID) 75 MCG tablet TAKE ONE TABLET (75MCG TOTAL) BY MOUTH DAILY BEFORE BREAKFAST   nitrofurantoin, macrocrystal-monohydrate, (MACROBID) 100 MG capsule Take 1 capsule (100 mg total) by mouth 2 (two) times daily.   omeprazole (PRILOSEC) 20 MG capsule Take 1 capsule (20 mg total) by mouth daily.   rosuvastatin (CRESTOR) 10 MG tablet Take 1 tablet (10 mg total) by mouth daily.   Semaglutide, 2 MG/DOSE, 8 MG/3ML SOPN Inject 2 mg as directed once a week.   silodosin (RAPAFLO) 8 MG CAPS  capsule Take 1 capsule (8 mg total) by mouth at bedtime.   sodium bicarbonate 650 MG tablet Take 650 mg by mouth 4 (four) times daily.   [DISCONTINUED] atorvastatin (LIPITOR) 10 MG tablet Take 10 mg by mouth daily.   [DISCONTINUED] niacin-simvastatin (SIMCOR) 500-20 MG 24 hr tablet Take 1 tablet by mouth at bedtime.     No facility-administered encounter medications on file as of 04/17/2022.    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 / NORMAL LVF   CYSTOSCOPY W/ RETROGRADES  10/30/2011   Procedure: CYSTOSCOPY WITH RETROGRADE PYELOGRAM;  Surgeon: Claybon Jabs, MD;  Location: [facility];  Service: Urology;  Laterality: Right;   CYSTOSCOPY WITH LITHOLAPAXY  10/30/2011   Procedure: CYSTOSCOPY WITH LITHOLAPAXY;  Surgeon: Claybon Jabs, MD;  Location: [facility];  Service: Urology;  Laterality: N/A;   CYSTOSCOPY WITH RETROGRADE PYELOGRAM, URETEROSCOPY AND STENT PLACEMENT Left 11/25/2020   Procedure: CYSTOSCOPY WITH LEFT RETROGRADE PYELOGRAM, LEFT URETEROSCOPY WITH LASER AND LEFT URETERAL STENT PLACEMENT;  Surgeon: Cleon Gustin, MD;  Location[facility];  Service: Urology;  Laterality: Left;   EXTRACORPOREAL SHOCK WAVE LITHOTRIPSY  09-05-2010   RIGHT   EYE SURGERY Right 08/30/2018   HOLMIUM LASER APPLICATION Left 03/03/2992   Procedure: HOLMIUM LASER APPLICATION;  Surgeon: Cleon Gustin,  MD;  Location: [facility];  Service: Urology;  Laterality: Left;   NESBIT PROCEDURE  10-17-2004   CORRECTION OF PENILE ANGULATION (PEYRONIES DISEASE)    Family History  Problem Relation Age of Onset   Stroke Mother    Heart attack Mother    Diabetes Mother    Hypertension Mother    Renal cancer Father    Diabetes Sister    Heart attack Brother    Diabetes Son    Heart attack Maternal Grandfather    Heart attack Brother    Bladder Cancer Brother        Controlled substance contract: n/a     Review of Systems  Constitutional:  Negative for diaphoresis.  Eyes:  Negative for pain.  Respiratory:  Negative for shortness of breath.   Cardiovascular:  Negative for chest pain, palpitations and leg swelling.  Gastrointestinal:  Negative for abdominal pain.  Endocrine: Negative for polydipsia.  Skin:  Negative for rash.  Neurological:  Negative for dizziness, weakness and headaches.  Hematological:  Does not bruise/bleed easily.  All other systems reviewed and are negative.      Objective:   Physical Exam Vitals and nursing note reviewed.  Constitutional:      Appearance: Normal appearance. He is well-developed.  Neck:     Thyroid: No thyroid mass or thyromegaly.     Vascular: No carotid bruit or JVD.     Trachea: Phonation normal.  Cardiovascular:     Rate and Rhythm: Normal rate and regular rhythm.  Pulmonary:     Effort: Pulmonary effort is normal. No respiratory distress.     Breath sounds: Normal breath sounds.  Abdominal:     General: Bowel sounds are normal.     Palpations: Abdomen is soft.     Tenderness: There is no abdominal tenderness.  Musculoskeletal:        General: Normal range of motion.     Cervical back: Normal range of motion and neck supple.     Comments: In wheel chair  Lymphadenopathy:     Cervical: No cervical adenopathy.  Skin:    General: Skin is warm and dry.  Neurological:     Mental Status: He is alert and oriented to person, place, and time.  Psychiatric:        Behavior: Behavior normal.        Thought Content: Thought content normal.        Judgment: Judgment normal.    BP (!) 140/81   Pulse (!) 109   Temp 98.6 F (37 C) (Temporal)   Resp 20   SpO2 93%    HGBA1c 10.8%     Assessment & Plan:  Jacob Fowler in today with chief complaint of No chief complaint on file.   1. Uncontrolled type 2 diabetes mellitus with hyperglycemia, without long-term current use  of insulin [facility]) Telephone appointment with clinical pharmacist. Take ozempic '2mg'$  dose when get it - [facility] DCA Hb A1c Waived  2. Paronychia Bactrim as prescribed Soak in epsom salt BID  The above assessment and management plan was discussed with the patient. The patient verbalized understanding of and has agreed to the management plan. Patient is aware to call the clinic if symptoms persist or worsen. Patient is aware when to return to the clinic for a follow-up visit. Patient educated on when it is appropriate to go to the emergency department.   Mary-Margaret Hassell Done, FNP

## 2022-04-21 ENCOUNTER — Telehealth: Payer: Self-pay | Admitting: Pharmacist

## 2022-04-21 DIAGNOSIS — E1169 Type 2 diabetes mellitus with other specified complication: Secondary | ICD-10-CM

## 2022-04-21 NOTE — Telephone Encounter (Signed)
[facility]M referral  [phone] for pharmD services

## 2022-05-01 ENCOUNTER — Ambulatory Visit (HOSPITAL_COMMUNITY)
Admission: RE | Admit: 2022-05-01 | Discharge: 2022-05-01 | Disposition: A | Discharge status: Home / Self Care | Payer: PPO | Source: Ambulatory Visit | Attending: Urology | Admitting: Urology

## 2022-05-01 ENCOUNTER — Other Ambulatory Visit: Payer: Self-pay | Admitting: Family Medicine

## 2022-05-01 DIAGNOSIS — N200 Calculus of kidney: Secondary | ICD-10-CM | POA: Diagnosis not present

## 2022-05-01 DIAGNOSIS — E1122 Type 2 diabetes mellitus with diabetic chronic kidney disease: Secondary | ICD-10-CM | POA: Diagnosis not present

## 2022-05-01 DIAGNOSIS — R809 Proteinuria, unspecified: Secondary | ICD-10-CM | POA: Diagnosis not present

## 2022-05-01 DIAGNOSIS — R300 Dysuria: Secondary | ICD-10-CM | POA: Diagnosis not present

## 2022-05-01 DIAGNOSIS — N189 Chronic kidney disease, unspecified: Secondary | ICD-10-CM | POA: Diagnosis not present

## 2022-05-01 DIAGNOSIS — D638 Anemia in other chronic diseases classified elsewhere: Secondary | ICD-10-CM | POA: Diagnosis not present

## 2022-05-01 DIAGNOSIS — N1339 Other hydronephrosis: Secondary | ICD-10-CM | POA: Diagnosis not present

## 2022-05-01 DIAGNOSIS — N3289 Other specified disorders of bladder: Secondary | ICD-10-CM | POA: Diagnosis not present

## 2022-05-01 DIAGNOSIS — N170 Acute kidney failure with tubular necrosis: Secondary | ICD-10-CM | POA: Diagnosis not present

## 2022-05-01 DIAGNOSIS — E1129 Type 2 diabetes mellitus with other diabetic kidney complication: Secondary | ICD-10-CM | POA: Diagnosis not present

## 2022-05-01 DIAGNOSIS — E211 Secondary hyperparathyroidism, not elsewhere classified: Secondary | ICD-10-CM | POA: Diagnosis not present

## 2022-05-01 DIAGNOSIS — E039 Hypothyroidism, unspecified: Secondary | ICD-10-CM

## 2022-05-01 DIAGNOSIS — R82991 Hypocitraturia: Secondary | ICD-10-CM | POA: Diagnosis not present

## 2022-05-01 DIAGNOSIS — N1330 Unspecified hydronephrosis: Secondary | ICD-10-CM | POA: Diagnosis not present

## 2022-05-02 ENCOUNTER — Ambulatory Visit: Payer: PPO | Admitting: Neurology

## 2022-05-04 DIAGNOSIS — N1339 Other hydronephrosis: Secondary | ICD-10-CM | POA: Diagnosis not present

## 2022-05-04 DIAGNOSIS — D638 Anemia in other chronic diseases classified elsewhere: Secondary | ICD-10-CM | POA: Diagnosis not present

## 2022-05-04 DIAGNOSIS — R82991 Hypocitraturia: Secondary | ICD-10-CM | POA: Diagnosis not present

## 2022-05-04 DIAGNOSIS — N170 Acute kidney failure with tubular necrosis: Secondary | ICD-10-CM | POA: Diagnosis not present

## 2022-05-04 DIAGNOSIS — R809 Proteinuria, unspecified: Secondary | ICD-10-CM | POA: Diagnosis not present

## 2022-05-04 DIAGNOSIS — E1129 Type 2 diabetes mellitus with other diabetic kidney complication: Secondary | ICD-10-CM | POA: Diagnosis not present

## 2022-05-04 DIAGNOSIS — E1122 Type 2 diabetes mellitus with diabetic chronic kidney disease: Secondary | ICD-10-CM | POA: Diagnosis not present

## 2022-05-04 DIAGNOSIS — N200 Calculus of kidney: Secondary | ICD-10-CM | POA: Diagnosis not present

## 2022-05-04 DIAGNOSIS — G35 Multiple sclerosis: Secondary | ICD-10-CM | POA: Diagnosis not present

## 2022-05-04 DIAGNOSIS — N189 Chronic kidney disease, unspecified: Secondary | ICD-10-CM | POA: Diagnosis not present

## 2022-05-05 ENCOUNTER — Ambulatory Visit (INDEPENDENT_AMBULATORY_CARE_PROVIDER_SITE_OTHER): Payer: PPO | Admitting: Pharmacist

## 2022-05-05 DIAGNOSIS — N1832 Chronic kidney disease, stage 3b: Secondary | ICD-10-CM

## 2022-05-05 DIAGNOSIS — E1165 Type 2 diabetes mellitus with hyperglycemia: Secondary | ICD-10-CM

## 2022-05-08 ENCOUNTER — Telehealth: Payer: Self-pay

## 2022-05-08 NOTE — Telephone Encounter (Signed)
Daughter states his nephrologist sent patient an antibiotic for a "yeast infection".  She noticed his urine was dark and cloudy.  She is asking if the antibiotic would clear up his urine.  The daughter states they just started the antibiotic.  Daughter questioned if this antibiotic would clear up his dark urine.  I informed her since we are not treating the patient that we are not able to advise her to continue or d/c but to follow treatment plan set in place by treating provider an if she notices his symptoms do not improve or worsen to call back and we will have patient come in to be seen by provider.  Daughter voiced understanding.

## 2022-05-10 ENCOUNTER — Telehealth: Payer: Self-pay | Admitting: Pharmacist

## 2022-05-10 DIAGNOSIS — N170 Acute kidney failure with tubular necrosis: Secondary | ICD-10-CM | POA: Diagnosis not present

## 2022-05-10 DIAGNOSIS — N189 Chronic kidney disease, unspecified: Secondary | ICD-10-CM | POA: Diagnosis not present

## 2022-05-10 DIAGNOSIS — E1122 Type 2 diabetes mellitus with diabetic chronic kidney disease: Secondary | ICD-10-CM | POA: Diagnosis not present

## 2022-05-10 DIAGNOSIS — E211 Secondary hyperparathyroidism, not elsewhere classified: Secondary | ICD-10-CM | POA: Diagnosis not present

## 2022-05-10 DIAGNOSIS — R809 Proteinuria, unspecified: Secondary | ICD-10-CM | POA: Diagnosis not present

## 2022-05-10 DIAGNOSIS — E875 Hyperkalemia: Secondary | ICD-10-CM | POA: Diagnosis not present

## 2022-05-10 DIAGNOSIS — N1330 Unspecified hydronephrosis: Secondary | ICD-10-CM | POA: Diagnosis not present

## 2022-05-10 DIAGNOSIS — R338 Other retention of urine: Secondary | ICD-10-CM | POA: Diagnosis not present

## 2022-05-10 DIAGNOSIS — N200 Calculus of kidney: Secondary | ICD-10-CM | POA: Diagnosis not present

## 2022-05-10 DIAGNOSIS — E1129 Type 2 diabetes mellitus with other diabetic kidney complication: Secondary | ICD-10-CM | POA: Diagnosis not present

## 2022-05-10 NOTE — Telephone Encounter (Signed)
Spoke with Dr. Theador Hawthorne and patient is unable to take [place] due to hyperK+ and unable to take Wilder Glade due to recurrent UTIs, urinary retention and hydronephrosis  Med list and contraindications updated

## 2022-05-10 NOTE — Patient Instructions (Signed)
Visit Information  Following are the goals we discussed today:  Current Barriers:  Unable to independently afford treatment regimen Unable to achieve control of T2DM, CKD  Suboptimal therapeutic regimen for T2DM, CKD   Pharmacist Clinical Goal(s):  patient will verbalize ability to afford treatment regimen maintain control of T2DM, CKD as evidenced by IMPROVED LABS  through collaboration with PharmD and provider.   Interventions: 1:1 collaboration with Chevis Pretty, FNP regarding development and update of comprehensive plan of care as evidenced by provider attestation and co-signature Inter-disciplinary care team collaboration (see longitudinal plan of care) Comprehensive medication review performed; medication list updated in electronic medical record  Diabetes: New goal. Uncontrolled-A1C 10.8, GFR 28 (CKD 3B); current treatment:OZEMPIC '2MG'$ , STARTING FARXIGA '10MG'$  DAILY;  FARXIGA SAMPLES LEFT UP FRONT--FOR T2DM/CKD/CV WILL LIKELY NEED ADDITIONAL THERAPY WILL CONTINUE TO FOLLOW MAY NEED PATIENT ASSISTANCE Denies personal and family history of Medullary thyroid cancer (MTC) Current glucose readings: fasting glucose: 200, post prandial glucose: 200S Denies hypoglycemic/hyperglycemic symptoms Current exercise: N/A; ENCOURAGED Educated on T2DM, NEW FARXIGA, [place] Recommended NEW START [place]   Patient Goals/Self-Care Activities patient will:  - take medications as prescribed as evidenced by patient report and record review check glucose DAILY /FASTING, document, and provide at future appointments collaborate with provider on medication access solutions target a minimum of 150 minutes of moderate intensity exercise weekly   Plan: Telephone follow up appointment with care management team member scheduled for:  11.2023  Signature Regina Eck, PharmD, BCPS Clinical Pharmacist, [place]  II Phone  [phone]   Please call the care guide team at [phone] if you need to cancel or reschedule your appointment.   The patient verbalized understanding of instructions, educational materials, and care plan provided today and DECLINED offer to receive copy of patient instructions, educational materials, and care plan.

## 2022-05-10 NOTE — Progress Notes (Signed)
Chronic Care Management Pharmacy Note  05/05/2022 Name:  Compton Brigance MRN:  [id number] DOB:  1950-12-11  Summary:  Diabetes: New goal. Uncontrolled-A1C 10.8, GFR 28 (CKD 3B); current treatment:OZEMPIC 2MG, STARTING FARXIGA 10MG DAILY;  FARXIGA SAMPLES LEFT UP FRONT--FOR T2DM/CKD/CV WILL LIKELY NEED ADDITIONAL THERAPY WILL CONTINUE TO FOLLOW MAY NEED PATIENT ASSISTANCE Denies personal and family history of Medullary thyroid cancer (MTC) Current glucose readings: fasting glucose: 200, post prandial glucose: 200S Denies hypoglycemic/hyperglycemic symptoms Current exercise: N/A; ENCOURAGED Educated on T2DM, NEW FARXIGA, [place] Recommended NEW START [place]   Patient Goals/Self-Care Activities patient will:  - take medications as prescribed as evidenced by patient report and record review check glucose DAILY /FASTING, document, and provide at future appointments collaborate with provider on medication access solutions target a minimum of 150 minutes of moderate intensity exercise weekly  Subjective: Darus Hershman is an 71 y.o. year old male who is a primary patient of Chevis Pretty, [place].  The CCM team was consulted for assistance with disease management and care coordination needs.    Engaged with patient by telephone for initial visit in response to provider referral for pharmacy case management and/or care coordination services.   Consent to Services:  The patient was given information about Chronic Care Management services, agreed to services, and gave verbal consent prior to initiation of services.  Please see initial visit note for detailed documentation.   Patient Care Team: Chevis Pretty, FNP as PCP - General (Family Medicine) Felecia Shelling, Nanine Means, MD (Neurology) Minus Breeding, MD as Consulting Physician (Cardiology) Celestia Khat, OD (Optometry) Alyson Ingles Candee Furbish, MD as Consulting Physician (Urology)  Objective:  Lab  Results  Component Value Date   CREATININE 2.42 (H) 03/01/2022   CREATININE 2.65 (H) 01/07/2022   CREATININE 2.65 (H) 12/05/2021    Lab Results  Component Value Date   HGBA1C 10.8 (H) 04/17/2022   Last diabetic Eye exam:  Lab Results  Component Value Date/Time   HMDIABEYEEXA Retinopathy (A) 08/31/2017 12:00 AM    Last diabetic Foot exam: No results found for: "HMDIABFOOTEX"      Component Value Date/Time   CHOL 183 03/01/2022 1404   CHOL 196 01/06/2013 1311   TRIG 264 (H) 03/01/2022 1404   TRIG 76 08/15/2016 1008   TRIG 149 01/06/2013 1311   HDL 23 (L) 03/01/2022 1404   HDL 33 (L) 08/15/2016 1008   HDL 29 (L) 01/06/2013 1311   CHOLHDL 8.0 (H) 03/01/2022 1404   CHOLHDL 7.0 02/26/2009 0145   VLDL 22 02/26/2009 0145   LDLCALC 114 (H) 03/01/2022 1404   LDLCALC 77 01/28/2014 1139   LDLCALC 137 (H) 01/06/2013 1311       Latest Ref Rng & Units 03/01/2022    2:04 PM 01/07/2022   10:09 AM 11/30/2021    3:32 PM  Hepatic Function  Total Protein 6.0 - 8.5 g/dL 6.9  7.8  7.6   Albumin 3.8 - 4.8 g/dL 4.0  3.1  4.4   AST 0 - 40 IU/L 9  17  14    ALT 0 - 44 IU/L 8  22  21    Alk Phosphatase 44 - 121 IU/L 120  97  119   Total Bilirubin 0.0 - 1.2 mg/dL <0.2  0.5  <0.2     Lab Results  Component Value Date/Time   TSH 4.240 11/30/2021 03:32 PM   TSH 2.160 03/28/2021 10:24 AM   FREET4 1.16 11/30/2021 03:32 PM   FREET4 1.30 03/28/2021 10:24 AM  Latest Ref Rng & Units 03/01/2022    2:04 PM 01/07/2022   10:09 AM 11/30/2021    3:32 PM  CBC  WBC 3.4 - 10.8 x10E3/uL 10.6  6.0  9.4   Hemoglobin 13.0 - 17.7 g/dL 11.8  10.8  10.8   Hematocrit 37.5 - 51.0 % 37.4  32.8  33.7   Platelets 150 - 450 x10E3/uL 311  253  354     Lab Results  Component Value Date/Time   VD25OH 38.0 03/01/2022 02:04 PM   VD25OH 29.3 (L) 12/05/2021 09:22 AM    Clinical ASCVD: No  The 10-year ASCVD risk score (Arnett DK, et al., 2019) is: 55.9%   Values used to calculate the score:     Age: 71  years     Sex: Male     Is Non-Hispanic African American: No     Diabetic: Yes     Tobacco smoker: No     Systolic Blood Pressure: 599 mmHg     Is BP treated: Yes     HDL Cholesterol: 23 mg/dL     Total Cholesterol: 183 mg/dL    Other: (CHADS2VASc if Afib, PHQ9 if depression, MMRC or CAT for COPD, ACT, DEXA)  Social History   Tobacco Use  Smoking Status Former   Packs/day: 1.00   Years: 7.00   Total pack years: 7.00   Types: Cigarettes   Quit date: 09/26/1973   Years since quitting: 48.6  Smokeless Tobacco Current   Types: Chew  Tobacco Comments   Chewing tobacco   BP Readings from Last 3 Encounters:  04/17/22 (!) 140/81  03/22/22 112/78  03/01/22 115/72   Pulse Readings from Last 3 Encounters:  04/17/22 (!) 109  03/22/22 89  03/01/22 91   Wt Readings from Last 3 Encounters:  01/07/22 156 lb (70.8 kg)  11/21/21 154 lb (69.9 kg)  09/14/21 154 lb (69.9 kg)    Assessment: Review of patient past medical history, allergies, medications, health status, including review of consultants reports, laboratory and other test data, was performed as part of comprehensive evaluation and provision of chronic care management services.   SDOH:  (Social Determinants of Health) assessments and interventions performed:  SDOH Interventions    Flowsheet Row Patient Outreach Telephone from 03/15/2022 in [place] Interventions   Food Insecurity Interventions Intervention Not Indicated  Transportation Interventions Intervention Not Indicated       CCM Care Plan  No Known Allergies  Medications Reviewed Today     Reviewed by Lavera Guise, [facility] (Pharmacist) on 05/10/22 at 1248  Med List Status: <None>   Medication Order Taking? Sig Documenting Provider Last Dose Status Informant  Ascorbic Acid (VITAMIN C PO) [id number]  Take by mouth. [provider]  Active   aspirin 81 MG EC tablet [id number]  Take 81 mg by mouth daily.  [provider]  Active Family Member    Discontinued 09/27/11 1007 (Error)   Cholecalciferol (VITAMIN D) 50 MCG (2000 UT) tablet [id number]  Take 2,000 Units by mouth daily. [provider]  Active Self  cyanocobalamin (,VITAMIN B-12,) 1000 MCG/ML injection [id number]  INJECT 1ML INTO THE SKIN ONCE A MONTH Hendricks Limes F, FNP  Active   dapagliflozin propanediol (FARXIGA) 10 MG TABS tablet [id number] Yes Take 10 mg by mouth daily. [provider]  Active            Med Note Leisa Lenz May 10, 2022 12:47  PM) Samples   ferrous sulfate 325 (65 FE) MG tablet [id number]  Take 325 mg by mouth daily with breakfast. [provider]  Active   finasteride (PROSCAR) 5 MG tablet [id number]  Take 1 tablet (5 mg total) by mouth daily. McKenzie, Candee Furbish, MD  Active   glucose blood test strip [id number]  Use as instructed Chipper Herb, MD  Active Family Member  isosorbide mononitrate (IMDUR) 60 MG 24 hr tablet [id number]  TAKE 1/2 TABLET (30MG TOTAL) BY MOUTH DAILY Hendricks Limes F, FNP  Active   levothyroxine (SYNTHROID) 75 MCG tablet [id number]  TAKE ONE TABLET (75MCG TOTAL) BY MOUTH DAILY BEFORE Vincenza Hews, Mary-Margaret, FNP  Active     Discontinued 09/27/11 1008 (Error)   omeprazole (PRILOSEC) 20 MG capsule [id number]  Take 1 capsule (20 mg total) by mouth daily. [name], FNP  Active   Potassium Citrate 15 MEQ (1620 MG) TBCR [id number]   [provider]  Active   rosuvastatin (CRESTOR) 10 MG tablet [id number]  Take 1 tablet (10 mg total) by mouth daily. [name], FNP  Active   Semaglutide, 2 MG/DOSE, 8 MG/3ML Bon[place] Aid [id number]  Inject 2 mg as directed once a week. Hendricks Limes F, FNP  Active   silodosin (RAPAFLO) 8 MG CAPS capsule [id number]  Take 1 capsule (8 mg total) by mouth at bedtime. McKenzie, Candee Furbish, MD  Active   sodium bicarbonate 650 MG tablet [id number]  Take 650 mg by mouth 4 (four) times daily. [provider]   Active   sulfamethoxazole-trimethoprim (BACTRIM DS) 800-160 MG tablet [id number]  Take 1 tablet by mouth 2 (two) times daily. 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   Hypothyroidism 07/28/2020   Gastroesophageal reflux disease 07/28/2020   B12 deficiency 07/28/2020   Multiple falls 02/09/2020   Memory loss 08/11/2019   Vitamin D deficiency 04/25/2019   Irritable bowel syndrome with constipation 04/25/2019   RBBB 08/14/2018   Combined forms of age-related cataract of right eye 08/30/2017   Aortic atherosclerosis ([place]) 04/10/2016   Rosacea 11/23/2015   Right foot drop 02/17/2015   Spastic gait 09/16/2014   Multiple sclerosis ([place]) 01/06/2013   Atherosclerosis of native coronary artery of native heart with stable angina pectoris ([place]) 10/06/2009   Uncontrolled type 2 diabetes mellitus with hyperglycemia, without long-term current use of insulin ([place]) 10/01/2009   Hyperlipidemia associated with type 2 diabetes mellitus ([place]) 10/01/2009   Hypertension associated with type 2 diabetes mellitus ([place]) 10/01/2009    Immunization History  Administered Date(s) Administered   Fluad Quad(high Dose 65+) 04/25/2019, 05/10/2020, 06/01/2021, 04/17/2022   Influenza, High Dose Seasonal PF 04/10/2016, 05/17/2017, 06/10/2018   Influenza,inj,Quad PF,6+ Mos 05/14/2013, 05/27/2014, 07/05/2015   Pneumococcal Conjugate-13 05/27/2013   Pneumococcal Polysaccharide-23 04/15/2010, 10/04/2017   Td 02/16/2014   Tdap 02/16/2014    Conditions to be addressed/monitored: DMII and CKD Stage 3B  Care Plan : PHARMD MEDICATION MANAGEMENT  Updates made by Lavera Guise, [place] since 05/10/2022 12:00 AM     Problem: DISEASE PROGRESSION PREVENTION      Long-Range Goal: T2DM, CKD PHARMD   This Visit's  Progress: Not on track  Note:   Current Barriers:  Unable to independently afford treatment regimen Unable to achieve control of  T2DM, CKD  Suboptimal therapeutic regimen for T2DM, CKD   Pharmacist Clinical Goal(s):  patient will verbalize ability to afford treatment regimen maintain control of T2DM, CKD as evidenced by IMPROVED LABS  through collaboration with PharmD and provider.   Interventions: 1:1 collaboration with Chevis Pretty, FNP regarding development and update of comprehensive plan of care as evidenced by provider attestation and co-signature Inter-disciplinary care team collaboration (see longitudinal plan of care) Comprehensive medication review performed; medication list updated in electronic medical record  Diabetes: New goal. Uncontrolled-A1C 10.8, GFR 28 (CKD 3B); current treatment:OZEMPIC 2MG, STARTING FARXIGA 10MG DAILY;  FARXIGA SAMPLES LEFT UP FRONT--FOR T2DM/CKD/CV WILL LIKELY NEED ADDITIONAL THERAPY WILL CONTINUE TO FOLLOW MAY NEED PATIENT ASSISTANCE Denies personal and family history of Medullary thyroid cancer (MTC) Current glucose readings: fasting glucose: 200, post prandial glucose: 200S Denies hypoglycemic/hyperglycemic symptoms Current exercise: N/A; ENCOURAGED Educated on T2DM, NEW FARXIGA, [place] Recommended NEW START [place]   Patient Goals/Self-Care Activities patient will:  - take medications as prescribed as evidenced by patient report and record review check glucose DAILY /FASTING, document, and provide at future appointments collaborate with provider on medication access solutions target a minimum of 150 minutes of moderate intensity exercise weekly     Follow Up:  Patient agrees to Care Plan and Follow-up.  Plan: Telephone follow up appointment with care management team member scheduled for:  05/2022   Regina Eck, PharmD, BCPS Clinical Pharmacist, [place]  II Phone  [phone]

## 2022-05-15 DIAGNOSIS — R338 Other retention of urine: Secondary | ICD-10-CM | POA: Diagnosis not present

## 2022-05-15 DIAGNOSIS — N200 Calculus of kidney: Secondary | ICD-10-CM | POA: Diagnosis not present

## 2022-05-15 DIAGNOSIS — E211 Secondary hyperparathyroidism, not elsewhere classified: Secondary | ICD-10-CM | POA: Diagnosis not present

## 2022-05-15 DIAGNOSIS — N189 Chronic kidney disease, unspecified: Secondary | ICD-10-CM | POA: Diagnosis not present

## 2022-05-15 DIAGNOSIS — E875 Hyperkalemia: Secondary | ICD-10-CM | POA: Diagnosis not present

## 2022-05-15 DIAGNOSIS — E1129 Type 2 diabetes mellitus with other diabetic kidney complication: Secondary | ICD-10-CM | POA: Diagnosis not present

## 2022-05-15 DIAGNOSIS — N1330 Unspecified hydronephrosis: Secondary | ICD-10-CM | POA: Diagnosis not present

## 2022-05-15 DIAGNOSIS — R809 Proteinuria, unspecified: Secondary | ICD-10-CM | POA: Diagnosis not present

## 2022-05-15 DIAGNOSIS — N170 Acute kidney failure with tubular necrosis: Secondary | ICD-10-CM | POA: Diagnosis not present

## 2022-05-15 DIAGNOSIS — E1122 Type 2 diabetes mellitus with diabetic chronic kidney disease: Secondary | ICD-10-CM | POA: Diagnosis not present

## 2022-05-16 ENCOUNTER — Ambulatory Visit: Payer: Self-pay

## 2022-05-16 ENCOUNTER — Encounter: Payer: Self-pay | Admitting: *Deleted

## 2022-05-16 DIAGNOSIS — E1165 Type 2 diabetes mellitus with hyperglycemia: Secondary | ICD-10-CM

## 2022-05-16 NOTE — Chronic Care Management (AMB) (Signed)
   CCM RN Visit Note   May 16, 2022 Name: Raeshaun Simson MRN: [id number]      DOB: October 23, 1950  Subjective: Damarcus Reggio is a 71 y.o. year old male who is a primary care patient of Chevis Pretty, [place]. The patient was referred to the Chronic Care Management team for assistance with care management needs subsequent to provider initiation of CCM services and plan of care.      An unsuccessful telephone outreach was attempted today to contact the patient about Chronic Care Management needs.    Plan: A HIPAA compliant phone message was left for the patient providing contact information and requesting a return call.   Horris Latino RN Care Manager/Chronic Care Management [phone]

## 2022-05-16 NOTE — Patient Outreach (Signed)
[facility] NP notified that the CCM team will try to follow up with this pt. NP will retire from the care team.  Kayleen Memos C. Myrtie Neither, MSN, [facility] Gerontological Nurse Practitioner [facility] Care Management [phone]

## 2022-05-18 ENCOUNTER — Ambulatory Visit: Payer: Self-pay

## 2022-05-18 DIAGNOSIS — E1165 Type 2 diabetes mellitus with hyperglycemia: Secondary | ICD-10-CM

## 2022-05-18 NOTE — Chronic Care Management (AMB) (Signed)
   CCM RN Visit Note   May 18, 2022 Name: Jacob Fowler MRN: [id number]      DOB: 07-Sep-1950  Subjective: Jacob Fowler is a 71 y.o. year old male who is a primary care patient of Chevis Pretty, [place]. The patient was referred to the Chronic Care Management team for assistance with care management needs subsequent to provider initiation of CCM services and plan of care.      Second unsuccessful telephone outreach was attempted today to contact the patient about Chronic Care Management needs.    Plan: A member of the care management team will re attempt outreach with Jacob Fowler.    Horris Latino RN Care Manager/Chronic Care Management [phone]

## 2022-05-24 ENCOUNTER — Telehealth: Payer: Self-pay | Admitting: *Deleted

## 2022-05-24 NOTE — Telephone Encounter (Signed)
   CCM RN Visit Note   05/24/22 Name: Johnwilliam Shepperson MRN: [id number]      DOB: 1951/05/13  Subjective: Declin Rajan is a 71 y.o. year old male who is a primary care patient of Owens Loffler FNP.  The patient was referred to the Chronic Care Management team for assistance with care management needs subsequent to provider initiation of CCM services and plan of care.      Third unsuccessful telephone outreach was attempted today to contact the patient about Chronic Care Management needs.The patient was referred to the case management team by the provider who initiated CCM services. The provider has been notified of our unsuccessful attempts to make or maintain contact with the patient.   Plan:No further follow up required: unable to reach  Jacqlyn Larsen [facility], BSN RN Case Manager [place] [phone]

## 2022-05-26 ENCOUNTER — Telehealth: Payer: PPO

## 2022-06-17 ENCOUNTER — Other Ambulatory Visit: Payer: Self-pay

## 2022-06-17 ENCOUNTER — Inpatient Hospital Stay (HOSPITAL_COMMUNITY)
Admission: EM | Admit: 2022-06-17 | Discharge: 2022-06-19 | DRG: 689 | Disposition: A | Discharge status: Home / Self Care | Payer: PPO | Attending: Internal Medicine | Admitting: Internal Medicine

## 2022-06-17 ENCOUNTER — Encounter (HOSPITAL_COMMUNITY): Payer: Self-pay | Admitting: *Deleted

## 2022-06-17 DIAGNOSIS — Z7982 Long term (current) use of aspirin: Secondary | ICD-10-CM | POA: Diagnosis not present

## 2022-06-17 DIAGNOSIS — G9341 Metabolic encephalopathy: Secondary | ICD-10-CM | POA: Diagnosis not present

## 2022-06-17 DIAGNOSIS — N1832 Chronic kidney disease, stage 3b: Secondary | ICD-10-CM | POA: Diagnosis present

## 2022-06-17 DIAGNOSIS — E1165 Type 2 diabetes mellitus with hyperglycemia: Secondary | ICD-10-CM | POA: Diagnosis not present

## 2022-06-17 DIAGNOSIS — E875 Hyperkalemia: Secondary | ICD-10-CM | POA: Diagnosis not present

## 2022-06-17 DIAGNOSIS — N179 Acute kidney failure, unspecified: Secondary | ICD-10-CM | POA: Diagnosis present

## 2022-06-17 DIAGNOSIS — I129 Hypertensive chronic kidney disease with stage 1 through stage 4 chronic kidney disease, or unspecified chronic kidney disease: Secondary | ICD-10-CM | POA: Diagnosis not present

## 2022-06-17 DIAGNOSIS — E7800 Pure hypercholesterolemia, unspecified: Secondary | ICD-10-CM | POA: Diagnosis present

## 2022-06-17 DIAGNOSIS — N319 Neuromuscular dysfunction of bladder, unspecified: Secondary | ICD-10-CM | POA: Diagnosis present

## 2022-06-17 DIAGNOSIS — Z8051 Family history of malignant neoplasm of kidney: Secondary | ICD-10-CM | POA: Diagnosis not present

## 2022-06-17 DIAGNOSIS — Z7985 Long-term (current) use of injectable non-insulin antidiabetic drugs: Secondary | ICD-10-CM

## 2022-06-17 DIAGNOSIS — Z8052 Family history of malignant neoplasm of bladder: Secondary | ICD-10-CM

## 2022-06-17 DIAGNOSIS — Z8249 Family history of ischemic heart disease and other diseases of the circulatory system: Secondary | ICD-10-CM | POA: Diagnosis not present

## 2022-06-17 DIAGNOSIS — Z833 Family history of diabetes mellitus: Secondary | ICD-10-CM | POA: Diagnosis not present

## 2022-06-17 DIAGNOSIS — N390 Urinary tract infection, site not specified: Principal | ICD-10-CM

## 2022-06-17 DIAGNOSIS — E039 Hypothyroidism, unspecified: Secondary | ICD-10-CM | POA: Diagnosis not present

## 2022-06-17 DIAGNOSIS — K219 Gastro-esophageal reflux disease without esophagitis: Secondary | ICD-10-CM | POA: Diagnosis not present

## 2022-06-17 DIAGNOSIS — Z79890 Hormone replacement therapy (postmenopausal): Secondary | ICD-10-CM

## 2022-06-17 DIAGNOSIS — Z7401 Bed confinement status: Secondary | ICD-10-CM | POA: Diagnosis not present

## 2022-06-17 DIAGNOSIS — N401 Enlarged prostate with lower urinary tract symptoms: Secondary | ICD-10-CM | POA: Diagnosis present

## 2022-06-17 DIAGNOSIS — R4182 Altered mental status, unspecified: Secondary | ICD-10-CM | POA: Diagnosis not present

## 2022-06-17 DIAGNOSIS — E871 Hypo-osmolality and hyponatremia: Secondary | ICD-10-CM | POA: Diagnosis present

## 2022-06-17 DIAGNOSIS — E1122 Type 2 diabetes mellitus with diabetic chronic kidney disease: Secondary | ICD-10-CM | POA: Diagnosis not present

## 2022-06-17 DIAGNOSIS — Z823 Family history of stroke: Secondary | ICD-10-CM

## 2022-06-17 DIAGNOSIS — I2510 Atherosclerotic heart disease of native coronary artery without angina pectoris: Secondary | ICD-10-CM | POA: Diagnosis not present

## 2022-06-17 DIAGNOSIS — F17220 Nicotine dependence, chewing tobacco, uncomplicated: Secondary | ICD-10-CM | POA: Diagnosis present

## 2022-06-17 DIAGNOSIS — Z20822 Contact with and (suspected) exposure to covid-19: Secondary | ICD-10-CM | POA: Diagnosis present

## 2022-06-17 DIAGNOSIS — G35 Multiple sclerosis: Secondary | ICD-10-CM | POA: Diagnosis not present

## 2022-06-17 DIAGNOSIS — Z79899 Other long term (current) drug therapy: Secondary | ICD-10-CM

## 2022-06-17 HISTORY — PX: OTHER SURGICAL HISTORY: SHX169

## 2022-06-17 LAB — URINALYSIS, ROUTINE W REFLEX MICROSCOPIC
Bilirubin Urine: NEGATIVE
Glucose, UA: >=500 mg/dL — AB
Ketones, ur: NEGATIVE mg/dL
Nitrite: NEGATIVE
Protein, ur: 100 mg/dL — AB
Specific Gravity, Urine: 1.013 (ref 1.005–1.030)
WBC, UA: >50 WBC/hpf — ABNORMAL HIGH (ref 0–5)
pH: 7 (ref 5.0–8.0)

## 2022-06-17 LAB — CBC WITH DIFFERENTIAL/PLATELET
Abs Immature Granulocytes: 0.09 10*3/uL — ABNORMAL HIGH (ref 0.00–0.07)
Basophils Absolute: 0.1 10*3/uL (ref 0.0–0.1)
Basophils Relative: 0 %
Eosinophils Absolute: 0.2 10*3/uL (ref 0.0–0.5)
Eosinophils Relative: 1 %
HCT: 35.8 % — ABNORMAL LOW (ref 39.0–52.0)
Hemoglobin: 11.9 g/dL — ABNORMAL LOW (ref 13.0–17.0)
Immature Granulocytes: 1 %
Lymphocytes Relative: 8 %
Lymphs Abs: 1.3 10*3/uL (ref 0.7–4.0)
MCH: 28.6 pg (ref 26.0–34.0)
MCHC: 33.2 g/dL (ref 30.0–36.0)
MCV: 86.1 fL (ref 80.0–100.0)
Monocytes Absolute: 1.2 10*3/uL — ABNORMAL HIGH (ref 0.1–1.0)
Monocytes Relative: 8 %
Neutro Abs: 13.3 10*3/uL — ABNORMAL HIGH (ref 1.7–7.7)
Neutrophils Relative %: 82 %
Platelets: 276 10*3/uL (ref 150–400)
RBC: 4.16 MIL/uL — ABNORMAL LOW (ref 4.22–5.81)
RDW: 16.1 % — ABNORMAL HIGH (ref 11.5–15.5)
WBC: 16.2 10*3/uL — ABNORMAL HIGH (ref 4.0–10.5)
nRBC: 0 % (ref 0.0–0.2)

## 2022-06-17 LAB — GLUCOSE, CAPILLARY
Glucose-Capillary: 207 mg/dL — ABNORMAL HIGH (ref 70–99)
Glucose-Capillary: 258 mg/dL — ABNORMAL HIGH (ref 70–99)

## 2022-06-17 LAB — COMPREHENSIVE METABOLIC PANEL
ALT: 10 U/L (ref 0–44)
AST: 12 U/L — ABNORMAL LOW (ref 15–41)
Albumin: 3.7 g/dL (ref 3.5–5.0)
Alkaline Phosphatase: 86 U/L (ref 38–126)
Anion gap: 11 (ref 5–15)
BUN: 30 mg/dL — ABNORMAL HIGH (ref 8–23)
CO2: 25 mmol/L (ref 22–32)
Calcium: 9.7 mg/dL (ref 8.9–10.3)
Chloride: 96 mmol/L — ABNORMAL LOW (ref 98–111)
Creatinine, Ser: 2 mg/dL — ABNORMAL HIGH (ref 0.61–1.24)
GFR, Estimated: 35 mL/min — ABNORMAL LOW (ref >60)
Glucose, Bld: 310 mg/dL — ABNORMAL HIGH (ref 70–99)
Potassium: 5.5 mmol/L — ABNORMAL HIGH (ref 3.5–5.1)
Sodium: 132 mmol/L — ABNORMAL LOW (ref 135–145)
Total Bilirubin: 0.4 mg/dL (ref 0.3–1.2)
Total Protein: 8.4 g/dL — ABNORMAL HIGH (ref 6.5–8.1)

## 2022-06-17 LAB — SARS CORONAVIRUS 2 BY RT PCR: SARS Coronavirus 2 by RT PCR: NEGATIVE

## 2022-06-17 IMAGING — US US RENAL
1 series · 14 of 25 positions shown · non-contrast
Comparison: CT 01/13/2021, ultrasound 03/04/2021

CLINICAL DATA: Nephrolithiasis

EXAM:
RENAL / URINARY TRACT ULTRASOUND COMPLETE

[Series 1: us renal · 14 of 42 slices shown]
[im 1/42]
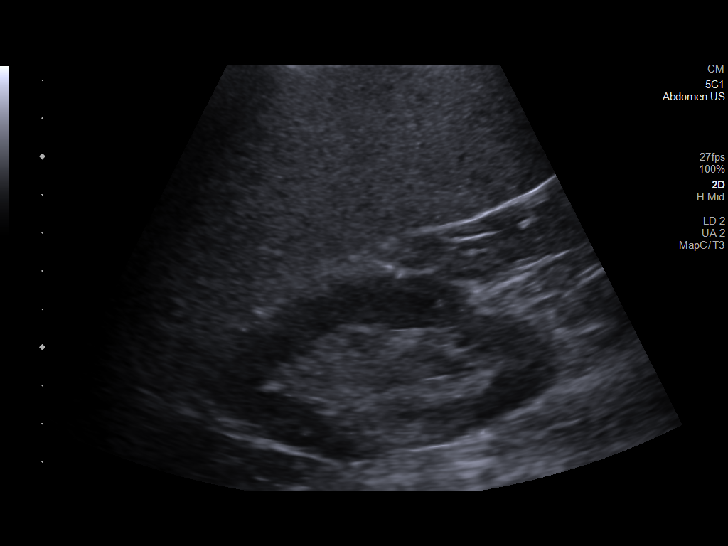
[im 4/42]
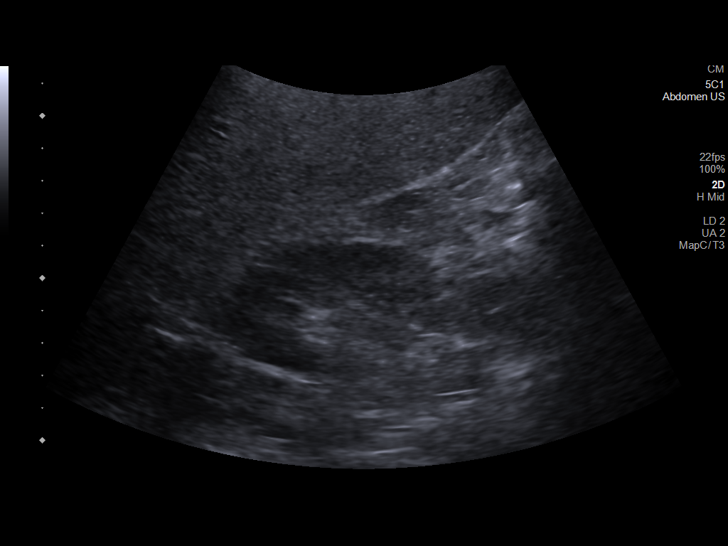
[im 7/42]
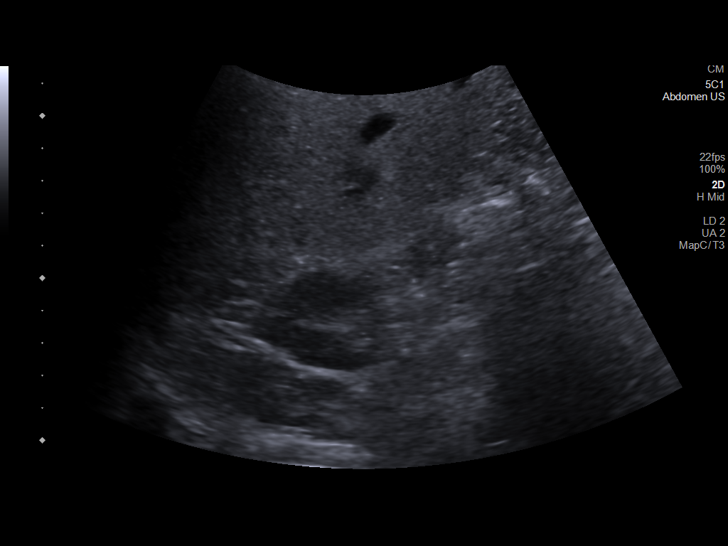
[im 11/42]
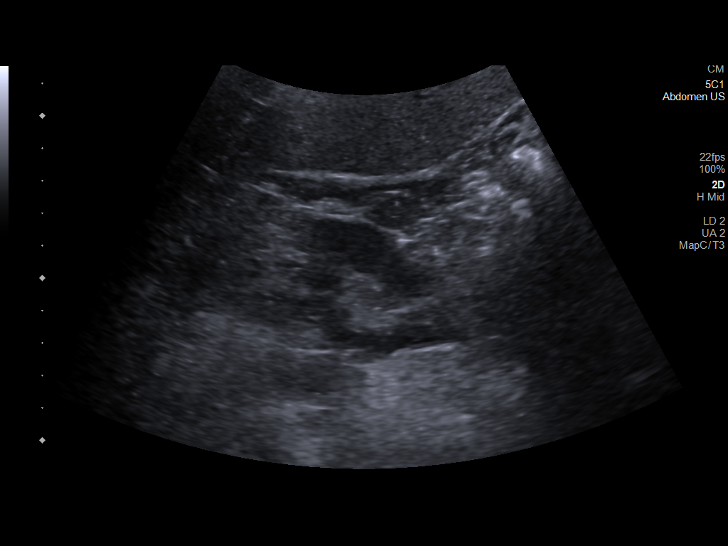
[im 14/42]
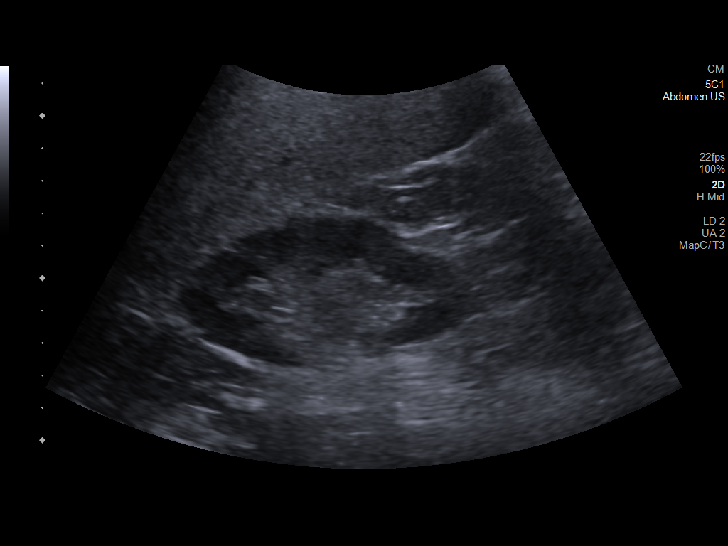
[im 16/42]
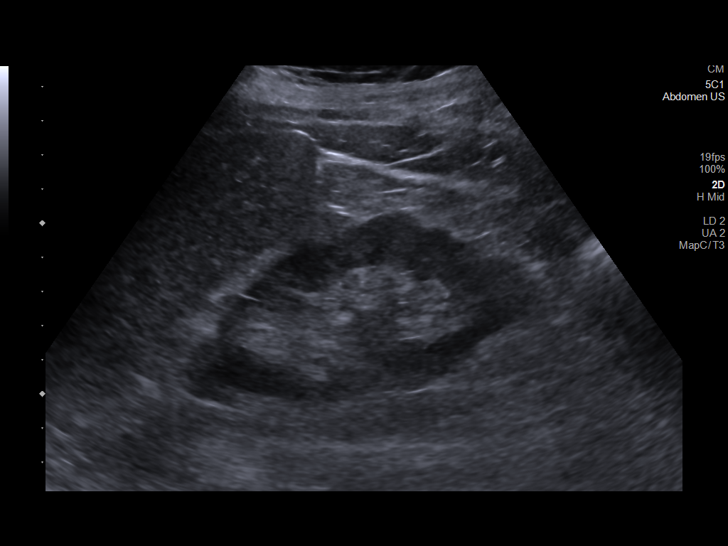
[im 19/42]
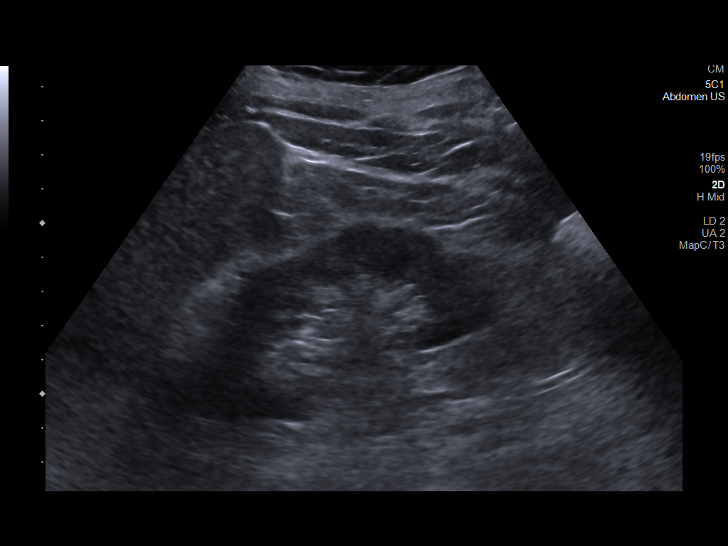
[im 23/42]
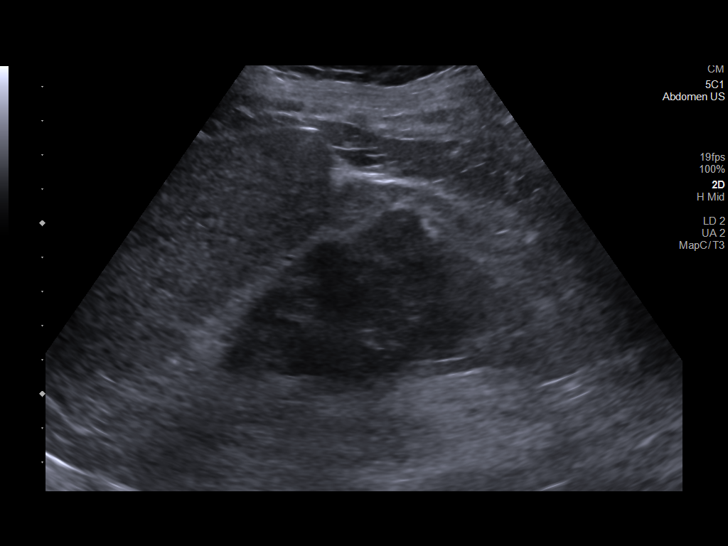
[im 26/42]
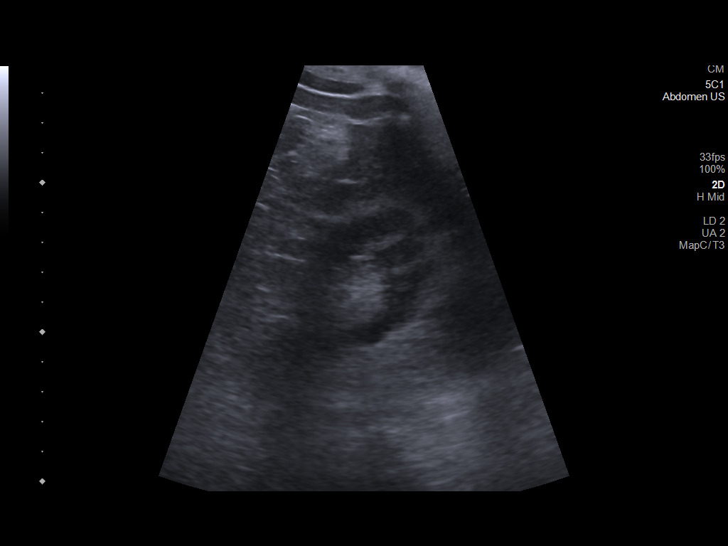
[im 28/42]
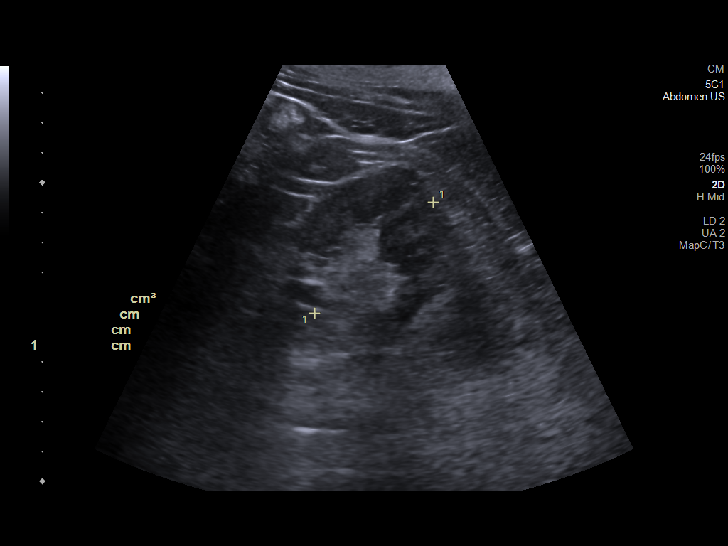
[im 31/42]
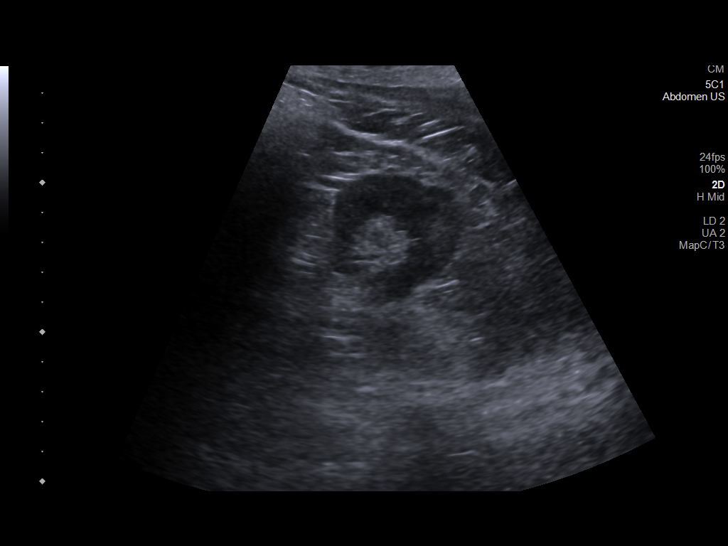
[im 35/42]
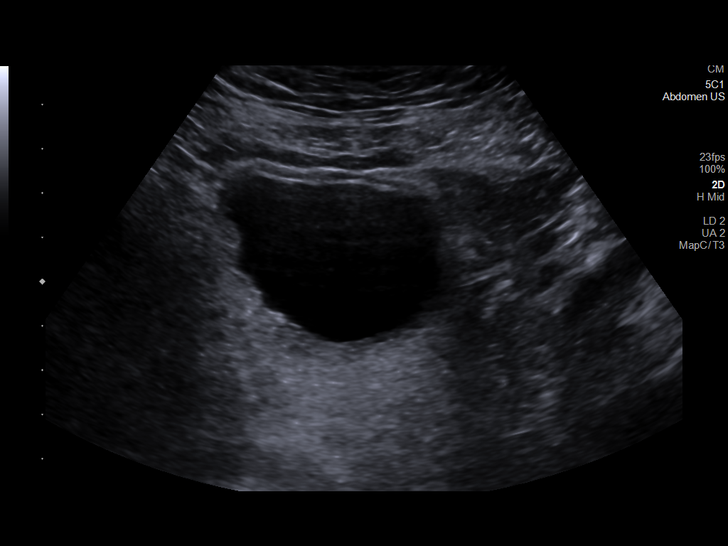
[im 38/42]
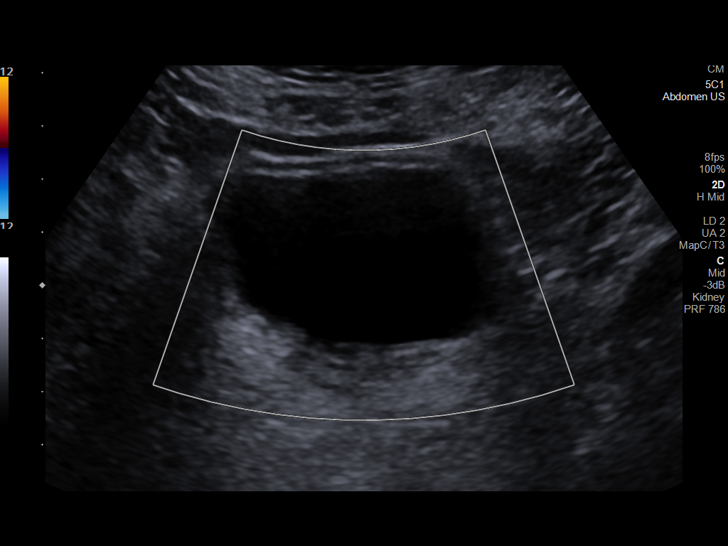
[im 42/42]
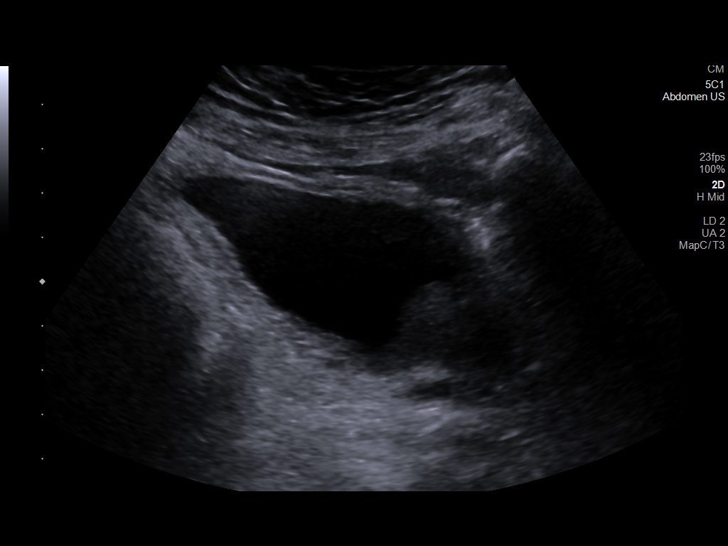

[14 of 25 positions shown; findings below may reference images not displayed]

FINDINGS: Right Kidney:

Renal measurements: 9.2 x 4.8 x 5.2 cm = volume: 119.8 mL.
Echogenicity within normal limits. No mass or hydronephrosis
visualized.

Left Kidney:

Renal measurements: 10.3 x 5.1 x 5.4 cm = volume: 147.4 mL.
Echogenicity within normal limits. No mass or hydronephrosis
visualized.

Bladder:

Appears normal for degree of bladder distention.

Other:

None.
IMPRESSION: Negative renal ultrasound

## 2022-06-17 MED ORDER — TAMSULOSIN HCL 0.4 MG PO CAPS
0.4000 mg | ORAL_CAPSULE | Freq: Every day | ORAL | Status: DC
  Administered 2022-06-17 – 2022-06-18 (×2): 0.4 mg via ORAL
  Filled 2022-06-17 (×2): qty 1

## 2022-06-17 MED ORDER — INSULIN ASPART 100 UNIT/ML IJ SOLN
0.0000 [IU] | INTRAMUSCULAR | Status: DC
  Administered 2022-06-17: 8 [IU] via SUBCUTANEOUS
  Administered 2022-06-17: 5 [IU] via SUBCUTANEOUS
  Administered 2022-06-18: 8 [IU] via SUBCUTANEOUS
  Administered 2022-06-18: 3 [IU] via SUBCUTANEOUS
  Administered 2022-06-18: 8 [IU] via SUBCUTANEOUS
  Administered 2022-06-18: 5 [IU] via SUBCUTANEOUS
  Administered 2022-06-18: 3 [IU] via SUBCUTANEOUS
  Administered 2022-06-19: 8 [IU] via SUBCUTANEOUS
  Administered 2022-06-19: 5 [IU] via SUBCUTANEOUS
  Administered 2022-06-19 (×2): 3 [IU] via SUBCUTANEOUS

## 2022-06-17 MED ORDER — ASPIRIN 81 MG PO TBEC
81.0000 mg | DELAYED_RELEASE_TABLET | Freq: Every day | ORAL | Status: DC
  Administered 2022-06-17 – 2022-06-19 (×3): 81 mg via ORAL
  Filled 2022-06-17 (×3): qty 1

## 2022-06-17 MED ORDER — SODIUM ZIRCONIUM CYCLOSILICATE 5 G PO PACK
10.0000 g | PACK | Freq: Once | ORAL | Status: AC
  Administered 2022-06-17: 10 g via ORAL
  Filled 2022-06-17: qty 2

## 2022-06-17 MED ORDER — ONDANSETRON HCL 4 MG/2ML IJ SOLN: 4.0000 mg | Freq: Four times a day (QID) | INTRAMUSCULAR | Status: DC | PRN

## 2022-06-17 MED ORDER — PANTOPRAZOLE SODIUM 40 MG PO TBEC
40.0000 mg | DELAYED_RELEASE_TABLET | Freq: Every day | ORAL | Status: DC
  Administered 2022-06-18 – 2022-06-19 (×2): 40 mg via ORAL
  Filled 2022-06-17 (×2): qty 1

## 2022-06-17 MED ORDER — SODIUM CHLORIDE 0.9 % IV BOLUS
1000.0000 mL | Freq: Once | INTRAVENOUS | Status: AC
  Administered 2022-06-17: 1000 mL via INTRAVENOUS

## 2022-06-17 MED ORDER — LEVOTHYROXINE SODIUM 50 MCG PO TABS
75.0000 ug | ORAL_TABLET | Freq: Every day | ORAL | Status: DC
  Administered 2022-06-18 – 2022-06-19 (×2): 75 ug via ORAL
  Filled 2022-06-17 (×2): qty 2

## 2022-06-17 MED ORDER — SODIUM CHLORIDE 0.9 % IV SOLN
1.0000 g | INTRAVENOUS | Status: DC
  Administered 2022-06-18 – 2022-06-19 (×2): 1 g via INTRAVENOUS
  Filled 2022-06-17 (×2): qty 10

## 2022-06-17 MED ORDER — FERROUS SULFATE 325 (65 FE) MG PO TABS
325.0000 mg | ORAL_TABLET | Freq: Every day | ORAL | Status: DC
  Administered 2022-06-18 – 2022-06-19 (×2): 325 mg via ORAL
  Filled 2022-06-17 (×2): qty 1

## 2022-06-17 MED ORDER — SODIUM BICARBONATE 650 MG PO TABS
650.0000 mg | ORAL_TABLET | Freq: Four times a day (QID) | ORAL | Status: DC
  Administered 2022-06-17 – 2022-06-19 (×8): 650 mg via ORAL
  Filled 2022-06-17 (×8): qty 1

## 2022-06-17 MED ORDER — SODIUM CHLORIDE 0.9 % IV SOLN
2.0000 g | Freq: Once | INTRAVENOUS | Status: AC
  Administered 2022-06-17: 2 g via INTRAVENOUS
  Filled 2022-06-17: qty 20

## 2022-06-17 MED ORDER — FINASTERIDE 5 MG PO TABS
5.0000 mg | ORAL_TABLET | Freq: Every day | ORAL | Status: DC
  Administered 2022-06-18 – 2022-06-19 (×2): 5 mg via ORAL
  Filled 2022-06-17 (×2): qty 1

## 2022-06-17 MED ORDER — ROSUVASTATIN CALCIUM 20 MG PO TABS
10.0000 mg | ORAL_TABLET | Freq: Every day | ORAL | Status: DC
  Administered 2022-06-17 – 2022-06-19 (×3): 10 mg via ORAL
  Filled 2022-06-17 (×3): qty 1

## 2022-06-17 MED ORDER — ACETAMINOPHEN 650 MG RE SUPP: 650.0000 mg | Freq: Four times a day (QID) | RECTAL | Status: DC | PRN

## 2022-06-17 MED ORDER — ACETAMINOPHEN 325 MG PO TABS
650.0000 mg | ORAL_TABLET | Freq: Four times a day (QID) | ORAL | Status: DC | PRN
  Administered 2022-06-18 – 2022-06-19 (×2): 650 mg via ORAL
  Filled 2022-06-17 (×2): qty 2

## 2022-06-17 MED ORDER — ONDANSETRON HCL 4 MG PO TABS: 4.0000 mg | ORAL_TABLET | Freq: Four times a day (QID) | ORAL | Status: DC | PRN

## 2022-06-17 MED ORDER — HEPARIN SODIUM (PORCINE) 5000 UNIT/ML IJ SOLN
5000.0000 [IU] | Freq: Three times a day (TID) | INTRAMUSCULAR | Status: DC
  Administered 2022-06-17 – 2022-06-19 (×6): 5000 [IU] via SUBCUTANEOUS
  Filled 2022-06-17 (×6): qty 1

## 2022-06-17 MED ORDER — LACTATED RINGERS IV SOLN: INTRAVENOUS | Status: AC

## 2022-06-17 NOTE — H&P (Signed)
History and Physical  Jacob Fowler HMC:[id number] DOB: 12-01-1950 DOA: 06/17/2022  Referring physician: Caryl Ada, PA PCP: Chevis Pretty, FNP   Chief Complaint: AMS  HPI: Jacob Fowler is a 71 y.o. male with a history of MS, CKD, T2DM, HTN, HLD, and hypothyroidism presenting to the ED for AMS. His family noticed mild confusion starting 06/16/22. He was more confused and somnolent this morning, which has happened before when he had previous UTIs. Other recent events include a change to his diabetes medication due to chronic kidney disease, glucose has been running from 200-298. He also had a recent fall to his knee in the shower on 06/15/22. He is being admitted for metabolic encephalopathy due to UTI.  ED Course: CBC showed WBC 16.2, Hgb 11.9. CMP showed Na 132, K+ 5.5, Cl 96, Glucose 310, BUN 30, Creatinine 2.0, GFR estimated 35. UA showed turbid, >500 glucose, small hgb dipstick, 100 protein, leukocytes, few bacteria. Urine culture was ordered and UTI treated empirically with rocephin. IVF given.   Review of Systems: Unable to complete due to AMS  Past Medical History:  Diagnosis Date   Acquired leg length discrepancy    Bladder stone    BPH associated with nocturia    CAD (coronary artery disease) CARDIOLOGIST- DR HOCHREIN--  LAST VISIT 09-27-2011 IN EPIC   Non obstructive Cath 2010-  HX CORONARY SPASM 2004   Cataract    bilateral    GERD (gastroesophageal reflux disease)    H/O hiatal hernia    Heart murmur    Heartburn    High cholesterol    History of nephrolithiasis 2009   Hx of adenomatous colonic polyps    Hyperlipidemia    Hypertension    Movement disorder    Multiple sclerosis ([place]) DX  10/10---  NEUROLOGIST  DR Delfino Lovett [facility] ([place])   RIGHT SIDE AFFECTED MORE W/ WEAKNESS- - USES CANE   Nephrolithiasis 08/30/2020   Neuromuscular disorder (HCC)    MS   NIDDM (non-insulin dependent diabetes mellitus)    Peyronie's disease    RBBB     Renal stone RIGHT   Rosacea    Vision abnormalities    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 / NORMAL LVF   CYSTOSCOPY W/ RETROGRADES  10/30/2011   Procedure: CYSTOSCOPY WITH RETROGRADE PYELOGRAM;  Surgeon: Claybon Jabs, MD;  Location: [place];  Service: Urology;  Laterality: Right;   CYSTOSCOPY WITH LITHOLAPAXY  10/30/2011   Procedure: CYSTOSCOPY WITH LITHOLAPAXY;  Surgeon: Claybon Jabs, MD;  Location[facility];  Service: Urology;  Laterality: N/A;   CYSTOSCOPY WITH RETROGRADE PYELOGRAM, URETEROSCOPY AND STENT PLACEMENT Left 11/25/2020   Procedure: CYSTOSCOPY WITH LEFT RETROGRADE PYELOGRAM, LEFT URETEROSCOPY WITH LASER AND LEFT URETERAL STENT PLACEMENT;  Surgeon: Cleon Gustin, MD;  Location: [facility];  Service: Urology;  Laterality: Left;   EXTRACORPOREAL SHOCK WAVE LITHOTRIPSY  09-05-2010   RIGHT   EYE SURGERY Right 08/30/2018   HOLMIUM LASER APPLICATION Left 6/62/9476   Procedure: HOLMIUM LASER APPLICATION;  Surgeon: Cleon Gustin, MD;  Location: [facility];  Service: Urology;  Laterality: Left;   NESBIT PROCEDURE  10-17-2004   CORRECTION OF PENILE ANGULATION (PEYRONIES DISEASE)   Social History:  reports that he quit smoking about 48 years ago. His smoking use included cigarettes. He has a 7.00 pack-year smoking history. His  smokeless tobacco use includes chew. He reports that he does not drink alcohol and does not use drugs.  Allergies  Allergen Reactions   Farxiga [Dapagliflozin] Other (See Comments)    unable to take Farxiga due to recurrent UTIs, urinary retention and hydronephrosis    Carrington Clamp [Finerenone] Other (See Comments)    hyperkalemia    Family History  Problem Relation Age of Onset   Stroke Mother    Heart attack Mother    Diabetes Mother    Hypertension Mother    Renal cancer Father     Diabetes Sister    Heart attack Brother    Diabetes Son    Heart attack Maternal Grandfather    Heart attack Brother    Bladder Cancer Brother     Prior to Admission medications   Medication Sig Start Date End Date Taking? Authorizing Provider  Ascorbic Acid (VITAMIN C PO) Take by mouth.    [provider]  aspirin 81 MG EC tablet Take 81 mg by mouth daily.    [provider]  Cholecalciferol (VITAMIN D) 50 MCG (2000 UT) tablet Take 2,000 Units by mouth daily.    [provider]  cyanocobalamin (,VITAMIN B-12,) 1000 MCG/ML injection INJECT 1ML INTO THE SKIN ONCE A MONTH 01/27/22   Hendricks Limes F, FNP  ferrous sulfate 325 (65 FE) MG tablet Take 325 mg by mouth daily with breakfast.    [provider]  finasteride (PROSCAR) 5 MG tablet Take 1 tablet (5 mg total) by mouth daily. 03/22/22   McKenzie, Candee Furbish, MD  glucose blood test strip Use as instructed 07/06/14   Chipper Herb, MD  isosorbide mononitrate (IMDUR) 60 MG 24 hr tablet TAKE 1/2 TABLET ('30MG'$  TOTAL) BY MOUTH DAILY 01/03/22   Hendricks Limes F, FNP  levothyroxine (SYNTHROID) 75 MCG tablet TAKE ONE TABLET (75MCG TOTAL) BY MOUTH DAILY BEFORE BREAKFAST 05/01/22   Hassell Done, Mary-Margaret, FNP  omeprazole (PRILOSEC) 20 MG capsule Take 1 capsule (20 mg total) by mouth daily. 03/01/22   [name], FNP  Potassium Citrate 15 MEQ (1620 MG) TBCR  02/13/22   [provider]  rosuvastatin (CRESTOR) 10 MG tablet Take 1 tablet (10 mg total) by mouth daily. 03/01/22   [name], FNP  Semaglutide, 2 MG/DOSE, 8 MG/3ML SOPN Inject 2 mg as directed once a week. 03/01/22   [name], FNP  silodosin (RAPAFLO) 8 MG CAPS capsule Take 1 capsule (8 mg total) by mouth at bedtime. 03/22/22   McKenzie, Candee Furbish, MD  sodium bicarbonate 650 MG tablet Take 650 mg by mouth 4 (four) times daily.    [provider]  sulfamethoxazole-trimethoprim (BACTRIM DS) 800-160 MG tablet Take 1 tablet by mouth 2  (two) times daily. 04/17/22   Hassell Done, Mary-Margaret, FNP  atorvastatin (LIPITOR) 10 MG tablet Take 10 mg by mouth daily.  09/27/11  [provider]  niacin-simvastatin (SIMCOR) 500-20 MG 24 hr tablet Take 1 tablet by mouth at bedtime.    09/27/11  [provider]   Physical Exam: Vitals:   06/17/22 0900 06/17/22 1000 06/17/22 1130 06/17/22 1300  BP: 137/78 112/77 115/75 103/73  Pulse: 92 87 88 92  Resp: 18 17 (!) 21 (!) 22  Temp: 99.5 F (37.5 C)     TempSrc: Rectal     SpO2: 95% 99% 99% 97%  Weight:      Height:        General exam: Moderately built and nourished patient, lying comfortably  supine on the gurney in no obvious distress. Somnolent and a bit confused Head, eyes and ENT: Nontraumatic and normocephalic. Pupils equally reacting to light and accommodation. Oral mucosa moist. Neck: Supple. No JVD, carotid bruit or thyromegaly. Lymphatics: No lymphadenopathy. Respiratory system: Clear to auscultation. No increased work of breathing. Cardiovascular system: S1 and S2 heard, RRR. No JVD, murmurs, gallops, clicks or pedal edema. Gastrointestinal system: Abdomen is nondistended, soft and nontender. Normal bowel sounds heard. No organomegaly or masses appreciated. Central nervous system: Confused and somnolent. No focal neurological deficits. Extremities: No gross deformities or weakness.  Skin: No rashes or acute findings. Musculoskeletal system: Negative exam. Psychiatry: Pleasant and cooperative.  Labs on Admission:  Basic Metabolic Panel: Recent Labs  Lab 06/17/22 1013  NA 132*  K 5.5*  CL 96*  CO2 25  GLUCOSE 310*  BUN 30*  CREATININE 2.00*  CALCIUM 9.7   Liver Function Tests: Recent Labs  Lab 06/17/22 1013  AST 12*  ALT 10  ALKPHOS 86  BILITOT 0.4  PROT 8.4*  ALBUMIN 3.7   No results for input(s): "LIPASE", "AMYLASE" in the last 168 hours. No results for input(s): "AMMONIA" in the last 168 hours. CBC: Recent Labs  Lab 06/17/22 1013   WBC 16.2*  NEUTROABS 13.3*  HGB 11.9*  HCT 35.8*  MCV 86.1  PLT 276   Cardiac Enzymes: No results for input(s): "CKTOTAL", "CKMB", "CKMBINDEX", "TROPONINI" in the last 168 hours.  BNP (last 3 results) No results for input(s): "PROBNP" in the last 8760 hours. CBG: No results for input(s): "GLUCAP" in the last 168 hours.  Radiological Exams on Admission: No results found.   Assessment/Plan  Acute Metabolic Encephalopathy Secondary to UTI -Rocephin empirically until cultures return -WBC 16.2, continue to monitor with CBC -No tachycardia, BP 103/73, r/o sepsis -Cannot fully void due to MS, consider foley catheter -Due to MS affecting bladder function, consider urology consult if no improvement.   Hyponatremia -Na 132 -Known Hypothyroidism -IVF repletion -Monitor with BMP  Hyperkalemia -K+ 5.5 -Potentially due to CKD -Give Lokelma -Monitor with BMP  Multiple Sclerosis -Bed bound -Cannot void fully, consider catheter  AKI in the setting of CKD Stage 3b -BUN 30, Cr. 2.00, GFR est 35. Monitor with BMP. -IVF for hydration -Avoid nephrotoxic agents  T2DM -Glucose 310 -Continue home Ozempic -Diabetes coordinator consult -q4 glucose checks  HTN -continue home isosorbide mononitrate -BP 103/73 1300 06/17/22  HLD -Continue home Crestor  Hypothyroidism -Last TSH 4.240 on 11/30/21 -Continue home levothyroxine  DVT Prophylaxis: Heparin Code Status: Full  Family Communication: Daughter and Wife at bedside  Disposition Plan: Home after resolution of symptoms   Time spent: 70mn  Jacob Fowler D Valerio Pinard, OMS IV [facility] How to contact the [facility] HospitalAttending or Consulting provider [place] covering provider during after hours [place] for this patient?  Check the care team in [facility] look for a) attending/consulting TR[facility] provider listed and b) the [facility] Hospitalteam listed Log into [email or web address] and use [place]'s universal password to access. If you do not have the  password, please contact the hospital operator. Locate the TRidge[facility] Instituteprovider you are looking for under [facility] and page to a number that you can be directly reached. If you still have difficulty reaching the provider, please page the [facility](Director on Call) for the Hospitalists listed on amion for assistance.

## 2022-06-17 NOTE — Progress Notes (Signed)
Patient with sacral decubitus present on admission. Patient has area of redness to entire sacral area, with pin point open areas, no drainage. Patient also has redness to area of right and left thigh, but blanches. Left foot second toe has purple bruise area that does not blanche. Patient has redness to his groin and his scrotum. Patient has new mepilex to area, and has been started on q2 turn regimen. Patient's family in room and shown areas and updated. Family remains at bedside.

## 2022-06-17 NOTE — ED Provider Notes (Signed)
[facility] EMERGENCY DEPARTMENT Provider Note   CSN: [id number] Arrival date & time: 06/17/22  1761     History  Chief Complaint  Patient presents with   Altered Mental Status    Jacob Fowler is a 71 y.o. male.  Patient is here with the family who provides most of his history patient has a past medical history of MS he is no longer able to ambulate.  Patient reports that he has been less responsive than usual.  They report normally with this patient has a urinary tract infection and this is what happens.  Patient's wife also reports that he recently has had his diabetes medicine adjusted because this was causing him to have kidney complications.  Patient denies having any pain anywhere family reports he has not had a fever or chills he does not have any new areas of weakness  The history is provided by the patient and the spouse. No language interpreter was used.  Altered Mental Status Severity:  Moderate Most recent episode:  Yesterday Timing:  Constant Progression:  Worsening Associated symptoms: no abdominal pain and no fever        Home Medications Prior to Admission medications   Medication Sig Start Date End Date Taking? Authorizing Provider  Ascorbic Acid (VITAMIN C PO) Take by mouth.    [provider]  aspirin 81 MG EC tablet Take 81 mg by mouth daily.    [provider]  Cholecalciferol (VITAMIN D) 50 MCG (2000 UT) tablet Take 2,000 Units by mouth daily.    [provider]  cyanocobalamin (,VITAMIN B-12,) 1000 MCG/ML injection INJECT 1ML INTO THE SKIN ONCE A MONTH 01/27/22   Hendricks Limes F, FNP  ferrous sulfate 325 (65 FE) MG tablet Take 325 mg by mouth daily with breakfast.    [provider]  finasteride (PROSCAR) 5 MG tablet Take 1 tablet (5 mg total) by mouth daily. 03/22/22   McKenzie, Candee Furbish, MD  glucose blood test strip Use as instructed 07/06/14   Chipper Herb, MD  isosorbide mononitrate (IMDUR) 60 MG 24 hr  tablet TAKE 1/2 TABLET ('30MG'$  TOTAL) BY MOUTH DAILY 01/03/22   Hendricks Limes F, FNP  levothyroxine (SYNTHROID) 75 MCG tablet TAKE ONE TABLET (75MCG TOTAL) BY MOUTH DAILY BEFORE BREAKFAST 05/01/22   Hassell Done, Mary-Margaret, FNP  omeprazole (PRILOSEC) 20 MG capsule Take 1 capsule (20 mg total) by mouth daily. 03/01/22   [name], FNP  Potassium Citrate 15 MEQ (1620 MG) TBCR  02/13/22   [provider]  rosuvastatin (CRESTOR) 10 MG tablet Take 1 tablet (10 mg total) by mouth daily. 03/01/22   [name], FNP  Semaglutide, 2 MG/DOSE, 8 MG/3ML SOPN Inject 2 mg as directed once a week. 03/01/22   [name], FNP  silodosin (RAPAFLO) 8 MG CAPS capsule Take 1 capsule (8 mg total) by mouth at bedtime. 03/22/22   McKenzie, Candee Furbish, MD  sodium bicarbonate 650 MG tablet Take 650 mg by mouth 4 (four) times daily.    [provider]  sulfamethoxazole-trimethoprim (BACTRIM DS) 800-160 MG tablet Take 1 tablet by mouth 2 (two) times daily. 04/17/22   Hassell Done, Mary-Margaret, FNP  atorvastatin (LIPITOR) 10 MG tablet Take 10 mg by mouth daily.  09/27/11  [provider]  niacin-simvastatin (SIMCOR) 500-20 MG 24 hr tablet Take 1 tablet by mouth at bedtime.    09/27/11  [provider]      Allergies    Wilder Glade [dapagliflozin] and Carrington Clamp [  finerenone]    Review of Systems   Review of Systems  Constitutional:  Negative for fever.  Gastrointestinal:  Negative for abdominal pain.  All other systems reviewed and are negative.   Physical Exam Updated Vital Signs BP 137/78 (BP Location: Left Arm)   Pulse 92   Temp 99.5 F (37.5 C) (Rectal)   Resp 18   Ht '5\' 6"'$  (1.676 m)   Wt 72.6 kg   SpO2 95%   BMI 25.82 kg/m  Physical Exam Vitals and nursing note reviewed.  Constitutional:      Appearance: He is well-developed.  HENT:     Head: Normocephalic.     Nose: Nose normal.     Mouth/Throat:     Mouth: Mucous membranes are moist.  Eyes:     Pupils: Pupils are  equal, round, and reactive to light.  Cardiovascular:     Rate and Rhythm: Normal rate and regular rhythm.  Pulmonary:     Effort: Pulmonary effort is normal.  Abdominal:     General: Abdomen is flat. There is no distension.  Musculoskeletal:     Cervical back: Normal range of motion and neck supple.  Skin:    General: Skin is warm.  Neurological:     Mental Status: He is alert. Mental status is at baseline.  Psychiatric:        Mood and Affect: Mood normal.     ED Results / Procedures / Treatments   Labs (all labs ordered are listed, but only abnormal results are displayed) Labs Reviewed  CBC WITH DIFFERENTIAL/PLATELET - Abnormal; Notable for the following components:      Result Value   WBC 16.2 (*)    RBC 4.16 (*)    Hemoglobin 11.9 (*)    HCT 35.8 (*)    RDW 16.1 (*)    Neutro Abs 13.3 (*)    Monocytes Absolute 1.2 (*)    Abs Immature Granulocytes 0.09 (*)    All other components within normal limits  COMPREHENSIVE METABOLIC PANEL - Abnormal; Notable for the following components:   Sodium 132 (*)    Potassium 5.5 (*)    Chloride 96 (*)    Glucose, Bld 310 (*)    BUN 30 (*)    Creatinine, Ser 2.00 (*)    Total Protein 8.4 (*)    AST 12 (*)    GFR, Estimated 35 (*)    All other components within normal limits  URINALYSIS, ROUTINE W REFLEX MICROSCOPIC - Abnormal; Notable for the following components:   APPearance TURBID (*)    Glucose, UA >=500 (*)    Hgb urine dipstick SMALL (*)    Protein, ur 100 (*)    Leukocytes,Ua LARGE (*)    WBC, UA >50 (*)    Bacteria, UA FEW (*)    All other components within normal limits  SARS CORONAVIRUS 2 BY RT PCR    EKG None  Radiology No results found.  Procedures Procedures    Medications Ordered in ED Medications - No data to display  ED Course/ Medical Decision Making/ A&P                           Medical Decision Making Pt's family reports pt has had weakness and confusion.  They reports pt has this happen  when he develops a uti   Amount and/or Complexity of Data Reviewed Independent Historian: caregiver    Details: Pt here with wife  and daughter  External Data Reviewed: notes.    Details: Nephrology and primary care notes reviewed  Labs: ordered. Decision-making details documented in ED Course.    Details: Labs ordered reviewed and interpreted patient has a white blood cell count of Discussion of management or test interpretation with external provider(s): Hospitalist consulted for admission  Risk Decision regarding hospitalization. Risk Details: Is given IV fluids and IV Rocephin.  I discussed the results with the patient's family we will admit him to the hospital for further treatment  Of 16.2 patient's urinalysis shows large leukocytes greater than 50 white blood cells and few bacteria.  Patient's potassium is slightly elevated at 5.5         Final Clinical Impression(s) / ED Diagnoses Final diagnoses:  Urinary tract infection without hematuria, site unspecified  Altered mental status, unspecified altered mental status type    Rx / DC Orders ED Discharge Orders     None         Fransico Meadow, [place] 06/17/22 1837    Varney Biles, MD 06/18/22 1358

## 2022-06-17 NOTE — ED Triage Notes (Addendum)
Pt brought in by wife and daughter with c/o AMS since this morning. Family reports pt is slower to respond. Pt has MS and they report when he gets like this he usually has a UTI because his bladder doesn't empty completely due to the MS. They do report pt's urine is clear and light yellow in color. Temperature of 99 at home. Pt was recently taken off his diabetic medications because it was hurting his kidneys and was placed on Ozempic. Family report his sugars have ran between 200-298 over the last week. Pt also fell 1 week ago and landed on his right knee and c/o pain to that area when the knee is bent.

## 2022-06-18 DIAGNOSIS — Z7982 Long term (current) use of aspirin: Secondary | ICD-10-CM | POA: Diagnosis not present

## 2022-06-18 DIAGNOSIS — E871 Hypo-osmolality and hyponatremia: Secondary | ICD-10-CM | POA: Diagnosis present

## 2022-06-18 DIAGNOSIS — Z8051 Family history of malignant neoplasm of kidney: Secondary | ICD-10-CM | POA: Diagnosis not present

## 2022-06-18 DIAGNOSIS — K219 Gastro-esophageal reflux disease without esophagitis: Secondary | ICD-10-CM | POA: Diagnosis present

## 2022-06-18 DIAGNOSIS — Z8249 Family history of ischemic heart disease and other diseases of the circulatory system: Secondary | ICD-10-CM | POA: Diagnosis not present

## 2022-06-18 DIAGNOSIS — F17220 Nicotine dependence, chewing tobacco, uncomplicated: Secondary | ICD-10-CM | POA: Diagnosis present

## 2022-06-18 DIAGNOSIS — Z833 Family history of diabetes mellitus: Secondary | ICD-10-CM | POA: Diagnosis not present

## 2022-06-18 DIAGNOSIS — E7800 Pure hypercholesterolemia, unspecified: Secondary | ICD-10-CM | POA: Diagnosis present

## 2022-06-18 DIAGNOSIS — G35 Multiple sclerosis: Secondary | ICD-10-CM | POA: Diagnosis present

## 2022-06-18 DIAGNOSIS — I2510 Atherosclerotic heart disease of native coronary artery without angina pectoris: Secondary | ICD-10-CM | POA: Diagnosis present

## 2022-06-18 DIAGNOSIS — Z8052 Family history of malignant neoplasm of bladder: Secondary | ICD-10-CM | POA: Diagnosis not present

## 2022-06-18 DIAGNOSIS — N319 Neuromuscular dysfunction of bladder, unspecified: Secondary | ICD-10-CM | POA: Diagnosis present

## 2022-06-18 DIAGNOSIS — E875 Hyperkalemia: Secondary | ICD-10-CM | POA: Diagnosis present

## 2022-06-18 DIAGNOSIS — Z823 Family history of stroke: Secondary | ICD-10-CM | POA: Diagnosis not present

## 2022-06-18 DIAGNOSIS — N1832 Chronic kidney disease, stage 3b: Secondary | ICD-10-CM | POA: Diagnosis present

## 2022-06-18 DIAGNOSIS — E1122 Type 2 diabetes mellitus with diabetic chronic kidney disease: Secondary | ICD-10-CM | POA: Diagnosis present

## 2022-06-18 DIAGNOSIS — Z7401 Bed confinement status: Secondary | ICD-10-CM | POA: Diagnosis not present

## 2022-06-18 DIAGNOSIS — N390 Urinary tract infection, site not specified: Secondary | ICD-10-CM | POA: Diagnosis present

## 2022-06-18 DIAGNOSIS — N179 Acute kidney failure, unspecified: Secondary | ICD-10-CM | POA: Diagnosis present

## 2022-06-18 DIAGNOSIS — G9341 Metabolic encephalopathy: Secondary | ICD-10-CM | POA: Diagnosis present

## 2022-06-18 DIAGNOSIS — E039 Hypothyroidism, unspecified: Secondary | ICD-10-CM | POA: Diagnosis present

## 2022-06-18 DIAGNOSIS — N401 Enlarged prostate with lower urinary tract symptoms: Secondary | ICD-10-CM | POA: Diagnosis present

## 2022-06-18 DIAGNOSIS — Z20822 Contact with and (suspected) exposure to covid-19: Secondary | ICD-10-CM | POA: Diagnosis present

## 2022-06-18 DIAGNOSIS — I129 Hypertensive chronic kidney disease with stage 1 through stage 4 chronic kidney disease, or unspecified chronic kidney disease: Secondary | ICD-10-CM | POA: Diagnosis present

## 2022-06-18 LAB — MAGNESIUM: Magnesium: 2.1 mg/dL (ref 1.7–2.4)

## 2022-06-18 LAB — BASIC METABOLIC PANEL
Anion gap: 7 (ref 5–15)
BUN: 26 mg/dL — ABNORMAL HIGH (ref 8–23)
CO2: 25 mmol/L (ref 22–32)
Calcium: 9.2 mg/dL (ref 8.9–10.3)
Chloride: 103 mmol/L (ref 98–111)
Creatinine, Ser: 1.79 mg/dL — ABNORMAL HIGH (ref 0.61–1.24)
GFR, Estimated: 40 mL/min — ABNORMAL LOW (ref >60)
Glucose, Bld: 131 mg/dL — ABNORMAL HIGH (ref 70–99)
Potassium: 3.9 mmol/L (ref 3.5–5.1)
Sodium: 135 mmol/L (ref 135–145)

## 2022-06-18 LAB — CBC
HCT: 32.2 % — ABNORMAL LOW (ref 39.0–52.0)
Hemoglobin: 10.6 g/dL — ABNORMAL LOW (ref 13.0–17.0)
MCH: 28.5 pg (ref 26.0–34.0)
MCHC: 32.9 g/dL (ref 30.0–36.0)
MCV: 86.6 fL (ref 80.0–100.0)
Platelets: 265 10*3/uL (ref 150–400)
RBC: 3.72 MIL/uL — ABNORMAL LOW (ref 4.22–5.81)
RDW: 16 % — ABNORMAL HIGH (ref 11.5–15.5)
WBC: 12.1 10*3/uL — ABNORMAL HIGH (ref 4.0–10.5)
nRBC: 0 % (ref 0.0–0.2)

## 2022-06-18 LAB — GLUCOSE, CAPILLARY
Glucose-Capillary: 116 mg/dL — ABNORMAL HIGH (ref 70–99)
Glucose-Capillary: 157 mg/dL — ABNORMAL HIGH (ref 70–99)
Glucose-Capillary: 164 mg/dL — ABNORMAL HIGH (ref 70–99)
Glucose-Capillary: 231 mg/dL — ABNORMAL HIGH (ref 70–99)
Glucose-Capillary: 267 mg/dL — ABNORMAL HIGH (ref 70–99)
Glucose-Capillary: 283 mg/dL — ABNORMAL HIGH (ref 70–99)

## 2022-06-18 NOTE — Care Management Obs Status (Signed)
[place] NOTIFICATION   Patient Details  Name: Jacob Fowler MRN: [id number] Date of Birth: Nov 17, 1950   Medicare Observation Status Notification Given:  Yes    Iona Beard, [place] 06/18/2022, 10:50 AM

## 2022-06-18 NOTE — Progress Notes (Signed)
  Transition of Care [facility]) Screening Note   Patient Details  Name: Jacob Fowler Date of Birth: 1951/05/19   Transition of Care [facility]) CM/SW Contact:    Iona Beard, [place] Phone Number: 06/18/2022, 10:50 AM    Transition of Care Department [facility]) has reviewed patient and no TOC needs have been identified at this time. We will continue to monitor patient advancement through interdisciplinary progression rounds. If new patient transition needs arise, please place a TOC consult.

## 2022-06-18 NOTE — Progress Notes (Addendum)
Patient resting comfortably with family at bedside. No needs or complaints at this time. Discussed attempting to get patient oob into a recliner but per MD Manuella Ghazi he did not feel this was safe as he is bedbound at home.

## 2022-06-18 NOTE — Progress Notes (Signed)
PROGRESS NOTE   Jacob Fowler  ELF:[id number] DOB: 1951/04/27 DOA: 06/17/2022 PCP: Chevis Pretty, FNP    Level of care: Telemetry  Brief Admission History:  Jacob Fowler is a 71 y.o. male with a history of MS, CKD, T2DM, HTN, HLD, and hypothyroidism who presented to the ED for AMS on 06/17/22. His family noticed mild confusion starting 06/16/22. This has happened before when he had previous UTIs. Other recent events include a change to his diabetes medication due to chronic kidney disease, glucose has been running from 200-298. He also had a recent fall to his knee in the shower on 06/15/22. He was admitted for metabolic encephalopathy due to UTI. Upon admission, he was found to have some pressure ulcers and was placed on a turn regiment as he is mostly bed-bound.  Assessment and Plan:  Acute Metabolic Encephalopathy Secondary to UTI  -Rocephin empirically until cultures return -WBC 12.1, continue to monitor with CBC -No tachycardia, BP stable, r/o sepsis -Cannot fully void due to MS, consider foley catheter -Urine and Blood cultures pending. -Due to MS affecting bladder function, consider urology consult if no improvement.  --Pt showing improvement in mental status and function, appropriate for outpatient urology follow up at this time.    Hyponatremia  -Na 135-resolving -Known Hypothyroidism -Continue IVF -Monitor with BMP   Hyperkalemia  -K+ 3.9-resolving -Potentially due to CKD -Gave Lokelma 06/17/22 -Monitor with BMP   Multiple Sclerosis -Bed bound -Noted to have some pressure sores, movement protocol in place.  -Cannot void fully, consider catheter   AKI in the setting of CKD Stage 3b  -BUN 26, Cr. 1.79, GFR est 40. Monitor with BMP. -IVF for hydration -Avoid nephrotoxic agents   T2DM  -Glucose 131 on BMP -Hold home Ozempic -ISS while in hospital with hypoglycemic protocol --Family wishes to continue insulin schedule outpatient if  possible due to increased control -Diabetes coordinator consult -q4 glucose checks   HTN -hold home isosorbide mononitrate -BP 134/64 0432 06/18/22   HLD -Continue home Crestor   Hypothyroidism -Last TSH 4.240 on 11/30/21 -Continue home levothyroxine  DVT prophylaxis: Heparin Code Status: Full Family Communication: daughter and wife at bedside Disposition: Status is: Observation The patient remains OBS appropriate and will d/c before 2 midnights.   Consultants:  None Procedures:  None Antimicrobials:  Rocephin first dose given 06/17/22 day 2 Subjective: Jacob Fowler was seen this morning lying in bed. His daughter and wife were at bedside. He states he feels well this morning. He denies pain, confusion, and other complaints. His wife and daughter support his improvement overnight. Patient and family are happy with his progress. Objective: Vitals:   06/17/22 2033 06/18/22 0034 06/18/22 0432 06/18/22 1050  BP: 124/70 127/70 134/64 120/79  Pulse: 85 86 85 81  Resp: '18 18 18 18  '$ Temp: 97.9 F (36.6 C) 98 F (36.7 C) 98.4 F (36.9 C) 98.5 F (36.9 C)  TempSrc: Oral Oral Oral   SpO2: 98% 100% 99% 98%  Weight:      Height:        Intake/Output Summary (Last 24 hours) at 06/18/2022 1242 Last data filed at 06/18/2022 0900 Gross per 24 hour  Intake 2094.66 ml  Output --  Net 2094.66 ml   Filed Weights   06/17/22 0854 06/17/22 1625  Weight: 72.6 kg 66.5 kg   Examination:  General exam: Appears calm and comfortable  Respiratory system: Clear to auscultation. Respiratory effort normal. Cardiovascular system: normal S1 & S2 heard. No JVD, murmurs,  rubs, gallops or clicks. No pedal edema. Gastrointestinal system: Abdomen is nondistended, soft and nontender. No organomegaly or masses felt. Normal bowel sounds heard. Central nervous system: Alert and oriented. No focal neurological deficits. Extremities: No gross weakness or deformities  Skin: No rashes, lesions. Ulcers  reported by nurse but not directly checked this exam.  Psychiatry: Judgement and insight appear normal. Mood & affect appropriate.   Data Reviewed: I have personally reviewed following labs and imaging studies  CBC:    Latest Ref Rng & Units 06/18/2022    5:14 AM 06/17/2022   10:13 AM 03/01/2022    2:04 PM  CBC  WBC 4.0 - 10.5 K/uL 12.1  16.2  10.6   Hemoglobin 13.0 - 17.0 g/dL 10.6  11.9  11.8   Hematocrit 39.0 - 52.0 % 32.2  35.8  37.4   Platelets 150 - 400 K/uL 265  276  311      Basic Metabolic Panel:    Latest Ref Rng & Units 06/18/2022    5:14 AM 06/17/2022   10:13 AM 03/01/2022    2:04 PM  BMP  Glucose 70 - 99 mg/dL 131  310  403   BUN 8 - 23 mg/dL '26  30  29   '$ Creatinine 0.61 - 1.24 mg/dL 1.79  2.00  2.42   BUN/Creat Ratio 10 - 24   12   Sodium 135 - 145 mmol/L 135  132  131   Potassium 3.5 - 5.1 mmol/L 3.9  5.5  4.7   Chloride 98 - 111 mmol/L 103  96  92   CO2 22 - 32 mmol/L '25  25  22   '$ Calcium 8.9 - 10.3 mg/dL 9.2  9.7  9.6      CBG: Recent Labs  Lab 06/17/22 2036 06/18/22 0033 06/18/22 0435 06/18/22 0833 06/18/22 1152  GLUCAP 258* 157* 116* 164* 267*    Recent Results (from the past 240 hour(s))  SARS Coronavirus 2 by RT PCR (hospital order, performed in [facility] hospital lab) *cepheid single result test* Anterior Nasal Swab     Status: None   Collection Time: 06/17/22 10:22 AM   Specimen: Anterior Nasal Swab  Result Value Ref Range Status   SARS Coronavirus 2 by RT PCR NEGATIVE NEGATIVE Final    Comment: (NOTE) SARS-CoV-2 target nucleic acids are NOT DETECTED.  The SARS-CoV-2 RNA is generally detectable in upper and lower respiratory specimens during the acute phase of infection. The lowest concentration of SARS-CoV-2 viral copies this assay can detect is 250 copies / mL. A negative result does not preclude SARS-CoV-2 infection and should not be used as the sole basis for treatment or other patient management decisions.  A negative result may  occur with improper specimen collection / handling, submission of specimen other than nasopharyngeal swab, presence of viral mutation(s) within the areas targeted by this assay, and inadequate number of viral copies (<250 copies / mL). A negative result must be combined with clinical observations, patient history, and epidemiological information.  Fact Sheet for Patients:   https://www.patel.info/  Fact Sheet for Healthcare Providers: https://hall.com/  This test is not yet approved or  cleared by the [place] FDA and has been authorized for detection and/or diagnosis of SARS-CoV-2 by FDA under an Emergency Use Authorization (EUA).  This EUA will remain in effect (meaning this test can be used) for the duration of the COVID-19 declaration under Section 564(b)(1) of the Act, 21 U.S.C. section 360bbb-3(b)(1), unless the authorization is terminated  or revoked sooner.  Performed at [facility], [address], [place],  [address]      Radiology Studies: No results found.  Scheduled Meds:  aspirin EC  81 mg Oral Daily   ferrous sulfate  325 mg Oral Q breakfast   finasteride  5 mg Oral Daily   heparin  5,000 Units Subcutaneous Q8H   insulin aspart  0-15 Units Subcutaneous Q4H   levothyroxine  75 mcg Oral Q0600   pantoprazole  40 mg Oral Daily   rosuvastatin  10 mg Oral Daily   sodium bicarbonate  650 mg Oral QID   tamsulosin  0.4 mg Oral QPC supper   Continuous Infusions:  cefTRIAXone (ROCEPHIN)  IV 1 g (06/18/22 0531)     LOS: 0 days   Time spent: 35 mins  Missy Sabins, OMS IV How to contact the [facility] Attending or Consulting provider [place] or covering provider during after hours [place], for this patient?  Check the care team in [facility] and look for a) attending/consulting TR[facility] provider listed and b) the [facility] team listed Log into [email or web address] and use [facility] universal password to access. If you do not have the  password, please contact the hospital operator. Locate the [facility] provider you are looking for under [facility] and page to a number that you can be directly reached. If you still have difficulty reaching the provider, please page the [facility] (Director on Call) for the Hospitalists listed on amion for assistance.  06/18/2022, 12:42 PM

## 2022-06-19 ENCOUNTER — Telehealth (HOSPITAL_COMMUNITY): Payer: Self-pay | Admitting: Pharmacy Technician

## 2022-06-19 ENCOUNTER — Other Ambulatory Visit (HOSPITAL_COMMUNITY): Payer: Self-pay

## 2022-06-19 DIAGNOSIS — G9341 Metabolic encephalopathy: Secondary | ICD-10-CM | POA: Diagnosis not present

## 2022-06-19 LAB — BASIC METABOLIC PANEL
Anion gap: 8 (ref 5–15)
BUN: 28 mg/dL — ABNORMAL HIGH (ref 8–23)
CO2: 25 mmol/L (ref 22–32)
Calcium: 8.8 mg/dL — ABNORMAL LOW (ref 8.9–10.3)
Chloride: 102 mmol/L (ref 98–111)
Creatinine, Ser: 1.98 mg/dL — ABNORMAL HIGH (ref 0.61–1.24)
GFR, Estimated: 35 mL/min — ABNORMAL LOW (ref >60)
Glucose, Bld: 184 mg/dL — ABNORMAL HIGH (ref 70–99)
Potassium: 3.4 mmol/L — ABNORMAL LOW (ref 3.5–5.1)
Sodium: 135 mmol/L (ref 135–145)

## 2022-06-19 LAB — URINE CULTURE
Culture: 20000 — AB
Special Requests: NORMAL

## 2022-06-19 LAB — CBC
HCT: 30.3 % — ABNORMAL LOW (ref 39.0–52.0)
Hemoglobin: 9.8 g/dL — ABNORMAL LOW (ref 13.0–17.0)
MCH: 28.3 pg (ref 26.0–34.0)
MCHC: 32.3 g/dL (ref 30.0–36.0)
MCV: 87.6 fL (ref 80.0–100.0)
Platelets: 273 10*3/uL (ref 150–400)
RBC: 3.46 MIL/uL — ABNORMAL LOW (ref 4.22–5.81)
RDW: 15.8 % — ABNORMAL HIGH (ref 11.5–15.5)
WBC: 10.5 10*3/uL (ref 4.0–10.5)
nRBC: 0 % (ref 0.0–0.2)

## 2022-06-19 LAB — MAGNESIUM: Magnesium: 2 mg/dL (ref 1.7–2.4)

## 2022-06-19 LAB — GLUCOSE, CAPILLARY
Glucose-Capillary: 177 mg/dL — ABNORMAL HIGH (ref 70–99)
Glucose-Capillary: 181 mg/dL — ABNORMAL HIGH (ref 70–99)
Glucose-Capillary: 216 mg/dL — ABNORMAL HIGH (ref 70–99)
Glucose-Capillary: 267 mg/dL — ABNORMAL HIGH (ref 70–99)

## 2022-06-19 LAB — HEMOGLOBIN A1C
Hgb A1c MFr Bld: 10.2 % — ABNORMAL HIGH (ref 4.8–5.6)
Mean Plasma Glucose: 246 mg/dL

## 2022-06-19 LAB — MRSA NEXT GEN BY PCR, NASAL: MRSA by PCR Next Gen: NOT DETECTED

## 2022-06-19 MED ORDER — POTASSIUM CHLORIDE CRYS ER 20 MEQ PO TBCR
40.0000 meq | EXTENDED_RELEASE_TABLET | Freq: Once | ORAL | Status: AC
  Administered 2022-06-19: 40 meq via ORAL
  Filled 2022-06-19: qty 2

## 2022-06-19 MED ORDER — INSULIN GLARGINE-YFGN 100 UNIT/ML ~~LOC~~ SOLN
14.0000 [IU] | Freq: Every day | SUBCUTANEOUS | Status: DC
  Administered 2022-06-19: 14 [IU] via SUBCUTANEOUS
  Filled 2022-06-19 (×2): qty 0.14

## 2022-06-19 MED ORDER — INSULIN GLARGINE 100 UNITS/ML SOLOSTAR PEN: 14.0000 [IU] | PEN_INJECTOR | Freq: Every day | SUBCUTANEOUS | 11 refills | Status: DC

## 2022-06-19 MED ORDER — INSULIN PEN NEEDLE 31G X 5 MM MISC: 2 refills | Status: DC

## 2022-06-19 NOTE — TOC Benefit Eligibility Note (Signed)
Patient Research scientist (life sciences) completed.     The patient is currently admitted and upon discharge could be taking Lantus, Basaglar, Tyler Aas.   The current 30 day co-pay is, $35 per month for Lantus or Tresiba, but Engineer, agricultural isn't on formulary.   The patient is insured through [employer].

## 2022-06-19 NOTE — Plan of Care (Signed)
Patient discharged home with spouse and daughter.

## 2022-06-19 NOTE — Telephone Encounter (Signed)
Pharmacy Patient Advocate Encounter  Insurance verification completed.    The patient is insured through [employer]   The patient is currently admitted and ran test claims for the following: Lantus, Basaglar, Tresiba.  Copays and coinsurance results were relayed to Inpatient clinical team.

## 2022-06-19 NOTE — Inpatient Diabetes Management (Addendum)
Inpatient Diabetes Program Recommendations  AACE/ADA: New Consensus Statement on Inpatient Glycemic Control (2015)  Target Ranges:  Prepandial:   less than 140 mg/dL      Peak postprandial:   less than 180 mg/dL (1-2 hours)      Critically ill patients:  140 - 180 mg/dL   Lab Results  Component Value Date   GLUCAP 181 (H) 06/19/2022   HGBA1C 10.8 (H) 04/17/2022    Review of Glycemic Control  Latest Reference Range & Units 06/18/22 20:25 06/19/22 00:37 06/19/22 04:07 06/19/22 07:20  Glucose-Capillary 70 - 99 mg/dL 283 (H) 216 (H) 177 (H) 181 (H)  (H): Data is abnormally high Diabetes history: Type 2 DM Outpatient Diabetes medications: Ozempic 2 mg qwk Current orders for Inpatient glycemic control: Novolog 0-15 units Q4H  Inpatient Diabetes Program Recommendations:    Consider adding Semglee 14 units QD (to start now).   Will order insulin starter kit. Secure chat sent to MD and [employer] for benefits check.   Spoke with patient's daughter regarding outpatient diabetes management. She helps provide care at home for her father; patient was sleeping during conversation. Reviewed patient's current A1c of 10.8%. Explained what a A1c is and what it measures. Also reviewed goal A1c with patient, importance of good glucose control @ home, and blood sugar goals. Reviewed patho of DM, need for improved control, risk for infection with poor glycemic control, survival skills, interventions, vascular changes and commorbidities.  Patient has supplies. Reports checking once per day. Reviewed recommended frequency and when to call MD. Already working to be mindful of CHO and avoids sugary beverages.  Recently, was taken off oral antidiabetic agents with PCP (Metformin & Actos).  Educated patient's daughter on insulin pen use at home. Reviewed contents of insulin flexpen starter kit. Reviewed all steps if insulin pen including attachment of needle, 2-unit air shot, dialing up dose, giving  injection, removing needle, disposal of sharps, storage of unused insulin, disposal of insulin etc. Patient and family used to self administering with Ozempic and they feel confident. Video links sent to daughter for review as well. Also reviewed troubleshooting with insulin pen. MD to give patient Rxs for insulin pens and insulin pen needles.  No questions at this time.  At discharge:  -Lantus insulin pen $35 #[id number] -insulin pen needles #[id number]  Thanks, Bronson Curb, MSN, RNC-OB Diabetes Coordinator [phone] (8a-5p)     Thanks, Bronson Curb, MSN, RNC-OB Diabetes Coordinator [phone] (8a-5p)

## 2022-06-19 NOTE — Discharge Summary (Signed)
[name]:[id number] DOB: 10-Aug-1950 DOA: 06/17/2022  PCP: Chevis Pretty, FNP  Admit date: 06/17/2022 Discharge date: 06/19/2022  Admitted From: ED Disposition: Home Principle Discharge Diagnosis: Acute Metabolic Encephalopathy Secondary to UTI   Recommendations for Outpatient Follow-up:  Follow up with PCP in 1 weeks for follow up.  Consider follow up with Endocrinology for diabetes management, now on insulin  Continue to turn as much as possible to avoid pressure sores.  Today should be the last day of Rocephin, should not need further antibiotics.   Home Health: As before admission  Discharge Condition: Stable  CODE STATUS: Full   Brief Hospitalization Summary: Please see all hospital notes, images, labs for full details of the hospitalization. Jacob Fowler is a 71 y.o. male with a history of MS, CKD, T2DM, HTN, HLD, and hypothyroidism who presented to the ED for AMS on 06/17/22. His family noticed mild confusion starting 06/16/22. This has happened before when he had previous UTIs. Other recent events include a change to his diabetes medication due to chronic kidney disease, glucose has been running from 200-298. He also had a recent fall to his knee in the shower on 06/15/22. He was admitted for metabolic encephalopathy due to UTI. Upon admission, he was found to have some pressure ulcers and was placed on a turn regiment as he is mostly bed-bound. His family asked to work with the diabetes coordinator on a better home regiment for his glucose. He was discharged on an insulin regiment to better assist with control. Upon discharge he is stable and at baseline mentation. He finished the course of rocephin in hospital and he and family are prepared for discharge.   Discharge Diagnoses:  Primary: Acute Metabolic Encephalopathy Secondary to UTI  Hyponatremia-Resolved Hyperkalemia-Resolved  Multiple Sclerosis CKD Stage 3b  T2DM   HTN HLD Hypothyroidism   Discharge Instructions: Discharge Instructions     Diet - low sodium heart healthy   Complete by: As directed    Increase activity slowly   Complete by: As directed       Allergies as of 06/19/2022       Reactions   Farxiga [dapagliflozin] Other (See Comments)   unable to take Farxiga due to recurrent UTIs, urinary retention and hydronephrosis   Kerendia [finerenone] Other (See Comments)   hyperkalemia        Medication List     STOP taking these medications    sulfamethoxazole-trimethoprim 800-160 MG tablet Commonly known as: Bactrim DS       TAKE these medications    aspirin EC 81 MG tablet Take 81 mg by mouth daily.   cyanocobalamin 1000 MCG/ML injection Commonly known as: VITAMIN B12 INJECT 1ML INTO THE SKIN ONCE A MONTH   ferrous sulfate 325 (65 FE) MG tablet Take 325 mg by mouth daily with breakfast.   finasteride 5 MG tablet Commonly known as: PROSCAR Take 1 tablet (5 mg total) by mouth daily.   glucose blood test strip Use as instructed   insulin glargine 100 unit/mL Sopn Commonly known as: LANTUS Inject 14 Units into the skin daily.   Insulin Pen Needle 31G X 5 MM Misc Use with lantus pen as directed   isosorbide mononitrate 60 MG 24 hr tablet Commonly known as: IMDUR TAKE 1/2 TABLET ('30MG'$  TOTAL) BY MOUTH DAILY   levothyroxine 75 MCG tablet Commonly known as: SYNTHROID TAKE ONE TABLET (75MCG TOTAL) BY MOUTH DAILY BEFORE BREAKFAST   omeprazole 20 MG capsule Commonly known as: PRILOSEC Take 1  capsule (20 mg total) by mouth daily.   rosuvastatin 10 MG tablet Commonly known as: Crestor Take 1 tablet (10 mg total) by mouth daily.   Semaglutide (2 MG/DOSE) 8 MG/3ML Sopn Inject 2 mg as directed once a week.   silodosin 8 MG Caps capsule Commonly known as: RAPAFLO Take 1 capsule (8 mg total) by mouth at bedtime.   sodium bicarbonate 650 MG tablet Take 650 mg by mouth 4 (four) times daily.   Vitamin D  50 MCG (2000 UT) tablet Take 2,000 Units by mouth daily.        Follow-up Information     Chevis Pretty, FNP. Schedule an appointment as soon as possible for a visit in 1 week(s).   Specialty: Family Medicine Contact information: [place] [address] [phone]                Allergies  Allergen Reactions   Farxiga [Dapagliflozin] Other (See Comments)    unable to take Farxiga due to recurrent UTIs, urinary retention and hydronephrosis    Carrington Clamp [Finerenone] Other (See Comments)    hyperkalemia     Procedures/Studies: No results found.   Subjective: [name] was seen lying in bed today. Daughter and wife were present in room. Pt states he is feeling better and is ready for discharge. Family are in agreement, but wish to talk to the diabetes coordinator about his sugar control outpatient. He and family have no further complaints.   Discharge Exam: Vitals:   06/18/22 1050 06/18/22 1243 06/18/22 2027 06/19/22 0501  BP: 120/79 105/67 (!) 127/96 119/77  Pulse: 81 80 95 91  Resp: '18 19 20 18  '$ Temp: 98.5 F (36.9 C) (!) 97.4 F (36.3 C) 98.8 F (37.1 C) 98.2 F (36.8 C)  TempSrc:      SpO2: 98% 100% 99% 93%  Weight:      Height:        General: Pt is alert, awake, not in acute distress Cardiovascular: RRR, S1/S2 +, no rubs, no gallops Respiratory: CTA bilaterally, no wheezing, no rhonchi Abdominal: Soft, NT, ND, bowel sounds + Extremities: no edema, no cyanosis   The results of significant diagnostics from this hospitalization (including imaging, microbiology, ancillary and laboratory) are listed below for reference.     Microbiology: Recent Results (from the past 240 hour(s))  SARS Coronavirus 2 by RT PCR (hospital order, performed in [facility] hospital lab) *cepheid single result test* Anterior Nasal Swab     Status: None   Collection Time: 06/17/22 10:22 AM   Specimen: Anterior Nasal Swab  Result Value Ref Range  Status   SARS Coronavirus 2 by RT PCR NEGATIVE NEGATIVE Final    Comment: (NOTE) SARS-CoV-2 target nucleic acids are NOT DETECTED.  The SARS-CoV-2 RNA is generally detectable in upper and lower respiratory specimens during the acute phase of infection. The lowest concentration of SARS-CoV-2 viral copies this assay can detect is 250 copies / mL. A negative result does not preclude SARS-CoV-2 infection and should not be used as the sole basis for treatment or other patient management decisions.  A negative result may occur with improper specimen collection / handling, submission of specimen other than nasopharyngeal swab, presence of viral mutation(s) within the areas targeted by this assay, and inadequate number of viral copies (<250 copies / mL). A negative result must be combined with clinical observations, patient history, and epidemiological information.  Fact Sheet for Patients:   https://www.patel.info/  Fact Sheet for Healthcare Providers:  https://hall.com/  This test is not yet approved or  cleared by the [place] and has been authorized for detection and/or diagnosis of SARS-CoV-2 by FDA under an Emergency Use Authorization (EUA).  This EUA will remain in effect (meaning this test can be used) for the duration of the COVID-19 declaration under Section 564(b)(1) of the Act, 21 U.S.C. section 360bbb-3(b)(1), unless the authorization is terminated or revoked sooner.  Performed at [facility], [address], [place], [place] [address]   Urine Culture     Status: Abnormal   Collection Time: 06/17/22 11:40 AM   Specimen: Urine, Clean Catch  Result Value Ref Range Status   Specimen Description   Final    URINE, CLEAN CATCH Performed at [facility], [address] [place], [place] [address]    Special Requests   Final    Normal Performed at [facility], [address], [place], [place] [address]    Culture (A)  Final     20,000 COLONIES/mL GROUP B STREP(S.AGALACTIAE)ISOLATED TESTING AGAINST S. AGALACTIAE NOT ROUTINELY PERFORMED DUE TO PREDICTABILITY OF AMP/PEN/VAN SUSCEPTIBILITY. Performed at [facility], [place] [address] [place], [place] [address]    Report Status 06/19/2022 FINAL  Final  Culture, blood (Routine X 2) w Reflex to ID Panel     Status: None (Preliminary result)   Collection Time: 06/18/22  8:31 AM   Specimen: Left Antecubital; Blood  Result Value Ref Range Status   Specimen Description   Final    LEFT ANTECUBITAL BOTTLES DRAWN AEROBIC AND ANAEROBIC   Special Requests   Final    Blood Culture results may not be optimal due to an excessive volume of blood received in culture bottles   Culture   Final    NO GROWTH < 24 HOURS Performed at [facility], [address], [place], [place] [address]    Report Status PENDING  Incomplete  Culture, blood (Routine X 2) w Reflex to ID Panel     Status: None (Preliminary result)   Collection Time: 06/18/22  8:36 AM   Specimen: Right Antecubital; Blood  Result Value Ref Range Status   Specimen Description   Final    RIGHT ANTECUBITAL BOTTLES DRAWN AEROBIC AND ANAEROBIC   Special Requests   Final    Blood Culture results may not be optimal due to an excessive volume of blood received in culture bottles   Culture   Final    NO GROWTH < 24 HOURS Performed at [facility], [address], [place], [place] [address]    Report Status PENDING  Incomplete  MRSA Next Gen by PCR, Nasal     Status: None   Collection Time: 06/19/22 12:55 AM   Specimen: Nasal Mucosa; Nasal Swab  Result Value Ref Range Status   MRSA by PCR Next Gen NOT DETECTED NOT DETECTED Final    Comment: (NOTE) The GeneXpert MRSA Assay (FDA approved for NASAL specimens only), is one component of a comprehensive MRSA colonization surveillance program. It is not intended to diagnose MRSA infection nor to guide or monitor treatment for MRSA infections. Test performance is not FDA  approved in patients less than 9 years old. Performed at [facility], [address] [place], [place] [address]      Labs: BNP (last 3 results) No results for input(s): "BNP" in the last 8760 hours. Basic Metabolic Panel: Recent Labs  Lab 06/17/22 1013 06/18/22 0514 06/19/22 0408  NA 132* 135 135  K 5.5* 3.9 3.4*  CL 96* 103 102  CO2 '25 25 25  '$ GLUCOSE 310* 131* 184*  BUN 30* 26* 28*  CREATININE 2.00* 1.79* 1.98*  CALCIUM 9.7 9.2 8.8*  MG  --  2.1 2.0   Liver Function Tests: Recent Labs  Lab 06/17/22 1013  AST 12*  ALT 10  ALKPHOS 86  BILITOT 0.4  PROT 8.4*  ALBUMIN 3.7   No results for input(s): "LIPASE", "AMYLASE" in the last 168 hours. No results for input(s): "AMMONIA" in the last 168 hours. CBC: Recent Labs  Lab 06/17/22 1013 06/18/22 0514 06/19/22 0408  WBC 16.2* 12.1* 10.5  NEUTROABS 13.3*  --   --   HGB 11.9* 10.6* 9.8*  HCT 35.8* 32.2* 30.3*  MCV 86.1 86.6 87.6  PLT 276 265 273   Cardiac Enzymes: No results for input(s): "CKTOTAL", "CKMB", "CKMBINDEX", "TROPONINI" in the last 168 hours. BNP: Invalid input(s): "POCBNP" CBG: Recent Labs  Lab 06/18/22 2025 06/19/22 0037 06/19/22 0407 06/19/22 0720 06/19/22 1145  GLUCAP 283* 216* 177* 181* 267*   D-Dimer No results for input(s): "DDIMER" in the last 72 hours. Hgb A1c Recent Labs    06/17/22 1652  HGBA1C 10.2*   Lipid Profile No results for input(s): "CHOL", "HDL", "LDLCALC", "TRIG", "CHOLHDL", "LDLDIRECT" in the last 72 hours. Thyroid function studies No results for input(s): "TSH", "T4TOTAL", "T3FREE", "THYROIDAB" in the last 72 hours. Anemia work up No results for input(s): "VITAMINB12", "FOLATE", "FERRITIN", "TIBC", "IRON", "RETICCTPCT" in the last 72 hours. Urinalysis    Component Value Date/Time   COLORURINE YELLOW 06/17/2022 1140   APPEARANCEUR TURBID (A) 06/17/2022 1140   APPEARANCEUR Cloudy (A) 03/22/2022 1610   LABSPEC 1.013 06/17/2022 1140   PHURINE 7.0 06/17/2022  1140   GLUCOSEU >=500 (A) 06/17/2022 1140   HGBUR SMALL (A) 06/17/2022 1140   BILIRUBINUR NEGATIVE 06/17/2022 1140   BILIRUBINUR Negative 03/22/2022 1610   KETONESUR NEGATIVE 06/17/2022 1140   PROTEINUR 100 (A) 06/17/2022 1140   UROBILINOGEN negative 12/18/2014 1601   UROBILINOGEN 1.0 04/06/2010 0509   NITRITE NEGATIVE 06/17/2022 1140   LEUKOCYTESUR LARGE (A) 06/17/2022 1140   Sepsis Labs Recent Labs  Lab 06/17/22 1013 06/18/22 0514 06/19/22 0408  WBC 16.2* 12.1* 10.5    Time coordinating discharge: 36mn  SIGNED:  JMissy Sabins OMS IV  [facility] 06/19/2022, 1:32 PM How to contact the TNovamed [facility] Premier Surgery CenterAttending or Consulting provider 7[place] covering provider during after hours 7Leonardo for this patient?  Check the care team in [facility] Centerand look for a) attending/consulting TRH provider listed and b) the TEnd[facility] Center Of [facility] LPteam listed Log into [email or web address] and use [place]'s universal password to access. If you do not have the password, please contact the hospital operator. Locate the TLegacy Meridian Park Medical Centerprovider you are looking for under [facility] and page to a number that you can be directly reached. If you still have difficulty reaching the provider, please page the DSt. [facility](Director on Call) for the Hospitalists listed on amion for assistance.

## 2022-06-20 ENCOUNTER — Telehealth: Payer: Self-pay | Admitting: *Deleted

## 2022-06-20 ENCOUNTER — Encounter: Payer: Self-pay | Admitting: *Deleted

## 2022-06-20 NOTE — Patient Outreach (Signed)
Care Coordination [facility] Note Transition Care Management Follow-up Telephone Call Date of discharge and from where: Monday, 12/04/23[name]; acute metabolic encephalopathy; UTI How have you been since you were released from the hospital? Per patient: "I am doing okay, my wife is taking good care of me like she always does; please talk to her;" noted spouse Jacob Fowler is not included on [place]- received verbal consent from patient today to speak with spouse and entirety of TOC call completed with both patient and spouse while phone on speaker mode; per spouse Jacob Fowler: "he is doing okay, seems to be much better after the hospital visit; I have no concerns and my daughter and I are making sure he has everything he needs.  I need to be on his release form- I will do as you have advised and get an updated form signed when I take him to the doctor's office; I will make that appointment myself as soon as they re-open after their lunch break today." Any questions or concerns? Yes- spouse concerned that she is not included on last verifiable [facility] DPR form dated/ uploaded on 10/08/20: provided education and explained process to have updated DPR form uploaded to EHR  Items Reviewed: Did the pt receive and understand the discharge instructions provided? Yes  Medications obtained and verified? Yes  verified no changes made to medications post-hospital discharge Other? Yes - noted recent referral for CCM services- prior calls made by CCM team were all unsuccessful- will notify PCP and CCM care team as FYI for re-scheduling efforts Any new allergies since your discharge? No  Dietary orders reviewed? Yes Do you have support at home? Yes  spouse and daughter provide all care needs for patient; spouse reports patient is essentially bed bound due to [place] and Equipment/Supplies: Were home health services ordered? no If so, what is the name of the agency? N/A  Has the agency set up a time to come to the  patient's home? not applicable Were any new equipment or medical supplies ordered?  No What is the name of the medical supply agency? N/A Were you able to get the supplies/equipment? not applicable Do you have any questions related to the use of the equipment or supplies? No N/A  Functional Questionnaire: (I = Independent and D = Dependent) ADLs: D  spouse and daughter provide all care needs for patient  Bathing/Dressing- D  spouse and daughter provide all care needs for patient  Meal Prep- D spouse and daughter provide all care needs for patient  Eating- I  Maintaining continence- D  spouse and daughter provide all care needs for patient  Transferring/Ambulation- D  spouse and daughter provide all care needs for patient  Managing Meds- D  spouse and daughter provide all care needs for patient  Follow up appointments reviewed:  PCP Hospital f/u appt confirmed? No  Scheduled to see - on - @ - spouse declines assistance in scheduling post-hospital visit PCP appointment- states she prefers to schedule herself; provided my direct phone number should she need assistance after she takes efforts to complete independently per her stated request [facility] f/u appt confirmed? No  Scheduled to see - on - @ - Are transportation arrangements needed? No  If their condition worsens, is the pt aware to call PCP or go to the Emergency Dept.? Yes Was the patient provided with contact information for the PCP's office or ED? No- declines, reports already has care provider contact information Was to pt encouraged  to call back with questions or concerns? Yes- provided my direct phone number for any questions or concerns that arise in the future  SDOH assessments and interventions completed:   Yes SDOH Interventions Today    Flowsheet Row Most Recent Value  SDOH Interventions   Food Insecurity Interventions Intervention Not Indicated  Transportation Interventions Intervention Not Indicated   [Family provides transportation]      Care Coordination Interventions:  Provided education around purpose of/ need for updated DPR to include spouse/ daughter and explained process to have DPR form updated; provided education around CCM program and placed care coordination outreach to PCP and CCM team as FYI; provided education around signs/ symptoms UTI along with corresponding action plan and need for prompt post-hospital lab work and PCP office visit     Encounter Outcome:  Pt. Visit Completed    Oneta Rack, RN, BSN, CCRN Alumnus RN CM Care Coordination/ Transition of [place] Management [phone]: direct office

## 2022-06-23 LAB — CULTURE, BLOOD (ROUTINE X 2)
Culture: NO GROWTH
Culture: NO GROWTH

## 2022-06-26 ENCOUNTER — Ambulatory Visit (INDEPENDENT_AMBULATORY_CARE_PROVIDER_SITE_OTHER): Payer: PPO | Admitting: Nurse Practitioner

## 2022-06-26 ENCOUNTER — Encounter: Payer: Self-pay | Admitting: Nurse Practitioner

## 2022-06-26 VITALS — BP 107/71 | HR 90 | Temp 96.7°F | Resp 20

## 2022-06-26 DIAGNOSIS — E1165 Type 2 diabetes mellitus with hyperglycemia: Secondary | ICD-10-CM

## 2022-06-26 DIAGNOSIS — G9341 Metabolic encephalopathy: Secondary | ICD-10-CM | POA: Diagnosis not present

## 2022-06-26 DIAGNOSIS — Z7689 Persons encountering health services in other specified circumstances: Secondary | ICD-10-CM

## 2022-06-26 DIAGNOSIS — N3000 Acute cystitis without hematuria: Secondary | ICD-10-CM | POA: Diagnosis not present

## 2022-06-26 NOTE — Progress Notes (Signed)
Subjective:    [name], male    DOB: 30-Jan-1951, 71 y.o.   MRN: [id number]  Today's visit was for Transitional Care Management.  The patient was discharged from [facility] on 06/19/22 with a primary diagnosis of acute metabolic encephalopathy with UTI.   Contact with the patient and/or caregiver, by a clinical staff member, was made on 06/20/22 and was documented as a telephone encounter within the EMR.  Through chart review and discussion with the patient I have determined that management of their condition is of high complexity.     Patient went to the ED on 06/17/22. He was dx with acute metabolic encephalopathy secondary to UTI. He was treated with IV antibiotic. Was sent home on oral antibiotic. He is much better. No c/o of disorienteation or voiding issues since discharge. Only issue they have right ow is blood sugars are running over 200. They started him on lantus in eh hospital at 14 u daily.    Review of Systems  Constitutional:  Negative for diaphoresis.  Eyes:  Negative for pain.  Respiratory:  Negative for shortness of breath.   Cardiovascular:  Negative for chest pain, palpitations and leg swelling.  Gastrointestinal:  Negative for abdominal pain.  Endocrine: Negative for polydipsia.  Skin:  Negative for rash.  Neurological:  Negative for dizziness, weakness and headaches.  Hematological:  Does not bruise/bleed easily.  All other systems reviewed and are negative.      Objective:   Physical Exam Vitals and nursing note reviewed.  Constitutional:      Appearance: Normal appearance. He is well-developed.  Neck:     Thyroid: No thyroid mass or thyromegaly.     Vascular: No carotid bruit or JVD.     Trachea: Phonation normal.  Cardiovascular:     Rate and Rhythm: Normal rate and regular rhythm.  Pulmonary:     Effort: Pulmonary effort is normal. No respiratory distress.     Breath sounds: Normal breath sounds.  Abdominal:      General: Bowel sounds are normal.     Palpations: Abdomen is soft.     Tenderness: There is no abdominal tenderness.  Musculoskeletal:        General: Normal range of motion.     Cervical back: Normal range of motion and neck supple.  Lymphadenopathy:     Cervical: No cervical adenopathy.  Skin:    General: Skin is warm and dry.  Neurological:     Mental Status: He is alert and oriented to person, place, and time.  Psychiatric:        Behavior: Behavior normal.        Thought Content: Thought content normal.        Judgment: Judgment normal.     BP 107/71   Pulse 90   Temp (!) 96.7 F (35.9 C) (Temporal)   Resp 20   SpO2 93%         Assessment & Plan:   Jacob Fowler in today with chief complaint of Hospitalization Follow-up   1. Encounter for support and coordination of transition of care Hospital records reviewed  2. Acute cystitis without hematuria Force fluids  3. Acute metabolic encephalopathy Report any disorientation  4. Uncontrolled type 2 diabetes mellitus with hyperglycemia, without long-term current use of insulin (HCC) Continue lantus at 14 u daily Continue to keep diary of blood sugars at home. Will recheck in January at follow  up appointment    The above  assessment and management plan was discussed with the patient. The patient verbalized understanding of and has agreed to the management plan. Patient is aware to call the clinic if symptoms persist or worsen. Patient is aware when to return to the clinic for a follow-up visit. Patient educated on when it is appropriate to go to the emergency department.   Mary-Margaret Hassell Done, FNP

## 2022-06-28 ENCOUNTER — Other Ambulatory Visit: Payer: Self-pay | Admitting: Family Medicine

## 2022-06-28 DIAGNOSIS — E1165 Type 2 diabetes mellitus with hyperglycemia: Secondary | ICD-10-CM

## 2022-06-29 ENCOUNTER — Other Ambulatory Visit: Payer: Self-pay | Admitting: Family Medicine

## 2022-06-29 DIAGNOSIS — I152 Hypertension secondary to endocrine disorders: Secondary | ICD-10-CM

## 2022-07-03 ENCOUNTER — Telehealth: Payer: Self-pay | Admitting: Nurse Practitioner

## 2022-07-03 NOTE — Telephone Encounter (Signed)
Will need a telephone visit or evisit

## 2022-07-03 NOTE — Telephone Encounter (Signed)
Patient took an at home test for UTI and it was positive. Said that they were told they did not need an appt if he did a test at home. Would like to speak to nurse about it. Please call back.

## 2022-07-04 ENCOUNTER — Telehealth: Payer: Self-pay | Admitting: Nurse Practitioner

## 2022-07-04 ENCOUNTER — Ambulatory Visit (INDEPENDENT_AMBULATORY_CARE_PROVIDER_SITE_OTHER): Payer: PPO | Admitting: Nurse Practitioner

## 2022-07-04 ENCOUNTER — Encounter: Payer: Self-pay | Admitting: Nurse Practitioner

## 2022-07-04 DIAGNOSIS — N3000 Acute cystitis without hematuria: Secondary | ICD-10-CM

## 2022-07-04 MED ORDER — AMOXICILLIN-POT CLAVULANATE 875-125 MG PO TABS: 1.0000 | ORAL_TABLET | Freq: Two times a day (BID) | ORAL | 0 refills | Status: DC

## 2022-07-04 NOTE — Telephone Encounter (Signed)
Pts wife requesting to schedule televisit with MMM today. MMM is overbooked. Can we schedule pt today or is another day ok?

## 2022-07-04 NOTE — Telephone Encounter (Signed)
Had phone visit today

## 2022-07-04 NOTE — Progress Notes (Signed)
   Virtual Visit  Note Due to COVID-19 pandemic this visit was conducted virtually. This visit type was conducted due to national recommendations for restrictions regarding the COVID-19 Pandemic (e.g. social distancing, sheltering in place) in an effort to limit this patient's exposure and mitigate transmission in our community. All issues noted in this document were discussed and addressed.  A physical exam was not performed with this format.  I connected with Jacob Fowler on 07/04/22 at 9:23 by telephone and verified that I am speaking with the correct person using two identifiers. Jacob Fowler is currently located at home and his wife is currently with him during visit. The provider, Mary-Margaret Hassell Done, FNP is located in their office at time of visit.  I discussed the limitations, risks, security and privacy concerns of performing an evaluation and management service by telephone and the availability of in person appointments. I also discussed with the patient that there may be a patient responsible charge related to this service. The patient expressed understanding and agreed to proceed.   History and Present Illness:  Urinary Tract Infection  This is a new problem. The current episode started yesterday. The problem occurs every urination. The problem has been waxing and waning. The quality of the pain is described as burning. The pain is at a severity of 4/10. The pain is mild. There has been no fever. He is Not sexually active. There is No history of pyelonephritis. Associated symptoms include frequency and urgency. He has tried nothing for the symptoms. The treatment provided mild relief. His past medical history is significant for recurrent UTIs.      Review of Systems  Genitourinary:  Positive for frequency and urgency.     Observations/Objective: Alert and oriented- answers all questions appropriately No distress   Assessment and Plan: Jacob [place]  Fowler in today with chief complaint of Urinary Tract Infection   1. Acute cystitis without hematuria Take medication as prescribe Cotton underwear Take shower not bath Cranberry juice, yogurt Force fluids AZO over the counter X2 days RTO prn Last urine cullture reviewed  Meds ordered this encounter  Medications   amoxicillin-clavulanate (AUGMENTIN) 875-125 MG tablet    Sig: Take 1 tablet by mouth 2 (two) times daily.    Dispense:  20 tablet    Refill:  0    Order Specific Question:   Supervising Provider    Answer:   Caryl Pina A [[id number]]      Follow Up Instructions: prn    I discussed the assessment and treatment plan with the patient. The patient was provided an opportunity to ask questions and all were answered. The patient agreed with the plan and demonstrated an understanding of the instructions.   The patient was advised to call back or seek an in-person evaluation if the symptoms worsen or if the condition fails to improve as anticipated.  The above assessment and management plan was discussed with the patient. The patient verbalized understanding of and has agreed to the management plan. Patient is aware to call the clinic if symptoms persist or worsen. Patient is aware when to return to the clinic for a follow-up visit. Patient educated on when it is appropriate to go to the emergency department.   Time call ended:  9:35  I provided 12 minutes of  non face-to-face time during this encounter.    Mary-Margaret Hassell Done, FNP

## 2022-07-05 NOTE — Telephone Encounter (Signed)
Patient had telephone visit and this was taken care of

## 2022-07-18 ENCOUNTER — Ambulatory Visit: Payer: PPO | Admitting: Nurse Practitioner

## 2022-07-19 ENCOUNTER — Encounter: Payer: Self-pay | Admitting: Nurse Practitioner

## 2022-07-24 DIAGNOSIS — E875 Hyperkalemia: Secondary | ICD-10-CM | POA: Diagnosis not present

## 2022-07-24 DIAGNOSIS — R338 Other retention of urine: Secondary | ICD-10-CM | POA: Diagnosis not present

## 2022-07-24 DIAGNOSIS — E1122 Type 2 diabetes mellitus with diabetic chronic kidney disease: Secondary | ICD-10-CM | POA: Diagnosis not present

## 2022-07-24 DIAGNOSIS — N200 Calculus of kidney: Secondary | ICD-10-CM | POA: Diagnosis not present

## 2022-07-24 DIAGNOSIS — E1129 Type 2 diabetes mellitus with other diabetic kidney complication: Secondary | ICD-10-CM | POA: Diagnosis not present

## 2022-07-24 DIAGNOSIS — N1330 Unspecified hydronephrosis: Secondary | ICD-10-CM | POA: Diagnosis not present

## 2022-07-24 DIAGNOSIS — E211 Secondary hyperparathyroidism, not elsewhere classified: Secondary | ICD-10-CM | POA: Diagnosis not present

## 2022-07-24 DIAGNOSIS — R809 Proteinuria, unspecified: Secondary | ICD-10-CM | POA: Diagnosis not present

## 2022-07-24 DIAGNOSIS — N189 Chronic kidney disease, unspecified: Secondary | ICD-10-CM | POA: Diagnosis not present

## 2022-07-24 DIAGNOSIS — N170 Acute kidney failure with tubular necrosis: Secondary | ICD-10-CM | POA: Diagnosis not present

## 2022-07-26 DIAGNOSIS — N200 Calculus of kidney: Secondary | ICD-10-CM | POA: Diagnosis not present

## 2022-07-26 DIAGNOSIS — E1129 Type 2 diabetes mellitus with other diabetic kidney complication: Secondary | ICD-10-CM | POA: Diagnosis not present

## 2022-07-26 DIAGNOSIS — E211 Secondary hyperparathyroidism, not elsewhere classified: Secondary | ICD-10-CM | POA: Diagnosis not present

## 2022-07-26 DIAGNOSIS — R809 Proteinuria, unspecified: Secondary | ICD-10-CM | POA: Diagnosis not present

## 2022-07-26 DIAGNOSIS — N189 Chronic kidney disease, unspecified: Secondary | ICD-10-CM | POA: Diagnosis not present

## 2022-07-26 DIAGNOSIS — N170 Acute kidney failure with tubular necrosis: Secondary | ICD-10-CM | POA: Diagnosis not present

## 2022-07-26 DIAGNOSIS — E1122 Type 2 diabetes mellitus with diabetic chronic kidney disease: Secondary | ICD-10-CM | POA: Diagnosis not present

## 2022-07-26 DIAGNOSIS — N390 Urinary tract infection, site not specified: Secondary | ICD-10-CM | POA: Diagnosis not present

## 2022-07-26 DIAGNOSIS — E876 Hypokalemia: Secondary | ICD-10-CM | POA: Diagnosis not present

## 2022-07-27 ENCOUNTER — Ambulatory Visit (INDEPENDENT_AMBULATORY_CARE_PROVIDER_SITE_OTHER): Payer: PPO | Admitting: Nurse Practitioner

## 2022-07-27 ENCOUNTER — Encounter: Payer: Self-pay | Admitting: Nurse Practitioner

## 2022-07-27 VITALS — BP 100/69 | HR 88 | Temp 97.9°F | Resp 20

## 2022-07-27 DIAGNOSIS — E785 Hyperlipidemia, unspecified: Secondary | ICD-10-CM

## 2022-07-27 DIAGNOSIS — E559 Vitamin D deficiency, unspecified: Secondary | ICD-10-CM

## 2022-07-27 DIAGNOSIS — I152 Hypertension secondary to endocrine disorders: Secondary | ICD-10-CM | POA: Diagnosis not present

## 2022-07-27 DIAGNOSIS — N401 Enlarged prostate with lower urinary tract symptoms: Secondary | ICD-10-CM

## 2022-07-27 DIAGNOSIS — E039 Hypothyroidism, unspecified: Secondary | ICD-10-CM | POA: Diagnosis not present

## 2022-07-27 DIAGNOSIS — G35 Multiple sclerosis: Secondary | ICD-10-CM | POA: Diagnosis not present

## 2022-07-27 DIAGNOSIS — K581 Irritable bowel syndrome with constipation: Secondary | ICD-10-CM

## 2022-07-27 DIAGNOSIS — K219 Gastro-esophageal reflux disease without esophagitis: Secondary | ICD-10-CM | POA: Diagnosis not present

## 2022-07-27 DIAGNOSIS — E1159 Type 2 diabetes mellitus with other circulatory complications: Secondary | ICD-10-CM

## 2022-07-27 DIAGNOSIS — E1169 Type 2 diabetes mellitus with other specified complication: Secondary | ICD-10-CM

## 2022-07-27 DIAGNOSIS — E1165 Type 2 diabetes mellitus with hyperglycemia: Secondary | ICD-10-CM

## 2022-07-27 DIAGNOSIS — D631 Anemia in chronic kidney disease: Secondary | ICD-10-CM

## 2022-07-27 DIAGNOSIS — N138 Other obstructive and reflux uropathy: Secondary | ICD-10-CM

## 2022-07-27 DIAGNOSIS — N184 Chronic kidney disease, stage 4 (severe): Secondary | ICD-10-CM | POA: Diagnosis not present

## 2022-07-27 DIAGNOSIS — R261 Paralytic gait: Secondary | ICD-10-CM

## 2022-07-27 DIAGNOSIS — I700 Atherosclerosis of aorta: Secondary | ICD-10-CM

## 2022-07-27 LAB — BAYER DCA HB A1C WAIVED: HB A1C (BAYER DCA - WAIVED): 9.8 % — ABNORMAL HIGH (ref 4.8–5.6)

## 2022-07-27 MED ORDER — FERROUS SULFATE 325 (65 FE) MG PO TABS: 325.0000 mg | ORAL_TABLET | Freq: Every day | ORAL | 1 refills | Status: DC

## 2022-07-27 MED ORDER — CALMOSEPTINE 0.44-20.6 % EX OINT: TOPICAL_OINTMENT | CUTANEOUS | 2 refills | Status: DC

## 2022-07-27 MED ORDER — INSULIN GLARGINE 100 UNITS/ML SOLOSTAR PEN: 20.0000 [IU] | PEN_INJECTOR | Freq: Every day | SUBCUTANEOUS | 11 refills | Status: DC

## 2022-07-27 MED ORDER — OMEPRAZOLE 20 MG PO CPDR: 20.0000 mg | DELAYED_RELEASE_CAPSULE | Freq: Every day | ORAL | 1 refills | Status: DC

## 2022-07-27 MED ORDER — LEVOTHYROXINE SODIUM 75 MCG PO TABS: ORAL_TABLET | ORAL | 1 refills | Status: DC

## 2022-07-27 MED ORDER — FINASTERIDE 5 MG PO TABS: 5.0000 mg | ORAL_TABLET | Freq: Every day | ORAL | 3 refills | Status: DC

## 2022-07-27 MED ORDER — ISOSORBIDE MONONITRATE ER 60 MG PO TB24: ORAL_TABLET | ORAL | 1 refills | Status: DC

## 2022-07-27 MED ORDER — LANTUS SOLOSTAR 100 UNIT/ML ~~LOC~~ SOPN: 20.0000 [IU] | PEN_INJECTOR | Freq: Every day | SUBCUTANEOUS | 99 refills | Status: DC

## 2022-07-27 MED ORDER — ROSUVASTATIN CALCIUM 10 MG PO TABS: 10.0000 mg | ORAL_TABLET | Freq: Every day | ORAL | 1 refills | Status: DC

## 2022-07-27 NOTE — Addendum Note (Signed)
Addended by: Chevis Pretty on: 07/27/2022 04:32 PM   Modules accepted: Orders

## 2022-07-27 NOTE — Progress Notes (Signed)
Subjective:    [name], male    DOB: [birth year], 72 y.o.   MRN: [id number]   Chief Complaint: medical management of chronic issues     HPI:  Jacob Fowler is a 72 y.o. who identifies as a male who was assigned male at birth.   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 Hypertension associated with type 2 diabetes mellitus ([place]) No c/o chest pain, sob or headache. Does not check blood pressure at home. BP Readings from Last 3 Encounters:  06/26/22 107/71  06/19/22 125/78  04/17/22 (!) 140/81     2. Aortic atherosclerosis ([place]) Has not seen cardiology in awhile  3. Gastroesophageal reflux disease, unspecified whether esophagitis present Is on omeprazole daily and is doing well.  4. Irritable bowel syndrome with constipation Noo contipation or diarrhea  5. Hyperlipidemia associated with type 2 diabetes mellitus ([place]) Eats what ever his wife and daughter give heim to eat. He doe sno dedictaed exercise due to his MS.  6. Acquired hypothyroidism No issues that they are aware of. Lab Results  Component Value Date   TSH 4.240 11/30/2021     7. Uncontrolled type 2 diabetes mellitus with hyperglycemia, without long-term current use of insulin (HCC) Fasting blood sugars are running 200-250. No low blood suars. Lab Results  Component Value Date   HGBA1C 10.2 (H) 06/17/2022     8. Multiple sclerosis ([place]) Is in wheel chair most of the time  9. BPH with obstruction/lower urinary tract symptoms Last saw urology on 03/22/22. No change to plan of care  10. Vitamin D deficiency Is on a daily vitamin d suppleement  16. Spastic gait Unable to walk now due to weaknees in hi slegs.  12. Chronic kidney disease Sees Dr. Basilia Jumbo on 07/26/22. Just monitoring creratine for now  13. Anemia Neurologist wanted to start him on  iron supplement daily Lab Results  Component Value Date   HGB 9.8 (L) 06/19/2022       New complaints: None today  Allergies  Allergen Reactions   Farxiga [Dapagliflozin] Other (See Comments)    unable to take [place] due to recurrent UTIs, urinary retention and hydronephrosis    Carrington Clamp [Finerenone] Other (See Comments)    hyperkalemia   Outpatient Encounter Medications as of 07/27/2022  Medication Sig   amoxicillin-clavulanate (AUGMENTIN) 875-125 MG tablet Take 1 tablet by mouth 2 (two) times daily.   aspirin 81 MG EC tablet Take 81 mg by mouth daily.   Cholecalciferol (VITAMIN D) 50 MCG (2000 UT) tablet Take 2,000 Units by mouth daily.   cyanocobalamin (VITAMIN B12) 1000 MCG/ML injection INJECT 1ML INTO THE SKIN ONCE A MONTH   ferrous sulfate 325 (65 FE) MG tablet Take 325 mg by mouth daily with breakfast.   finasteride (PROSCAR) 5 MG tablet Take 1 tablet (5 mg total) by mouth daily.   glucose blood test strip Use as instructed   insulin glargine (LANTUS) 100 unit/mL SOPN Inject 14 Units into the skin daily.   Insulin Pen Needle 31G X 5 MM MISC Use with lantus pen as directed   isosorbide mononitrate (IMDUR) 60 MG 24 hr tablet TAKE 1/2 TABLET ('30MG'$  TOTAL) BY MOUTH DAILY   levothyroxine (SYNTHROID) 75 MCG tablet TAKE ONE TABLET (75MCG TOTAL) BY MOUTH DAILY BEFORE BREAKFAST   omeprazole (PRILOSEC) 20 MG capsule Take 1 capsule (  20 mg total) by mouth daily.   OZEMPIC, 2 MG/DOSE, 8 MG/3ML SOPN INJECT 2 MG AS DIRECTED ONCE A WEEK   rosuvastatin (CRESTOR) 10 MG tablet Take 1 tablet (10 mg total) by mouth daily.   silodosin (RAPAFLO) 8 MG CAPS capsule Take 1 capsule (8 mg total) by mouth at bedtime.   sodium bicarbonate 650 MG tablet Take 650 mg by mouth 4 (four) times daily.   [DISCONTINUED] atorvastatin (LIPITOR) 10 MG tablet Take 10 mg by mouth daily.   [DISCONTINUED] niacin-simvastatin (SIMCOR) 500-20 MG 24 hr tablet Take 1 tablet by mouth at bedtime.     No  facility-administered encounter medications on file as of 07/27/2022.    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 / NORMAL LVF   CYSTOSCOPY W/ RETROGRADES  10/30/2011   Procedure: CYSTOSCOPY WITH RETROGRADE PYELOGRAM;  Surgeon: Claybon Jabs, MD;  Location: [facility];  Service: Urology;  Laterality: Right;   CYSTOSCOPY WITH LITHOLAPAXY  10/30/2011   Procedure: CYSTOSCOPY WITH LITHOLAPAXY;  Surgeon: Claybon Jabs, MD;  Location[facility];  Service: Urology;  Laterality: N/A;   CYSTOSCOPY WITH RETROGRADE PYELOGRAM, URETEROSCOPY AND STENT PLACEMENT Left 11/25/2020   Procedure: CYSTOSCOPY WITH LEFT RETROGRADE PYELOGRAM, LEFT URETEROSCOPY WITH LASER AND LEFT URETERAL STENT PLACEMENT;  Surgeon: Cleon Gustin, MD;  Location: [facility];  Service: Urology;  Laterality: Left;   EXTRACORPOREAL SHOCK WAVE LITHOTRIPSY  09-05-2010   RIGHT   EYE SURGERY Right 08/30/2018   HOLMIUM LASER APPLICATION Left 0/16/0109   Procedure: HOLMIUM LASER APPLICATION;  Surgeon: Cleon Gustin, MD;  Location: [facility];  Service: Urology;  Laterality: Left;   NESBIT PROCEDURE  10-17-2004   CORRECTION OF PENILE ANGULATION (PEYRONIES DISEASE)    Family History  Problem Relation Age of Onset   Stroke Mother    Heart attack Mother    Diabetes Mother    Hypertension Mother    Renal cancer Father    Diabetes Sister    Heart attack Brother    Diabetes Son    Heart attack Maternal Grandfather    Heart attack Brother    Bladder Cancer Brother       Controlled substance contract: n/a     Review of Systems  Constitutional:  Negative for diaphoresis.  Eyes:  Negative for pain.  Respiratory:  Negative for shortness of breath.   Cardiovascular:  Negative for chest pain, palpitations and leg swelling.  Gastrointestinal:  Negative for abdominal pain.  Endocrine:  Negative for polydipsia.  Skin:  Negative for rash.  Neurological:  Negative for dizziness, weakness and headaches.  Hematological:  Does not bruise/bleed easily.  All other systems reviewed and are negative.      Objective:   Physical Exam Vitals and nursing note reviewed.  Constitutional:      Appearance: Normal appearance. He is well-developed.  HENT:     Head: Normocephalic.     Nose: Nose normal.     Mouth/Throat:     Mouth: Mucous membranes are moist.     Pharynx: Oropharynx is clear.  Eyes:     Pupils: Pupils are equal, round, and reactive to light.  Neck:     Thyroid: No thyroid mass or thyromegaly.     Vascular: No carotid bruit or JVD.     Trachea: Phonation normal.  Cardiovascular:     Rate and Rhythm:  Normal rate and regular rhythm.  Pulmonary:     Effort: Pulmonary effort is normal. No respiratory distress.     Breath sounds: Normal breath sounds.  Abdominal:     General: Bowel sounds are normal.     Palpations: Abdomen is soft.     Tenderness: There is no abdominal tenderness.  Musculoskeletal:        General: Normal range of motion.     Cervical back: Normal range of motion and neck supple.     Comments: Sitting in wheel chair  Lymphadenopathy:     Cervical: No cervical adenopathy.  Skin:    General: Skin is warm and dry.  Neurological:     Mental Status: He is alert and oriented to person, place, and time.  Psychiatric:        Behavior: Behavior normal.        Thought Content: Thought content normal.        Judgment: Judgment normal.     BP 100/69   Pulse 88   Temp 97.9 F (36.6 C) (Temporal)   Resp 20   SpO2 99%   Hgba1c 9.8%      Assessment & Plan:   Zandon Talton [place] comes in today with chief complaint of No chief complaint on file.   Diagnosis and orders addressed:  1. Hypertension associated with type 2 diabetes mellitus (HCC) Low sodium diet - CBC with Differential/Platelet - CMP14+EGFR - isosorbide mononitrate  (IMDUR) 60 MG 24 hr tablet; TAKE 1/2 TABLET ('30MG'$  TOTAL) BY MOUTH DAILY  Dispense: 45 tablet; Refill: 1  2. Aortic atherosclerosis ([place])  3. Gastroesophageal reflux disease, unspecified whether esophagitis present Avoid spicy foods Do not eat 2 hours prior to bedtime  - omeprazole (PRILOSEC) 20 MG capsule; Take 1 capsule (20 mg total) by mouth daily.  Dispense: 90 capsule; Refill: 1  4. Irritable bowel syndrome with constipation Avoid foods that cause flare up  5. Hyperlipidemia associated with type 2 diabetes mellitus (HCC) Low fat diet - Lipid panel - rosuvastatin (CRESTOR) 10 MG tablet; Take 1 tablet (10 mg total) by mouth daily.  Dispense: 90 tablet; Refill: 1  6. Acquired hypothyroidism - levothyroxine (SYNTHROID) 75 MCG tablet; TAKE ONE TABLET (75MCG TOTAL) BY MOUTH DAILY BEFORE BREAKFAST  Dispense: 90 tablet; Refill: 1   7. Uncontrolled type 2 diabetes mellitus with hyperglycemia, without long-term current use of insulin (HCC) Increased lantus to 20u daily over a four day period - Bayer DCA Hb A1c Waived - insulin glargine (LANTUS) 100 unit/mL SOPN; Inject 20 Units into the skin daily.  Dispense: 15 mL; Refill: 11 - rosuvastatin (CRESTOR) 10 MG tablet; Take 1 tablet (10 mg total) by mouth daily.  Dispense: 90 tablet; Refill: 1  8. Multiple sclerosis ([place]) Keep follow up with neurology  9. BPH with obstruction/lower urinary tract symptoms - finasteride (PROSCAR) 5 MG tablet; Take 1 tablet (5 mg total) by mouth daily.  Dispense: 90 tablet; Refill: 3  10. Vitamin D deficiency Continue daily vitamin d supplement  11. Spastic gait  12. Chronic kidney disease, stage 4 (severe) ([place]) Labs pending  13. Anemia due to stage 4 chronic kidney disease (HCC) - ferrous sulfate 325 (65 FE) MG tablet; Take 1 tablet (325 mg total) by mouth daily with breakfast.  Dispense: 90 tablet; Refill: 1   Labs pending Health Maintenance reviewed Diet and exercise encouraged  Follow up  plan: 3 months   Mary-Margaret Hassell Done, FNP

## 2022-07-27 NOTE — Addendum Note (Signed)
Addended by: Chevis Pretty on: 07/27/2022 04:42 PM   Modules accepted: Orders

## 2022-07-27 NOTE — Patient Instructions (Signed)

## 2022-07-28 LAB — CBC WITH DIFFERENTIAL/PLATELET
Basophils Absolute: 0 10*3/uL (ref 0.0–0.2)
Basos: 0 %
EOS (ABSOLUTE): 0.2 10*3/uL (ref 0.0–0.4)
Eos: 2 %
Hematocrit: 35.1 % — ABNORMAL LOW (ref 37.5–51.0)
Hemoglobin: 11 g/dL — ABNORMAL LOW (ref 13.0–17.7)
Immature Grans (Abs): 0.1 10*3/uL (ref 0.0–0.1)
Immature Granulocytes: 1 %
Lymphocytes Absolute: 1.9 10*3/uL (ref 0.7–3.1)
Lymphs: 15 %
MCH: 27.2 pg (ref 26.6–33.0)
MCHC: 31.3 g/dL — ABNORMAL LOW (ref 31.5–35.7)
MCV: 87 fL (ref 79–97)
Monocytes Absolute: 0.9 10*3/uL (ref 0.1–0.9)
Monocytes: 7 %
Neutrophils Absolute: 9.3 10*3/uL — ABNORMAL HIGH (ref 1.4–7.0)
Neutrophils: 75 %
Platelets: 272 10*3/uL (ref 150–450)
RBC: 4.04 x10E6/uL — ABNORMAL LOW (ref 4.14–5.80)
RDW: 15.1 % (ref 11.6–15.4)
WBC: 12.5 10*3/uL — ABNORMAL HIGH (ref 3.4–10.8)

## 2022-07-28 LAB — CMP14+EGFR
ALT: 7 IU/L (ref 0–44)
AST: 11 IU/L (ref 0–40)
Albumin/Globulin Ratio: 1.2 (ref 1.2–2.2)
Albumin: 3.7 g/dL — ABNORMAL LOW (ref 3.8–4.8)
Alkaline Phosphatase: 107 IU/L (ref 44–121)
BUN/Creatinine Ratio: 12 (ref 10–24)
BUN: 29 mg/dL — ABNORMAL HIGH (ref 8–27)
Bilirubin Total: 0.2 mg/dL (ref 0.0–1.2)
CO2: 23 mmol/L (ref 20–29)
Calcium: 9.4 mg/dL (ref 8.6–10.2)
Chloride: 97 mmol/L (ref 96–106)
Creatinine, Ser: 2.37 mg/dL — ABNORMAL HIGH (ref 0.76–1.27)
Globulin, Total: 3.2 g/dL (ref 1.5–4.5)
Glucose: 362 mg/dL — ABNORMAL HIGH (ref 70–99)
Potassium: 4.3 mmol/L (ref 3.5–5.2)
Sodium: 134 mmol/L (ref 134–144)
Total Protein: 6.9 g/dL (ref 6.0–8.5)
eGFR: 28 mL/min/{1.73_m2} — ABNORMAL LOW (ref >59)

## 2022-07-28 LAB — LIPID PANEL
Chol/HDL Ratio: 4.6 ratio (ref 0.0–5.0)
Cholesterol, Total: 102 mg/dL (ref 100–199)
HDL: 22 mg/dL — ABNORMAL LOW (ref >39)
LDL Chol Calc (NIH): 47 mg/dL (ref 0–99)
Triglycerides: 199 mg/dL — ABNORMAL HIGH (ref 0–149)
VLDL Cholesterol Cal: 33 mg/dL (ref 5–40)

## 2022-08-09 ENCOUNTER — Telehealth: Payer: Self-pay | Admitting: Nurse Practitioner

## 2022-08-09 DIAGNOSIS — Z87440 Personal history of urinary (tract) infections: Secondary | ICD-10-CM

## 2022-08-09 NOTE — Telephone Encounter (Signed)
Pt was recently treated for UTI. Pt is on his last day of antibiotic and daughter called stating that they bought pt a home test to recheck his urine to make sure the UTI had went away but test is showing that pt still has UTI. Needs advise from MMM on what next steps are.

## 2022-08-10 NOTE — Telephone Encounter (Signed)
I need urine sample

## 2022-08-10 NOTE — Telephone Encounter (Signed)
Daughter will bring urine sample.

## 2022-08-11 ENCOUNTER — Other Ambulatory Visit: Payer: PPO

## 2022-08-11 DIAGNOSIS — Z87440 Personal history of urinary (tract) infections: Secondary | ICD-10-CM

## 2022-08-11 LAB — URINALYSIS, COMPLETE
Bilirubin, UA: NEGATIVE
Ketones, UA: NEGATIVE
Nitrite, UA: NEGATIVE
Specific Gravity, UA: 1.01 (ref 1.005–1.030)
Urobilinogen, Ur: 0.2 mg/dL (ref 0.2–1.0)
pH, UA: 6 (ref 5.0–7.5)

## 2022-08-11 LAB — MICROSCOPIC EXAMINATION
Epithelial Cells (non renal): NONE SEEN /hpf (ref 0–10)
Renal Epithel, UA: NONE SEEN /hpf
WBC, UA: >30 /hpf — AB (ref 0–5)

## 2022-08-11 MED ORDER — DOXYCYCLINE HYCLATE 100 MG PO TABS: 100.0000 mg | ORAL_TABLET | Freq: Two times a day (BID) | ORAL | 0 refills | Status: DC

## 2022-08-15 LAB — URINE CULTURE

## 2022-08-16 ENCOUNTER — Encounter: Payer: Self-pay | Admitting: Neurology

## 2022-08-16 ENCOUNTER — Ambulatory Visit: Payer: PPO | Admitting: Neurology

## 2022-08-16 VITALS — BP 131/87 | HR 103

## 2022-08-16 DIAGNOSIS — N390 Urinary tract infection, site not specified: Secondary | ICD-10-CM

## 2022-08-16 DIAGNOSIS — G830 Diplegia of upper limbs: Secondary | ICD-10-CM | POA: Diagnosis not present

## 2022-08-16 DIAGNOSIS — R39198 Other difficulties with micturition: Secondary | ICD-10-CM | POA: Diagnosis not present

## 2022-08-16 DIAGNOSIS — G35 Multiple sclerosis: Secondary | ICD-10-CM

## 2022-08-16 NOTE — Progress Notes (Signed)
[facility]  PATIENT: Jacob Fowler DOB: April 10, 1951  REFERRING DOCTOR OR PCP:  Redge Gainer  _________________________________   HISTORICAL  CHIEF COMPLAINT:  Chief Complaint  Patient presents with   Room 10    Pt is here with his Wife and Daughter. Pt's daughter states he hasn't be able to walk for the past 6 months. Pt's wife states that she has stopped giving him his shot because it makes him sick. Pt's daughter states that he has muscle weakness in his legs. Pt's daughter states that everything else is good.     HISTORY OF PRESENT ILLNESS:  Jacob Fowler is a 72 year old man with multiple sclerosis.      Update 08/16/2022 He is on Betaseron.   He tolerates it well.   He is only doing 1 shot a week.  I had a conversation with him and his family that it is probable that at his age and with duration of MS, that he is not getting much benefit from the Betaseron and would not get much benefit from other disease modifying therapies as they are more effective against the inflammatory aspects of MS rather than the secondary progressive degenerative changes.  He has had hospitalization for UTi 06/18/2022 felt due to urinary retention from [place] and prostate.   He is on finasteride.  He has a citrobacter freundii and just started doxycycline.     Since the hospital stay he is weaker and having more trouble moving and is now in wheelchair.   He did PT but had no benefit so family is doing some of the exercises   MRI in 2020 showed no progression compared to 2018.  We discussed stopping DMT vs continung and he opted to continue Betaseron.  He has 2 spinal cord plaques.   He has had 4 stable MRI's brain/cervical spine since 2012   For many years, the main problem has been right greater than left leg weakness and poor gait.  This is all worsened since his UTI and hospitalization with bedrest.  Ampyra and amantadine have not helped his gait.  He has urinary hesitancy  and frequency.   He is on finasteride for his BPH and urinary retention.  He felt he did better when he was also on tamsulosin but that had been discontinued..   Vision is doing well.    He has T2 IDDM,.  He just started the insulin l6 weeks ago.   Sugars are now in 200 range from 300+.  He has CRF with eGFR 28.    He has also gained weight  MS History:    He began to note significant difficulty with gait in January 2010.      He had some back x-rays. He was then referred to neurosurgery that noted that he had plaques in his spine c/w  MS. He had further testing including MRI of the brain and spinal tap. These were all consistent with multiple sclerosis and by November 2010 he had started Betaseron. He feels that he has been mostly stable over the past 5 years but has noted that his gait is a little worse than it was in 2010. About 2013, he started using a cane.  Around 2020 he started using a walker more  IMAGING MRI cervical spine2/19/18 shows T2 hyperintense lesions within the spinal cord to the left adjacent to C4-C5 and to the right adjacent to T1, unchanged in appearance when compared to the previous MRI.     Mild degenerative  changes at C3-C4, C4-C5 and C6-C7 that did not lead to any nerve root compression.    There is a normal enhancement pattern and there are no acute findings.   08/25/2016 MRI of the brain with and without contrast shows   Multiple T2/FLAIR hyperintense foci in the periventricular, juxtacortical and deep white matter of the hemispheres and 2 foci in the brainstem in a pattern and configuration consistent with chronic demyelinating plaque associated with multiple sclerosis. None of the foci appears to be acute. When compared to the MRI dated 09/28/2014, there is no interval change..   Mild to moderate cortical atrophy that has mildly progressed when compared to the previous MRI.Marland Kitchen    There are no acute findings and there is a normal enhancement pattern.  MRI 03/31/2019 brain and  cervical spine shows " T2 hyperintense lesions within the spinal cord to the left adjacent to C4-C5 and to the right adjacent to T1, unchanged in appearance when compared to the previous MRI."   MRI brain showed no change in MS lesions.   REVIEW OF SYSTEMS: Constitutional: No fevers, chills, sweats, or change in appetite.  He notes fatigue most afternoon.  He has occasional insomnia. Eyes: No visual changes, double vision, eye pain Ear, nose and throat: No hearing loss, ear pain, nasal congestion, sore throat Cardiovascular: No chest pain, palpitations Respiratory:  No shortness of breath at rest or with exertion.   No wheezes GastrointestinaI: No nausea, vomiting, diarrhea, abdominal pain, fecal incontinence Genitourinary:  urinary frequency and hesitancy  .  He has nocturia.   Musculoskeletal:  No neck pain, back pain Integumentary: He denies rash or skin lesions. Neurological: as above Psychiatric: No depression at this time.  No anxiety Endocrine: No palpitations, diaphoresis, change in appetite, change in weigh or increased thirst Hematologic/Lymphatic:  No anemia, purpura, petechiae. Allergic/Immunologic: No itchy/runny eyes, nasal congestion, recent allergic reactions, rashes  ALLERGIES: Allergies  Allergen Reactions   Farxiga [Dapagliflozin] Other (See Comments)    unable to take [place] due to recurrent UTIs, urinary retention and hydronephrosis    [place] Clamp [Finerenone] Other (See Comments)    hyperkalemia    HOME MEDICATIONS:  Current Outpatient Medications:    aspirin 81 MG EC tablet, Take 81 mg by mouth daily., Disp: , Rfl:    Cholecalciferol (VITAMIN D) 50 MCG (2000 UT) tablet, Take 2,000 Units by mouth daily., Disp: , Rfl:    cyanocobalamin (VITAMIN B12) 1000 MCG/ML injection, INJECT 1ML INTO THE SKIN ONCE A MONTH, Disp: 3 mL, Rfl: 0   doxycycline (VIBRA-TABS) 100 MG tablet, Take 1 tablet (100 mg total) by mouth 2 (two) times daily. 1 po bid, Disp: 20 tablet, Rfl: 0    ferrous sulfate 325 (65 FE) MG tablet, Take 1 tablet (325 mg total) by mouth daily with breakfast., Disp: 90 tablet, Rfl: 1   finasteride (PROSCAR) 5 MG tablet, Take 1 tablet (5 mg total) by mouth daily., Disp: 90 tablet, Rfl: 3   glucose blood test strip, Use as instructed, Disp: 100 each, Rfl: 12   insulin glargine (LANTUS SOLOSTAR) 100 UNIT/ML Solostar Pen, Inject 20 Units into the skin daily., Disp: 15 mL, Rfl: PRN   insulin glargine (LANTUS) 100 unit/mL SOPN, Inject 20 Units into the skin daily., Disp: 15 mL, Rfl: 11   Insulin Pen Needle 31G X 5 MM MISC, Use with lantus pen as directed, Disp: 100 each, Rfl: 2   isosorbide mononitrate (IMDUR) 60 MG 24 hr tablet, TAKE 1/2 TABLET ('30MG'$  TOTAL) BY MOUTH  DAILY, Disp: 45 tablet, Rfl: 1   levothyroxine (SYNTHROID) 75 MCG tablet, TAKE ONE TABLET (75MCG TOTAL) BY MOUTH DAILY BEFORE BREAKFAST, Disp: 90 tablet, Rfl: 1   Menthol-Zinc Oxide (CALMOSEPTINE) 0.44-20.6 % OINT, Apply small amount o affected are BID, Disp: 113 g, Rfl: 2   omeprazole (PRILOSEC) 20 MG capsule, Take 1 capsule (20 mg total) by mouth daily., Disp: 90 capsule, Rfl: 1   rosuvastatin (CRESTOR) 10 MG tablet, Take 1 tablet (10 mg total) by mouth daily., Disp: 90 tablet, Rfl: 1   sodium bicarbonate 650 MG tablet, Take 650 mg by mouth 4 (four) times daily., Disp: , Rfl:   PAST MEDICAL HISTORY: Past Medical History:  Diagnosis Date   Acquired leg length discrepancy    Bladder stone    BPH associated with nocturia    CAD (coronary artery disease) CARDIOLOGIST- DR HOCHREIN--  LAST VISIT 09-27-2011 IN EPIC   Non obstructive Cath 2010-  HX CORONARY SPASM 2004   Cataract    bilateral    GERD (gastroesophageal reflux disease)    H/O hiatal hernia    Heart murmur    Heartburn    High cholesterol    History of nephrolithiasis 2009   Hx of adenomatous colonic polyps    Hyperlipidemia    Hypertension    Movement disorder    Multiple sclerosis ([place]) DX  10/10---  NEUROLOGIST  DR Delfino Lovett  [facility] ([place])   RIGHT SIDE AFFECTED MORE W/ WEAKNESS- - USES CANE   Nephrolithiasis 08/30/2020   Neuromuscular disorder (HCC)    MS   NIDDM (non-insulin dependent diabetes mellitus)    Peyronie's disease    RBBB    Renal stone RIGHT   Rosacea    Vision abnormalities     PAST SURGICAL HISTORY: 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 / NORMAL LVF   CYSTOSCOPY W/ RETROGRADES  10/30/2011   Procedure: CYSTOSCOPY WITH RETROGRADE PYELOGRAM;  Surgeon: Claybon Jabs, MD;  Location: [place];  Service: Urology;  Laterality: Right;   CYSTOSCOPY WITH LITHOLAPAXY  10/30/2011   Procedure: CYSTOSCOPY WITH LITHOLAPAXY;  Surgeon: Claybon Jabs, MD;  Location: [facility];  Service: Urology;  Laterality: N/A;   CYSTOSCOPY WITH RETROGRADE PYELOGRAM, URETEROSCOPY AND STENT PLACEMENT Left 11/25/2020   Procedure: CYSTOSCOPY WITH LEFT RETROGRADE PYELOGRAM, LEFT URETEROSCOPY WITH LASER AND LEFT URETERAL STENT PLACEMENT;  Surgeon: Cleon Gustin, MD;  Location: [facility];  Service: Urology;  Laterality: Left;   EXTRACORPOREAL SHOCK WAVE LITHOTRIPSY  09/05/2010   RIGHT   EYE SURGERY Right 08/30/2018   HOLMIUM LASER APPLICATION Left 29/93/7169   Procedure: HOLMIUM LASER APPLICATION;  Surgeon: Cleon Gustin, MD;  Location: [facility];  Service: Urology;  Laterality: Left;   NESBIT PROCEDURE  10/17/2004   CORRECTION OF PENILE ANGULATION (PEYRONIES DISEASE)   UTI  06/17/2022    FAMILY HISTORY: Family History  Problem Relation Age of Onset   Stroke Mother    Heart attack Mother    Diabetes Mother    Hypertension Mother    Renal cancer Father    Diabetes Sister    Heart attack Brother    Diabetes Son    Heart attack Maternal Grandfather    Heart attack Brother    Bladder Cancer Brother     SOCIAL HISTORY:  Social History   Socioeconomic  History   Marital status:  Married    Spouse name: Hassan Rowan   Number of children: 2   Years of education: 13   Highest education level: Some college, no degree  Occupational History   Occupation: retired    Comment: self -employed farmer  Tobacco Use   Smoking status: Former    Packs/day: 1.00    Years: 7.00    Total pack years: 7.00    Types: Cigarettes    Quit date: 09/26/1973    Years since quitting: 48.9   Smokeless tobacco: Current    Types: Chew   Tobacco comments:    Chewing tobacco  Vaping Use   Vaping Use: Never used  Substance and Sexual Activity   Alcohol use: No    Alcohol/week: 0.0 standard drinks of alcohol   Drug use: No   Sexual activity: Not on file  Other Topics Concern   Not on file  Social History Narrative   Lives with wife, has MS, mobility issues.   Social Determinants of Health   Financial Resource Strain: Low Risk  (11/18/2020)   Overall Financial Resource Strain (CARDIA)    Difficulty of Paying Living Expenses: Not hard at all  Food Insecurity: No Food Insecurity (06/20/2022)   Hunger Vital Sign    Worried About Running Out of Food in the Last Year: Never true    Ran Out of Food in the Last Year: Never true  Transportation Needs: No Transportation Needs (06/20/2022)   PRAPARE - Hydrologist (Medical): No    Lack of Transportation (Non-Medical): No  Physical Activity: Insufficiently Active (11/18/2020)   Exercise Vital Sign    Days of Exercise per Week: 4 days    Minutes of Exercise per Session: 20 min  Stress: No Stress Concern Present (11/18/2020)   [place]    Feeling of Stress : Not at all  Social Connections: Moderately Integrated (11/18/2020)   Social Connection and Isolation Panel [NHANES]    Frequency of Communication with Friends and Family: More than three times a week    Frequency of Social Gatherings with Friends and Family: More than three  times a week    Attends Religious Services: More than 4 times per year    Active Member of [employer] or Organizations: No    Attends Archivist Meetings: Never    Marital Status: Married  Human resources officer Violence: Not At Risk (11/18/2020)   Humiliation, Afraid, Rape, and Kick questionnaire    Fear of Current or Ex-Partner: No    Emotionally Abused: No    Physically Abused: No    Sexually Abused: No     PHYSICAL EXAM  Vitals:   08/16/22 1554  BP: 131/87  Pulse: (!) 103    There is no height or weight on file to calculate BMI.   General: The patient is well-developed and well-nourished and in no acute distress  Neurologic Exam  Mental status: The patient is alert and oriented x 3 at the time of the examination. The patient has apparent normal recent and remote memory, with an apparently normal attention span and concentration ability.   Speech is normal.  Cranial nerves: Extraocular movements are full.   There is good facial sensation to soft touch bilaterally.Facial strength is normal.  Trapezius and sternocleidomastoid strength is normal. No dysarthria is noted.  The tongue is midline, and the patient has symmetric elevation of the soft palate. No obvious hearing deficits are noted.  Motor:  Muscle bulk is normal.   Increased muscle tone in the legs, right greater than left.  The strength was 2-/5 in the right and 4/5 in the left hip flexures. Strength was 2/5 in the right quad and 4/5 on the left. Distally strength was 2- to 2 in the right leg and 4 in the left leg. Strength was 5/5 in the arms.     Sensory: Sensory testing is intact to touch and vibration sensation in all 4 extremities.  Coordination: Cerebellar testing shows good finger-nose-finger.  He has reduced heel-to-shin on the left and is unable to do on the right  Gait and station: He is currently wheelchair-bound and is unable to walk  Reflexes:  Deep tendon reflexes were normal in the arms.  Deep tendon  reflexes are increased in both legs, right > left     DIAGNOSTIC DATA (LABS, IMAGING, TESTING) - I reviewed patient records, labs, notes, testing and imaging myself where available.  Lab Results  Component Value Date   WBC 12.5 (H) 07/27/2022   HGB 11.0 (L) 07/27/2022   HCT 35.1 (L) 07/27/2022   MCV 87 07/27/2022   PLT 272 07/27/2022      Component Value Date/Time   NA 134 07/27/2022 1605   K 4.3 07/27/2022 1605   CL 97 07/27/2022 1605   CO2 23 07/27/2022 1605   GLUCOSE 362 (H) 07/27/2022 1605   GLUCOSE 184 (H) 06/19/2022 0408   BUN 29 (H) 07/27/2022 1605   CREATININE 2.37 (H) 07/27/2022 1605   CREATININE 1.13 01/06/2013 1311   CALCIUM 9.4 07/27/2022 1605   PROT 6.9 07/27/2022 1605   ALBUMIN 3.7 (L) 07/27/2022 1605   AST 11 07/27/2022 1605   ALT 7 07/27/2022 1605   ALKPHOS 107 07/27/2022 1605   BILITOT 0.2 07/27/2022 1605   GFRNONAA 35 (L) 06/19/2022 0408   GFRNONAA 69 01/06/2013 1311   GFRAA 80 07/28/2020 0811   GFRAA 80 01/06/2013 1311   Lab Results  Component Value Date   CHOL 102 07/27/2022   HDL 22 (L) 07/27/2022   LDLCALC 47 07/27/2022   TRIG 199 (H) 07/27/2022   CHOLHDL 4.6 07/27/2022   Lab Results  Component Value Date   HGBA1C 9.8 (H) 07/27/2022        ASSESSMENT AND PLAN  Multiple sclerosis (HCC)  Urinary dysfunction  Diplegia (HCC)  Urinary tract infection without hematuria, site unspecified   1.   We will d/c DMT in the future assuming MRI's continue to show no new lesions  2.   Continue finasteride.  I suggested family discuss goingback on tamsulosin with urology as he has had 2 UTIs in past 2 months.    (it was d/c by another doctor but I don't know why). 3.   Unfortunately, with his recent infections, he has become a little weaker and currently unable to walk.  Additionally, he has had difficulty transferring although he does have good arm strength.  Family will do exercises with him.   We discussed considering PT as outpatient.  4.   He will return to see [place]  in 6 months or sooner if he has new or worsening neurologic symptoms.   Khaiden Segreto A. Felecia Shelling, MD, PhD 1/61/0960, 4:54 PM Certified in Neurology, Clinical Neurophysiology, Sleep Medicine, Pain Medicine and Neuroimaging  [facility] [address], [place], [place] [address] [phone]

## 2022-08-24 ENCOUNTER — Telehealth: Payer: Self-pay | Admitting: *Deleted

## 2022-08-24 NOTE — Telephone Encounter (Signed)
LVM for pt at [phone] to call office.   We received letter from [facility] patient assistance free drug program asking for prescription to be sent for Betaseron. After speaking with Dr. Felecia Shelling, he states he dicussed with pt at last visit 08/16/22 about stopping medication. We are needing to confirm whether he stopped or not.

## 2022-08-24 NOTE — Telephone Encounter (Signed)
Pt's daughter has called back and confirmed pt has stopped the medication, she also asked that it be noted pt is now on Silodosin '8mg'$ 

## 2022-08-24 NOTE — Telephone Encounter (Signed)
Faxed update to [place] pt assistance that pt has d/c'd Betaseron. Fax: [phone]. Received fax confirmation.

## 2022-08-29 ENCOUNTER — Other Ambulatory Visit: Payer: Self-pay

## 2022-08-29 ENCOUNTER — Telehealth: Payer: Self-pay | Admitting: Nurse Practitioner

## 2022-08-29 DIAGNOSIS — R300 Dysuria: Secondary | ICD-10-CM

## 2022-08-29 NOTE — Telephone Encounter (Signed)
Please review and advise.

## 2022-08-29 NOTE — Telephone Encounter (Signed)
We need to do urine culture

## 2022-08-29 NOTE — Telephone Encounter (Signed)
Daughter advised and verbalized understanding. She will drop a urine by in the AM. Future orders placed.

## 2022-08-30 ENCOUNTER — Other Ambulatory Visit: Payer: Self-pay | Admitting: Nurse Practitioner

## 2022-08-30 ENCOUNTER — Other Ambulatory Visit: Payer: PPO

## 2022-08-30 DIAGNOSIS — R300 Dysuria: Secondary | ICD-10-CM

## 2022-08-30 LAB — URINALYSIS, COMPLETE
Bilirubin, UA: NEGATIVE
Ketones, UA: NEGATIVE
Nitrite, UA: NEGATIVE
Specific Gravity, UA: 1.01 (ref 1.005–1.030)
Urobilinogen, Ur: 0.2 mg/dL (ref 0.2–1.0)
pH, UA: 7 (ref 5.0–7.5)

## 2022-08-30 LAB — MICROSCOPIC EXAMINATION
Renal Epithel, UA: NONE SEEN /hpf
WBC, UA: >30 /hpf — AB (ref 0–5)

## 2022-08-30 MED ORDER — SULFAMETHOXAZOLE-TRIMETHOPRIM 800-160 MG PO TABS: 1.0000 | ORAL_TABLET | Freq: Two times a day (BID) | ORAL | 0 refills | Status: DC

## 2022-09-04 LAB — URINE CULTURE

## 2022-09-20 ENCOUNTER — Ambulatory Visit (INDEPENDENT_AMBULATORY_CARE_PROVIDER_SITE_OTHER): Payer: PPO | Admitting: Urology

## 2022-09-20 VITALS — BP 156/92 | HR 105

## 2022-09-20 DIAGNOSIS — N401 Enlarged prostate with lower urinary tract symptoms: Secondary | ICD-10-CM | POA: Diagnosis not present

## 2022-09-20 DIAGNOSIS — R319 Hematuria, unspecified: Secondary | ICD-10-CM

## 2022-09-20 DIAGNOSIS — N200 Calculus of kidney: Secondary | ICD-10-CM | POA: Diagnosis not present

## 2022-09-20 DIAGNOSIS — R350 Frequency of micturition: Secondary | ICD-10-CM | POA: Diagnosis not present

## 2022-09-20 DIAGNOSIS — R351 Nocturia: Secondary | ICD-10-CM | POA: Diagnosis not present

## 2022-09-20 DIAGNOSIS — N390 Urinary tract infection, site not specified: Secondary | ICD-10-CM | POA: Diagnosis not present

## 2022-09-20 DIAGNOSIS — N138 Other obstructive and reflux uropathy: Secondary | ICD-10-CM

## 2022-09-20 DIAGNOSIS — R3915 Urgency of urination: Secondary | ICD-10-CM

## 2022-09-20 MED ORDER — SILODOSIN 8 MG PO CAPS: 8.0000 mg | ORAL_CAPSULE | Freq: Every day | ORAL | 3 refills | Status: DC

## 2022-09-20 MED ORDER — FINASTERIDE 5 MG PO TABS: 5.0000 mg | ORAL_TABLET | Freq: Every day | ORAL | 3 refills | Status: DC

## 2022-09-20 MED ORDER — NITROFURANTOIN MACROCRYSTAL 50 MG PO CAPS: 50.0000 mg | ORAL_CAPSULE | Freq: Every day | ORAL | 11 refills | Status: DC

## 2022-09-20 MED ORDER — CEPHALEXIN 500 MG PO CAPS: 500.0000 mg | ORAL_CAPSULE | Freq: Two times a day (BID) | ORAL | 0 refills | Status: DC

## 2022-09-20 NOTE — Progress Notes (Signed)
09/20/2022 3:44 PM   Jacob Fowler 1951/04/09 [id number]  Referring provider: Chevis Pretty, [address] [place],  [place] [address]  Followup UTI and BPH   HPI: Mr Jacob Fowler is a 72yo here for followup for UTI and BPH. UA is concerning for infection. He has urinary frequency and urgency. IPSS 12 QOl 3 on rapaflo '8mg'$  daily. Nocturia 2-3x. No dysuria currently.    PMH: Past Medical History:  Diagnosis Date   Acquired leg length discrepancy    Bladder stone    BPH associated with nocturia    CAD (coronary artery disease) CARDIOLOGIST- DR HOCHREIN--  LAST VISIT 09-27-2011 IN EPIC   Non obstructive Cath 2010-  HX CORONARY SPASM 2004   Cataract    bilateral    GERD (gastroesophageal reflux disease)    H/O hiatal hernia    Heart murmur    Heartburn    High cholesterol    History of nephrolithiasis 2009   Hx of adenomatous colonic polyps    Hyperlipidemia    Hypertension    Movement disorder    Multiple sclerosis ([place]) DX  10/10---  NEUROLOGIST  DR Delfino Lovett [facility] ([place])   RIGHT SIDE AFFECTED MORE W/ WEAKNESS- - USES CANE   Nephrolithiasis 08/30/2020   Neuromuscular disorder (HCC)    MS   NIDDM (non-insulin dependent diabetes mellitus)    Peyronie's disease    RBBB    Renal stone RIGHT   Rosacea    Vision abnormalities     Surgical History: 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 / NORMAL LVF   CYSTOSCOPY W/ RETROGRADES  10/30/2011   Procedure: CYSTOSCOPY WITH RETROGRADE PYELOGRAM;  Surgeon: Claybon Jabs, MD;  Location: [facility];  Service: Urology;  Laterality: Right;   CYSTOSCOPY WITH LITHOLAPAXY  10/30/2011   Procedure: CYSTOSCOPY WITH LITHOLAPAXY;  Surgeon: Claybon Jabs, MD;  Location: [facility];  Service: Urology;  Laterality: N/A;   CYSTOSCOPY WITH RETROGRADE PYELOGRAM,  URETEROSCOPY AND STENT PLACEMENT Left 11/25/2020   Procedure: CYSTOSCOPY WITH LEFT RETROGRADE PYELOGRAM, LEFT URETEROSCOPY WITH LASER AND LEFT URETERAL STENT PLACEMENT;  Surgeon: Cleon Gustin, MD;  Location: [facility];  Service: Urology;  Laterality: Left;   EXTRACORPOREAL SHOCK WAVE LITHOTRIPSY  09/05/2010   RIGHT   EYE SURGERY Right 08/30/2018   HOLMIUM LASER APPLICATION Left [id number]   Procedure: HOLMIUM LASER APPLICATION;  Surgeon: Cleon Gustin, MD;  Location: [facility];  Service: Urology;  Laterality: Left;   NESBIT PROCEDURE  10/17/2004   CORRECTION OF PENILE ANGULATION (PEYRONIES DISEASE)   UTI  06/17/2022    Home Medications:  Allergies as of 09/20/2022       Reactions   Farxiga [dapagliflozin] Other (See Comments)   unable to take Wilder Glade due to recurrent UTIs, urinary retention and hydronephrosis   Kerendia [finerenone] Other (See Comments)   hyperkalemia        Medication List        Accurate as of September 20, 2022  3:44 PM. If you have any questions, ask your nurse or doctor.          aspirin EC 81 MG tablet Take 81 mg by mouth daily.   Calmoseptine 0.44-20.6 % Oint Generic drug: Menthol-Zinc Oxide Apply small amount o affected are BID   cyanocobalamin 1000 MCG/ML injection Commonly known as: VITAMIN B12 INJECT 1ML INTO  THE SKIN ONCE A MONTH   doxycycline 100 MG tablet Commonly known as: VIBRA-TABS Take 1 tablet (100 mg total) by mouth 2 (two) times daily. 1 po bid   ferrous sulfate 325 (65 FE) MG tablet Take 1 tablet (325 mg total) by mouth daily with breakfast.   finasteride 5 MG tablet Commonly known as: PROSCAR Take 1 tablet (5 mg total) by mouth daily.   glucose blood test strip Use as instructed   insulin glargine 100 unit/mL Sopn Commonly known as: LANTUS Inject 20 Units into the skin daily.   Lantus SoloStar 100 UNIT/ML Solostar Pen Generic drug: insulin glargine Inject 20 Units into the skin daily.   Insulin Pen Needle 31G X  5 MM Misc Use with lantus pen as directed   isosorbide mononitrate 60 MG 24 hr tablet Commonly known as: IMDUR TAKE 1/2 TABLET ('30MG'$  TOTAL) BY MOUTH DAILY   levothyroxine 75 MCG tablet Commonly known as: SYNTHROID TAKE ONE TABLET (75MCG TOTAL) BY MOUTH DAILY BEFORE BREAKFAST   omeprazole 20 MG capsule Commonly known as: PRILOSEC Take 1 capsule (20 mg total) by mouth daily.   rosuvastatin 10 MG tablet Commonly known as: Crestor Take 1 tablet (10 mg total) by mouth daily.   sodium bicarbonate 650 MG tablet Take 650 mg by mouth 4 (four) times daily.   sulfamethoxazole-trimethoprim 800-160 MG tablet Commonly known as: BACTRIM DS Take 1 tablet by mouth 2 (two) times daily.   Vitamin D 50 MCG (2000 UT) tablet Take 2,000 Units by mouth daily.        Allergies:  Allergies  Allergen Reactions   Farxiga [Dapagliflozin] Other (See Comments)    unable to take Farxiga due to recurrent UTIs, urinary retention and hydronephrosis    Carrington Clamp [Finerenone] Other (See Comments)    hyperkalemia    Family History: Family History  Problem Relation Age of Onset   Stroke Mother    Heart attack Mother    Diabetes Mother    Hypertension Mother    Renal cancer Father    Diabetes Sister    Heart attack Brother    Diabetes Son    Heart attack Maternal Grandfather    Heart attack Brother    Bladder Cancer Brother     Social History:  reports that he quit smoking about 49 years ago. His smoking use included cigarettes. He has a 7.00 pack-year smoking history. His smokeless tobacco use includes chew. He reports that he does not drink alcohol and does not use drugs.  ROS: All other review of systems were reviewed and are negative except what is noted above in HPI  Physical Exam: BP (!) 156/92   Constitutional:  Alert and oriented, No acute distress. HEENT: [place] AT, moist mucus membranes.  Trachea midline, no masses. Cardiovascular: No clubbing, cyanosis, or edema. Respiratory:  Normal respiratory effort, no increased work of breathing. GI: Abdomen is soft, nontender, nondistended, no abdominal masses GU: No CVA tenderness.  Lymph: No cervical or inguinal lymphadenopathy. Skin: No rashes, bruises or suspicious lesions. Neurologic: Grossly intact, no focal deficits, moving all 4 extremities. Psychiatric: Normal mood and affect.  Laboratory Data: Lab Results  Component Value Date   WBC 12.5 (H) 07/27/2022   HGB 11.0 (L) 07/27/2022   HCT 35.1 (L) 07/27/2022   MCV 87 07/27/2022   PLT 272 07/27/2022    Lab Results  Component Value Date   CREATININE 2.37 (H) 07/27/2022    Lab Results  Component Value Date   PSA 5.5 (H)  01/28/2014   PSA 3.37 01/06/2013   PSA 3.08 10/14/2012    No results found for: "TESTOSTERONE"  Lab Results  Component Value Date   HGBA1C 9.8 (H) 07/27/2022    Urinalysis    Component Value Date/Time   COLORURINE YELLOW 06/17/2022 1140   APPEARANCEUR Clear 08/30/2022 0814   LABSPEC 1.013 06/17/2022 1140   PHURINE 7.0 06/17/2022 1140   GLUCOSEU 3+ (A) 08/30/2022 0814   HGBUR SMALL (A) 06/17/2022 1140   BILIRUBINUR Negative 08/30/2022 0814   KETONESUR NEGATIVE 06/17/2022 1140   PROTEINUR 1+ (A) 08/30/2022 0814   PROTEINUR 100 (A) 06/17/2022 1140   UROBILINOGEN negative 12/18/2014 1601   UROBILINOGEN 1.0 04/06/2010 0509   NITRITE Negative 08/30/2022 0814   NITRITE NEGATIVE 06/17/2022 1140   LEUKOCYTESUR 3+ (A) 08/30/2022 0814   LEUKOCYTESUR LARGE (A) 06/17/2022 1140    Lab Results  Component Value Date   LABMICR See below: 08/30/2022   WBCUA >30 (A) 08/30/2022   RBCUA >30 (A) 08/07/2018   LABEPIT 0-10 08/30/2022   MUCUS Present 03/22/2022   BACTERIA Moderate (A) 08/30/2022    Pertinent Imaging:  Results for orders placed during the hospital encounter of 10/30/11  KUB day of procedure  Narrative *RADIOLOGY REPORT*  Clinical Data: Right ureteral stone  ABDOMEN - 1 VIEW  Comparison: [facility] CT  urogram dated 10/17/2011  Findings: Prior distal right ureteral calculus is not definitely visualized on the current radiograph.  Known nonobstructing right lower pole calculi are also not radiographically apparent.  Calcified phleboliths in the pelvis.  Linear calcification overlying the right sacrum corresponds to a vascular calcification.  Suspected radiopaque ingested tablets in the splenic flexure, sigmoid colon (overlying superior bladder), and cecum.  Degenerative changes of the visualized thoracolumbar spine.  IMPRESSION: Prior distal right ureteral calculus is not definitely visualized on the current radiograph.  Known nonobstructing right lower pole calculi are also not radiographically apparent.  Original Report Authenticated By: Julian Hy, M.D.  No results found for this or any previous visit.  No results found for this or any previous visit.  No results found for this or any previous visit.  Results for orders placed during the hospital encounter of 02/01/22  US RENAL  Narrative CLINICAL DATA:  Kidney stone.  EXAM: RENAL / URINARY TRACT ULTRASOUND COMPLETE  COMPARISON:  Renal ultrasound last month 12/16/2021, CT 01/13/2021  FINDINGS: Right Kidney:  Renal measurements: 9.9 x 5.4 x 5.9 cm = volume: 163 mL. Moderate hydronephrosis which was not seen on prior ultrasound. Possible 8 mm nonobstructing stone in the lower kidney, also not seen on prior ultrasound. No evidence of focal renal lesion.  Left Kidney:  Renal measurements: 9.8 x 4.9 x 5.7 cm = volume: 142 mL. Similar degree of mild to moderate hydronephrosis. No visualized renal stone or focal lesion.  Bladder:  Appears normal for degree of bladder distention. Both ureteral jets are demonstrated.  Other:  None.  IMPRESSION: 1. Moderate right hydronephrosis, new from ultrasound last month. Possible nonobstructing right renal calculus. 2. Similar degree of mild to moderate left  hydronephrosis.   Electronically Signed By: Keith Rake M.D. On: 02/02/2022 00:18  No valid procedures specified. No results found for this or any previous visit.  Results for orders placed in visit on 03/22/22  CT RENAL STONE STUDY  Narrative CLINICAL DATA:  Nephrolithiasis. Bilateral hydronephrosis seen on recent ultrasound.  EXAM: CT ABDOMEN AND PELVIS WITHOUT CONTRAST  TECHNIQUE: Multidetector CT imaging of the abdomen and pelvis was performed following  the standard protocol without IV contrast.  RADIATION DOSE REDUCTION: This exam was performed according to the departmental dose-optimization program which includes automated exposure control, adjustment of the mA and/or kV according to patient size and/or use of iterative reconstruction technique.  COMPARISON:  Ultrasound on 02/01/2022 and noncontrast CT on 01/14/2019  FINDINGS: Lower chest: No acute findings.  Hepatobiliary: No mass visualized on this unenhanced exam. Gallbladder is unremarkable. No evidence of biliary ductal dilatation.  Pancreas: No mass or inflammatory process visualized on this unenhanced exam.  Spleen:  Within normal limits in size.  Adrenals/Urinary tract: A few tiny 1-2 mm right renal calculi are again seen. Left ureteral stent has been removed since prior exam. Mild-to-moderate bilateral hydroureteronephrosis is seen to the level of the urinary bladder, however there is no evidence of ureteral calculi. Stable mild diffuse bladder wall thickening.  Stomach/Bowel: Small hiatal hernia again noted. No evidence of obstruction, inflammatory process, or abnormal fluid collections. Normal appendix visualized.  Vascular/Lymphatic: No pathologically enlarged lymph nodes identified. No evidence of abdominal aortic aneurysm. Aortic atherosclerotic calcification incidentally noted.  Reproductive:  No mass or other significant abnormality.  Other:  None.  Musculoskeletal:  No  suspicious bone lesions identified.  IMPRESSION: Mild-to-moderate bilateral hydroureteronephrosis to the level of the urinary bladder. No evidence of ureteral calculi. This raises suspicion for vesicoureteral reflux.  Stable mild diffuse bladder wall thickening, which may be due to chronic bladder outlet obstruction or cystitis.  Tiny nonobstructing right renal calculi.  Small hiatal hernia.   Electronically Signed By: Marlaine Hind M.D. On: 05/02/2022 12:09   Assessment & Plan:    1. Urinary tract infection with hematuria, site unspecified -Urine for culture -keflex '500mg'$  BID for 7 days -We will start macrodantin '50mg'$  qhs - Urinalysis, Routine w reflex microscopic  2. Nocturia -continue finasteride '5mg'$  daily   3. Benign prostatic hyperplasia with urinary obstruction -rapaflo '8mg'$  daily  4. Nephrolithiasis -followup 6 months with a renal US   No follow-ups on file.  Nicolette Bang, MD  [facility] [place]

## 2022-09-20 NOTE — Patient Instructions (Signed)
Urinary Tract Infection, Adult  A urinary tract infection (UTI) is an infection of any part of the urinary tract. The urinary tract includes the kidneys, ureters, bladder, and urethra. These organs make, store, and get rid of urine in the body. An upper UTI affects the ureters and kidneys. A lower UTI affects the bladder and urethra. What are the causes? Most urinary tract infections are caused by bacteria in your genital area around your urethra, where urine leaves your body. These bacteria grow and cause inflammation of your urinary tract. What increases the risk? You are more likely to develop this condition if: You have a urinary catheter that stays in place. You are not able to control when you urinate or have a bowel movement (incontinence). You are male and you: Use a spermicide or diaphragm for birth control. Have low estrogen levels. Are pregnant. You have certain genes that increase your risk. You are sexually active. You take antibiotic medicines. You have a condition that causes your flow of urine to slow down, such as: An enlarged prostate, if you are male. Blockage in your urethra. A kidney stone. A nerve condition that affects your bladder control (neurogenic bladder). Not getting enough to drink, or not urinating often. You have certain medical conditions, such as: Diabetes. A weak disease-fighting system (immunesystem). Sickle cell disease. Gout. Spinal cord injury. What are the signs or symptoms? Symptoms of this condition include: Needing to urinate right away (urgency). Frequent urination. This may include small amounts of urine each time you urinate. Pain or burning with urination. Blood in the urine. Urine that smells bad or unusual. Trouble urinating. Cloudy urine. Vaginal discharge, if you are male. Pain in the abdomen or the lower back. You may also have: Vomiting or a decreased appetite. Confusion. Irritability or tiredness. A fever or  chills. Diarrhea. The first symptom in older adults may be confusion. In some cases, they may not have any symptoms until the infection has worsened. How is this diagnosed? This condition is diagnosed based on your medical history and a physical exam. You may also have other tests, including: Urine tests. Blood tests. Tests for STIs (sexually transmitted infections). If you have had more than one UTI, a cystoscopy or imaging studies may be done to determine the cause of the infections. How is this treated? Treatment for this condition includes: Antibiotic medicine. Over-the-counter medicines to treat discomfort. Drinking enough water to stay hydrated. If you have frequent infections or have other conditions such as a kidney stone, you may need to see a health care provider who specializes in the urinary tract (urologist). In rare cases, urinary tract infections can cause sepsis. Sepsis is a life-threatening condition that occurs when the body responds to an infection. Sepsis is treated in the hospital with IV antibiotics, fluids, and other medicines. Follow these instructions at home:  Medicines Take over-the-counter and prescription medicines only as told by your health care provider. If you were prescribed an antibiotic medicine, take it as told by your health care provider. Do not stop using the antibiotic even if you start to feel better. General instructions Make sure you: Empty your bladder often and completely. Do not hold urine for long periods of time. Empty your bladder after sex. Wipe from front to back after urinating or having a bowel movement if you are male. Use each tissue only one time when you wipe. Drink enough fluid to keep your urine pale yellow. Keep all follow-up visits. This is important. Contact a health   care provider if: Your symptoms do not get better after 1-2 days. Your symptoms go away and then return. Get help right away if: You have severe pain in  your back or your lower abdomen. You have a fever or chills. You have nausea or vomiting. Summary A urinary tract infection (UTI) is an infection of any part of the urinary tract, which includes the kidneys, ureters, bladder, and urethra. Most urinary tract infections are caused by bacteria in your genital area. Treatment for this condition often includes antibiotic medicines. If you were prescribed an antibiotic medicine, take it as told by your health care provider. Do not stop using the antibiotic even if you start to feel better. Keep all follow-up visits. This is important. This information is not intended to replace advice given to you by your health care provider. Make sure you discuss any questions you have with your health care provider. Document Revised: 02/13/2020 Document Reviewed: 02/13/2020 Elsevier Patient Education  2023 [employer] Inc.  

## 2022-09-21 LAB — URINALYSIS, ROUTINE W REFLEX MICROSCOPIC
Bilirubin, UA: NEGATIVE
Ketones, UA: NEGATIVE
Nitrite, UA: NEGATIVE
Specific Gravity, UA: 1.01 (ref 1.005–1.030)
Urobilinogen, Ur: 0.2 mg/dL (ref 0.2–1.0)
pH, UA: 6.5 (ref 5.0–7.5)

## 2022-09-21 LAB — MICROSCOPIC EXAMINATION: WBC, UA: >30 /hpf — AB (ref 0–5)

## 2022-09-22 ENCOUNTER — Encounter: Payer: Self-pay | Admitting: Urology

## 2022-09-22 LAB — URINE CULTURE

## 2022-09-29 ENCOUNTER — Telehealth: Payer: Self-pay

## 2022-09-29 DIAGNOSIS — N170 Acute kidney failure with tubular necrosis: Secondary | ICD-10-CM | POA: Diagnosis not present

## 2022-09-29 DIAGNOSIS — N189 Chronic kidney disease, unspecified: Secondary | ICD-10-CM | POA: Diagnosis not present

## 2022-09-29 DIAGNOSIS — E1129 Type 2 diabetes mellitus with other diabetic kidney complication: Secondary | ICD-10-CM | POA: Diagnosis not present

## 2022-09-29 DIAGNOSIS — E1122 Type 2 diabetes mellitus with diabetic chronic kidney disease: Secondary | ICD-10-CM | POA: Diagnosis not present

## 2022-09-29 DIAGNOSIS — N200 Calculus of kidney: Secondary | ICD-10-CM | POA: Diagnosis not present

## 2022-09-29 DIAGNOSIS — E876 Hypokalemia: Secondary | ICD-10-CM | POA: Diagnosis not present

## 2022-09-29 DIAGNOSIS — R809 Proteinuria, unspecified: Secondary | ICD-10-CM | POA: Diagnosis not present

## 2022-09-29 DIAGNOSIS — E211 Secondary hyperparathyroidism, not elsewhere classified: Secondary | ICD-10-CM | POA: Diagnosis not present

## 2022-09-29 DIAGNOSIS — N390 Urinary tract infection, site not specified: Secondary | ICD-10-CM | POA: Diagnosis not present

## 2022-09-29 MED ORDER — SULFAMETHOXAZOLE-TRIMETHOPRIM 800-160 MG PO TABS: 1.0000 | ORAL_TABLET | Freq: Two times a day (BID) | ORAL | 0 refills | Status: DC

## 2022-09-29 NOTE — Telephone Encounter (Signed)
Return call to patient's daughter. Daughter states that patient came to do a urine drop off last week and was started on treatment Keflex, patient went to PCP appt and a urine sample was taken, patient urine was cloudy. Consulted with Dr. Alyson Ingles and verbal from Dr. Alyson Ingles to start patient on Bacterium to cover infection. Daughter voiced understanding.

## 2022-10-04 DIAGNOSIS — E871 Hypo-osmolality and hyponatremia: Secondary | ICD-10-CM | POA: Diagnosis not present

## 2022-10-04 DIAGNOSIS — N189 Chronic kidney disease, unspecified: Secondary | ICD-10-CM | POA: Diagnosis not present

## 2022-10-04 DIAGNOSIS — E211 Secondary hyperparathyroidism, not elsewhere classified: Secondary | ICD-10-CM | POA: Diagnosis not present

## 2022-10-04 DIAGNOSIS — E1122 Type 2 diabetes mellitus with diabetic chronic kidney disease: Secondary | ICD-10-CM | POA: Diagnosis not present

## 2022-10-04 DIAGNOSIS — N1330 Unspecified hydronephrosis: Secondary | ICD-10-CM | POA: Diagnosis not present

## 2022-10-04 DIAGNOSIS — E1129 Type 2 diabetes mellitus with other diabetic kidney complication: Secondary | ICD-10-CM | POA: Diagnosis not present

## 2022-10-04 DIAGNOSIS — N200 Calculus of kidney: Secondary | ICD-10-CM | POA: Diagnosis not present

## 2022-10-04 DIAGNOSIS — N390 Urinary tract infection, site not specified: Secondary | ICD-10-CM | POA: Diagnosis not present

## 2022-10-04 DIAGNOSIS — R809 Proteinuria, unspecified: Secondary | ICD-10-CM | POA: Diagnosis not present

## 2022-10-06 NOTE — Progress Notes (Signed)
[facility] Quality Team Note  Name: Jacob Fowler Date of Birth: 12-20-1950 MRN: [id number] Date: 10/06/2022  [facility] Quality Team has reviewed this patient's chart, please see recommendations below:  [facility] Quality Other; (Called patient to offer [place] event 10/19/2022, left voicemail).

## 2022-10-23 DIAGNOSIS — E211 Secondary hyperparathyroidism, not elsewhere classified: Secondary | ICD-10-CM | POA: Diagnosis not present

## 2022-10-23 DIAGNOSIS — E1122 Type 2 diabetes mellitus with diabetic chronic kidney disease: Secondary | ICD-10-CM | POA: Diagnosis not present

## 2022-10-23 DIAGNOSIS — N1330 Unspecified hydronephrosis: Secondary | ICD-10-CM | POA: Diagnosis not present

## 2022-10-23 DIAGNOSIS — E871 Hypo-osmolality and hyponatremia: Secondary | ICD-10-CM | POA: Diagnosis not present

## 2022-10-23 DIAGNOSIS — N390 Urinary tract infection, site not specified: Secondary | ICD-10-CM | POA: Diagnosis not present

## 2022-10-23 DIAGNOSIS — N200 Calculus of kidney: Secondary | ICD-10-CM | POA: Diagnosis not present

## 2022-10-23 DIAGNOSIS — E1129 Type 2 diabetes mellitus with other diabetic kidney complication: Secondary | ICD-10-CM | POA: Diagnosis not present

## 2022-10-23 DIAGNOSIS — R809 Proteinuria, unspecified: Secondary | ICD-10-CM | POA: Diagnosis not present

## 2022-10-23 DIAGNOSIS — N189 Chronic kidney disease, unspecified: Secondary | ICD-10-CM | POA: Diagnosis not present

## 2022-10-25 ENCOUNTER — Telehealth: Payer: Self-pay

## 2022-10-25 ENCOUNTER — Ambulatory Visit (INDEPENDENT_AMBULATORY_CARE_PROVIDER_SITE_OTHER): Payer: PPO | Admitting: Urology

## 2022-10-25 DIAGNOSIS — N390 Urinary tract infection, site not specified: Secondary | ICD-10-CM

## 2022-10-25 DIAGNOSIS — R319 Hematuria, unspecified: Secondary | ICD-10-CM

## 2022-10-25 LAB — MICROSCOPIC EXAMINATION
RBC, Urine: >30 /hpf — AB (ref 0–2)
WBC, UA: >30 /hpf — AB (ref 0–5)

## 2022-10-25 LAB — URINALYSIS, ROUTINE W REFLEX MICROSCOPIC
Bilirubin, UA: NEGATIVE
Ketones, UA: NEGATIVE
Nitrite, UA: NEGATIVE
Specific Gravity, UA: 1.015 (ref 1.005–1.030)
Urobilinogen, Ur: 0.2 mg/dL (ref 0.2–1.0)
pH, UA: 7 (ref 5.0–7.5)

## 2022-10-25 MED ORDER — SULFAMETHOXAZOLE-TRIMETHOPRIM 800-160 MG PO TABS
1.0000 | ORAL_TABLET | Freq: Two times a day (BID) | ORAL | 0 refills | Status: DC
Start: 2022-10-25 — End: 2022-10-25

## 2022-10-25 MED ORDER — SULFAMETHOXAZOLE-TRIMETHOPRIM 800-160 MG PO TABS
1.0000 | ORAL_TABLET | Freq: Two times a day (BID) | ORAL | 0 refills | Status: DC
Start: 2022-10-25 — End: 2022-11-09

## 2022-10-25 NOTE — Telephone Encounter (Signed)
Return call to Mitizi patient's daughter. Mitizi voiced that patient has a UTI and his blood sugar are around 324. Verbal from Dr. Ronne Binning for patient to drop off urine. Mitizi voiced understanding

## 2022-10-25 NOTE — Progress Notes (Signed)
Patient presents today with complaints of  UTI.  UA and Culture done today.  Dr. Ronne Binning reviewed results and Bactrim DS 800-160 MG X 7 days . Patient aware of MD recommendations and that we will reach out with culture results.      [id number], CMA

## 2022-10-27 ENCOUNTER — Ambulatory Visit (INDEPENDENT_AMBULATORY_CARE_PROVIDER_SITE_OTHER): Payer: PPO | Admitting: Nurse Practitioner

## 2022-10-27 ENCOUNTER — Encounter: Payer: Self-pay | Admitting: Nurse Practitioner

## 2022-10-27 VITALS — BP 100/64 | HR 88 | Temp 97.7°F | Resp 20

## 2022-10-27 DIAGNOSIS — G35 Multiple sclerosis: Secondary | ICD-10-CM

## 2022-10-27 DIAGNOSIS — E1169 Type 2 diabetes mellitus with other specified complication: Secondary | ICD-10-CM

## 2022-10-27 DIAGNOSIS — E538 Deficiency of other specified B group vitamins: Secondary | ICD-10-CM

## 2022-10-27 DIAGNOSIS — Z125 Encounter for screening for malignant neoplasm of prostate: Secondary | ICD-10-CM | POA: Diagnosis not present

## 2022-10-27 DIAGNOSIS — I25118 Atherosclerotic heart disease of native coronary artery with other forms of angina pectoris: Secondary | ICD-10-CM | POA: Diagnosis not present

## 2022-10-27 DIAGNOSIS — I152 Hypertension secondary to endocrine disorders: Secondary | ICD-10-CM

## 2022-10-27 DIAGNOSIS — E785 Hyperlipidemia, unspecified: Secondary | ICD-10-CM

## 2022-10-27 DIAGNOSIS — E1165 Type 2 diabetes mellitus with hyperglycemia: Secondary | ICD-10-CM | POA: Diagnosis not present

## 2022-10-27 DIAGNOSIS — E559 Vitamin D deficiency, unspecified: Secondary | ICD-10-CM

## 2022-10-27 DIAGNOSIS — E1159 Type 2 diabetes mellitus with other circulatory complications: Secondary | ICD-10-CM

## 2022-10-27 DIAGNOSIS — I700 Atherosclerosis of aorta: Secondary | ICD-10-CM

## 2022-10-27 DIAGNOSIS — E039 Hypothyroidism, unspecified: Secondary | ICD-10-CM

## 2022-10-27 DIAGNOSIS — Z794 Long term (current) use of insulin: Secondary | ICD-10-CM

## 2022-10-27 LAB — BAYER DCA HB A1C WAIVED: HB A1C (BAYER DCA - WAIVED): 11.5 % — ABNORMAL HIGH (ref 4.8–5.6)

## 2022-10-27 MED ORDER — INSULIN GLARGINE 100 UNITS/ML SOLOSTAR PEN: 30.0000 [IU] | PEN_INJECTOR | Freq: Every day | SUBCUTANEOUS | 11 refills | Status: DC

## 2022-10-27 NOTE — Progress Notes (Signed)
Subjective:    [name], male    DOB: 30-Mar-1951, 72 y.o.   MRN: [id number]   Chief Complaint: medical management of chronic issues     HPI:  Jacob Fowler is a 72 y.o. who identifies as a male who was assigned male at birth.   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 Hypertension associated with type 2 diabetes mellitus No c/o chest pain, sob or headache. Does not check blood pressure at home. BP Readings from Last 3 Encounters:  09/20/22 (!) 156/92  08/16/22 131/87  07/27/22 100/69     2. Aortic atherosclerosis 3. Atherosclerosis of native coronary artery of native heart with stable angina pectoris Has not seen cardiology . Denies any chest pain  4. Hyperlipidemia associated with type 2 diabetes mellitus Eats whatever his family fixes for him Lab Results  Component Value Date   CHOL 102 07/27/2022   HDL 22 (L) 07/27/2022   LDLCALC 47 07/27/2022   TRIG 199 (H) 07/27/2022   CHOLHDL 4.6 07/27/2022     5. Uncontrolled type 2 diabetes mellitus with hyperglycemia, without long-term current use of insulin Fasting blood sugars are running have been running up to 300. He has  uti wich increased when that started  6. Acquired hypothyroidism No issues that aware of Lab Results  Component Value Date   TSH 4.240 11/30/2021     7. Vitamin D deficiency Is on daily vitamin d supplement  8. Multiple sclerosis Is wheel chair bound now. Has to have help with every day ADL's. Sees neurology every 6 months  9. B12 deficiency Is on monthly b12 injection   New complaints: None today  Allergies  Allergen Reactions   Farxiga [Dapagliflozin] Other (See Comments)    unable to take Marcelline Deist due to recurrent UTIs, urinary retention and hydronephrosis    Chauncey Mann [Finerenone] Other (See Comments)    hyperkalemia    Outpatient Encounter Medications as of 10/27/2022  Medication Sig   aspirin 81 MG EC tablet Take 81 mg by mouth daily.   cephALEXin (KEFLEX) 500 MG capsule Take 1 capsule (500 mg total) by mouth 2 (two) times daily.   Cholecalciferol (VITAMIN D) 50 MCG (2000 UT) tablet Take 2,000 Units by mouth daily.   cyanocobalamin (VITAMIN B12) 1000 MCG/ML injection INJECT INTO THE SKIN ONCE A MONTH   doxycycline (VIBRA-TABS) 100 MG tablet Take 1 tablet (100 mg total) by mouth 2 (two) times daily. 1 po bid   ferrous sulfate 325 (65 FE) MG tablet Take 1 tablet (325 mg total) by mouth daily with breakfast.   finasteride (PROSCAR) 5 MG tablet Take 1 tablet (5 mg total) by mouth daily.   glucose blood test strip Use as instructed   insulin glargine (LANTUS SOLOSTAR) 100 UNIT/ML Solostar Pen Inject 20 Units into the skin daily.   insulin glargine (LANTUS) 100 unit/mL SOPN Inject 20 Units into the skin daily.   Insulin Pen Needle 31G X 5 MM MISC Use with lantus pen as directed   isosorbide mononitrate (IMDUR) 60 MG 24 hr tablet TAKE 1/2 TABLET (30MG  TOTAL) BY MOUTH DAILY   levothyroxine (SYNTHROID) 75 MCG tablet TAKE ONE TABLET ( TOTAL) BY MOUTH DAILY BEFORE BREAKFAST   Menthol-Zinc Oxide (CALMOSEPTINE) 0.44-20.6 % OINT Apply small amount o affected are BID   nitrofurantoin (MACRODANTIN) 50 MG capsule  Take 1 capsule (50 mg total) by mouth at bedtime.   omeprazole (PRILOSEC) 20 MG capsule Take 1 capsule (20 mg total) by mouth daily.   rosuvastatin (CRESTOR) 10 MG tablet Take 1 tablet (10 mg total) by mouth daily.   silodosin (RAPAFLO) 8 MG CAPS capsule Take 1 capsule (8 mg total) by mouth at bedtime.   sodium bicarbonate 650 MG tablet Take 650 mg by mouth 4 (four) times daily.   sulfamethoxazole-trimethoprim (BACTRIM DS) 800-160 MG tablet Take 1 tablet by mouth 2 (two) times daily.   sulfamethoxazole-trimethoprim (BACTRIM DS) 800-160 MG tablet Take 1 tablet by mouth 2 (two) times daily.    [DISCONTINUED] atorvastatin (LIPITOR) 10 MG tablet Take 10 mg by mouth daily.   [DISCONTINUED] niacin-simvastatin (SIMCOR) 500-20 MG 24 hr tablet Take 1 tablet by mouth at bedtime.     No facility-administered encounter medications on file as of 10/27/2022.    Past Surgical History:  Procedure Laterality Date   CARDIAC CATHETERIZATION  02-26-2009 ---  DR Julien Nordmann   NONOBSTRUCTIVE CAD/ 50% DISTAL RCA/ 30% LAD / NORMAL LVF   CYSTOSCOPY W/ RETROGRADES  10/30/2011   Procedure: CYSTOSCOPY WITH RETROGRADE PYELOGRAM;  Surgeon: Garnett Farm, MD;  Location: [facility];  Service: Urology;  Laterality: Right;   CYSTOSCOPY WITH LITHOLAPAXY  10/30/2011   Procedure: CYSTOSCOPY WITH LITHOLAPAXY;  Surgeon: Garnett Farm, MD;  Location: [facility];  Service: Urology;  Laterality: N/A;   CYSTOSCOPY WITH RETROGRADE PYELOGRAM, URETEROSCOPY AND STENT PLACEMENT Left 11/25/2020   Procedure: CYSTOSCOPY WITH LEFT RETROGRADE PYELOGRAM, LEFT URETEROSCOPY WITH LASER AND LEFT URETERAL STENT PLACEMENT;  Surgeon: Malen Gauze, MD;  Location: [facility];  Service: Urology;  Laterality: Left;   EXTRACORPOREAL SHOCK WAVE LITHOTRIPSY  09/05/2010   RIGHT   EYE SURGERY Right 08/30/2018   HOLMIUM LASER APPLICATION Left 11/25/2020   Procedure: HOLMIUM LASER APPLICATION;  Surgeon: Malen Gauze, MD;  Location: [facility];  Service: Urology;  Laterality: Left;   NESBIT PROCEDURE  10/17/2004   CORRECTION OF PENILE ANGULATION (PEYRONIES DISEASE)   UTI  06/17/2022    Family History  Problem Relation Age of Onset   Stroke Mother    Heart attack Mother    Diabetes Mother    Hypertension Mother    Renal cancer Father    Diabetes Sister    Heart attack Brother    Diabetes Son    Heart attack Maternal Grandfather    Heart attack Brother    Bladder Cancer Brother       Controlled substance contract: n/a     Review of Systems  Constitutional:  Negative for diaphoresis.   Eyes:  Negative for pain.  Respiratory:  Negative for shortness of breath.   Cardiovascular:  Negative for chest pain, palpitations and leg swelling.  Gastrointestinal:  Negative for abdominal pain.  Endocrine: Negative for polydipsia.  Skin:  Negative for rash.  Neurological:  Negative for dizziness, weakness and headaches.  Hematological:  Does not bruise/bleed easily.  All other systems reviewed and are negative.      Objective:   Physical Exam Vitals and nursing note reviewed.  Constitutional:      Appearance: Normal appearance. He is well-developed.  HENT:     Head: Normocephalic.     Nose: Nose normal.     Mouth/Throat:     Mouth: Mucous membranes are moist.     Pharynx: Oropharynx is clear.  Eyes:     Pupils: Pupils are equal,  round, and reactive to light.  Neck:     Thyroid: No thyroid mass or thyromegaly.     Vascular: No carotid bruit or JVD.     Trachea: Phonation normal.  Cardiovascular:     Rate and Rhythm: Normal rate and regular rhythm.  Pulmonary:     Effort: Pulmonary effort is normal. No respiratory distress.     Breath sounds: Normal breath sounds.  Abdominal:     General: Bowel sounds are normal.     Palpations: Abdomen is soft.     Tenderness: There is no abdominal tenderness.  Musculoskeletal:        General: Normal range of motion.     Cervical back: Normal range of motion and neck supple.     Comments: In wheelchair Right sided weakness  Lymphadenopathy:     Cervical: No cervical adenopathy.  Skin:    General: Skin is warm and dry.  Neurological:     Mental Status: He is alert and oriented to person, place, and time.  Psychiatric:        Behavior: Behavior normal.        Thought Content: Thought content normal.        Judgment: Judgment normal.    BP 100/64   Pulse 88   Temp 97.7 F (36.5 C) (Temporal)   Resp 20   SpO2 97%   Hgba1c 11.5%      Assessment & Plan:   Atilano Covelli Mcmillion in today with chief complaint of  Medical Management of Chronic Issues   1. Hypertension associated with type 2 diabetes mellitus Low sodium diet - CBC with Differential/Platelet - CMP14+EGFR  2. Aortic atherosclerosis 3. Atherosclerosis of native coronary artery of native heart with stable angina pectoris  4. Hyperlipidemia associated with type 2 diabetes mellitus Low fat diet - Lipid panel  5. Uncontrolled type 2 diabetes mellitus with hyperglycemia, without long-term current use of insulin Increase lantus by 3 units every 3 days until gets to 30u- if not bring blood sugars down let me know - Bayer DCA Hb A1c Waived - insulin glargine (LANTUS) 100 unit/mL SOPN; Inject 30 Units into the skin daily.  Dispense: 15 mL; Refill: 11  6. Acquired hypothyroidism Labs pending  7. Vitamin D deficiency Continue vitamin d supplement  8. Multiple sclerosis Keep follow up with neurology  9. B12 deficiency Continue b12 injections  10. Screening for prostate cancer - PSA, total and free    The above assessment and management plan was discussed with the patient. The patient verbalized understanding of and has agreed to the management plan. Patient is aware to call the clinic if symptoms persist or worsen. Patient is aware when to return to the clinic for a follow-up visit. Patient educated on when it is appropriate to go to the emergency department.   Mary-Margaret Daphine Deutscher, FNP

## 2022-10-28 LAB — CBC WITH DIFFERENTIAL/PLATELET
EOS (ABSOLUTE): 0.3 10*3/uL (ref 0.0–0.4)
Immature Grans (Abs): 0.1 10*3/uL (ref 0.0–0.1)
Immature Granulocytes: 1 %
Neutrophils Absolute: 6.9 10*3/uL (ref 1.4–7.0)
Platelets: 279 10*3/uL (ref 150–450)
RDW: 15 % (ref 11.6–15.4)
WBC: 9.6 10*3/uL (ref 3.4–10.8)

## 2022-10-28 LAB — CMP14+EGFR
BUN/Creatinine Ratio: 17 (ref 10–24)
BUN: 42 mg/dL — ABNORMAL HIGH (ref 8–27)
Bilirubin Total: <0.2 mg/dL (ref 0.0–1.2)
Creatinine, Ser: 2.47 mg/dL — ABNORMAL HIGH (ref 0.76–1.27)
Globulin, Total: 3 g/dL (ref 1.5–4.5)

## 2022-10-28 LAB — PSA, TOTAL AND FREE: PSA, Free Pct: 16.7 %

## 2022-10-28 LAB — LIPID PANEL
Triglycerides: 350 mg/dL — ABNORMAL HIGH (ref 0–149)
VLDL Cholesterol Cal: 54 mg/dL — ABNORMAL HIGH (ref 5–40)

## 2022-10-30 LAB — LIPID PANEL
Chol/HDL Ratio: 5.8 ratio — ABNORMAL HIGH (ref 0.0–5.0)
Cholesterol, Total: 127 mg/dL (ref 100–199)
HDL: 22 mg/dL — ABNORMAL LOW (ref >39)
LDL Chol Calc (NIH): 51 mg/dL (ref 0–99)

## 2022-10-30 LAB — CBC WITH DIFFERENTIAL/PLATELET
Basophils Absolute: 0 10*3/uL (ref 0.0–0.2)
Basos: 0 %
Eos: 3 %
Hematocrit: 35.4 % — ABNORMAL LOW (ref 37.5–51.0)
Hemoglobin: 11.5 g/dL — ABNORMAL LOW (ref 13.0–17.7)
Lymphocytes Absolute: 1.5 10*3/uL (ref 0.7–3.1)
Lymphs: 16 %
MCH: 28.2 pg (ref 26.6–33.0)
MCHC: 32.5 g/dL (ref 31.5–35.7)
MCV: 87 fL (ref 79–97)
Monocytes Absolute: 0.7 10*3/uL (ref 0.1–0.9)
Monocytes: 8 %
Neutrophils: 72 %
RBC: 4.08 x10E6/uL — ABNORMAL LOW (ref 4.14–5.80)

## 2022-10-30 LAB — CMP14+EGFR
ALT: 8 IU/L (ref 0–44)
AST: 12 IU/L (ref 0–40)
Albumin/Globulin Ratio: 1.3 (ref 1.2–2.2)
Albumin: 4 g/dL (ref 3.8–4.8)
Alkaline Phosphatase: 116 IU/L (ref 44–121)
CO2: 17 mmol/L — ABNORMAL LOW (ref 20–29)
Calcium: 9.7 mg/dL (ref 8.6–10.2)
Chloride: 92 mmol/L — ABNORMAL LOW (ref 96–106)
Glucose: 520 mg/dL (ref 70–99)
Potassium: 4.2 mmol/L (ref 3.5–5.2)
Sodium: 129 mmol/L — ABNORMAL LOW (ref 134–144)
Total Protein: 7 g/dL (ref 6.0–8.5)
eGFR: 27 mL/min/{1.73_m2} — ABNORMAL LOW (ref >59)

## 2022-10-30 LAB — URINE CULTURE

## 2022-10-30 LAB — PSA, TOTAL AND FREE
PSA, Free: 0.2 ng/mL
Prostate Specific Ag, Serum: 1.2 ng/mL (ref 0.0–4.0)

## 2022-11-01 ENCOUNTER — Other Ambulatory Visit: Payer: Self-pay | Admitting: Nurse Practitioner

## 2022-11-09 ENCOUNTER — Encounter: Payer: Self-pay | Admitting: Family Medicine

## 2022-11-09 ENCOUNTER — Telehealth: Payer: Self-pay | Admitting: Nurse Practitioner

## 2022-11-09 ENCOUNTER — Telehealth (INDEPENDENT_AMBULATORY_CARE_PROVIDER_SITE_OTHER): Payer: PPO | Admitting: Family Medicine

## 2022-11-09 DIAGNOSIS — R319 Hematuria, unspecified: Secondary | ICD-10-CM

## 2022-11-09 DIAGNOSIS — G35 Multiple sclerosis: Secondary | ICD-10-CM | POA: Diagnosis not present

## 2022-11-09 DIAGNOSIS — E1165 Type 2 diabetes mellitus with hyperglycemia: Secondary | ICD-10-CM | POA: Diagnosis not present

## 2022-11-09 DIAGNOSIS — N390 Urinary tract infection, site not specified: Secondary | ICD-10-CM

## 2022-11-09 LAB — URINALYSIS
Bilirubin, UA: NEGATIVE
Ketones, UA: NEGATIVE
Nitrite, UA: NEGATIVE
Specific Gravity, UA: 1.015 (ref 1.005–1.030)
Urobilinogen, Ur: 0.2 mg/dL (ref 0.2–1.0)
pH, UA: 6.5 (ref 5.0–7.5)

## 2022-11-09 MED ORDER — CIPROFLOXACIN HCL 500 MG PO TABS
500.0000 mg | ORAL_TABLET | Freq: Two times a day (BID) | ORAL | 0 refills | Status: DC
Start: 2022-11-09 — End: 2022-11-09

## 2022-11-09 MED ORDER — CIPROFLOXACIN HCL 500 MG PO TABS: 500.0000 mg | ORAL_TABLET | Freq: Two times a day (BID) | ORAL | 0 refills | Status: DC

## 2022-11-09 NOTE — Telephone Encounter (Signed)
Lantus  30 units started last Friday. Takes 1st thing in am.  Has no symptoms.  Per sister when sugar is elevated he usually has a UTI. Denies symptoms of a UTI but doesn't usually show any.  Pt has MS and is unable to walk.

## 2022-11-09 NOTE — Telephone Encounter (Signed)
Well first thing I would say have him make an appointment to get in here and get tested for UTI, I do agree that usually kicks up blood sugars.

## 2022-11-09 NOTE — Telephone Encounter (Signed)
Virtual appt made with Stacks the pm. Daughter made aware.  Will bring pts urine prior to the appt. Orders placed.

## 2022-11-09 NOTE — Progress Notes (Signed)
Subjective:  [name], male    DOB: 1950/07/30  Age: 72 y.o. MRN: [id number]  CC: No chief complaint on file.  =HPI Karron Alvizo Friscia presents for sugar went up to 465. Concerned for infection. Doesn't get dysuria or frequency. He has MS and diabetic.  He gets infections freqeuntly. Just finished a course of sulfa. Takes macrodatin 50 mg prophlaxis.     10/27/2022    3:37 PM 07/27/2022    4:03 PM 06/26/2022   10:36 AM  Depression screen PHQ 2/9  Decreased Interest 0 0 0  Down, Depressed, Hopeless 0 0 0  PHQ - 2 Score 0 0 0  Altered sleeping 0 0 0  Tired, decreased energy 0 0 0  Change in appetite 0 0 0  Feeling bad or failure about yourself  0 0 0  Trouble concentrating 0 0 0  Moving slowly or fidgety/restless 0 0 0  Suicidal thoughts 0 0 0  PHQ-9 Score 0 0 0  Difficult doing work/chores Not difficult at all Not difficult at all Not difficult at all    History Bartley has a past medical history of Acquired leg length discrepancy, Bladder stone, BPH associated with nocturia, CAD (coronary artery disease) (CARDIOLOGIST- DR HOCHREIN--  LAST VISIT 09-27-2011 IN [facility]), Cataract, GERD (gastroesophageal reflux disease), H/O hiatal hernia, Heart murmur, Heartburn, High cholesterol, History of nephrolithiasis (2009), adenomatous colonic polyps, Hyperlipidemia, Hypertension, Movement disorder, Multiple sclerosis (DX  10/10---  NEUROLOGIST  DR Gerlene Burdock [facility] ([place] Nephrolithiasis (08/30/2020), Neuromuscular disorder, NIDDM (non-insulin dependent diabetes mellitus), Peyronie's disease, RBBB, Renal stone (RIGHT), Rosacea, and Vision abnormalities.   He has a past surgical history that includes Extracorporeal shock wave lithotripsy (09/05/2010); Nesbit procedure (10/17/2004); Cystoscopy with litholapaxy (10/30/2011); Cystoscopy w/ retrogrades (10/30/2011); Eye surgery (Right, 08/30/2018); Cardiac catheterization (02-26-2009 ---  DR Julien Nordmann); Cystoscopy  with retrograde pyelogram, ureteroscopy and stent placement (Left, 11/25/2020); Holmium laser application (Left, 11/25/2020); and UTI (06/17/2022).   His family history includes Bladder Cancer in his brother; Diabetes in his mother, sister, and son; Heart attack in his brother, brother, maternal grandfather, and mother; Hypertension in his mother; Renal cancer in his father; Stroke in his mother.He reports that he quit smoking about 49 years ago. His smoking use included cigarettes. He has a 7.00 pack-year smoking history. His smokeless tobacco use includes chew. He reports that he does not drink alcohol and does not use drugs.    ROS Review of Systems  Objective:  There were no vitals taken for this visit.  BP Readings from Last 3 Encounters:  10/27/22 100/64  09/20/22 (!) 156/92  08/16/22 131/87    Wt Readings from Last 3 Encounters:  06/17/22 146 lb 9.7 oz (66.5 kg)  01/07/22 156 lb (70.8 kg)  11/21/21 154 lb (69.9 kg)     Physical Exam Neurological:     Mental Status: He is alert and oriented to person, place, and time.     Coordination: Coordination abnormal (slumped in chair with daughter attending him).    Video only   Assessment & Plan:   Diagnoses and all orders for this visit:  Multiple sclerosis  Uncontrolled type 2 diabetes mellitus with hyperglycemia, without long-term current use of insulin -     Urinalysis -     Urine Culture -     ciprofloxacin (CIPRO) 500 MG tablet; Take 1 tablet (500 mg total) by mouth 2 (two) times daily. For prostate. Take all of these.  Urinary tract infection with hematuria,  site unspecified  Other orders -     Discontinue: ciprofloxacin (CIPRO) 500 MG tablet; Take 1 tablet (500 mg total) by mouth 2 (two) times daily. For prostate. Take all of these.   Needs repeat UA after antibiotic is finished, due to the hematuria and frequn infections    I have discontinued Ria Clock. Montour "Jack"'s Lantus SoloStar and  sulfamethoxazole-trimethoprim. I am also having him maintain his aspirin EC, glucose blood, sodium bicarbonate, Vitamin D, Insulin Pen Needle, Calmoseptine, omeprazole, rosuvastatin, isosorbide mononitrate, ferrous sulfate, levothyroxine, nitrofurantoin, finasteride, silodosin, olmesartan, VITAMIN D PO, Ascorbic Acid (VITAMIN C PO), insulin glargine, cyanocobalamin, and ciprofloxacin.  Allergies as of 11/09/2022       Reactions   Farxiga [dapagliflozin] Other (See Comments)   unable to take Marcelline Deist due to recurrent UTIs, urinary retention and hydronephrosis   Kerendia [finerenone] Other (See Comments)   hyperkalemia        Medication List        Accurate as of November 09, 2022  2:21 PM. If you have any questions, ask your nurse or doctor.          STOP taking these medications    sulfamethoxazole-trimethoprim 800-160 MG tablet Commonly known as: BACTRIM DS Stopped by: Mechele Claude, MD       TAKE these medications    aspirin EC 81 MG tablet Take 81 mg by mouth daily.   Calmoseptine 0.44-20.6 % Oint Generic drug: Menthol-Zinc Oxide Apply small amount o affected are BID   ciprofloxacin 500 MG tablet Commonly known as: Cipro Take 1 tablet (500 mg total) by mouth 2 (two) times daily. For prostate. Take all of these. Started by: Mechele Claude, MD   cyanocobalamin 1000 MCG/ML injection Commonly known as: VITAMIN B12 INJECT INTO THE SKIN ONCE A MONTH   ferrous sulfate 325 (65 FE) MG tablet Take 1 tablet (325 mg total) by mouth daily with breakfast.   finasteride 5 MG tablet Commonly known as: PROSCAR Take 1 tablet (5 mg total) by mouth daily.   glucose blood test strip Use as instructed   insulin glargine 100 unit/mL Sopn Commonly known as: LANTUS Inject 30 Units into the skin daily. What changed: Another medication with the same name was removed. Continue taking this medication, and follow the directions you see here. Changed by: Mechele Claude, MD    Insulin Pen Needle 31G X 5 MM Misc Use with lantus pen as directed   isosorbide mononitrate 60 MG 24 hr tablet Commonly known as: IMDUR TAKE 1/2 TABLET (  TOTAL) BY MOUTH DAILY   levothyroxine 75 MCG tablet Commonly known as: SYNTHROID TAKE ONE TABLET ( TOTAL) BY MOUTH DAILY BEFORE BREAKFAST   nitrofurantoin 50 MG capsule Commonly known as: Macrodantin Take 1 capsule (50 mg total) by mouth at bedtime.   olmesartan 5 MG tablet Commonly known as: BENICAR Take by mouth.   omeprazole 20 MG capsule Commonly known as: PRILOSEC Take 1 capsule (20 mg total) by mouth daily.   rosuvastatin 10 MG tablet Commonly known as: Crestor Take 1 tablet (10 mg total) by mouth daily.   silodosin 8 MG Caps capsule Commonly known as: RAPAFLO Take 1 capsule (8 mg total) by mouth at bedtime.   sodium bicarbonate 650 MG tablet Take 650 mg by mouth 4 (four) times daily.   VITAMIN C PO Take by mouth.   Vitamin D 50 MCG (2000 UT) tablet Take 2,000 Units by mouth daily.   VITAMIN D PO Take by mouth.  Video visit  I discussed the limitations, risks, security and privacy concerns of performing an evaluation and management service by video and the availability of in person appointments. I also discussed with the patient that there may be a patient responsible charge related to this service. The patient expressed understanding and agreed to proceed. Pt. Is at home. Dr. Darlyn Read is in his office.  Follow Up Instructions:   I discussed the assessment and treatment plan with the patient. The patient was provided an opportunity to ask questions and all were answered. The patient agreed with the plan and demonstrated an understanding of the instructions.   The patient was advised to call back or seek an in-person evaluation if the symptoms worsen or if the condition fails to improve as anticipated.  Total minutes including chart review and phone contact time: 14   Follow-up: No  follow-ups on file.  Mechele Claude, M.D.

## 2022-11-11 LAB — URINE CULTURE

## 2022-11-13 DIAGNOSIS — H2512 Age-related nuclear cataract, left eye: Secondary | ICD-10-CM | POA: Diagnosis not present

## 2022-11-20 ENCOUNTER — Other Ambulatory Visit: Payer: Self-pay | Admitting: Nurse Practitioner

## 2022-11-20 ENCOUNTER — Telehealth: Payer: Self-pay | Admitting: Nurse Practitioner

## 2022-11-20 DIAGNOSIS — E1165 Type 2 diabetes mellitus with hyperglycemia: Secondary | ICD-10-CM

## 2022-11-20 MED ORDER — INSULIN GLARGINE 100 UNIT/ML ~~LOC~~ SOLN: 55.0000 [IU] | Freq: Every day | SUBCUTANEOUS | 11 refills | Status: DC

## 2022-11-20 NOTE — Telephone Encounter (Signed)
Patient currently on an antibiotic. Checked BS  yesterday AM and it was 444. They contacted the Dr on call and they advised to increase his insulin. They gave 45units yesterday instead of 35 units. This morning BS was 296. Should they continue with the 45 units while on the antibiotic? Or do you want to do something different?

## 2022-11-20 NOTE — Telephone Encounter (Signed)
PCP called and spoke directly with patients wife

## 2022-11-20 NOTE — Telephone Encounter (Signed)
Pts daughter called to let MMM know that she had to call the doctor on call yesterday about pts BS and was advised to increase pts insulin. Needs advise from MMM.

## 2022-11-20 NOTE — Telephone Encounter (Signed)
Yes continue 45 u wihile on antibiotics

## 2022-11-23 ENCOUNTER — Ambulatory Visit (INDEPENDENT_AMBULATORY_CARE_PROVIDER_SITE_OTHER): Payer: PPO

## 2022-11-23 VITALS — Ht 66.0 in | Wt 155.0 lb

## 2022-11-23 DIAGNOSIS — Z0000 Encounter for general adult medical examination without abnormal findings: Secondary | ICD-10-CM

## 2022-11-23 NOTE — Patient Instructions (Signed)
[name] , Thank you for taking time to come for your Medicare Wellness Visit. I appreciate your ongoing commitment to your health goals. Please review the following plan we discussed and let me know if I can assist you in the future.   These are the goals we discussed:  Goals       DIET - INCREASE WATER INTAKE      Increase water intake to 8 glasses per day - 64 oz.       Exercise 3x per week (30 min per time)      Pt would like to "get around better"  Goals Addressed             This Visit's Progress    Exercise 3x per week (30 min per time)   On track    Pt would like to "get around better"              T2DM PHARMD (pt-stated)      Current Barriers:  Unable to independently afford treatment regimen Unable to achieve control of T2DM, CKD  Suboptimal therapeutic regimen for T2DM, CKD   Pharmacist Clinical Goal(s):  patient will verbalize ability to afford treatment regimen maintain control of T2DM, CKD as evidenced by [facility]  through collaboration with PharmD and provider.   Interventions: 1:1 collaboration with Bennie Pierini, FNP regarding development and update of comprehensive plan of care as evidenced by provider attestation and co-signature Inter-disciplinary care team collaboration (see longitudinal plan of care) Comprehensive medication review performed; medication list updated in electronic medical record  Diabetes: New goal. Uncontrolled-A1C 10.8, GFR 28 (CKD 3B); current treatment:OZEMPIC 2MG , STARTING FARXIGA 10MG  DAILY;  FARXIGA SAMPLES LEFT UP FRONT--FOR T2DM/CKD/CV WILL LIKELY NEED ADDITIONAL THERAPY WILL CONTINUE TO FOLLOW MAY NEED PATIENT ASSISTANCE Denies personal and family history of Medullary thyroid cancer (MTC) Current glucose readings: fasting glucose: 200, post prandial glucose: 200S Denies hypoglycemic/hyperglycemic symptoms Current exercise: N/A; ENCOURAGED Educated on T2DM, NEW FARXIGA, CONTINUE OZEMPIC Recommended  NEW START FARXIGA   Patient Goals/Self-Care Activities patient will:  - take medications as prescribed as evidenced by patient report and record review check glucose DAILY /FASTING, document, and provide at future appointments collaborate with provider on medication access solutions target a minimum of 150 minutes of moderate intensity exercise weekly       Work on getting my glucose levels down. (pt-stated)      Care Coordination Interventions: Reinforced checking glucose as MD has requested (daily) and record. Assessed current diet practices and knowledge of carb counting. Requested that pt with family assistance record dietary intake for several days and try to figure out the carbs in each meal and snack (previously has been given information). Call provider if glucose reading is >400 for instructions. Attend f/u appt with primary care in October.          This is a list of the screening recommended for you and due dates:  Health Maintenance  Topic Date Due   COVID-19 Vaccine (1) Never done   Medicare Annual Wellness Visit  11/22/2022   Yearly kidney health urinalysis for diabetes  12/06/2022   Hepatitis C Screening: USPSTF Recommendation to screen - Ages 18-79 yo.  12/01/2022*   Colon Cancer Screening  01/01/2023   Flu Shot  02/15/2023   Hemoglobin A1C  04/28/2023   Complete foot exam   07/28/2023   Eye exam for diabetics  10/16/2023   Yearly kidney function blood test for diabetes  10/27/2023   DTaP/Tdap/Td vaccine (  3 - Td or Tdap) 02/17/2024   Pneumonia Vaccine  Completed   HPV Vaccine  Aged Out   Stool Blood Test  Discontinued   Zoster (Shingles) Vaccine  Discontinued  *Topic was postponed. The date shown is not the original due date.    Advanced directives: In Chart   Conditions/risks identified: Aim for 30 minutes of exercise or brisk walking, 6-8 glasses of water, and 5 servings of fruits and vegetables each day.   Next appointment: Follow up in one year for  your annual wellness visit.   Preventive Care 52 Years and Older, Male  Preventive care refers to lifestyle choices and visits with your health care provider that can promote health and wellness. What does preventive care include? A yearly physical exam. This is also called an annual well check. Dental exams once or twice a year. Routine eye exams. Ask your health care provider how often you should have your eyes checked. Personal lifestyle choices, including: Daily care of your teeth and gums. Regular physical activity. Eating a healthy diet. Avoiding tobacco and drug use. Limiting alcohol use. Practicing safe sex. Taking low doses of aspirin every day. Taking vitamin and mineral supplements as recommended by your health care provider. What happens during an annual well check? The services and screenings done by your health care provider during your annual well check will depend on your age, overall health, lifestyle risk factors, and family history of disease. Counseling  Your health care provider may ask you questions about your: Alcohol use. Tobacco use. Drug use. Emotional well-being. Home and relationship well-being. Sexual activity. Eating habits. History of falls. Memory and ability to understand (cognition). Work and work Astronomer. Screening  You may have the following tests or measurements: Height, weight, and BMI. Blood pressure. Lipid and cholesterol levels. These may be checked every 5 years, or more frequently if you are over 53 years old. Skin check. Lung cancer screening. You may have this screening every year starting at age 17 if you have a 30-pack-year history of smoking and currently smoke or have quit within the past 15 years. Fecal occult blood test (FOBT) of the stool. You may have this test every year starting at age 59. Flexible sigmoidoscopy or colonoscopy. You may have a sigmoidoscopy every 5 years or a colonoscopy every 10 years starting at age  11. Prostate cancer screening. Recommendations will vary depending on your family history and other risks. Hepatitis C blood test. Hepatitis B blood test. Sexually transmitted disease (STD) testing. Diabetes screening. This is done by checking your blood sugar (glucose) after you have not eaten for a while (fasting). You may have this done every 1-3 years. Abdominal aortic aneurysm (AAA) screening. You may need this if you are a current or former smoker. Osteoporosis. You may be screened starting at age 24 if you are at high risk. Talk with your health care provider about your test results, treatment options, and if necessary, the need for more tests. Vaccines  Your health care provider may recommend certain vaccines, such as: Influenza vaccine. This is recommended every year. Tetanus, diphtheria, and acellular pertussis (Tdap, Td) vaccine. You may need a Td booster every 10 years. Zoster vaccine. You may need this after age 8. Pneumococcal 13-valent conjugate (PCV13) vaccine. One dose is recommended after age 35. Pneumococcal polysaccharide (PPSV23) vaccine. One dose is recommended after age 27. Talk to your health care provider about which screenings and vaccines you need and how often you need them. This information is  not intended to replace advice given to you by your health care provider. Make sure you discuss any questions you have with your health care provider. Document Released: 07/30/2015 Document Revised: 03/22/2016 Document Reviewed: 05/04/2015 Elsevier Interactive Patient Education  2017 [employer].  Fall Prevention in the Home Falls can cause injuries. They can happen to people of all ages. There are many things you can do to make your home safe and to help prevent falls. What can I do on the outside of my home? Regularly fix the edges of walkways and driveways and fix any cracks. Remove anything that might make you trip as you walk through a door, such as a raised step or  threshold. Trim any bushes or trees on the path to your home. Use bright outdoor lighting. Clear any walking paths of anything that might make someone trip, such as rocks or tools. Regularly check to see if handrails are loose or broken. Make sure that both sides of any steps have handrails. Any raised decks and porches should have guardrails on the edges. Have any leaves, snow, or ice cleared regularly. Use sand or salt on walking paths during winter. Clean up any spills in your garage right away. This includes oil or grease spills. What can I do in the bathroom? Use night lights. Install grab bars by the toilet and in the tub and shower. Do not use towel bars as grab bars. Use non-skid mats or decals in the tub or shower. If you need to sit down in the shower, use a plastic, non-slip stool. Keep the floor dry. Clean up any water that spills on the floor as soon as it happens. Remove soap buildup in the tub or shower regularly. Attach bath mats securely with double-sided non-slip rug tape. Do not have throw rugs and other things on the floor that can make you trip. What can I do in the bedroom? Use night lights. Make sure that you have a light by your bed that is easy to reach. Do not use any sheets or blankets that are too big for your bed. They should not hang down onto the floor. Have a firm chair that has side arms. You can use this for support while you get dressed. Do not have throw rugs and other things on the floor that can make you trip. What can I do in the kitchen? Clean up any spills right away. Avoid walking on wet floors. Keep items that you use a lot in easy-to-reach places. If you need to reach something above you, use a strong step stool that has a grab bar. Keep electrical cords out of the way. Do not use floor polish or wax that makes floors slippery. If you must use wax, use non-skid floor wax. Do not have throw rugs and other things on the floor that can make you  trip. What can I do with my stairs? Do not leave any items on the stairs. Make sure that there are handrails on both sides of the stairs and use them. Fix handrails that are broken or loose. Make sure that handrails are as long as the stairways. Check any carpeting to make sure that it is firmly attached to the stairs. Fix any carpet that is loose or worn. Avoid having throw rugs at the top or bottom of the stairs. If you do have throw rugs, attach them to the floor with carpet tape. Make sure that you have a light switch at the top of the  stairs and the bottom of the stairs. If you do not have them, ask someone to add them for you. What else can I do to help prevent falls? Wear shoes that: Do not have high heels. Have rubber bottoms. Are comfortable and fit you well. Are closed at the toe. Do not wear sandals. If you use a stepladder: Make sure that it is fully opened. Do not climb a closed stepladder. Make sure that both sides of the stepladder are locked into place. Ask someone to hold it for you, if possible. Clearly mark and make sure that you can see: Any grab bars or handrails. First and last steps. Where the edge of each step is. Use tools that help you move around (mobility aids) if they are needed. These include: Canes. Walkers. Scooters. Crutches. Turn on the lights when you go into a dark area. Replace any light bulbs as soon as they burn out. Set up your furniture so you have a clear path. Avoid moving your furniture around. If any of your floors are uneven, fix them. If there are any pets around you, be aware of where they are. Review your medicines with your doctor. Some medicines can make you feel dizzy. This can increase your chance of falling. Ask your doctor what other things that you can do to help prevent falls. This information is not intended to replace advice given to you by your health care provider. Make sure you discuss any questions you have with your  health care provider. Document Released: 04/29/2009 Document Revised: 12/09/2015 Document Reviewed: 08/07/2014 Elsevier Interactive Patient Education  2017 [employer].

## 2022-11-23 NOTE — Progress Notes (Signed)
Subjective:   Jacob Fowler is a 72 y.o. male who presents for Medicare Annual/Subsequent preventive examination. I connected with  Jacob Fowler on 11/23/22 by a audio enabled telemedicine application and verified that I am speaking with the correct person using two identifiers.  Patient Location: Home  Provider Location: Home Office  I discussed the limitations of evaluation and management by telemedicine. The patient expressed understanding and agreed to proceed.  Review of Systems     Cardiac Risk Factors include: advanced age (>42men, >73 women);diabetes mellitus;male gender;hypertension;dyslipidemia     Objective:    Today's Vitals   11/23/22 1114  Weight: 155 lb (70.3 kg)  Height: 5\' 6"  (1.676 m)   Body mass index is 25.02 kg/m.     11/23/2022   11:18 AM 06/19/2022    3:25 PM 06/17/2022    8:56 AM 01/07/2022    9:03 AM 11/21/2021   11:21 AM 12/01/2020    2:00 AM 11/30/2020    9:00 AM  Advanced Directives  Does Patient Have a Medical Advance Directive? Yes No Yes No Yes Yes Yes  Type of Estate agent of [place];Living will  Living will  Healthcare Power of [place];Living will Healthcare Power of [place];Living will Healthcare Power of [place];Living will  Does patient want to make changes to medical advance directive? No - Patient declined No - Guardian declined No - Patient declined   No - Patient declined No - Patient declined  Copy of Healthcare Power of Attorney in Chart? Yes - validated most recent copy scanned in chart (See row information)    No - copy requested No - copy requested No - copy requested  Would patient like information on creating a medical advance directive?      No - Patient declined     Current Medications (verified) Outpatient Encounter Medications as of 11/23/2022  Medication Sig   Ascorbic Acid (VITAMIN C PO) Take by mouth.   aspirin 81 MG EC tablet Take 81 mg by mouth daily.   Cholecalciferol  (VITAMIN D) 50 MCG (2000 UT) tablet Take 2,000 Units by mouth daily.   ciprofloxacin (CIPRO) 500 MG tablet Take 1 tablet (500 mg total) by mouth 2 (two) times daily. For prostate. Take all of these.   cyanocobalamin (VITAMIN B12) 1000 MCG/ML injection INJECT INTO THE SKIN ONCE A MONTH   ferrous sulfate 325 (65 FE) MG tablet Take 1 tablet (325 mg total) by mouth daily with breakfast.   finasteride (PROSCAR) 5 MG tablet Take 1 tablet (5 mg total) by mouth daily.   glucose blood test strip Use as instructed   insulin glargine (LANTUS) 100 UNIT/ML injection Inject 0.55 mLs (55 Units total) into the skin daily.   insulin glargine (LANTUS) 100 unit/mL SOPN Inject 30 Units into the skin daily.   Insulin Pen Needle 31G X 5 MM MISC Use with lantus pen as directed   isosorbide mononitrate (IMDUR) 60 MG 24 hr tablet TAKE 1/2 TABLET (30MG  TOTAL) BY MOUTH DAILY   levothyroxine (SYNTHROID) 75 MCG tablet TAKE ONE TABLET ( TOTAL) BY MOUTH DAILY BEFORE BREAKFAST   Menthol-Zinc Oxide (CALMOSEPTINE) 0.44-20.6 % OINT Apply small amount o affected are BID   nitrofurantoin (MACRODANTIN) 50 MG capsule Take 1 capsule (50 mg total) by mouth at bedtime.   olmesartan (BENICAR) 5 MG tablet Take by mouth.   omeprazole (PRILOSEC) 20 MG capsule Take 1 capsule (20 mg total) by mouth daily.   rosuvastatin (CRESTOR) 10 MG tablet Take  1 tablet (10 mg total) by mouth daily.   silodosin (RAPAFLO) 8 MG CAPS capsule Take 1 capsule (8 mg total) by mouth at bedtime.   sodium bicarbonate 650 MG tablet Take 650 mg by mouth 4 (four) times daily.   VITAMIN D PO Take by mouth.   [DISCONTINUED] atorvastatin (LIPITOR) 10 MG tablet Take 10 mg by mouth daily.   [DISCONTINUED] niacin-simvastatin (SIMCOR) 500-20 MG 24 hr tablet Take 1 tablet by mouth at bedtime.     No facility-administered encounter medications on file as of 11/23/2022.    Allergies (verified) Farxiga [dapagliflozin] and Kerendia [finerenone]   History: Past  Medical History:  Diagnosis Date   Acquired leg length discrepancy    Bladder stone    BPH associated with nocturia    CAD (coronary artery disease) CARDIOLOGIST- DR HOCHREIN--  LAST VISIT 09-27-2011 IN EPIC   Non obstructive Cath 2010-  HX CORONARY SPASM 2004   Cataract    bilateral    GERD (gastroesophageal reflux disease)    H/O hiatal hernia    Heart murmur    Heartburn    High cholesterol    History of nephrolithiasis 2009   Hx of adenomatous colonic polyps    Hyperlipidemia    Hypertension    Movement disorder    Multiple sclerosis (HCC) DX  10/10---  NEUROLOGIST  DR Gerlene Burdock [facility] ([place])   RIGHT SIDE AFFECTED MORE W/ WEAKNESS- - USES CANE   Nephrolithiasis 08/30/2020   Neuromuscular disorder (HCC)    MS   NIDDM (non-insulin dependent diabetes mellitus)    Peyronie's disease    RBBB    Renal stone RIGHT   Rosacea    Vision abnormalities    Past Surgical History:  Procedure Laterality Date   CARDIAC CATHETERIZATION  02-26-2009 ---  DR Julien Nordmann   NONOBSTRUCTIVE CAD/ 50% DISTAL RCA/ 30% LAD / NORMAL LVF   CYSTOSCOPY W/ RETROGRADES  10/30/2011   Procedure: CYSTOSCOPY WITH RETROGRADE PYELOGRAM;  Surgeon: Garnett Farm, MD;  Location: [facility];  Service: Urology;  Laterality: Right;   CYSTOSCOPY WITH LITHOLAPAXY  10/30/2011   Procedure: CYSTOSCOPY WITH LITHOLAPAXY;  Surgeon: Garnett Farm, MD;  Location: [facility];  Service: Urology;  Laterality: N/A;   CYSTOSCOPY WITH RETROGRADE PYELOGRAM, URETEROSCOPY AND STENT PLACEMENT Left 11/25/2020   Procedure: CYSTOSCOPY WITH LEFT RETROGRADE PYELOGRAM, LEFT URETEROSCOPY WITH LASER AND LEFT URETERAL STENT PLACEMENT;  Surgeon: Malen Gauze, MD;  Location: [facility];  Service: Urology;  Laterality: Left;   EXTRACORPOREAL SHOCK WAVE LITHOTRIPSY  09/05/2010   RIGHT   EYE SURGERY Right 08/30/2018   HOLMIUM LASER APPLICATION Left 11/25/2020   Procedure: HOLMIUM LASER APPLICATION;   Surgeon: Malen Gauze, MD;  Location: [facility];  Service: Urology;  Laterality: Left;   NESBIT PROCEDURE  10/17/2004   CORRECTION OF PENILE ANGULATION (PEYRONIES DISEASE)   UTI  06/17/2022   Family History  Problem Relation Age of Onset   Stroke Mother    Heart attack Mother    Diabetes Mother    Hypertension Mother    Renal cancer Father    Diabetes Sister    Heart attack Brother    Diabetes Son    Heart attack Maternal Grandfather    Heart attack Brother    Bladder Cancer Brother    Social History   Socioeconomic History   Marital status: Married    Spouse name: Jacob Fowler   Number of children: 2   Years of education:  13   Highest education level: Some college, no degree  Occupational History   Occupation: retired    Comment: self -employed farmer  Tobacco Use   Smoking status: Former    Packs/day: 1.00    Years: 7.00    Additional pack years: 0.00    Total pack years: 7.00    Types: Cigarettes    Quit date: 09/26/1973    Years since quitting: 49.1   Smokeless tobacco: Current    Types: Chew   Tobacco comments:    Chewing tobacco  Vaping Use   Vaping Use: Never used  Substance and Sexual Activity   Alcohol use: No    Alcohol/week: 0.0 standard drinks of alcohol   Drug use: No   Sexual activity: Not on file  Other Topics Concern   Not on file  Social History Narrative   Lives with wife, has MS, mobility issues.   Social Determinants of Health   Financial Resource Strain: Low Risk  (11/23/2022)   Overall Financial Resource Strain (CARDIA)    Difficulty of Paying Living Expenses: Not hard at all  Food Insecurity: No Food Insecurity (11/23/2022)   Hunger Vital Sign    Worried About Running Out of Food in the Last Year: Never true    Ran Out of Food in the Last Year: Never true  Transportation Needs: No Transportation Needs (11/23/2022)   PRAPARE - Administrator, Civil Service (Medical): No    Lack of Transportation (Non-Medical): No  Physical  Activity: Insufficiently Active (11/23/2022)   Exercise Vital Sign    Days of Exercise per Week: 1 day    Minutes of Exercise per Session: 10 min  Stress: No Stress Concern Present (11/23/2022)   [employer] of Occupational Health - Occupational Stress Questionnaire    Feeling of Stress : Not at all  Social Connections: Moderately Integrated (11/23/2022)   Social Connection and Isolation Panel [NHANES]    Frequency of Communication with Friends and Family: More than three times a week    Frequency of Social Gatherings with Friends and Family: More than three times a week    Attends Religious Services: More than 4 times per year    Active Member of [employer] or Organizations: No    Attends Engineer, structural: Never    Marital Status: Married    Tobacco Counseling Ready to quit: Not Answered Counseling given: Not Answered Tobacco comments: Chewing tobacco   Clinical Intake:  Pre-visit preparation completed: Yes  Pain : No/denies pain     Nutritional Risks: None Diabetes: No  How often do you need to have someone help you when you read instructions, pamphlets, or other written materials from your doctor or pharmacy?: 1 - Never  Diabetic?yes  Nutrition Risk Assessment:  Has the patient had any N/V/D within the last 2 months?  No  Does the patient have any non-healing wounds?  No  Has the patient had any unintentional weight loss or weight gain?  No   Diabetes:  Is the patient diabetic?  Yes  If diabetic, was a CBG obtained today?  No  Did the patient bring in their glucometer from home?  No  How often do you monitor your CBG's? Daily .   Financial Strains and Diabetes Management:  Are you having any financial strains with the device, your supplies or your medication? No .  Does the patient want to be seen by Chronic Care Management for management of their diabetes?  No  Would the patient like to be referred to a Nutritionist or for Diabetic Management?  No    Diabetic Exams:  Diabetic Eye Exam: Completed 10/2022 Diabetic Foot Exam: Overdue, Pt has been advised about the importance in completing this exam. Pt is scheduled for diabetic foot exam on next office visit .   Interpreter Needed?: No  Information entered by :: Renie Ora, LPN   Activities of Daily Living    11/23/2022   11:18 AM 06/19/2022    3:24 PM  In your present state of health, do you have any difficulty performing the following activities:  Hearing? 0   Vision? 0   Difficulty concentrating or making decisions? 0   Walking or climbing stairs? 0   Dressing or bathing? 0   Doing errands, shopping? 0 0  Preparing Food and eating ? N   Using the Toilet? N   In the past six months, have you accidently leaked urine? N   Do you have problems with loss of bowel control? N   Managing your Medications? N   Managing your Finances? N   Housekeeping or managing your Housekeeping? N     Patient Care Team: Bennie Pierini, FNP as PCP - General (Family Medicine) Sater, Pearletha Furl, MD (Neurology) Rollene Rotunda, MD as Consulting Physician (Cardiology) Delora Fuel, OD (Optometry) McKenzie, Mardene Celeste, MD as Consulting Physician (Urology)  Indicate any recent Medical Services you may have received from other than [facility] providers in the past year (date may be approximate).     Assessment:   This is a routine wellness examination for [place].  Hearing/Vision screen Vision Screening - Comments:: Wears rx glasses - up to date with routine eye exams with  Dr.Lee   Dietary issues and exercise activities discussed: Current Exercise Habits: Home exercise routine, Type of exercise: strength training/weights, Time (Minutes): 10, Frequency (Times/Week): 1, Weekly Exercise (Minutes/Week): 10, Intensity: Mild, Exercise limited by: neurologic condition(s)   Goals Addressed             This Visit's Progress    DIET - INCREASE WATER INTAKE   On track    Increase water intake  to 8 glasses per day - 64 oz.        Depression Screen    11/23/2022   11:17 AM 10/27/2022    3:37 PM 07/27/2022    4:03 PM 06/26/2022   10:36 AM 04/17/2022    2:34 PM 03/15/2022    5:03 PM 03/01/2022    2:14 PM  PHQ 2/9 Scores  PHQ - 2 Score 0 0 0 0 0 0 0  PHQ- 9 Score 0 0 0 0 0  0    Fall Risk    11/23/2022   11:15 AM 10/27/2022    3:37 PM 07/27/2022    4:03 PM 06/26/2022   10:35 AM 04/17/2022    2:34 PM  Fall Risk   Falls in the past year? 0 0 0 0 0  Number falls in past yr: 0      Injury with Fall? 0      Risk for fall due to : No Fall Risks      Follow up Falls prevention discussed        FALL RISK PREVENTION PERTAINING TO THE HOME:  Any stairs in or around the home? Yes  If so, are there any without handrails? No  Home free of loose throw rugs in walkways, pet beds, electrical cords, etc? Yes  Adequate lighting in your home  to reduce risk of falls? Yes   ASSISTIVE DEVICES UTILIZED TO PREVENT FALLS:  Life alert? No  Use of a cane, walker or w/c? Yes  Grab bars in the bathroom? Yes  Shower chair or bench in shower? Yes  Elevated toilet seat or a handicapped toilet? Yes       07/05/2017    8:36 AM  MMSE - Mini Mental State Exam  Orientation to time 5  Orientation to Place 5  Registration 3  Attention/ Calculation 5  Recall 2  Language- name 2 objects 2  Language- repeat 1  Language- follow 3 step command 3  Language- read & follow direction 1  Write a sentence 1  Copy design 1  Total score 29        11/23/2022   11:19 AM 11/21/2021   11:23 AM 11/18/2019    2:29 PM 11/12/2018    8:34 AM  6CIT Screen  What Year? 0 points 0 points 0 points 0 points  What month? 0 points 0 points 0 points 0 points  What time? 0 points 0 points 0 points 0 points  Count back from 20 0 points 0 points 0 points 0 points  Months in reverse 0 points 0 points 0 points 0 points  Repeat phrase 0 points 0 points 0 points 0 points  Total Score 0 points 0 points 0 points 0 points     Immunizations Immunization History  Administered Date(s) Administered   Fluad Quad(high Dose 65+) 04/25/2019, 05/10/2020, 06/01/2021, 04/17/2022   Influenza, High Dose Seasonal PF 04/10/2016, 05/17/2017, 06/10/2018   Influenza,inj,Quad PF,6+ Mos 05/14/2013, 05/27/2014, 07/05/2015   Pneumococcal Conjugate-13 05/27/2013   Pneumococcal Polysaccharide-23 04/15/2010, 10/04/2017   Td 02/16/2014   Tdap 02/16/2014    TDAP status: Up to date  Flu Vaccine status: Up to date  Pneumococcal vaccine status: Up to date  Covid-19 vaccine status: Completed vaccines  Qualifies for Shingles Vaccine? Yes   Zostavax completed No   Shingrix Completed?: No.    Education has been provided regarding the importance of this vaccine. Patient has been advised to call insurance company to determine out of pocket expense if they have not yet received this vaccine. Advised may also receive vaccine at local pharmacy or Health Dept. Verbalized acceptance and understanding.  Screening Tests Health Maintenance  Topic Date Due   COVID-19 Vaccine (1) Never done   Medicare Annual Wellness (AWV)  11/22/2022   Diabetic kidney evaluation - Urine ACR  12/06/2022   Hepatitis C Screening  12/01/2022 (Originally 01/10/1969)   COLONOSCOPY (Pts 45-38yrs Insurance coverage will need to be confirmed)  01/01/2023   INFLUENZA VACCINE  02/15/2023   HEMOGLOBIN A1C  04/28/2023   FOOT EXAM  07/28/2023   OPHTHALMOLOGY EXAM  10/16/2023   Diabetic kidney evaluation - eGFR measurement  10/27/2023   DTaP/Tdap/Td (3 - Td or Tdap) 02/17/2024   Pneumonia Vaccine 24+ Years old  Completed   HPV VACCINES  Aged Out   COLON CANCER SCREENING ANNUAL FOBT  Discontinued   Zoster Vaccines- Shingrix  Discontinued    Health Maintenance  Health Maintenance Due  Topic Date Due   COVID-19 Vaccine (1) Never done   Medicare Annual Wellness (AWV)  11/22/2022   Diabetic kidney evaluation - Urine ACR  12/06/2022    Colorectal cancer  screening: Type of screening: Colonoscopy. Completed 12/31/2017. Repeat every 5 years  Lung Cancer Screening: (Low Dose CT Chest recommended if Age 62-80 years, 30 pack-year currently smoking OR have quit w/in 15years.)  does not qualify.   Lung Cancer Screening Referral: n/a  Additional Screening:  Hepatitis C Screening: does qualify;  Vision Screening: Recommended annual ophthalmology exams for early detection of glaucoma and other disorders of the eye. Is the patient up to date with their annual eye exam?  Yes  Who is the provider or what is the name of the office in which the patient attends annual eye exams? Dr.Lee  If pt is not established with a provider, would they like to be referred to a provider to establish care? No .   Dental Screening: Recommended annual dental exams for proper oral hygiene  Community Resource Referral / Chronic Care Management: CRR required this visit?  No   CCM required this visit?  No      Plan:     I have personally reviewed and noted the following in the patient's chart:   Medical and social history Use of alcohol, tobacco or illicit drugs  Current medications and supplements including opioid prescriptions. Patient is not currently taking opioid prescriptions. Functional ability and status Nutritional status Physical activity Advanced directives List of other physicians Hospitalizations, surgeries, and ER visits in previous 12 months Vitals Screenings to include cognitive, depression, and falls Referrals and appointments  In addition, I have reviewed and discussed with patient certain preventive protocols, quality metrics, and best practice recommendations. A written personalized care plan for preventive services as well as general preventive health recommendations were provided to patient.     Lorrene Reid, LPN   08/26/5619   Nurse Notes: Wife Jacob Fowler assisted with MWV

## 2022-11-30 DIAGNOSIS — H2512 Age-related nuclear cataract, left eye: Secondary | ICD-10-CM | POA: Diagnosis not present

## 2022-12-10 DIAGNOSIS — N390 Urinary tract infection, site not specified: Secondary | ICD-10-CM | POA: Diagnosis not present

## 2022-12-13 DIAGNOSIS — H04123 Dry eye syndrome of bilateral lacrimal glands: Secondary | ICD-10-CM | POA: Diagnosis not present

## 2022-12-18 ENCOUNTER — Other Ambulatory Visit: Payer: Self-pay | Admitting: Nurse Practitioner

## 2022-12-18 DIAGNOSIS — N390 Urinary tract infection, site not specified: Secondary | ICD-10-CM | POA: Diagnosis not present

## 2022-12-18 DIAGNOSIS — N189 Chronic kidney disease, unspecified: Secondary | ICD-10-CM | POA: Diagnosis not present

## 2022-12-18 DIAGNOSIS — E039 Hypothyroidism, unspecified: Secondary | ICD-10-CM

## 2022-12-18 DIAGNOSIS — N1330 Unspecified hydronephrosis: Secondary | ICD-10-CM | POA: Diagnosis not present

## 2022-12-18 DIAGNOSIS — R809 Proteinuria, unspecified: Secondary | ICD-10-CM | POA: Diagnosis not present

## 2022-12-18 DIAGNOSIS — E1122 Type 2 diabetes mellitus with diabetic chronic kidney disease: Secondary | ICD-10-CM | POA: Diagnosis not present

## 2022-12-18 DIAGNOSIS — Z79899 Other long term (current) drug therapy: Secondary | ICD-10-CM | POA: Diagnosis not present

## 2022-12-18 DIAGNOSIS — N200 Calculus of kidney: Secondary | ICD-10-CM | POA: Diagnosis not present

## 2022-12-18 DIAGNOSIS — E871 Hypo-osmolality and hyponatremia: Secondary | ICD-10-CM | POA: Diagnosis not present

## 2022-12-18 DIAGNOSIS — D631 Anemia in chronic kidney disease: Secondary | ICD-10-CM | POA: Diagnosis not present

## 2022-12-18 MED ORDER — LEVOTHYROXINE SODIUM 75 MCG PO TABS
ORAL_TABLET | ORAL | 0 refills | Status: DC
Start: 2022-12-18 — End: 2023-01-26

## 2022-12-18 NOTE — Telephone Encounter (Signed)
Aware refill sent to pharmacy, appt on 01/26/23

## 2022-12-18 NOTE — Telephone Encounter (Signed)
  Prescription Request  12/18/2022  Is this a "Controlled Substance" medicine? no  Have you seen your PCP in the last 2 weeks? LOV 4/12 NOV 7/12   If YES, route message to pool  -  If NO, patient needs to be scheduled for appointment.  What is the name of the medication or equipment?  levothyroxine (SYNTHROID) 75 MCG tablet  Have you contacted your pharmacy to request a refill? Yes, switched pharmacy    Which pharmacy would you like this sent to?  [facility] - [place], [place] - [address] [place] [address]    Patient notified that their request is being sent to the clinical staff for review and that they should receive a response within 2 business days.

## 2022-12-19 ENCOUNTER — Other Ambulatory Visit: Payer: Self-pay

## 2022-12-19 DIAGNOSIS — E1165 Type 2 diabetes mellitus with hyperglycemia: Secondary | ICD-10-CM

## 2022-12-19 MED ORDER — LANTUS SOLOSTAR 100 UNIT/ML ~~LOC~~ SOPN: 55.0000 [IU] | PEN_INJECTOR | Freq: Every day | SUBCUTANEOUS | 11 refills | Status: DC

## 2022-12-25 ENCOUNTER — Ambulatory Visit (INDEPENDENT_AMBULATORY_CARE_PROVIDER_SITE_OTHER): Payer: PPO | Admitting: Urology

## 2022-12-25 ENCOUNTER — Encounter: Payer: Self-pay | Admitting: Urology

## 2022-12-25 VITALS — BP 120/74 | HR 82

## 2022-12-25 DIAGNOSIS — N138 Other obstructive and reflux uropathy: Secondary | ICD-10-CM | POA: Diagnosis not present

## 2022-12-25 DIAGNOSIS — R319 Hematuria, unspecified: Secondary | ICD-10-CM

## 2022-12-25 DIAGNOSIS — N390 Urinary tract infection, site not specified: Secondary | ICD-10-CM

## 2022-12-25 DIAGNOSIS — N401 Enlarged prostate with lower urinary tract symptoms: Secondary | ICD-10-CM | POA: Diagnosis not present

## 2022-12-25 DIAGNOSIS — R351 Nocturia: Secondary | ICD-10-CM | POA: Diagnosis not present

## 2022-12-25 DIAGNOSIS — G35 Multiple sclerosis: Secondary | ICD-10-CM | POA: Diagnosis not present

## 2022-12-25 DIAGNOSIS — E8722 Chronic metabolic acidosis: Secondary | ICD-10-CM | POA: Diagnosis not present

## 2022-12-25 DIAGNOSIS — R338 Other retention of urine: Secondary | ICD-10-CM | POA: Diagnosis not present

## 2022-12-25 DIAGNOSIS — E1122 Type 2 diabetes mellitus with diabetic chronic kidney disease: Secondary | ICD-10-CM | POA: Diagnosis not present

## 2022-12-25 LAB — URINALYSIS, ROUTINE W REFLEX MICROSCOPIC
Bilirubin, UA: NEGATIVE
Ketones, UA: NEGATIVE
Nitrite, UA: NEGATIVE
Specific Gravity, UA: <1.005 — ABNORMAL LOW (ref 1.005–1.030)
Urobilinogen, Ur: 0.2 mg/dL (ref 0.2–1.0)
pH, UA: 6.5 (ref 5.0–7.5)

## 2022-12-25 LAB — MICROSCOPIC EXAMINATION
Bacteria, UA: NONE SEEN
RBC, Urine: >30 /hpf — AB (ref 0–2)
WBC, UA: >30 /hpf — AB (ref 0–5)

## 2022-12-25 MED ORDER — DOXYCYCLINE HYCLATE 100 MG PO CAPS
100.0000 mg | ORAL_CAPSULE | Freq: Two times a day (BID) | ORAL | 0 refills | Status: DC
Start: 2022-12-25 — End: 2022-12-27

## 2022-12-25 NOTE — Progress Notes (Unsigned)
MDX# [id number]

## 2022-12-25 NOTE — Progress Notes (Unsigned)
12/25/2022 9:37 AM   Jacob Fowler 02/06/1951 [id number]  Referring provider: Bennie Pierini, FNP [address] [place],  [place] [address]  Followup recurrent UTI and BPH   HPI: Jacob Fowler is a 72yo here for followup for BPH and recurrent UTI. He has had several UTIs since last visit. He is on macrobid prophylaxis. IPSS 8 QOL 2 rapaflo 8mg . Nocturia 1-2x. Urine stream fair. No straining to urinate.    PMH: Past Medical History:  Diagnosis Date   Acquired leg length discrepancy    Bladder stone    BPH associated with nocturia    CAD (coronary artery disease) CARDIOLOGIST- DR HOCHREIN--  LAST VISIT 09-27-2011 IN EPIC   Non obstructive Cath 2010-  HX CORONARY SPASM 2004   Cataract    bilateral    GERD (gastroesophageal reflux disease)    H/O hiatal hernia    Heart murmur    Heartburn    High cholesterol    History of nephrolithiasis 2009   Hx of adenomatous colonic polyps    Hyperlipidemia    Hypertension    Movement disorder    Multiple sclerosis (HCC) DX  10/10---  NEUROLOGIST  DR Gerlene Burdock [facility] ([place])   RIGHT SIDE AFFECTED MORE W/ WEAKNESS- - USES CANE   Nephrolithiasis 08/30/2020   Neuromuscular disorder (HCC)    MS   NIDDM (non-insulin dependent diabetes mellitus)    Peyronie's disease    RBBB    Renal stone RIGHT   Rosacea    Vision abnormalities     Surgical History: Past Surgical History:  Procedure Laterality Date   CARDIAC CATHETERIZATION  02-26-2009 ---  DR Julien Nordmann   NONOBSTRUCTIVE CAD/ 50% DISTAL RCA/ 30% LAD / NORMAL LVF   CYSTOSCOPY W/ RETROGRADES  10/30/2011   Procedure: CYSTOSCOPY WITH RETROGRADE PYELOGRAM;  Surgeon: Garnett Farm, MD;  Location: [facility] [place];  Service: Urology;  Laterality: Right;   CYSTOSCOPY WITH LITHOLAPAXY  10/30/2011   Procedure: CYSTOSCOPY WITH LITHOLAPAXY;  Surgeon: Garnett Farm, MD;  Location: [facility];  Service: Urology;  Laterality: N/A;   CYSTOSCOPY  WITH RETROGRADE PYELOGRAM, URETEROSCOPY AND STENT PLACEMENT Left 11/25/2020   Procedure: CYSTOSCOPY WITH LEFT RETROGRADE PYELOGRAM, LEFT URETEROSCOPY WITH LASER AND LEFT URETERAL STENT PLACEMENT;  Surgeon: Malen Gauze, MD;  Location: [facility];  Service: Urology;  Laterality: Left;   EXTRACORPOREAL SHOCK WAVE LITHOTRIPSY  09/05/2010   RIGHT   EYE SURGERY Right 08/30/2018   HOLMIUM LASER APPLICATION Left 11/25/2020   Procedure: HOLMIUM LASER APPLICATION;  Surgeon: Malen Gauze, MD;  Location: [facility];  Service: Urology;  Laterality: Left;   NESBIT PROCEDURE  10/17/2004   CORRECTION OF PENILE ANGULATION (PEYRONIES DISEASE)   UTI  06/17/2022    Home Medications:  Allergies as of 12/25/2022       Reactions   Farxiga [dapagliflozin] Other (See Comments)   unable to take Marcelline Deist due to recurrent UTIs, urinary retention and hydronephrosis   Kerendia [finerenone] Other (See Comments)   hyperkalemia        Medication List        Accurate as of December 25, 2022  9:37 AM. If you have any questions, ask your nurse or doctor.          aspirin EC 81 MG tablet Take 81 mg by mouth daily.   Calmoseptine 0.44-20.6 % Oint Generic drug: Menthol-Zinc Oxide Apply small amount o affected are BID   ciprofloxacin 500 MG tablet Commonly known  as: Cipro Take 1 tablet (500 mg total) by mouth 2 (two) times daily. For prostate. Take all of these.   cyanocobalamin 1000 MCG/ML injection Commonly known as: VITAMIN B12 INJECT INTO THE SKIN ONCE A MONTH   ferrous sulfate 325 (65 FE) MG tablet Take 1 tablet (325 mg total) by mouth daily with breakfast.   finasteride 5 MG tablet Commonly known as: PROSCAR Take 1 tablet (5 mg total) by mouth daily.   glucose blood test strip Use as instructed   insulin glargine 100 unit/mL Sopn Commonly known as: LANTUS Inject 30 Units into the skin daily.   insulin glargine 100 UNIT/ML injection Commonly known as: Lantus Inject 0.55 mLs (55  Units total) into the skin daily.   Lantus SoloStar 100 UNIT/ML Solostar Pen Generic drug: insulin glargine Inject 55 Units into the skin daily.   Insulin Pen Needle 31G X 5 MM Misc Use with lantus pen as directed   isosorbide mononitrate 60 MG 24 hr tablet Commonly known as: IMDUR TAKE 1/2 TABLET (30MG  TOTAL) BY MOUTH DAILY   levothyroxine 75 MCG tablet Commonly known as: SYNTHROID TAKE ONE TABLET ( TOTAL) BY MOUTH DAILY BEFORE BREAKFAST   nitrofurantoin 50 MG capsule Commonly known as: Macrodantin Take 1 capsule (50 mg total) by mouth at bedtime.   olmesartan 5 MG tablet Commonly known as: BENICAR Take by mouth.   omeprazole 20 MG capsule Commonly known as: PRILOSEC Take 1 capsule (20 mg total) by mouth daily.   rosuvastatin 10 MG tablet Commonly known as: Crestor Take 1 tablet (10 mg total) by mouth daily.   silodosin 8 MG Caps capsule Commonly known as: RAPAFLO Take 1 capsule (8 mg total) by mouth at bedtime.   sodium bicarbonate 650 MG tablet Take 650 mg by mouth 4 (four) times daily.   VITAMIN C PO Take by mouth.   Vitamin D 50 MCG (2000 UT) tablet Take 2,000 Units by mouth daily.   VITAMIN D PO Take by mouth.        Allergies:  Allergies  Allergen Reactions   Farxiga [Dapagliflozin] Other (See Comments)    unable to take Farxiga due to recurrent UTIs, urinary retention and hydronephrosis    Chauncey Mann [Finerenone] Other (See Comments)    hyperkalemia    Family History: Family History  Problem Relation Age of Onset   Stroke Mother    Heart attack Mother    Diabetes Mother    Hypertension Mother    Renal cancer Father    Diabetes Sister    Heart attack Brother    Diabetes Son    Heart attack Maternal Grandfather    Heart attack Brother    Bladder Cancer Brother     Social History:  reports that he quit smoking about 49 years ago. His smoking use included cigarettes. He has a 7.00 pack-year smoking history. His smokeless tobacco  use includes chew. He reports that he does not drink alcohol and does not use drugs.  ROS: All other review of systems were reviewed and are negative except what is noted above in HPI  Physical Exam: BP 120/74   Pulse 82   Constitutional:  Alert and oriented, No acute distress. HEENT:  AT, moist mucus membranes.  Trachea midline, no masses. Cardiovascular: No clubbing, cyanosis, or edema. Respiratory: Normal respiratory effort, no increased work of breathing. GI: Abdomen is soft, nontender, nondistended, no abdominal masses GU: No CVA tenderness.  Lymph: No cervical or inguinal lymphadenopathy. Skin: No rashes, bruises or  suspicious lesions. Neurologic: Grossly intact, no focal deficits, moving all 4 extremities. Psychiatric: Normal mood and affect.  Laboratory Data: Lab Results  Component Value Date   WBC 9.6 10/27/2022   HGB 11.5 (L) 10/27/2022   HCT 35.4 (L) 10/27/2022   MCV 87 10/27/2022   PLT 279 10/27/2022    Lab Results  Component Value Date   CREATININE 2.47 (H) 10/27/2022    Lab Results  Component Value Date   PSA 5.5 (H) 01/28/2014   PSA 3.37 01/06/2013   PSA 3.08 10/14/2012    No results found for: "TESTOSTERONE"  Lab Results  Component Value Date   HGBA1C 11.5 (H) 10/27/2022    Urinalysis    Component Value Date/Time   COLORURINE YELLOW 06/17/2022 1140   APPEARANCEUR Cloudy (A) 11/09/2022 1310   LABSPEC 1.013 06/17/2022 1140   PHURINE 7.0 06/17/2022 1140   GLUCOSEU 3+ (A) 11/09/2022 1310   HGBUR SMALL (A) 06/17/2022 1140   BILIRUBINUR Negative 11/09/2022 1310   KETONESUR NEGATIVE 06/17/2022 1140   PROTEINUR 2+ (A) 11/09/2022 1310   PROTEINUR 100 (A) 06/17/2022 1140   UROBILINOGEN negative 12/18/2014 1601   UROBILINOGEN 1.0 04/06/2010 0509   NITRITE Negative 11/09/2022 1310   NITRITE NEGATIVE 06/17/2022 1140   LEUKOCYTESUR 3+ (A) 11/09/2022 1310   LEUKOCYTESUR LARGE (A) 06/17/2022 1140    Lab Results  Component Value Date   LABMICR  See below: 10/25/2022   WBCUA >30 (A) 10/25/2022   RBCUA >30 (A) 08/07/2018   LABEPIT 0-10 10/25/2022   MUCUS Present (A) 10/25/2022   BACTERIA Many (A) 10/25/2022    Pertinent Imaging:  Results for orders placed during the hospital encounter of 10/30/11  KUB day of procedure  Narrative *RADIOLOGY REPORT*  Clinical Data: Right ureteral stone  ABDOMEN - 1 VIEW  Comparison: [facility] CT urogram dated 10/17/2011  Findings: Prior distal right ureteral calculus is not definitely visualized on the current radiograph.  Known nonobstructing right lower pole calculi are also not radiographically apparent.  Calcified phleboliths in the pelvis.  Linear calcification overlying the right sacrum corresponds to a vascular calcification.  Suspected radiopaque ingested tablets in the splenic flexure, sigmoid colon (overlying superior bladder), and cecum.  Degenerative changes of the visualized thoracolumbar spine.  IMPRESSION: Prior distal right ureteral calculus is not definitely visualized on the current radiograph.  Known nonobstructing right lower pole calculi are also not radiographically apparent.  Original Report Authenticated By: Charline Bills, M.D.  No results found for this or any previous visit.  No results found for this or any previous visit.  No results found for this or any previous visit.  Results for orders placed during the hospital encounter of 02/01/22  US RENAL  Narrative CLINICAL DATA:  Kidney stone.  EXAM: RENAL / URINARY TRACT ULTRASOUND COMPLETE  COMPARISON:  Renal ultrasound last month 12/16/2021, CT 01/13/2021  FINDINGS: Right Kidney:  Renal measurements: 9.9 x 5.4 x 5.9 cm = volume: 163 mL. Moderate hydronephrosis which was not seen on prior ultrasound. Possible 8 mm nonobstructing stone in the lower kidney, also not seen on prior ultrasound. No evidence of focal renal lesion.  Left Kidney:  Renal measurements: 9.8 x 4.9 x  5.7 cm = volume: 142 mL. Similar degree of mild to moderate hydronephrosis. No visualized renal stone or focal lesion.  Bladder:  Appears normal for degree of bladder distention. Both ureteral jets are demonstrated.  Other:  None.  IMPRESSION: 1. Moderate right hydronephrosis, new from ultrasound last month. Possible nonobstructing right  renal calculus. 2. Similar degree of mild to moderate left hydronephrosis.   Electronically Signed By: Narda Rutherford M.D. On: 02/02/2022 00:18  No valid procedures specified. No results found for this or any previous visit.  Results for orders placed in visit on 03/22/22  CT RENAL STONE STUDY  Narrative CLINICAL DATA:  Nephrolithiasis. Bilateral hydronephrosis seen on recent ultrasound.  EXAM: CT ABDOMEN AND PELVIS WITHOUT CONTRAST  TECHNIQUE: Multidetector CT imaging of the abdomen and pelvis was performed following the standard protocol without IV contrast.  RADIATION DOSE REDUCTION: This exam was performed according to the departmental dose-optimization program which includes automated exposure control, adjustment of the mA and/or kV according to patient size and/or use of iterative reconstruction technique.  COMPARISON:  Ultrasound on 02/01/2022 and noncontrast CT on 01/14/2019  FINDINGS: Lower chest: No acute findings.  Hepatobiliary: No mass visualized on this unenhanced exam. Gallbladder is unremarkable. No evidence of biliary ductal dilatation.  Pancreas: No mass or inflammatory process visualized on this unenhanced exam.  Spleen:  Within normal limits in size.  Adrenals/Urinary tract: A few tiny 1-2 mm right renal calculi are again seen. Left ureteral stent has been removed since prior exam. Mild-to-moderate bilateral hydroureteronephrosis is seen to the level of the urinary bladder, however there is no evidence of ureteral calculi. Stable mild diffuse bladder wall thickening.  Stomach/Bowel: Small hiatal  hernia again noted. No evidence of obstruction, inflammatory process, or abnormal fluid collections. Normal appendix visualized.  Vascular/Lymphatic: No pathologically enlarged lymph nodes identified. No evidence of abdominal aortic aneurysm. Aortic atherosclerotic calcification incidentally noted.  Reproductive:  No mass or other significant abnormality.  Other:  None.  Musculoskeletal:  No suspicious bone lesions identified.  IMPRESSION: Mild-to-moderate bilateral hydroureteronephrosis to the level of the urinary bladder. No evidence of ureteral calculi. This raises suspicion for vesicoureteral reflux.  Stable mild diffuse bladder wall thickening, which may be due to chronic bladder outlet obstruction or cystitis.  Tiny nonobstructing right renal calculi.  Small hiatal hernia.   Electronically Signed By: Danae Orleans M.D. On: 05/02/2022 12:09   Assessment & Plan:    1. Urinary tract infection with hematuria, site unspecified -CT stone study -doxycycline 100mg  BID for 7 days MDX culture - Urinalysis, Routine w reflex microscopic  2. Benign prostatic hyperplasia with urinary obstruction -continue rapaflo 8mg   - BLADDER SCAN AMB NON-IMAGING  3. Nocturia Continue rapaflo 8mg  daily   No follow-ups on file.  Wilkie Aye, MD  [facility] [place]

## 2022-12-26 ENCOUNTER — Encounter: Payer: Self-pay | Admitting: Urology

## 2022-12-26 NOTE — Patient Instructions (Signed)

## 2022-12-27 ENCOUNTER — Telehealth: Payer: Self-pay

## 2022-12-27 MED ORDER — SULFAMETHOXAZOLE-TRIMETHOPRIM 800-160 MG PO TABS: 1.0000 | ORAL_TABLET | Freq: Two times a day (BID) | ORAL | 0 refills | Status: DC

## 2022-12-27 NOTE — Telephone Encounter (Signed)
MDX urine culture reviewed with Dr. Ronne Binning.  New order for bactrim DS BID x 7 days received. New rx sent to pharmacy. I spoke with wife. Voiced understanding of new antibiotic and to stop doxycycline

## 2022-12-28 LAB — URINE CULTURE

## 2023-01-01 ENCOUNTER — Ambulatory Visit (HOSPITAL_COMMUNITY)
Admission: RE | Admit: 2023-01-01 | Discharge: 2023-01-01 | Disposition: A | Discharge status: Home / Self Care | Payer: PPO | Source: Ambulatory Visit | Attending: Urology | Admitting: Urology

## 2023-01-01 DIAGNOSIS — R319 Hematuria, unspecified: Secondary | ICD-10-CM | POA: Diagnosis not present

## 2023-01-01 DIAGNOSIS — N1330 Unspecified hydronephrosis: Secondary | ICD-10-CM | POA: Diagnosis not present

## 2023-01-01 DIAGNOSIS — N3289 Other specified disorders of bladder: Secondary | ICD-10-CM | POA: Diagnosis not present

## 2023-01-01 DIAGNOSIS — K449 Diaphragmatic hernia without obstruction or gangrene: Secondary | ICD-10-CM | POA: Diagnosis not present

## 2023-01-01 DIAGNOSIS — N390 Urinary tract infection, site not specified: Secondary | ICD-10-CM | POA: Diagnosis not present

## 2023-01-02 ENCOUNTER — Telehealth: Payer: Self-pay

## 2023-01-02 NOTE — Telephone Encounter (Signed)
-----   Message from Malen Gauze, MD sent at 01/02/2023  9:51 AM EDT ----- Continue bactrim ----- Message ----- From: Grier Rocher, CMA Sent: 12/28/2022   9:42 AM EDT To: Malen Gauze, MD  Patient started on Bactrim on 06/12

## 2023-01-02 NOTE — Telephone Encounter (Signed)
Wife aware of MD response to urine culture.

## 2023-01-03 ENCOUNTER — Other Ambulatory Visit: Payer: Self-pay | Admitting: Nurse Practitioner

## 2023-01-03 DIAGNOSIS — E1165 Type 2 diabetes mellitus with hyperglycemia: Secondary | ICD-10-CM

## 2023-01-03 DIAGNOSIS — E1169 Type 2 diabetes mellitus with other specified complication: Secondary | ICD-10-CM

## 2023-01-04 ENCOUNTER — Telehealth: Payer: Self-pay | Admitting: Nurse Practitioner

## 2023-01-04 DIAGNOSIS — E1159 Type 2 diabetes mellitus with other circulatory complications: Secondary | ICD-10-CM

## 2023-01-04 MED ORDER — ISOSORBIDE MONONITRATE ER 60 MG PO TB24
ORAL_TABLET | ORAL | 0 refills | Status: DC
Start: 2023-01-04 — End: 2023-01-26

## 2023-01-04 NOTE — Telephone Encounter (Signed)
One refill of Isosorbide sent to pts pharmacy. Left message making pt aware.

## 2023-01-04 NOTE — Telephone Encounter (Signed)
  Prescription Request  01/04/2023  Is this a "Controlled Substance" medicine? NO  Have you seen your PCP in the last 2 weeks? NO  If YES, route message to pool  -  If NO, patient needs to be scheduled for appointment.  What is the name of the medication or equipment? Isosorbide mononitrate 60 mg  Have you contacted your pharmacy to request a refill? NO   Which pharmacy would you like this sent to? [facility] in [place]   Patient notified that their request is being sent to the clinical staff for review and that they should receive a response within 2 business days.

## 2023-01-08 ENCOUNTER — Encounter: Payer: Self-pay | Admitting: Urology

## 2023-01-16 NOTE — Progress Notes (Signed)
Name: Jacob Fowler DOB: [birth year] MRN: [id number]  History of Present Illness: Mr. Capel is a 72 y.o. male who presents today for return patient visit at [facility]. He is accompanied by his wife and daughter Marin Comment). - GU history: 1. 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  - Taking Rapaflo 8 mg daily and Proscar 5 mg daily. 2. Recurrent UTls. Taking daily low dose Nitrofurantoin for UTI prophylaxis. 3. Bladder stone. - Taking sodium bicarb and citrate solution for stone prevention as per Dr. Wolfgang Phoenix (nephrology).  4. Persistent bilateral hydronephrosis.  - 10/23/2022: Creatinine 2.15; GFR 32.  Urine culture results in past 6 months: - 08/11/2022: Positive for Citrobacter freundii - 08/30/2022: Positive for Beta hemolytic Streptococcus, group B  - 09/20/2022: Positive for Beta hemolytic Streptococcus, group B  - 10/25/2022: Positive for Klebsiella pneumoniae - 11/09/2022: Negative - 12/25/2022: Positive for Serratia marcescens  PSA values: - 08/07/2018: 4.4 - 12/25/2018: 5.1 - 04/25/2019: 5.0 - 08/27/2019: 4.4 - 12/25/2019: 4.8 - 10/08/2020: 5.3  At last visit with Dr. Ronne Binning on 12/25/2022: Seen for UTI follow up. The plan was: - MDX culture - Doxycycline 100mg  BID for 7 days - CT stone study  Since last visit: - 12/27/2022: Bactrim DS BID x 7 days prescribed by Dr. Ronne Binning based on MDX urine culture results from 12/25/2022 (positive for Candida albicans, E. coli, Serratia marcescens). - 01/01/2023: CT stone study showed "Moderate bilateral hydronephrosis, unchanged, without definitive cause identified. Bladder is moderately distended. No urinary stones.Marland KitchenMarland KitchenProstate is normal in size."  Today: He reports feeling well. He denies urinary hesitancy, urgency, frequency, gross hematuria, straining to void, or sensations of incomplete emptying. He reports mild dysuria. Denies fevers, nausea, vomiting, flank pain, or abdominal  pain.    Fall Screening: Do you usually have a device to assist in your mobility? Yes - wheelchair   Medications: Current Outpatient Medications  Medication Sig Dispense Refill   Ascorbic Acid (VITAMIN C PO) Take by mouth.     aspirin 81 MG EC tablet Take 81 mg by mouth daily.     Cholecalciferol (VITAMIN D) 50 MCG (2000 UT) tablet Take 2,000 Units by mouth daily.     cyanocobalamin (VITAMIN B12) 1000 MCG/ML injection INJECT INTO THE SKIN ONCE A MONTH 3 mL 5   ferrous sulfate 325 (65 FE) MG tablet Take 1 tablet (325 mg total) by mouth daily with breakfast. 90 tablet 1   finasteride (PROSCAR) 5 MG tablet Take 1 tablet (5 mg total) by mouth daily. 90 tablet 3   glucose blood test strip Use as instructed 100 each 12   insulin glargine (LANTUS SOLOSTAR) 100 UNIT/ML Solostar Pen Inject 55 Units into the skin daily. 30 mL 11   insulin glargine (LANTUS) 100 UNIT/ML injection Inject 0.55 mLs (55 Units total) into the skin daily. 50 mL 0   insulin glargine (LANTUS) 100 unit/mL SOPN Inject 30 Units into the skin daily. 15 mL 11   Insulin Pen Needle 31G X 5 MM MISC Use with lantus pen as directed 100 each 2   isosorbide mononitrate (IMDUR) 60 MG 24 hr tablet TAKE 1/2 TABLET (30MG  TOTAL) BY MOUTH DAILY 45 tablet 0   levothyroxine (SYNTHROID) 75 MCG tablet TAKE ONE TABLET ( TOTAL) BY MOUTH DAILY BEFORE BREAKFAST 90 tablet 0   Menthol-Zinc Oxide (CALMOSEPTINE) 0.44-20.6 % OINT Apply small amount o affected are BID 113 g 2   mirabegron ER (MYRBETRIQ) 25 MG TB24 tablet Take 1 tablet (25  mg total) by mouth daily. 30 tablet 5   nitrofurantoin (MACRODANTIN) 50 MG capsule Take 1 capsule (50 mg total) by mouth at bedtime. 30 capsule 11   olmesartan (BENICAR) 5 MG tablet Take by mouth.     omeprazole (PRILOSEC) 20 MG capsule Take 1 capsule (20 mg total) by mouth daily. 90 capsule 1   rosuvastatin (CRESTOR) 10 MG tablet TAKE ONE TABLET BY MOUTH EVERY DAY 90 tablet 0   silodosin (RAPAFLO) 8 MG CAPS  capsule Take 1 capsule (8 mg total) by mouth at bedtime. 90 capsule 3   sodium bicarbonate 650 MG tablet Take 650 mg by mouth 4 (four) times daily.     sulfamethoxazole-trimethoprim (BACTRIM DS) 800-160 MG tablet Take 1 tablet by mouth every 12 (twelve) hours. 14 tablet 0   VITAMIN D PO Take by mouth.     No current facility-administered medications for this visit.    Allergies: Allergies  Allergen Reactions   Farxiga [Dapagliflozin] Other (See Comments)    unable to take Farxiga due to recurrent UTIs, urinary retention and hydronephrosis    Chauncey Mann [Finerenone] Other (See Comments)    hyperkalemia    Past Medical History:  Diagnosis Date   Acquired leg length discrepancy    Bladder stone    BPH associated with nocturia    CAD (coronary artery disease) CARDIOLOGIST- DR HOCHREIN--  LAST VISIT 09-27-2011 IN EPIC   Non obstructive Cath 2010-  HX CORONARY SPASM 2004   Cataract    bilateral    GERD (gastroesophageal reflux disease)    H/O hiatal hernia    Heart murmur    Heartburn    High cholesterol    History of nephrolithiasis 2009   Hx of adenomatous colonic polyps    Hyperlipidemia    Hypertension    Movement disorder    Multiple sclerosis (HCC) DX  10/10---  NEUROLOGIST  DR Gerlene Burdock [facility] ([place])   RIGHT SIDE AFFECTED MORE W/ WEAKNESS- - USES CANE   Nephrolithiasis 08/30/2020   Neuromuscular disorder (HCC)    MS   NIDDM (non-insulin dependent diabetes mellitus)    Peyronie's disease    RBBB    Renal stone RIGHT   Rosacea    Vision abnormalities    Past Surgical History:  Procedure Laterality Date   CARDIAC CATHETERIZATION  02-26-2009 ---  DR Julien Nordmann   NONOBSTRUCTIVE CAD/ 50% DISTAL RCA/ 30% LAD / NORMAL LVF   CYSTOSCOPY W/ RETROGRADES  10/30/2011   Procedure: CYSTOSCOPY WITH RETROGRADE PYELOGRAM;  Surgeon: Garnett Farm, MD;  Location: [facility] [place];  Service: Urology;  Laterality: Right;   CYSTOSCOPY WITH LITHOLAPAXY  10/30/2011    Procedure: CYSTOSCOPY WITH LITHOLAPAXY;  Surgeon: Garnett Farm, MD;  Location: [facility];  Service: Urology;  Laterality: N/A;   CYSTOSCOPY WITH RETROGRADE PYELOGRAM, URETEROSCOPY AND STENT PLACEMENT Left 11/25/2020   Procedure: CYSTOSCOPY WITH LEFT RETROGRADE PYELOGRAM, LEFT URETEROSCOPY WITH LASER AND LEFT URETERAL STENT PLACEMENT;  Surgeon: Malen Gauze, MD;  Location: [facility];  Service: Urology;  Laterality: Left;   EXTRACORPOREAL SHOCK WAVE LITHOTRIPSY  09/05/2010   RIGHT   EYE SURGERY Right 08/30/2018   HOLMIUM LASER APPLICATION Left 11/25/2020   Procedure: HOLMIUM LASER APPLICATION;  Surgeon: Malen Gauze, MD;  Location: [facility];  Service: Urology;  Laterality: Left;   NESBIT PROCEDURE  10/17/2004   CORRECTION OF PENILE ANGULATION (PEYRONIES DISEASE)   UTI  06/17/2022   Family History  Problem Relation Age of Onset  Stroke Mother    Heart attack Mother    Diabetes Mother    Hypertension Mother    Renal cancer Father    Diabetes Sister    Heart attack Brother    Diabetes Son    Heart attack Maternal Grandfather    Heart attack Brother    Bladder Cancer Brother    Social History   Socioeconomic History   Marital status: Married    Spouse name: Steward Drone   Number of children: 2   Years of education: 13   Highest education level: Some college, no degree  Occupational History   Occupation: retired    Comment: self -employed farmer  Tobacco Use   Smoking status: Former    Packs/day: 1.00    Years: 7.00    Additional pack years: 0.00    Total pack years: 7.00    Types: Cigarettes    Quit date: 09/26/1973    Years since quitting: 49.3   Smokeless tobacco: Current    Types: Chew   Tobacco comments:    Chewing tobacco  Vaping Use   Vaping Use: Never used  Substance and Sexual Activity   Alcohol use: No    Alcohol/week: 0.0 standard drinks of alcohol   Drug use: No   Sexual activity: Not Currently  Other Topics Concern   Not on file   Social History Narrative   Lives with wife, has MS, mobility issues.   Social Determinants of Health   Financial Resource Strain: Low Risk  (11/23/2022)   Overall Financial Resource Strain (CARDIA)    Difficulty of Paying Living Expenses: Not hard at all  Food Insecurity: No Food Insecurity (11/23/2022)   Hunger Vital Sign    Worried About Running Out of Food in the Last Year: Never true    Ran Out of Food in the Last Year: Never true  Transportation Needs: No Transportation Needs (11/23/2022)   PRAPARE - Administrator, Civil Service (Medical): No    Lack of Transportation (Non-Medical): No  Physical Activity: Insufficiently Active (11/23/2022)   Exercise Vital Sign    Days of Exercise per Week: 1 day    Minutes of Exercise per Session: 10 min  Stress: No Stress Concern Present (11/23/2022)   [employer] of Occupational Health - Occupational Stress Questionnaire    Feeling of Stress : Not at all  Social Connections: Moderately Integrated (11/23/2022)   Social Connection and Isolation Panel [NHANES]    Frequency of Communication with Friends and Family: More than three times a week    Frequency of Social Gatherings with Friends and Family: More than three times a week    Attends Religious Services: More than 4 times per year    Active Member of [employer] or Organizations: No    Attends Banker Meetings: Never    Marital Status: Married  Catering manager Violence: Not At Risk (11/23/2022)   Humiliation, Afraid, Rape, and Kick questionnaire    Fear of Current or Ex-Partner: No    Emotionally Abused: No    Physically Abused: No    Sexually Abused: No    Review of Systems Constitutional: Patient denies any unintentional weight loss or change in strength lntegumentary: Patient denies any rashes or pruritus Eyes: Patient denies dry eyes ENT: Patient denies dry mouth Cardiovascular: Patient denies chest pain or syncope Respiratory: Patient denies shortness of  breath Gastrointestinal: Patient denies nausea, vomiting, constipation, or diarrhea Musculoskeletal: Patient denies muscle cramps or weakness Neurologic: Patient denies convulsions  or seizures Psychiatric: Patient denies memory problems Allergic/Immunologic: Patient denies recent allergic reaction(s) Hematologic/Lymphatic: Patient denies bleeding tendencies Endocrine: Patient denies heat/cold intolerance  GU: As per HPI.  OBJECTIVE Vitals:   01/22/23 0908  BP: 100/65  Pulse: 87  Temp: 97.8 F (36.6 C)   Body mass index is 25.02 kg/m.  Physical Examination  Constitutional: No obvious distress; patient is non-toxic appearing  Cardiovascular: No visible lower extremity edema.  Respiratory: The patient does not have audible wheezing/stridor; respirations do not appear labored  Gastrointestinal: Abdomen non-distended Musculoskeletal: Normal ROM of UEs  Skin: No obvious rashes/open sores  Neurologic: CN 2-12 grossly intact Psychiatric: Answered questions appropriately with normal affect  Hematologic/Lymphatic/Immunologic: No obvious bruises or sites of spontaneous bleeding  UA: positive for >30 WBC/hpf, 11-30 RBC/hpf, bacteria (moderate) PVR: 171 ml  ASSESSMENT BPH with obstruction/lower urinary tract symptoms - Plan: BLADDER SCAN AMB NON-IMAGING, Urinalysis, Routine w reflex microscopic  Recurrent UTI - Plan: US RENAL, sulfamethoxazole-trimethoprim (BACTRIM DS) 800-160 MG tablet  History of bladder stone  Incomplete bladder emptying - Plan: mirabegron ER (MYRBETRIQ) 25 MG TB24 tablet, US RENAL  Multiple sclerosis (HCC) - Plan: mirabegron ER (MYRBETRIQ) 25 MG TB24 tablet, US RENAL  We reviewed / discussed recent history, labs, and imaging results.   UA abnormal; patient mildly symptomatic for UTI (dysuria). Will check urine culture. Will treat empirically with Bactrim while awaiting culture results and sensitivities. - We discussed that urine color, clarity, and odor are  not clinical indicators of UTI. Urine testing / cultures are not advised in the absence of acute UTI symptoms given that acute antibiotic treatment would not be indicated regardless of the findings. - We discussed antibiotic stewardship and goal to minimize risk for developing antibiotic resistance. - Will continue daily low dose Nitrofurantoin for UTI prophylaxis.   Consulted with Dr. Ronne Binning regarding patient's persistent bilateral moderate hydronephrosis >1 year in setting of incomplete bladder emptying and CKD stage 3. Suspect neurogenic bladder with poor detrusor compliance secondary to patient's multiple sclerosis. Dr. Ronne Binning advised starting a beta-3 adrenergic agonist to improve bladder compliance in hopes that will help improve his bilateral hydronephrosis; Dr. Ronne Binning said the risk of that worsening patient's incomplete bladder emptying is considered to be low since current PVR is <185 ml. Patient agreed to start Myrbetriq 25 mg daily. Will plan for RUS and follow up in 2 months; if hydronephrosis persists at that time will likely proceed with urodynamic testing for further evaluation. Pt and family verbalized understanding and agreement. All questions were answered.   PLAN Advised the following: 1. Urine culture.  2. Take Bactrim DS 2x/day for 7 days. 3. Start Myrbetriq 25 mg daily. 4. Continue Rapaflo 8 mg daily and Proscar 5 mg daily. 5. Continue daily low dose Nitrofurantoin for UTI prophylaxis. empirically 6. Return in about 2 months (around 03/25/2023) for RUS and f/u with Dr. Ronne Binning or with NP (on a day when Dr. Ronne Binning).  Orders Placed This Encounter  Procedures   US RENAL    Standing Status:   Future    Standing Expiration Date:   01/22/2024    Order Specific Question:   Reason for Exam (SYMPTOM  OR DIAGNOSIS REQUIRED)    Answer:   hydronephrosis    Order Specific Question:   Preferred imaging location?    Answer:   [facility]   Urinalysis, Routine w reflex  microscopic   BLADDER SCAN AMB NON-IMAGING    It has been explained that the patient is to follow  regularly with their PCP in addition to all other providers involved in their care and to follow instructions provided by these respective offices. Patient advised to contact urology clinic if any urologic-pertaining questions, concerns, new symptoms or problems arise in the interim period.  There are no Patient Instructions on file for this visit.  Electronically signed by:  Donnita Falls, FNP   01/22/23    11:03 AM

## 2023-01-22 ENCOUNTER — Ambulatory Visit: Payer: PPO | Admitting: Urology

## 2023-01-22 ENCOUNTER — Encounter: Payer: Self-pay | Admitting: Urology

## 2023-01-22 VITALS — BP 100/65 | HR 87 | Temp 97.8°F | Ht 66.0 in | Wt 155.0 lb

## 2023-01-22 DIAGNOSIS — N390 Urinary tract infection, site not specified: Secondary | ICD-10-CM | POA: Diagnosis not present

## 2023-01-22 DIAGNOSIS — Z87448 Personal history of other diseases of urinary system: Secondary | ICD-10-CM

## 2023-01-22 DIAGNOSIS — G35 Multiple sclerosis: Secondary | ICD-10-CM | POA: Diagnosis not present

## 2023-01-22 DIAGNOSIS — R339 Retention of urine, unspecified: Secondary | ICD-10-CM

## 2023-01-22 DIAGNOSIS — N138 Other obstructive and reflux uropathy: Secondary | ICD-10-CM

## 2023-01-22 DIAGNOSIS — N401 Enlarged prostate with lower urinary tract symptoms: Secondary | ICD-10-CM

## 2023-01-22 DIAGNOSIS — Z87442 Personal history of urinary calculi: Secondary | ICD-10-CM | POA: Diagnosis not present

## 2023-01-22 DIAGNOSIS — Z87440 Personal history of urinary (tract) infections: Secondary | ICD-10-CM

## 2023-01-22 LAB — URINALYSIS, ROUTINE W REFLEX MICROSCOPIC
Bilirubin, UA: NEGATIVE
Ketones, UA: NEGATIVE
Nitrite, UA: NEGATIVE
Specific Gravity, UA: <1.005 — ABNORMAL LOW (ref 1.005–1.030)
Urobilinogen, Ur: 0.2 mg/dL (ref 0.2–1.0)
pH, UA: 6.5 (ref 5.0–7.5)

## 2023-01-22 LAB — MICROSCOPIC EXAMINATION: WBC, UA: >30 /hpf — AB (ref 0–5)

## 2023-01-22 LAB — BLADDER SCAN AMB NON-IMAGING: Scan Result: 171

## 2023-01-22 MED ORDER — MIRABEGRON ER 25 MG PO TB24
25.0000 mg | ORAL_TABLET | Freq: Every day | ORAL | 5 refills | Status: DC
Start: 2023-01-22 — End: 2023-01-26

## 2023-01-22 MED ORDER — SULFAMETHOXAZOLE-TRIMETHOPRIM 800-160 MG PO TABS
1.0000 | ORAL_TABLET | Freq: Two times a day (BID) | ORAL | 0 refills | Status: DC
Start: 2023-01-22 — End: 2023-02-14

## 2023-01-22 NOTE — Progress Notes (Signed)
post void residual=171 

## 2023-01-22 NOTE — Addendum Note (Signed)
Addended by: Grier Rocher on: 01/22/2023 12:06 PM   Modules accepted: Orders

## 2023-01-25 ENCOUNTER — Telehealth: Payer: Self-pay

## 2023-01-25 LAB — URINE CULTURE

## 2023-01-25 NOTE — Telephone Encounter (Signed)
-----   Message from Donnita Falls sent at 01/25/2023  9:07 AM EDT ----- Please let patient know urine culture result. Complete Bactrim as prescribed.

## 2023-01-25 NOTE — Telephone Encounter (Signed)
Wife called and made aware. 

## 2023-01-26 ENCOUNTER — Ambulatory Visit (INDEPENDENT_AMBULATORY_CARE_PROVIDER_SITE_OTHER): Payer: PPO | Admitting: Nurse Practitioner

## 2023-01-26 ENCOUNTER — Encounter: Payer: Self-pay | Admitting: Nurse Practitioner

## 2023-01-26 VITALS — BP 95/62 | HR 71 | Temp 97.3°F | Resp 20

## 2023-01-26 DIAGNOSIS — E1159 Type 2 diabetes mellitus with other circulatory complications: Secondary | ICD-10-CM

## 2023-01-26 DIAGNOSIS — G35D Multiple sclerosis, unspecified: Secondary | ICD-10-CM

## 2023-01-26 DIAGNOSIS — G35 Multiple sclerosis: Secondary | ICD-10-CM | POA: Diagnosis not present

## 2023-01-26 DIAGNOSIS — I129 Hypertensive chronic kidney disease with stage 1 through stage 4 chronic kidney disease, or unspecified chronic kidney disease: Secondary | ICD-10-CM

## 2023-01-26 DIAGNOSIS — K219 Gastro-esophageal reflux disease without esophagitis: Secondary | ICD-10-CM

## 2023-01-26 DIAGNOSIS — I25118 Atherosclerotic heart disease of native coronary artery with other forms of angina pectoris: Secondary | ICD-10-CM | POA: Diagnosis not present

## 2023-01-26 DIAGNOSIS — E785 Hyperlipidemia, unspecified: Secondary | ICD-10-CM

## 2023-01-26 DIAGNOSIS — N138 Other obstructive and reflux uropathy: Secondary | ICD-10-CM | POA: Diagnosis not present

## 2023-01-26 DIAGNOSIS — E559 Vitamin D deficiency, unspecified: Secondary | ICD-10-CM | POA: Diagnosis not present

## 2023-01-26 DIAGNOSIS — E1165 Type 2 diabetes mellitus with hyperglycemia: Secondary | ICD-10-CM | POA: Diagnosis not present

## 2023-01-26 DIAGNOSIS — E538 Deficiency of other specified B group vitamins: Secondary | ICD-10-CM

## 2023-01-26 DIAGNOSIS — N401 Enlarged prostate with lower urinary tract symptoms: Secondary | ICD-10-CM

## 2023-01-26 DIAGNOSIS — E1169 Type 2 diabetes mellitus with other specified complication: Secondary | ICD-10-CM

## 2023-01-26 DIAGNOSIS — I152 Hypertension secondary to endocrine disorders: Secondary | ICD-10-CM | POA: Diagnosis not present

## 2023-01-26 DIAGNOSIS — N184 Chronic kidney disease, stage 4 (severe): Secondary | ICD-10-CM | POA: Diagnosis not present

## 2023-01-26 DIAGNOSIS — D631 Anemia in chronic kidney disease: Secondary | ICD-10-CM

## 2023-01-26 DIAGNOSIS — E039 Hypothyroidism, unspecified: Secondary | ICD-10-CM

## 2023-01-26 DIAGNOSIS — R339 Retention of urine, unspecified: Secondary | ICD-10-CM

## 2023-01-26 DIAGNOSIS — R413 Other amnesia: Secondary | ICD-10-CM | POA: Diagnosis not present

## 2023-01-26 MED ORDER — ROSUVASTATIN CALCIUM 10 MG PO TABS
10.0000 mg | ORAL_TABLET | Freq: Every day | ORAL | 0 refills | Status: AC
Start: 2023-01-26 — End: ?

## 2023-01-26 MED ORDER — ISOSORBIDE MONONITRATE ER 60 MG PO TB24
ORAL_TABLET | ORAL | 0 refills | Status: AC
Start: 2023-01-26 — End: ?

## 2023-01-26 MED ORDER — FINASTERIDE 5 MG PO TABS
5.0000 mg | ORAL_TABLET | Freq: Every day | ORAL | 3 refills | Status: DC
Start: 2023-01-26 — End: 2023-09-17

## 2023-01-26 MED ORDER — OLMESARTAN MEDOXOMIL 5 MG PO TABS
5.0000 mg | ORAL_TABLET | Freq: Every day | ORAL | 1 refills | Status: AC
Start: 2023-01-26 — End: 2024-01-26

## 2023-01-26 MED ORDER — OMEPRAZOLE 20 MG PO CPDR
20.0000 mg | DELAYED_RELEASE_CAPSULE | Freq: Every day | ORAL | 1 refills | Status: AC
Start: 2023-01-26 — End: ?

## 2023-01-26 MED ORDER — LANTUS SOLOSTAR 100 UNIT/ML ~~LOC~~ SOPN
55.0000 [IU] | PEN_INJECTOR | Freq: Every day | SUBCUTANEOUS | 11 refills | Status: AC
Start: 2023-01-26 — End: ?

## 2023-01-26 MED ORDER — LEVOTHYROXINE SODIUM 75 MCG PO TABS
ORAL_TABLET | ORAL | 0 refills | Status: AC
Start: 2023-01-26 — End: ?

## 2023-01-26 MED ORDER — MIRABEGRON ER 25 MG PO TB24
25.0000 mg | ORAL_TABLET | Freq: Every day | ORAL | 5 refills | Status: AC
Start: 2023-01-26 — End: ?

## 2023-01-26 MED ORDER — FERROUS SULFATE 325 (65 FE) MG PO TABS
325.0000 mg | ORAL_TABLET | Freq: Every day | ORAL | 1 refills | Status: DC
Start: 2023-01-26 — End: 2023-07-30

## 2023-01-26 NOTE — Progress Notes (Signed)
Subjective:    [name], male    DOB: Aug 08, 1950, 72 y.o.   MRN: [id number]   Chief Complaint: medical management of chronic issues     HPI:  Jacob Fowler is a 72 y.o. who identifies as a male who was assigned male at birth.   Social history: Lives with: wife who is his caregiver. 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 Hypertension associated with type 2 diabetes mellitus (HCC) No c/o chest pain, sob or headache. Does not heck bood pressure at home. BP Readings from Last 3 Encounters:  01/22/23 100/65  12/25/22 120/74  10/27/22 100/64     2. Atherosclerosis of native coronary artery of native heart with stable angina pectoris (HCC) Has not seem=n cardiology  3. Hyperlipidemia associated with type 2 diabetes mellitus (HCC) Eats whatever his family gives him to eat Lab Results  Component Value Date   CHOL 127 10/27/2022   HDL 22 (L) 10/27/2022   LDLCALC 51 10/27/2022   TRIG 350 (H) 10/27/2022   CHOLHDL 5.8 (H) 10/27/2022     4. Uncontrolled type 2 diabetes mellitus with hyperglycemia, without long-term current use of insulin (HCC) Fasting blood running 160-250 which is an improvement. He has been on 55u for 2 months now and they are some better.  Lab Results  Component Value Date   HGBA1C 11.5 (H) 10/27/2022    5. Acquired hypothyroidism No issues that he is aware of  Lab Results  Component Value Date   TSH 4.240 11/30/2021     6. Memory loss They try to orient him daily. He still recognizes family  7. Vitamin D deficiency Is on daily vitamin d supplement  8. Multiple sclerosis (HCC) is in a wheel chair. Cannot walk anymore. Is 100% care.  9. BPH with obstruction/lower urinary tract symptoms No voiding issues  10. B12 deficiency Family gives him b12 injections.   New complaints: None today  Allergies  Allergen  Reactions   Farxiga [Dapagliflozin] Other (See Comments)    unable to take Marcelline Deist due to recurrent UTIs, urinary retention and hydronephrosis    Chauncey Mann [Finerenone] Other (See Comments)    hyperkalemia   Outpatient Encounter Medications as of 01/26/2023  Medication Sig   Ascorbic Acid (VITAMIN C PO) Take by mouth.   aspirin 81 MG EC tablet Take 81 mg by mouth daily.   Cholecalciferol (VITAMIN D) 50 MCG (2000 UT) tablet Take 2,000 Units by mouth daily.   cyanocobalamin (VITAMIN B12) 1000 MCG/ML injection INJECT INTO THE SKIN ONCE A MONTH   ferrous sulfate 325 (65 FE) MG tablet Take 1 tablet (325 mg total) by mouth daily with breakfast.   finasteride (PROSCAR) 5 MG tablet Take 1 tablet (5 mg total) by mouth daily.   glucose blood test strip Use as instructed   insulin glargine (LANTUS SOLOSTAR) 100 UNIT/ML Solostar Pen Inject 55 Units into the skin daily.   insulin glargine (LANTUS) 100 UNIT/ML injection Inject 0.55 mLs (55 Units total) into the skin daily.   insulin glargine (LANTUS) 100 unit/mL SOPN Inject 30 Units into the skin daily.   Insulin Pen Needle 31G X 5 MM MISC Use with lantus pen as directed   isosorbide mononitrate (IMDUR) 60 MG 24 hr tablet TAKE 1/2 TABLET (30MG  TOTAL) BY MOUTH DAILY   levothyroxine (SYNTHROID) 75 MCG tablet TAKE ONE TABLET ( TOTAL)  BY MOUTH DAILY BEFORE BREAKFAST   Menthol-Zinc Oxide (CALMOSEPTINE) 0.44-20.6 % OINT Apply small amount o affected are BID   mirabegron ER (MYRBETRIQ) 25 MG TB24 tablet Take 1 tablet (25 mg total) by mouth daily.   nitrofurantoin (MACRODANTIN) 50 MG capsule Take 1 capsule (50 mg total) by mouth at bedtime.   olmesartan (BENICAR) 5 MG tablet Take by mouth.   omeprazole (PRILOSEC) 20 MG capsule Take 1 capsule (20 mg total) by mouth daily.   rosuvastatin (CRESTOR) 10 MG tablet TAKE ONE TABLET BY MOUTH EVERY DAY   silodosin (RAPAFLO) 8 MG CAPS capsule Take 1 capsule (8 mg total) by mouth at bedtime.   sodium bicarbonate  650 MG tablet Take 650 mg by mouth 4 (four) times daily.   sulfamethoxazole-trimethoprim (BACTRIM DS) 800-160 MG tablet Take 1 tablet by mouth every 12 (twelve) hours.   VITAMIN D PO Take by mouth.   [DISCONTINUED] atorvastatin (LIPITOR) 10 MG tablet Take 10 mg by mouth daily.   [DISCONTINUED] niacin-simvastatin (SIMCOR) 500-20 MG 24 hr tablet Take 1 tablet by mouth at bedtime.     No facility-administered encounter medications on file as of 01/26/2023.    Past Surgical History:  Procedure Laterality Date   CARDIAC CATHETERIZATION  02-26-2009 ---  DR Julien Nordmann   NONOBSTRUCTIVE CAD/ 50% DISTAL RCA/ 30% LAD / NORMAL LVF   CYSTOSCOPY W/ RETROGRADES  10/30/2011   Procedure: CYSTOSCOPY WITH RETROGRADE PYELOGRAM;  Surgeon: Garnett Farm, MD;  Location: [facility];  Service: Urology;  Laterality: Right;   CYSTOSCOPY WITH LITHOLAPAXY  10/30/2011   Procedure: CYSTOSCOPY WITH LITHOLAPAXY;  Surgeon: Garnett Farm, MD;  Location: [facility];  Service: Urology;  Laterality: N/A;   CYSTOSCOPY WITH RETROGRADE PYELOGRAM, URETEROSCOPY AND STENT PLACEMENT Left 11/25/2020   Procedure: CYSTOSCOPY WITH LEFT RETROGRADE PYELOGRAM, LEFT URETEROSCOPY WITH LASER AND LEFT URETERAL STENT PLACEMENT;  Surgeon: Malen Gauze, MD;  Location: [facility];  Service: Urology;  Laterality: Left;   EXTRACORPOREAL SHOCK WAVE LITHOTRIPSY  09/05/2010   RIGHT   EYE SURGERY Right 08/30/2018   HOLMIUM LASER APPLICATION Left 11/25/2020   Procedure: HOLMIUM LASER APPLICATION;  Surgeon: Malen Gauze, MD;  Location: [facility];  Service: Urology;  Laterality: Left;   NESBIT PROCEDURE  10/17/2004   CORRECTION OF PENILE ANGULATION (PEYRONIES DISEASE)   UTI  06/17/2022    Family History  Problem Relation Age of Onset   Stroke Mother    Heart attack Mother    Diabetes Mother    Hypertension Mother    Renal cancer Father    Diabetes Sister    Heart attack Brother    Diabetes Son    Heart  attack Maternal Grandfather    Heart attack Brother    Bladder Cancer Brother       Controlled substance contract: n/a     Review of Systems  Constitutional:  Negative for diaphoresis.  Eyes:  Negative for pain.  Respiratory:  Negative for shortness of breath.   Cardiovascular:  Negative for chest pain, palpitations and leg swelling.  Gastrointestinal:  Negative for abdominal pain.  Endocrine: Negative for polydipsia.  Skin:  Negative for rash.  Neurological:  Negative for dizziness, weakness and headaches.  Hematological:  Does not bruise/bleed easily.  All other systems reviewed and are negative.      Objective:   Physical Exam Vitals and nursing note reviewed.  Constitutional:      Appearance: Normal appearance. He is well-developed.  HENT:  Head: Normocephalic.     Nose: Nose normal.     Mouth/Throat:     Mouth: Mucous membranes are moist.     Pharynx: Oropharynx is clear.  Eyes:     Pupils: Pupils are equal, round, and reactive to light.  Neck:     Thyroid: No thyroid mass or thyromegaly.     Vascular: No carotid bruit or JVD.     Trachea: Phonation normal.  Cardiovascular:     Rate and Rhythm: Normal rate and regular rhythm.  Pulmonary:     Effort: Pulmonary effort is normal. No respiratory distress.     Breath sounds: Normal breath sounds.  Abdominal:     General: Bowel sounds are normal.     Palpations: Abdomen is soft.     Tenderness: There is no abdominal tenderness.  Musculoskeletal:        General: Normal range of motion.     Cervical back: Normal range of motion and neck supple.     Comments: In wheel chair  Lymphadenopathy:     Cervical: No cervical adenopathy.  Skin:    General: Skin is warm and dry.  Neurological:     Mental Status: He is alert and oriented to person, place, and time.  Psychiatric:        Behavior: Behavior normal.        Thought Content: Thought content normal.        Judgment: Judgment normal.     BP 95/62    Pulse 71   Temp (!) 97.3 F (36.3 C) (Temporal)   Resp 20   SpO2 97%        Assessment & Plan:   Jacob Fowler comes in today with chief complaint of Medical Management of Chronic Issues   Diagnosis and orders addressed:  1. Hypertension associated with type 2 diabetes mellitus (HCC) Low sodium diet - CBC with Differential/Platelet - CMP14+EGFR - isosorbide mononitrate (IMDUR) 60 MG 24 hr tablet; TAKE 1/2 TABLET (30MG  TOTAL) BY MOUTH DAILY  Dispense: 45 tablet; Refill: 0 - olmesartan (BENICAR) 5 MG tablet; Take 1 tablet (5 mg total) by mouth daily.  Dispense: 90 tablet; Refill: 1  2. Atherosclerosis of native coronary artery of native heart with stable angina pectoris (HCC)  3. Hyperlipidemia associated with type 2 diabetes mellitus (HCC) Low fat diet - Lipid panel - rosuvastatin (CRESTOR) 10 MG tablet; Take 1 tablet (10 mg total) by mouth daily.  Dispense: 90 tablet; Refill: 0  4. Uncontrolled type 2 diabetes mellitus with hyperglycemia, without long-term current use of insulin (HCC) Going to make appt with clinical pharmacist to discuss diabetes. He is having lows in morning and highs during the day. - Bayer DCA Hb A1c Waived - insulin glargine (LANTUS SOLOSTAR) 100 UNIT/ML Solostar Pen; Inject 55 Units into the skin daily.  Dispense: 30 mL; Refill: 11 - rosuvastatin (CRESTOR) 10 MG tablet; Take 1 tablet (10 mg total) by mouth daily.  Dispense: 90 tablet; Refill: 0  5. Acquired hypothyroidism - levothyroxine (SYNTHROID) 75 MCG tablet; TAKE ONE TABLET ( TOTAL) BY MOUTH DAILY BEFORE BREAKFAST  Dispense: 90 tablet; Refill: 0 - Thyroid Panel With TSH  6. Memory loss [place] daily  7. Vitamin D deficiency Continue vitamin d daily  8. Multiple sclerosis (HCC) Keep follow up with neurology - mirabegron ER (MYRBETRIQ) 25 MG TB24 tablet; Take 1 tablet (25 mg total) by mouth daily.  Dispense: 30 tablet; Refill: 5  9. BPH with obstruction/lower urinary tract  symptoms -  finasteride (PROSCAR) 5 MG tablet; Take 1 tablet (5 mg total) by mouth daily.  Dispense: 90 tablet; Refill: 3  10. B12 deficiency Continue b12 injections  11. Anemia due to stage 4 chronic kidney disease (HCC) Labs oending - ferrous sulfate 325 (65 FE) MG tablet; Take 1 tablet (325 mg total) by mouth daily with breakfast.  Dispense: 90 tablet; Refill: 1  12. Benign prostatic hyperplasia with urinary obstruction Report any voiding issues - finasteride (PROSCAR) 5 MG tablet; Take 1 tablet (5 mg total) by mouth daily.  Dispense: 90 tablet; Refill: 3  13. Gastroesophageal reflux disease, unspecified whether esophagitis present Avoid spicy foods Do not eat 2 hours prior to bedtime - omeprazole (PRILOSEC) 20 MG capsule; Take 1 capsule (20 mg total) by mouth daily.  Dispense: 90 capsule; Refill: 1   15. Incomplete bladder emptying - mirabegron ER (MYRBETRIQ) 25 MG TB24 tablet; Take 1 tablet (25 mg total) by mouth daily.  Dispense: 30 tablet; Refill: 5   Labs pending Health Maintenance reviewed Diet and exercise encouraged  Follow up plan: 3 months   Mary-Margaret Daphine Deutscher, FNP

## 2023-01-27 LAB — LIPID PANEL
Chol/HDL Ratio: 4.2 ratio (ref 0.0–5.0)
Cholesterol, Total: 104 mg/dL (ref 100–199)
HDL: 25 mg/dL — ABNORMAL LOW (ref >39)
LDL Chol Calc (NIH): 54 mg/dL (ref 0–99)
Triglycerides: 145 mg/dL (ref 0–149)
VLDL Cholesterol Cal: 25 mg/dL (ref 5–40)

## 2023-01-27 LAB — CMP14+EGFR
ALT: 8 IU/L (ref 0–44)
AST: 14 IU/L (ref 0–40)
Albumin: 4.3 g/dL (ref 3.8–4.8)
Alkaline Phosphatase: 97 IU/L (ref 44–121)
BUN/Creatinine Ratio: 20 (ref 10–24)
BUN: 49 mg/dL — ABNORMAL HIGH (ref 8–27)
Bilirubin Total: <0.2 mg/dL (ref 0.0–1.2)
CO2: 19 mmol/L — ABNORMAL LOW (ref 20–29)
Calcium: 9.5 mg/dL (ref 8.6–10.2)
Chloride: 91 mmol/L — ABNORMAL LOW (ref 96–106)
Creatinine, Ser: 2.45 mg/dL — ABNORMAL HIGH (ref 0.76–1.27)
Globulin, Total: 2.7 g/dL (ref 1.5–4.5)
Glucose: 175 mg/dL — ABNORMAL HIGH (ref 70–99)
Potassium: 4.8 mmol/L (ref 3.5–5.2)
Sodium: 126 mmol/L — ABNORMAL LOW (ref 134–144)
Total Protein: 7 g/dL (ref 6.0–8.5)
eGFR: 27 mL/min/{1.73_m2} — ABNORMAL LOW (ref >59)

## 2023-01-27 LAB — THYROID PANEL WITH TSH
Free Thyroxine Index: 2.1 (ref 1.2–4.9)
T3 Uptake Ratio: 29 % (ref 24–39)
T4, Total: 7.4 ug/dL (ref 4.5–12.0)
TSH: 4.94 u[IU]/mL — ABNORMAL HIGH (ref 0.450–4.500)

## 2023-01-27 LAB — CBC WITH DIFFERENTIAL/PLATELET
Basophils Absolute: 0.1 10*3/uL (ref 0.0–0.2)
Basos: 1 %
EOS (ABSOLUTE): 0.5 10*3/uL — ABNORMAL HIGH (ref 0.0–0.4)
Eos: 4 %
Hematocrit: 32.9 % — ABNORMAL LOW (ref 37.5–51.0)
Hemoglobin: 10.8 g/dL — ABNORMAL LOW (ref 13.0–17.7)
Immature Grans (Abs): 0.1 10*3/uL (ref 0.0–0.1)
Immature Granulocytes: 1 %
Lymphocytes Absolute: 2 10*3/uL (ref 0.7–3.1)
Lymphs: 15 %
MCH: 28.5 pg (ref 26.6–33.0)
MCHC: 32.8 g/dL (ref 31.5–35.7)
MCV: 87 fL (ref 79–97)
Monocytes Absolute: 1.1 10*3/uL — ABNORMAL HIGH (ref 0.1–0.9)
Monocytes: 8 %
Neutrophils Absolute: 9.9 10*3/uL — ABNORMAL HIGH (ref 1.4–7.0)
Neutrophils: 71 %
Platelets: 332 10*3/uL (ref 150–450)
RBC: 3.79 x10E6/uL — ABNORMAL LOW (ref 4.14–5.80)
RDW: 14.2 % (ref 11.6–15.4)
WBC: 13.6 10*3/uL — ABNORMAL HIGH (ref 3.4–10.8)

## 2023-01-29 ENCOUNTER — Other Ambulatory Visit: Payer: Self-pay

## 2023-01-29 DIAGNOSIS — E1165 Type 2 diabetes mellitus with hyperglycemia: Secondary | ICD-10-CM

## 2023-01-29 LAB — BAYER DCA HB A1C WAIVED: HB A1C (BAYER DCA - WAIVED): 10.4 % — ABNORMAL HIGH (ref 4.8–5.6)

## 2023-01-30 ENCOUNTER — Telehealth: Payer: Self-pay

## 2023-01-30 NOTE — Progress Notes (Signed)
   Care Guide Note  01/30/2023 Name: Jacob Fowler MRN: [id number] DOB: [birth year]  Referred by: Bennie Pierini, FNP Reason for referral : Care Coordination (Outreach to schedule with Pharm d )   Jacob Fowler is a 72 y.o. year old male who is a primary care patient of Bennie Pierini, FNP. Jacob Fowler was referred to the pharmacist for assistance related to DM.    Successful contact was made with the patient to discuss pharmacy services including being ready for the pharmacist to call at least 5 minutes before the scheduled appointment time, to have medication bottles and any blood sugar or blood pressure readings ready for review. The patient agreed to meet with the pharmacist via with the pharmacist via telephone visit on (date/time).  03/02/2023   Penne Lash, RMA Care Guide [facility]  [place], [place] [address] Direct Dial: [phone] [email or web address]

## 2023-02-03 DIAGNOSIS — N390 Urinary tract infection, site not specified: Secondary | ICD-10-CM | POA: Diagnosis not present

## 2023-02-05 ENCOUNTER — Telehealth: Payer: Self-pay

## 2023-02-05 NOTE — Telephone Encounter (Signed)
-----   Message from Nurse Jill Side sent at 02/03/2023  8:31 AM EDT ----- triage

## 2023-02-05 NOTE — Telephone Encounter (Signed)
Patient's wife states that patient was seen over the weekend at urgent care and was treated for a UTI.  She is aware to call us back if patient's symptoms do not improve.  She states patient is doing better at this time.

## 2023-02-14 ENCOUNTER — Encounter: Payer: Self-pay | Admitting: Neurology

## 2023-02-14 ENCOUNTER — Ambulatory Visit: Payer: PPO | Admitting: Neurology

## 2023-02-14 VITALS — BP 91/64 | HR 86 | Ht 64.0 in | Wt 160.0 lb

## 2023-02-14 DIAGNOSIS — M21371 Foot drop, right foot: Secondary | ICD-10-CM

## 2023-02-14 DIAGNOSIS — R39198 Other difficulties with micturition: Secondary | ICD-10-CM

## 2023-02-14 DIAGNOSIS — G35 Multiple sclerosis: Secondary | ICD-10-CM | POA: Diagnosis not present

## 2023-02-14 DIAGNOSIS — G830 Diplegia of upper limbs: Secondary | ICD-10-CM

## 2023-02-14 NOTE — Progress Notes (Signed)
[facility]  PATIENT: Jacob Fowler DOB: Mar 09, 1951  REFERRING DOCTOR OR PCP:  Rudi Heap  _________________________________   HISTORICAL  CHIEF COMPLAINT:  Chief Complaint  Patient presents with   Follow-up    Rm 10, wife and daughter. No changes.  Stable per pt.  Currently on ABX for UTI.  Wheelchair bound.  No DMT.     HISTORY OF PRESENT ILLNESS:  Jacob Fowler is a 72 year old man with multiple sclerosis.      Update 08/16/2022 He stopped Betaseron last year and has no exacerbation.. 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   He is on finasteride.  He has a citrobacter freundii and just started doxycycline.     Since the hospital stay he is weaker and having more trouble moving and is now in wheelchair.   He did PT but had no benefit so family is doing some of the exercises   MRI in 2020 showed no progression compared to 2018.  We discussed stopping DMT vs continung and he opted to continue Betaseron.  He has 2 spinal cord plaques.   He has had 4 stable MRI's brain/cervical spine since 2012   For many years, the main problem has been right greater than left leg weakness and poor gait.  This is all worsened since his UTI and hospitalization with bedrest.  Ampyra and amantadine have not helped his gait.  He has urinary hesitancy and frequency.   He is on finasteride for his BPH and urinary retention.  He felt he did better when he was also on tamsulosin but that had been discontinued..   Vision is doing well.    He has T2 IDDM,.   His HgbAic was about 10.4 last fine.   He has CRF with eGFR 28.    MS History:    He began to note significant difficulty with gait in January 2010.      He had some back x-rays. He was then referred to neurosurgery that noted that he had plaques in his spine c/w  MS. He had further testing including MRI of  the brain and spinal tap. These were all consistent with multiple sclerosis and by November 2010 he had started Betaseron. He feels that he has been mostly stable over the past 5 years but has noted that his gait is a little worse than it was in 2010. About 2013, he started using a cane.  Around 2020 he started using a walker more  IMAGING MRI cervical spine2/19/18 shows T2 hyperintense lesions within the spinal cord to the left adjacent to C4-C5 and to the right adjacent to T1, unchanged in appearance when compared to the previous MRI.     Mild degenerative changes at C3-C4, C4-C5 and C6-C7 that did not lead to any nerve root compression.    There is a normal enhancement pattern and there are no acute findings.   08/25/2016 MRI of the brain with and without contrast shows   Multiple T2/FLAIR hyperintense foci in the periventricular, juxtacortical and deep white matter of the hemispheres and 2 foci in the brainstem in a pattern and configuration consistent with chronic demyelinating plaque associated with multiple sclerosis. None of the foci appears to be acute. When compared to the MRI dated 09/28/2014, there is no interval change..   Mild to moderate cortical atrophy that has mildly progressed when compared to the  previous MRI.Marland Kitchen    There are no acute findings and there is a normal enhancement pattern.  MRI 03/31/2019 brain and cervical spine shows " T2 hyperintense lesions within the spinal cord to the left adjacent to C4-C5 and to the right adjacent to T1, unchanged in appearance when compared to the previous MRI."   MRI brain showed no change in MS lesions.   REVIEW OF SYSTEMS: Constitutional: No fevers, chills, sweats, or change in appetite.  He notes fatigue most afternoon.  He has occasional insomnia. Eyes: No visual changes, double vision, eye pain Ear, nose and throat: No hearing loss, ear pain, nasal congestion, sore throat Cardiovascular: No chest pain, palpitations Respiratory:  No shortness  of breath at rest or with exertion.   No wheezes GastrointestinaI: No nausea, vomiting, diarrhea, abdominal pain, fecal incontinence Genitourinary:  urinary frequency and hesitancy  .  He has nocturia.   Musculoskeletal:  No neck pain, back pain Integumentary: He denies rash or skin lesions. Neurological: as above Psychiatric: No depression at this time.  No anxiety Endocrine: No palpitations, diaphoresis, change in appetite, change in weigh or increased thirst Hematologic/Lymphatic:  No anemia, purpura, petechiae. Allergic/Immunologic: No itchy/runny eyes, nasal congestion, recent allergic reactions, rashes  ALLERGIES: Allergies  Allergen Reactions   Farxiga [Dapagliflozin] Other (See Comments)    unable to take [place] due to recurrent UTIs, urinary retention and hydronephrosis    Chauncey Mann [Finerenone] Other (See Comments)    hyperkalemia    HOME MEDICATIONS:  Current Outpatient Medications:    Ascorbic Acid (VITAMIN C PO), Take by mouth., Disp: , Rfl:    aspirin 81 MG EC tablet, Take 81 mg by mouth daily., Disp: , Rfl:    Cholecalciferol (VITAMIN D) 50 MCG (2000 UT) tablet, Take 2,000 Units by mouth daily., Disp: , Rfl:    cyanocobalamin (VITAMIN B12) 1000 MCG/ML injection, INJECT INTO THE SKIN ONCE A MONTH, Disp: 3 mL, Rfl: 5   ferrous sulfate 325 (65 FE) MG tablet, Take 1 tablet (325 mg total) by mouth daily with breakfast., Disp: 90 tablet, Rfl: 1   finasteride (PROSCAR) 5 MG tablet, Take 1 tablet (5 mg total) by mouth daily., Disp: 90 tablet, Rfl: 3   fluconazole (DIFLUCAN) 200 MG tablet, SMARTSIG:1 Tablet(s) By Mouth 1-2 Times Daily, Disp: , Rfl:    glucose blood test strip, Use as instructed, Disp: 100 each, Rfl: 12   insulin glargine (LANTUS SOLOSTAR) 100 UNIT/ML Solostar Pen, Inject 55 Units into the skin daily., Disp: 30 mL, Rfl: 11   Insulin Pen Needle 31G X 5 MM MISC, Use with lantus pen as directed, Disp: 100 each, Rfl: 2   isosorbide mononitrate (IMDUR) 60 MG 24  hr tablet, TAKE 1/2 TABLET (30MG  TOTAL) BY MOUTH DAILY, Disp: 45 tablet, Rfl: 0   levothyroxine (SYNTHROID) 75 MCG tablet, TAKE ONE TABLET ( TOTAL) BY MOUTH DAILY BEFORE BREAKFAST, Disp: 90 tablet, Rfl: 0   magnesium oxide (MAG-OX) 400 MG tablet, Take 1 tablet by mouth daily., Disp: , Rfl:    Menthol-Zinc Oxide (CALMOSEPTINE) 0.44-20.6 % OINT, Apply small amount o affected are BID, Disp: 113 g, Rfl: 2   mirabegron ER (MYRBETRIQ) 25 MG TB24 tablet, Take 1 tablet (25 mg total) by mouth daily., Disp: 30 tablet, Rfl: 5   olmesartan (BENICAR) 5 MG tablet, Take 1 tablet (5 mg total) by mouth daily., Disp: 90 tablet, Rfl: 1   omeprazole (PRILOSEC) 20 MG capsule, Take 1 capsule (20 mg total) by mouth daily., Disp: 90 capsule,  Rfl: 1   penicillin v potassium (VEETID) 500 MG tablet, Take 500 mg by mouth 2 (two) times daily., Disp: , Rfl:    rosuvastatin (CRESTOR) 10 MG tablet, Take 1 tablet (10 mg total) by mouth daily., Disp: 90 tablet, Rfl: 0   silodosin (RAPAFLO) 8 MG CAPS capsule, Take 1 capsule (8 mg total) by mouth at bedtime., Disp: 90 capsule, Rfl: 3   sodium bicarbonate 650 MG tablet, Take 650 mg by mouth 4 (four) times daily., Disp: , Rfl:    sodium citrate-citric acid (ORACIT) 500-334 MG/5ML solution, Take 15 mLs by mouth 2 (two) times daily., Disp: , Rfl:    nitrofurantoin (MACRODANTIN) 50 MG capsule, Take 1 capsule (50 mg total) by mouth at bedtime. (Patient not taking: Reported on 02/14/2023), Disp: 30 capsule, Rfl: 11  PAST MEDICAL HISTORY: Past Medical History:  Diagnosis Date   Acquired leg length discrepancy    Bladder stone    BPH associated with nocturia    CAD (coronary artery disease) CARDIOLOGIST- DR HOCHREIN--  LAST VISIT 09-27-2011 IN EPIC   Non obstructive Cath 2010-  HX CORONARY SPASM 2004   Cataract    bilateral    GERD (gastroesophageal reflux disease)    H/O hiatal hernia    Heart murmur    Heartburn    High cholesterol    History of nephrolithiasis 2009   Hx of  adenomatous colonic polyps    Hyperlipidemia    Hypertension    Movement disorder    Multiple sclerosis (HCC) DX  10/10---  NEUROLOGIST  DR Gerlene Burdock [place] ([place])   RIGHT SIDE AFFECTED MORE W/ WEAKNESS- - USES CANE   Nephrolithiasis 08/30/2020   Neuromuscular disorder (HCC)    MS   NIDDM (non-insulin dependent diabetes mellitus)    Peyronie's disease    RBBB    Renal stone RIGHT   Rosacea    Vision abnormalities     PAST SURGICAL HISTORY: Past Surgical History:  Procedure Laterality Date   CARDIAC CATHETERIZATION  02-26-2009 ---  DR Julien Nordmann   NONOBSTRUCTIVE CAD/ 50% DISTAL RCA/ 30% LAD / NORMAL LVF   CYSTOSCOPY W/ RETROGRADES  10/30/2011   Procedure: CYSTOSCOPY WITH RETROGRADE PYELOGRAM;  Surgeon: Garnett Farm, MD;  Location: [facility] ;  Service: Urology;  Laterality: Right;   CYSTOSCOPY WITH LITHOLAPAXY  10/30/2011   Procedure: CYSTOSCOPY WITH LITHOLAPAXY;  Surgeon: Garnett Farm, MD;  Location: [facility];  Service: Urology;  Laterality: N/A;   CYSTOSCOPY WITH RETROGRADE PYELOGRAM, URETEROSCOPY AND STENT PLACEMENT Left 11/25/2020   Procedure: CYSTOSCOPY WITH LEFT RETROGRADE PYELOGRAM, LEFT URETEROSCOPY WITH LASER AND LEFT URETERAL STENT PLACEMENT;  Surgeon: Malen Gauze, MD;  Location: [facility];  Service: Urology;  Laterality: Left;   EXTRACORPOREAL SHOCK WAVE LITHOTRIPSY  09/05/2010   RIGHT   EYE SURGERY Right 08/30/2018   HOLMIUM LASER APPLICATION Left 11/25/2020   Procedure: HOLMIUM LASER APPLICATION;  Surgeon: Malen Gauze, MD;  Location: [facility];  Service: Urology;  Laterality: Left;   NESBIT PROCEDURE  10/17/2004   CORRECTION OF PENILE ANGULATION (PEYRONIES DISEASE)   UTI  06/17/2022    FAMILY HISTORY: Family History  Problem Relation Age of Onset   Stroke Mother    Heart attack Mother    Diabetes Mother    Hypertension Mother    Renal cancer Father    Diabetes Sister    Heart attack Brother    Diabetes  Son    Heart attack Maternal Grandfather  Heart attack Brother    Bladder Cancer Brother     SOCIAL HISTORY:  Social History   Socioeconomic History   Marital status: Married    Spouse name: Steward Drone   Number of children: 2   Years of education: 13   Highest education level: Some college, no degree  Occupational History   Occupation: retired    Comment: self -employed farmer  Tobacco Use   Smoking status: Former    Current packs/day: 0.00    Average packs/day: 1 pack/day for 7.0 years (7.0 ttl pk-yrs)    Types: Cigarettes    Start date: 09/27/1966    Quit date: 09/26/1973    Years since quitting: 49.4   Smokeless tobacco: Current    Types: Chew   Tobacco comments:    Chewing tobacco  Vaping Use   Vaping status: Never Used  Substance and Sexual Activity   Alcohol use: No    Alcohol/week: 0.0 standard drinks of alcohol   Drug use: No   Sexual activity: Not Currently  Other Topics Concern   Not on file  Social History Narrative   Lives with wife, has MS, mobility issues.   Social Determinants of Health   Financial Resource Strain: Low Risk  (11/23/2022)   Overall Financial Resource Strain (CARDIA)    Difficulty of Paying Living Expenses: Not hard at all  Food Insecurity: No Food Insecurity (11/23/2022)   Hunger Vital Sign    Worried About Running Out of Food in the Last Year: Never true    Ran Out of Food in the Last Year: Never true  Transportation Needs: No Transportation Needs (11/23/2022)   PRAPARE - Administrator, Civil Service (Medical): No    Lack of Transportation (Non-Medical): No  Physical Activity: Insufficiently Active (11/23/2022)   Exercise Vital Sign    Days of Exercise per Week: 1 day    Minutes of Exercise per Session: 10 min  Stress: No Stress Concern Present (11/23/2022)   [employer] of Occupational Health - Occupational Stress Questionnaire    Feeling of Stress : Not at all  Social Connections: Moderately Integrated (11/23/2022)    Social Connection and Isolation Panel [NHANES]    Frequency of Communication with Friends and Family: More than three times a week    Frequency of Social Gatherings with Friends and Family: More than three times a week    Attends Religious Services: More than 4 times per year    Active Member of [employer] or Organizations: No    Attends Banker Meetings: Never    Marital Status: Married  Catering manager Violence: Not At Risk (11/23/2022)   Humiliation, Afraid, Rape, and Kick questionnaire    Fear of Current or Ex-Partner: No    Emotionally Abused: No    Physically Abused: No    Sexually Abused: No     PHYSICAL EXAM  Vitals:   02/14/23 1601  BP: 91/64  Pulse: 86  Weight: 160 lb (72.6 kg)  Height: 5\' 4"  (1.626 m)    Body mass index is 27.46 kg/m.   General: The patient is well-developed and well-nourished and in no acute distress  Neurologic Exam  Mental status: The patient is alert and oriented x 3 at the time of the examination. The patient has apparent normal recent and remote memory, with an apparently normal attention span and concentration ability.   Speech is normal.  Cranial nerves: Extraocular movements are full.   There is good facial sensation to soft  touch bilaterally.Facial strength is normal.  Trapezius and sternocleidomastoid strength is normal. No dysarthria is noted.  The tongue is midline, and the patient has symmetric elevation of the soft palate. No obvious hearing deficits are noted.  Motor:  Muscle bulk is normal.   Increased muscle tone in the legs, right greater than left.  The strength was 2-/5 in the right and 4-/5 in the left hip flexures. Strength was 2/5 in the right quad and 4/5 on the left. Distally strength was 2- to 2 in the right leg and 4 in the left leg. Strength was 5/5 in the right arm but 4/5 in left arm deltoid and triceps but 5/5 biceps.   .     Sensory: Sensory testing is intact to touch and vibration sensation in all 4  extremities.  Coordination: Cerebellar testing shows good right finger-nose-finger. Reduced on left due more to strength He has reduced heel-to-shin on the left and is unable to do on the right  Gait and station: He is currently wheelchair-bound and is unable to walk  Reflexes:  Deep tendon reflexes were normal in the arms.  Deep tendon reflexes are increased in both legs, right > left     DIAGNOSTIC DATA (LABS, IMAGING, TESTING) - I reviewed patient records, labs, notes, testing and imaging myself where available.  Lab Results  Component Value Date   WBC 13.6 (H) 01/26/2023   HGB 10.8 (L) 01/26/2023   HCT 32.9 (L) 01/26/2023   MCV 87 01/26/2023   PLT 332 01/26/2023      Component Value Date/Time   NA 126 (L) 01/26/2023 1622   K 4.8 01/26/2023 1622   CL 91 (L) 01/26/2023 1622   CO2 19 (L) 01/26/2023 1622   GLUCOSE 175 (H) 01/26/2023 1622   GLUCOSE 184 (H) 06/19/2022 0408   BUN 49 (H) 01/26/2023 1622   CREATININE 2.45 (H) 01/26/2023 1622   CREATININE 1.13 01/06/2013 1311   CALCIUM 9.5 01/26/2023 1622   PROT 7.0 01/26/2023 1622   ALBUMIN 4.3 01/26/2023 1622   AST 14 01/26/2023 1622   ALT 8 01/26/2023 1622   ALKPHOS 97 01/26/2023 1622   BILITOT <0.2 01/26/2023 1622   GFRNONAA 35 (L) 06/19/2022 0408   GFRNONAA 69 01/06/2013 1311   GFRAA 80 07/28/2020 0811   GFRAA 80 01/06/2013 1311   Lab Results  Component Value Date   CHOL 104 01/26/2023   HDL 25 (L) 01/26/2023   LDLCALC 54 01/26/2023   TRIG 145 01/26/2023   CHOLHDL 4.2 01/26/2023   Lab Results  Component Value Date   HGBA1C 10.4 (H) 01/26/2023        ASSESSMENT AND PLAN  Multiple sclerosis (HCC)  Urinary dysfunction  Diplegia (HCC)  Right foot drop   1.  Remain off a DMT as non-relapsing SPMS.   Arm is mildly weaker than last visit.  2.  Lower BP might be due to olmesartan, silodosan, finasteride.   Advised to keep hydrated.  If becomes symptomatic may need to stop one of these med's.  3.  Stay  active and exercise as tolerated.  4.  He will return to see [place]  in 6 months or sooner if he has new or worsening neurologic symptoms.   Elmond Poehlman A. Epimenio Foot, MD, PhD 02/14/2023, 5:03 PM Certified in Neurology, Clinical Neurophysiology, Sleep Medicine, Pain Medicine and Neuroimaging  [facility] [address], [address] [place], [place] [address] [phone]

## 2023-02-26 DIAGNOSIS — E1122 Type 2 diabetes mellitus with diabetic chronic kidney disease: Secondary | ICD-10-CM | POA: Diagnosis not present

## 2023-02-26 DIAGNOSIS — N2581 Secondary hyperparathyroidism of renal origin: Secondary | ICD-10-CM | POA: Diagnosis not present

## 2023-02-26 DIAGNOSIS — R338 Other retention of urine: Secondary | ICD-10-CM | POA: Diagnosis not present

## 2023-02-26 DIAGNOSIS — R808 Other proteinuria: Secondary | ICD-10-CM | POA: Diagnosis not present

## 2023-02-26 DIAGNOSIS — N189 Chronic kidney disease, unspecified: Secondary | ICD-10-CM | POA: Diagnosis not present

## 2023-02-26 DIAGNOSIS — E8722 Chronic metabolic acidosis: Secondary | ICD-10-CM | POA: Diagnosis not present

## 2023-02-26 DIAGNOSIS — D649 Anemia, unspecified: Secondary | ICD-10-CM | POA: Diagnosis not present

## 2023-02-26 DIAGNOSIS — E1129 Type 2 diabetes mellitus with other diabetic kidney complication: Secondary | ICD-10-CM | POA: Diagnosis not present

## 2023-03-02 ENCOUNTER — Ambulatory Visit (INDEPENDENT_AMBULATORY_CARE_PROVIDER_SITE_OTHER): Payer: PPO | Admitting: Pharmacist

## 2023-03-02 DIAGNOSIS — E1165 Type 2 diabetes mellitus with hyperglycemia: Secondary | ICD-10-CM

## 2023-03-02 DIAGNOSIS — Z794 Long term (current) use of insulin: Secondary | ICD-10-CM

## 2023-03-02 NOTE — Progress Notes (Unsigned)
   03/02/2023 Name: Jacob Fowler MRN: [id number] DOB: Oct 13, 1950  No chief complaint on file.   Jacob Fowler is a 72 y.o. year old male who presented for a telephone visit. I connected with  Betzalel Dohner Ingwersen on 03/02/23 by telephone and verified that I am speaking with the correct person using two identifiers. I discussed the limitations of evaluation and management by telemedicine. The patient expressed understanding and agreed to proceed.  Patient was located in her home and PharmD in PCP office during this visit.    They were referred to the pharmacist by {referredtopharmacy:27270} for assistance in managing {referralreason:27271}.   Subjective:  Care Team: Primary Care Provider: Bennie Pierini, FNP ; Next Scheduled Visit: *** {careteamprovider:27366}  Medication Access/Adherence  Current Pharmacy:  [facility], [place] - [address] [place] [address] Phone: [phone] Fax: [phone]  [facility] [phone] - [place], [place] - [address] [place]  [address] Phone: [phone] Fax: [phone]  Patient reports affordability concerns with their medications: {YES/NO:21197} Patient reports access/transportation concerns to their pharmacy: {YES/NO:21197} Patient reports adherence concerns with their medications:  {YES/NO:21197} ***   Diabetes:  Current medications:  Lantus Medications tried in the past: pioglitazone, metformin, farxiga,    Current glucose readings: 150-250 FBG Using *** meter; testing *** times daily  -Patient is testing blood sugar 6 times daily -Patient is injecting insulin 4 or more times daily -He would greatly benefit from a continuous glucose monitoring system (I.e. libre or dexcom)  Patient {Actions; denies-reports:120008} hypoglycemic s/sx including ***dizziness, shakiness, sweating. Patient {Actions; denies-reports:120008}  hyperglycemic symptoms including ***polyuria, polydipsia, polyphagia, nocturia, neuropathy, blurred vision.  Current meal patterns:  - Breakfast: *** - Lunch *** - Supper *** - Snacks *** - Drinks ***  Current physical activity: ***  Current medication access support: ***   Objective:  Lab Results  Component Value Date   HGBA1C 10.4 (H) 01/26/2023    Lab Results  Component Value Date   CREATININE 2.45 (H) 01/26/2023   BUN 49 (H) 01/26/2023   NA 126 (L) 01/26/2023   K 4.8 01/26/2023   CL 91 (L) 01/26/2023   CO2 19 (L) 01/26/2023    Lab Results  Component Value Date   CHOL 104 01/26/2023   HDL 25 (L) 01/26/2023   LDLCALC 54 01/26/2023   TRIG 145 01/26/2023   CHOLHDL 4.2 01/26/2023    Medications Reviewed Today   Medications were not reviewed in this encounter       Assessment/Plan:   {Pharmacy A/P Choices:26421}  Follow Up Plan: ***  ***

## 2023-03-05 DIAGNOSIS — R809 Proteinuria, unspecified: Secondary | ICD-10-CM | POA: Diagnosis not present

## 2023-03-05 DIAGNOSIS — N390 Urinary tract infection, site not specified: Secondary | ICD-10-CM | POA: Diagnosis not present

## 2023-03-05 DIAGNOSIS — E1122 Type 2 diabetes mellitus with diabetic chronic kidney disease: Secondary | ICD-10-CM | POA: Diagnosis not present

## 2023-03-05 DIAGNOSIS — E1129 Type 2 diabetes mellitus with other diabetic kidney complication: Secondary | ICD-10-CM | POA: Diagnosis not present

## 2023-03-06 MED ORDER — SEMAGLUTIDE(0.25 OR 0.5MG/DOS) 2 MG/3ML ~~LOC~~ SOPN
PEN_INJECTOR | SUBCUTANEOUS | 0 refills | Status: AC
Start: 2023-03-06 — End: ?

## 2023-03-12 ENCOUNTER — Telehealth: Payer: Self-pay

## 2023-03-12 NOTE — Telephone Encounter (Signed)
Jacob Fowler (Key: [id number]) Rx #: [id number] Ozempic (0.25 or 0.5 MG/DOSE) 2MG /3ML pen-injectors Form [facility] [employer] Medicare Electronic Prior Authorization Form 2017 NCPDP Created 6 days ago Sent to Plan 2 minutes ago Plan Response 2 minutes ago Submit Clinical Questions less than a minute ago Determination Favorable less than a minute ago Message from Plan [id number]:26-AUG-25 Ozempic (0.25 or 0.5 MG/DOSE) 2MG /3ML [name] SOPN Quantity:3;

## 2023-03-14 ENCOUNTER — Ambulatory Visit: Payer: PPO

## 2023-03-14 ENCOUNTER — Telehealth: Payer: Self-pay | Admitting: Urology

## 2023-03-14 ENCOUNTER — Telehealth: Payer: Self-pay

## 2023-03-14 DIAGNOSIS — N390 Urinary tract infection, site not specified: Secondary | ICD-10-CM

## 2023-03-14 LAB — URINALYSIS, ROUTINE W REFLEX MICROSCOPIC
Bilirubin, UA: NEGATIVE
Ketones, UA: NEGATIVE
Nitrite, UA: NEGATIVE
Specific Gravity, UA: 1.015 (ref 1.005–1.030)
Urobilinogen, Ur: 0.2 mg/dL (ref 0.2–1.0)
pH, UA: 7 (ref 5.0–7.5)

## 2023-03-14 LAB — MICROSCOPIC EXAMINATION
RBC, Urine: >30 /HPF — AB (ref 0–2)
WBC, UA: >30 /HPF — AB (ref 0–5)

## 2023-03-14 MED ORDER — SULFAMETHOXAZOLE-TRIMETHOPRIM 800-160 MG PO TABS
1.0000 | ORAL_TABLET | Freq: Two times a day (BID) | ORAL | 0 refills | Status: AC
Start: 2023-03-14 — End: ?

## 2023-03-14 NOTE — Telephone Encounter (Signed)
Daughter made aware of antibiotic and that we will reach out with culture results.

## 2023-03-14 NOTE — Telephone Encounter (Signed)
Patient daughter called she said that the patients urine is cloudy.  She would like a call back she thinks he may have a infection.   She called the after hour service and they told her to call his PCP with his blood sugar being elevated?

## 2023-03-14 NOTE — Telephone Encounter (Signed)
Patient's Daughter dropped off patient's urine specimen.  Please call Mitzi with results at [phone]

## 2023-03-14 NOTE — Telephone Encounter (Signed)
Patient daughter called again wants to know if she should bring a sample down  ?

## 2023-03-14 NOTE — Telephone Encounter (Signed)
Return call to patient's daughter. Daughter states that she think patient may have another UTI, Mitiz states that patient has cloudy, deep yellow urine. Mitiz is aware to do a urine drop off  per Dr. Ronne Binning.

## 2023-03-14 NOTE — Progress Notes (Signed)
Patient presents today with complaints of  Cloudy and dark yellow urine .  UA and Culture done today.  Dr. Ronne Binning reviewed results and Bactrim .  Patient aware of MD recommendations and that we will reach out with culture results.      GNFAOZHY, CMA

## 2023-03-15 ENCOUNTER — Encounter: Payer: Self-pay | Admitting: Family Medicine

## 2023-03-15 ENCOUNTER — Telehealth (INDEPENDENT_AMBULATORY_CARE_PROVIDER_SITE_OTHER): Payer: PPO | Admitting: Family Medicine

## 2023-03-15 DIAGNOSIS — U071 COVID-19: Secondary | ICD-10-CM

## 2023-03-15 MED ORDER — GUAIFENESIN ER 600 MG PO TB12
600.0000 mg | ORAL_TABLET | Freq: Two times a day (BID) | ORAL | 0 refills | Status: AC
Start: 2023-03-15 — End: 2023-03-25

## 2023-03-15 MED ORDER — MOLNUPIRAVIR EUA 200MG CAPSULE
4.0000 | ORAL_CAPSULE | Freq: Two times a day (BID) | ORAL | 0 refills | Status: AC
Start: 2023-03-15 — End: 2023-03-20

## 2023-03-15 NOTE — Progress Notes (Signed)
Virtual Visit via Video   I connected with patient on 03/15/23 at 0815 by a video enabled telemedicine application and verified that I am speaking with the correct person using two identifiers.  Location patient: Home Location provider: [facility] Persons participating in the virtual visit: Patient and Provider  I discussed the limitations of evaluation and management by telemedicine and the availability of in person appointments. The patient expressed understanding and agreed to proceed.  Subjective:   HPI:  Pt presents today for  Chief Complaint  Patient presents with   Covid Positive   Pt reports cough and congestion that started on Tuesday. Fever and chills yesterday. He tested positive for COVID this morning. He has not been taking anything for symptom management.   Review of Systems  Constitutional:  Positive for chills, fever and malaise/fatigue. Negative for diaphoresis and weight loss.  HENT:  Positive for congestion. Negative for ear discharge, ear pain, hearing loss, nosebleeds, sinus pain, sore throat and tinnitus.   Respiratory:  Positive for cough. Negative for hemoptysis, sputum production, shortness of breath, wheezing and stridor.   Cardiovascular:  Negative for chest pain.  Skin:  Negative for itching and rash.  All other systems reviewed and are negative.    Patient Active Problem List   Diagnosis Date Noted   Recurrent UTI 01/22/2023   History of bladder stone 01/22/2023   Inguinal hernia 11/30/2020   BPH with obstruction/lower urinary tract symptoms 11/30/2020   Hypothyroidism 07/28/2020   Gastroesophageal reflux disease 07/28/2020   B12 deficiency 07/28/2020   Multiple falls 02/09/2020   Memory loss 08/11/2019   Vitamin D deficiency 04/25/2019   Irritable bowel syndrome with constipation 04/25/2019   RBBB 08/14/2018   Combined forms of age-related cataract of right eye 08/30/2017   Aortic atherosclerosis (HCC)  04/10/2016   Rosacea 11/23/2015   Right foot drop 02/17/2015   Spastic gait 09/16/2014   Multiple sclerosis (HCC) 01/06/2013   Atherosclerosis of native coronary artery of native heart with stable angina pectoris (HCC) 10/06/2009   Uncontrolled type 2 diabetes mellitus with hyperglycemia, without long-term current use of insulin (HCC) 10/01/2009   Hyperlipidemia associated with type 2 diabetes mellitus (HCC) 10/01/2009   Hypertension associated with type 2 diabetes mellitus (HCC) 10/01/2009    Social History   Tobacco Use   Smoking status: Former    Current packs/day: 0.00    Average packs/day: 1 pack/day for 7.0 years (7.0 ttl pk-yrs)    Types: Cigarettes    Start date: 09/27/1966    Quit date: 09/26/1973    Years since quitting: 49.4   Smokeless tobacco: Current    Types: Chew   Tobacco comments:    Chewing tobacco  Substance Use Topics   Alcohol use: No    Alcohol/week: 0.0 standard drinks of alcohol    Current Outpatient Medications:    guaiFENesin (MUCINEX) 600 MG 12 hr tablet, Take 1 tablet (600 mg total) by mouth 2 (two) times daily for 10 days., Disp: 20 tablet, Rfl: 0   molnupiravir EUA (LAGEVRIO) 200 mg CAPS capsule, Take 4 capsules (800 mg total) by mouth 2 (two) times daily for 5 days., Disp: 40 capsule, Rfl: 0   Ascorbic Acid (VITAMIN C PO), Take by mouth., Disp: , Rfl:    aspirin 81 MG EC tablet, Take 81 mg by mouth daily., Disp: , Rfl:    Cholecalciferol (VITAMIN D) 50 MCG (2000 UT) tablet, Take 2,000 Units by mouth daily., Disp: , Rfl:  cyanocobalamin (VITAMIN B12) 1000 MCG/ML injection, INJECT INTO THE SKIN ONCE A MONTH, Disp: 3 mL, Rfl: 5   ferrous sulfate 325 (65 FE) MG tablet, Take 1 tablet (325 mg total) by mouth daily with breakfast., Disp: 90 tablet, Rfl: 1   finasteride (PROSCAR) 5 MG tablet, Take 1 tablet (5 mg total) by mouth daily., Disp: 90 tablet, Rfl: 3   fluconazole (DIFLUCAN) 200 MG tablet, SMARTSIG:1 Tablet(s) By Mouth 1-2 Times Daily, Disp:  , Rfl:    glucose blood test strip, Use as instructed, Disp: 100 each, Rfl: 12   insulin glargine (LANTUS SOLOSTAR) 100 UNIT/ML Solostar Pen, Inject 55 Units into the skin daily., Disp: 30 mL, Rfl: 11   Insulin Pen Needle 31G X 5 MM MISC, Use with lantus pen as directed, Disp: 100 each, Rfl: 2   isosorbide mononitrate (IMDUR) 60 MG 24 hr tablet, TAKE 1/2 TABLET (30MG  TOTAL) BY MOUTH DAILY, Disp: 45 tablet, Rfl: 0   levothyroxine (SYNTHROID) 75 MCG tablet, TAKE ONE TABLET ( TOTAL) BY MOUTH DAILY BEFORE BREAKFAST, Disp: 90 tablet, Rfl: 0   magnesium oxide (MAG-OX) 400 MG tablet, Take 1 tablet by mouth daily., Disp: , Rfl:    Menthol-Zinc Oxide (CALMOSEPTINE) 0.44-20.6 % OINT, Apply small amount o affected are BID, Disp: 113 g, Rfl: 2   mirabegron ER (MYRBETRIQ) 25 MG TB24 tablet, Take 1 tablet (25 mg total) by mouth daily., Disp: 30 tablet, Rfl: 5   olmesartan (BENICAR) 5 MG tablet, Take 1 tablet (5 mg total) by mouth daily., Disp: 90 tablet, Rfl: 1   omeprazole (PRILOSEC) 20 MG capsule, Take 1 capsule (20 mg total) by mouth daily., Disp: 90 capsule, Rfl: 1   penicillin v potassium (VEETID) 500 MG tablet, Take 500 mg by mouth 2 (two) times daily., Disp: , Rfl:    rosuvastatin (CRESTOR) 10 MG tablet, Take 1 tablet (10 mg total) by mouth daily., Disp: 90 tablet, Rfl: 0   Semaglutide,0.25 or 0.5MG /DOS, 2 MG/3ML SOPN, Inject 0.25mg  into skin weekly for 4 weeks, then increase to 0.5mg  weekly thereafter. DX:E11.65, Disp: 3 mL, Rfl: 0   silodosin (RAPAFLO) 8 MG CAPS capsule, Take 1 capsule (8 mg total) by mouth at bedtime., Disp: 90 capsule, Rfl: 3   sodium bicarbonate 650 MG tablet, Take 650 mg by mouth 4 (four) times daily., Disp: , Rfl:    sodium citrate-citric acid (ORACIT) 500-334 MG/5ML solution, Take 15 mLs by mouth 2 (two) times daily., Disp: , Rfl:    sulfamethoxazole-trimethoprim (BACTRIM DS) 800-160 MG tablet, Take 1 tablet by mouth every 12 (twelve) hours., Disp: 14 tablet, Rfl:  0  Allergies  Allergen Reactions   Farxiga [Dapagliflozin] Other (See Comments)    unable to take Marcelline Deist due to recurrent UTIs, urinary retention and hydronephrosis    Chauncey Mann [Finerenone] Other (See Comments)    hyperkalemia    Objective:   There were no vitals taken for this visit.  Patient is well-developed, well-nourished in no acute distress.  Resting comfortably at home.  Head is normocephalic, atraumatic.  No labored breathing.  Speech is clear and coherent with logical content.  Patient is alert and oriented at baseline.   Assessment and Plan:   Jacob "Ree Kida" was seen today for covid positive.  Diagnoses and all orders for this visit:  Positive self-administered antigen test for COVID-19 COVID positive, will start antivirals and Mucinex. Aware to increase water intake. Can take tylenol as needed for fever and pain control. Aware to report new, worsening, or persistent  symptoms.  -     molnupiravir EUA (LAGEVRIO) 200 mg CAPS capsule; Take 4 capsules (800 mg total) by mouth 2 (two) times daily for 5 days. -     guaiFENesin (MUCINEX) 600 MG 12 hr tablet; Take 1 tablet (600 mg total) by mouth 2 (two) times daily for 10 days.      Return if symptoms worsen or fail to improve.  Kari Baars, FNP-C [facility] [address] [place], [place] [address] [phone]  03/15/2023  Time spent with the patient: 15 minutes, of which >50% was spent in obtaining information about symptoms, reviewing previous labs, evaluations, and treatments, counseling about condition (please see the discussed topics above), and developing a plan to further investigate it; had a number of questions which I addressed.

## 2023-03-16 LAB — URINE CULTURE

## 2023-03-20 NOTE — Progress Notes (Signed)
Letter sent.

## 2023-03-21 ENCOUNTER — Ambulatory Visit (HOSPITAL_COMMUNITY): Payer: PPO

## 2023-03-26 ENCOUNTER — Telehealth: Payer: Self-pay | Admitting: Nurse Practitioner

## 2023-03-26 ENCOUNTER — Telehealth (INDEPENDENT_AMBULATORY_CARE_PROVIDER_SITE_OTHER): Payer: PPO | Admitting: Nurse Practitioner

## 2023-03-26 ENCOUNTER — Ambulatory Visit: Payer: PPO | Admitting: Urology

## 2023-03-26 ENCOUNTER — Encounter: Payer: Self-pay | Admitting: Nurse Practitioner

## 2023-03-26 ENCOUNTER — Telehealth (INDEPENDENT_AMBULATORY_CARE_PROVIDER_SITE_OTHER): Payer: PPO | Admitting: Family Medicine

## 2023-03-26 DIAGNOSIS — U099 Post covid-19 condition, unspecified: Secondary | ICD-10-CM | POA: Diagnosis not present

## 2023-03-26 DIAGNOSIS — R053 Chronic cough: Secondary | ICD-10-CM | POA: Diagnosis not present

## 2023-03-26 MED ORDER — BENZONATATE 100 MG PO CAPS: 100.0000 mg | ORAL_CAPSULE | Freq: Two times a day (BID) | ORAL | 0 refills | Status: DC | PRN

## 2023-03-26 NOTE — Progress Notes (Signed)
Patient's daughter informed me that she cannot do video visit.  Are sending a message into PCP

## 2023-03-26 NOTE — Telephone Encounter (Signed)
Pts daughter called stating that she is home with pt right now but she has to be at work at [employer] today. Is wondering if video visit can be done now? Says covid medicine did not really help pt and needs different medicine sent in or something done to help him.

## 2023-03-26 NOTE — Telephone Encounter (Signed)
Called and spoke with daughter and scheduled a telephone visit for dad

## 2023-03-26 NOTE — Patient Instructions (Signed)
1. Take meds as prescribed °2. Use a cool mist humidifier especially during the winter months and when heat has been humid. °3. Use saline nose sprays frequently °4. Saline irrigations of the nose can be very helpful if done frequently. ° * 4X daily for 1 week* ° * Use of a nettie pot can be helpful with this. Follow directions with this* °5. Drink plenty of fluids °6. Keep thermostat turn down low °7.For any cough or congestion- tessalon perles °8. For fever or aces or pains- take tylenol or ibuprofen appropriate for age and weight. ° * for fevers greater than 101 orally you may alternate ibuprofen and tylenol every  3 hours. °  ° ° °

## 2023-03-26 NOTE — Progress Notes (Signed)
Virtual Visit  Note Due to COVID-19 pandemic this visit was conducted virtually. This visit type was conducted due to national recommendations for restrictions regarding the COVID-19 Pandemic (e.g. social distancing, sheltering in place) in an effort to limit this patient's exposure and mitigate transmission in our community. All issues noted in this document were discussed and addressed.  A physical exam was not performed with this format.  I connected with Jacob Fowler on 03/26/23 at 2:24 by telephone and verified that I am speaking with the correct person using two identifiers. Jacob Fowler is currently located at home and his wife is currently with him during visit. The provider, Mary-Margaret Daphine Deutscher, FNP is located in their office at time of visit.  I discussed the limitations, risks, security and privacy concerns of performing an evaluation and management service by telephone and the availability of in person appointments. I also discussed with the patient that there may be a patient responsible charge related to this service. The patient expressed understanding and agreed to proceed.   History and Present Illness:  Patient has history of covid 2 weeks ago and still has a real bad cough. Has no fever currently. Just cannot catch his breath from coughing so much.is using delsym OTC which is not helping.    Review of Systems  Constitutional:  Negative for chills, fever and malaise/fatigue.  HENT:  Positive for congestion.   Respiratory:  Positive for cough.   Neurological:  Negative for dizziness.     Observations/Objective: Alert and oriented- answers all questions appropriately No distress Deep wet cough during visit  Assessment and Plan: Jacob Fowler in today with chief complaint of No chief complaint on file.   1. Post-COVID chronic cough 1. Take meds as prescribed 2. Use a cool mist humidifier especially during the winter months and when  heat has been humid. 3. Use saline nose sprays frequently 4. Saline irrigations of the nose can be very helpful if done frequently.  * 4X daily for 1 week*  * Use of a nettie pot can be helpful with this. Follow directions with this* 5. Drink plenty of fluids 6. Keep thermostat turn down low 7.For any cough or congestion- tessalon perles 8. For fever or aces or pains- take tylenol or ibuprofen appropriate for age and weight.  * for fevers greater than 101 orally you may alternate ibuprofen and tylenol every  3 hours.    - benzonatate (TESSALON) 100 MG capsule; Take 1 capsule (100 mg total) by mouth 2 (two) times daily as needed for cough.  Dispense: 20 capsule; Refill: 0   Follow Up Instructions: prn    I discussed the assessment and treatment plan with the patient. The patient was provided an opportunity to ask questions and all were answered. The patient agreed with the plan and demonstrated an understanding of the instructions.   The patient was advised to call back or seek an in-person evaluation if the symptoms worsen or if the condition fails to improve as anticipated.  The above assessment and management plan was discussed with the patient. The patient verbalized understanding of and has agreed to the management plan. Patient is aware to call the clinic if symptoms persist or worsen. Patient is aware when to return to the clinic for a follow-up visit. Patient educated on when it is appropriate to go to the emergency department.   Time call ended:  2:34  I provided 10 minutes of  non face-to-face time during this encounter.  Mary-Margaret Daphine Deutscher, FNP

## 2023-04-16 ENCOUNTER — Other Ambulatory Visit: Payer: Self-pay | Admitting: Nurse Practitioner

## 2023-04-16 ENCOUNTER — Ambulatory Visit (INDEPENDENT_AMBULATORY_CARE_PROVIDER_SITE_OTHER): Payer: PPO | Admitting: Nurse Practitioner

## 2023-04-16 ENCOUNTER — Encounter: Payer: Self-pay | Admitting: Nurse Practitioner

## 2023-04-16 VITALS — BP 129/79 | HR 96 | Temp 97.7°F | Resp 20

## 2023-04-16 DIAGNOSIS — N401 Enlarged prostate with lower urinary tract symptoms: Secondary | ICD-10-CM

## 2023-04-16 DIAGNOSIS — K219 Gastro-esophageal reflux disease without esophagitis: Secondary | ICD-10-CM

## 2023-04-16 DIAGNOSIS — E1169 Type 2 diabetes mellitus with other specified complication: Secondary | ICD-10-CM

## 2023-04-16 DIAGNOSIS — E538 Deficiency of other specified B group vitamins: Secondary | ICD-10-CM

## 2023-04-16 DIAGNOSIS — I152 Hypertension secondary to endocrine disorders: Secondary | ICD-10-CM

## 2023-04-16 DIAGNOSIS — N138 Other obstructive and reflux uropathy: Secondary | ICD-10-CM | POA: Diagnosis not present

## 2023-04-16 DIAGNOSIS — E1159 Type 2 diabetes mellitus with other circulatory complications: Secondary | ICD-10-CM | POA: Diagnosis not present

## 2023-04-16 DIAGNOSIS — Z23 Encounter for immunization: Secondary | ICD-10-CM | POA: Diagnosis not present

## 2023-04-16 DIAGNOSIS — E1165 Type 2 diabetes mellitus with hyperglycemia: Secondary | ICD-10-CM | POA: Diagnosis not present

## 2023-04-16 DIAGNOSIS — E039 Hypothyroidism, unspecified: Secondary | ICD-10-CM

## 2023-04-16 DIAGNOSIS — K581 Irritable bowel syndrome with constipation: Secondary | ICD-10-CM

## 2023-04-16 DIAGNOSIS — E785 Hyperlipidemia, unspecified: Secondary | ICD-10-CM | POA: Diagnosis not present

## 2023-04-16 DIAGNOSIS — E559 Vitamin D deficiency, unspecified: Secondary | ICD-10-CM

## 2023-04-16 DIAGNOSIS — R339 Retention of urine, unspecified: Secondary | ICD-10-CM

## 2023-04-16 DIAGNOSIS — G35 Multiple sclerosis: Secondary | ICD-10-CM

## 2023-04-16 MED ORDER — LANTUS SOLOSTAR 100 UNIT/ML ~~LOC~~ SOPN
55.0000 [IU] | PEN_INJECTOR | Freq: Every day | SUBCUTANEOUS | 11 refills | Status: DC
Start: 2023-04-16 — End: 2023-07-30

## 2023-04-16 MED ORDER — OLMESARTAN MEDOXOMIL 5 MG PO TABS
5.0000 mg | ORAL_TABLET | Freq: Every day | ORAL | 1 refills | Status: DC
Start: 2023-04-16 — End: 2023-07-30

## 2023-04-16 MED ORDER — MIRABEGRON ER 25 MG PO TB24
25.0000 mg | ORAL_TABLET | Freq: Every day | ORAL | 1 refills | Status: DC
Start: 2023-04-16 — End: 2023-05-28

## 2023-04-16 MED ORDER — OMEPRAZOLE 20 MG PO CPDR: 20.0000 mg | DELAYED_RELEASE_CAPSULE | Freq: Every day | ORAL | 1 refills | Status: DC

## 2023-04-16 MED ORDER — ISOSORBIDE MONONITRATE ER 60 MG PO TB24: ORAL_TABLET | ORAL | 1 refills | Status: DC

## 2023-04-16 MED ORDER — INSULIN PEN NEEDLE 31G X 5 MM MISC: 2 refills | Status: DC

## 2023-04-16 MED ORDER — ROSUVASTATIN CALCIUM 10 MG PO TABS
10.0000 mg | ORAL_TABLET | Freq: Every day | ORAL | 1 refills | Status: DC
Start: 2023-04-16 — End: 2023-09-03

## 2023-04-16 MED ORDER — SILODOSIN 8 MG PO CAPS: 8.0000 mg | ORAL_CAPSULE | Freq: Every day | ORAL | 3 refills | Status: DC

## 2023-04-16 MED ORDER — LEVOTHYROXINE SODIUM 75 MCG PO TABS: ORAL_TABLET | ORAL | 1 refills | Status: DC

## 2023-04-16 MED ORDER — SEMAGLUTIDE(0.25 OR 0.5MG/DOS) 2 MG/3ML ~~LOC~~ SOPN: PEN_INJECTOR | SUBCUTANEOUS | 1 refills | Status: DC

## 2023-04-16 NOTE — Progress Notes (Signed)
Subjective:    [name], male    DOB: [birth year], 72 y.o.   MRN: [id number]   Chief Complaint: medical management of chronic issues     HPI:  Jacob Fowler is a 72 y.o. who identifies as a male who was assigned male at birth.   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 Uncontrolled type 2 diabetes mellitus with hyperglycemia, without long-term current use of insulin (HCC) Blood sugars have been running around 150. Has had low in the 60's when he was sick with covid.  Lab Results  Component Value Date   HGBA1C 10.4 (H) 01/26/2023     2. Hypertension associated with type 2 diabetes mellitus (HCC) No c/o chest pain, sob or headache. Does not check blood pressure at home. BP Readings from Last 3 Encounters:  02/14/23 91/64  01/26/23 95/62  01/22/23 100/65     3. Hyperlipidemia associated with type 2 diabetes mellitus (HCC) Eats what ever his family fixes him to eat. Is not able to do any exercise.  Lab Results  Component Value Date   CHOL 104 01/26/2023   HDL 25 (L) 01/26/2023   LDLCALC 54 01/26/2023   TRIG 145 01/26/2023   CHOLHDL 4.2 01/26/2023    4. Gastroesophageal reflux disease, unspecified whether esophagitis present Is on omeprazole and is doing well.  5. Irritable bowel syndrome with constipation No recent flare ups with family doing diet control for him.  6. Acquired hypothyroidism No issues that he is aware of. Lab Results  Component Value Date   TSH 4.940 (H) 01/26/2023     7. Multiple sclerosis (HCC) Is confined to bed and wheel chair. He and his family deny in decubitus ulcers.  8. B12 deficiency Family gives him monthly b12 injections.  9. Vitamin D deficiency Is on daily vitamin d supplement   New complaints: None  today  Allergies  Allergen Reactions   Farxiga [Dapagliflozin]  Other (See Comments)    unable to take Marcelline Deist due to recurrent UTIs, urinary retention and hydronephrosis    Chauncey Mann [Finerenone] Other (See Comments)    hyperkalemia   Outpatient Encounter Medications as of 04/16/2023  Medication Sig   Ascorbic Acid (VITAMIN C PO) Take by mouth.   aspirin 81 MG EC tablet Take 81 mg by mouth daily.   benzonatate (TESSALON) 100 MG capsule Take 1 capsule (100 mg total) by mouth 2 (two) times daily as needed for cough.   Cholecalciferol (VITAMIN D) 50 MCG (2000 UT) tablet Take 2,000 Units by mouth daily.   cyanocobalamin (VITAMIN B12) 1000 MCG/ML injection INJECT INTO THE SKIN ONCE A MONTH   ferrous sulfate 325 (65 FE) MG tablet Take 1 tablet (325 mg total) by mouth daily with breakfast.   finasteride (PROSCAR) 5 MG tablet Take 1 tablet (5 mg total) by mouth daily.   fluconazole (DIFLUCAN) 200 MG tablet SMARTSIG:1 Tablet(s) By Mouth 1-2 Times Daily   glucose blood test strip Use as instructed   insulin glargine (LANTUS SOLOSTAR) 100 UNIT/ML Solostar Pen Inject 55 Units into the skin daily.   Insulin Pen Needle 31G X 5 MM MISC Use with lantus pen as directed   isosorbide mononitrate (IMDUR) 60 MG 24 hr tablet TAKE 1/2 TABLET (30MG  TOTAL) BY MOUTH DAILY   levothyroxine (SYNTHROID) 75 MCG tablet TAKE ONE TABLET ( TOTAL) BY MOUTH  DAILY BEFORE BREAKFAST   magnesium oxide (MAG-OX) 400 MG tablet Take 1 tablet by mouth daily.   Menthol-Zinc Oxide (CALMOSEPTINE) 0.44-20.6 % OINT Apply small amount o affected are BID   mirabegron ER (MYRBETRIQ) 25 MG TB24 tablet Take 1 tablet (25 mg total) by mouth daily.   olmesartan (BENICAR) 5 MG tablet Take 1 tablet (5 mg total) by mouth daily.   omeprazole (PRILOSEC) 20 MG capsule Take 1 capsule (20 mg total) by mouth daily.   penicillin v potassium (VEETID) 500 MG tablet Take 500 mg by mouth 2 (two) times daily.   rosuvastatin (CRESTOR) 10 MG tablet Take 1 tablet (10 mg total) by mouth daily.   Semaglutide,0.25 or  0.5MG /DOS, 2 MG/3ML SOPN Inject 0.25mg  into skin weekly for 4 weeks, then increase to 0.5mg  weekly thereafter. DX:E11.65   silodosin (RAPAFLO) 8 MG CAPS capsule Take 1 capsule (8 mg total) by mouth at bedtime.   sodium bicarbonate 650 MG tablet Take 650 mg by mouth 4 (four) times daily.   sodium citrate-citric acid (ORACIT) 500-334 MG/5ML solution Take 15 mLs by mouth 2 (two) times daily.   sulfamethoxazole-trimethoprim (BACTRIM DS) 800-160 MG tablet Take 1 tablet by mouth every 12 (twelve) hours.   [DISCONTINUED] atorvastatin (LIPITOR) 10 MG tablet Take 10 mg by mouth daily.   [DISCONTINUED] niacin-simvastatin (SIMCOR) 500-20 MG 24 hr tablet Take 1 tablet by mouth at bedtime.     No facility-administered encounter medications on file as of 04/16/2023.    Past Surgical History:  Procedure Laterality Date   CARDIAC CATHETERIZATION  02-26-2009 ---  DR Julien Nordmann   NONOBSTRUCTIVE CAD/ 50% DISTAL RCA/ 30% LAD / NORMAL LVF   CYSTOSCOPY W/ RETROGRADES  10/30/2011   Procedure: CYSTOSCOPY WITH RETROGRADE PYELOGRAM;  Surgeon: Garnett Farm, MD;  Location: [facility];  Service: Urology;  Laterality: Right;   CYSTOSCOPY WITH LITHOLAPAXY  10/30/2011   Procedure: CYSTOSCOPY WITH LITHOLAPAXY;  Surgeon: Garnett Farm, MD;  Location: [facility];  Service: Urology;  Laterality: N/A;   CYSTOSCOPY WITH RETROGRADE PYELOGRAM, URETEROSCOPY AND STENT PLACEMENT Left 11/25/2020   Procedure: CYSTOSCOPY WITH LEFT RETROGRADE PYELOGRAM, LEFT URETEROSCOPY WITH LASER AND LEFT URETERAL STENT PLACEMENT;  Surgeon: Malen Gauze, MD;  Location: [facility];  Service: Urology;  Laterality: Left;   EXTRACORPOREAL SHOCK WAVE LITHOTRIPSY  09/05/2010   RIGHT   EYE SURGERY Right 08/30/2018   HOLMIUM LASER APPLICATION Left 11/25/2020   Procedure: HOLMIUM LASER APPLICATION;  Surgeon: Malen Gauze, MD;  Location: [facility];  Service: Urology;  Laterality: Left;   NESBIT PROCEDURE  10/17/2004    CORRECTION OF PENILE ANGULATION (PEYRONIES DISEASE)   UTI  06/17/2022    Family History  Problem Relation Age of Onset   Stroke Mother    Heart attack Mother    Diabetes Mother    Hypertension Mother    Renal cancer Father    Diabetes Sister    Heart attack Brother    Diabetes Son    Heart attack Maternal Grandfather    Heart attack Brother    Bladder Cancer Brother       Controlled substance contract: n/a     Review of Systems  Constitutional:  Negative for diaphoresis.  Eyes:  Negative for pain.  Respiratory:  Negative for shortness of breath.   Cardiovascular:  Negative for chest pain, palpitations and leg swelling.  Gastrointestinal:  Negative for abdominal pain.  Endocrine: Negative for polydipsia.  Skin:  Negative for rash.  Neurological:  Negative for dizziness, weakness and headaches.  Hematological:  Does not bruise/bleed easily.  All other systems reviewed and are negative.      Objective:   Physical Exam Vitals and nursing note reviewed.  Constitutional:      Appearance: Normal appearance. He is well-developed.  HENT:     Head: Normocephalic.     Nose: Nose normal.     Mouth/Throat:     Mouth: Mucous membranes are moist.     Pharynx: Oropharynx is clear.  Eyes:     Pupils: Pupils are equal, round, and reactive to light.  Neck:     Thyroid: No thyroid mass or thyromegaly.     Vascular: No carotid bruit or JVD.     Trachea: Phonation normal.  Cardiovascular:     Rate and Rhythm: Normal rate and regular rhythm.  Pulmonary:     Effort: Pulmonary effort is normal. No respiratory distress.     Breath sounds: Normal breath sounds.  Abdominal:     General: Bowel sounds are normal.     Palpations: Abdomen is soft.     Tenderness: There is no abdominal tenderness.  Musculoskeletal:        General: Normal range of motion.     Cervical back: Normal range of motion and neck supple.     Comments: In wheelchair Not able to stand at all Weakness in  all extremities  Lymphadenopathy:     Cervical: No cervical adenopathy.  Skin:    General: Skin is warm and dry.  Neurological:     Mental Status: He is alert and oriented to person, place, and time.  Psychiatric:        Behavior: Behavior normal.        Thought Content: Thought content normal.        Judgment: Judgment normal.    BP 129/79   Pulse 96   Temp 97.7 F (36.5 C) (Temporal)   Resp 20   SpO2 95%   HGBAc1 8.6%      Assessment & Plan:   Jacob Fowler comes in today with chief complaint of Medical Management of Chronic Issues   Diagnosis and orders addressed:  1. Uncontrolled type 2 diabetes mellitus with hyperglycemia, without long-term current use of insulin (HCC) Cntinue current diet - Bayer DCA Hb A1c Waived - rosuvastatin (CRESTOR) 10 MG tablet; Take 1 tablet (10 mg total) by mouth daily.  Dispense: 90 tablet; Refill: 1 - insulin glargine (LANTUS SOLOSTAR) 100 UNIT/ML Solostar Pen; Inject 55 Units into the skin daily.  Dispense: 30 mL; Refill: 11 - Semaglutide,0.25 or 0.5MG /DOS, 2 MG/3ML SOPN; Inject 0.25mg  into skin weekly for 4 weeks, then increase to 0.5mg  weekly thereafter. DX:E11.65  Dispense: 3 mL; Refill: 1  2. Hypertension associated with type 2 diabetes mellitus (HCC) Low sodium diet - CBC with Differential/Platelet - CMP14+EGFR - isosorbide mononitrate (IMDUR) 60 MG 24 hr tablet; TAKE 1/2 TABLET (30MG  TOTAL) BY MOUTH DAILY  Dispense: 45 tablet; Refill: 1 - olmesartan (BENICAR) 5 MG tablet; Take 1 tablet (5 mg total) by mouth daily.  Dispense: 90 tablet; Refill: 1  3. Hyperlipidemia associated with type 2 diabetes mellitus (HCC) Low fat diet - Lipid panel - rosuvastatin (CRESTOR) 10 MG tablet; Take 1 tablet (10 mg total) by mouth daily.  Dispense: 90 tablet; Refill: 1  4. Gastroesophageal reflux disease, unspecified whether esophagitis present Avoid spicy foods Do not eat 2 hours prior to bedtime - omeprazole (PRILOSEC) 20 MG  capsule; Take  1 capsule (20 mg total) by mouth daily.  Dispense: 90 capsule; Refill: 1  5. Irritable bowel syndrome with constipation  6. Acquired hypothyroidism Labs pending - levothyroxine (SYNTHROID) 75 MCG tablet; TAKE ONE TABLET ( TOTAL) BY MOUTH DAILY BEFORE BREAKFAST  Dispense: 90 tablet; Refill: 1 - Thyroid Panel With TSH  7. Multiple sclerosis (HCC) Continue  to move often to prevent bed sores. - mirabegron ER (MYRBETRIQ) 25 MG TB24 tablet; Take 1 tablet (25 mg total) by mouth daily.  Dispense: 90 tablet; Refill: 1  8. B12 deficiency Continue monthly b12  9. Vitamin D deficiency Continue vitamin d supplement  10. Benign prostatic hyperplasia with urinary obstruction - silodosin (RAPAFLO) 8 MG CAPS capsule; Take 1 capsule (8 mg total) by mouth at bedtime.  Dispense: 90 capsule; Refill: 3  11. Incomplete bladder emptying - mirabegron ER (MYRBETRIQ) 25 MG TB24 tablet; Take 1 tablet (25 mg total) by mouth daily.  Dispense: 90 tablet; Refill: 1   Labs pending Health Maintenance reviewed Diet and exercise encouraged  Follow up plan: 6 months   Mary-Margaret Daphine Deutscher, FNP

## 2023-04-17 LAB — LIPID PANEL
Chol/HDL Ratio: 3.5 {ratio} (ref 0.0–5.0)
Cholesterol, Total: 98 mg/dL — ABNORMAL LOW (ref 100–199)
HDL: 28 mg/dL — ABNORMAL LOW (ref >39)
LDL Chol Calc (NIH): 46 mg/dL (ref 0–99)
Triglycerides: 134 mg/dL (ref 0–149)
VLDL Cholesterol Cal: 24 mg/dL (ref 5–40)

## 2023-04-17 LAB — THYROID PANEL WITH TSH
Free Thyroxine Index: 2.2 (ref 1.2–4.9)
T3 Uptake Ratio: 27 % (ref 24–39)
T4, Total: 8 ug/dL (ref 4.5–12.0)
TSH: 4.81 u[IU]/mL — ABNORMAL HIGH (ref 0.450–4.500)

## 2023-04-17 LAB — CBC WITH DIFFERENTIAL/PLATELET
Basophils Absolute: 0.1 10*3/uL (ref 0.0–0.2)
Basos: 1 %
EOS (ABSOLUTE): 0.3 10*3/uL (ref 0.0–0.4)
Eos: 2 %
Hematocrit: 35.9 % — ABNORMAL LOW (ref 37.5–51.0)
Hemoglobin: 11.2 g/dL — ABNORMAL LOW (ref 13.0–17.7)
Immature Grans (Abs): 0.3 10*3/uL — ABNORMAL HIGH (ref 0.0–0.1)
Immature Granulocytes: 2 %
Lymphocytes Absolute: 1.7 10*3/uL (ref 0.7–3.1)
Lymphs: 11 %
MCH: 27.2 pg (ref 26.6–33.0)
MCHC: 31.2 g/dL — ABNORMAL LOW (ref 31.5–35.7)
MCV: 87 fL (ref 79–97)
Monocytes Absolute: 1.1 10*3/uL — ABNORMAL HIGH (ref 0.1–0.9)
Monocytes: 7 %
Neutrophils Absolute: 11.6 10*3/uL — ABNORMAL HIGH (ref 1.4–7.0)
Neutrophils: 77 %
Platelets: 333 10*3/uL (ref 150–450)
RBC: 4.12 x10E6/uL — ABNORMAL LOW (ref 4.14–5.80)
RDW: 14.4 % (ref 11.6–15.4)
WBC: 15 10*3/uL — ABNORMAL HIGH (ref 3.4–10.8)

## 2023-04-17 LAB — CMP14+EGFR
ALT: 16 [IU]/L (ref 0–44)
AST: 14 [IU]/L (ref 0–40)
Albumin: 4.2 g/dL (ref 3.8–4.8)
Alkaline Phosphatase: 119 [IU]/L (ref 44–121)
BUN/Creatinine Ratio: 16 (ref 10–24)
BUN: 36 mg/dL — ABNORMAL HIGH (ref 8–27)
Bilirubin Total: <0.2 mg/dL (ref 0.0–1.2)
CO2: 22 mmol/L (ref 20–29)
Calcium: 9.6 mg/dL (ref 8.6–10.2)
Chloride: 91 mmol/L — ABNORMAL LOW (ref 96–106)
Creatinine, Ser: 2.21 mg/dL — ABNORMAL HIGH (ref 0.76–1.27)
Globulin, Total: 3.2 g/dL (ref 1.5–4.5)
Glucose: 288 mg/dL — ABNORMAL HIGH (ref 70–99)
Potassium: 5.4 mmol/L — ABNORMAL HIGH (ref 3.5–5.2)
Sodium: 129 mmol/L — ABNORMAL LOW (ref 134–144)
Total Protein: 7.4 g/dL (ref 6.0–8.5)
eGFR: 31 mL/min/{1.73_m2} — ABNORMAL LOW (ref >59)

## 2023-04-17 LAB — BAYER DCA HB A1C WAIVED: HB A1C (BAYER DCA - WAIVED): 8.6 % — ABNORMAL HIGH (ref 4.8–5.6)

## 2023-04-17 MED ORDER — LEVOTHYROXINE SODIUM 88 MCG PO TABS
88.0000 ug | ORAL_TABLET | Freq: Every day | ORAL | 3 refills | Status: DC
Start: 2023-04-17 — End: 2023-09-17

## 2023-04-17 NOTE — Addendum Note (Signed)
Addended by: Bennie Pierini on: 04/17/2023 01:12 PM   Modules accepted: Orders

## 2023-05-10 ENCOUNTER — Other Ambulatory Visit: Payer: Self-pay | Admitting: Nurse Practitioner

## 2023-05-10 DIAGNOSIS — K219 Gastro-esophageal reflux disease without esophagitis: Secondary | ICD-10-CM

## 2023-05-14 ENCOUNTER — Other Ambulatory Visit: Payer: Self-pay | Admitting: Nurse Practitioner

## 2023-05-14 DIAGNOSIS — E1165 Type 2 diabetes mellitus with hyperglycemia: Secondary | ICD-10-CM

## 2023-05-16 DIAGNOSIS — H6123 Impacted cerumen, bilateral: Secondary | ICD-10-CM | POA: Diagnosis not present

## 2023-05-16 DIAGNOSIS — N390 Urinary tract infection, site not specified: Secondary | ICD-10-CM | POA: Diagnosis not present

## 2023-05-16 DIAGNOSIS — R300 Dysuria: Secondary | ICD-10-CM | POA: Diagnosis not present

## 2023-05-16 DIAGNOSIS — B372 Candidiasis of skin and nail: Secondary | ICD-10-CM | POA: Diagnosis not present

## 2023-05-22 ENCOUNTER — Ambulatory Visit (HOSPITAL_COMMUNITY)
Admission: RE | Admit: 2023-05-22 | Discharge: 2023-05-22 | Disposition: A | Discharge status: Home / Self Care | Payer: PPO | Source: Ambulatory Visit | Attending: Urology | Admitting: Urology

## 2023-05-22 DIAGNOSIS — G35 Multiple sclerosis: Secondary | ICD-10-CM | POA: Diagnosis not present

## 2023-05-22 DIAGNOSIS — R339 Retention of urine, unspecified: Secondary | ICD-10-CM | POA: Diagnosis not present

## 2023-05-22 DIAGNOSIS — N390 Urinary tract infection, site not specified: Secondary | ICD-10-CM | POA: Insufficient documentation

## 2023-05-22 DIAGNOSIS — N1330 Unspecified hydronephrosis: Secondary | ICD-10-CM | POA: Diagnosis not present

## 2023-05-28 ENCOUNTER — Ambulatory Visit: Payer: PPO | Admitting: Urology

## 2023-05-28 VITALS — BP 113/74 | HR 87

## 2023-05-28 DIAGNOSIS — N401 Enlarged prostate with lower urinary tract symptoms: Secondary | ICD-10-CM | POA: Diagnosis not present

## 2023-05-28 DIAGNOSIS — N200 Calculus of kidney: Secondary | ICD-10-CM

## 2023-05-28 DIAGNOSIS — N138 Other obstructive and reflux uropathy: Secondary | ICD-10-CM | POA: Diagnosis not present

## 2023-05-28 DIAGNOSIS — Z87440 Personal history of urinary (tract) infections: Secondary | ICD-10-CM

## 2023-05-28 DIAGNOSIS — N1339 Other hydronephrosis: Secondary | ICD-10-CM

## 2023-05-28 DIAGNOSIS — R93421 Abnormal radiologic findings on diagnostic imaging of right kidney: Secondary | ICD-10-CM

## 2023-05-28 DIAGNOSIS — R351 Nocturia: Secondary | ICD-10-CM

## 2023-05-28 DIAGNOSIS — N390 Urinary tract infection, site not specified: Secondary | ICD-10-CM

## 2023-05-28 NOTE — Progress Notes (Unsigned)
post void residual =[id number]

## 2023-05-28 NOTE — Progress Notes (Unsigned)
05/28/2023 1:42 PM   Jacob Fowler [birth year] [id number]  Referring provider: Bennie Pierini, FNP [address] [place],  [place] [address]  No chief complaint on file.   HPI:    PMH: Past Medical History:  Diagnosis Date   Acquired leg length discrepancy    Bladder stone    BPH associated with nocturia    CAD (coronary artery disease) CARDIOLOGIST- DR HOCHREIN--  LAST VISIT 09-27-2011 IN EPIC   Non obstructive Cath 2010-  HX CORONARY SPASM 2004   Cataract    bilateral    GERD (gastroesophageal reflux disease)    H/O hiatal hernia    Heart murmur    Heartburn    High cholesterol    History of nephrolithiasis 2009   Hx of adenomatous colonic polyps    Hyperlipidemia    Hypertension    Movement disorder    Multiple sclerosis (HCC) DX  10/10---  NEUROLOGIST  DR Gerlene Burdock [facility] ([place])   RIGHT SIDE AFFECTED MORE W/ WEAKNESS- - USES CANE   Nephrolithiasis 08/30/2020   Neuromuscular disorder (HCC)    MS   NIDDM (non-insulin dependent diabetes mellitus)    Peyronie's disease    RBBB    Renal stone RIGHT   Rosacea    Vision abnormalities     Surgical History: Past Surgical History:  Procedure Laterality Date   CARDIAC CATHETERIZATION  02-26-2009 ---  DR Julien Nordmann   NONOBSTRUCTIVE CAD/ 50% DISTAL RCA/ 30% LAD / NORMAL LVF   CYSTOSCOPY W/ RETROGRADES  10/30/2011   Procedure: CYSTOSCOPY WITH RETROGRADE PYELOGRAM;  Surgeon: Garnett Farm, MD;  Location: [facility];  Service: Urology;  Laterality: Right;   CYSTOSCOPY WITH LITHOLAPAXY  10/30/2011   Procedure: CYSTOSCOPY WITH LITHOLAPAXY;  Surgeon: Garnett Farm, MD;  Location: [facility];  Service: Urology;  Laterality: N/A;   CYSTOSCOPY WITH RETROGRADE PYELOGRAM, URETEROSCOPY AND STENT PLACEMENT Left 11/25/2020   Procedure: CYSTOSCOPY WITH LEFT RETROGRADE PYELOGRAM, LEFT URETEROSCOPY WITH LASER AND LEFT URETERAL STENT PLACEMENT;  Surgeon: Malen Gauze,  MD;  Location: [facility];  Service: Urology;  Laterality: Left;   EXTRACORPOREAL SHOCK WAVE LITHOTRIPSY  09/05/2010   RIGHT   EYE SURGERY Right 08/30/2018   HOLMIUM LASER APPLICATION Left 11/25/2020   Procedure: HOLMIUM LASER APPLICATION;  Surgeon: Malen Gauze, MD;  Location: [facility];  Service: Urology;  Laterality: Left;   NESBIT PROCEDURE  10/17/2004   CORRECTION OF PENILE ANGULATION (PEYRONIES DISEASE)   UTI  06/17/2022    Home Medications:  Allergies as of 05/28/2023       Reactions   Farxiga [dapagliflozin] Other (See Comments)   unable to take Marcelline Deist due to recurrent UTIs, urinary retention and hydronephrosis   Kerendia [finerenone] Other (See Comments)   hyperkalemia        Medication List        Accurate as of May 28, 2023  1:42 PM. If you have any questions, ask your nurse or doctor.          aspirin EC 81 MG tablet Take 81 mg by mouth daily.   Calmoseptine 0.44-20.6 % Oint Generic drug: Menthol-Zinc Oxide Apply small amount o affected are BID   cyanocobalamin 1000 MCG/ML injection Commonly known as: VITAMIN B12 INJECT INTO THE SKIN ONCE A MONTH   ferrous sulfate 325 (65 FE) MG tablet Take 1 tablet (325 mg total) by mouth daily with breakfast.   finasteride 5 MG tablet Commonly known as: PROSCAR  Take 1 tablet (5 mg total) by mouth daily.   fluconazole 150 MG tablet Commonly known as: DIFLUCAN Take 150 mg by mouth once a week.   glucose blood test strip Use as instructed   Insulin Pen Needle 31G X 5 MM Misc Use with lantus pen as directed   isosorbide mononitrate 60 MG 24 hr tablet Commonly known as: IMDUR TAKE 1/2 TABLET (30MG  TOTAL) BY MOUTH DAILY   ketoconazole 2 % cream Commonly known as: NIZORAL Apply 1 Application topically 2 (two) times daily.   Lantus SoloStar 100 UNIT/ML Solostar Pen Generic drug: insulin glargine Inject 55 Units into the skin daily.   levothyroxine 88 MCG tablet Commonly known as:  SYNTHROID Take 1 tablet (88 mcg total) by mouth daily.   magnesium oxide 400 MG tablet Commonly known as: MAG-OX Take 1 tablet by mouth daily.   mirabegron ER 25 MG Tb24 tablet Commonly known as: MYRBETRIQ Take 1 tablet (25 mg total) by mouth daily.   olmesartan 5 MG tablet Commonly known as: BENICAR Take 1 tablet (5 mg total) by mouth daily.   omeprazole 20 MG capsule Commonly known as: PRILOSEC TAKE ONE CAPSULE (20MG  TOTAL) BY MOUTH DAILY   Ozempic (0.25 or 0.5 MG/DOSE) 2 MG/3ML Sopn Generic drug: Semaglutide(0.25 or 0.5MG /DOS) Inject 0.25mg  into skin weekly for 4 weeks, then increase to 0.5mg  weekly thereafter. DX:E11.65   rosuvastatin 10 MG tablet Commonly known as: CRESTOR Take 1 tablet (10 mg total) by mouth daily.   silodosin 8 MG Caps capsule Commonly known as: RAPAFLO Take 1 capsule (8 mg total) by mouth at bedtime.   sodium bicarbonate 650 MG tablet Take 650 mg by mouth 4 (four) times daily.   sodium citrate-citric acid 500-334 MG/5ML solution Commonly known as: ORACIT Take 15 mLs by mouth 2 (two) times daily.   VITAMIN C PO Take by mouth.   Vitamin D 50 MCG (2000 UT) tablet Take 2,000 Units by mouth daily.        Allergies:  Allergies  Allergen Reactions   Farxiga [Dapagliflozin] Other (See Comments)    unable to take Farxiga due to recurrent UTIs, urinary retention and hydronephrosis    Chauncey Mann [Finerenone] Other (See Comments)    hyperkalemia    Family History: Family History  Problem Relation Age of Onset   Stroke Mother    Heart attack Mother    Diabetes Mother    Hypertension Mother    Renal cancer Father    Diabetes Sister    Heart attack Brother    Diabetes Son    Heart attack Maternal Grandfather    Heart attack Brother    Bladder Cancer Brother     Social History:  reports that he quit smoking about 49 years ago. His smoking use included cigarettes. He started smoking about 56 years ago. He has a 7 pack-year smoking  history. His smokeless tobacco use includes chew. He reports that he does not drink alcohol and does not use drugs.  ROS: All other review of systems were reviewed and are negative except what is noted above in HPI  Physical Exam: BP 113/74   Pulse 87   Constitutional:  Alert and oriented, No acute distress. HEENT: [place] AT, moist mucus membranes.  Trachea midline, no masses. Cardiovascular: No clubbing, cyanosis, or edema. Respiratory: Normal respiratory effort, no increased work of breathing. GI: Abdomen is soft, nontender, nondistended, no abdominal masses GU: No CVA tenderness.  Lymph: No cervical or inguinal lymphadenopathy. Skin: No rashes, bruises or  suspicious lesions. Neurologic: Grossly intact, no focal deficits, moving all 4 extremities. Psychiatric: Normal mood and affect.  Laboratory Data: Lab Results  Component Value Date   WBC 15.0 (H) 04/16/2023   HGB 11.2 (L) 04/16/2023   HCT 35.9 (L) 04/16/2023   MCV 87 04/16/2023   PLT 333 04/16/2023    Lab Results  Component Value Date   CREATININE 2.21 (H) 04/16/2023    Lab Results  Component Value Date   PSA 5.5 (H) 01/28/2014   PSA 3.37 01/06/2013   PSA 3.08 10/14/2012    No results found for: "TESTOSTERONE"  Lab Results  Component Value Date   HGBA1C 8.6 (H) 04/16/2023    Urinalysis    Component Value Date/Time   COLORURINE YELLOW 06/17/2022 1140   APPEARANCEUR Cloudy (A) 03/14/2023 1428   LABSPEC 1.013 06/17/2022 1140   PHURINE 7.0 06/17/2022 1140   GLUCOSEU 2+ (A) 03/14/2023 1428   HGBUR SMALL (A) 06/17/2022 1140   BILIRUBINUR Negative 03/14/2023 1428   KETONESUR NEGATIVE 06/17/2022 1140   PROTEINUR 2+ (A) 03/14/2023 1428   PROTEINUR 100 (A) 06/17/2022 1140   UROBILINOGEN negative 12/18/2014 1601   UROBILINOGEN 1.0 04/06/2010 0509   NITRITE Negative 03/14/2023 1428   NITRITE NEGATIVE 06/17/2022 1140   LEUKOCYTESUR 3+ (A) 03/14/2023 1428   LEUKOCYTESUR LARGE (A) 06/17/2022 1140    Lab Results   Component Value Date   LABMICR See below: 03/14/2023   WBCUA >30 (A) 03/14/2023   RBCUA >30 (A) 08/07/2018   LABEPIT 0-10 03/14/2023   MUCUS Present (A) 10/25/2022   BACTERIA Moderate (A) 03/14/2023    Pertinent Imaging: *** Results for orders placed during the hospital encounter of 10/30/11  KUB day of procedure  Narrative *RADIOLOGY REPORT*  Clinical Data: Right ureteral stone  ABDOMEN - 1 VIEW  Comparison: [facility] CT urogram dated 10/17/2011  Findings: Prior distal right ureteral calculus is not definitely visualized on the current radiograph.  Known nonobstructing right lower pole calculi are also not radiographically apparent.  Calcified phleboliths in the pelvis.  Linear calcification overlying the right sacrum corresponds to a vascular calcification.  Suspected radiopaque ingested tablets in the splenic flexure, sigmoid colon (overlying superior bladder), and cecum.  Degenerative changes of the visualized thoracolumbar spine.  IMPRESSION: Prior distal right ureteral calculus is not definitely visualized on the current radiograph.  Known nonobstructing right lower pole calculi are also not radiographically apparent.  Original Report Authenticated By: Charline Bills, M.D.  No results found for this or any previous visit.  No results found for this or any previous visit.  No results found for this or any previous visit.  Results for orders placed during the hospital encounter of 02/01/22  US RENAL  Narrative CLINICAL DATA:  Kidney stone.  EXAM: RENAL / URINARY TRACT ULTRASOUND COMPLETE  COMPARISON:  Renal ultrasound last month 12/16/2021, CT 01/13/2021  FINDINGS: Right Kidney:  Renal measurements: 9.9 x 5.4 x 5.9 cm = volume: 163 mL. Moderate hydronephrosis which was not seen on prior ultrasound. Possible 8 mm nonobstructing stone in the lower kidney, also not seen on prior ultrasound. No evidence of focal renal lesion.  Left  Kidney:  Renal measurements: 9.8 x 4.9 x 5.7 cm = volume: 142 mL. Similar degree of mild to moderate hydronephrosis. No visualized renal stone or focal lesion.  Bladder:  Appears normal for degree of bladder distention. Both ureteral jets are demonstrated.  Other:  None.  IMPRESSION: 1. Moderate right hydronephrosis, new from ultrasound last month. Possible nonobstructing  right renal calculus. 2. Similar degree of mild to moderate left hydronephrosis.   Electronically Signed By: Narda Rutherford M.D. On: 02/02/2022 00:18  No valid procedures specified. No results found for this or any previous visit.  Results for orders placed in visit on 12/25/22  CT RENAL STONE STUDY  Narrative CLINICAL DATA:  Benign prostatic hypertrophy, recurrent UTIs.  EXAM: CT ABDOMEN AND PELVIS WITHOUT CONTRAST  TECHNIQUE: Multidetector CT imaging of the abdomen and pelvis was performed following the standard protocol without IV contrast.  RADIATION DOSE REDUCTION: This exam was performed according to the departmental dose-optimization program which includes automated exposure control, adjustment of the mA and/or kV according to patient size and/or use of iterative reconstruction technique.  COMPARISON:  05/01/2022 and 01/13/2021.  FINDINGS: Lower chest: 3 mm posterior left lower lobe nodule (4/26), unchanged from 01/13/2021. Per Fleischner Society guidelines, no follow-up is necessary. Mild bibasilar subsegmental volume loss. Atherosclerotic calcification of the aorta, aortic valve and coronary arteries. Heart size normal. No pericardial or pleural effusion. Distal esophagus is grossly unremarkable. Small hiatal hernia.  Hepatobiliary: Liver and gallbladder are unremarkable. No biliary ductal dilatation.  Pancreas: Negative.  Spleen: Negative.  Adrenals/Urinary Tract: Adrenal glands are unremarkable. No urinary stones. Moderate bilateral hydronephrosis to the level of  the bladder. No cause readily identified. Bladder is moderately distended.  Stomach/Bowel: Small hiatal hernia. Stomach is relatively decompressed. Small-bowel and appendix are grossly unremarkable. Stool is seen throughout the colon, indicative of constipation.  Vascular/Lymphatic: Atherosclerotic calcification of the aorta. No pathologically enlarged lymph nodes.  Reproductive: Prostate is normal in size.  Other: Small inguinal hernias contain fat. No free fluid. Mesenteries and peritoneum are unremarkable.  Musculoskeletal: Degenerative changes in the spine. Levoconvex scoliosis.  IMPRESSION: 1. Moderate bilateral hydronephrosis, unchanged, without definitive cause identified. Bladder is moderately distended. No urinary stones. 2. Aortic atherosclerosis (ICD10-I70.0). Coronary artery calcification.   Electronically Signed By: Leanna Battles M.D. On: 01/01/2023 13:03   Assessment & Plan:    1. Nephrolithiasis ***  2. Recurrent UTI Urine for culture  3. BPH with obstruction/lower urinary tract symptoms ***  4. Nocturia ***   No follow-ups on file.  Wilkie Aye, MD  [facility]

## 2023-05-28 NOTE — Progress Notes (Cosign Needed Addendum)
Simple Catheter Placement  Due to urinary retention patient is present today for a foley cath placement.  Patient was cleaned and prepped in a sterile fashion with betadine and 2% lidocaine jelly was instilled into the urethra. A 16 FR coude foley catheter was inserted, urine return was noted  , urine was yellow in color.  The balloon was filled with 10cc of sterile water.  A bed bag was attached for drainage. Patient was also given a night bag to take home and was given instruction on how to change from one bag to another.  Patient was given instruction on proper catheter care.  Patient tolerated well, no complications were noted   Performed by: Guss Bunde, CMA  Additional notes/ Follow up: No follow-ups on file.

## 2023-05-29 ENCOUNTER — Encounter: Payer: Self-pay | Admitting: Urology

## 2023-05-29 NOTE — Patient Instructions (Signed)
Hydronephrosis  Hydronephrosis is the swelling of one or both kidneys due to a blockage that stops urine from flowing out of the body. Kidneys filter waste from the blood and produce urine. This condition can lead to kidney failure and may become life-threatening if not treated promptly. What are the causes? In infants and children, common causes include problems that occur when a baby is developing in the womb. These can include problems in the kidneys or in the tubes that drain urine into the bladder (ureters). In adults, common causes include: Kidney stones. Pregnancy. A tumor or cyst in the abdomen or pelvis. An enlarged prostate gland. Other causes include: Bladder infection. Scar tissue from a previous surgery or injury. A blood clot. Cancer of the prostate, bladder, uterus, ovary, or colon. What are the signs or symptoms? Symptoms of this condition include: Pain or discomfort in your side (flank) or abdomen. Swelling in your abdomen. Nausea and vomiting. Fever. Pain when passing urine. Feelings of urgency when you need to urinate. Urinating more often than normal. In some cases, you may not have any symptoms. How is this diagnosed? This condition may be diagnosed based on: Your symptoms and medical history. A physical exam. Blood and urine tests. Imaging tests, such as an ultrasound, CT scan, or MRI. A procedure to look at your urinary tract and bladder by inserting a scope into the urethra (cystoscopy). How is this treated? Treatment for this condition depends on where the blockage is, how long it has been there, and what caused it. The goal of treatment is to remove the blockage. Treatment may include: Antibiotic medicines to treat or prevent infection. A procedure to place a small, thin tube (stent) into a blocked ureter. The stent will keep the ureter open so that urine can drain through it. A nonsurgical procedure that crushes kidney stones with shock waves  (extracorporeal shock wave lithotripsy). If kidney failure occurs, treatment may include dialysis or a kidney transplant. Follow these instructions at home:  Take over-the-counter and prescription medicines only as told by your health care provider. If you were prescribed an antibiotic medicine, take it exactly as told by your health care provider. Do not stop taking the antibiotic even if you start to feel better. Rest and return to your normal activities as told by your health care provider. Ask your health care provider what activities are safe for you. Drink enough fluid to keep your urine pale yellow. Keep all follow-up visits. This is important. Contact a health care provider if: You continue to have symptoms after treatment. You develop new symptoms. Your urine becomes cloudy or bloody. You have a fever. Get help right away if: You have severe flank or abdominal pain. You cannot drink fluids without vomiting. Summary Hydronephrosis is the swelling of one or both kidneys due to a blockage that stops urine from flowing out of the body. Hydronephrosis can lead to kidney failure and may become life-threatening if not treated promptly. The goal of treatment is to remove the blockage. It may include a procedure to insert a stent into a blocked ureter, a procedure to break up kidney stones, or taking antibiotic medicines. Follow your health care provider's instructions for taking care of yourself at home, including instructions about drinking fluids, taking medicines, and limiting activities. This information is not intended to replace advice given to you by your health care provider. Make sure you discuss any questions you have with your health care provider. Document Revised: 10/21/2019 Document Reviewed: 10/21/2019 Elsevier   Patient Education  2024 [employer]  

## 2023-06-04 ENCOUNTER — Ambulatory Visit (HOSPITAL_COMMUNITY)
Admission: RE | Admit: 2023-06-04 | Discharge: 2023-06-04 | Disposition: A | Discharge status: Home / Self Care | Payer: PPO | Source: Ambulatory Visit | Attending: Urology | Admitting: Urology

## 2023-06-04 DIAGNOSIS — N1832 Chronic kidney disease, stage 3b: Secondary | ICD-10-CM | POA: Diagnosis not present

## 2023-06-04 DIAGNOSIS — E1129 Type 2 diabetes mellitus with other diabetic kidney complication: Secondary | ICD-10-CM | POA: Diagnosis not present

## 2023-06-04 DIAGNOSIS — R809 Proteinuria, unspecified: Secondary | ICD-10-CM | POA: Diagnosis not present

## 2023-06-04 DIAGNOSIS — E1122 Type 2 diabetes mellitus with diabetic chronic kidney disease: Secondary | ICD-10-CM | POA: Diagnosis not present

## 2023-06-04 DIAGNOSIS — N1339 Other hydronephrosis: Secondary | ICD-10-CM | POA: Diagnosis not present

## 2023-06-04 DIAGNOSIS — R808 Other proteinuria: Secondary | ICD-10-CM | POA: Diagnosis not present

## 2023-06-04 DIAGNOSIS — N189 Chronic kidney disease, unspecified: Secondary | ICD-10-CM | POA: Diagnosis not present

## 2023-06-04 DIAGNOSIS — R935 Abnormal findings on diagnostic imaging of other abdominal regions, including retroperitoneum: Secondary | ICD-10-CM | POA: Diagnosis not present

## 2023-06-04 DIAGNOSIS — N1330 Unspecified hydronephrosis: Secondary | ICD-10-CM | POA: Diagnosis not present

## 2023-06-11 DIAGNOSIS — E871 Hypo-osmolality and hyponatremia: Secondary | ICD-10-CM | POA: Diagnosis not present

## 2023-06-11 DIAGNOSIS — E1122 Type 2 diabetes mellitus with diabetic chronic kidney disease: Secondary | ICD-10-CM | POA: Diagnosis not present

## 2023-06-11 DIAGNOSIS — E1129 Type 2 diabetes mellitus with other diabetic kidney complication: Secondary | ICD-10-CM | POA: Diagnosis not present

## 2023-06-11 DIAGNOSIS — R809 Proteinuria, unspecified: Secondary | ICD-10-CM | POA: Diagnosis not present

## 2023-06-26 ENCOUNTER — Emergency Department (HOSPITAL_COMMUNITY): Payer: PPO

## 2023-06-26 ENCOUNTER — Encounter (HOSPITAL_COMMUNITY): Payer: Self-pay | Admitting: *Deleted

## 2023-06-26 ENCOUNTER — Inpatient Hospital Stay (HOSPITAL_COMMUNITY)
Admission: EM | Admit: 2023-06-26 | Discharge: 2023-06-29 | DRG: 871 | Disposition: A | Discharge status: Home Health | Payer: PPO | Attending: Family Medicine | Admitting: Family Medicine

## 2023-06-26 ENCOUNTER — Encounter: Payer: Self-pay | Admitting: Nurse Practitioner

## 2023-06-26 ENCOUNTER — Ambulatory Visit: Payer: PPO | Admitting: Urology

## 2023-06-26 ENCOUNTER — Other Ambulatory Visit: Payer: Self-pay

## 2023-06-26 DIAGNOSIS — E1165 Type 2 diabetes mellitus with hyperglycemia: Secondary | ICD-10-CM | POA: Diagnosis not present

## 2023-06-26 DIAGNOSIS — N401 Enlarged prostate with lower urinary tract symptoms: Secondary | ICD-10-CM | POA: Diagnosis not present

## 2023-06-26 DIAGNOSIS — Z87891 Personal history of nicotine dependence: Secondary | ICD-10-CM

## 2023-06-26 DIAGNOSIS — R351 Nocturia: Secondary | ICD-10-CM | POA: Diagnosis present

## 2023-06-26 DIAGNOSIS — K8689 Other specified diseases of pancreas: Secondary | ICD-10-CM | POA: Diagnosis present

## 2023-06-26 DIAGNOSIS — Z888 Allergy status to other drugs, medicaments and biological substances status: Secondary | ICD-10-CM

## 2023-06-26 DIAGNOSIS — E1169 Type 2 diabetes mellitus with other specified complication: Secondary | ICD-10-CM | POA: Diagnosis present

## 2023-06-26 DIAGNOSIS — B965 Pseudomonas (aeruginosa) (mallei) (pseudomallei) as the cause of diseases classified elsewhere: Secondary | ICD-10-CM | POA: Diagnosis present

## 2023-06-26 DIAGNOSIS — E785 Hyperlipidemia, unspecified: Secondary | ICD-10-CM | POA: Diagnosis not present

## 2023-06-26 DIAGNOSIS — R109 Unspecified abdominal pain: Secondary | ICD-10-CM | POA: Diagnosis not present

## 2023-06-26 DIAGNOSIS — Z794 Long term (current) use of insulin: Secondary | ICD-10-CM | POA: Diagnosis not present

## 2023-06-26 DIAGNOSIS — I152 Hypertension secondary to endocrine disorders: Secondary | ICD-10-CM | POA: Diagnosis not present

## 2023-06-26 DIAGNOSIS — Z1152 Encounter for screening for COVID-19: Secondary | ICD-10-CM

## 2023-06-26 DIAGNOSIS — L89312 Pressure ulcer of right buttock, stage 2: Secondary | ICD-10-CM | POA: Diagnosis not present

## 2023-06-26 DIAGNOSIS — G9341 Metabolic encephalopathy: Secondary | ICD-10-CM | POA: Diagnosis not present

## 2023-06-26 DIAGNOSIS — Z8052 Family history of malignant neoplasm of bladder: Secondary | ICD-10-CM

## 2023-06-26 DIAGNOSIS — I2510 Atherosclerotic heart disease of native coronary artery without angina pectoris: Secondary | ICD-10-CM | POA: Diagnosis present

## 2023-06-26 DIAGNOSIS — N390 Urinary tract infection, site not specified: Secondary | ICD-10-CM | POA: Diagnosis not present

## 2023-06-26 DIAGNOSIS — K449 Diaphragmatic hernia without obstruction or gangrene: Secondary | ICD-10-CM | POA: Diagnosis not present

## 2023-06-26 DIAGNOSIS — G35 Multiple sclerosis: Secondary | ICD-10-CM | POA: Diagnosis present

## 2023-06-26 DIAGNOSIS — Z993 Dependence on wheelchair: Secondary | ICD-10-CM

## 2023-06-26 DIAGNOSIS — A419 Sepsis, unspecified organism: Principal | ICD-10-CM | POA: Diagnosis present

## 2023-06-26 DIAGNOSIS — R5383 Other fatigue: Secondary | ICD-10-CM | POA: Diagnosis not present

## 2023-06-26 DIAGNOSIS — Z8051 Family history of malignant neoplasm of kidney: Secondary | ICD-10-CM

## 2023-06-26 DIAGNOSIS — R220 Localized swelling, mass and lump, head: Secondary | ICD-10-CM | POA: Diagnosis not present

## 2023-06-26 DIAGNOSIS — M799 Soft tissue disorder, unspecified: Secondary | ICD-10-CM | POA: Diagnosis not present

## 2023-06-26 DIAGNOSIS — K219 Gastro-esophageal reflux disease without esophagitis: Secondary | ICD-10-CM | POA: Diagnosis not present

## 2023-06-26 DIAGNOSIS — N453 Epididymo-orchitis: Secondary | ICD-10-CM | POA: Diagnosis not present

## 2023-06-26 DIAGNOSIS — T83511A Infection and inflammatory reaction due to indwelling urethral catheter, initial encounter: Secondary | ICD-10-CM | POA: Diagnosis not present

## 2023-06-26 DIAGNOSIS — Z7982 Long term (current) use of aspirin: Secondary | ICD-10-CM

## 2023-06-26 DIAGNOSIS — E871 Hypo-osmolality and hyponatremia: Secondary | ICD-10-CM | POA: Diagnosis not present

## 2023-06-26 DIAGNOSIS — Z833 Family history of diabetes mellitus: Secondary | ICD-10-CM

## 2023-06-26 DIAGNOSIS — R6520 Severe sepsis without septic shock: Secondary | ICD-10-CM | POA: Diagnosis not present

## 2023-06-26 DIAGNOSIS — E1159 Type 2 diabetes mellitus with other circulatory complications: Secondary | ICD-10-CM | POA: Diagnosis not present

## 2023-06-26 DIAGNOSIS — Z79890 Hormone replacement therapy (postmenopausal): Secondary | ICD-10-CM

## 2023-06-26 DIAGNOSIS — R0989 Other specified symptoms and signs involving the circulatory and respiratory systems: Secondary | ICD-10-CM | POA: Diagnosis not present

## 2023-06-26 DIAGNOSIS — Z978 Presence of other specified devices: Secondary | ICD-10-CM

## 2023-06-26 DIAGNOSIS — Z823 Family history of stroke: Secondary | ICD-10-CM

## 2023-06-26 DIAGNOSIS — R4182 Altered mental status, unspecified: Secondary | ICD-10-CM | POA: Diagnosis not present

## 2023-06-26 DIAGNOSIS — Z8249 Family history of ischemic heart disease and other diseases of the circulatory system: Secondary | ICD-10-CM

## 2023-06-26 DIAGNOSIS — I700 Atherosclerosis of aorta: Secondary | ICD-10-CM | POA: Diagnosis not present

## 2023-06-26 DIAGNOSIS — L89150 Pressure ulcer of sacral region, unstageable: Secondary | ICD-10-CM | POA: Diagnosis present

## 2023-06-26 DIAGNOSIS — N433 Hydrocele, unspecified: Secondary | ICD-10-CM | POA: Diagnosis not present

## 2023-06-26 DIAGNOSIS — E7800 Pure hypercholesterolemia, unspecified: Secondary | ICD-10-CM | POA: Diagnosis not present

## 2023-06-26 DIAGNOSIS — Y846 Urinary catheterization as the cause of abnormal reaction of the patient, or of later complication, without mention of misadventure at the time of the procedure: Secondary | ICD-10-CM | POA: Diagnosis present

## 2023-06-26 DIAGNOSIS — Z79899 Other long term (current) drug therapy: Secondary | ICD-10-CM

## 2023-06-26 DIAGNOSIS — E039 Hypothyroidism, unspecified: Secondary | ICD-10-CM | POA: Diagnosis not present

## 2023-06-26 DIAGNOSIS — N138 Other obstructive and reflux uropathy: Secondary | ICD-10-CM | POA: Diagnosis present

## 2023-06-26 DIAGNOSIS — Z87442 Personal history of urinary calculi: Secondary | ICD-10-CM

## 2023-06-26 DIAGNOSIS — K869 Disease of pancreas, unspecified: Secondary | ICD-10-CM | POA: Diagnosis present

## 2023-06-26 DIAGNOSIS — M21371 Foot drop, right foot: Secondary | ICD-10-CM | POA: Diagnosis present

## 2023-06-26 DIAGNOSIS — L89320 Pressure ulcer of left buttock, unstageable: Secondary | ICD-10-CM | POA: Diagnosis not present

## 2023-06-26 DIAGNOSIS — Z860101 Personal history of adenomatous and serrated colon polyps: Secondary | ICD-10-CM

## 2023-06-26 DIAGNOSIS — B9689 Other specified bacterial agents as the cause of diseases classified elsewhere: Secondary | ICD-10-CM | POA: Diagnosis present

## 2023-06-26 DIAGNOSIS — N5089 Other specified disorders of the male genital organs: Secondary | ICD-10-CM | POA: Diagnosis not present

## 2023-06-26 DIAGNOSIS — Z7984 Long term (current) use of oral hypoglycemic drugs: Secondary | ICD-10-CM

## 2023-06-26 DIAGNOSIS — I959 Hypotension, unspecified: Secondary | ICD-10-CM | POA: Diagnosis present

## 2023-06-26 DIAGNOSIS — R000 Tachycardia, unspecified: Secondary | ICD-10-CM | POA: Diagnosis not present

## 2023-06-26 LAB — CBC WITH DIFFERENTIAL/PLATELET
Abs Immature Granulocytes: 0.18 10*3/uL — ABNORMAL HIGH (ref 0.00–0.07)
Basophils Absolute: 0.1 10*3/uL (ref 0.0–0.1)
Basophils Relative: 0 %
Eosinophils Absolute: 0.1 10*3/uL (ref 0.0–0.5)
Eosinophils Relative: 0 %
HCT: 35.6 % — ABNORMAL LOW (ref 39.0–52.0)
Hemoglobin: 11.4 g/dL — ABNORMAL LOW (ref 13.0–17.0)
Immature Granulocytes: 1 %
Lymphocytes Relative: 4 %
Lymphs Abs: 1 10*3/uL (ref 0.7–4.0)
MCH: 26.6 pg (ref 26.0–34.0)
MCHC: 32 g/dL (ref 30.0–36.0)
MCV: 83.2 fL (ref 80.0–100.0)
Monocytes Absolute: 2.5 10*3/uL — ABNORMAL HIGH (ref 0.1–1.0)
Monocytes Relative: 10 %
Neutro Abs: 21.7 10*3/uL — ABNORMAL HIGH (ref 1.7–7.7)
Neutrophils Relative %: 85 %
Platelets: 239 10*3/uL (ref 150–400)
RBC: 4.28 MIL/uL (ref 4.22–5.81)
RDW: 15.9 % — ABNORMAL HIGH (ref 11.5–15.5)
WBC: 25.4 10*3/uL — ABNORMAL HIGH (ref 4.0–10.5)
nRBC: 0 % (ref 0.0–0.2)

## 2023-06-26 LAB — URINALYSIS, W/ REFLEX TO CULTURE (INFECTION SUSPECTED)
Bacteria, UA: NONE SEEN
Bilirubin Urine: NEGATIVE
Bilirubin Urine: NEGATIVE
Glucose, UA: 50 mg/dL — AB
Glucose, UA: 50 mg/dL — AB
Ketones, ur: NEGATIVE mg/dL
Ketones, ur: NEGATIVE mg/dL
Nitrite: NEGATIVE
Nitrite: NEGATIVE
Protein, ur: 100 mg/dL — AB
Protein, ur: 30 mg/dL — AB
Specific Gravity, Urine: 1.014 (ref 1.005–1.030)
Specific Gravity, Urine: 1.006 (ref 1.005–1.030)
WBC, UA: >50 WBC/hpf (ref 0–5)
WBC, UA: >50 WBC/hpf (ref 0–5)
pH: 5 (ref 5.0–8.0)
pH: 8 (ref 5.0–8.0)

## 2023-06-26 LAB — COMPREHENSIVE METABOLIC PANEL
ALT: 24 U/L (ref 0–44)
AST: 27 U/L (ref 15–41)
Albumin: 3.5 g/dL (ref 3.5–5.0)
Alkaline Phosphatase: 149 U/L — ABNORMAL HIGH (ref 38–126)
Anion gap: 13 (ref 5–15)
BUN: 23 mg/dL (ref 8–23)
CO2: 22 mmol/L (ref 22–32)
Calcium: 9.4 mg/dL (ref 8.9–10.3)
Chloride: 92 mmol/L — ABNORMAL LOW (ref 98–111)
Creatinine, Ser: 1.86 mg/dL — ABNORMAL HIGH (ref 0.61–1.24)
GFR, Estimated: 38 mL/min — ABNORMAL LOW (ref >60)
Glucose, Bld: 208 mg/dL — ABNORMAL HIGH (ref 70–99)
Potassium: 4 mmol/L (ref 3.5–5.1)
Sodium: 127 mmol/L — ABNORMAL LOW (ref 135–145)
Total Bilirubin: 0.8 mg/dL (ref <1.2)
Total Protein: 7.7 g/dL (ref 6.5–8.1)

## 2023-06-26 LAB — BLOOD GAS, VENOUS
Acid-base deficit: 2.3 mmol/L — ABNORMAL HIGH (ref 0.0–2.0)
Bicarbonate: 21.4 mmol/L (ref 20.0–28.0)
Drawn by: 1528
O2 Saturation: 90.7 %
Patient temperature: 37.6
pCO2, Ven: 34 mm[Hg] — ABNORMAL LOW (ref 44–60)
pH, Ven: 7.41 (ref 7.25–7.43)
pO2, Ven: 58 mm[Hg] — ABNORMAL HIGH (ref 32–45)

## 2023-06-26 LAB — APTT: aPTT: 33 s (ref 24–36)

## 2023-06-26 LAB — RESP PANEL BY RT-PCR (RSV, FLU A&B, COVID)  RVPGX2
Influenza A by PCR: NEGATIVE
Influenza B by PCR: NEGATIVE
Resp Syncytial Virus by PCR: NEGATIVE
SARS Coronavirus 2 by RT PCR: NEGATIVE

## 2023-06-26 LAB — AMMONIA: Ammonia: 18 umol/L (ref 9–35)

## 2023-06-26 LAB — CBG MONITORING, ED: Glucose-Capillary: 196 mg/dL — ABNORMAL HIGH (ref 70–99)

## 2023-06-26 LAB — LACTIC ACID, PLASMA
Lactic Acid, Venous: 1.9 mmol/L (ref 0.5–1.9)
Lactic Acid, Venous: 1.7 mmol/L (ref 0.5–1.9)

## 2023-06-26 LAB — MRSA NEXT GEN BY PCR, NASAL: MRSA by PCR Next Gen: NOT DETECTED

## 2023-06-26 LAB — PROTIME-INR
INR: 1 (ref 0.8–1.2)
Prothrombin Time: 13.7 s (ref 11.4–15.2)

## 2023-06-26 LAB — GLUCOSE, CAPILLARY: Glucose-Capillary: 86 mg/dL (ref 70–99)

## 2023-06-26 LAB — BRAIN NATRIURETIC PEPTIDE: B Natriuretic Peptide: 465 pg/mL — ABNORMAL HIGH (ref 0.0–100.0)

## 2023-06-26 MED ORDER — LEVOTHYROXINE SODIUM 88 MCG PO TABS
88.0000 ug | ORAL_TABLET | Freq: Every day | ORAL | Status: DC
  Administered 2023-06-28 – 2023-06-29 (×2): 88 ug via ORAL
  Filled 2023-06-26 (×2): qty 1

## 2023-06-26 MED ORDER — ACETAMINOPHEN 650 MG RE SUPP: 650.0000 mg | Freq: Four times a day (QID) | RECTAL | Status: DC | PRN

## 2023-06-26 MED ORDER — ENOXAPARIN SODIUM 40 MG/0.4ML IJ SOSY
40.0000 mg | PREFILLED_SYRINGE | INTRAMUSCULAR | Status: DC
  Administered 2023-06-27 – 2023-06-29 (×3): 40 mg via SUBCUTANEOUS
  Filled 2023-06-26 (×3): qty 0.4

## 2023-06-26 MED ORDER — ONDANSETRON HCL 4 MG/2ML IJ SOLN: 4.0000 mg | Freq: Four times a day (QID) | INTRAMUSCULAR | Status: DC | PRN

## 2023-06-26 MED ORDER — ASPIRIN 81 MG PO TBEC
81.0000 mg | DELAYED_RELEASE_TABLET | Freq: Every day | ORAL | Status: DC
  Administered 2023-06-27 – 2023-06-29 (×3): 81 mg via ORAL
  Filled 2023-06-26 (×3): qty 1

## 2023-06-26 MED ORDER — POLYETHYLENE GLYCOL 3350 17 G PO PACK: 17.0000 g | PACK | Freq: Every day | ORAL | Status: DC | PRN

## 2023-06-26 MED ORDER — SODIUM CHLORIDE 0.9 % IV SOLN
2.0000 g | Freq: Once | INTRAVENOUS | Status: AC
  Administered 2023-06-26: 2 g via INTRAVENOUS
  Filled 2023-06-26: qty 12.5

## 2023-06-26 MED ORDER — SODIUM CHLORIDE 0.9 % IV BOLUS (SEPSIS)
250.0000 mL | Freq: Once | INTRAVENOUS | Status: AC
  Administered 2023-06-26: 250 mL via INTRAVENOUS

## 2023-06-26 MED ORDER — PANTOPRAZOLE SODIUM 40 MG PO TBEC
40.0000 mg | DELAYED_RELEASE_TABLET | Freq: Every day | ORAL | Status: DC
  Administered 2023-06-27 – 2023-06-29 (×3): 40 mg via ORAL
  Filled 2023-06-26 (×3): qty 1

## 2023-06-26 MED ORDER — ONDANSETRON HCL 4 MG PO TABS: 4.0000 mg | ORAL_TABLET | Freq: Four times a day (QID) | ORAL | Status: DC | PRN

## 2023-06-26 MED ORDER — SODIUM CHLORIDE 0.9 % IV BOLUS (SEPSIS)
1000.0000 mL | Freq: Once | INTRAVENOUS | Status: AC
  Administered 2023-06-26: 1000 mL via INTRAVENOUS

## 2023-06-26 MED ORDER — INSULIN ASPART 100 UNIT/ML IJ SOLN
0.0000 [IU] | INTRAMUSCULAR | Status: DC
  Administered 2023-06-27 (×4): 5 [IU] via SUBCUTANEOUS
  Administered 2023-06-28: 2 [IU] via SUBCUTANEOUS
  Administered 2023-06-28: 5 [IU] via SUBCUTANEOUS
  Administered 2023-06-28 (×2): 11 [IU] via SUBCUTANEOUS
  Administered 2023-06-29: 5 [IU] via SUBCUTANEOUS
  Administered 2023-06-29: 3 [IU] via SUBCUTANEOUS
  Administered 2023-06-29: 8 [IU] via SUBCUTANEOUS
  Administered 2023-06-29: 3 [IU] via SUBCUTANEOUS

## 2023-06-26 MED ORDER — SODIUM CHLORIDE 0.9 % IV SOLN
2.0000 g | Freq: Two times a day (BID) | INTRAVENOUS | Status: DC
  Administered 2023-06-27 – 2023-06-29 (×5): 2 g via INTRAVENOUS
  Filled 2023-06-26 (×5): qty 12.5

## 2023-06-26 MED ORDER — ACETAMINOPHEN 325 MG PO TABS: 650.0000 mg | ORAL_TABLET | Freq: Four times a day (QID) | ORAL | Status: DC | PRN

## 2023-06-26 MED ORDER — IOHEXOL 350 MG/ML SOLN
80.0000 mL | Freq: Once | INTRAVENOUS | Status: AC | PRN
  Administered 2023-06-26: 80 mL via INTRAVENOUS

## 2023-06-26 MED ORDER — FINASTERIDE 5 MG PO TABS
5.0000 mg | ORAL_TABLET | Freq: Every day | ORAL | Status: DC
Start: 2023-06-27 — End: 2023-06-29
  Administered 2023-06-27 – 2023-06-29 (×3): 5 mg via ORAL
  Filled 2023-06-26 (×3): qty 1

## 2023-06-26 MED ORDER — IPRATROPIUM-ALBUTEROL 0.5-2.5 (3) MG/3ML IN SOLN
3.0000 mL | Freq: Once | RESPIRATORY_TRACT | Status: DC
  Filled 2023-06-26: qty 3

## 2023-06-26 MED ORDER — SODIUM CHLORIDE 0.9 % IV SOLN: INTRAVENOUS | Status: AC

## 2023-06-26 NOTE — Assessment & Plan Note (Signed)
Ambulates with wheelchair.  Weakness to right lower extremity, phonic indwelling Foley catheter.  Lives at home with family.

## 2023-06-26 NOTE — ED Notes (Signed)
2 Rn's giving wound assessment for sacrum. MD assessing image and placing in chart

## 2023-06-26 NOTE — H&P (Signed)
History and Physical    Jacob Fowler FIE:[id number] DOB: February 09, 1951 DOA: 06/26/2023  PCP: Bennie Pierini, FNP   Patient coming from: Home  I have personally briefly reviewed patient's old medical records in [facility]  Chief Complaint: AMS  HPI: Jacob Fowler is a 72 y.o. male with medical history significant for multiple sclerosis, BPH, diabetes mellitus, hypertension, hypothyroidism, chronic indwelling Foley catheter. Patient was brought to the ED with complaints of increasing lethargy.  At the time of my evaluation, patient is awake and able to answer simple questions, but is unable to give details.  Daughter and spouse at bedside.  Did notice blood in his Foley catheter, with greenish urine urine in the bag, this was emptied out and subsequently drained clear urine.  Today he was out of it, and lethargic.  Today he also noticed that his scrotum was red, but without pain. He has otherwise had good oral intake, no vomiting no diarrhea, no difficulty breathing or cough.  No abdominal or chest pain.  At baseline he has no memory deficits, he ambulates with a wheelchair due to MS mostly affecting his right lower extremity and bladder.  ED Course: Tmax 99.6.  Respiratory rate 16-23.  Blood pressure systolic 79-110.  O2 sats 80-126.   2.25 L bolus given with improvement in blood pressure. Lactic acid 1.9 > 1.7. Leukocytosis of 25.4. UA suggestive of UTI. CT abdomen pelvis with contrast suggest cystitis, also pancreatic lesion.  Head CT negative for acute abnormality. CTA chest negative for PE. 2 g ceftriaxone given.  Review of Systems: As per HPI all other systems reviewed and negative.  Past Medical History:  Diagnosis Date   Acquired leg length discrepancy    Bladder stone    BPH associated with nocturia    CAD (coronary artery disease) CARDIOLOGIST- DR HOCHREIN--  LAST VISIT 09-27-2011 IN EPIC   Non obstructive Cath 2010-  HX CORONARY SPASM 2004    Cataract    bilateral    GERD (gastroesophageal reflux disease)    H/O hiatal hernia    Heart murmur    Heartburn    High cholesterol    History of nephrolithiasis 2009   Hx of adenomatous colonic polyps    Hyperlipidemia    Hypertension    Movement disorder    Multiple sclerosis (HCC) DX  10/10---  NEUROLOGIST  DR Gerlene Burdock [place] ([place])   RIGHT SIDE AFFECTED MORE W/ WEAKNESS- - USES CANE   Nephrolithiasis 08/30/2020   Neuromuscular disorder (HCC)    MS   NIDDM (non-insulin dependent diabetes mellitus)    Peyronie's disease    RBBB    Renal stone RIGHT   Rosacea    Vision abnormalities     Past Surgical History:  Procedure Laterality Date   CARDIAC CATHETERIZATION  02-26-2009 ---  DR Julien Nordmann   NONOBSTRUCTIVE CAD/ 50% DISTAL RCA/ 30% LAD / NORMAL LVF   CYSTOSCOPY W/ RETROGRADES  10/30/2011   Procedure: CYSTOSCOPY WITH RETROGRADE PYELOGRAM;  Surgeon: Garnett Farm, MD;  Location: [facility];  Service: Urology;  Laterality: Right;   CYSTOSCOPY WITH LITHOLAPAXY  10/30/2011   Procedure: CYSTOSCOPY WITH LITHOLAPAXY;  Surgeon: Garnett Farm, MD;  Location: [facility];  Service: Urology;  Laterality: N/A;   CYSTOSCOPY WITH RETROGRADE PYELOGRAM, URETEROSCOPY AND STENT PLACEMENT Left 11/25/2020   Procedure: CYSTOSCOPY WITH LEFT RETROGRADE PYELOGRAM, LEFT URETEROSCOPY WITH LASER AND LEFT URETERAL STENT PLACEMENT;  Surgeon: Malen Gauze, MD;  Location: [facility];  Service: Urology;  Laterality: Left;   EXTRACORPOREAL SHOCK WAVE LITHOTRIPSY  09/05/2010   RIGHT   EYE SURGERY Right 08/30/2018   HOLMIUM LASER APPLICATION Left 11/25/2020   Procedure: HOLMIUM LASER APPLICATION;  Surgeon: Malen Gauze, MD;  Location: [facility];  Service: Urology;  Laterality: Left;   NESBIT PROCEDURE  10/17/2004   CORRECTION OF PENILE ANGULATION (PEYRONIES DISEASE)   UTI  06/17/2022     reports that he quit smoking about 49 years ago. His smoking use  included cigarettes. He started smoking about 56 years ago. He has a 7 pack-year smoking history. His smokeless tobacco use includes chew. He reports that he does not drink alcohol and does not use drugs.  Allergies  Allergen Reactions   Farxiga [Dapagliflozin] Other (See Comments)    unable to take Farxiga due to recurrent UTIs, urinary retention and hydronephrosis    Chauncey Mann [Finerenone] Other (See Comments)    hyperkalemia    Family History  Problem Relation Age of Onset   Stroke Mother    Heart attack Mother    Diabetes Mother    Hypertension Mother    Renal cancer Father    Diabetes Sister    Heart attack Brother    Diabetes Son    Heart attack Maternal Grandfather    Heart attack Brother    Bladder Cancer Brother     Prior to Admission medications   Medication Sig Start Date End Date Taking? Authorizing Provider  Ascorbic Acid (VITAMIN C PO) Take 500 mg by mouth in the morning and at bedtime.   Yes [provider]  aspirin 81 MG EC tablet Take 81 mg by mouth daily.   Yes [provider]  Cholecalciferol (VITAMIN D) 50 MCG (2000 UT) tablet Take 2,000 Units by mouth daily.   Yes [provider]  cyanocobalamin (VITAMIN B12) 1000 MCG/ML injection INJECT INTO THE SKIN ONCE A MONTH 11/01/22  Yes Daphine Deutscher, Mary-Margaret, FNP  ferrous sulfate 325 (65 FE) MG tablet Take 1 tablet (325 mg total) by mouth daily with breakfast. 01/26/23  Yes Daphine Deutscher, Mary-Margaret, FNP  finasteride (PROSCAR) 5 MG tablet Take 1 tablet (5 mg total) by mouth daily. 01/26/23  Yes Martin, Mary-Margaret, FNP  insulin glargine (LANTUS SOLOSTAR) 100 UNIT/ML Solostar Pen Inject 55 Units into the skin daily. 04/16/23  Yes Daphine Deutscher, Mary-Margaret, FNP  isosorbide mononitrate (IMDUR) 60 MG 24 hr tablet TAKE 1/2 TABLET (30MG  TOTAL) BY MOUTH DAILY 04/16/23  Yes Daphine Deutscher, Mary-Margaret, FNP  levothyroxine (SYNTHROID) 88 MCG tablet Take 1 tablet (88 mcg total) by mouth daily. 04/17/23  Yes Martin,  Mary-Margaret, FNP  magnesium oxide (MAG-OX) 400 MG tablet Take 1 tablet by mouth daily.   Yes [provider]  olmesartan (BENICAR) 5 MG tablet Take 1 tablet (5 mg total) by mouth daily. 04/16/23 04/15/24 Yes Martin, Mary-Margaret, FNP  omeprazole (PRILOSEC) 20 MG capsule TAKE ONE CAPSULE (20MG  TOTAL) BY MOUTH DAILY 05/10/23  Yes Daphine Deutscher, Mary-Margaret, FNP  rosuvastatin (CRESTOR) 10 MG tablet Take 1 tablet (10 mg total) by mouth daily. 04/16/23  Yes Martin, Mary-Margaret, FNP  Semaglutide,0.25 or 0.5MG /DOS, (OZEMPIC, 0.25 OR 0.5 MG/DOSE,) 2 MG/3ML SOPN Inject 0.25mg  into skin weekly for 4 weeks, then increase to 0.5mg  weekly thereafter. DX:E11.65 05/14/23  Yes Martin, Mary-Margaret, FNP  silodosin (RAPAFLO) 8 MG CAPS capsule Take 1 capsule (8 mg total) by mouth at bedtime. 04/16/23  Yes Martin, Mary-Margaret, FNP  sodium bicarbonate 650 MG tablet Take 650 mg by mouth 3 (three)  times daily.   Yes [provider]  sodium citrate-citric acid (ORACIT) 500-334 MG/5ML solution Take 15 mLs by mouth 2 (two) times daily. 02/06/23  Yes [provider]  glucose blood test strip Use as instructed 07/06/14   Ernestina Penna, MD  Insulin Pen Needle 31G X 5 MM MISC Use with lantus pen as directed 04/16/23   Daphine Deutscher, Mary-Margaret, FNP  SPS, SODIUM POLYSTYRENE SULF, 15 GM/60ML suspension Take 15 mLs by mouth in the morning and at bedtime. 06/11/23   [provider]  atorvastatin (LIPITOR) 10 MG tablet Take 10 mg by mouth daily.  09/27/11  [provider]  niacin-simvastatin (SIMCOR) 500-20 MG 24 hr tablet Take 1 tablet by mouth at bedtime.    09/27/11  [provider]    Physical Exam: Vitals:   06/26/23 1600 06/26/23 1759 06/26/23 1830 06/26/23 1858  BP: 126/73  (!) 80/51   Pulse: 90  79   Resp: (!) 23  16   Temp:  98.9 F (37.2 C)  99.6 F (37.6 C)  TempSrc:  Oral  Rectal  SpO2: 95%  94%   Weight:      Height:        Constitutional: NAD, calm,  comfortable Vitals:   06/26/23 1600 06/26/23 1759 06/26/23 1830 06/26/23 1858  BP: 126/73  (!) 80/51   Pulse: 90  79   Resp: (!) 23  16   Temp:  98.9 F (37.2 C)  99.6 F (37.6 C)  TempSrc:  Oral  Rectal  SpO2: 95%  94%   Weight:      Height:       Eyes: PERRL, lids and conjunctivae normal ENMT: Mucous membranes are dry Neck: normal, supple, no masses, no thyromegaly Respiratory: clear to auscultation bilaterally, no wheezing, no crackles. Normal respiratory effort. No accessory muscle use.  Cardiovascular: Regular rate and rhythm, no murmurs / rubs / gallops. No extremity edema.  Extremities warm. Abdomen: no tenderness, no masses palpated. No hepatosplenomegaly. Bowel sounds positive.  Musculoskeletal: no clubbing / cyanosis.  Skin: Groin examined in the presence of chaperone ED nurse tech,- scrotal skin appears red, mild palpable tenderness to the right scrotum, appears more firm compared to the left.   Neurologic: Generalized weakness, but able to move upper extremities and left lower extremity.  Unable to move right lower extremity. Psychiatric: Normal judgment and insight. Alert and oriented x 3. Normal mood.   Labs on Admission: I have personally reviewed following labs and imaging studies  CBC: Recent Labs  Lab 06/26/23 1403  WBC 25.4*  NEUTROABS 21.7*  HGB 11.4*  HCT 35.6*  MCV 83.2  PLT 239   Basic Metabolic Panel: Recent Labs  Lab 06/26/23 1403  NA 127*  K 4.0  CL 92*  CO2 22  GLUCOSE 208*  BUN 23  CREATININE 1.86*  CALCIUM 9.4   GFR: Estimated Creatinine Clearance: 31.9 mL/min (A) (by C-G formula based on SCr of 1.86 mg/dL (H)). Liver Function Tests: Recent Labs  Lab 06/26/23 1403  AST 27  ALT 24  ALKPHOS 149*  BILITOT 0.8  PROT 7.7  ALBUMIN 3.5   Coagulation Profile: Recent Labs  Lab 06/26/23 1403  INR 1.0   CBG: Recent Labs  Lab 06/26/23 1411  GLUCAP 196*   Urine analysis:    Component Value Date/Time   COLORURINE YELLOW  06/26/2023 1712   APPEARANCEUR CLOUDY (A) 06/26/2023 1712   APPEARANCEUR Cloudy (A) 03/14/2023 1428   LABSPEC 1.006 06/26/2023 1712  PHURINE 8.0 06/26/2023 1712   GLUCOSEU 50 (A) 06/26/2023 1712   HGBUR LARGE (A) 06/26/2023 1712   BILIRUBINUR NEGATIVE 06/26/2023 1712   BILIRUBINUR Negative 03/14/2023 1428   KETONESUR NEGATIVE 06/26/2023 1712   PROTEINUR 30 (A) 06/26/2023 1712   UROBILINOGEN negative 12/18/2014 1601   UROBILINOGEN 1.0 04/06/2010 0509   NITRITE NEGATIVE 06/26/2023 1712   LEUKOCYTESUR LARGE (A) 06/26/2023 1712    Radiological Exams on Admission: CT Angio Chest PE W/Cm &/Or Wo Cm  Result Date: 06/26/2023 CLINICAL DATA:  Abdominal pain, lethargy EXAM: CT ANGIOGRAPHY CHEST CT ABDOMEN AND PELVIS WITH CONTRAST TECHNIQUE: Multidetector CT imaging of the chest was performed using the standard protocol during bolus administration of intravenous contrast. Multiplanar CT image reconstructions and MIPs were obtained to evaluate the vascular anatomy. Multidetector CT imaging of the abdomen and pelvis was performed using the standard protocol during bolus administration of intravenous contrast. RADIATION DOSE REDUCTION: This exam was performed according to the departmental dose-optimization program which includes automated exposure control, adjustment of the mA and/or kV according to patient size and/or use of iterative reconstruction technique. CONTRAST:  80mL OMNIPAQUE IOHEXOL 350 MG/ML SOLN COMPARISON:  CT abdomen/pelvis dated 01/01/2023 FINDINGS: CTA CHEST FINDINGS Cardiovascular: Status are opacification of the bilateral pulmonary arteries to the segmental level. No evidence of pulmonary embolism. Although not tailored for evaluation of the thoracic aorta, there is no evidence of thoracic aortic aneurysm or dissection. The heart is normal in size.  No pericardial effusion. Severe three-vessel coronary atherosclerosis. Mediastinum/Nodes: No suspicious mediastinal adenopathy. Visualized  thyroid is unremarkable. Lungs/Pleura: Dependent atelectasis in the bilateral upper and lower lobes. No focal consolidation. No suspicious pulmonary nodules. No pleural effusion or pneumothorax. Musculoskeletal: Visualized osseous structures are within normal limits. Review of the MIP images confirms the above findings. CT ABDOMEN and PELVIS FINDINGS Hepatobiliary: Liver is within normal limits. Gallbladder is unremarkable. No intrahepatic or extrahepatic duct dilatation. Pancreas: Lesion along the inferior aspect of the proximal pancreatic tail, measuring at least 2.5 x 3.1 cm (series 4/image 29), new from prior, with suspected mild parenchymal atrophy of the distal pancreatic tail (series 4/image 31). Although poorly visualized/evaluated, this raises concern for pancreatic adenocarcinoma. Focal pancreatitis is considered less likely. Spleen: Within normal limits. Adrenals/Urinary Tract: Adrenal glands are within normal limits. Kidneys are within normal limits. Mild to moderate right hydronephrosis with urothelial thickening. Mild left hydronephrosis with urothelial thickening. No renal calculi. On delayed imaging, there is an apparent 4 defect in the right renal collecting system (series 8/image 17), although without associated enhancing lesion on the arterial phase, possibly reflecting debris. Thick-walled bladder with indwelling Foley catheter and mild perivesical stranding (series 4/image 85), suggesting cystitis. Stomach/Bowel: Stomach is notable for a small hiatal hernia. No evidence of bowel obstruction. Normal appendix (series 4/image 48). No colonic wall thickening or inflammatory changes. Vascular/Lymphatic: No evidence of abdominal aortic aneurysm. Atherosclerotic calcifications of the abdominal aorta and branch vessels, although vessels remain patent. Celiac axis abuts the suspected lesion. No suspicious abdominopelvic lymphadenopathy. Reproductive: Prostate is unremarkable. Other: No abdominopelvic  ascites. Musculoskeletal: Degenerative changes of the lumbar spine. Review of the MIP images confirms the above findings. IMPRESSION: No evidence of pulmonary embolism. 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 Focal pancreatitis is considered less likely. Consider EUS for tissue sampling. When patient is stable (without concerns of motion degradation), short-term follow-up MRI abdomen with/without contrast is also suggested, as clinically warranted. Thick-walled bladder with  indwelling Foley catheter and mild perivesical stranding, suggesting cystitis. Suspected ascending infection involving the bilateral proximal collecting systems. Possible filling defect in the right renal collecting system on delayed imaging raises concern for debris. Additional ancillary findings as above. Electronically Signed   By: Charline Bills M.D.   On: 06/26/2023 17:38   CT ABDOMEN PELVIS W CONTRAST  Result Date: 06/26/2023 CLINICAL DATA:  Abdominal pain, lethargy EXAM: CT ANGIOGRAPHY CHEST CT ABDOMEN AND PELVIS WITH CONTRAST TECHNIQUE: Multidetector CT imaging of the chest was performed using the standard protocol during bolus administration of intravenous contrast. Multiplanar CT image reconstructions and MIPs were obtained to evaluate the vascular anatomy. Multidetector CT imaging of the abdomen and pelvis was performed using the standard protocol during bolus administration of intravenous contrast. RADIATION DOSE REDUCTION: This exam was performed according to the departmental dose-optimization program which includes automated exposure control, adjustment of the mA and/or kV according to patient size and/or use of iterative reconstruction technique. CONTRAST:  80mL OMNIPAQUE IOHEXOL 350 MG/ML SOLN COMPARISON:  CT abdomen/pelvis dated 01/01/2023 FINDINGS: CTA CHEST FINDINGS Cardiovascular: Status are opacification of the bilateral  pulmonary arteries to the segmental level. No evidence of pulmonary embolism. Although not tailored for evaluation of the thoracic aorta, there is no evidence of thoracic aortic aneurysm or dissection. The heart is normal in size.  No pericardial effusion. Severe three-vessel coronary atherosclerosis. Mediastinum/Nodes: No suspicious mediastinal adenopathy. Visualized thyroid is unremarkable. Lungs/Pleura: Dependent atelectasis in the bilateral upper and lower lobes. No focal consolidation. No suspicious pulmonary nodules. No pleural effusion or pneumothorax. Musculoskeletal: Visualized osseous structures are within normal limits. Review of the MIP images confirms the above findings. CT ABDOMEN and PELVIS FINDINGS Hepatobiliary: Liver is within normal limits. Gallbladder is unremarkable. No intrahepatic or extrahepatic duct dilatation. Pancreas: Lesion along the inferior aspect of the proximal pancreatic tail, measuring at least 2.5 x 3.1 cm (series 4/image 29), new from prior, with suspected mild parenchymal atrophy of the distal pancreatic tail (series 4/image 31). Although poorly visualized/evaluated, this raises concern for pancreatic adenocarcinoma. Focal pancreatitis is considered less likely. Spleen: Within normal limits. Adrenals/Urinary Tract: Adrenal glands are within normal limits. Kidneys are within normal limits. Mild to moderate right hydronephrosis with urothelial thickening. Mild left hydronephrosis with urothelial thickening. No renal calculi. On delayed imaging, there is an apparent 4 defect in the right renal collecting system (series 8/image 17), although without associated enhancing lesion on the arterial phase, possibly reflecting debris. Thick-walled bladder with indwelling Foley catheter and mild perivesical stranding (series 4/image 85), suggesting cystitis. Stomach/Bowel: Stomach is notable for a small hiatal hernia. No evidence of bowel obstruction. Normal appendix (series 4/image 48). No  colonic wall thickening or inflammatory changes. Vascular/Lymphatic: No evidence of abdominal aortic aneurysm. Atherosclerotic calcifications of the abdominal aorta and branch vessels, although vessels remain patent. Celiac axis abuts the suspected lesion. No suspicious abdominopelvic lymphadenopathy. Reproductive: Prostate is unremarkable. Other: No abdominopelvic ascites. Musculoskeletal: Degenerative changes of the lumbar spine. Review of the MIP images confirms the above findings. IMPRESSION: No evidence of pulmonary embolism. 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 Focal pancreatitis is considered less likely. Consider EUS for tissue sampling. When patient is stable (without concerns of motion degradation), short-term follow-up MRI abdomen with/without contrast is also suggested, as clinically warranted. Thick-walled bladder with indwelling Foley catheter and mild perivesical stranding, suggesting cystitis. Suspected ascending infection involving the bilateral proximal collecting systems. Possible filling defect in the  right renal collecting system on delayed imaging raises concern for debris. Additional ancillary findings as above. Electronically Signed   By: Charline Bills M.D.   On: 06/26/2023 17:38   CT Head Wo Contrast  Result Date: 06/26/2023 CLINICAL DATA:  Mental status change, unknown cause EXAM: CT HEAD WITHOUT CONTRAST TECHNIQUE: Contiguous axial images were obtained from the base of the skull through the vertex without intravenous contrast. RADIATION DOSE REDUCTION: This exam was performed according to the departmental dose-optimization program which includes automated exposure control, adjustment of the mA and/or kV according to patient size and/or use of iterative reconstruction technique. COMPARISON:  CT Head 01/07/22 FINDINGS: Brain: No hemorrhage. No hydrocephalus. No extra-axial fluid  collection. No CT evidence of an acute cortical infarct. No mass effect. No mass lesion. Vascular: No hyperdense vessel or unexpected calcification. Skull: Mild soft tissue swelling along the posterior scalp. Negative for fracture or focal lesion. Sinuses/Orbits: No middle ear or mastoid effusion. Paranasal sinuses are notable for mucosal thickening in the bilateral maxillary sinuses. Bilateral lens replacement. Orbits are otherwise unremarkable. Other: None. IMPRESSION: No acute intracranial abnormality. Electronically Signed   By: Lorenza Cambridge M.D.   On: 06/26/2023 16:42   DG Chest 2 View  Result Date: 06/26/2023 CLINICAL DATA:  Lethargy. Altered mental status. Urinary tract infection. EXAM: CHEST - 2 VIEW COMPARISON:  01/07/2022 FINDINGS: Low lung volumes are again seen. Heart size is stable. No evidence of pulmonary infiltrate or pleural effusion. IMPRESSION: Low lung volumes. No active cardiopulmonary disease. Electronically Signed   By: Danae Orleans M.D.   On: 06/26/2023 15:19    EKG: Independently reviewed.  Appears to be sinus rhythm with P waves present, rhythm regular, rate 101, QTc 497.  Old RBBB and  LAFB.  Read by device as A-fib.  Assessment/Plan Principal Problem:   Complicated UTI (urinary tract infection) Active Problems:   Acute metabolic encephalopathy   Sepsis (HCC)   MS (multiple sclerosis) (HCC)   Chronic indwelling Foley catheter   Hyponatremia   Hypertension associated with type 2 diabetes mellitus (HCC)   BPH with obstruction/lower urinary tract symptoms   Hyperlipidemia associated with type 2 diabetes mellitus (HCC)   Assessment and Plan: * Complicated UTI (urinary tract infection) Complicated by presence of chronic indwelling Foley catheter.  Rules in for sepsis with leukocytosis of 25.4, respiratory 16-23, blood pressure systolic down to 78G.  Improved with fluid bolus.  Lactic acid normal at 1.9  > 1.7.  Last urine cultures June and July this year grew  beta-hemolytic strep, and Serratia marcescens-sensitive to cefepime ceftriaxone.   - IV Cefepime started in ED, continue -2.25 L bolus given, continue N/s 100cc/hr x 15hrs -Follow-up blood cultures  Acute metabolic encephalopathy Likely secondary to severe sepsis.  Head CT without acute abnormality.  Baseline AMS, per family no memory deficits.  Currently awake, lethargic, following directions.  Scrotum also appears erythematous, with mild tenderness and mild firmness on exam to right scrotum.  CT shows unremarkable prostate -Obtain ultrasound of scrotum -IV antibiotics IV fluids  Hyponatremia Sodium 127.  Hydrate with normal saline  MS (multiple sclerosis) (HCC) Ambulates with wheelchair.  Weakness to right lower extremity, phonic indwelling Foley catheter.  Lives at home with family.  Hypertension associated with type 2 diabetes mellitus (HCC) Hypotension in ED. -Hold olmesartan, Imdur  Hyperlipidemia associated with type 2 diabetes mellitus (HCC) - SSI- M -Hold home Lantus, semaglutide for now     DVT prophylaxis: Lovenox Code Status: FULL Code Family Communication: Spouse-  Steward Drone and daughter- Mitzi at bedside Disposition Plan: > 2 days Consults called: None Admission status: Inpt Stepdown I certify that at the point of admission it is my clinical judgment that the patient will require inpatient hospital care spanning beyond 2 midnights from the point of admission due to high intensity of service, high risk for further deterioration and high frequency of surveillance required.   Author: Onnie Boer, MD 06/26/2023 9:15 PM  For on call review [email or web address].

## 2023-06-26 NOTE — ED Provider Notes (Signed)
[place] EMERGENCY DEPARTMENT AT [facility] Provider Note   CSN: [id number] Arrival date & time: 06/26/23  1256     History  Chief Complaint  Patient presents with   Altered Mental Status    Guillaume Brummer is a 72 y.o. male.   Altered Mental Status   72 year old male presents emergency department accompanied by family members with complaints of altered mentation, cloudy urine.  Daughter stated that she noted blood coming out of catheter bag the past few days with acute change in mentation and noting thick greenish-whitish collection in catheter bag this morning.  Denies any known fever, chills, abdominal pain, nausea, vomiting.  Patient states that he has been with cough for the past couple of days with slightly worsening shortness of breath from baseline.  Patient with history of MS with chronic Foley catheter placement.  Past medical history significant for diabetes mellitus type 2, MS, right foot drop, IBS, RBBB, BPH, diplegia  Home Medications Prior to Admission medications   Medication Sig Start Date End Date Taking? Authorizing Provider  Ascorbic Acid (VITAMIN C PO) Take 500 mg by mouth in the morning and at bedtime.   Yes [provider]  aspirin 81 MG EC tablet Take 81 mg by mouth daily.   Yes [provider]  Cholecalciferol (VITAMIN D) 50 MCG (2000 UT) tablet Take 2,000 Units by mouth daily.   Yes [provider]  cyanocobalamin (VITAMIN B12) 1000 MCG/ML injection INJECT INTO THE SKIN ONCE A MONTH 11/01/22  Yes Daphine Deutscher, Mary-Margaret, FNP  ferrous sulfate 325 (65 FE) MG tablet Take 1 tablet (325 mg total) by mouth daily with breakfast. 01/26/23  Yes Daphine Deutscher, Mary-Margaret, FNP  finasteride (PROSCAR) 5 MG tablet Take 1 tablet (5 mg total) by mouth daily. 01/26/23  Yes Martin, Mary-Margaret, FNP  insulin glargine (LANTUS SOLOSTAR) 100 UNIT/ML Solostar Pen Inject 55 Units into the skin daily. 04/16/23  Yes Daphine Deutscher, Mary-Margaret,  FNP  isosorbide mononitrate (IMDUR) 60 MG 24 hr tablet TAKE 1/2 TABLET (30MG  TOTAL) BY MOUTH DAILY 04/16/23  Yes Daphine Deutscher, Mary-Margaret, FNP  levothyroxine (SYNTHROID) 88 MCG tablet Take 1 tablet (88 mcg total) by mouth daily. 04/17/23  Yes Martin, Mary-Margaret, FNP  magnesium oxide (MAG-OX) 400 MG tablet Take 1 tablet by mouth daily.   Yes [provider]  olmesartan (BENICAR) 5 MG tablet Take 1 tablet (5 mg total) by mouth daily. 04/16/23 04/15/24 Yes Martin, Mary-Margaret, FNP  omeprazole (PRILOSEC) 20 MG capsule TAKE ONE CAPSULE (20MG  TOTAL) BY MOUTH DAILY 05/10/23  Yes Daphine Deutscher, Mary-Margaret, FNP  rosuvastatin (CRESTOR) 10 MG tablet Take 1 tablet (10 mg total) by mouth daily. 04/16/23  Yes Martin, Mary-Margaret, FNP  Semaglutide,0.25 or 0.5MG /DOS, (OZEMPIC, 0.25 OR 0.5 MG/DOSE,) 2 MG/3ML SOPN Inject 0.25mg  into skin weekly for 4 weeks, then increase to 0.5mg  weekly thereafter. DX:E11.65 05/14/23  Yes Martin, Mary-Margaret, FNP  silodosin (RAPAFLO) 8 MG CAPS capsule Take 1 capsule (8 mg total) by mouth at bedtime. 04/16/23  Yes Martin, Mary-Margaret, FNP  sodium bicarbonate 650 MG tablet Take 650 mg by mouth 3 (three) times daily.   Yes [provider]  sodium citrate-citric acid (ORACIT) 500-334 MG/5ML solution Take 15 mLs by mouth 2 (two) times daily. 02/06/23  Yes [provider]  glucose blood test strip Use as instructed 07/06/14   Ernestina Penna, MD  Insulin Pen Needle 31G X 5 MM MISC Use with lantus pen as directed 04/16/23   Bennie Pierini, FNP  SPS, SODIUM POLYSTYRENE  SULF, 15 GM/60ML suspension Take 15 mLs by mouth in the morning and at bedtime. 06/11/23   [provider]  atorvastatin (LIPITOR) 10 MG tablet Take 10 mg by mouth daily.  09/27/11  [provider]  niacin-simvastatin (SIMCOR) 500-20 MG 24 hr tablet Take 1 tablet by mouth at bedtime.    09/27/11  [provider]      Allergies    Farxiga [dapagliflozin] and Chauncey Mann  [finerenone]    Review of Systems   Review of Systems  All other systems reviewed and are negative.   Physical Exam Updated Vital Signs BP 126/75   Pulse 81   Temp 98.1 F (36.7 C) (Oral)   Resp 14   Ht 5\' 6"  (1.676 m)   Wt 75.7 kg   SpO2 95%   BMI 26.94 kg/m  Physical Exam Vitals and nursing note reviewed. Exam conducted with a chaperone present.  Constitutional:      General: He is not in acute distress.    Appearance: He is well-developed. He is ill-appearing.  HENT:     Head: Normocephalic and atraumatic.  Eyes:     Conjunctiva/sclera: Conjunctivae normal.  Cardiovascular:     Rate and Rhythm: Normal rate and regular rhythm.     Heart sounds: No murmur heard. Pulmonary:     Effort: Pulmonary effort is normal. No respiratory distress.     Breath sounds: Wheezing and rhonchi present.  Abdominal:     Palpations: Abdomen is soft.     Tenderness: There is no abdominal tenderness.  Genitourinary:    Comments: Patient with erythema appreciated on scrotum.  Significantly enlarged right testicle with overlying tenderness most so in epididymal region.  Cremaster reflex intact bilaterally.  No obvious extension of erythema to perineum. Musculoskeletal:        General: No swelling.     Cervical back: Neck supple.     Right lower leg: No edema.     Left lower leg: No edema.  Skin:    General: Skin is warm and dry.     Capillary Refill: Capillary refill takes less than 2 seconds.     Comments: See media below for pressure wound  Neurological:     Mental Status: He is alert.     Comments: Alert and oriented to self, place.  Patient with seemingly waxing and waning confusion.  Certain evaluations able to respond where he is at his name but others, does not respond coherently.  Speech is somewhat mumbled.  Strength symmetric bilateral upper extremities.  Unable to move right lower extremity with minimal ankle dorsi/plantarflexion appreciated on left side.  States that this is  baseline. Sensation intact in upper/lower extremities   Gait not assessed CN I not tested  CN II not tested CN III, IV, VI PERRLA and EOMs intact bilaterally  CN V Intact sensation to sharp and light touch to the face  CN VII facial movements symmetric  CN VIII not tested  CN IX, X no uvula deviation, symmetric rise of soft palate  CN XI symmetric SCM and trapezius strength bilaterally  CN XII Midline tongue protrusion, symmetric L/R movements     Psychiatric:        Mood and Affect: Mood normal.     ED Results / Procedures / Treatments   Labs (all labs ordered are listed, but only abnormal results are displayed) Labs Reviewed  COMPREHENSIVE METABOLIC PANEL - Abnormal; Notable for the following components:      Result Value  Sodium 127 (*)    Chloride 92 (*)    Glucose, Bld 208 (*)    Creatinine, Ser 1.86 (*)    Alkaline Phosphatase 149 (*)    GFR, Estimated 38 (*)    All other components within normal limits  CBC WITH DIFFERENTIAL/PLATELET - Abnormal; Notable for the following components:   WBC 25.4 (*)    Hemoglobin 11.4 (*)    HCT 35.6 (*)    RDW 15.9 (*)    Neutro Abs 21.7 (*)    Monocytes Absolute 2.5 (*)    Abs Immature Granulocytes 0.18 (*)    All other components within normal limits  URINALYSIS, W/ REFLEX TO CULTURE (INFECTION SUSPECTED) - Abnormal; Notable for the following components:   APPearance TURBID (*)    Glucose, UA 50 (*)    Hgb urine dipstick SMALL (*)    Protein, ur 100 (*)    Leukocytes,Ua MODERATE (*)    All other components within normal limits  URINALYSIS, W/ REFLEX TO CULTURE (INFECTION SUSPECTED) - Abnormal; Notable for the following components:   APPearance CLOUDY (*)    Glucose, UA 50 (*)    Hgb urine dipstick LARGE (*)    Protein, ur 30 (*)    Leukocytes,Ua LARGE (*)    Bacteria, UA RARE (*)    All other components within normal limits  BLOOD GAS, VENOUS - Abnormal; Notable for the following components:   pCO2, Ven 34 (*)     pO2, Ven 58 (*)    Acid-base deficit 2.3 (*)    All other components within normal limits  BRAIN NATRIURETIC PEPTIDE - Abnormal; Notable for the following components:   B Natriuretic Peptide 465.0 (*)    All other components within normal limits  BASIC METABOLIC PANEL - Abnormal; Notable for the following components:   CO2 20 (*)    Creatinine, Ser 1.55 (*)    Calcium 8.8 (*)    GFR, Estimated 47 (*)    All other components within normal limits  CBC - Abnormal; Notable for the following components:   WBC 14.7 (*)    RBC 3.64 (*)    Hemoglobin 9.5 (*)    HCT 30.5 (*)    RDW 15.6 (*)    All other components within normal limits  CBG MONITORING, ED - Abnormal; Notable for the following components:   Glucose-Capillary 196 (*)    All other components within normal limits  CULTURE, BLOOD (ROUTINE X 2)  CULTURE, BLOOD (ROUTINE X 2)  URINE CULTURE  RESP PANEL BY RT-PCR (RSV, FLU A&B, COVID)  RVPGX2  URINE CULTURE  MRSA NEXT GEN BY PCR, NASAL  LACTIC ACID, PLASMA  LACTIC ACID, PLASMA  PROTIME-INR  APTT  AMMONIA  GLUCOSE, CAPILLARY  GLUCOSE, CAPILLARY  GLUCOSE, CAPILLARY  GLUCOSE, CAPILLARY    EKG EKG Interpretation Date/Time:  Tuesday June 26 2023 13:50:24 EST Ventricular Rate:  101 PR Interval:    QRS Duration:  147 QT Interval:  383 QTC Calculation: 497 R Axis:   -77  Text Interpretation: Normal sinus rhythm RBBB and LAFB Confirmed by Nicanor Alcon, April ([id number]) on 06/27/2023 8:56:51 AM  Radiology CT Angio Chest PE W/Cm &/Or Wo Cm  Result Date: 06/26/2023 CLINICAL DATA:  Abdominal pain, lethargy EXAM: CT ANGIOGRAPHY CHEST CT ABDOMEN AND PELVIS WITH CONTRAST TECHNIQUE: Multidetector CT imaging of the chest was performed using the standard protocol during bolus administration of intravenous contrast. Multiplanar CT image reconstructions and MIPs were obtained to evaluate the vascular anatomy. Multidetector CT  imaging of the abdomen and pelvis was performed using the  standard protocol during bolus administration of intravenous contrast. RADIATION DOSE REDUCTION: This exam was performed according to the departmental dose-optimization program which includes automated exposure control, adjustment of the mA and/or kV according to patient size and/or use of iterative reconstruction technique. CONTRAST:  80mL OMNIPAQUE IOHEXOL 350 MG/ML SOLN COMPARISON:  CT abdomen/pelvis dated 01/01/2023 FINDINGS: CTA CHEST FINDINGS Cardiovascular: Status are opacification of the bilateral pulmonary arteries to the segmental level. No evidence of pulmonary embolism. Although not tailored for evaluation of the thoracic aorta, there is no evidence of thoracic aortic aneurysm or dissection. The heart is normal in size.  No pericardial effusion. Severe three-vessel coronary atherosclerosis. Mediastinum/Nodes: No suspicious mediastinal adenopathy. Visualized thyroid is unremarkable. Lungs/Pleura: Dependent atelectasis in the bilateral upper and lower lobes. No focal consolidation. No suspicious pulmonary nodules. No pleural effusion or pneumothorax. Musculoskeletal: Visualized osseous structures are within normal limits. Review of the MIP images confirms the above findings. CT ABDOMEN and PELVIS FINDINGS Hepatobiliary: Liver is within normal limits. Gallbladder is unremarkable. No intrahepatic or extrahepatic duct dilatation. Pancreas: Lesion along the inferior aspect of the proximal pancreatic tail, measuring at least 2.5 x 3.1 cm (series 4/image 29), new from prior, with suspected mild parenchymal atrophy of the distal pancreatic tail (series 4/image 31). Although poorly visualized/evaluated, this raises concern for pancreatic adenocarcinoma. Focal pancreatitis is considered less likely. Spleen: Within normal limits. Adrenals/Urinary Tract: Adrenal glands are within normal limits. Kidneys are within normal limits. Mild to moderate right hydronephrosis with urothelial thickening. Mild left hydronephrosis  with urothelial thickening. No renal calculi. On delayed imaging, there is an apparent 4 defect in the right renal collecting system (series 8/image 17), although without associated enhancing lesion on the arterial phase, possibly reflecting debris. Thick-walled bladder with indwelling Foley catheter and mild perivesical stranding (series 4/image 85), suggesting cystitis. Stomach/Bowel: Stomach is notable for a small hiatal hernia. No evidence of bowel obstruction. Normal appendix (series 4/image 48). No colonic wall thickening or inflammatory changes. Vascular/Lymphatic: No evidence of abdominal aortic aneurysm. Atherosclerotic calcifications of the abdominal aorta and branch vessels, although vessels remain patent. Celiac axis abuts the suspected lesion. No suspicious abdominopelvic lymphadenopathy. Reproductive: Prostate is unremarkable. Other: No abdominopelvic ascites. Musculoskeletal: Degenerative changes of the lumbar spine. Review of the MIP images confirms the above findings. IMPRESSION: No evidence of pulmonary embolism. 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 Focal pancreatitis is considered less likely. Consider EUS for tissue sampling. When patient is stable (without concerns of motion degradation), short-term follow-up MRI abdomen with/without contrast is also suggested, as clinically warranted. Thick-walled bladder with indwelling Foley catheter and mild perivesical stranding, suggesting cystitis. Suspected ascending infection involving the bilateral proximal collecting systems. Possible filling defect in the right renal collecting system on delayed imaging raises concern for debris. Additional ancillary findings as above. Electronically Signed   By: Charline Bills M.D.   On: 06/26/2023 17:38   CT ABDOMEN PELVIS W CONTRAST  Result Date: 06/26/2023 CLINICAL DATA:  Abdominal pain, lethargy EXAM: CT  ANGIOGRAPHY CHEST CT ABDOMEN AND PELVIS WITH CONTRAST TECHNIQUE: Multidetector CT imaging of the chest was performed using the standard protocol during bolus administration of intravenous contrast. Multiplanar CT image reconstructions and MIPs were obtained to evaluate the vascular anatomy. Multidetector CT imaging of the abdomen and pelvis was performed using the standard protocol during bolus administration of intravenous contrast. RADIATION DOSE REDUCTION: This  exam was performed according to the departmental dose-optimization program which includes automated exposure control, adjustment of the mA and/or kV according to patient size and/or use of iterative reconstruction technique. CONTRAST:  80mL OMNIPAQUE IOHEXOL 350 MG/ML SOLN COMPARISON:  CT abdomen/pelvis dated 01/01/2023 FINDINGS: CTA CHEST FINDINGS Cardiovascular: Status are opacification of the bilateral pulmonary arteries to the segmental level. No evidence of pulmonary embolism. Although not tailored for evaluation of the thoracic aorta, there is no evidence of thoracic aortic aneurysm or dissection. The heart is normal in size.  No pericardial effusion. Severe three-vessel coronary atherosclerosis. Mediastinum/Nodes: No suspicious mediastinal adenopathy. Visualized thyroid is unremarkable. Lungs/Pleura: Dependent atelectasis in the bilateral upper and lower lobes. No focal consolidation. No suspicious pulmonary nodules. No pleural effusion or pneumothorax. Musculoskeletal: Visualized osseous structures are within normal limits. Review of the MIP images confirms the above findings. CT ABDOMEN and PELVIS FINDINGS Hepatobiliary: Liver is within normal limits. Gallbladder is unremarkable. No intrahepatic or extrahepatic duct dilatation. Pancreas: Lesion along the inferior aspect of the proximal pancreatic tail, measuring at least 2.5 x 3.1 cm (series 4/image 29), new from prior, with suspected mild parenchymal atrophy of the distal pancreatic tail (series  4/image 31). Although poorly visualized/evaluated, this raises concern for pancreatic adenocarcinoma. Focal pancreatitis is considered less likely. Spleen: Within normal limits. Adrenals/Urinary Tract: Adrenal glands are within normal limits. Kidneys are within normal limits. Mild to moderate right hydronephrosis with urothelial thickening. Mild left hydronephrosis with urothelial thickening. No renal calculi. On delayed imaging, there is an apparent 4 defect in the right renal collecting system (series 8/image 17), although without associated enhancing lesion on the arterial phase, possibly reflecting debris. Thick-walled bladder with indwelling Foley catheter and mild perivesical stranding (series 4/image 85), suggesting cystitis. Stomach/Bowel: Stomach is notable for a small hiatal hernia. No evidence of bowel obstruction. Normal appendix (series 4/image 48). No colonic wall thickening or inflammatory changes. Vascular/Lymphatic: No evidence of abdominal aortic aneurysm. Atherosclerotic calcifications of the abdominal aorta and branch vessels, although vessels remain patent. Celiac axis abuts the suspected lesion. No suspicious abdominopelvic lymphadenopathy. Reproductive: Prostate is unremarkable. Other: No abdominopelvic ascites. Musculoskeletal: Degenerative changes of the lumbar spine. Review of the MIP images confirms the above findings. IMPRESSION: No evidence of pulmonary embolism. 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 Focal pancreatitis is considered less likely. Consider EUS for tissue sampling. When patient is stable (without concerns of motion degradation), short-term follow-up MRI abdomen with/without contrast is also suggested, as clinically warranted. Thick-walled bladder with indwelling Foley catheter and mild perivesical stranding, suggesting cystitis. Suspected ascending infection involving the  bilateral proximal collecting systems. Possible filling defect in the right renal collecting system on delayed imaging raises concern for debris. Additional ancillary findings as above. Electronically Signed   By: Charline Bills M.D.   On: 06/26/2023 17:38   CT Head Wo Contrast  Result Date: 06/26/2023 CLINICAL DATA:  Mental status change, unknown cause EXAM: CT HEAD WITHOUT CONTRAST TECHNIQUE: Contiguous axial images were obtained from the base of the skull through the vertex without intravenous contrast. RADIATION DOSE REDUCTION: This exam was performed according to the departmental dose-optimization program which includes automated exposure control, adjustment of the mA and/or kV according to patient size and/or use of iterative reconstruction technique. COMPARISON:  CT Head 01/07/22 FINDINGS: Brain: No hemorrhage. No hydrocephalus. No extra-axial fluid collection. No CT evidence of an acute cortical infarct. No mass effect. No mass lesion. Vascular: No  hyperdense vessel or unexpected calcification. Skull: Mild soft tissue swelling along the posterior scalp. Negative for fracture or focal lesion. Sinuses/Orbits: No middle ear or mastoid effusion. Paranasal sinuses are notable for mucosal thickening in the bilateral maxillary sinuses. Bilateral lens replacement. Orbits are otherwise unremarkable. Other: None. IMPRESSION: No acute intracranial abnormality. Electronically Signed   By: Lorenza Cambridge M.D.   On: 06/26/2023 16:42   DG Chest 2 View  Result Date: 06/26/2023 CLINICAL DATA:  Lethargy. Altered mental status. Urinary tract infection. EXAM: CHEST - 2 VIEW COMPARISON:  01/07/2022 FINDINGS: Low lung volumes are again seen. Heart size is stable. No evidence of pulmonary infiltrate or pleural effusion. IMPRESSION: Low lung volumes. No active cardiopulmonary disease. Electronically Signed   By: Danae Orleans M.D.   On: 06/26/2023 15:19    Procedures BLADDER CATHETERIZATION  Date/Time: 06/26/2023  4:04 PM  Performed by: Peter Garter, PA Authorized by: Peter Garter, PA   Consent:    Consent obtained:  Verbal   Consent given by:  Patient   Risks, benefits, and alternatives were discussed: yes     Risks discussed:  False passage, incomplete procedure, infection, pain and urethral injury   Alternatives discussed:  No treatment and delayed treatment Universal protocol:    Procedure explained and questions answered to patient or proxy's satisfaction: yes     Patient identity confirmed:  Verbally with patient and arm band Pre-procedure details:    Procedure purpose:  Diagnostic   Preparation: Patient was prepped and draped in usual sterile fashion   Anesthesia:    Anesthesia method:  None Procedure details:    Provider performed due to:  Nurse unable to complete   Catheter insertion:  Indwelling   Catheter type:  Coude   Catheter size:  16 Fr   Bladder irrigation: no     Number of attempts:  1   Urine characteristics:  Clear and yellow Post-procedure details:    Procedure completion:  Tolerated well, no immediate complications .Critical Care  Performed by: Peter Garter, PA Authorized by: Peter Garter, PA   Critical care provider statement:    Critical care time (minutes):  63   Critical care was necessary to treat or prevent imminent or life-threatening deterioration of the following conditions:  Sepsis and shock   Critical care was time spent personally by me on the following activities:  Development of treatment plan with patient or surrogate, discussions with consultants, evaluation of patient's response to treatment, examination of patient, ordering and review of laboratory studies, ordering and review of radiographic studies, ordering and performing treatments and interventions, pulse oximetry, re-evaluation of patient's condition and review of old charts   I assumed direction of critical care for this patient from another provider in my specialty: no      Care discussed with: admitting provider       Medications Ordered in ED Medications  ceFEPIme (MAXIPIME) 2 g in sodium chloride 0.9 % 100 mL IVPB (2 g Intravenous New Bag/Given 06/27/23 0347)  aspirin EC tablet 81 mg (81 mg Oral Given 06/27/23 0850)  finasteride (PROSCAR) tablet 5 mg (5 mg Oral Given 06/27/23 0850)  levothyroxine (SYNTHROID) tablet 88 mcg (88 mcg Oral Not Given 06/27/23 0454)  pantoprazole (PROTONIX) EC tablet 40 mg (40 mg Oral Given 06/27/23 0850)  insulin aspart (novoLOG) injection 0-15 Units ( Subcutaneous Not Given 06/27/23 0751)  enoxaparin (LOVENOX) injection 40 mg (40 mg Subcutaneous Given 06/27/23 0851)  0.9 %  sodium chloride infusion (  Intravenous New Bag/Given 06/27/23 0843)  acetaminophen (TYLENOL) tablet 650 mg (has no administration in time range)    Or  acetaminophen (TYLENOL) suppository 650 mg (has no administration in time range)  ondansetron (ZOFRAN) tablet 4 mg (has no administration in time range)    Or  ondansetron (ZOFRAN) injection 4 mg (has no administration in time range)  polyethylene glycol (MIRALAX / GLYCOLAX) packet 17 g (has no administration in time range)  Chlorhexidine Gluconate Cloth 2 % PADS 6 each (has no administration in time range)  leptosperum manuka honey (THERAHONEY) gel 1 Application (has no administration in time range)  Gerhardt's butt cream (has no administration in time range)  ceFEPIme (MAXIPIME) 2 g in sodium chloride 0.9 % 100 mL IVPB (0 g Intravenous Stopped 06/26/23 1604)  sodium chloride 0.9 % bolus 1,000 mL (0 mLs Intravenous Stopped 06/26/23 1513)    And  sodium chloride 0.9 % bolus 1,000 mL (0 mLs Intravenous Stopped 06/26/23 1759)    And  sodium chloride 0.9 % bolus 250 mL (0 mLs Intravenous Stopped 06/26/23 1800)  iohexol (OMNIPAQUE) 350 MG/ML injection 80 mL (80 mLs Intravenous Contrast Given 06/26/23 1621)    ED Course/ Medical Decision Making/ A&P Clinical Course as of 06/27/23 0943  Tue Jun 26, 2023   1830 Consulted attending physician Dr. Jarold Motto who independently evaluated the patient given that patient had reading of hypotension reported worsening mentation.  Upon reevaluation, patient's blood pressure improved with an Trendelenburg to the 1 teens/120 systolic.  Patient's confusion seems to be waxing and waning throughout course in the ED with some episodes of more mumbled, noncoherent speech and at other times able to respond to name as well as hospital where he is currently located.  Will continue with admission. [CR]  1839 Consulted hospitalist provide patient agreed with admission. [CR]    Clinical Course User Index [CR] Peter Garter, PA                                 Medical Decision Making Amount and/or Complexity of Data Reviewed Labs: ordered. Radiology: ordered. ECG/medicine tests: ordered.  Risk Prescription drug management. Decision regarding hospitalization.   This patient presents to the ED for concern of altered mentation, cloudy urine, this involves an extensive number of treatment options, and is a complaint that carries with it a high risk of complications and morbidity.  The differential diagnosis includes CVA, urosepsis, pneumonia, other infectious etiology, metabolic encephalopathy, medication side effect, other   Co morbidities that complicate the patient evaluation  See HPI   Additional history obtained:  Additional history obtained from EMR External records from outside source obtained and reviewed including hospital records   Lab Tests:  I Ordered, and personally interpreted labs.  The pertinent results include: Leukocytosis of 25.4 with left shift.  Evidence of anemia with a hemoglobin 11.4.  Placed within range.  Hyponatremia of 127, hypochloremia of 92.  No transaminitis.  Isolated elevation of alkaline phosphatase of 149.  Creatinine 1.86, BUN of 23 EGFR 38 of which is near patient's baseline.  Plan negative.  His lactic of 1.7.  PT/INR  and APTT within normal limits.   Imaging Studies ordered:  I ordered imaging studies including chest ray, CT angio chest PE, CT abdomen pelvis, CT head I independently visualized and interpreted imaging which showed  CT head: No acute intracranial normality CT angio chest PE/CTA abdomen pelvis: No evidence of PE.  3.1 cm lesion along inferior aspect of proximal pancreatic tail.  Thickened bladder wall with suspected ascending infection.  Right renal debris and collecting system. Chest x-ray: No acute cardiopulmonary maladies. I agree with the radiologist interpretation   Cardiac Monitoring: / EKG:  The patient was maintained on a cardiac monitor.  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  Denies illicit drug use.   Test / Admission - Considered:  Urosepsis, altered mentation, pancreatic lesion Vitals signs significant for initially hypotensive with blood pressure systolic in the 80s as well as tachycardic with heart rate in the 1 teens initially on exam with correction IV fluids and other medicines administered.. Otherwise within normal range and stable throughout visit. Laboratory/imaging studies significant for: See above 72 year old male presents emergency department with altered mentation.  Patient with history of MS with chronic indwelling Foley catheter.  Acute confusion reportedly began this morning with noticing cloudy urine  and catheter bag.  On exam initially, patient able to respond to person as well as place but with some mumbled speech.  No obvious acute focal neuro deficit on exam.  Patient initially with tachycardia with a heart rate in the 1 teens, hypotension with systolic of 80s, leukocytosis of 25.4 concerning for sepsis.  Upon initial presentation, sepsis protocol was performed with broad-spectrum antibiotics and 30 cc/kg fluid bolus administered given patient's initial hypotension.  UA appears to be with infection, CT imaging of patient's abdomen concerning for ascending urinary infection.  Suspect patient's cause of sepsis is most likely from urine.  Chest x-ray, CT chest without obvious pneumonia despite patient's worsening cough over the past couple of days.  CT imaging of head without any obvious stroke or other acute abnormality.  Given patient's altered mental status in the setting of urosepsis, will admit. Treatment plan were discussed at length with patient's family and they knowledge understanding was agreeable to said plan.  Appropriate consultations were made as described in the ED course.           Final Clinical Impression(s) / ED Diagnoses Final diagnoses:  Sepsis, due to unspecified organism, unspecified whether acute organ dysfunction present [facility])  Altered mental status, unspecified altered mental status type  Pancreatic mass    Rx / DC Orders ED Discharge Orders     None         Peter Garter, [place] 06/27/23 [id number]    Loetta Rough, MD 06/30/23 1613    Loetta Rough, MD 06/30/23 1615

## 2023-06-26 NOTE — Assessment & Plan Note (Addendum)
Hypotension in ED. -Hold olmesartan, Imdur

## 2023-06-26 NOTE — Progress Notes (Signed)
Pharmacy Antibiotic Note  Burt Narez is a 72 y.o. male admitted on 06/26/2023 with UTI.  Pharmacy has been consulted for cefepime dosing.  Plan: Cefepime 2g q12 hours Follow up repeat bmet and cultures  Height: 5\' 6"  (167.6 cm) Weight: 72.6 kg (160 lb) IBW/kg (Calculated) : 63.8  Temp (24hrs), Avg:98.4 F (36.9 C), Min:98.4 F (36.9 C), Max:98.4 F (36.9 C)  Recent Labs  Lab 06/26/23 1403  WBC 25.4*  CREATININE 1.86*  LATICACIDVEN 1.9    Estimated Creatinine Clearance: 31.9 mL/min (A) (by C-G formula based on SCr of 1.86 mg/dL (H)).    Allergies  Allergen Reactions   Farxiga [Dapagliflozin] Other (See Comments)    unable to take Marcelline Deist due to recurrent UTIs, urinary retention and hydronephrosis    Chauncey Mann [Finerenone] Other (See Comments)    hyperkalemia    Thank you for allowing pharmacy to be a part of this patient's care.  Sheppard Coil PharmD., BCPS Clinical Pharmacist 06/26/2023 3:50 PM

## 2023-06-26 NOTE — Assessment & Plan Note (Addendum)
Complicated by presence of chronic indwelling Foley catheter.  Rules in for sepsis with leukocytosis of 25.4, respiratory 16-23, blood pressure systolic down to 27O.  Improved with fluid bolus.  Lactic acid normal at 1.9  > 1.7.  Last urine cultures June and July this year grew beta-hemolytic strep, and Serratia marcescens-sensitive to cefepime ceftriaxone.   - IV Cefepime started in ED, continue -2.25 L bolus given, continue N/s 100cc/hr x 15hrs -Follow-up blood cultures

## 2023-06-26 NOTE — ED Notes (Signed)
Existing foley has been removed per provider order. Upon removal tip of catheter was curved. Assessment was done to assess what type of catheter was used. It was determined that a coude catheter was best plan. Rn has not received training on insertion of a coude. PA was consulted and it was decided he would place the catheter. Materials are at bedside.

## 2023-06-26 NOTE — ED Notes (Signed)
EDP is in room with RN assessing pt.

## 2023-06-26 NOTE — Assessment & Plan Note (Signed)
Sodium 127.  Hydrate with normal saline

## 2023-06-26 NOTE — ED Notes (Signed)
Entering the room, BP was low. Pt is not very arousable. Placed pt in trendelenburg and pressure came up to 105/74. Messaged MD with update.

## 2023-06-26 NOTE — Progress Notes (Unsigned)
Name: Jacob Fowler DOB: [birth year] MRN: [id number]  History of Present Illness: Mr. Beckum is a 72 y.o. male who presents today for follow up visit at [facility]. ***He is accompanied by ***. - GU History: 1. 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  - ***Taking Rapaflo 8 mg daily and Proscar 5 mg daily. - See PSA values below. 2. Recurrent UTls.  - ***Taking daily low dose Nitrofurantoin for UTI prophylaxis. - See urine culture data below. 3. Bladder stone. - ***Taking sodium bicarb and citrate solution for stone prevention as per Dr. Wolfgang Phoenix (nephrology).  4. Persistent bilateral hydronephrosis.  - 10/23/2022: Creatinine 2.15; GFR 32.  Recent history: > 05/22/2023: Renal ultrasound showed moderate right and mild left hydronephrosis.    At last visit with Dr. Ronne Binning on 05/28/2023: The plan was: 1. Nephrolithiasis: Follow up in 6 months with renal US.  2. Recurrent UTI: Urine for culture.  3. BPH with obstruction/lower urinary tract symptoms: Indwelling Foley catheter placed due to chronic retention with hydronephrosis. 4. Nocturia: Foley catheter placed. 5. Hydronephrosis: Follow up in 1 month with repeat renal US with Foley catheter in place.  Since last visit: > 06/04/2023: Renal ultrasound showed: 1. Fullness of the left renal pelvis without hydronephrosis. This has improved when compared to the prior study. 2. Suboptimal visualization of the right kidney due to overlying bowel gas.  Today: He reports hematuria and fever since ***.  He reports the catheter is draining ***.  He {Actions; denies-reports:120008} flank pain or abdominal pain. He {Actions; denies-reports:120008} nausea or vomiting.  Urine culture results in past 6 months: - 08/11/2022: Positive for Citrobacter freundii - 08/30/2022: Positive for Beta hemolytic Streptococcus, group B  - 09/20/2022: Positive for Beta hemolytic Streptococcus, group B  -  10/25/2022: Positive for Klebsiella pneumoniae - 11/09/2022: Negative - 12/25/2022: Positive for Serratia marcescens - 01/22/2023: Positive for Beta hemolytic Streptococcus, group B  - 03/14/2023: Negative   PSA values: - 08/07/2018: 4.4 - 12/25/2018: 5.1 - 04/25/2019: 5.0 - 08/27/2019: 4.4 - 12/25/2019: 4.8 - 10/08/2020: 5.3 - 10/27/2022: 1.2   Fall Screening: Do you usually have a device to assist in your mobility? Yes   Medications: Current Outpatient Medications  Medication Sig Dispense Refill   Ascorbic Acid (VITAMIN C PO) Take by mouth.     aspirin 81 MG EC tablet Take 81 mg by mouth daily.     Cholecalciferol (VITAMIN D) 50 MCG (2000 UT) tablet Take 2,000 Units by mouth daily.     cyanocobalamin (VITAMIN B12) 1000 MCG/ML injection INJECT INTO THE SKIN ONCE A MONTH 3 mL 5   ferrous sulfate 325 (65 FE) MG tablet Take 1 tablet (325 mg total) by mouth daily with breakfast. 90 tablet 1   finasteride (PROSCAR) 5 MG tablet Take 1 tablet (5 mg total) by mouth daily. 90 tablet 3   fluconazole (DIFLUCAN) 150 MG tablet Take 150 mg by mouth once a week.     glucose blood test strip Use as instructed 100 each 12   insulin glargine (LANTUS SOLOSTAR) 100 UNIT/ML Solostar Pen Inject 55 Units into the skin daily. 30 mL 11   Insulin Pen Needle 31G X 5 MM MISC Use with lantus pen as directed 100 each 2   isosorbide mononitrate (IMDUR) 60 MG 24 hr tablet TAKE 1/2 TABLET (30MG  TOTAL) BY MOUTH DAILY 45 tablet 1   ketoconazole (NIZORAL) 2 % cream Apply 1 Application topically 2 (two) times daily.  levothyroxine (SYNTHROID) 88 MCG tablet Take 1 tablet (88 mcg total) by mouth daily. 90 tablet 3   magnesium oxide (MAG-OX) 400 MG tablet Take 1 tablet by mouth daily.     Menthol-Zinc Oxide (CALMOSEPTINE) 0.44-20.6 % OINT Apply small amount o affected are BID 113 g 2   olmesartan (BENICAR) 5 MG tablet Take 1 tablet (5 mg total) by mouth daily. 90 tablet 1   omeprazole (PRILOSEC) 20 MG capsule  TAKE ONE CAPSULE (20MG  TOTAL) BY MOUTH DAILY 90 capsule 0   rosuvastatin (CRESTOR) 10 MG tablet Take 1 tablet (10 mg total) by mouth daily. 90 tablet 1   Semaglutide,0.25 or 0.5MG /DOS, (OZEMPIC, 0.25 OR 0.5 MG/DOSE,) 2 MG/3ML SOPN Inject 0.25mg  into skin weekly for 4 weeks, then increase to 0.5mg  weekly thereafter. DX:E11.65 3 mL 2   silodosin (RAPAFLO) 8 MG CAPS capsule Take 1 capsule (8 mg total) by mouth at bedtime. 90 capsule 3   sodium bicarbonate 650 MG tablet Take 650 mg by mouth 4 (four) times daily.     sodium citrate-citric acid (ORACIT) 500-334 MG/5ML solution Take 15 mLs by mouth 2 (two) times daily.     No current facility-administered medications for this visit.    Allergies: Allergies  Allergen Reactions   Farxiga [Dapagliflozin] Other (See Comments)    unable to take Farxiga due to recurrent UTIs, urinary retention and hydronephrosis    Chauncey Mann [Finerenone] Other (See Comments)    hyperkalemia    Past Medical History:  Diagnosis Date   Acquired leg length discrepancy    Bladder stone    BPH associated with nocturia    CAD (coronary artery disease) CARDIOLOGIST- DR HOCHREIN--  LAST VISIT 09-27-2011 IN EPIC   Non obstructive Cath 2010-  HX CORONARY SPASM 2004   Cataract    bilateral    GERD (gastroesophageal reflux disease)    H/O hiatal hernia    Heart murmur    Heartburn    High cholesterol    History of nephrolithiasis 2009   Hx of adenomatous colonic polyps    Hyperlipidemia    Hypertension    Movement disorder    Multiple sclerosis (HCC) DX  10/10---  NEUROLOGIST  DR Gerlene Burdock [place] ([place])   RIGHT SIDE AFFECTED MORE W/ WEAKNESS- - USES CANE   Nephrolithiasis 08/30/2020   Neuromuscular disorder (HCC)    MS   NIDDM (non-insulin dependent diabetes mellitus)    Peyronie's disease    RBBB    Renal stone RIGHT   Rosacea    Vision abnormalities    Past Surgical History:  Procedure Laterality Date   CARDIAC CATHETERIZATION  02-26-2009 ---  DR  Julien Nordmann   NONOBSTRUCTIVE CAD/ 50% DISTAL RCA/ 30% LAD / NORMAL LVF   CYSTOSCOPY W/ RETROGRADES  10/30/2011   Procedure: CYSTOSCOPY WITH RETROGRADE PYELOGRAM;  Surgeon: Garnett Farm, MD;  Location: [facility];  Service: Urology;  Laterality: Right;   CYSTOSCOPY WITH LITHOLAPAXY  10/30/2011   Procedure: CYSTOSCOPY WITH LITHOLAPAXY;  Surgeon: Garnett Farm, MD;  Location: [facility];  Service: Urology;  Laterality: N/A;   CYSTOSCOPY WITH RETROGRADE PYELOGRAM, URETEROSCOPY AND STENT PLACEMENT Left 11/25/2020   Procedure: CYSTOSCOPY WITH LEFT RETROGRADE PYELOGRAM, LEFT URETEROSCOPY WITH LASER AND LEFT URETERAL STENT PLACEMENT;  Surgeon: Malen Gauze, MD;  Location: [facility];  Service: Urology;  Laterality: Left;   EXTRACORPOREAL SHOCK WAVE LITHOTRIPSY  09/05/2010   RIGHT   EYE SURGERY Right 08/30/2018   HOLMIUM LASER APPLICATION Left  11/25/2020   Procedure: HOLMIUM LASER APPLICATION;  Surgeon: Malen Gauze, MD;  Location: [facility];  Service: Urology;  Laterality: Left;   NESBIT PROCEDURE  10/17/2004   CORRECTION OF PENILE ANGULATION (PEYRONIES DISEASE)   UTI  06/17/2022   Family History  Problem Relation Age of Onset   Stroke Mother    Heart attack Mother    Diabetes Mother    Hypertension Mother    Renal cancer Father    Diabetes Sister    Heart attack Brother    Diabetes Son    Heart attack Maternal Grandfather    Heart attack Brother    Bladder Cancer Brother    Social History   Socioeconomic History   Marital status: Married    Spouse name: Steward Drone   Number of children: 2   Years of education: 13   Highest education level: Some college, no degree  Occupational History   Occupation: retired    Comment: self -employed farmer  Tobacco Use   Smoking status: Former    Current packs/day: 0.00    Average packs/day: 1 pack/day for 7.0 years (7.0 ttl pk-yrs)    Types: Cigarettes    Start date: 09/27/1966    Quit date: 09/26/1973     Years since quitting: 49.7   Smokeless tobacco: Current    Types: Chew   Tobacco comments:    Chewing tobacco  Vaping Use   Vaping status: Never Used  Substance and Sexual Activity   Alcohol use: No    Alcohol/week: 0.0 standard drinks of alcohol   Drug use: No   Sexual activity: Not Currently  Other Topics Concern   Not on file  Social History Narrative   Lives with wife, has MS, mobility issues.   Social Determinants of Health   Financial Resource Strain: Low Risk  (11/23/2022)   Overall Financial Resource Strain (CARDIA)    Difficulty of Paying Living Expenses: Not hard at all  Food Insecurity: No Food Insecurity (11/23/2022)   Hunger Vital Sign    Worried About Running Out of Food in the Last Year: Never true    Ran Out of Food in the Last Year: Never true  Transportation Needs: No Transportation Needs (11/23/2022)   PRAPARE - Administrator, Civil Service (Medical): No    Lack of Transportation (Non-Medical): No  Physical Activity: Insufficiently Active (11/23/2022)   Exercise Vital Sign    Days of Exercise per Week: 1 day    Minutes of Exercise per Session: 10 min  Stress: No Stress Concern Present (11/23/2022)   [employer] of Occupational Health - Occupational Stress Questionnaire    Feeling of Stress : Not at all  Social Connections: Moderately Integrated (11/23/2022)   Social Connection and Isolation Panel [NHANES]    Frequency of Communication with Friends and Family: More than three times a week    Frequency of Social Gatherings with Friends and Family: More than three times a week    Attends Religious Services: More than 4 times per year    Active Member of [employer] or Organizations: No    Attends Banker Meetings: Never    Marital Status: Married  Catering manager Violence: Not At Risk (11/23/2022)   Humiliation, Afraid, Rape, and Kick questionnaire    Fear of Current or Ex-Partner: No    Emotionally Abused: No    Physically Abused: No     Sexually Abused: No    SUBJECTIVE  Review of Systems Constitutional: Patient denies  any unintentional weight loss or change in strength lntegumentary: Patient denies any rashes or pruritus Cardiovascular: Patient denies chest pain or syncope Respiratory: Patient denies shortness of breath Gastrointestinal: Patient ***denies nausea, vomiting, constipation, or diarrhea Musculoskeletal: Patient denies muscle cramps or weakness Neurologic: Patient denies convulsions or seizures Allergic/Immunologic: Patient denies recent allergic reaction(s) Hematologic/Lymphatic: Patient denies bleeding tendencies Endocrine: Patient denies heat/cold intolerance  GU: As per HPI.  OBJECTIVE There were no vitals filed for this visit. There is no height or weight on file to calculate BMI.  Physical Examination*** Constitutional: No obvious distress; patient is non-toxic appearing  Cardiovascular: No visible lower extremity edema.  Respiratory: The patient does not have audible wheezing/stridor; respirations do not appear labored  Gastrointestinal: Abdomen non-distended Musculoskeletal: Normal ROM of UEs  Skin: No obvious rashes/open sores  Neurologic: CN 2-12 grossly intact Psychiatric: Answered questions appropriately with normal affect  Hematologic/Lymphatic/Immunologic: No obvious bruises or sites of spontaneous bleeding  GU: Catheter draining ***clear yellow urine.  ASSESSMENT UTI symptoms  Hematuria, unspecified type  Recurrent UTI  Foley catheter in place  BPH with obstruction/lower urinary tract symptoms  Bilateral hydronephrosis  We reviewed recent imaging studies; hydronephrosis improved with Foley catheter in place.   For acute UTI symptoms in the setting of indwelling catheter and history of recurrent UTIs will treat empirically with ***Bactrim, which he has done well with previously.   Will plan for follow up as previously scheduled with Dr. Ronne Binning on 07/04/2023 or  sooner if needed. Pt verbalized understanding and agreement. All questions were answered.   PLAN Advised the following: Bactrim DS 2x/day x7 days.*** No follow-ups on file.  No orders of the defined types were placed in this encounter.   It has been explained that the patient is to follow regularly with their PCP in addition to all other providers involved in their care and to follow instructions provided by these respective offices. Patient advised to contact urology clinic if any urologic-pertaining questions, concerns, new symptoms or problems arise in the interim period.  There are no Patient Instructions on file for this visit.  Electronically signed by:  Donnita Falls, MSN, FNP-C, CUNP 06/26/2023 11:17 AM

## 2023-06-26 NOTE — Sepsis Progress Note (Signed)
Elink monitoring for the code sepsis protocol.  

## 2023-06-26 NOTE — ED Notes (Signed)
Waiting for provider to reinsert foley

## 2023-06-26 NOTE — Assessment & Plan Note (Addendum)
Likely secondary to severe sepsis.  Head CT without acute abnormality.  Baseline AMS, per family no memory deficits.  Currently awake, lethargic, following directions.  Scrotum also appears erythematous, with mild tenderness and mild firmness on exam to right scrotum.  CT shows unremarkable prostate -Obtain ultrasound of scrotum -IV antibiotics IV fluids

## 2023-06-26 NOTE — Assessment & Plan Note (Signed)
-   SSI- M -Hold home Lantus, semaglutide for now

## 2023-06-26 NOTE — ED Notes (Signed)
2nd urinalysis delayed due to lab crediting the order assuming it was a duplicate order. RN explained the order was needed and asked the provider for it to be reordered.

## 2023-06-26 NOTE — ED Notes (Signed)
Pt hypotensive, IV started, pt placed in trendelenberg

## 2023-06-26 NOTE — ED Triage Notes (Signed)
Daughter states pt has been more lethargic the last couple of days; pt has a urinary catheter and the urine in bag is green in color and thick; daughter states she noticed some blood in his bag a couple of days ago

## 2023-06-26 NOTE — Assessment & Plan Note (Deleted)
Indwelling chronic Foley catheter.

## 2023-06-27 ENCOUNTER — Inpatient Hospital Stay (HOSPITAL_COMMUNITY): Payer: PPO

## 2023-06-27 DIAGNOSIS — N453 Epididymo-orchitis: Secondary | ICD-10-CM | POA: Diagnosis not present

## 2023-06-27 DIAGNOSIS — K8689 Other specified diseases of pancreas: Secondary | ICD-10-CM | POA: Diagnosis present

## 2023-06-27 DIAGNOSIS — K869 Disease of pancreas, unspecified: Secondary | ICD-10-CM | POA: Diagnosis present

## 2023-06-27 LAB — BASIC METABOLIC PANEL
Anion gap: 9 (ref 5–15)
BUN: 19 mg/dL (ref 8–23)
CO2: 20 mmol/L — ABNORMAL LOW (ref 22–32)
Calcium: 8.8 mg/dL — ABNORMAL LOW (ref 8.9–10.3)
Chloride: 107 mmol/L (ref 98–111)
Creatinine, Ser: 1.55 mg/dL — ABNORMAL HIGH (ref 0.61–1.24)
GFR, Estimated: 47 mL/min — ABNORMAL LOW (ref >60)
Glucose, Bld: 84 mg/dL (ref 70–99)
Potassium: 3.5 mmol/L (ref 3.5–5.1)
Sodium: 136 mmol/L (ref 135–145)

## 2023-06-27 LAB — GLUCOSE, CAPILLARY
Glucose-Capillary: 202 mg/dL — ABNORMAL HIGH (ref 70–99)
Glucose-Capillary: 217 mg/dL — ABNORMAL HIGH (ref 70–99)
Glucose-Capillary: 217 mg/dL — ABNORMAL HIGH (ref 70–99)
Glucose-Capillary: 222 mg/dL — ABNORMAL HIGH (ref 70–99)
Glucose-Capillary: 71 mg/dL (ref 70–99)
Glucose-Capillary: 73 mg/dL (ref 70–99)
Glucose-Capillary: 73 mg/dL (ref 70–99)

## 2023-06-27 LAB — CBC
HCT: 30.5 % — ABNORMAL LOW (ref 39.0–52.0)
Hemoglobin: 9.5 g/dL — ABNORMAL LOW (ref 13.0–17.0)
MCH: 26.1 pg (ref 26.0–34.0)
MCHC: 31.1 g/dL (ref 30.0–36.0)
MCV: 83.8 fL (ref 80.0–100.0)
Platelets: 167 10*3/uL (ref 150–400)
RBC: 3.64 MIL/uL — ABNORMAL LOW (ref 4.22–5.81)
RDW: 15.6 % — ABNORMAL HIGH (ref 11.5–15.5)
WBC: 14.7 10*3/uL — ABNORMAL HIGH (ref 4.0–10.5)
nRBC: 0 % (ref 0.0–0.2)

## 2023-06-27 MED ORDER — DOXYCYCLINE HYCLATE 100 MG PO TABS
100.0000 mg | ORAL_TABLET | Freq: Two times a day (BID) | ORAL | Status: DC
  Administered 2023-06-27 – 2023-06-29 (×4): 100 mg via ORAL
  Filled 2023-06-27 (×4): qty 1

## 2023-06-27 MED ORDER — THERAHONEY EX GEL
1.0000 | Freq: Every day | CUTANEOUS | Status: DC
  Administered 2023-06-27 – 2023-06-29 (×3): 1 via TOPICAL
  Filled 2023-06-27: qty 42.5

## 2023-06-27 MED ORDER — CHLORHEXIDINE GLUCONATE CLOTH 2 % EX PADS
6.0000 | MEDICATED_PAD | Freq: Every day | CUTANEOUS | Status: DC
  Administered 2023-06-27 – 2023-06-29 (×3): 6 via TOPICAL

## 2023-06-27 MED ORDER — INSULIN GLARGINE-YFGN 100 UNIT/ML ~~LOC~~ SOLN
30.0000 [IU] | Freq: Every day | SUBCUTANEOUS | Status: DC
Start: 2023-06-27 — End: 2023-06-29
  Administered 2023-06-27 – 2023-06-28 (×2): 30 [IU] via SUBCUTANEOUS
  Filled 2023-06-27 (×3): qty 0.3

## 2023-06-27 MED ORDER — GERHARDT'S BUTT CREAM
TOPICAL_CREAM | Freq: Two times a day (BID) | CUTANEOUS | Status: DC
  Administered 2023-06-28: 1 via TOPICAL
  Filled 2023-06-27: qty 60

## 2023-06-27 NOTE — Progress Notes (Signed)
PROGRESS NOTE   Jacob Fowler, is a 72 y.o. male, DOB - September 01, 1950, MVH:[id number]  Admit date - 06/26/2023   Admitting Physician Onnie Boer, MD  Outpatient Primary MD for the patient is Bennie Pierini, FNP  LOS - 1  Chief Complaint  Patient presents with   Altered Mental Status        Brief Narrative:  72 y.o. male with medical history significant for multiple sclerosis, BPH, diabetes mellitus, hypertension, hypothyroidism, chronic indwelling Foley catheter admitted on 06/26/2023 with right-sided epididymoorchitis and catheter associated UTI--POA    -Assessment and Plan: 1) sepsis secondary to acute right-sided epididymoorchitis --- ultrasound findings noted without torsion or other more concerning findings -Continue IV cefepime -Add doxycycline WBC 25.4 >>14.7 -Curbside urologist  2) sepsis secondary to complicated UTI /CAUTI---POA - Last urine cultures June and July this year grew beta-hemolytic strep, and Serratia marcescens-sensitive to cefepime ceftriaxone.   -Resuscitated adequately with IV fluids -Antibiotics as above #1  3)Acute metabolic encephalopathy-due to #1 and #2 above -  Head CT without acute abnormality.   -Mentation improvement with treatment as above #1 and # 2  4)Pancreatic Lesion-----?? Pancreatic Malignancy 3.1 cm lesion along the inferior aspect of the proximal pancreatic tail, new from prior, raising concern for pancreatic adenocarcinoma. Focal pancreatitis is considered less likely. Consider EUS for tissue sampling. When patient is stable (without concerns of motion degradation),  -short-term follow-up MRI abdomen with/without contrast is also suggested patient and if MRI/MRCP findings are concerning patient will need EGD with EUS for tissue sampling  5)Hyponatremia -Sodium normalized with hydration  6)MS (multiple sclerosis) (HCC) Ambulates with wheelchair.  Weakness to right lower extremity, phonic indwelling Foley  catheter.  Lives at home with family.  7)Hypertension associated with type 2 diabetes mellitus (HCC) -Hold olmesartan, Imdur due to soft BP  8)Hyperlipidemia associated with type 2 diabetes mellitus (HCC) -Hold Ozempic -Restart Lantus Use Novolog/Humalog Sliding scale insulin with Accu-Cheks/Fingersticks as ordered   9) BPH--- okay to continue Proscar but hold Rapaflo   Status is: Inpatient   Disposition: The patient is from: Home              Anticipated d/c is to: Home              Anticipated d/c date is: 2 days              Patient currently is not medically stable to d/c. Barriers: Not Clinically Stable-   Code Status :  -  Code Status: Full Code   Family Communication:  (patient is alert, awake and coherent)  Daughter Jacob Fowler and wife at bedside  DVT Prophylaxis  :   - SCDs   enoxaparin (LOVENOX) injection 40 mg Start: 06/27/23 1000   Lab Results  Component Value Date   PLT 167 06/27/2023    Inpatient Medications  Scheduled Meds:  aspirin EC  81 mg Oral Daily   Chlorhexidine Gluconate Cloth  6 each Topical Daily   doxycycline  100 mg Oral Q12H   enoxaparin (LOVENOX) injection  40 mg Subcutaneous Q24H   finasteride  5 mg Oral Daily   Jacob's butt cream   Topical BID   insulin aspart  0-15 Units Subcutaneous Q4H   leptosperum manuka honey  1 Application Topical Daily   levothyroxine  88 mcg Oral Q0600   pantoprazole  40 mg Oral Daily   Continuous Infusions:  ceFEPime (MAXIPIME) IV 2 g (06/27/23 1701)   PRN Meds:.acetaminophen **OR** acetaminophen, ondansetron **OR** ondansetron (ZOFRAN) IV,  polyethylene glycol   Anti-infectives (From admission, onward)    Start     Dose/Rate Route Frequency Ordered Stop   06/27/23 2200  doxycycline (VIBRA-TABS) tablet 100 mg        100 mg Oral Every 12 hours 06/27/23 1737     06/27/23 0400  ceFEPIme (MAXIPIME) 2 g in sodium chloride 0.9 % 100 mL IVPB        2 g 200 mL/hr over 30 Minutes Intravenous Every 12 hours  06/26/23 1550 07/04/23 0359   06/26/23 1430  ceFEPIme (MAXIPIME) 2 g in sodium chloride 0.9 % 100 mL IVPB        2 g 200 mL/hr over 30 Minutes Intravenous  Once 06/26/23 1423 06/26/23 1604      Subjective: Jacob Fowler today has no fevers, no emesis,  No chest pain,   Daughter Jacob Fowler and wife at bedside - Much improved mentation -Eating and drinking well   Objective: Vitals:   06/27/23 1000 06/27/23 1100 06/27/23 1153 06/27/23 1226  BP: (!) 113/56 123/66  135/74  Pulse: 84 81  77  Resp: 19 20  16   Temp:   98.1 F (36.7 C) 97.8 F (36.6 C)  TempSrc:   Oral   SpO2: 97% 97%  99%  Weight:      Height:        Intake/Output Summary (Last 24 hours) at 06/27/2023 1737 Last data filed at 06/27/2023 1121 Gross per 24 hour  Intake 3414.68 ml  Output 3150 ml  Net 264.68 ml   Filed Weights   06/26/23 1328 06/26/23 2230 06/27/23 0600  Weight: 72.6 kg 72.1 kg 75.7 kg    Physical Exam  Gen:- Awake Alert,  in no apparent distress  HEENT:- [place].AT, No sclera icterus Neck-Supple Neck,No JVD,.  Lungs-  CTAB , fair symmetrical air movement CV- S1, S2 normal, regular  Abd-  +ve B.Sounds, Abd Soft, No tenderness, no CVA area tenderness Extremity/Skin:- No  edema, pedal pulses present  Psych-affect is appropriate, oriented x3 Neuro-chronic neuromuscular deficits consistent with underlying multiple sclerosis, no additional new focal deficits, no tremors GU-Foley catheter with less cloudy urine  Data Reviewed: I have personally reviewed following labs and imaging studies  CBC: Recent Labs  Lab 06/26/23 1403 06/27/23 0258  WBC 25.4* 14.7*  NEUTROABS 21.7*  --   HGB 11.4* 9.5*  HCT 35.6* 30.5*  MCV 83.2 83.8  PLT 239 167   Basic Metabolic Panel: Recent Labs  Lab 06/26/23 1403 06/27/23 0258  NA 127* 136  K 4.0 3.5  CL 92* 107  CO2 22 20*  GLUCOSE 208* 84  BUN 23 19  CREATININE 1.86* 1.55*  CALCIUM 9.4 8.8*   GFR: Estimated Creatinine Clearance: 38.3 mL/min (A)  (by C-G formula based on SCr of 1.55 mg/dL (H)). Liver Function Tests: Recent Labs  Lab 06/26/23 1403  AST 27  ALT 24  ALKPHOS 149*  BILITOT 0.8  PROT 7.7  ALBUMIN 3.5   Recent Results (from the past 240 hour(s))  Urine Culture     Status: None (Preliminary result)   Collection Time: 06/26/23  2:00 PM   Specimen: Urine, Clean Catch  Result Value Ref Range Status   Specimen Description   Final    URINE, CLEAN CATCH Performed at [facility], [address], [place], [place] [address]    Special Requests   Final    NONE Reflexed from [id number] Performed at [facility], [address] [place], [place] [address]  Culture PENDING  Incomplete   Report Status PENDING  Incomplete  Blood Culture (routine x 2)     Status: None (Preliminary result)   Collection Time: 06/26/23  2:03 PM   Specimen: BLOOD  Result Value Ref Range Status   Specimen Description BLOOD RIGHT ASSIST CONTROL  Final   Special Requests   Final    BOTTLES DRAWN AEROBIC AND ANAEROBIC Blood Culture adequate volume   Culture  Setup Time   Final    AEROBIC BOTTLE ONLY GRAM POSITIVE COCCI Gram Stain Report Called to,Read Back By and Verified With: JUDY BELL @1640  ON 06/27/23 C VARNER Performed at [facility], [address], [place], [place] [address]    Culture GRAM POSITIVE COCCI  Final   Report Status PENDING  Incomplete  Blood Culture (routine x 2)     Status: None (Preliminary result)   Collection Time: 06/26/23  2:53 PM   Specimen: BLOOD  Result Value Ref Range Status   Specimen Description BLOOD rt wrist  Final   Special Requests   Final    BOTTLES DRAWN AEROBIC AND ANAEROBIC Blood Culture adequate volume   Culture   Final    NO GROWTH < 24 HOURS Performed at [facility], [address], [place], [place] [address]    Report Status PENDING  Incomplete  Resp panel by RT-PCR (RSV, Flu A&B, Covid)     Status: None   Collection Time: 06/26/23  3:50 PM  Result Value Ref Range Status   SARS  Coronavirus 2 by RT PCR NEGATIVE NEGATIVE Final    Comment: (NOTE) SARS-CoV-2 target nucleic acids are NOT DETECTED.  The SARS-CoV-2 RNA is generally detectable in upper respiratory specimens during the acute phase of infection. The lowest concentration of SARS-CoV-2 viral copies this assay can detect is 138 copies/mL. A negative result does not preclude SARS-Cov-2 infection and should not be used as the sole basis for treatment or other patient management decisions. A negative result may occur with  improper specimen collection/handling, submission of specimen other than nasopharyngeal swab, presence of viral mutation(s) within the areas targeted by this assay, and inadequate number of viral copies(<138 copies/mL). A negative result must be combined with clinical observations, patient history, and epidemiological information. The expected result is Negative.  Fact Sheet for Patients:  BloggerCourse.com  Fact Sheet for Healthcare Providers:  SeriousBroker.it  This test is no t yet approved or cleared by the [place] FDA and  has been authorized for detection and/or diagnosis of SARS-CoV-2 by FDA under an Emergency Use Authorization (EUA). This EUA will remain  in effect (meaning this test can be used) for the duration of the COVID-19 declaration under Section 564(b)(1) of the Act, 21 U.S.C.section 360bbb-3(b)(1), unless the authorization is terminated  or revoked sooner.       Influenza A by PCR NEGATIVE NEGATIVE Final   Influenza B by PCR NEGATIVE NEGATIVE Final    Comment: (NOTE) The Xpert Xpress SARS-CoV-2/FLU/RSV plus assay is intended as an aid in the diagnosis of influenza from Nasopharyngeal swab specimens and should not be used as a sole basis for treatment. Nasal washings and aspirates are unacceptable for Xpert Xpress SARS-CoV-2/FLU/RSV testing.  Fact Sheet for  Patients: BloggerCourse.com  Fact Sheet for Healthcare Providers: SeriousBroker.it  This test is not yet approved or cleared by the [place] FDA and has been authorized for detection and/or diagnosis of SARS-CoV-2 by FDA under an Emergency Use Authorization (EUA). This EUA will remain in effect (meaning this test  can be used) for the duration of the COVID-19 declaration under Section 564(b)(1) of the Act, 21 U.S.C. section 360bbb-3(b)(1), unless the authorization is terminated or revoked.     Resp Syncytial Virus by PCR NEGATIVE NEGATIVE Final    Comment: (NOTE) Fact Sheet for Patients: BloggerCourse.com  Fact Sheet for Healthcare Providers: SeriousBroker.it  This test is not yet approved or cleared by the [place] FDA and has been authorized for detection and/or diagnosis of SARS-CoV-2 by FDA under an Emergency Use Authorization (EUA). This EUA will remain in effect (meaning this test can be used) for the duration of the COVID-19 declaration under Section 564(b)(1) of the Act, 21 U.S.C. section 360bbb-3(b)(1), unless the authorization is terminated or revoked.  Performed at [facility], [address] [place], [place] [address]   Urine Culture     Status: None (Preliminary result)   Collection Time: 06/26/23  5:12 PM   Specimen: Urine, Catheterized  Result Value Ref Range Status   Specimen Description   Final    URINE, CATHETERIZED Performed at [facility], [address], [place], [place] [address]    Special Requests   Final    NONE Reflexed from [id number] Performed at [facility], [address], [place], [place] [address]    Culture PENDING  Incomplete   Report Status PENDING  Incomplete  MRSA Next Gen by PCR, Nasal     Status: None   Collection Time: 06/26/23 10:14 PM   Specimen: Nasal Mucosa; Nasal Swab  Result Value Ref Range Status   MRSA by PCR  Next Gen NOT DETECTED NOT DETECTED Final    Comment: (NOTE) The GeneXpert MRSA Assay (FDA approved for NASAL specimens only), is one component of a comprehensive MRSA colonization surveillance program. It is not intended to diagnose MRSA infection nor to guide or monitor treatment for MRSA infections. Test performance is not FDA approved in patients less than 63 years old. Performed at [facility], [address] [place], [place] [address]     Radiology Studies: US SCROTUM W/DOPPLER  Result Date: 06/27/2023 CLINICAL DATA:  Pain EXAM: SCROTAL ULTRASOUND DOPPLER ULTRASOUND OF THE TESTICLES TECHNIQUE: Complete ultrasound examination of the testicles, epididymis, and other scrotal structures was performed. Color and spectral Doppler ultrasound were also utilized to evaluate blood flow to the testicles. COMPARISON:  11/30/2020 FINDINGS: Right testicle Measurements: 3.8 x 2.7 x 2.8 cm. Hypervascularity. No mass or microlithiasis visualized. Left testicle Measurements: 2.9 x 1.6 x 2.6 cm. No mass or microlithiasis visualized. Right epididymis: Enlarged and heterogeneous with increased vascularity. Left epididymis:  Normal in size and appearance. Hydrocele: Small-moderate-sized complex right hydrocele with numerous internal septations. Small simple appearing left-sided hydrocele. Varicocele:  None visualized. Scrotal wall: Right greater than left skin thickening. Right-sided scrotal wall hyperemia. No organized fluid collections. Pulsed Doppler interrogation of both testes demonstrates normal low resistance arterial and venous waveforms bilaterally. IMPRESSION: 1. Findings compatible with acute right-sided epididymoorchitis. 2. Small-moderate-sized complex right hydrocele with numerous internal septations. 3. Right greater than left scrotal wall thickening and hyperemia concerning for cellulitis. No organized fluid collections. 4. Small simple appearing left-sided hydrocele. Electronically Signed   By:  Duanne Guess D.O.   On: 06/27/2023 14:24   CT Angio Chest PE W/Cm &/Or Wo Cm  Result Date: 06/26/2023 CLINICAL DATA:  Abdominal pain, lethargy EXAM: CT ANGIOGRAPHY CHEST CT ABDOMEN AND PELVIS WITH CONTRAST TECHNIQUE: Multidetector CT imaging of the chest was performed using the standard protocol during bolus administration of intravenous contrast.  Multiplanar CT image reconstructions and MIPs were obtained to evaluate the vascular anatomy. Multidetector CT imaging of the abdomen and pelvis was performed using the standard protocol during bolus administration of intravenous contrast. RADIATION DOSE REDUCTION: This exam was performed according to the departmental dose-optimization program which includes automated exposure control, adjustment of the mA and/or kV according to patient size and/or use of iterative reconstruction technique. CONTRAST:  80mL OMNIPAQUE IOHEXOL 350 MG/ML SOLN COMPARISON:  CT abdomen/pelvis dated 01/01/2023 FINDINGS: CTA CHEST FINDINGS Cardiovascular: Status are opacification of the bilateral pulmonary arteries to the segmental level. No evidence of pulmonary embolism. Although not tailored for evaluation of the thoracic aorta, there is no evidence of thoracic aortic aneurysm or dissection. The heart is normal in size.  No pericardial effusion. Severe three-vessel coronary atherosclerosis. Mediastinum/Nodes: No suspicious mediastinal adenopathy. Visualized thyroid is unremarkable. Lungs/Pleura: Dependent atelectasis in the bilateral upper and lower lobes. No focal consolidation. No suspicious pulmonary nodules. No pleural effusion or pneumothorax. Musculoskeletal: Visualized osseous structures are within normal limits. Review of the MIP images confirms the above findings. CT ABDOMEN and PELVIS FINDINGS Hepatobiliary: Liver is within normal limits. Gallbladder is unremarkable. No intrahepatic or extrahepatic duct dilatation. Pancreas: Lesion along the inferior aspect of the proximal  pancreatic tail, measuring at least 2.5 x 3.1 cm (series 4/image 29), new from prior, with suspected mild parenchymal atrophy of the distal pancreatic tail (series 4/image 31). Although poorly visualized/evaluated, this raises concern for pancreatic adenocarcinoma. Focal pancreatitis is considered less likely. Spleen: Within normal limits. Adrenals/Urinary Tract: Adrenal glands are within normal limits. Kidneys are within normal limits. Mild to moderate right hydronephrosis with urothelial thickening. Mild left hydronephrosis with urothelial thickening. No renal calculi. On delayed imaging, there is an apparent 4 defect in the right renal collecting system (series 8/image 17), although without associated enhancing lesion on the arterial phase, possibly reflecting debris. Thick-walled bladder with indwelling Foley catheter and mild perivesical stranding (series 4/image 85), suggesting cystitis. Stomach/Bowel: Stomach is notable for a small hiatal hernia. No evidence of bowel obstruction. Normal appendix (series 4/image 48). No colonic wall thickening or inflammatory changes. Vascular/Lymphatic: No evidence of abdominal aortic aneurysm. Atherosclerotic calcifications of the abdominal aorta and branch vessels, although vessels remain patent. Celiac axis abuts the suspected lesion. No suspicious abdominopelvic lymphadenopathy. Reproductive: Prostate is unremarkable. Other: No abdominopelvic ascites. Musculoskeletal: Degenerative changes of the lumbar spine. Review of the MIP images confirms the above findings. IMPRESSION: No evidence of pulmonary embolism. 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 Focal pancreatitis is considered less likely. Consider EUS for tissue sampling. When patient is stable (without concerns of motion degradation), short-term follow-up MRI abdomen with/without contrast is also suggested, as clinically  warranted. Thick-walled bladder with indwelling Foley catheter and mild perivesical stranding, suggesting cystitis. Suspected ascending infection involving the bilateral proximal collecting systems. Possible filling defect in the right renal collecting system on delayed imaging raises concern for debris. Additional ancillary findings as above. Electronically Signed   By: Charline Bills M.D.   On: 06/26/2023 17:38   CT ABDOMEN PELVIS W CONTRAST  Result Date: 06/26/2023 CLINICAL DATA:  Abdominal pain, lethargy EXAM: CT ANGIOGRAPHY CHEST CT ABDOMEN AND PELVIS WITH CONTRAST TECHNIQUE: Multidetector CT imaging of the chest was performed using the standard protocol during bolus administration of intravenous contrast. Multiplanar CT image reconstructions and MIPs were obtained to evaluate the vascular anatomy. Multidetector CT imaging of the abdomen and pelvis was performed  using the standard protocol during bolus administration of intravenous contrast. RADIATION DOSE REDUCTION: This exam was performed according to the departmental dose-optimization program which includes automated exposure control, adjustment of the mA and/or kV according to patient size and/or use of iterative reconstruction technique. CONTRAST:  80mL OMNIPAQUE IOHEXOL 350 MG/ML SOLN COMPARISON:  CT abdomen/pelvis dated 01/01/2023 FINDINGS: CTA CHEST FINDINGS Cardiovascular: Status are opacification of the bilateral pulmonary arteries to the segmental level. No evidence of pulmonary embolism. Although not tailored for evaluation of the thoracic aorta, there is no evidence of thoracic aortic aneurysm or dissection. The heart is normal in size.  No pericardial effusion. Severe three-vessel coronary atherosclerosis. Mediastinum/Nodes: No suspicious mediastinal adenopathy. Visualized thyroid is unremarkable. Lungs/Pleura: Dependent atelectasis in the bilateral upper and lower lobes. No focal consolidation. No suspicious pulmonary nodules. No pleural  effusion or pneumothorax. Musculoskeletal: Visualized osseous structures are within normal limits. Review of the MIP images confirms the above findings. CT ABDOMEN and PELVIS FINDINGS Hepatobiliary: Liver is within normal limits. Gallbladder is unremarkable. No intrahepatic or extrahepatic duct dilatation. Pancreas: Lesion along the inferior aspect of the proximal pancreatic tail, measuring at least 2.5 x 3.1 cm (series 4/image 29), new from prior, with suspected mild parenchymal atrophy of the distal pancreatic tail (series 4/image 31). Although poorly visualized/evaluated, this raises concern for pancreatic adenocarcinoma. Focal pancreatitis is considered less likely. Spleen: Within normal limits. Adrenals/Urinary Tract: Adrenal glands are within normal limits. Kidneys are within normal limits. Mild to moderate right hydronephrosis with urothelial thickening. Mild left hydronephrosis with urothelial thickening. No renal calculi. On delayed imaging, there is an apparent 4 defect in the right renal collecting system (series 8/image 17), although without associated enhancing lesion on the arterial phase, possibly reflecting debris. Thick-walled bladder with indwelling Foley catheter and mild perivesical stranding (series 4/image 85), suggesting cystitis. Stomach/Bowel: Stomach is notable for a small hiatal hernia. No evidence of bowel obstruction. Normal appendix (series 4/image 48). No colonic wall thickening or inflammatory changes. Vascular/Lymphatic: No evidence of abdominal aortic aneurysm. Atherosclerotic calcifications of the abdominal aorta and branch vessels, although vessels remain patent. Celiac axis abuts the suspected lesion. No suspicious abdominopelvic lymphadenopathy. Reproductive: Prostate is unremarkable. Other: No abdominopelvic ascites. Musculoskeletal: Degenerative changes of the lumbar spine. Review of the MIP images confirms the above findings. IMPRESSION: No evidence of pulmonary embolism. 3.1  cm lesion along the inferior aspect of the proximal pancreatic tail, new from prior, raising concern for pancreatic adenocarcinoma. Focal pancreatitis is considered less likely. Consider EUS for tissue sampling. When patient is stable (without concerns of motion degradation), short-term follow-up MRI abdomen with/without contrast is also suggested, as clinically warranted. Thick-walled bladder with indwelling Foley catheter and mild perivesical stranding, suggesting cystitis. Suspected ascending infection involving the bilateral proximal collecting systems. Possible filling defect in the right renal collecting system on delayed imaging raises concern for debris. Additional ancillary findings as above. Electronically Signed   By: Charline Bills M.D.   On: 06/26/2023 17:38   CT Head Wo Contrast  Result Date: 06/26/2023 CLINICAL DATA:  Mental status change, unknown cause EXAM: CT HEAD WITHOUT CONTRAST TECHNIQUE: Contiguous axial images were obtained from the base of the skull through the vertex without intravenous contrast. RADIATION DOSE REDUCTION: This exam was performed according to the departmental dose-optimization program which includes automated exposure control, adjustment of the mA and/or kV according to patient size and/or use of iterative reconstruction technique. COMPARISON:  CT Head 01/07/22 FINDINGS: Brain: No hemorrhage. No hydrocephalus. No extra-axial fluid collection. No CT  evidence of an acute cortical infarct. No mass effect. No mass lesion. Vascular: No hyperdense vessel or unexpected calcification. Skull: Mild soft tissue swelling along the posterior scalp. Negative for fracture or focal lesion. Sinuses/Orbits: No middle ear or mastoid effusion. Paranasal sinuses are notable for mucosal thickening in the bilateral maxillary sinuses. Bilateral lens replacement. Orbits are otherwise unremarkable. Other: None. IMPRESSION: No acute intracranial abnormality. Electronically Signed   By: Lorenza Cambridge M.D.   On: 06/26/2023 16:42   DG Chest 2 View  Result Date: 06/26/2023 CLINICAL DATA:  Lethargy. Altered mental status. Urinary tract infection. EXAM: CHEST - 2 VIEW COMPARISON:  01/07/2022 FINDINGS: Low lung volumes are again seen. Heart size is stable. No evidence of pulmonary infiltrate or pleural effusion. IMPRESSION: Low lung volumes. No active cardiopulmonary disease. Electronically Signed   By: Danae Orleans M.D.   On: 06/26/2023 15:19    Scheduled Meds:  aspirin EC  81 mg Oral Daily   Chlorhexidine Gluconate Cloth  6 each Topical Daily   doxycycline  100 mg Oral Q12H   enoxaparin (LOVENOX) injection  40 mg Subcutaneous Q24H   finasteride  5 mg Oral Daily   Jacob's butt cream   Topical BID   insulin aspart  0-15 Units Subcutaneous Q4H   leptosperum manuka honey  1 Application Topical Daily   levothyroxine  88 mcg Oral Q0600   pantoprazole  40 mg Oral Daily   Continuous Infusions:  ceFEPime (MAXIPIME) IV 2 g (06/27/23 1701)    LOS: 1 day   Shon Hale M.D on 06/27/2023 at 5:37 PM  Go to [email or web address] - for contact info  [facility] - Office  [phone]  If 7PM-7AM, please contact night-coverage [email or web address] 06/27/2023, 5:37 PM

## 2023-06-27 NOTE — Consult Note (Addendum)
WOC Nurse Consult Note: this consult performed remotely after review of EMR including photo documentation  Reason for Consult: sacral wound  Wound type: 1.  Unstageable Pressure Injury sacrum and L and R medial buttocks 2.  Stage 2 Pressure Injury R lateral buttock  3. Moisture Associated Skin damage to buttocks/sacrum/posterior thighs and scrotum   Pressure Injury POA: Yes Measurement: see nursing flowsheet  Wound bed: yellow necrotic tissue noted to sacral PI, evolving eschar noted to B medial buttock pressure injuries; R lateral buttock pink and moist  Drainage (amount, consistency, odor) per nursing flowsheet, none noted in photo  Periwound: erythema, scattered partial thickness skin loss r/t moisture and friction  Dressing procedure/placement/frequency: Clean Unstageable PI to sacrum/B medial buttocks with NS, apply Medihoney to wound bes daily and cover with dry gauze. Coat surrounding skin including buttocks/sacrum/posterior thighs and scrotum with Gerhard'ts Butt Cream 2 times daily.  May cover with silicone foam or ABD pad whichever is preferred.   POC discussed with bedside nurse. WOC team will not follow. Re-consult if further needs arise.   Thank you,    Priscella Mann MSN, RN-BC, [employer] [phone]

## 2023-06-27 NOTE — Plan of Care (Signed)

## 2023-06-27 NOTE — Progress Notes (Signed)
   06/27/23 0944  TOC Brief Assessment  Insurance and Status Reviewed  Patient has primary care physician Yes  Home environment has been reviewed Home with spouse  Prior level of function: Independent with spouse and daughter support  Prior/Current Home Services No current home services  Social Determinants of Health Reivew SDOH reviewed no interventions necessary  Readmission risk has been reviewed Yes  Transition of care needs no transition of care needs at this time     Transition of Care Department [facility]) has reviewed patient and no TOC needs have been identified at this time. We will continue to monitor patient advancement through interdisciplinary progression rounds. If new patient transition needs arise, please place a TOC consult.

## 2023-06-27 NOTE — Progress Notes (Signed)
OT Cancellation Note  Patient Details Name: Jacob Fowler MRN: [id number] DOB: 02-12-1951   Cancelled Treatment:    Reason Eval/Treat Not Completed: OT screened, no needs identified, will sign off. Per discussion with PT and family, pt is at his baseline. He is typically +2 for all functional transfers and LB ADL's. He was and continues to be independent with self feeding and completing hobbies such as reading. Family educated on following up with PCP for referral to therapies if needs change. Thank you for the referral.   Trish Mage, OTR/L [facility] Cederic Mozley Elane Bing Plume 06/27/2023, 3:25 PM

## 2023-06-27 NOTE — Progress Notes (Addendum)
PT Screen and Discharge Note  Patient Details Name: Amelio Kalkowski MRN: [id number] DOB: [birth year]   PT Screen Note:    Reason Eval/Treat Not Completed: PT screened, no needs identified, will sign off   Entered patient's room at 1409.  Patient oriented to person and place.  Patient's wife and daughter present and providing most of PLOF.Discussed with patient's wife and daughter at currently, patient is transfer assist of +2 for stand pivot transfers to wheelchair.  Caregivers reporting no concerns with current level of transfer status.  Currently is nonambulatory and has not been ambulatory for +1 years now.  Patient is dependent upon wife and daughter for lower body dressing, toileting, showering and hygiene.  Per daughter, patient is independent for feeding and reading.  Discussed with patient's daughter and wife regarding Michiel Sites lift for future assistance if becomes to physically demanding, as well as passive range of motion for lower extremities due to nonambulatory status at this time.  Discussed with patient's daughter and wife, primary caregivers, any additional needs, and patient's daughter and wife reporting no new needs from skilled physical therapy services at this time.  Patient's daughter reporting he is at his functional baseline.  Skilled PT services to sign off and assist this time no new needs established.  Nelida Meuse 06/27/2023, 3:06 PM

## 2023-06-28 DIAGNOSIS — N453 Epididymo-orchitis: Secondary | ICD-10-CM | POA: Diagnosis not present

## 2023-06-28 LAB — GLUCOSE, CAPILLARY
Glucose-Capillary: 112 mg/dL — ABNORMAL HIGH (ref 70–99)
Glucose-Capillary: 123 mg/dL — ABNORMAL HIGH (ref 70–99)
Glucose-Capillary: 207 mg/dL — ABNORMAL HIGH (ref 70–99)
Glucose-Capillary: 339 mg/dL — ABNORMAL HIGH (ref 70–99)
Glucose-Capillary: 310 mg/dL — ABNORMAL HIGH (ref 70–99)

## 2023-06-28 LAB — BLOOD CULTURE ID PANEL (REFLEXED) - BCID2

## 2023-06-28 LAB — BASIC METABOLIC PANEL
Anion gap: 9 (ref 5–15)
BUN: 20 mg/dL (ref 8–23)
CO2: 18 mmol/L — ABNORMAL LOW (ref 22–32)
Calcium: 9.1 mg/dL (ref 8.9–10.3)
Chloride: 106 mmol/L (ref 98–111)
Creatinine, Ser: 1.61 mg/dL — ABNORMAL HIGH (ref 0.61–1.24)
GFR, Estimated: 45 mL/min — ABNORMAL LOW (ref >60)
Glucose, Bld: 201 mg/dL — ABNORMAL HIGH (ref 70–99)
Potassium: 4.1 mmol/L (ref 3.5–5.1)
Sodium: 133 mmol/L — ABNORMAL LOW (ref 135–145)

## 2023-06-28 LAB — CBC
HCT: 34.4 % — ABNORMAL LOW (ref 39.0–52.0)
Hemoglobin: 10.9 g/dL — ABNORMAL LOW (ref 13.0–17.0)
MCH: 26.7 pg (ref 26.0–34.0)
MCHC: 31.7 g/dL (ref 30.0–36.0)
MCV: 84.3 fL (ref 80.0–100.0)
Platelets: 167 10*3/uL (ref 150–400)
RBC: 4.08 MIL/uL — ABNORMAL LOW (ref 4.22–5.81)
RDW: 15.7 % — ABNORMAL HIGH (ref 11.5–15.5)
WBC: 9.7 10*3/uL (ref 4.0–10.5)
nRBC: 0 % (ref 0.0–0.2)

## 2023-06-28 LAB — CULTURE, BLOOD (ROUTINE X 2): Special Requests: ADEQUATE

## 2023-06-28 MED ORDER — GUAIFENESIN-DM 100-10 MG/5ML PO SYRP: 5.0000 mL | ORAL_SOLUTION | ORAL | Status: DC | PRN

## 2023-06-28 NOTE — Progress Notes (Signed)
SLP Cancellation Note  Patient Details Name: Jacob Fowler MRN: [id number] DOB: [birth year]   Cancelled treatment:       Reason Eval/Treat Not Completed: SLP screened, no needs identified, will sign off; SLP screened Pt in room. Pt denies any changes in swallowing, speech, language, or cognition. MRI negative for acute changes. SLE will be deferred at this time. Reconsult if indicated. SLP will sign off.   Thank you,  Havery Moros, CCC-SLP [phone]  Georgeann Brinkman 06/28/2023, 3:58 PM

## 2023-06-28 NOTE — Plan of Care (Signed)

## 2023-06-28 NOTE — Progress Notes (Signed)
PROGRESS NOTE  Jacob Fowler, is a 72 y.o. male, DOB - 1950-09-02, WGN:[id number]  Admit date - 06/26/2023   Admitting Physician Onnie Boer, MD  Outpatient Primary MD for the patient is Bennie Pierini, FNP  LOS - 2  Chief Complaint  Patient presents with   Altered Mental Status    Brief Narrative:  72 y.o. male with medical history significant for multiple sclerosis, BPH, diabetes mellitus, hypertension, hypothyroidism, chronic indwelling Foley catheter admitted on 06/26/2023 with right-sided epididymoorchitis and catheter associated UTI--POA    -Assessment and Plan: 1)Sepsis secondary to acute right-sided epididymoorchitis --- ultrasound findings noted without torsion or other more concerning findings 06/28/23 Urine culture from 06/26/2023 with gross Pseudomonas aeruginosa and Klebsiella aerogenes =-Blood cultures from 06/26/2023 with staph species probably contaminant final ID pending -Continue IV cefepime -Added doxycycline WBC 25.4 >>14.7>>9.7 -Curbside urologist who recommends 2 weeks of antibiotics  2) Pseudomonas aeruginosa and Klebsiella aerogenes sepsis secondary to complicated UTI /CAUTI---POA - Urine Cx from 06/26/23 growing  Pseudomonas aeruginosa and Klebsiella aerogenes ---sensitivities are pending -Resuscitated adequately with IV fluids -Antibiotics as above #1  3)Acute metabolic encephalopathy-due to #1 and #2 above -  Head CT without acute abnormality.   -Mentation much improved with treatment as above #1 and # 2  4)Pancreatic Lesion-----?? Pancreatic Malignancy 3.1 cm lesion along the inferior aspect of the proximal pancreatic tail, new from prior, raising concern for pancreatic adenocarcinoma. Focal pancreatitis is considered less likely. Consider EUS for tissue sampling. When patient is stable (without concerns of motion degradation),  -short-term follow-up MRI abdomen with/without contrast is also suggested patient and if MRI/MRCP  findings are concerning patient will need EGD with EUS for tissue sampling  5)Hyponatremia -Sodium normalized with hydration  6)MS (multiple sclerosis) (HCC) Ambulates with wheelchair.  Weakness to right lower extremity, phonic indwelling Foley catheter.  Lives at home with family.  7)Hypertension associated with type 2 diabetes mellitus (HCC) -Hold olmesartan, Imdur due to soft BP  8)Hyperlipidemia associated with type 2 diabetes mellitus (HCC) -Hold Ozempic -Restart Lantus Use Novolog/Humalog Sliding scale insulin with Accu-Cheks/Fingersticks as ordered   9) BPH--- okay to continue Proscar but hold Rapaflo   Status is: Inpatient   Disposition: The patient is from: Home              Anticipated d/c is to: Home              Anticipated d/c date is: 1 day              Patient currently is not medically stable to d/c. Barriers: Not Clinically Stable-   Code Status :  -  Code Status: Full Code   Family Communication:  (patient is alert, awake and coherent)  Daughter Mitzi and wife at bedside  DVT Prophylaxis  :   - SCDs   enoxaparin (LOVENOX) injection 40 mg Start: 06/27/23 1000   Lab Results  Component Value Date   PLT 167 06/28/2023   Inpatient Medications  Scheduled Meds:  aspirin EC  81 mg Oral Daily   Chlorhexidine Gluconate Cloth  6 each Topical Daily   doxycycline  100 mg Oral Q12H   enoxaparin (LOVENOX) injection  40 mg Subcutaneous Q24H   finasteride  5 mg Oral Daily   Gerhardt's butt cream   Topical BID   insulin aspart  0-15 Units Subcutaneous Q4H   insulin glargine-yfgn  30 Units Subcutaneous QHS   leptosperum manuka honey  1 Application Topical Daily   levothyroxine  88 mcg  Oral Q0600   pantoprazole  40 mg Oral Daily   Continuous Infusions:  ceFEPime (MAXIPIME) IV Stopped (06/28/23 1809)   PRN Meds:.acetaminophen **OR** acetaminophen, ondansetron **OR** ondansetron (ZOFRAN) IV, polyethylene glycol   Anti-infectives (From admission, onward)     Start     Dose/Rate Route Frequency Ordered Stop   06/27/23 2200  doxycycline (VIBRA-TABS) tablet 100 mg        100 mg Oral Every 12 hours 06/27/23 1737     06/27/23 0400  ceFEPIme (MAXIPIME) 2 g in sodium chloride 0.9 % 100 mL IVPB        2 g 200 mL/hr over 30 Minutes Intravenous Every 12 hours 06/26/23 1550 07/04/23 0359   06/26/23 1430  ceFEPIme (MAXIPIME) 2 g in sodium chloride 0.9 % 100 mL IVPB        2 g 200 mL/hr over 30 Minutes Intravenous  Once 06/26/23 1423 06/26/23 1604      Subjective: Faylene Million today has no fevers, no emesis,  No chest pain,    -Wife and daughter at bedside, questions answered =-Oral intake is fair -Urine output is good  Objective: Vitals:   06/27/23 2149 06/28/23 0356 06/28/23 0953 06/28/23 1451  BP: 126/70 114/69 122/67 130/71  Pulse: 73 71 81 77  Resp: 18 19 19 20   Temp: 97.8 F (36.6 C) 98.1 F (36.7 C) 98.4 F (36.9 C) 98.2 F (36.8 C)  TempSrc: Oral Oral Oral Oral  SpO2: 97% 97% 97% 96%  Weight:      Height:        Intake/Output Summary (Last 24 hours) at 06/28/2023 1841 Last data filed at 06/28/2023 1342 Gross per 24 hour  Intake 440 ml  Output 3600 ml  Net -3160 ml   Filed Weights   06/26/23 1328 06/26/23 2230 06/27/23 0600  Weight: 72.6 kg 72.1 kg 75.7 kg   Physical Exam Gen:- Awake Alert,  in no apparent distress  HEENT:- [place].AT, No sclera icterus Neck-Supple Neck,No JVD,.  Lungs-  CTAB , fair symmetrical air movement CV- S1, S2 normal, regular  Abd-  +ve B.Sounds, Abd Soft, No tenderness, no CVA area tenderness Extremity/Skin:- No  edema, pedal pulses present  Psych-affect is appropriate, oriented x3 Neuro-chronic neuromuscular deficits consistent with underlying multiple sclerosis, no additional new focal deficits, no tremors GU-Foley catheter with less cloudy urine  Data Reviewed: I have personally reviewed following labs and imaging studies  CBC: Recent Labs  Lab 06/26/23 1403 06/27/23 0258  06/28/23 0908  WBC 25.4* 14.7* 9.7  NEUTROABS 21.7*  --   --   HGB 11.4* 9.5* 10.9*  HCT 35.6* 30.5* 34.4*  MCV 83.2 83.8 84.3  PLT 239 167 167   Basic Metabolic Panel: Recent Labs  Lab 06/26/23 1403 06/27/23 0258 06/28/23 0908  NA 127* 136 133*  K 4.0 3.5 4.1  CL 92* 107 106  CO2 22 20* 18*  GLUCOSE 208* 84 201*  BUN 23 19 20   CREATININE 1.86* 1.55* 1.61*  CALCIUM 9.4 8.8* 9.1   GFR: Estimated Creatinine Clearance: 36.9 mL/min (A) (by C-G formula based on SCr of 1.61 mg/dL (H)). Liver Function Tests: Recent Labs  Lab 06/26/23 1403  AST 27  ALT 24  ALKPHOS 149*  BILITOT 0.8  PROT 7.7  ALBUMIN 3.5   Recent Results (from the past 240 hours)  Urine Culture     Status: Abnormal (Preliminary result)   Collection Time: 06/26/23  2:00 PM   Specimen: Urine, Clean Catch  Result Value Ref  Range Status   Specimen Description   Final    URINE, CLEAN CATCH Performed at [facility], [address] [place], [place] [address]    Special Requests   Final    NONE Reflexed from [id number] Performed at [facility], [address] [place], [place] [address]    Culture (A)  Final    >=100,000 COLONIES/mL PSEUDOMONAS AERUGINOSA 50,000 COLONIES/mL KLEBSIELLA AEROGENES    Report Status PENDING  Incomplete  Blood Culture (routine x 2)     Status: Abnormal   Collection Time: 06/26/23  2:03 PM   Specimen: BLOOD  Result Value Ref Range Status   Specimen Description   Final    BLOOD RIGHT ASSIST CONTROL Performed at [facility], [address], [place], [place] [address]    Special Requests   Final    BOTTLES DRAWN AEROBIC AND ANAEROBIC Blood Culture adequate volume Performed at [facility], [address], [place], [place] [address]    Culture  Setup Time   Final    AEROBIC BOTTLE ONLY GRAM POSITIVE COCCI Gram Stain Report Called to,Read Back By and Verified With: JUDY BELL @1640  ON 06/27/23 C VARNER CRITICAL RESULT CALLED TO, READ BACK BY AND VERIFIED WITH: Perlie Mayo  RN 06/28/2023 @ 0038 BY AB    Culture (A)  Final    STAPHYLOCOCCUS HAEMOLYTICUS THE SIGNIFICANCE OF ISOLATING THIS ORGANISM FROM A SINGLE SET OF BLOOD CULTURES WHEN MULTIPLE SETS ARE DRAWN IS UNCERTAIN. PLEASE NOTIFY THE MICROBIOLOGY DEPARTMENT WITHIN ONE WEEK IF SPECIATION AND SENSITIVITIES ARE REQUIRED. Performed at [facility], [address] [place], [place] [address]    Report Status 06/28/2023 FINAL  Final  Blood Culture ID Panel (Reflexed)     Status: Abnormal   Collection Time: 06/26/23  2:03 PM  Result Value Ref Range Status   Enterococcus faecalis NOT DETECTED NOT DETECTED Final   Enterococcus Faecium NOT DETECTED NOT DETECTED Final   Listeria monocytogenes NOT DETECTED NOT DETECTED Final   Staphylococcus species DETECTED (A) NOT DETECTED Final    Comment: CRITICAL RESULT CALLED TO, READ BACK BY AND VERIFIED WITH: Perlie Mayo RN 06/28/2023 @ 0038 BY AB    Staphylococcus aureus (BCID) NOT DETECTED NOT DETECTED Final   Staphylococcus epidermidis NOT DETECTED NOT DETECTED Final   Staphylococcus lugdunensis NOT DETECTED NOT DETECTED Final   Streptococcus species NOT DETECTED NOT DETECTED Final   Streptococcus agalactiae NOT DETECTED NOT DETECTED Final   Streptococcus pneumoniae NOT DETECTED NOT DETECTED Final   Streptococcus pyogenes NOT DETECTED NOT DETECTED Final   A.calcoaceticus-baumannii NOT DETECTED NOT DETECTED Final   Bacteroides fragilis NOT DETECTED NOT DETECTED Final   Enterobacterales NOT DETECTED NOT DETECTED Final   Enterobacter cloacae complex NOT DETECTED NOT DETECTED Final   Escherichia coli NOT DETECTED NOT DETECTED Final   Klebsiella aerogenes NOT DETECTED NOT DETECTED Final   Klebsiella oxytoca NOT DETECTED NOT DETECTED Final   Klebsiella pneumoniae NOT DETECTED NOT DETECTED Final   Proteus species NOT DETECTED NOT DETECTED Final   Salmonella species NOT DETECTED NOT DETECTED Final   Serratia marcescens NOT DETECTED NOT DETECTED Final   Haemophilus  influenzae NOT DETECTED NOT DETECTED Final   Neisseria meningitidis NOT DETECTED NOT DETECTED Final   Pseudomonas aeruginosa NOT DETECTED NOT DETECTED Final   Stenotrophomonas maltophilia NOT DETECTED NOT DETECTED Final   Candida albicans NOT DETECTED NOT DETECTED Final   Candida auris NOT DETECTED NOT DETECTED Final   Candida glabrata NOT DETECTED NOT DETECTED Final  Candida krusei NOT DETECTED NOT DETECTED Final   Candida parapsilosis NOT DETECTED NOT DETECTED Final   Candida tropicalis NOT DETECTED NOT DETECTED Final   Cryptococcus neoformans/gattii NOT DETECTED NOT DETECTED Final    Comment: Performed at [facility], [address] [place], [place] [address]  Blood Culture (routine x 2)     Status: None (Preliminary result)   Collection Time: 06/26/23  2:53 PM   Specimen: BLOOD  Result Value Ref Range Status   Specimen Description BLOOD rt wrist  Final   Special Requests   Final    BOTTLES DRAWN AEROBIC AND ANAEROBIC Blood Culture adequate volume   Culture   Final    NO GROWTH 2 DAYS Performed at [facility], [address], [place], [place] [address]    Report Status PENDING  Incomplete  Resp panel by RT-PCR (RSV, Flu A&B, Covid)     Status: None   Collection Time: 06/26/23  3:50 PM  Result Value Ref Range Status   SARS Coronavirus 2 by RT PCR NEGATIVE NEGATIVE Final    Comment: (NOTE) SARS-CoV-2 target nucleic acids are NOT DETECTED.  The SARS-CoV-2 RNA is generally detectable in upper respiratory specimens during the acute phase of infection. The lowest concentration of SARS-CoV-2 viral copies this assay can detect is 138 copies/mL. A negative result does not preclude SARS-Cov-2 infection and should not be used as the sole basis for treatment or other patient management decisions. A negative result may occur with  improper specimen collection/handling, submission of specimen other than nasopharyngeal swab, presence of viral mutation(s) within the areas targeted  by this assay, and inadequate number of viral copies(<138 copies/mL). A negative result must be combined with clinical observations, patient history, and epidemiological information. The expected result is Negative.  Fact Sheet for Patients:  BloggerCourse.com  Fact Sheet for Healthcare Providers:  SeriousBroker.it  This test is no t yet approved or cleared by the [place] FDA and  has been authorized for detection and/or diagnosis of SARS-CoV-2 by FDA under an Emergency Use Authorization (EUA). This EUA will remain  in effect (meaning this test can be used) for the duration of the COVID-19 declaration under Section 564(b)(1) of the Act, 21 U.S.C.section 360bbb-3(b)(1), unless the authorization is terminated  or revoked sooner.       Influenza A by PCR NEGATIVE NEGATIVE Final   Influenza B by PCR NEGATIVE NEGATIVE Final    Comment: (NOTE) The Xpert Xpress SARS-CoV-2/FLU/RSV plus assay is intended as an aid in the diagnosis of influenza from Nasopharyngeal swab specimens and should not be used as a sole basis for treatment. Nasal washings and aspirates are unacceptable for Xpert Xpress SARS-CoV-2/FLU/RSV testing.  Fact Sheet for Patients: BloggerCourse.com  Fact Sheet for Healthcare Providers: SeriousBroker.it  This test is not yet approved or cleared by the [place] FDA and has been authorized for detection and/or diagnosis of SARS-CoV-2 by FDA under an Emergency Use Authorization (EUA). This EUA will remain in effect (meaning this test can be used) for the duration of the COVID-19 declaration under Section 564(b)(1) of the Act, 21 U.S.C. section 360bbb-3(b)(1), unless the authorization is terminated or revoked.     Resp Syncytial Virus by PCR NEGATIVE NEGATIVE Final    Comment: (NOTE) Fact Sheet for Patients: BloggerCourse.com  Fact  Sheet for Healthcare Providers: SeriousBroker.it  This test is not yet approved or cleared by the [place] FDA and has been authorized for detection and/or diagnosis of SARS-CoV-2 by FDA under an  Emergency Use Authorization (EUA). This EUA will remain in effect (meaning this test can be used) for the duration of the COVID-19 declaration under Section 564(b)(1) of the Act, 21 U.S.C. section 360bbb-3(b)(1), unless the authorization is terminated or revoked.  Performed at [facility], [address] [place], [place] [address]   Urine Culture     Status: Abnormal (Preliminary result)   Collection Time: 06/26/23  5:12 PM   Specimen: Urine, Catheterized  Result Value Ref Range Status   Specimen Description   Final    URINE, CATHETERIZED Performed at [facility], [address] [place], [place] [address]    Special Requests   Final    NONE Reflexed from [id number] Performed at [facility], [address], [place], [place] [address]    Culture 70,000 COLONIES/mL PSEUDOMONAS AERUGINOSA (A)  Final   Report Status PENDING  Incomplete  MRSA Next Gen by PCR, Nasal     Status: None   Collection Time: 06/26/23 10:14 PM   Specimen: Nasal Mucosa; Nasal Swab  Result Value Ref Range Status   MRSA by PCR Next Gen NOT DETECTED NOT DETECTED Final    Comment: (NOTE) The GeneXpert MRSA Assay (FDA approved for NASAL specimens only), is one component of a comprehensive MRSA colonization surveillance program. It is not intended to diagnose MRSA infection nor to guide or monitor treatment for MRSA infections. Test performance is not FDA approved in patients less than 5 years old. Performed at [facility], [address] [place], [place] [address]     Radiology Studies: US SCROTUM W/DOPPLER Result Date: 06/27/2023 CLINICAL DATA:  Pain EXAM: SCROTAL ULTRASOUND DOPPLER ULTRASOUND OF THE TESTICLES TECHNIQUE: Complete ultrasound examination of the testicles,  epididymis, and other scrotal structures was performed. Color and spectral Doppler ultrasound were also utilized to evaluate blood flow to the testicles. COMPARISON:  11/30/2020 FINDINGS: Right testicle Measurements: 3.8 x 2.7 x 2.8 cm. Hypervascularity. No mass or microlithiasis visualized. Left testicle Measurements: 2.9 x 1.6 x 2.6 cm. No mass or microlithiasis visualized. Right epididymis: Enlarged and heterogeneous with increased vascularity. Left epididymis:  Normal in size and appearance. Hydrocele: Small-moderate-sized complex right hydrocele with numerous internal septations. Small simple appearing left-sided hydrocele. Varicocele:  None visualized. Scrotal wall: Right greater than left skin thickening. Right-sided scrotal wall hyperemia. No organized fluid collections. Pulsed Doppler interrogation of both testes demonstrates normal low resistance arterial and venous waveforms bilaterally. IMPRESSION: 1. Findings compatible with acute right-sided epididymoorchitis. 2. Small-moderate-sized complex right hydrocele with numerous internal septations. 3. Right greater than left scrotal wall thickening and hyperemia concerning for cellulitis. No organized fluid collections. 4. Small simple appearing left-sided hydrocele. Electronically Signed   By: Duanne Guess D.O.   On: 06/27/2023 14:24   Scheduled Meds:  aspirin EC  81 mg Oral Daily   Chlorhexidine Gluconate Cloth  6 each Topical Daily   doxycycline  100 mg Oral Q12H   enoxaparin (LOVENOX) injection  40 mg Subcutaneous Q24H   finasteride  5 mg Oral Daily   Gerhardt's butt cream   Topical BID   insulin aspart  0-15 Units Subcutaneous Q4H   insulin glargine-yfgn  30 Units Subcutaneous QHS   leptosperum manuka honey  1 Application Topical Daily   levothyroxine  88 mcg Oral Q0600   pantoprazole  40 mg Oral Daily   Continuous Infusions:  ceFEPime (MAXIPIME) IV Stopped (06/28/23 1809)    LOS: 2 days   Shon Hale M.D on 06/28/2023 at 6:41  PM  Go  to [email or web address] - for contact info  [facility] - Office  [phone]  If 7PM-7AM, please contact night-coverage [email or web address] 06/28/2023, 6:41 PM

## 2023-06-28 NOTE — Progress Notes (Signed)
Lab called to reports positive findings from blood cultures. Please see lab results. RN has tried to assess and change his dressings 3 times overnight with each time pt refusing. Education on the med honey and butt cream importance to healing. Pt states he will "get cleaned up later and check on it then." Wife is at bedside and agrees.

## 2023-06-29 DIAGNOSIS — N453 Epididymo-orchitis: Secondary | ICD-10-CM | POA: Diagnosis not present

## 2023-06-29 LAB — URINE CULTURE
Culture: >=100000 — AB
Culture: 70000 — AB

## 2023-06-29 LAB — GLUCOSE, CAPILLARY
Glucose-Capillary: 159 mg/dL — ABNORMAL HIGH (ref 70–99)
Glucose-Capillary: 170 mg/dL — ABNORMAL HIGH (ref 70–99)
Glucose-Capillary: 245 mg/dL — ABNORMAL HIGH (ref 70–99)
Glucose-Capillary: 285 mg/dL — ABNORMAL HIGH (ref 70–99)

## 2023-06-29 MED ORDER — THERAHONEY EX GEL: 1.0000 | Freq: Every day | CUTANEOUS | 1 refills | Status: DC

## 2023-06-29 MED ORDER — ACETAMINOPHEN 325 MG PO TABS: 650.0000 mg | ORAL_TABLET | Freq: Four times a day (QID) | ORAL | Status: DC | PRN

## 2023-06-29 MED ORDER — ZINC OXIDE 40 % EX OINT: TOPICAL_OINTMENT | Freq: Two times a day (BID) | CUTANEOUS | 2 refills | Status: DC

## 2023-06-29 MED ORDER — CIPROFLOXACIN HCL 500 MG PO TABS: 500.0000 mg | ORAL_TABLET | Freq: Two times a day (BID) | ORAL | 0 refills | Status: AC

## 2023-06-29 MED ORDER — ZINC OXIDE 40 % EX OINT
TOPICAL_OINTMENT | Freq: Two times a day (BID) | CUTANEOUS | Status: DC
  Filled 2023-06-29: qty 57

## 2023-06-29 NOTE — Progress Notes (Signed)
Old foley cath removed and new Coude cath placed by Linward Headland RN. Patient tolerated well.

## 2023-06-29 NOTE — Discharge Summary (Signed)
Jacob Fowler, is a 72 y.o. male  DOB 1950-09-01  MRN [id number].  Admission date:  06/26/2023  Admitting Physician  Onnie Boer, MD  Discharge Date:  06/29/2023   Primary MD  Bennie Pierini, FNP  Recommendations for primary care physician for things to follow:  1)Buttock Wound Care :- A)Apply Thin layer of Desitin  (Zinc Oxide) 2 times daily and as additional application needed for soiling- and then cover with silicone foam or ABD pad whichever is preferred.  B)Please apply Medihoney daily to necrotic areas-cover with dry gauze  C)Please Turn Patient and Change positions every couple of hours--Avoid laying or sitting on your Buttocks s for longer than a couple of hours at a time  2) continue to change Foley catheter at least once every month  3)Avoid excessive plain water intake----as discussed to avoid low sodium  4)Repeat CBC and BMP blood test in about 7 to 10 days with primary care physician advised  5)Please follow-up with Urologist Dr. Ronne Binning in about --- in 3 to 4 weeks for recheck and follow-up evaluation  in his office----[facility], [address], [address], [place] [address] Phone Number----[phone]  6)Pancreatic Lesion----short-term follow-up MRI abdomen with/without contrast is also suggested patient and if MRI/MRCP findings are concerning patient will need EGD with EUS for tissue sampling  Admission Diagnosis  Pancreatic mass [K86.89] Complicated UTI (urinary tract infection) [N39.0] Altered mental status, unspecified altered mental status type [R41.82] Sepsis, due to unspecified organism, unspecified whether acute organ dysfunction present [facility]) [A41.9]   Discharge Diagnosis  Pancreatic mass [K86.89] Complicated UTI (urinary tract infection) [N39.0] Altered mental status, unspecified altered mental status type [R41.82] Sepsis, due to  unspecified organism, unspecified whether acute organ dysfunction present [facility]) [A41.9]    Principal Problem:   acute right-sided epididymoorchitis. 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   MS (multiple sclerosis) (HCC)   Chronic indwelling Foley catheter   Hyponatremia   Hypertension associated with type 2 diabetes mellitus (HCC)   BPH with obstruction/lower urinary tract symptoms   Uncontrolled type 2 diabetes mellitus with hyperglycemia, without long-term current use of insulin (HCC)   Hyperlipidemia associated with type 2 diabetes mellitus (HCC)   Hypothyroidism      Past Medical History:  Diagnosis Date   Acquired leg length discrepancy    Bladder stone    BPH associated with nocturia    CAD (coronary artery disease) CARDIOLOGIST- DR HOCHREIN--  LAST VISIT 09-27-2011 IN EPIC   Non obstructive Cath 2010-  HX CORONARY SPASM 2004   Cataract    bilateral    GERD (gastroesophageal reflux disease)    H/O hiatal hernia    Heart murmur    Heartburn    High cholesterol    History of nephrolithiasis 2009   Hx of adenomatous colonic polyps    Hyperlipidemia    Hypertension    Movement disorder    Multiple sclerosis (HCC)  DX  10/10---  NEUROLOGIST  DR RICHARD FATER ([place])   RIGHT SIDE AFFECTED MORE W/ WEAKNESS- - USES CANE   Nephrolithiasis 08/30/2020   Neuromuscular disorder (HCC)    MS   NIDDM (non-insulin dependent diabetes mellitus)    Peyronie's disease    RBBB    Renal stone RIGHT   Rosacea    Vision abnormalities     Past Surgical History:  Procedure Laterality Date   CARDIAC CATHETERIZATION  02-26-2009 ---  DR Julien Nordmann   NONOBSTRUCTIVE CAD/ 50% DISTAL RCA/ 30% LAD / NORMAL LVF   CYSTOSCOPY W/ RETROGRADES  10/30/2011   Procedure: CYSTOSCOPY WITH RETROGRADE PYELOGRAM;  Surgeon: Garnett Farm, MD;  Location: [facility];  Service:  Urology;  Laterality: Right;   CYSTOSCOPY WITH LITHOLAPAXY  10/30/2011   Procedure: CYSTOSCOPY WITH LITHOLAPAXY;  Surgeon: Garnett Farm, MD;  Location: [facility];  Service: Urology;  Laterality: N/A;   CYSTOSCOPY WITH RETROGRADE PYELOGRAM, URETEROSCOPY AND STENT PLACEMENT Left 11/25/2020   Procedure: CYSTOSCOPY WITH LEFT RETROGRADE PYELOGRAM, LEFT URETEROSCOPY WITH LASER AND LEFT URETERAL STENT PLACEMENT;  Surgeon: Malen Gauze, MD;  Location: [facility];  Service: Urology;  Laterality: Left;   EXTRACORPOREAL SHOCK WAVE LITHOTRIPSY  09/05/2010   RIGHT   EYE SURGERY Right 08/30/2018   HOLMIUM LASER APPLICATION Left 11/25/2020   Procedure: HOLMIUM LASER APPLICATION;  Surgeon: Malen Gauze, MD;  Location: [facility];  Service: Urology;  Laterality: Left;   NESBIT PROCEDURE  10/17/2004   CORRECTION OF PENILE ANGULATION (PEYRONIES DISEASE)   UTI  06/17/2022    HPI  from the history and physical done on the day of admission:    Chief Complaint: AMS   HPI: Jacob Fowler is a 72 y.o. male with medical history significant for multiple sclerosis, BPH, diabetes mellitus, hypertension, hypothyroidism, chronic indwelling Foley catheter. Patient was brought to the ED with complaints of increasing lethargy.  At the time of my evaluation, patient is awake and able to answer simple questions, but is unable to give details.  Daughter and spouse at bedside.  Did notice blood in his Foley catheter, with greenish urine urine in the bag, this was emptied out and subsequently drained clear urine.  Today he was out of it, and lethargic.  Today he also noticed that his scrotum was red, but without pain. He has otherwise had good oral intake, no vomiting no diarrhea, no difficulty breathing or cough.  No abdominal or chest pain.  At baseline he has no memory deficits, he ambulates with a wheelchair due to MS mostly affecting his right lower extremity and bladder.   ED Course: Tmax  99.6.  Respiratory rate 16-23.  Blood pressure systolic 79-110.  O2 sats 80-126.   2.25 L bolus given with improvement in blood pressure. Lactic acid 1.9 > 1.7. Leukocytosis of 25.4. UA suggestive of UTI. CT abdomen pelvis with contrast suggest cystitis, also pancreatic lesion.  Head CT negative for acute abnormality. CTA chest negative for PE. 2 g ceftriaxone given.   Review of Systems: As per HPI all other systems reviewed and negative.     Hospital Course:   Brief Narrative:  72 y.o. male with medical history significant for multiple sclerosis, BPH, diabetes mellitus, hypertension, hypothyroidism, chronic indwelling Foley catheter admitted on 06/26/2023 with right-sided epididymoorchitis and catheter associated UTI--POA     -Assessment and Plan: 1)Sepsis secondary to acute right-sided epididymoorchitis --- ultrasound findings noted without torsion or other more concerning findings  Urine culture from 06/26/2023 with  Pseudomonas aeruginosa and Klebsiella aerogenes =-Blood cultures from 06/26/2023 with staph hemolyticus probably contaminant   -Patient was treated with IV cefepime and doxycycline WBC 25.4 >>14.7>>9.7 -Curbside urologist who recommends up to 2 weeks of antibiotics -Okay to discharge on Cipro to complete antibiotic therapy as per sensitivity report   2) Pseudomonas aeruginosa and Klebsiella aerogenes sepsis secondary to complicated UTI /CAUTI---POA - Urine Cx from 06/26/23 growing  Pseudomonas aeruginosa and Klebsiella aerogenes ---sensitivities noted -Resuscitated adequately with IV fluids -Antibiotics as above #1 -Foley catheter was changed on 06/29/2023 prior to discharge, follow-up urologist and have Foley changed once a month either by home health RN or urologist   3)Acute metabolic encephalopathy-due to #1 and #2 above -  Head CT without acute abnormality.   -Mentation is back to baseline with treatment as above #1 and # 2   4)Pancreatic Lesion-----??  Pancreatic Malignancy 3.1 cm lesion along the inferior aspect of the proximal pancreatic tail, new from prior, raising concern for pancreatic adenocarcinoma. Focal pancreatitis is considered less likely. Consider EUS for tissue sampling. When patient is stable (without concerns of motion degradation),  -short-term follow-up MRI abdomen with/without contrast is also suggested patient and if MRI/MRCP findings are concerning patient will need EGD with EUS for tissue sampling   5)Hyponatremia -At home patient drinks 4 to 5 L of plain water each day  -Patient is advised to avoid excessive free water    6)MS (multiple sclerosis) (HCC) Ambulates with wheelchair.  Weakness to right lower extremity, chronic indwelling Foley catheter.  Lives at home with family.   7)Hypertension associated with type 2 diabetes mellitus (HCC) -BP stabilized,  okay to resume PTA meds   8)Hyperlipidemia associated with type 2 diabetes mellitus (HCC) --Restart PTA diabetic regimen   9) BPH--- okay to continue Proscar  and Rapaflo   Disposition: The patient is from: Home              Anticipated d/c is to: Home  Discharge Condition: stable  Follow UP   Follow-up Information     Bennie Pierini, FNP. Schedule an appointment as soon as possible for a visit in 2 week(s).   Specialty: Family Medicine Contact information: [address] [place] [address] [phone]         [facility] Follow up.   Specialty: Home Health Services Why: Agency will call to set up home visits. Contact information: [address] [place] [address] [phone]         Malen Gauze, MD. Schedule an appointment as soon as possible for a visit in 3 week(s).   Specialty: Urology Contact information: [address]  [address] [place] [address] [phone]                 Consults obtained -curbside consultation with urologist  Diet and Activity  recommendation:  As advised  Discharge Instructions    Discharge Instructions     Call MD for:  difficulty breathing, headache or visual disturbances   Complete by: As directed    Call MD for:  persistant dizziness or light-headedness   Complete by: As directed    Call MD for:  persistant nausea and vomiting   Complete by: As directed    Call MD for:  temperature >100.4   Complete by: As directed    Diet general   Complete by: As directed    Discharge instructions   Complete by: As directed  1)Buttock Wound Care :- A)Apply Thin layer of Desitin  (Zinc Oxide) 2 times daily and as additional application needed for soiling- and then cover with silicone foam or ABD pad whichever is preferred.  B)Please apply Medihoney daily to necrotic areas-cover with dry gauze  C)Please Turn Patient and Change positions every couple of hours--Avoid laying or sitting on your Buttocks s for longer than a couple of hours at a time  2) continue to change Foley catheter at least once every month  3)Avoid excessive plain water intake----as discussed to avoid low sodium  4)Repeat CBC and BMP blood test in about 7 to 10 days with primary care physician advised  5)Please follow-up with Urologist Dr. Ronne Binning in about --- in 3 to 4 weeks for recheck and follow-up evaluation  in his office----[facility], [address], [address], [place] [address] Phone Number----[phone]   Discharge wound care:   Complete by: As directed    1)Buttock Wound Care :- A)Apply Thin layer of Desitin  (Zinc Oxide) 2 times daily and as additional application needed for soiling- and then cover with silicone foam or ABD pad whichever is preferred.  B)Please apply Medihoney daily to necrotic areas-cover with dry gauze  C)Please Turn Patient and Change positions every couple of hours--Avoid laying or sitting on your Buttocks s for longer than a couple of hours at a time   Increase activity slowly   Complete by: As  directed          Discharge Medications     Allergies as of 06/29/2023       Reactions   Farxiga [dapagliflozin] Other (See Comments)   unable to take Marcelline Deist due to recurrent UTIs, urinary retention and hydronephrosis   Kerendia [finerenone] Other (See Comments)   hyperkalemia        Medication List     TAKE these medications    acetaminophen 325 MG tablet Commonly known as: TYLENOL Take 2 tablets (650 mg total) by mouth every 6 (six) hours as needed for mild pain (pain score 1-3) (or Fever >/= 101).   aspirin EC 81 MG tablet Take 81 mg by mouth daily.   ciprofloxacin 500 MG tablet Commonly known as: Cipro Take 1 tablet (500 mg total) by mouth 2 (two) times daily for 10 days.   cyanocobalamin 1000 MCG/ML injection Commonly known as: VITAMIN B12 INJECT INTO THE SKIN ONCE A MONTH   ferrous sulfate 325 (65 FE) MG tablet Take 1 tablet (325 mg total) by mouth daily with breakfast.   finasteride 5 MG tablet Commonly known as: PROSCAR Take 1 tablet (5 mg total) by mouth daily.   glucose blood test strip Use as instructed   Insulin Pen Needle 31G X 5 MM Misc Use with lantus pen as directed   isosorbide mononitrate 60 MG 24 hr tablet Commonly known as: IMDUR TAKE 1/2 TABLET (30MG  TOTAL) BY MOUTH DAILY   Lantus SoloStar 100 UNIT/ML Solostar Pen Generic drug: insulin glargine Inject 55 Units into the skin daily.   leptosperum manuka honey Gel gel Apply 1 Application topically daily. Apply daily to necrotic areas of Buttocks Start taking on: June 30, 2023   levothyroxine 88 MCG tablet Commonly known as: SYNTHROID Take 1 tablet (88 mcg total) by mouth daily.   liver oil-zinc oxide 40 % ointment Commonly known as: DESITIN Apply topically 2 (two) times daily. Apply Thin layer of Desitin  (Zinc Oxide) 2 times daily and as additional application needed for soiling.  magnesium oxide 400 MG tablet Commonly known as: MAG-OX Take 1 tablet by mouth  daily.   olmesartan 5 MG tablet Commonly known as: BENICAR Take 1 tablet (5 mg total) by mouth daily.   omeprazole 20 MG capsule Commonly known as: PRILOSEC TAKE ONE CAPSULE (20MG  TOTAL) BY MOUTH DAILY   Ozempic (0.25 or 0.5 MG/DOSE) 2 MG/3ML Sopn Generic drug: Semaglutide(0.25 or 0.5MG /DOS) Inject 0.25mg  into skin weekly for 4 weeks, then increase to 0.5mg  weekly thereafter. DX:E11.65   rosuvastatin 10 MG tablet Commonly known as: CRESTOR Take 1 tablet (10 mg total) by mouth daily.   silodosin 8 MG Caps capsule Commonly known as: RAPAFLO Take 1 capsule (8 mg total) by mouth at bedtime.   sodium bicarbonate 650 MG tablet Take 650 mg by mouth 3 (three) times daily.   sodium citrate-citric acid 500-334 MG/5ML solution Commonly known as: ORACIT Take 15 mLs by mouth 2 (two) times daily.   SPS (Sodium Polystyrene Sulf) 15 GM/60ML suspension Generic drug: sodium polystyrene Take 15 mLs by mouth in the morning and at bedtime.   VITAMIN C PO Take 500 mg by mouth in the morning and at bedtime.   Vitamin D 50 MCG (2000 UT) tablet Take 2,000 Units by mouth daily.               Discharge Care Instructions  (From admission, onward)           Start     Ordered   06/29/23 0000  Discharge wound care:       Comments: 1)Buttock Wound Care :- A)Apply Thin layer of Desitin  (Zinc Oxide) 2 times daily and as additional application needed for soiling- and then cover with silicone foam or ABD pad whichever is preferred.  B)Please apply Medihoney daily to necrotic areas-cover with dry gauze  C)Please Turn Patient and Change positions every couple of hours--Avoid laying or sitting on your Buttocks s for longer than a couple of hours at a time   06/29/23 1319            Major procedures and Radiology Reports - PLEASE review detailed and final reports for all details, in brief -   US SCROTUM W/DOPPLER Result Date: 06/27/2023 CLINICAL DATA:  Pain EXAM: SCROTAL ULTRASOUND  DOPPLER ULTRASOUND OF THE TESTICLES TECHNIQUE: Complete ultrasound examination of the testicles, epididymis, and other scrotal structures was performed. Color and spectral Doppler ultrasound were also utilized to evaluate blood flow to the testicles. COMPARISON:  11/30/2020 FINDINGS: Right testicle Measurements: 3.8 x 2.7 x 2.8 cm. Hypervascularity. No mass or microlithiasis visualized. Left testicle Measurements: 2.9 x 1.6 x 2.6 cm. No mass or microlithiasis visualized. Right epididymis: Enlarged and heterogeneous with increased vascularity. Left epididymis:  Normal in size and appearance. Hydrocele: Small-moderate-sized complex right hydrocele with numerous internal septations. Small simple appearing left-sided hydrocele. Varicocele:  None visualized. Scrotal wall: Right greater than left skin thickening. Right-sided scrotal wall hyperemia. No organized fluid collections. Pulsed Doppler interrogation of both testes demonstrates normal low resistance arterial and venous waveforms bilaterally. IMPRESSION: 1. Findings compatible with acute right-sided epididymoorchitis. 2. Small-moderate-sized complex right hydrocele with numerous internal septations. 3. Right greater than left scrotal wall thickening and hyperemia concerning for cellulitis. No organized fluid collections. 4. Small simple appearing left-sided hydrocele. Electronically Signed   By: Duanne Guess D.O.   On: 06/27/2023 14:24   CT Angio Chest PE W/Cm &/Or Wo Cm Result Date: 06/26/2023 CLINICAL DATA:  Abdominal pain, lethargy EXAM: CT ANGIOGRAPHY CHEST CT ABDOMEN  AND PELVIS WITH CONTRAST TECHNIQUE: Multidetector CT imaging of the chest was performed using the standard protocol during bolus administration of intravenous contrast. Multiplanar CT image reconstructions and MIPs were obtained to evaluate the vascular anatomy. Multidetector CT imaging of the abdomen and pelvis was performed using the standard protocol during bolus administration of  intravenous contrast. RADIATION DOSE REDUCTION: This exam was performed according to the departmental dose-optimization program which includes automated exposure control, adjustment of the mA and/or kV according to patient size and/or use of iterative reconstruction technique. CONTRAST:  80mL OMNIPAQUE IOHEXOL 350 MG/ML SOLN COMPARISON:  CT abdomen/pelvis dated 01/01/2023 FINDINGS: CTA CHEST FINDINGS Cardiovascular: Status are opacification of the bilateral pulmonary arteries to the segmental level. No evidence of pulmonary embolism. Although not tailored for evaluation of the thoracic aorta, there is no evidence of thoracic aortic aneurysm or dissection. The heart is normal in size.  No pericardial effusion. Severe three-vessel coronary atherosclerosis. Mediastinum/Nodes: No suspicious mediastinal adenopathy. Visualized thyroid is unremarkable. Lungs/Pleura: Dependent atelectasis in the bilateral upper and lower lobes. No focal consolidation. No suspicious pulmonary nodules. No pleural effusion or pneumothorax. Musculoskeletal: Visualized osseous structures are within normal limits. Review of the MIP images confirms the above findings. CT ABDOMEN and PELVIS FINDINGS Hepatobiliary: Liver is within normal limits. Gallbladder is unremarkable. No intrahepatic or extrahepatic duct dilatation. Pancreas: Lesion along the inferior aspect of the proximal pancreatic tail, measuring at least 2.5 x 3.1 cm (series 4/image 29), new from prior, with suspected mild parenchymal atrophy of the distal pancreatic tail (series 4/image 31). Although poorly visualized/evaluated, this raises concern for pancreatic adenocarcinoma. Focal pancreatitis is considered less likely. Spleen: Within normal limits. Adrenals/Urinary Tract: Adrenal glands are within normal limits. Kidneys are within normal limits. Mild to moderate right hydronephrosis with urothelial thickening. Mild left hydronephrosis with urothelial thickening. No renal calculi. On  delayed imaging, there is an apparent 4 defect in the right renal collecting system (series 8/image 17), although without associated enhancing lesion on the arterial phase, possibly reflecting debris. Thick-walled bladder with indwelling Foley catheter and mild perivesical stranding (series 4/image 85), suggesting cystitis. Stomach/Bowel: Stomach is notable for a small hiatal hernia. No evidence of bowel obstruction. Normal appendix (series 4/image 48). No colonic wall thickening or inflammatory changes. Vascular/Lymphatic: No evidence of abdominal aortic aneurysm. Atherosclerotic calcifications of the abdominal aorta and branch vessels, although vessels remain patent. Celiac axis abuts the suspected lesion. No suspicious abdominopelvic lymphadenopathy. Reproductive: Prostate is unremarkable. Other: No abdominopelvic ascites. Musculoskeletal: Degenerative changes of the lumbar spine. Review of the MIP images confirms the above findings. IMPRESSION: No evidence of pulmonary embolism. 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 Focal pancreatitis is considered less likely. Consider EUS for tissue sampling. When patient is stable (without concerns of motion degradation), short-term follow-up MRI abdomen with/without contrast is also suggested, as clinically warranted. Thick-walled bladder with indwelling Foley catheter and mild perivesical stranding, suggesting cystitis. Suspected ascending infection involving the bilateral proximal collecting systems. Possible filling defect in the right renal collecting system on delayed imaging raises concern for debris. Additional ancillary findings as above. Electronically Signed   By: Charline Bills M.D.   On: 06/26/2023 17:38   CT ABDOMEN PELVIS W CONTRAST Result Date: 06/26/2023 CLINICAL DATA:  Abdominal pain, lethargy EXAM: CT ANGIOGRAPHY CHEST CT ABDOMEN AND PELVIS WITH CONTRAST  TECHNIQUE: Multidetector CT imaging of the chest was performed using the standard protocol during bolus administration of intravenous contrast.  Multiplanar CT image reconstructions and MIPs were obtained to evaluate the vascular anatomy. Multidetector CT imaging of the abdomen and pelvis was performed using the standard protocol during bolus administration of intravenous contrast. RADIATION DOSE REDUCTION: This exam was performed according to the departmental dose-optimization program which includes automated exposure control, adjustment of the mA and/or kV according to patient size and/or use of iterative reconstruction technique. CONTRAST:  80mL OMNIPAQUE IOHEXOL 350 MG/ML SOLN COMPARISON:  CT abdomen/pelvis dated 01/01/2023 FINDINGS: CTA CHEST FINDINGS Cardiovascular: Status are opacification of the bilateral pulmonary arteries to the segmental level. No evidence of pulmonary embolism. Although not tailored for evaluation of the thoracic aorta, there is no evidence of thoracic aortic aneurysm or dissection. The heart is normal in size.  No pericardial effusion. Severe three-vessel coronary atherosclerosis. Mediastinum/Nodes: No suspicious mediastinal adenopathy. Visualized thyroid is unremarkable. Lungs/Pleura: Dependent atelectasis in the bilateral upper and lower lobes. No focal consolidation. No suspicious pulmonary nodules. No pleural effusion or pneumothorax. Musculoskeletal: Visualized osseous structures are within normal limits. Review of the MIP images confirms the above findings. CT ABDOMEN and PELVIS FINDINGS Hepatobiliary: Liver is within normal limits. Gallbladder is unremarkable. No intrahepatic or extrahepatic duct dilatation. Pancreas: Lesion along the inferior aspect of the proximal pancreatic tail, measuring at least 2.5 x 3.1 cm (series 4/image 29), new from prior, with suspected mild parenchymal atrophy of the distal pancreatic tail (series 4/image 31). Although poorly visualized/evaluated,  this raises concern for pancreatic adenocarcinoma. Focal pancreatitis is considered less likely. Spleen: Within normal limits. Adrenals/Urinary Tract: Adrenal glands are within normal limits. Kidneys are within normal limits. Mild to moderate right hydronephrosis with urothelial thickening. Mild left hydronephrosis with urothelial thickening. No renal calculi. On delayed imaging, there is an apparent 4 defect in the right renal collecting system (series 8/image 17), although without associated enhancing lesion on the arterial phase, possibly reflecting debris. Thick-walled bladder with indwelling Foley catheter and mild perivesical stranding (series 4/image 85), suggesting cystitis. Stomach/Bowel: Stomach is notable for a small hiatal hernia. No evidence of bowel obstruction. Normal appendix (series 4/image 48). No colonic wall thickening or inflammatory changes. Vascular/Lymphatic: No evidence of abdominal aortic aneurysm. Atherosclerotic calcifications of the abdominal aorta and branch vessels, although vessels remain patent. Celiac axis abuts the suspected lesion. No suspicious abdominopelvic lymphadenopathy. Reproductive: Prostate is unremarkable. Other: No abdominopelvic ascites. Musculoskeletal: Degenerative changes of the lumbar spine. Review of the MIP images confirms the above findings. IMPRESSION: No evidence of pulmonary embolism. 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 Focal pancreatitis is considered less likely. Consider EUS for tissue sampling. When patient is stable (without concerns of motion degradation), short-term follow-up MRI abdomen with/without contrast is also suggested, as clinically warranted. Thick-walled bladder with indwelling Foley catheter and mild perivesical stranding, suggesting cystitis. Suspected ascending infection involving the bilateral proximal collecting systems. Possible filling  defect in the right renal collecting system on delayed imaging raises concern for debris. Additional ancillary findings as above. Electronically Signed   By: Charline Bills M.D.   On: 06/26/2023 17:38   CT Head Wo Contrast Result Date: 06/26/2023 CLINICAL DATA:  Mental status change, unknown cause EXAM: CT HEAD WITHOUT CONTRAST TECHNIQUE: Contiguous axial images were obtained from the base of the skull through the vertex without intravenous contrast. RADIATION DOSE REDUCTION: This exam was performed according to the departmental dose-optimization program which includes automated exposure control, adjustment of the mA and/or kV according to patient size and/or  use of iterative reconstruction technique. COMPARISON:  CT Head 01/07/22 FINDINGS: Brain: No hemorrhage. No hydrocephalus. No extra-axial fluid collection. No CT evidence of an acute cortical infarct. No mass effect. No mass lesion. Vascular: No hyperdense vessel or unexpected calcification. Skull: Mild soft tissue swelling along the posterior scalp. Negative for fracture or focal lesion. Sinuses/Orbits: No middle ear or mastoid effusion. Paranasal sinuses are notable for mucosal thickening in the bilateral maxillary sinuses. Bilateral lens replacement. Orbits are otherwise unremarkable. Other: None. IMPRESSION: No acute intracranial abnormality. Electronically Signed   By: Lorenza Cambridge M.D.   On: 06/26/2023 16:42   DG Chest 2 View Result Date: 06/26/2023 CLINICAL DATA:  Lethargy. Altered mental status. Urinary tract infection. EXAM: CHEST - 2 VIEW COMPARISON:  01/07/2022 FINDINGS: Low lung volumes are again seen. Heart size is stable. No evidence of pulmonary infiltrate or pleural effusion. IMPRESSION: Low lung volumes. No active cardiopulmonary disease. Electronically Signed   By: Danae Orleans M.D.   On: 06/26/2023 15:19   US RENAL Result Date: 06/11/2023 : PROCEDURE: US RENAL HISTORY: Patient is a 72 y/o M with hydronephrosis. Left  ureteroscopy and cystoscopy 2022 COMPARISON: U/S renal 05/22/2023, 02/01/2022, abdomen 01/01/2023. TECHNIQUE: Two-dimensional grayscale and color Doppler ultrasound of the kidneys was performed. FINDINGS: This is a technically difficult and limited study due to the study being performed with the patient in the wheelchair. The urinary bladder is minimally distended. The right kidney measures 8.8 cm and is partially obscured by overlying bowel gas. There is fullness of the upper pole calyx. The left kidney measures 9.7 cm. Renal cortical echotexture is normal. There is fullness of the renal pelvis without hydronephrosis. There are no stones. There are no cysts. IMPRESSION: 1. Fullness of the left renal pelvis without hydronephrosis. This has improved when compared to the prior study. 2. Suboptimal visualization of the right kidney due to overlying bowel gas. Thank you for allowing [place] to assist in the care of this patient. Electronically Signed   By: Lestine Box M.D.   On: 06/11/2023 12:42    Micro Results   Recent Results (from the past 240 hours)  Urine Culture     Status: Abnormal   Collection Time: 06/26/23  2:00 PM   Specimen: Urine, Clean Catch  Result Value Ref Range Status   Specimen Description   Final    URINE, CLEAN CATCH Performed at [facility], [address] [place], [place] [address]    Special Requests   Final    NONE Reflexed from [id number] Performed at [facility], [address], [place], [place] [address]    Culture (A)  Final    >=100,000 COLONIES/mL PSEUDOMONAS AERUGINOSA 50,000 COLONIES/mL KLEBSIELLA AEROGENES    Report Status 06/29/2023 FINAL  Final   Organism ID, Bacteria PSEUDOMONAS AERUGINOSA (A)  Final   Organism ID, Bacteria KLEBSIELLA AEROGENES (A)  Final      Susceptibility   Pseudomonas aeruginosa - MIC*    CEFTAZIDIME 4 SENSITIVE Sensitive     CIPROFLOXACIN 0.5 SENSITIVE Sensitive     GENTAMICIN <=1 SENSITIVE Sensitive     IMIPENEM 1 SENSITIVE  Sensitive     CEFEPIME 4 SENSITIVE Sensitive     * >=100,000 COLONIES/mL PSEUDOMONAS AERUGINOSA   Klebsiella aerogenes - MIC*    CEFEPIME <=0.12 SENSITIVE Sensitive     CEFTRIAXONE <=0.25 SENSITIVE Sensitive     CIPROFLOXACIN <=0.25 SENSITIVE Sensitive     GENTAMICIN <=1 SENSITIVE Sensitive     IMIPENEM 1 SENSITIVE Sensitive  NITROFURANTOIN 64 INTERMEDIATE Intermediate     TRIMETH/SULFA <=20 SENSITIVE Sensitive     PIP/TAZO <=4 SENSITIVE Sensitive ug/mL    * 50,000 COLONIES/mL KLEBSIELLA AEROGENES  Blood Culture (routine x 2)     Status: Abnormal   Collection Time: 06/26/23  2:03 PM   Specimen: BLOOD  Result Value Ref Range Status   Specimen Description   Final    BLOOD RIGHT ASSIST CONTROL Performed at [facility], [address] [place], [place] [address]    Special Requests   Final    BOTTLES DRAWN AEROBIC AND ANAEROBIC Blood Culture adequate volume Performed at [facility], [address] [place], [place] [address]    Culture  Setup Time   Final    AEROBIC BOTTLE ONLY GRAM POSITIVE COCCI Gram Stain Report Called to,Read Back By and Verified With: JUDY BELL @1640  ON 06/27/23 C VARNER CRITICAL RESULT CALLED TO, READ BACK BY AND VERIFIED WITH: Perlie Mayo RN 06/28/2023 @ 0038 BY AB    Culture (A)  Final    STAPHYLOCOCCUS HAEMOLYTICUS THE SIGNIFICANCE OF ISOLATING THIS ORGANISM FROM A SINGLE SET OF BLOOD CULTURES WHEN MULTIPLE SETS ARE DRAWN IS UNCERTAIN. PLEASE NOTIFY THE MICROBIOLOGY DEPARTMENT WITHIN ONE WEEK IF SPECIATION AND SENSITIVITIES ARE REQUIRED. Performed at [facility], [address] [place], [place] [address]    Report Status 06/28/2023 FINAL  Final  Blood Culture ID Panel (Reflexed)     Status: Abnormal   Collection Time: 06/26/23  2:03 PM  Result Value Ref Range Status   Enterococcus faecalis NOT DETECTED NOT DETECTED Final   Enterococcus Faecium NOT DETECTED NOT DETECTED Final   Listeria monocytogenes NOT DETECTED NOT DETECTED Final    Staphylococcus species DETECTED (A) NOT DETECTED Final    Comment: CRITICAL RESULT CALLED TO, READ BACK BY AND VERIFIED WITH: Perlie Mayo RN 06/28/2023 @ 0038 BY AB    Staphylococcus aureus (BCID) NOT DETECTED NOT DETECTED Final   Staphylococcus epidermidis NOT DETECTED NOT DETECTED Final   Staphylococcus lugdunensis NOT DETECTED NOT DETECTED Final   Streptococcus species NOT DETECTED NOT DETECTED Final   Streptococcus agalactiae NOT DETECTED NOT DETECTED Final   Streptococcus pneumoniae NOT DETECTED NOT DETECTED Final   Streptococcus pyogenes NOT DETECTED NOT DETECTED Final   A.calcoaceticus-baumannii NOT DETECTED NOT DETECTED Final   Bacteroides fragilis NOT DETECTED NOT DETECTED Final   Enterobacterales NOT DETECTED NOT DETECTED Final   Enterobacter cloacae complex NOT DETECTED NOT DETECTED Final   Escherichia coli NOT DETECTED NOT DETECTED Final   Klebsiella aerogenes NOT DETECTED NOT DETECTED Final   Klebsiella oxytoca NOT DETECTED NOT DETECTED Final   Klebsiella pneumoniae NOT DETECTED NOT DETECTED Final   Proteus species NOT DETECTED NOT DETECTED Final   Salmonella species NOT DETECTED NOT DETECTED Final   Serratia marcescens NOT DETECTED NOT DETECTED Final   Haemophilus influenzae NOT DETECTED NOT DETECTED Final   Neisseria meningitidis NOT DETECTED NOT DETECTED Final   Pseudomonas aeruginosa NOT DETECTED NOT DETECTED Final   Stenotrophomonas maltophilia NOT DETECTED NOT DETECTED Final   Candida albicans NOT DETECTED NOT DETECTED Final   Candida auris NOT DETECTED NOT DETECTED Final   Candida glabrata NOT DETECTED NOT DETECTED Final   Candida krusei NOT DETECTED NOT DETECTED Final   Candida parapsilosis NOT DETECTED NOT DETECTED Final   Candida tropicalis NOT DETECTED NOT DETECTED Final   Cryptococcus neoformans/gattii NOT DETECTED NOT DETECTED Final    Comment: Performed at [facility], [address] [place], [place]  [address]  Blood Culture (routine x 2)      Status: None (Preliminary result)   Collection Time: 06/26/23  2:53 PM   Specimen: BLOOD  Result Value Ref Range Status   Specimen Description BLOOD rt wrist  Final   Special Requests   Final    BOTTLES DRAWN AEROBIC AND ANAEROBIC Blood Culture adequate volume   Culture   Final    NO GROWTH 3 DAYS Performed at [facility], [address] [place], [place] [address]    Report Status PENDING  Incomplete  Resp panel by RT-PCR (RSV, Flu A&B, Covid)     Status: None   Collection Time: 06/26/23  3:50 PM  Result Value Ref Range Status   SARS Coronavirus 2 by RT PCR NEGATIVE NEGATIVE Final    Comment: (NOTE) SARS-CoV-2 target nucleic acids are NOT DETECTED.  The SARS-CoV-2 RNA is generally detectable in upper respiratory specimens during the acute phase of infection. The lowest concentration of SARS-CoV-2 viral copies this assay can detect is 138 copies/mL. A negative result does not preclude SARS-Cov-2 infection and should not be used as the sole basis for treatment or other patient management decisions. A negative result may occur with  improper specimen collection/handling, submission of specimen other than nasopharyngeal swab, presence of viral mutation(s) within the areas targeted by this assay, and inadequate number of viral copies(<138 copies/mL). A negative result must be combined with clinical observations, patient history, and epidemiological information. The expected result is Negative.  Fact Sheet for Patients:  BloggerCourse.com  Fact Sheet for Healthcare Providers:  SeriousBroker.it  This test is no t yet approved or cleared by the [place] FDA and  has been authorized for detection and/or diagnosis of SARS-CoV-2 by FDA under an Emergency Use Authorization (EUA). This EUA will remain  in effect (meaning this test can be used) for the duration of the COVID-19 declaration under Section 564(b)(1) of the Act,  21 U.S.C.section 360bbb-3(b)(1), unless the authorization is terminated  or revoked sooner.       Influenza A by PCR NEGATIVE NEGATIVE Final   Influenza B by PCR NEGATIVE NEGATIVE Final    Comment: (NOTE) The Xpert Xpress SARS-CoV-2/FLU/RSV plus assay is intended as an aid in the diagnosis of influenza from Nasopharyngeal swab specimens and should not be used as a sole basis for treatment. Nasal washings and aspirates are unacceptable for Xpert Xpress SARS-CoV-2/FLU/RSV testing.  Fact Sheet for Patients: BloggerCourse.com  Fact Sheet for Healthcare Providers: SeriousBroker.it  This test is not yet approved or cleared by the [place] FDA and has been authorized for detection and/or diagnosis of SARS-CoV-2 by FDA under an Emergency Use Authorization (EUA). This EUA will remain in effect (meaning this test can be used) for the duration of the COVID-19 declaration under Section 564(b)(1) of the Act, 21 U.S.C. section 360bbb-3(b)(1), unless the authorization is terminated or revoked.     Resp Syncytial Virus by PCR NEGATIVE NEGATIVE Final    Comment: (NOTE) Fact Sheet for Patients: BloggerCourse.com  Fact Sheet for Healthcare Providers: SeriousBroker.it  This test is not yet approved or cleared by the [place] FDA and has been authorized for detection and/or diagnosis of SARS-CoV-2 by FDA under an Emergency Use Authorization (EUA). This EUA will remain in effect (meaning this test can be used) for the duration of the COVID-19 declaration under Section 564(b)(1) of the Act, 21 U.S.C. section 360bbb-3(b)(1), unless the authorization is terminated or revoked.  Performed at [facility], [address], [place],  [place] [address]   Urine Culture     Status: Abnormal   Collection Time: 06/26/23  5:12 PM   Specimen: Urine, Catheterized  Result Value Ref Range Status    Specimen Description   Final    URINE, CATHETERIZED Performed at [facility], [address] [place], [place] [address]    Special Requests   Final    NONE Reflexed from [id number] Performed at [facility], [address], [place], [place] [address]    Culture 70,000 COLONIES/mL PSEUDOMONAS AERUGINOSA (A)  Final   Report Status 06/29/2023 FINAL  Final   Organism ID, Bacteria PSEUDOMONAS AERUGINOSA (A)  Final      Susceptibility   Pseudomonas aeruginosa - MIC*    CEFTAZIDIME 4 SENSITIVE Sensitive     CIPROFLOXACIN 0.5 SENSITIVE Sensitive     GENTAMICIN <=1 SENSITIVE Sensitive     IMIPENEM 1 SENSITIVE Sensitive     CEFEPIME 4 SENSITIVE Sensitive     * 70,000 COLONIES/mL PSEUDOMONAS AERUGINOSA  MRSA Next Gen by PCR, Nasal     Status: None   Collection Time: 06/26/23 10:14 PM   Specimen: Nasal Mucosa; Nasal Swab  Result Value Ref Range Status   MRSA by PCR Next Gen NOT DETECTED NOT DETECTED Final    Comment: (NOTE) The GeneXpert MRSA Assay (FDA approved for NASAL specimens only), is one component of a comprehensive MRSA colonization surveillance program. It is not intended to diagnose MRSA infection nor to guide or monitor treatment for MRSA infections. Test performance is not FDA approved in patients less than 18 years old. Performed at [facility], [address], [place], [place] [address]     Today   Subjective    Jacob Fowler today has no new complaints  -Wife and daughter at bedside No fever  Or chills            Patient has been seen and examined prior to discharge   Objective   Blood pressure 127/77, pulse 74, temperature 98.2 F (36.8 C), temperature source Oral, resp. rate 20, height 5\' 6"  (1.676 m), weight 75.7 kg, SpO2 95%.   Intake/Output Summary (Last 24 hours) at 06/29/2023 1905 Last data filed at 06/29/2023 0500 Gross per 24 hour  Intake --  Output 1000 ml  Net -1000 ml    Exam Gen:- Awake Alert,  in no apparent distress  HEENT:-  [place].AT, No sclera icterus Neck-Supple Neck,No JVD,.  Lungs-  CTAB , fair symmetrical air movement CV- S1, S2 normal, regular  Abd-  +ve B.Sounds, Abd Soft, No tenderness, no CVA area tenderness Extremity/Skin:- No  edema, pedal pulses present  Psych-affect is appropriate, oriented x3 Neuro-chronic neuromuscular deficits consistent with underlying multiple sclerosis, no additional new focal deficits, no tremors GU-Foley catheter with clear urine   Data Review   CBC w Diff:  Lab Results  Component Value Date   WBC 9.7 06/28/2023   HGB 10.9 (L) 06/28/2023   HGB 11.2 (L) 04/16/2023   HCT 34.4 (L) 06/28/2023   HCT 35.9 (L) 04/16/2023   PLT 167 06/28/2023   PLT 333 04/16/2023   LYMPHOPCT 4 06/26/2023   MONOPCT 10 06/26/2023   EOSPCT 0 06/26/2023   BASOPCT 0 06/26/2023    CMP:  Lab Results  Component Value Date   NA 133 (L) 06/28/2023   NA 129 (L) 04/16/2023   K 4.1 06/28/2023   CL 106 06/28/2023   CO2 18 (L) 06/28/2023   BUN 20 06/28/2023   BUN 36 (H)  04/16/2023   CREATININE 1.61 (H) 06/28/2023   CREATININE 1.13 01/06/2013   PROT 7.7 06/26/2023   PROT 7.4 04/16/2023   ALBUMIN 3.5 06/26/2023   ALBUMIN 4.2 04/16/2023   BILITOT 0.8 06/26/2023   BILITOT <0.2 04/16/2023   ALKPHOS 149 (H) 06/26/2023   AST 27 06/26/2023   ALT 24 06/26/2023  .  Total Discharge time is about 33 minutes  Shon Hale M.D on 06/29/2023 at 7:05 PM  Go to [email or web address] -  for contact info  [facility] - Office  [phone]

## 2023-06-29 NOTE — Discharge Instructions (Addendum)
1)Buttock Wound Care :- A)Apply Thin layer of Desitin  (Zinc Oxide) 2 times daily and as additional application needed for soiling- and then cover with silicone foam or ABD pad whichever is preferred.  B)Please apply Medihoney daily to necrotic areas-cover with dry gauze  C)Please Turn Patient and Change positions every couple of hours--Avoid laying or sitting on your Buttocks s for longer than a couple of hours at a time  2) continue to change Foley catheter at least once every month  3)Avoid excessive plain water intake----as discussed to avoid low sodium  4)Repeat CBC and BMP blood test in about 7 to 10 days with primary care physician advised  5)Please follow-up with Urologist Dr. Ronne Binning in about --- in 3 to 4 weeks for recheck and follow-up evaluation  in his office----[facility], [address], [place] [address] Phone Number----[phone]  6)Pancreatic Lesion----short-term follow-up MRI abdomen with/without contrast is also suggested patient and if MRI/MRCP findings are concerning patient will need EGD with EUS for tissue sampling

## 2023-06-29 NOTE — Inpatient Diabetes Management (Signed)
Inpatient Diabetes Program Recommendations  AACE/ADA: New Consensus Statement on Inpatient Glycemic Control (2015)  Target Ranges:  Prepandial:   less than 140 mg/dL      Peak postprandial:   less than 180 mg/dL (1-2 hours)      Critically ill patients:  140 - 180 mg/dL   Lab Results  Component Value Date   GLUCAP 285 (H) 06/29/2023   HGBA1C 8.6 (H) 04/16/2023    Review of Glycemic Control  Latest Reference Range & Units 06/28/23 16:47 06/28/23 20:19 06/29/23 00:00 06/29/23 04:25 06/29/23 07:49 06/29/23 11:11  Glucose-Capillary 70 - 99 mg/dL 161 (H) 096 (H) 045 (H) 170 (H) 159 (H) 285 (H)   Diabetes history: DM 2 Outpatient Diabetes medications:  Lantus 55 units daily Ozempic 0.5 mg weekly Current orders for Inpatient glycemic control:  Novolog moderate q 4 hours Semglee 30 units daily Inpatient Diabetes Program Recommendations:    Consider changing Novolog correction to tid with meals and HS.   Also consider increasing Semglee to 40 units daily and add Novolog meal coverage 3 units tid with meals (hold if patient eats less than 50%).   Thanks,  Beryl Meager, RN, BC-ADM Inpatient Diabetes Coordinator Pager [phone]  (8a-5p)

## 2023-06-29 NOTE — TOC Transition Note (Signed)
Transition of Care [facility]) - Discharge Note   Patient Details  Name: Jacob Fowler MRN: [id number] Date of Birth: 1951/01/22  [facility] CM/SW Contact:  Isabella Bowens, LCSWA Phone Number: 06/29/2023, 1:35 PM   Clinical Narrative:    Patient is ready for DC. CSW spoke with daughter and shared PT recommendation for [facility]. CSW spoke with daughter Mitzi and she was agreeable for [facility] to provide [facility] services in the home. Cory from [facility] contacted and agreed to accept referral for [facility]. TOC signing off.    Final next level of care: Home w Home Health Services Barriers to Discharge: Barriers Resolved   Patient Goals and CMS Choice Patient states their goals for this hospitalization and ongoing recovery are:: return home CMS Medicare.gov Compare Post Acute Care list provided to:: Patient Choice offered to / list presented to : Adult Children      Discharge Placement      Name of family member notified: Mitzi Birdie Riddle - daughter    Discharge Plan and Services Additional resources added to the After Visit Summary for       [facility] Arranged: RN [facility] Agency: [facility] Date [facility] Agency Contacted: 06/29/23 Time HH Agency Contacted: 1334 Representative spoke with at [facility] Agency: Kandee Keen  Social Drivers of Health (SDOH) Interventions SDOH Screenings   Food Insecurity: No Food Insecurity (06/26/2023)  Housing: Low Risk  (06/26/2023)  Transportation Needs: No Transportation Needs (06/26/2023)  Utilities: Not At Risk (06/26/2023)  Alcohol Screen: Low Risk  (11/23/2022)  Depression (PHQ2-9): Low Risk  (04/16/2023)  Financial Resource Strain: Low Risk  (11/23/2022)  Physical Activity: Insufficiently Active (11/23/2022)  Social Connections: Moderately Integrated (11/23/2022)  Stress: No Stress Concern Present (11/23/2022)  Tobacco Use: High Risk (06/26/2023)     Readmission Risk Interventions    12/03/2020    9:38 AM  Readmission Risk Prevention Plan   Transportation Screening Complete  Home Care Screening Complete  Medication Review (RN CM) Complete

## 2023-06-29 NOTE — Consult Note (Signed)
WOC reconsulted for buttocks/sacrum.  Review of photo continues with necrotic tissue to bilateral buttocks (most significant L mid buttock) as well as small scattered areas to sacrum.  Skin of buttocks and posterior thighs has appearance of chronic tissue damage with dark dusky discoloration. Patient appears to be wheelchair bound at baseline.    Will continue with Medihoney to necrotic areas.  DC Gerhardts and have bedside nurse place a thin layer of Desitin 2 times daily and as needed for soiling.  Patient should be placed on a low air loss mattress for pressure redistribution and moisture management.   POC discussed with bedside nurse and primary MD. WOC team will not follow. Re-consult if further needs arise.   Thank you,    Priscella Mann MSN, RN-BC, [employer] [phone]

## 2023-07-01 LAB — CULTURE, BLOOD (ROUTINE X 2)
Culture: NO GROWTH
Special Requests: ADEQUATE

## 2023-07-02 ENCOUNTER — Telehealth: Payer: Self-pay | Admitting: *Deleted

## 2023-07-02 NOTE — Transitions of Care (Post Inpatient/ED Visit) (Signed)
07/02/2023  Name: Jacob Fowler MRN: [id number] DOB: [birth year]  Today's TOC FU Call Status: Today's TOC FU Call Status:: Successful TOC FU Call Completed TOC FU Call Complete Date: 07/02/23 Patient's Name and Date of Birth confirmed.  Transition Care Management Follow-up Telephone Call Date of Discharge: 06/29/23 [facility] Type of Discharge: Inpatient Admission Primary Inpatient Discharge Diagnosis:: acute right-sided epididymoorchitis How have you been since you were released from the hospital?:  (pt eating well, bowel movements QOD, foley cath patent w/ no issues, spouse providing wound care, pt states "I do feel much better") Any questions or concerns?: No  Items Reviewed: Did you receive and understand the discharge instructions provided?: Yes Medications obtained,verified, and reconciled?: Yes (Medications Reviewed) Any new allergies since your discharge?: No Dietary orders reviewed?: Yes Type of Diet Ordered:: heart healthy, carbohydrate modified Do you have support at home?: Yes People in Home: spouse Name of Support/Comfort Primary Source: spouse Jacob Fowler   Goals Addressed             This Visit's Progress    Transition of Care/ pt will have no readmissions within 30 days       Current Barriers:  Chronic Disease Management support and education needs related to DMII and acute epididymoorchitis  Spoke with patient and spouse Jacob Fowler who reports patient is doing much better, has all medications including antibiotic and taking as prescribed Foley catheter is changed monthly at urologist [facility] home health has not contact pt, RN CM will notify Spouse and daughter is providing wound care, states " it looks good, no drainage" to buttocks area, spouse is trying to make sure positions changed q 2 hours, patient is eating well, pt is in wheelchair at all times, spouse assists with transfers, bathing, etc.  RNCM Clinical Goal(s):   Patient will work with the Care Management team over the next 30 days to address Transition of Care Barriers: Home Health services verbalize understanding of plan for management of DMII and acute epididymoorchitis as evidenced by patient/ spouse report, review of EMR and  through collaboration with RN Care manager, provider, and care team.   Interventions: Acute epididymoorchitis: Evaluation of current treatment plan related to  self management and patient's adherence to plan as established by provider Reviewed signs/ symptoms of infection including UTI and reportable signs/ symptoms Reviewed importance of foley catheter care/ maintenance Reviewed per discharge instructions- avoid excessive plain water intake due to low sodium Reviewed adequate protein intake at each meal Reviewed changing positions q 2 hours Spoke with Jacob Fowler at [place] confirmed they do have orders and waiting on insurance authorization, will check on this today and hopefully be able to see pt by tomorrow  Diabetes Interventions:  (Status:  New goal. and Goal on track:  Yes.) Short Term Goal Assessed patient's understanding of A1c goal: <7% Provided education to patient about basic DM disease process Reviewed medications with patient and discussed importance of medication adherence Counseled on importance of regular laboratory monitoring as prescribed Discussed plans with patient for ongoing care management follow up and provided patient with direct contact information for care management team Review of patient status, including review of consultants reports, relevant laboratory and other test results, and medications completed Assessed social determinant of health barriers Lab Results  Component Value Date   HGBA1C 8.6 (H) 04/16/2023    Patient Goals/Self-Care Activities: Participate in Transition of Care Program/Attend [facility] scheduled calls Notify RN Care Manager of TOC call rescheduling needs Take all medications  as  prescribed Attend all scheduled provider appointments Call pharmacy for medication refills 3-7 days in advance of running out of medications check blood sugar at prescribed times: once daily check feet daily for cuts, sores or redness enter blood sugar readings and medication or insulin into daily log take the blood sugar log to all doctor visits trim toenails straight across limit fast food meals to no more than 1 per week wash and dry feet carefully every day wear comfortable, cotton socks wear comfortable, well-fitting shoes [facility] will contact you and set up a visit   [phone] Per our discussion please schedule a sooner appointment with primary care provider and [facility] in [place] Change positions q 2 hours  Follow Up Plan:  The patient has been provided with contact information for the care management team and has been advised to call with any health related questions or concerns.           Medications Reviewed Today: Medications Reviewed Today     Reviewed by Jacob Gallus, RN (Registered Nurse) on 07/02/23 at 1018  Med List Status: <None>   Medication Order Taking? Sig Documenting Provider Last Dose Status Informant  acetaminophen (TYLENOL) 325 MG tablet [id number] Yes Take 2 tablets (650 mg total) by mouth every 6 (six) hours as needed for mild pain (pain score 1-3) (or Fever >/= 101). Shon Hale, MD Taking Active   Ascorbic Acid (VITAMIN C PO) [id number] Yes Take 500 mg by mouth in the morning and at bedtime. [provider] Taking Active Family Member  aspirin 81 MG EC tablet [id number] Yes Take 81 mg by mouth daily. [provider] Taking Active Family Member    Discontinued 09/27/11 1007 (Error)   Cholecalciferol (VITAMIN D) 50 MCG (2000 UT) tablet [id number] Yes Take 2,000 Units by mouth daily. [provider] Taking Active Self, Family Member  ciprofloxacin (CIPRO) 500 MG tablet [id number] Yes Take 1 tablet (500  mg total) by mouth 2 (two) times daily for 10 days. Shon Hale, MD Taking Active   cyanocobalamin (VITAMIN B12) 1000 MCG/ML injection [id number] Yes INJECT INTO THE SKIN ONCE A MONTH Daphine Deutscher, Mary-Margaret, FNP Taking Active Family Member  ferrous sulfate 325 (65 FE) MG tablet [id number] Yes Take 1 tablet (325 mg total) by mouth daily with breakfast. Bennie Pierini, FNP Taking Active Family Member  finasteride (PROSCAR) 5 MG tablet [id number] Yes Take 1 tablet (5 mg total) by mouth daily. Bennie Pierini, FNP Taking Active Family Member  glucose blood test strip [id number] Yes Use as instructed Ernestina Penna, MD Taking Active Family Member  insulin glargine (LANTUS SOLOSTAR) 100 UNIT/ML Solostar Pen [id number] Yes Inject 55 Units into the skin daily. Bennie Pierini, FNP Taking Active Family Member  Insulin Pen Needle 31G X 5 MM MISC [id number] Yes Use with lantus pen as directed Bennie Pierini, FNP Taking Active Family Member  isosorbide mononitrate (IMDUR) 60 MG 24 hr tablet [id number] Yes TAKE 1/2 TABLET (30MG  TOTAL) BY MOUTH DAILY Bennie Pierini, FNP Taking Active Family Member  leptosperum manuka honey (THERAHONEY) GEL gel [id number] Yes Apply 1 Application topically daily. Apply daily to necrotic areas of Buttocks Emokpae, Courage, MD Taking Active   levothyroxine (SYNTHROID) 88 MCG tablet [id number] Yes Take 1 tablet (88 mcg total) by mouth daily. Bennie Pierini, FNP Taking Active Family Member  liver oil-zinc oxide (DESITIN) 40 % ointment [id number] Yes Apply topically 2 (two) times daily. Apply Thin layer of Desitin  (Zinc Oxide) 2 times daily and  as additional application needed for soiling. Shon Hale, MD Taking Active   magnesium oxide (MAG-OX) 400 MG tablet [id number] Yes Take 1 tablet by mouth daily. [provider] Taking Active Family Member    Discontinued 09/27/11 1008 (Error)   olmesartan (BENICAR) 5 MG tablet [id number] Yes Take 1  tablet (5 mg total) by mouth daily. Bennie Pierini, FNP Taking Active Family Member  omeprazole (PRILOSEC) 20 MG capsule [id number] Yes TAKE ONE CAPSULE (20MG  TOTAL) BY MOUTH DAILY Daphine Deutscher Mary-Margaret, FNP Taking Active Family Member  rosuvastatin (CRESTOR) 10 MG tablet [id number] Yes Take 1 tablet (10 mg total) by mouth daily. Bennie Pierini, FNP Taking Active Family Member  Semaglutide,0.25 or 0.5MG /DOS, (OZEMPIC, 0.25 OR 0.5 MG/DOSE,) 2 MG/3ML SOPN [id number] Yes Inject 0.25mg  into skin weekly for 4 weeks, then increase to 0.5mg  weekly thereafter. DX:E11.65 Bennie Pierini, FNP Taking Active Family Member  silodosin (RAPAFLO) 8 MG CAPS capsule [id number] Yes Take 1 capsule (8 mg total) by mouth at bedtime. Daphine Deutscher Mary-Margaret, FNP Taking Active Family Member  sodium bicarbonate 650 MG tablet [id number] Yes Take 650 mg by mouth 3 (three) times daily. [provider] Taking Active Family Member  sodium citrate-citric acid (ORACIT) 500-334 MG/5ML solution [id number] Yes Take 15 mLs by mouth 2 (two) times daily. [provider] Taking Active Family Member  SPS, SODIUM POLYSTYRENE SULF, 15 GM/60ML suspension [id number] Yes Take 15 mLs by mouth in the morning and at bedtime. [provider] Taking Active Family Member            Home Care and Equipment/Supplies: Were Home Health Services Ordered?: Yes Name of Home Health Agency:: [facility] Has Agency set up a time to come to your home?: No EMR reviewed for Home Health Orders: Orders present/patient has not received call (refer to CM for follow-up) (t/c to [facility], spoke with Jacob Fowler, they do have orders and will begin services as soon as authorization from insurance will hopefully be today and will call and set up visit not later than tomorrow) Any new equipment or medical supplies ordered?: No  Functional Questionnaire: Do you need assistance with bathing/showering or dressing?: Yes (has shower  seat and family assists) Do you need assistance with meal preparation?: Yes Do you need assistance with eating?: No Do you have difficulty maintaining continence: Yes (has foley catheter) Do you need assistance with getting out of bed/getting out of a chair/moving?: Yes (uses WC at all times) Do you have difficulty managing or taking your medications?: Yes  Follow up appointments reviewed: PCP Follow-up appointment confirmed?: Yes Date of PCP follow-up appointment?:  (spouse is calling today to schedule a sooner f/u appointment) Follow-up Provider: Paulene Floor FNP, spouse is calling today to make a sooner appointment Specialist Hospital Follow-up appointment confirmed?: No Reason Specialist Follow-Up Not Confirmed: Patient has Specialist Provider Number and will Call for Appointment (spouse states she is calling today to schedule w/ [facility] in [place]) Do you need transportation to your follow-up appointment?: No (spouse and daughter provide transportation) Do you understand care options if your condition(s) worsen?: Yes-patient verbalized understanding  SDOH Interventions Today    Flowsheet Row Most Recent Value  SDOH Interventions   Food Insecurity Interventions Intervention Not Indicated  Housing Interventions Intervention Not Indicated  Transportation Interventions Intervention Not Indicated  Utilities Interventions Intervention Not Indicated       Jacob Shows [facility], BSN RN Care Manager/ Transition of Care [place]/ [facility] Population Health [phone]

## 2023-07-03 DIAGNOSIS — K8689 Other specified diseases of pancreas: Secondary | ICD-10-CM | POA: Diagnosis not present

## 2023-07-03 DIAGNOSIS — K219 Gastro-esophageal reflux disease without esophagitis: Secondary | ICD-10-CM | POA: Diagnosis not present

## 2023-07-03 DIAGNOSIS — A4101 Sepsis due to Methicillin susceptible Staphylococcus aureus: Secondary | ICD-10-CM | POA: Diagnosis not present

## 2023-07-03 DIAGNOSIS — E1159 Type 2 diabetes mellitus with other circulatory complications: Secondary | ICD-10-CM | POA: Diagnosis not present

## 2023-07-03 DIAGNOSIS — N433 Hydrocele, unspecified: Secondary | ICD-10-CM | POA: Diagnosis not present

## 2023-07-03 DIAGNOSIS — I4510 Unspecified right bundle-branch block: Secondary | ICD-10-CM | POA: Diagnosis not present

## 2023-07-03 DIAGNOSIS — N486 Induration penis plastica: Secondary | ICD-10-CM | POA: Diagnosis not present

## 2023-07-03 DIAGNOSIS — S31829D Unspecified open wound of left buttock, subsequent encounter: Secondary | ICD-10-CM | POA: Diagnosis not present

## 2023-07-03 DIAGNOSIS — G9341 Metabolic encephalopathy: Secondary | ICD-10-CM | POA: Diagnosis not present

## 2023-07-03 DIAGNOSIS — N453 Epididymo-orchitis: Secondary | ICD-10-CM | POA: Diagnosis not present

## 2023-07-03 DIAGNOSIS — E7800 Pure hypercholesterolemia, unspecified: Secondary | ICD-10-CM | POA: Diagnosis not present

## 2023-07-03 DIAGNOSIS — R351 Nocturia: Secondary | ICD-10-CM | POA: Diagnosis not present

## 2023-07-03 DIAGNOSIS — N210 Calculus in bladder: Secondary | ICD-10-CM | POA: Diagnosis not present

## 2023-07-03 DIAGNOSIS — A4150 Gram-negative sepsis, unspecified: Secondary | ICD-10-CM | POA: Diagnosis not present

## 2023-07-03 DIAGNOSIS — I152 Hypertension secondary to endocrine disorders: Secondary | ICD-10-CM | POA: Diagnosis not present

## 2023-07-03 DIAGNOSIS — E1169 Type 2 diabetes mellitus with other specified complication: Secondary | ICD-10-CM | POA: Diagnosis not present

## 2023-07-03 DIAGNOSIS — N390 Urinary tract infection, site not specified: Secondary | ICD-10-CM | POA: Diagnosis not present

## 2023-07-03 DIAGNOSIS — T83511A Infection and inflammatory reaction due to indwelling urethral catheter, initial encounter: Secondary | ICD-10-CM | POA: Diagnosis not present

## 2023-07-03 DIAGNOSIS — I2510 Atherosclerotic heart disease of native coronary artery without angina pectoris: Secondary | ICD-10-CM | POA: Diagnosis not present

## 2023-07-03 DIAGNOSIS — N138 Other obstructive and reflux uropathy: Secondary | ICD-10-CM | POA: Diagnosis not present

## 2023-07-03 DIAGNOSIS — N401 Enlarged prostate with lower urinary tract symptoms: Secondary | ICD-10-CM | POA: Diagnosis not present

## 2023-07-03 DIAGNOSIS — G35 Multiple sclerosis: Secondary | ICD-10-CM | POA: Diagnosis not present

## 2023-07-03 DIAGNOSIS — A4152 Sepsis due to Pseudomonas: Secondary | ICD-10-CM | POA: Diagnosis not present

## 2023-07-03 DIAGNOSIS — L719 Rosacea, unspecified: Secondary | ICD-10-CM | POA: Diagnosis not present

## 2023-07-04 ENCOUNTER — Ambulatory Visit: Payer: PPO | Admitting: Urology

## 2023-07-09 ENCOUNTER — Other Ambulatory Visit: Payer: Self-pay | Admitting: *Deleted

## 2023-07-09 DIAGNOSIS — S31829A Unspecified open wound of left buttock, initial encounter: Secondary | ICD-10-CM | POA: Diagnosis not present

## 2023-07-09 NOTE — Patient Outreach (Signed)
Care Management  Transitions of Care Program Transitions of Care Post-discharge week 2   07/09/2023 Name: Jacob Fowler MRN: [id number] DOB: 09-Dec-1950  Subjective: Jacob Fowler is a 72 y.o. year old male who is a primary care patient of Bennie Pierini, FNP. The Care Management team Engaged with patient Engaged with patient by telephone to assess and address transitions of care needs.   Consent to Services:  Patient/ caregiver previously consented to services  Assessment: Patient/ spouse report doing well, has primary care and urology appointment scheduled, no issues with foley catheter, continues checking CBG daily with no issues reported, pt finished antibiotic today and has been eating greek yogurt and Activia         SDOH Interventions    Flowsheet Row Telephone from 07/02/2023 in [place] POPULATION HEALTH DEPARTMENT Clinical Support from 11/23/2022 in [facility] Telephone from 06/20/2022 in [facility] Community Care Coordination Patient Outreach Telephone from 03/15/2022 in [facility] Community Care Coordination  SDOH Interventions      Food Insecurity Interventions Intervention Not Indicated Intervention Not Indicated Intervention Not Indicated Intervention Not Indicated  Housing Interventions Intervention Not Indicated Intervention Not Indicated -- --  Transportation Interventions Intervention Not Indicated Intervention Not Indicated Intervention Not Indicated  [Family provides transportation] Intervention Not Indicated  Utilities Interventions Intervention Not Indicated -- -- --  Alcohol Usage Interventions -- Intervention Not Indicated (Score <7) -- --  Financial Strain Interventions -- Intervention Not Indicated -- --  Physical Activity Interventions -- Intervention Not Indicated -- --  Stress Interventions -- Intervention Not Indicated -- --  Social Connections Interventions --  Intervention Not Indicated -- --        Goals Addressed             This Visit's Progress    Transition of Care/ pt will have no readmissions within 30 days       Current Barriers:  Chronic Disease Management support and education needs related to DMII and acute epididymoorchitis  Spoke with patient and spouse Jacob Fowler who reports patient is doing much better, has all medications including antibiotic and taking as prescribed Foley catheter is changed monthly at urologist [facility] is working with patient Spouse and daughter is providing wound care, states " it looks good, no drainage" to buttocks area, spouse is trying to make sure positions changed q 2 hours, patient is eating well, pt is in wheelchair at all times, spouse assists with transfers, bathing, etc.  RNCM Clinical Goal(s):  Patient will work with the Care Management team over the next 30 days to address Transition of Care Barriers: Home Health services verbalize understanding of plan for management of DMII and acute epididymoorchitis as evidenced by patient/ spouse report, review of EMR and  through collaboration with RN Care manager, provider, and care team.   Interventions: Acute epididymoorchitis: Evaluation of current treatment plan related to  self management and patient's adherence to plan as established by provider Reinforced signs/ symptoms of infection including UTI and reportable signs/ symptoms Reinforced importance of foley catheter care/ maintenance Reinforced per discharge instructions- avoid excessive plain water intake due to low sodium Reinforced adequate protein intake at each meal Reinforced changing positions q 2 hours Encouraged patient to continue working with home health Reviewed upcoming scheduled appointments  12/26 @ 320 pm primary care provider,  07/29/22 @ urologist  Diabetes Interventions:  (Status:  New goal. and Goal on track:  Yes.) Short Term Goal Assessed patient's  understanding  of A1c goal: <7% Reviewed medications with patient and discussed importance of medication adherence Counseled on importance of regular laboratory monitoring as prescribed Discussed plans with patient for ongoing care management follow up and provided patient with direct contact information for care management team Review of patient status, including review of consultants reports, relevant laboratory and other test results, and medications completed Lab Results  Component Value Date   HGBA1C 8.6 (H) 04/16/2023    Patient Goals/Self-Care Activities: Participate in Transition of Care Program/Attend TOC scheduled calls Notify RN Care Manager of TOC call rescheduling needs Take all medications as prescribed Attend all scheduled provider appointments Call pharmacy for medication refills 3-7 days in advance of running out of medications check blood sugar at prescribed times: once daily check feet daily for cuts, sores or redness enter blood sugar readings and medication or insulin into daily log take the blood sugar log to all doctor visits trim toenails straight across limit fast food meals to no more than 1 per week wash and dry feet carefully every day wear comfortable, cotton socks wear comfortable, well-fitting shoes Continue working with [facility]-  [phone] Change positions q 2 hours  Follow Up Plan:  The patient has been provided with contact information for the care management team and has been advised to call with any health related questions or concerns.   Telephone outreach scheduled for 07/16/23 @ 9 am        Plan: Telephone follow up appointment with care management team member scheduled for:  07/16/23 @ 9 am  Irving Shows [facility], BSN RN Care Manager/ Transition of Care [place]/ [facility] Population Health [phone]

## 2023-07-09 NOTE — Patient Instructions (Signed)
Visit Information  Thank you for taking time to visit with me today. Please don't hesitate to contact me if I can be of assistance to you before our next scheduled telephone appointment.  Following are the goals we discussed today:   Goals Addressed             This Visit's Progress    Transition of Care/ pt will have no readmissions within 30 days       Current Barriers:  Chronic Disease Management support and education needs related to DMII and acute epididymoorchitis  Spoke with patient and spouse Steward Drone who reports patient is doing much better, has all medications including antibiotic and taking as prescribed Foley catheter is changed monthly at urologist [facility] home health is working with patient Spouse and daughter is providing wound care, states " it looks good, no drainage" to buttocks area, spouse is trying to make sure positions changed q 2 hours, patient is eating well, pt is in wheelchair at all times, spouse assists with transfers, bathing, etc.  RNCM Clinical Goal(s):  Patient will work with the Care Management team over the next 30 days to address Transition of Care Barriers: Home Health services verbalize understanding of plan for management of DMII and acute epididymoorchitis as evidenced by patient/ spouse report, review of EMR and  through collaboration with RN Care manager, provider, and care team.   Interventions: Acute epididymoorchitis: Evaluation of current treatment plan related to  self management and patient's adherence to plan as established by provider Reinforced signs/ symptoms of infection including UTI and reportable signs/ symptoms Reinforced importance of foley catheter care/ maintenance Reinforced per discharge instructions- avoid excessive plain water intake due to low sodium Reinforced adequate protein intake at each meal Reinforced changing positions q 2 hours Encouraged patient to continue working with home health Reviewed upcoming scheduled  appointments  12/26 @ 320 pm primary care provider,  07/29/22 @ urologist  Diabetes Interventions:  (Status:  New goal. and Goal on track:  Yes.) Short Term Goal Assessed patient's understanding of A1c goal: <7% Reviewed medications with patient and discussed importance of medication adherence Counseled on importance of regular laboratory monitoring as prescribed Discussed plans with patient for ongoing care management follow up and provided patient with direct contact information for care management team Review of patient status, including review of consultants reports, relevant laboratory and other test results, and medications completed Lab Results  Component Value Date   HGBA1C 8.6 (H) 04/16/2023    Patient Goals/Self-Care Activities: Participate in Transition of Care Program/Attend [facility] scheduled calls Notify RN Care Manager of TOC call rescheduling needs Take all medications as prescribed Attend all scheduled provider appointments Call pharmacy for medication refills 3-7 days in advance of running out of medications check blood sugar at prescribed times: once daily check feet daily for cuts, sores or redness enter blood sugar readings and medication or insulin into daily log take the blood sugar log to all doctor visits trim toenails straight across limit fast food meals to no more than 1 per week wash and dry feet carefully every day wear comfortable, cotton socks wear comfortable, well-fitting shoes Continue working with [facility]-  [phone] Change positions q 2 hours  Follow Up Plan:  The patient has been provided with contact information for the care management team and has been advised to call with any health related questions or concerns.   Telephone outreach scheduled for 07/16/23 @ 9 am  Our next appointment is by telephone on 07/16/23 at 9 am  Please call the care guide team at [phone] if you need to cancel or reschedule your appointment.    If you are experiencing a Mental Health or Behavioral Health Crisis or need someone to talk to, please call the Suicide and Crisis Lifeline: 988 call the [place] National Suicide Prevention Lifeline: 609-226-7456 or TTY: 613-074-8450 TTY (918)304-6084) to talk to a trained counselor call 1-800-273-TALK (toll free, 24 hour hotline) go to [facility] [address], [place] [phone]) call the [facility] Crisis Line: 6080862074 call 911   Patient verbalizes understanding of instructions and care plan provided today and agrees to view in MyChart. Active MyChart status and patient understanding of how to access instructions and care plan via MyChart confirmed with patient.     Irving Shows [facility], BSN RN Care Manager/ Transition of Care [place]/ [facility] [phone]

## 2023-07-12 ENCOUNTER — Ambulatory Visit: Payer: PPO | Admitting: Family Medicine

## 2023-07-12 ENCOUNTER — Encounter: Payer: Self-pay | Admitting: Family Medicine

## 2023-07-12 VITALS — BP 100/68 | HR 90 | Temp 98.5°F | Ht 66.0 in | Wt 160.0 lb

## 2023-07-12 DIAGNOSIS — K869 Disease of pancreas, unspecified: Secondary | ICD-10-CM

## 2023-07-12 DIAGNOSIS — G35 Multiple sclerosis: Secondary | ICD-10-CM | POA: Diagnosis not present

## 2023-07-12 DIAGNOSIS — N453 Epididymo-orchitis: Secondary | ICD-10-CM

## 2023-07-12 DIAGNOSIS — S31819D Unspecified open wound of right buttock, subsequent encounter: Secondary | ICD-10-CM | POA: Diagnosis not present

## 2023-07-12 DIAGNOSIS — A419 Sepsis, unspecified organism: Secondary | ICD-10-CM | POA: Diagnosis not present

## 2023-07-12 DIAGNOSIS — Z978 Presence of other specified devices: Secondary | ICD-10-CM

## 2023-07-12 NOTE — Progress Notes (Signed)
Subjective:  [name], male    DOB: [birth year], 72 y.o.   MRN: [id number]  Patient Care Team: Bennie Pierini, FNP as PCP - General (Family Medicine) Sater, Pearletha Furl, MD (Neurology) Rollene Rotunda, MD as Consulting Physician (Cardiology) Delora Fuel, [place] (Optometry) McKenzie, Mardene Celeste, MD as Consulting Physician (Urology) Audrie Gallus, RN as [facility] Care Management   Chief Complaint:  Hospitalization Follow-up (Sepsis secondary to acute right-sided epididymoorchitis)   HPI: Jacob Fowler is a 72 y.o. male presenting on 07/12/2023 for Hospitalization Follow-up (Sepsis secondary to acute right-sided epididymoorchitis) Patient is a 72 y.o. male with PMH of multiple sclerosis, BPH, diabetes mellitus, hypertension, hypothyroidism, chronic indwelling Foley catheter. He was taken to ED on 06/26/23 for increased lethargy and AMS. He is cared for by his daughter and spouse who are present today and provide history. At time of hospital admission he was found to have greenish urin in foley bag and a red scrotum that was without pain. He was treated for complicated UTI, sepsis. He received IV fluid resuscitation, Head CT without abnormality due to acute metabolic encephalopathy. His course was complicated by a pancreatic lesion found on imaging. In addition, he experienced hyponatremia. His course was additionally complicated by MS, Hypertension, T2DM, HLD, and BPH. Patient was stabilized on home regimens and discharged on antibiotics.  He completed cipro on Monday. Family report that he is at baseline cognition. Has [facility] nurse at home that continues to come weekly. They complete wound care dressing change daily and RN is reviewing it weekly. Denies any changes or symptoms. States that he is back to baseline since admission.   Relevant past medical, surgical, family, and social history reviewed and updated as indicated.  Allergies and  medications reviewed and updated. Data reviewed: Chart in Epic.   Past Medical History:  Diagnosis Date   Acquired leg length discrepancy    Bladder stone    BPH associated with nocturia    CAD (coronary artery disease) CARDIOLOGIST- DR HOCHREIN--  LAST VISIT 09-27-2011 IN EPIC   Non obstructive Cath 2010-  HX CORONARY SPASM 2004   Cataract    bilateral    GERD (gastroesophageal reflux disease)    H/O hiatal hernia    Heart murmur    Heartburn    High cholesterol    History of nephrolithiasis 2009   Hx of adenomatous colonic polyps    Hyperlipidemia    Hypertension    Movement disorder    Multiple sclerosis (HCC) DX  10/10---  NEUROLOGIST  DR Gerlene Burdock [place] ([place])   RIGHT SIDE AFFECTED MORE W/ WEAKNESS- - USES CANE   Nephrolithiasis 08/30/2020   Neuromuscular disorder (HCC)    MS   NIDDM (non-insulin dependent diabetes mellitus)    Peyronie's disease    RBBB    Renal stone RIGHT   Rosacea    Vision abnormalities     Past Surgical History:  Procedure Laterality Date   CARDIAC CATHETERIZATION  02-26-2009 ---  DR Julien Nordmann   NONOBSTRUCTIVE CAD/ 50% DISTAL RCA/ 30% LAD / NORMAL LVF   CYSTOSCOPY W/ RETROGRADES  10/30/2011   Procedure: CYSTOSCOPY WITH RETROGRADE PYELOGRAM;  Surgeon: Garnett Farm, MD;  Location: [facility];  Service: Urology;  Laterality: Right;   CYSTOSCOPY WITH LITHOLAPAXY  10/30/2011   Procedure: CYSTOSCOPY WITH LITHOLAPAXY;  Surgeon: Garnett Farm, MD;  [facility];  Service: Urology;  Laterality: N/A;   CYSTOSCOPY WITH  RETROGRADE PYELOGRAM, URETEROSCOPY AND STENT PLACEMENT Left 11/25/2020   Procedure: CYSTOSCOPY WITH LEFT RETROGRADE PYELOGRAM, LEFT URETEROSCOPY WITH LASER AND LEFT URETERAL STENT PLACEMENT;  Surgeon: Malen Gauze, MD;  Location: [facility];  Service: Urology;  Laterality: Left;   EXTRACORPOREAL SHOCK WAVE LITHOTRIPSY  09/05/2010   RIGHT   EYE SURGERY Right 08/30/2018   HOLMIUM LASER  APPLICATION Left 11/25/2020   Procedure: HOLMIUM LASER APPLICATION;  Surgeon: Malen Gauze, MD;  Location: [facility];  Service: Urology;  Laterality: Left;   NESBIT PROCEDURE  10/17/2004   CORRECTION OF PENILE ANGULATION (PEYRONIES DISEASE)   UTI  06/17/2022    Social History   Socioeconomic History   Marital status: Married    Spouse name: Steward Drone   Number of children: 2   Years of education: 13   Highest education level: Some college, no degree  Occupational History   Occupation: retired    Comment: self -employed farmer  Tobacco Use   Smoking status: Former    Current packs/day: 0.00    Average packs/day: 1 pack/day for 7.0 years (7.0 ttl pk-yrs)    Types: Cigarettes    Start date: 09/27/1966    Quit date: 09/26/1973    Years since quitting: 49.8   Smokeless tobacco: Current    Types: Chew   Tobacco comments:    Chewing tobacco  Vaping Use   Vaping status: Never Used  Substance and Sexual Activity   Alcohol use: No    Alcohol/week: 0.0 standard drinks of alcohol   Drug use: No   Sexual activity: Not Currently  Other Topics Concern   Not on file  Social History Narrative   Lives with wife, has MS, mobility issues.   Social Drivers of Corporate investment banker Strain: Low Risk  (11/23/2022)   Overall Financial Resource Strain (CARDIA)    Difficulty of Paying Living Expenses: Not hard at all  Food Insecurity: No Food Insecurity (07/02/2023)   Hunger Vital Sign    Worried About Running Out of Food in the Last Year: Never true    Ran Out of Food in the Last Year: Never true  Transportation Needs: No Transportation Needs (07/02/2023)   PRAPARE - Administrator, Civil Service (Medical): No    Lack of Transportation (Non-Medical): No  Physical Activity: Insufficiently Active (11/23/2022)   Exercise Vital Sign    Days of Exercise per Week: 1 day    Minutes of Exercise per Session: 10 min  Stress: No Stress Concern Present (11/23/2022)   [employer]  of Occupational Health - Occupational Stress Questionnaire    Feeling of Stress : Not at all  Social Connections: Moderately Integrated (11/23/2022)   Social Connection and Isolation Panel [NHANES]    Frequency of Communication with Friends and Family: More than three times a week    Frequency of Social Gatherings with Friends and Family: More than three times a week    Attends Religious Services: More than 4 times per year    Active Member of [employer] or Organizations: No    Attends Banker Meetings: Never    Marital Status: Married  Catering manager Violence: Not At Risk (07/02/2023)   Humiliation, Afraid, Rape, and Kick questionnaire    Fear of Current or Ex-Partner: No    Emotionally Abused: No    Physically Abused: No    Sexually Abused: No    Outpatient Encounter Medications as of 07/12/2023  Medication Sig   acetaminophen (TYLENOL) 325 MG  tablet Take 2 tablets (650 mg total) by mouth every 6 (six) hours as needed for mild pain (pain score 1-3) (or Fever >/= 101).   Ascorbic Acid (VITAMIN C PO) Take 500 mg by mouth in the morning and at bedtime.   aspirin 81 MG EC tablet Take 81 mg by mouth daily.   Cholecalciferol (VITAMIN D) 50 MCG (2000 UT) tablet Take 2,000 Units by mouth daily.   cyanocobalamin (VITAMIN B12) 1000 MCG/ML injection INJECT INTO THE SKIN ONCE A MONTH   ferrous sulfate 325 (65 FE) MG tablet Take 1 tablet (325 mg total) by mouth daily with breakfast.   finasteride (PROSCAR) 5 MG tablet Take 1 tablet (5 mg total) by mouth daily.   glucose blood test strip Use as instructed   insulin glargine (LANTUS SOLOSTAR) 100 UNIT/ML Solostar Pen Inject 55 Units into the skin daily.   Insulin Pen Needle 31G X 5 MM MISC Use with lantus pen as directed   isosorbide mononitrate (IMDUR) 60 MG 24 hr tablet TAKE 1/2 TABLET (30MG  TOTAL) BY MOUTH DAILY   leptosperum manuka honey (THERAHONEY) GEL gel Apply 1 Application topically daily. Apply daily to necrotic areas of  Buttocks   levothyroxine (SYNTHROID) 88 MCG tablet Take 1 tablet (88 mcg total) by mouth daily.   liver oil-zinc oxide (DESITIN) 40 % ointment Apply topically 2 (two) times daily. Apply Thin layer of Desitin  (Zinc Oxide) 2 times daily and as additional application needed for soiling.   magnesium oxide (MAG-OX) 400 MG tablet Take 1 tablet by mouth daily.   olmesartan (BENICAR) 5 MG tablet Take 1 tablet (5 mg total) by mouth daily.   omeprazole (PRILOSEC) 20 MG capsule TAKE ONE CAPSULE (20MG  TOTAL) BY MOUTH DAILY   rosuvastatin (CRESTOR) 10 MG tablet Take 1 tablet (10 mg total) by mouth daily.   Semaglutide,0.25 or 0.5MG /DOS, (OZEMPIC, 0.25 OR 0.5 MG/DOSE,) 2 MG/3ML SOPN Inject 0.25mg  into skin weekly for 4 weeks, then increase to 0.5mg  weekly thereafter. DX:E11.65   silodosin (RAPAFLO) 8 MG CAPS capsule Take 1 capsule (8 mg total) by mouth at bedtime.   sodium bicarbonate 650 MG tablet Take 650 mg by mouth 3 (three) times daily.   sodium citrate-citric acid (ORACIT) 500-334 MG/5ML solution Take 15 mLs by mouth 2 (two) times daily.   SPS, SODIUM POLYSTYRENE SULF, 15 GM/60ML suspension Take 15 mLs by mouth in the morning and at bedtime.   [DISCONTINUED] atorvastatin (LIPITOR) 10 MG tablet Take 10 mg by mouth daily.   [DISCONTINUED] niacin-simvastatin (SIMCOR) 500-20 MG 24 hr tablet Take 1 tablet by mouth at bedtime.     No facility-administered encounter medications on file as of 07/12/2023.    Allergies  Allergen Reactions   Farxiga [Dapagliflozin] Other (See Comments)    unable to take [place] due to recurrent UTIs, urinary retention and hydronephrosis    Chauncey Mann [Finerenone] Other (See Comments)    hyperkalemia    Review of Systems  Objective:  BP 100/68   Pulse 90   Temp 98.5 F (36.9 C)   Ht 5\' 6"  (1.676 m)   Wt 160 lb (72.6 kg) Comment: approximate  SpO2 97%   BMI 25.82 kg/m    Wt Readings from Last 3 Encounters:  07/12/23 160 lb (72.6 kg)  06/27/23 166 lb 14.2 oz (75.7  kg)  02/14/23 160 lb (72.6 kg)    Physical Exam Constitutional:      General: He is awake. He is not in acute distress.  Appearance: Normal appearance. He is well-developed, well-groomed and overweight. He is ill-appearing. He is not toxic-appearing or diaphoretic.  Cardiovascular:     Rate and Rhythm: Normal rate and regular rhythm.     Pulses: Normal pulses.          Radial pulses are 2+ on the right side and 2+ on the left side.       Posterior tibial pulses are 2+ on the right side and 2+ on the left side.     Heart sounds: Normal heart sounds. No murmur heard.    No gallop.  Pulmonary:     Effort: Pulmonary effort is normal. No respiratory distress.     Breath sounds: Normal breath sounds. No stridor, decreased air movement or transmitted upper airway sounds. No decreased breath sounds, wheezing, rhonchi or rales.  Abdominal:     General: Abdomen is flat. Bowel sounds are normal.     Palpations: Abdomen is soft.  Genitourinary:    Comments: Indwelling catheter in place, with clear yellow urine in drainage bag  Musculoskeletal:     Cervical back: Full passive range of motion without pain and neck supple.     Right lower leg: No edema.     Left lower leg: No edema.     Comments: Generalized weakness, requires two person assist to briefly stand, uses wheelchair   Skin:    General: Skin is warm.     Capillary Refill: Capillary refill takes less than 2 seconds.          Comments: Excoriated, dry, flaky skin with healing wound   Neurological:     General: No focal deficit present.     Mental Status: He is oriented to person, place, and time and easily aroused. Mental status is at baseline. He is lethargic.     GCS: GCS eye subscore is 4. GCS verbal subscore is 5. GCS motor subscore is 6.     Motor: No weakness.  Psychiatric:        Attention and Perception: Attention and perception normal.        Speech: Speech is slurred.        Behavior: Behavior is slowed and withdrawn.  Behavior is not agitated, aggressive, hyperactive or combative. Behavior is cooperative.        Thought Content: Thought content normal. Thought content does not include homicidal or suicidal ideation. Thought content does not include homicidal or suicidal plan.        Cognition and Memory: Cognition is impaired. Memory is impaired. He exhibits impaired recent memory and impaired remote memory.    Results for orders placed or performed during the hospital encounter of 06/26/23  Urine Culture   Collection Time: 06/26/23  2:00 PM   Specimen: Urine, Clean Catch  Result Value Ref Range   Specimen Description      URINE, CLEAN CATCH Performed at [facility], [address], [place], [place] [address]    Special Requests      NONE Reflexed from [id number] Performed at [facility], [address], [place], [place] [address]    Culture (A)     >=100,000 COLONIES/mL PSEUDOMONAS AERUGINOSA 50,000 COLONIES/mL KLEBSIELLA AEROGENES    Report Status 06/29/2023 FINAL    Organism ID, Bacteria PSEUDOMONAS AERUGINOSA (A)    Organism ID, Bacteria KLEBSIELLA AEROGENES (A)       Susceptibility   Pseudomonas aeruginosa - MIC*    CEFTAZIDIME 4 SENSITIVE Sensitive     CIPROFLOXACIN 0.5 SENSITIVE Sensitive  GENTAMICIN <=1 SENSITIVE Sensitive     IMIPENEM 1 SENSITIVE Sensitive     CEFEPIME 4 SENSITIVE Sensitive     * >=100,000 COLONIES/mL PSEUDOMONAS AERUGINOSA   Klebsiella aerogenes - MIC*    CEFEPIME <=0.12 SENSITIVE Sensitive     CEFTRIAXONE <=0.25 SENSITIVE Sensitive     CIPROFLOXACIN <=0.25 SENSITIVE Sensitive     GENTAMICIN <=1 SENSITIVE Sensitive     IMIPENEM 1 SENSITIVE Sensitive     NITROFURANTOIN 64 INTERMEDIATE Intermediate     TRIMETH/SULFA <=20 SENSITIVE Sensitive     PIP/TAZO <=4 SENSITIVE Sensitive ug/mL    * 50,000 COLONIES/mL KLEBSIELLA AEROGENES  Urinalysis, w/ Reflex to Culture (Infection Suspected) -Urine, Clean Catch   Collection Time: 06/26/23  2:00 PM  Result Value Ref  Range   Specimen Source URINE, CLEAN CATCH    Color, Urine YELLOW YELLOW   APPearance TURBID (A) CLEAR   Specific Gravity, Urine 1.014 1.005 - 1.030   pH 5.0 5.0 - 8.0   Glucose, UA 50 (A) NEGATIVE mg/dL   Hgb urine dipstick SMALL (A) NEGATIVE   Bilirubin Urine NEGATIVE NEGATIVE   Ketones, ur NEGATIVE NEGATIVE mg/dL   Protein, ur 409 (A) NEGATIVE mg/dL   Nitrite NEGATIVE NEGATIVE   Leukocytes,Ua MODERATE (A) NEGATIVE   RBC / HPF 21-50 0 - 5 RBC/hpf   WBC, UA >50 0 - 5 WBC/hpf   Bacteria, UA NONE SEEN NONE SEEN   Squamous Epithelial / HPF 0-5 0 - 5 /HPF   WBC Clumps PRESENT    Mucus PRESENT   Blood Culture (routine x 2)   Collection Time: 06/26/23  2:03 PM   Specimen: BLOOD  Result Value Ref Range   Specimen Description      BLOOD RIGHT ASSIST CONTROL Performed at [facility], [address], [place], [place] [address]    Special Requests      BOTTLES DRAWN AEROBIC AND ANAEROBIC Blood Culture adequate volume Performed at [facility], [address] [place], [place] [address]    Culture  Setup Time      AEROBIC BOTTLE ONLY GRAM POSITIVE COCCI Gram Stain Report Called to,Read Back By and Verified With: JUDY BELL @1640  ON 06/27/23 C VARNER CRITICAL RESULT CALLED TO, READ BACK BY AND VERIFIED WITH: Perlie Mayo RN 06/28/2023 @ 0038 BY AB    Culture (A)     STAPHYLOCOCCUS HAEMOLYTICUS THE SIGNIFICANCE OF ISOLATING THIS ORGANISM FROM A SINGLE SET OF BLOOD CULTURES WHEN MULTIPLE SETS ARE DRAWN IS UNCERTAIN. PLEASE NOTIFY THE MICROBIOLOGY DEPARTMENT WITHIN ONE WEEK IF SPECIATION AND SENSITIVITIES ARE REQUIRED. Performed at [facility], [address], [place], [place] [address]    Report Status 06/28/2023 FINAL   Blood Culture ID Panel (Reflexed)   Collection Time: 06/26/23  2:03 PM  Result Value Ref Range   Enterococcus faecalis NOT DETECTED NOT DETECTED   Enterococcus Faecium NOT DETECTED NOT DETECTED   Listeria monocytogenes NOT DETECTED NOT DETECTED   Staphylococcus  species DETECTED (A) NOT DETECTED   Staphylococcus aureus (BCID) NOT DETECTED NOT DETECTED   Staphylococcus epidermidis NOT DETECTED NOT DETECTED   Staphylococcus lugdunensis NOT DETECTED NOT DETECTED   Streptococcus species NOT DETECTED NOT DETECTED   Streptococcus agalactiae NOT DETECTED NOT DETECTED   Streptococcus pneumoniae NOT DETECTED NOT DETECTED   Streptococcus pyogenes NOT DETECTED NOT DETECTED   A.calcoaceticus-baumannii NOT DETECTED NOT DETECTED   Bacteroides fragilis NOT DETECTED NOT DETECTED   Enterobacterales NOT DETECTED NOT DETECTED   Enterobacter cloacae complex NOT DETECTED NOT  DETECTED   Escherichia coli NOT DETECTED NOT DETECTED   Klebsiella aerogenes NOT DETECTED NOT DETECTED   Klebsiella oxytoca NOT DETECTED NOT DETECTED   Klebsiella pneumoniae NOT DETECTED NOT DETECTED   Proteus species NOT DETECTED NOT DETECTED   Salmonella species NOT DETECTED NOT DETECTED   Serratia marcescens NOT DETECTED NOT DETECTED   Haemophilus influenzae NOT DETECTED NOT DETECTED   Neisseria meningitidis NOT DETECTED NOT DETECTED   Pseudomonas aeruginosa NOT DETECTED NOT DETECTED   Stenotrophomonas maltophilia NOT DETECTED NOT DETECTED   Candida albicans NOT DETECTED NOT DETECTED   Candida auris NOT DETECTED NOT DETECTED   Candida glabrata NOT DETECTED NOT DETECTED   Candida krusei NOT DETECTED NOT DETECTED   Candida parapsilosis NOT DETECTED NOT DETECTED   Candida tropicalis NOT DETECTED NOT DETECTED   Cryptococcus neoformans/gattii NOT DETECTED NOT DETECTED  Lactic acid, plasma   Collection Time: 06/26/23  2:03 PM  Result Value Ref Range   Lactic Acid, Venous 1.9 0.5 - 1.9 mmol/L  Comprehensive metabolic panel   Collection Time: 06/26/23  2:03 PM  Result Value Ref Range   Sodium 127 (L) 135 - 145 mmol/L   Potassium 4.0 3.5 - 5.1 mmol/L   Chloride 92 (L) 98 - 111 mmol/L   CO2 22 22 - 32 mmol/L   Glucose, Bld 208 (H) 70 - 99 mg/dL   BUN 23 8 - 23 mg/dL   Creatinine, Ser 4.09  (H) 0.61 - 1.24 mg/dL   Calcium 9.4 8.9 - 81.1 mg/dL   Total Protein 7.7 6.5 - 8.1 g/dL   Albumin 3.5 3.5 - 5.0 g/dL   AST 27 15 - 41 U/L   ALT 24 0 - 44 U/L   Alkaline Phosphatase 149 (H) 38 - 126 U/L   Total Bilirubin 0.8 <1.2 mg/dL   GFR, Estimated 38 (L) >60 mL/min   Anion gap 13 5 - 15  CBC with Differential   Collection Time: 06/26/23  2:03 PM  Result Value Ref Range   WBC 25.4 (H) 4.0 - 10.5 K/uL   RBC 4.28 4.22 - 5.81 MIL/uL   Hemoglobin 11.4 (L) 13.0 - 17.0 g/dL   HCT 91.4 (L) 78.2 - 95.6 %   MCV 83.2 80.0 - 100.0 fL   MCH 26.6 26.0 - 34.0 pg   MCHC 32.0 30.0 - 36.0 g/dL   RDW 21.3 (H) 08.6 - 57.8 %   Platelets 239 150 - 400 K/uL   nRBC 0.0 0.0 - 0.2 %   Neutrophils Relative % 85 %   Neutro Abs 21.7 (H) 1.7 - 7.7 K/uL   Lymphocytes Relative 4 %   Lymphs Abs 1.0 0.7 - 4.0 K/uL   Monocytes Relative 10 %   Monocytes Absolute 2.5 (H) 0.1 - 1.0 K/uL   Eosinophils Relative 0 %   Eosinophils Absolute 0.1 0.0 - 0.5 K/uL   Basophils Relative 0 %   Basophils Absolute 0.1 0.0 - 0.1 K/uL   WBC Morphology MORPHOLOGY UNREMARKABLE    Smear Review MORPHOLOGY UNREMARKABLE    Immature Granulocytes 1 %   Abs Immature Granulocytes 0.18 (H) 0.00 - 0.07 K/uL   Ovalocytes PRESENT   Protime-INR   Collection Time: 06/26/23  2:03 PM  Result Value Ref Range   Prothrombin Time 13.7 11.4 - 15.2 seconds   INR 1.0 0.8 - 1.2  APTT   Collection Time: 06/26/23  2:03 PM  Result Value Ref Range   aPTT 33 24 - 36 seconds  Brain natriuretic peptide  Collection Time: 06/26/23  2:03 PM  Result Value Ref Range   B Natriuretic Peptide 465.0 (H) 0.0 - 100.0 pg/mL  CBG monitoring, ED   Collection Time: 06/26/23  2:11 PM  Result Value Ref Range   Glucose-Capillary 196 (H) 70 - 99 mg/dL  Blood Culture (routine x 2)   Collection Time: 06/26/23  2:53 PM   Specimen: BLOOD  Result Value Ref Range   Specimen Description BLOOD rt wrist    Special Requests      BOTTLES DRAWN AEROBIC AND ANAEROBIC  Blood Culture adequate volume   Culture      NO GROWTH 5 DAYS Performed at [facility], [address] [place], [place] [address]    Report Status 07/01/2023 FINAL   Resp panel by RT-PCR (RSV, Flu A&B, Covid)   Collection Time: 06/26/23  3:50 PM  Result Value Ref Range   SARS Coronavirus 2 by RT PCR NEGATIVE NEGATIVE   Influenza A by PCR NEGATIVE NEGATIVE   Influenza B by PCR NEGATIVE NEGATIVE   Resp Syncytial Virus by PCR NEGATIVE NEGATIVE  Lactic acid, plasma   Collection Time: 06/26/23  5:09 PM  Result Value Ref Range   Lactic Acid, Venous 1.7 0.5 - 1.9 mmol/L  Urine Culture   Collection Time: 06/26/23  5:12 PM   Specimen: Urine, Catheterized  Result Value Ref Range   Specimen Description      URINE, CATHETERIZED Performed at [facility], [address], [place], [place] [address]    Special Requests      NONE Reflexed from [id number] Performed at [facility], [address] [place], [place] [address]    Culture 70,000 COLONIES/mL PSEUDOMONAS AERUGINOSA (A)    Report Status 06/29/2023 FINAL    Organism ID, Bacteria PSEUDOMONAS AERUGINOSA (A)       Susceptibility   Pseudomonas aeruginosa - MIC*    CEFTAZIDIME 4 SENSITIVE Sensitive     CIPROFLOXACIN 0.5 SENSITIVE Sensitive     GENTAMICIN <=1 SENSITIVE Sensitive     IMIPENEM 1 SENSITIVE Sensitive     CEFEPIME 4 SENSITIVE Sensitive     * 70,000 COLONIES/mL PSEUDOMONAS AERUGINOSA  Urinalysis, w/ Reflex to Culture (Infection Suspected) -Urine, Catheterized   Collection Time: 06/26/23  5:12 PM  Result Value Ref Range   Specimen Source URINE, CLEAN CATCH    Color, Urine YELLOW YELLOW   APPearance CLOUDY (A) CLEAR   Specific Gravity, Urine 1.006 1.005 - 1.030   pH 8.0 5.0 - 8.0   Glucose, UA 50 (A) NEGATIVE mg/dL   Hgb urine dipstick LARGE (A) NEGATIVE   Bilirubin Urine NEGATIVE NEGATIVE   Ketones, ur NEGATIVE NEGATIVE mg/dL   Protein, ur 30 (A) NEGATIVE mg/dL   Nitrite NEGATIVE NEGATIVE   Leukocytes,Ua LARGE (A)  NEGATIVE   RBC / HPF 6-10 0 - 5 RBC/hpf   WBC, UA >50 0 - 5 WBC/hpf   Bacteria, UA RARE (A) NONE SEEN   Squamous Epithelial / HPF 0-5 0 - 5 /HPF   WBC Clumps PRESENT   Blood gas, venous (at [facility])   Collection Time: 06/26/23  9:02 PM  Result Value Ref Range   pH, Ven 7.41 7.25 - 7.43   pCO2, Ven 34 (L) 44 - 60 mmHg   pO2, Ven 58 (H) 32 - 45 mmHg   Bicarbonate 21.4 20.0 - 28.0 mmol/L   Acid-base deficit 2.3 (H) 0.0 - 2.0 mmol/L   O2 Saturation 90.7 %   Patient temperature 37.6  Collection site BLOOD RIGHT HAND    Drawn by 1528   Ammonia   Collection Time: 06/26/23  9:02 PM  Result Value Ref Range   Ammonia 18 9 - 35 umol/L  MRSA Next Gen by PCR, Nasal   Collection Time: 06/26/23 10:14 PM   Specimen: Nasal Mucosa; Nasal Swab  Result Value Ref Range   MRSA by PCR Next Gen NOT DETECTED NOT DETECTED  Glucose, capillary   Collection Time: 06/26/23 11:16 PM  Result Value Ref Range   Glucose-Capillary 86 70 - 99 mg/dL  Basic metabolic panel   Collection Time: 06/27/23  2:58 AM  Result Value Ref Range   Sodium 136 135 - 145 mmol/L   Potassium 3.5 3.5 - 5.1 mmol/L   Chloride 107 98 - 111 mmol/L   CO2 20 (L) 22 - 32 mmol/L   Glucose, Bld 84 70 - 99 mg/dL   BUN 19 8 - 23 mg/dL   Creatinine, Ser 7.82 (H) 0.61 - 1.24 mg/dL   Calcium 8.8 (L) 8.9 - 10.3 mg/dL   GFR, Estimated 47 (L) >60 mL/min   Anion gap 9 5 - 15  CBC   Collection Time: 06/27/23  2:58 AM  Result Value Ref Range   WBC 14.7 (H) 4.0 - 10.5 K/uL   RBC 3.64 (L) 4.22 - 5.81 MIL/uL   Hemoglobin 9.5 (L) 13.0 - 17.0 g/dL   HCT 95.6 (L) 21.3 - 08.6 %   MCV 83.8 80.0 - 100.0 fL   MCH 26.1 26.0 - 34.0 pg   MCHC 31.1 30.0 - 36.0 g/dL   RDW 57.8 (H) 46.9 - 62.9 %   Platelets 167 150 - 400 K/uL   nRBC 0.0 0.0 - 0.2 %  Glucose, capillary   Collection Time: 06/27/23  3:13 AM  Result Value Ref Range   Glucose-Capillary 71 70 - 99 mg/dL  Glucose, capillary   Collection Time: 06/27/23  4:16 AM  Result Value Ref  Range   Glucose-Capillary 73 70 - 99 mg/dL  Glucose, capillary   Collection Time: 06/27/23  7:23 AM  Result Value Ref Range   Glucose-Capillary 73 70 - 99 mg/dL  Glucose, capillary   Collection Time: 06/27/23 11:02 AM  Result Value Ref Range   Glucose-Capillary 217 (H) 70 - 99 mg/dL  Glucose, capillary   Collection Time: 06/27/23  4:32 PM  Result Value Ref Range   Glucose-Capillary 222 (H) 70 - 99 mg/dL  Glucose, capillary   Collection Time: 06/27/23  9:52 PM  Result Value Ref Range   Glucose-Capillary 217 (H) 70 - 99 mg/dL  Glucose, capillary   Collection Time: 06/27/23 11:37 PM  Result Value Ref Range   Glucose-Capillary 202 (H) 70 - 99 mg/dL  Glucose, capillary   Collection Time: 06/28/23  3:59 AM  Result Value Ref Range   Glucose-Capillary 112 (H) 70 - 99 mg/dL  Glucose, capillary   Collection Time: 06/28/23  8:03 AM  Result Value Ref Range   Glucose-Capillary 123 (H) 70 - 99 mg/dL  CBC   Collection Time: 06/28/23  9:08 AM  Result Value Ref Range   WBC 9.7 4.0 - 10.5 K/uL   RBC 4.08 (L) 4.22 - 5.81 MIL/uL   Hemoglobin 10.9 (L) 13.0 - 17.0 g/dL   HCT 52.8 (L) 41.3 - 24.4 %   MCV 84.3 80.0 - 100.0 fL   MCH 26.7 26.0 - 34.0 pg   MCHC 31.7 30.0 - 36.0 g/dL   RDW 01.0 (H) 27.2 - 53.6 %  Platelets 167 150 - 400 K/uL   nRBC 0.0 0.0 - 0.2 %  Basic metabolic panel   Collection Time: 06/28/23  9:08 AM  Result Value Ref Range   Sodium 133 (L) 135 - 145 mmol/L   Potassium 4.1 3.5 - 5.1 mmol/L   Chloride 106 98 - 111 mmol/L   CO2 18 (L) 22 - 32 mmol/L   Glucose, Bld 201 (H) 70 - 99 mg/dL   BUN 20 8 - 23 mg/dL   Creatinine, Ser 1.61 (H) 0.61 - 1.24 mg/dL   Calcium 9.1 8.9 - 09.6 mg/dL   GFR, Estimated 45 (L) >60 mL/min   Anion gap 9 5 - 15  Glucose, capillary   Collection Time: 06/28/23 12:03 PM  Result Value Ref Range   Glucose-Capillary 207 (H) 70 - 99 mg/dL  Glucose, capillary   Collection Time: 06/28/23  4:47 PM  Result Value Ref Range   Glucose-Capillary 339  (H) 70 - 99 mg/dL  Glucose, capillary   Collection Time: 06/28/23  8:19 PM  Result Value Ref Range   Glucose-Capillary 310 (H) 70 - 99 mg/dL  Glucose, capillary   Collection Time: 06/29/23 12:00 AM  Result Value Ref Range   Glucose-Capillary 245 (H) 70 - 99 mg/dL  Glucose, capillary   Collection Time: 06/29/23  4:25 AM  Result Value Ref Range   Glucose-Capillary 170 (H) 70 - 99 mg/dL  Glucose, capillary   Collection Time: 06/29/23  7:49 AM  Result Value Ref Range   Glucose-Capillary 159 (H) 70 - 99 mg/dL  Glucose, capillary   Collection Time: 06/29/23 11:11 AM  Result Value Ref Range   Glucose-Capillary 285 (H) 70 - 99 mg/dL       04/54/0981    1:91 PM 04/16/2023    4:29 PM 01/26/2023    4:32 PM 11/23/2022   11:17 AM 10/27/2022    3:37 PM  Depression screen PHQ 2/9  Decreased Interest 0 0 0 0 0  Down, Depressed, Hopeless 0 0 0 0 0  PHQ - 2 Score 0 0 0 0 0  Altered sleeping  0 0 0 0  Tired, decreased energy  0 0 0 0  Change in appetite  0 0 0 0  Feeling bad or failure about yourself   0 0 0 0  Trouble concentrating  0 0 0 0  Moving slowly or fidgety/restless  0 0 0 0  Suicidal thoughts  0 0 0 0  PHQ-9 Score  0 0 0 0  Difficult doing work/chores  Not difficult at all Not difficult at all Not difficult at all Not difficult at all       07/12/2023    3:54 PM 04/16/2023    4:30 PM 01/26/2023    4:33 PM 10/27/2022    3:37 PM  GAD 7 : Generalized Anxiety Score  Nervous, Anxious, on Edge 0 0 0 0  Control/stop worrying 0 0 0 0  Worry too much - different things 0 0 0 0  Trouble relaxing 0 0 0 0  Restless 0 0 0 0  Easily annoyed or irritable 0 0 0 0  Afraid - awful might happen 0 0 0 0  Total GAD 7 Score 0 0 0 0  Anxiety Difficulty Not difficult at all Not difficult at all Not difficult at all Not difficult at all   Pertinent labs & imaging results that were available during my care of the patient were reviewed by me and considered in my medical  decision  making.  Assessment & Plan:  Jacob "Ree Kida" was seen today for hospitalization follow-up.  Diagnoses and all orders for this visit:  Sepsis without acute organ dysfunction, due to unspecified organism [facility]) Reviewed admission notes on 06/26/23 from Wendall Stade., MD Reviewed discharge notes from 06/29/23 from Jayme Cloud. MD  Labs as below. Will communicate results to patient once available. Will await results to determine next steps.  Patient completed abx. Continue to follow up with specialty. Will complete imaging as below. Discussed with patient and family additional resources. Declined at this time. Discussed with patient and family continuing wound care.  -     CBC with Differential/Platelet -     CMP14+EGFR  Chronic indwelling Foley catheter Patient to follow up with Urology. Established with Dr. Ronne Binning. Labs as below. Will communicate results to patient once available. Will await results to determine next steps.  Continue to change foley monthly per notes from ED 06/29/23 by Mariea Clonts, MD.  -     CBC with Differential/Platelet -     CMP14+EGFR  Pancreatic lesion Imaging as below. Will communicate results to patient once available. Will await results to determine next steps.  Per notes from admission, may need EGD with EUS for tissue sampling -     MR ABDOMEN MRCP W WO CONTAST; Future  MS (multiple sclerosis) (HCC) Patient to continue to work with nurse at home and family. Offered additional resources, declined at this time.   Wound of right buttock, subsequent encounter Continue to complete wound care dressing change daily. Monitor for signs of sepsis. Will complete labs as above.     Continue all other maintenance medications.  Follow up plan: Return for appt with PCP in jan .   Continue healthy lifestyle choices, including diet (rich in fruits, vegetables, and lean proteins, and low in salt and simple carbohydrates) and exercise (at least 30 minutes of moderate physical  activity daily).  Written and verbal instructions provided   The above assessment and management plan was discussed with the patient. The patient verbalized understanding of and has agreed to the management plan. Patient is aware to call the clinic if they develop any new symptoms or if symptoms persist or worsen. Patient is aware when to return to the clinic for a follow-up visit. Patient educated on when it is appropriate to go to the emergency department.   Neale Burly, DNP-FNP [facility] [address] [place], [place] [address] [phone]

## 2023-07-12 NOTE — Patient Instructions (Signed)
[facility], [address] [place], [address], [place] [address] Phone Number----[phone]

## 2023-07-13 LAB — CBC WITH DIFFERENTIAL/PLATELET
Basophils Absolute: 0.1 10*3/uL (ref 0.0–0.2)
Basos: 0 %
EOS (ABSOLUTE): 0.2 10*3/uL (ref 0.0–0.4)
Eos: 2 %
Hematocrit: 40 % (ref 37.5–51.0)
Hemoglobin: 12.4 g/dL — ABNORMAL LOW (ref 13.0–17.7)
Immature Grans (Abs): 0 10*3/uL (ref 0.0–0.1)
Immature Granulocytes: 0 %
Lymphocytes Absolute: 1.3 10*3/uL (ref 0.7–3.1)
Lymphs: 11 %
MCH: 26.3 pg — ABNORMAL LOW (ref 26.6–33.0)
MCHC: 31 g/dL — ABNORMAL LOW (ref 31.5–35.7)
MCV: 85 fL (ref 79–97)
Monocytes Absolute: 0.8 10*3/uL (ref 0.1–0.9)
Monocytes: 7 %
Neutrophils Absolute: 9.1 10*3/uL — ABNORMAL HIGH (ref 1.4–7.0)
Neutrophils: 80 %
Platelets: 226 10*3/uL (ref 150–450)
RBC: 4.72 x10E6/uL (ref 4.14–5.80)
RDW: 14.8 % (ref 11.6–15.4)
WBC: 11.5 10*3/uL — ABNORMAL HIGH (ref 3.4–10.8)

## 2023-07-13 LAB — CMP14+EGFR
ALT: 28 [IU]/L (ref 0–44)
AST: 22 [IU]/L (ref 0–40)
Albumin: 4.3 g/dL (ref 3.8–4.8)
Alkaline Phosphatase: 188 [IU]/L — ABNORMAL HIGH (ref 44–121)
BUN/Creatinine Ratio: 16 (ref 10–24)
BUN: 26 mg/dL (ref 8–27)
Bilirubin Total: 0.4 mg/dL (ref 0.0–1.2)
CO2: 22 mmol/L (ref 20–29)
Calcium: 9.8 mg/dL (ref 8.6–10.2)
Chloride: 94 mmol/L — ABNORMAL LOW (ref 96–106)
Creatinine, Ser: 1.59 mg/dL — ABNORMAL HIGH (ref 0.76–1.27)
Globulin, Total: 3.5 g/dL (ref 1.5–4.5)
Glucose: 354 mg/dL — ABNORMAL HIGH (ref 70–99)
Potassium: 5.1 mmol/L (ref 3.5–5.2)
Sodium: 131 mmol/L — ABNORMAL LOW (ref 134–144)
Total Protein: 7.8 g/dL (ref 6.0–8.5)
eGFR: 46 mL/min/{1.73_m2} — ABNORMAL LOW (ref >59)

## 2023-07-13 NOTE — Progress Notes (Signed)
Labs stable. Sodium corrected for hyperglycemia is within normal limits. Recommend patient follow up with PCP and with urology McKenzie, Mardene Celeste, MD. Contact information: [address]  [place] [address] [phone]

## 2023-07-16 ENCOUNTER — Other Ambulatory Visit: Payer: Self-pay | Admitting: *Deleted

## 2023-07-16 NOTE — Patient Outreach (Signed)
Care Management  Transitions of Care Program Transitions of Care Post-discharge week 3   07/16/2023 Name: Jacob Fowler MRN: [id number] DOB: 08-31-1950  Subjective: Jacob Fowler is a 72 y.o. year old male who is a primary care patient of Jacob Pierini, FNP. The Care Management team Engaged with patient Engaged with patient by telephone to assess and address transitions of care needs.   Consent to Services:  Patient/ spouse previously agreed to 30 day program enrollment  Assessment: Per daughter, pt is doing well, saw primary care provider 12/26 and no changes made, home health is now seeing pt 1 x per week, wound is healed, applying desitin only, no issues with foley catheter, drinking one glucerna daily.         SDOH Interventions    Flowsheet Row Telephone from 07/02/2023 in [facility] Clinical Support from 11/23/2022 in [facility] Telephone from 06/20/2022 in [facility] Community Care Coordination Patient Outreach Telephone from 03/15/2022 in [facility]  SDOH Interventions      Food Insecurity Interventions Intervention Not Indicated Intervention Not Indicated Intervention Not Indicated Intervention Not Indicated  Housing Interventions Intervention Not Indicated Intervention Not Indicated -- --  Transportation Interventions Intervention Not Indicated Intervention Not Indicated Intervention Not Indicated  [Family provides transportation] Intervention Not Indicated  Utilities Interventions Intervention Not Indicated -- -- --  Alcohol Usage Interventions -- Intervention Not Indicated (Score <7) -- --  Financial Strain Interventions -- Intervention Not Indicated -- --  Physical Activity Interventions -- Intervention Not Indicated -- --  Stress Interventions -- Intervention Not Indicated -- --  Social Connections Interventions -- Intervention  Not Indicated -- --        Goals Addressed             This Visit's Progress    Transition of Care/ pt will have no readmissions within 30 days       Current Barriers:  Chronic Disease Management support and education needs related to DMII and acute epididymoorchitis  Spoke with patient and spouse Jacob Fowler who reports patient is doing much better, has all medications and taking as prescribed Foley catheter is changed monthly at urologist, no issues reported with catheter [facility] home health is working with patient Spouse and daughter is providing wound care, states " it looks good, no drainage, only applying desitin now" to buttocks area, spouse is trying to make sure positions changed q 2 hours, patient is eating well, pt is in wheelchair at all times, spouse assists with transfers, bathing, etc. Patient saw primary care provider on 07/12/23  RNCM Clinical Goal(s):  Patient will work with the Care Management team over the next 30 days to address Transition of Care Barriers: Home Health services verbalize understanding of plan for management of DMII and acute epididymoorchitis as evidenced by patient/ spouse report, review of EMR and  through collaboration with RN Care manager, provider, and care team.   Interventions: Acute epididymoorchitis: Evaluation of current treatment plan related to  self management and patient's adherence to plan as established by provider Reviewed signs/ symptoms of infection including UTI and reportable signs/ symptoms Reviewed correlation of elevated CBG and infection, slow healing Reviewed importance of foley catheter care/ maintenance Reinforced per discharge instructions- avoid excessive plain water intake due to low sodium Reviewed adequate protein intake at each meal- pt is drinking one glucerna daily Reinforced changing positions q 2 hours Encouraged patient to continue working with home health  Reviewed upcoming scheduled appointments including  07/29/22 @ urologist  Diabetes Interventions:  (Status:  New goal. and Goal on track:  Yes.) Short Term Goal Assessed patient's understanding of A1c goal: <7% Reviewed medications with patient and discussed importance of medication adherence Counseled on importance of regular laboratory monitoring as prescribed Discussed plans with patient for ongoing care management follow up and provided patient with direct contact information for care management team Review of patient status, including review of consultants reports, relevant laboratory and other test results, and medications completed Reviewed carbohydrate modified diet Lab Results  Component Value Date   HGBA1C 8.6 (H) 04/16/2023    Patient Goals/Self-Care Activities: Participate in Transition of Care Program/Attend TOC scheduled calls Notify RN Care Manager of TOC call rescheduling needs Take all medications as prescribed Attend all scheduled provider appointments Call pharmacy for medication refills 3-7 days in advance of running out of medications check blood sugar at prescribed times: once daily check feet daily for cuts, sores or redness enter blood sugar readings and medication or insulin into daily log take the blood sugar log to all doctor visits trim toenails straight across limit fast food meals to no more than 1 per week wash and dry feet carefully every day wear comfortable, cotton socks wear comfortable, well-fitting shoes Continue working with [facility]-  [phone] Change positions every 2 hours Consume adequate protein at each meal  Follow Up Plan:  The patient has been provided with contact information for the care management team and has been advised to call with any health related questions or concerns.   Telephone outreach scheduled for 07/23/23 @ 9 am        Plan: Telephone follow up appointment with care management team member scheduled for: 07/23/23 @ 9 am  Irving Shows [facility], BSN RN Care  Manager/ Transition of Care [place]/ [facility] Population [facility] [phone]

## 2023-07-16 NOTE — Patient Instructions (Signed)
Visit Information  Thank you for taking time to visit with me today. Please don't hesitate to contact me if I can be of assistance to you before our next scheduled telephone appointment.  Following are the goals we discussed today:   Goals Addressed             This Visit's Progress    Transition of Care/ pt will have no readmissions within 30 days       Current Barriers:  Chronic Disease Management support and education needs related to DMII and acute epididymoorchitis  Spoke with patient and spouse Steward Drone who reports patient is doing much better, has all medications and taking as prescribed Foley catheter is changed monthly at urologist, no issues reported with catheter [facility] home health is working with patient Spouse and daughter is providing wound care, states " it looks good, no drainage, only applying desitin now" to buttocks area, spouse is trying to make sure positions changed q 2 hours, patient is eating well, pt is in wheelchair at all times, spouse assists with transfers, bathing, etc. Patient saw primary care provider on 07/12/23  RNCM Clinical Goal(s):  Patient will work with the Care Management team over the next 30 days to address Transition of Care Barriers: Home Health services verbalize understanding of plan for management of DMII and acute epididymoorchitis as evidenced by patient/ spouse report, review of EMR and  through collaboration with RN Care manager, provider, and care team.   Interventions: Acute epididymoorchitis: Evaluation of current treatment plan related to  self management and patient's adherence to plan as established by provider Reviewed signs/ symptoms of infection including UTI and reportable signs/ symptoms Reviewed correlation of elevated CBG and infection, slow healing Reviewed importance of foley catheter care/ maintenance Reinforced per discharge instructions- avoid excessive plain water intake due to low sodium Reviewed adequate protein  intake at each meal- pt is drinking one glucerna daily Reinforced changing positions q 2 hours Encouraged patient to continue working with home health Reviewed upcoming scheduled appointments including 07/29/22 @ urologist  Diabetes Interventions:  (Status:  New goal. and Goal on track:  Yes.) Short Term Goal Assessed patient's understanding of A1c goal: <7% Reviewed medications with patient and discussed importance of medication adherence Counseled on importance of regular laboratory monitoring as prescribed Discussed plans with patient for ongoing care management follow up and provided patient with direct contact information for care management team Review of patient status, including review of consultants reports, relevant laboratory and other test results, and medications completed Reviewed carbohydrate modified diet Lab Results  Component Value Date   HGBA1C 8.6 (H) 04/16/2023    Patient Goals/Self-Care Activities: Participate in Transition of Care Program/Attend [facility] scheduled calls Notify RN Care Manager of TOC call rescheduling needs Take all medications as prescribed Attend all scheduled provider appointments Call pharmacy for medication refills 3-7 days in advance of running out of medications check blood sugar at prescribed times: once daily check feet daily for cuts, sores or redness enter blood sugar readings and medication or insulin into daily log take the blood sugar log to all doctor visits trim toenails straight across limit fast food meals to no more than 1 per week wash and dry feet carefully every day wear comfortable, cotton socks wear comfortable, well-fitting shoes Continue working with [facility]-  [phone] Change positions every 2 hours Consume adequate protein at each meal  Follow Up Plan:  The patient has been provided with contact information for the care management team  and has been advised to call with any health related questions or  concerns.   Telephone outreach scheduled for 07/23/23 @ 9 am         Our next appointment is by telephone on 07/23/23 at 9 am  Please call the care guide team at [phone] if you need to cancel or reschedule your appointment.   If you are experiencing a Mental Health or Behavioral Health Crisis or need someone to talk to, please call the Suicide and Crisis Lifeline: 988 call the [place] National Suicide Prevention Lifeline: 770-167-4759 or TTY: 551-165-1767 TTY 702 350 3772) to talk to a trained counselor call 1-800-273-TALK (toll free, 24 hour hotline) go to [facility] [address] [place] [phone]) call the [facility] Crisis Line: 816-775-8605 call 911   Patient verbalizes understanding of instructions and care plan provided today and agrees to view in MyChart. Active MyChart status and patient understanding of how to access instructions and care plan via MyChart confirmed with patient.     Irving Shows [facility], BSN RN Care Manager/ Transition of Care [place]/ [facility] [phone]

## 2023-07-19 ENCOUNTER — Telehealth: Payer: Self-pay | Admitting: Family Medicine

## 2023-07-19 ENCOUNTER — Ambulatory Visit: Payer: PPO

## 2023-07-19 DIAGNOSIS — G9341 Metabolic encephalopathy: Secondary | ICD-10-CM

## 2023-07-19 DIAGNOSIS — E1159 Type 2 diabetes mellitus with other circulatory complications: Secondary | ICD-10-CM

## 2023-07-19 DIAGNOSIS — I152 Hypertension secondary to endocrine disorders: Secondary | ICD-10-CM

## 2023-07-19 DIAGNOSIS — G35 Multiple sclerosis: Secondary | ICD-10-CM

## 2023-07-19 DIAGNOSIS — N453 Epididymo-orchitis: Secondary | ICD-10-CM

## 2023-07-19 DIAGNOSIS — A4101 Sepsis due to Methicillin susceptible Staphylococcus aureus: Secondary | ICD-10-CM | POA: Diagnosis not present

## 2023-07-19 DIAGNOSIS — N390 Urinary tract infection, site not specified: Secondary | ICD-10-CM

## 2023-07-19 DIAGNOSIS — A4150 Gram-negative sepsis, unspecified: Secondary | ICD-10-CM | POA: Diagnosis not present

## 2023-07-19 DIAGNOSIS — E1169 Type 2 diabetes mellitus with other specified complication: Secondary | ICD-10-CM

## 2023-07-19 DIAGNOSIS — K8689 Other specified diseases of pancreas: Secondary | ICD-10-CM

## 2023-07-19 DIAGNOSIS — I2510 Atherosclerotic heart disease of native coronary artery without angina pectoris: Secondary | ICD-10-CM

## 2023-07-19 DIAGNOSIS — A4152 Sepsis due to Pseudomonas: Secondary | ICD-10-CM

## 2023-07-19 NOTE — Telephone Encounter (Signed)
 Called and spoke with patients daughter and gave them the information for MRI

## 2023-07-19 NOTE — Telephone Encounter (Signed)
 Copied from CRM [id number]. Topic: General - Other >> Jul 17, 2023 12:47 PM Elle L wrote: Reason for CRM: The patient's daughter was returning a call regarding the patient's MRI. Her call back number is [phone].

## 2023-07-23 ENCOUNTER — Other Ambulatory Visit: Payer: Self-pay | Admitting: *Deleted

## 2023-07-23 NOTE — Patient Instructions (Signed)
 Visit Information  Thank you for taking time to visit with me today. Please don't hesitate to contact me if I can be of assistance to you before our next scheduled telephone appointment.  Following are the goals we discussed today:   Goals Addressed             This Visit's Progress    Transition of Care/ pt will have no readmissions within 30 days       Current Barriers:  Chronic Disease Management support and education needs related to DMII and acute epididymoorchitis  Spoke with patient and spouse Erminio who reports patient is doing much better, has all medications and taking as prescribed Foley catheter is changed monthly at urologist, no issues reported with catheter [facility] home health is working with patient Spouse and daughter is providing wound care, states  it looks good, no drainage, only applying desitin now to buttocks area, spouse is trying to make sure positions changed q 2 hours, patient is eating well, pt is in wheelchair at all times, spouse assists with transfers, bathing, etc. Patient saw primary care provider on 07/12/23 Per patient's daughter/ spouse- pt is doing well, continues to make progress, no new concerns reported  RNCM Clinical Goal(s):  Patient will work with the Care Management team over the next 30 days to address Transition of Care Barriers: Home Health services verbalize understanding of plan for management of DMII and acute epididymoorchitis as evidenced by patient/ spouse report, review of EMR and  through collaboration with RN Care manager, provider, and care team.   Interventions: Acute epididymoorchitis: Evaluation of current treatment plan related to  self management and patient's adherence to plan as established by provider Reviewed signs/ symptoms of infection including UTI and reportable signs/ symptoms Reinforced correlation of elevated CBG and infection, slow healing Reinforced importance of foley catheter care/ maintenance Reinforced per  discharge instructions- avoid excessive plain water  intake due to low sodium Reinforced adequate protein intake at each meal- pt is drinking one glucerna daily Reviewed changing positions q 2 hours Encouraged patient to continue working with home health Reviewed upcoming scheduled appointments including 07/29/22 @ urologist, MRI abd on 07/24/23  Diabetes Interventions:  (Status:  New goal. and Goal on track:  Yes.) Short Term Goal Assessed patient's understanding of A1c goal: <7% Reviewed medications with patient and discussed importance of medication adherence Counseled on importance of regular laboratory monitoring as prescribed Discussed plans with patient for ongoing care management follow up and provided patient with direct contact information for care management team Review of patient status, including review of consultants reports, relevant laboratory and other test results, and medications completed Reinforced carbohydrate modified diet Lab Results  Component Value Date   HGBA1C 8.6 (H) 04/16/2023    Patient Goals/Self-Care Activities: Participate in Transition of Care Program/Attend TOC scheduled calls Notify RN Care Manager of TOC call rescheduling needs Take all medications as prescribed Attend all scheduled provider appointments Call pharmacy for medication refills 3-7 days in advance of running out of medications check blood sugar at prescribed times: once daily check feet daily for cuts, sores or redness enter blood sugar readings and medication or insulin  into daily log take the blood sugar log to all doctor visits trim toenails straight across limit fast food meals to no more than 1 per week wash and dry feet carefully every day wear comfortable, cotton socks wear comfortable, well-fitting shoes Continue working with [facility]-  [phone] Complete any exercises prescribed Change positions every 2 hours  Consume adequate protein at each meal  Follow Up  Plan:  The patient has been provided with contact information for the care management team and has been advised to call with any health related questions or concerns.   Telephone outreach scheduled for 07/30/23 @ 9 am         Our next appointment is by telephone on 07/30/23 at 9 am  Please call the care guide team at [phone] if you need to cancel or reschedule your appointment.   If you are experiencing a Mental Health or Behavioral Health Crisis or need someone to talk to, please call the Suicide and Crisis Lifeline: 988 call the [place]  National Suicide Prevention Lifeline: 807-370-6577 or TTY: 763-009-0817 TTY 972-682-4044) to talk to a trained counselor call 1-800-273-TALK (toll free, 24 hour hotline) go to [facility] [address] [place] [phone]) call the [facility] Crisis Line: 816-171-6801 call 911   Patient verbalizes understanding of instructions and care plan provided today and agrees to view in MyChart. Active MyChart status and patient understanding of how to access instructions and care plan via MyChart confirmed with patient.     Mliss Creed [facility], BSN RN Care Manager/ Transition of Care [place]/ [facility] [phone]

## 2023-07-23 NOTE — Patient Outreach (Signed)
 Care Management  Transitions of Care Program Transitions of Care Post-discharge week 4   07/23/2023 Name: Jacob Fowler MRN: [id number] DOB: Jul 22, 1950  Subjective: Jacob Fowler is a 73 y.o. year old male who is a primary care patient of Gladis Mustard, FNP. The Care Management team Engaged with patient Engaged with patient by telephone to assess and address transitions of care needs.   Consent to Services:  Patient was given information about care management services, agreed to services, and gave verbal consent to participate.   Assessment: Per daughter, spouse, pt is doing well, eating well, wound healed, no issues reported with foley catheter, continues working with home health.         SDOH Interventions    Flowsheet Row Telephone from 07/02/2023 in [place] [facility] Clinical Support from 11/23/2022 in [facility] Telephone from 06/20/2022 in [facility] Patient Outreach Telephone from 03/15/2022 in [facility]  SDOH Interventions      Food Insecurity Interventions Intervention Not Indicated Intervention Not Indicated Intervention Not Indicated Intervention Not Indicated  Housing Interventions Intervention Not Indicated Intervention Not Indicated -- --  Transportation Interventions Intervention Not Indicated Intervention Not Indicated Intervention Not Indicated  [Family provides transportation] Intervention Not Indicated  Utilities Interventions Intervention Not Indicated -- -- --  Alcohol Usage Interventions -- Intervention Not Indicated (Score <7) -- --  Financial Strain Interventions -- Intervention Not Indicated -- --  Physical Activity Interventions -- Intervention Not Indicated -- --  Stress Interventions -- Intervention Not Indicated -- --  Social Connections Interventions -- Intervention Not Indicated -- --         Goals Addressed             This Visit's Progress    Transition of Care/ pt will have no readmissions within 30 days       Current Barriers:  Chronic Disease Management support and education needs related to DMII and acute epididymoorchitis  Spoke with patient and spouse Jacob Fowler who reports patient is doing much better, has all medications and taking as prescribed Foley catheter is changed monthly at urologist, no issues reported with catheter [facility] is working with patient Spouse and daughter is providing wound care, states  it looks good, no drainage, only applying desitin now to buttocks area, spouse is trying to make sure positions changed q 2 hours, patient is eating well, pt is in wheelchair at all times, spouse assists with transfers, bathing, etc. Patient saw primary care provider on 07/12/23 Per patient's daughter/ spouse- pt is doing well, continues to make progress, no new concerns reported  RNCM Clinical Goal(s):  Patient will work with the Care Management team over the next 30 days to address Transition of Care Barriers: Home Health services verbalize understanding of plan for management of DMII and acute epididymoorchitis as evidenced by patient/ spouse report, review of EMR and  through collaboration with RN Care manager, provider, and care team.   Interventions: Acute epididymoorchitis: Evaluation of current treatment plan related to  self management and patient's adherence to plan as established by provider Reviewed signs/ symptoms of infection including UTI and reportable signs/ symptoms Reinforced correlation of elevated CBG and infection, slow healing Reinforced importance of foley catheter care/ maintenance Reinforced per discharge instructions- avoid excessive plain water  intake due to low sodium Reinforced adequate protein intake at each meal- pt is drinking one glucerna daily Reviewed changing positions q 2 hours Encouraged  patient to continue  working with home health Reviewed upcoming scheduled appointments including 07/29/22 @ urologist, MRI abd on 07/24/23  Diabetes Interventions:  (Status:  New goal. and Goal on track:  Yes.) Short Term Goal Assessed patient's understanding of A1c goal: <7% Reviewed medications with patient and discussed importance of medication adherence Counseled on importance of regular laboratory monitoring as prescribed Discussed plans with patient for ongoing care management follow up and provided patient with direct contact information for care management team Review of patient status, including review of consultants reports, relevant laboratory and other test results, and medications completed Reinforced carbohydrate modified diet Lab Results  Component Value Date   HGBA1C 8.6 (H) 04/16/2023    Patient Goals/Self-Care Activities: Participate in Transition of Care Program/Attend TOC scheduled calls Notify RN Care Manager of TOC call rescheduling needs Take all medications as prescribed Attend all scheduled provider appointments Call pharmacy for medication refills 3-7 days in advance of running out of medications check blood sugar at prescribed times: once daily check feet daily for cuts, sores or redness enter blood sugar readings and medication or insulin  into daily log take the blood sugar log to all doctor visits trim toenails straight across limit fast food meals to no more than 1 per week wash and dry feet carefully every day wear comfortable, cotton socks wear comfortable, well-fitting shoes Continue working with [facility]-  [phone] Complete any exercises prescribed Change positions every 2 hours Consume adequate protein at each meal  Follow Up Plan:  The patient has been provided with contact information for the care management team and has been advised to call with any health related questions or concerns.   Telephone outreach scheduled for 07/30/23 @ 9 am         Plan: Telephone follow up appointment with care management team member scheduled for:  07/30/23 @ 9 am  Mliss Creed [facility], BSN RN Care Manager/ Transition of Care [place]/ [facility] [phone]

## 2023-07-24 ENCOUNTER — Other Ambulatory Visit: Payer: Self-pay | Admitting: Family Medicine

## 2023-07-24 ENCOUNTER — Ambulatory Visit (HOSPITAL_COMMUNITY)
Admission: RE | Admit: 2023-07-24 | Discharge: 2023-07-24 | Disposition: A | Discharge status: Home / Self Care | Payer: PPO | Source: Ambulatory Visit | Attending: Family Medicine | Admitting: Family Medicine

## 2023-07-24 DIAGNOSIS — K8689 Other specified diseases of pancreas: Secondary | ICD-10-CM | POA: Diagnosis not present

## 2023-07-24 DIAGNOSIS — R32 Unspecified urinary incontinence: Secondary | ICD-10-CM | POA: Diagnosis not present

## 2023-07-24 DIAGNOSIS — K869 Disease of pancreas, unspecified: Secondary | ICD-10-CM | POA: Diagnosis not present

## 2023-07-24 DIAGNOSIS — C787 Secondary malignant neoplasm of liver and intrahepatic bile duct: Secondary | ICD-10-CM | POA: Diagnosis not present

## 2023-07-24 DIAGNOSIS — R161 Splenomegaly, not elsewhere classified: Secondary | ICD-10-CM | POA: Diagnosis not present

## 2023-07-24 MED ORDER — GADOBUTROL 1 MMOL/ML IV SOLN
7.0000 mL | Freq: Once | INTRAVENOUS | Status: AC | PRN
  Administered 2023-07-24: 7 mL via INTRAVENOUS

## 2023-07-30 ENCOUNTER — Encounter: Payer: Self-pay | Admitting: *Deleted

## 2023-07-30 ENCOUNTER — Ambulatory Visit: Payer: PPO | Admitting: Nurse Practitioner

## 2023-07-30 ENCOUNTER — Other Ambulatory Visit: Payer: Self-pay | Admitting: *Deleted

## 2023-07-30 ENCOUNTER — Encounter: Payer: Self-pay | Admitting: Nurse Practitioner

## 2023-07-30 VITALS — BP 102/64 | HR 98 | Temp 97.1°F

## 2023-07-30 DIAGNOSIS — I25118 Atherosclerotic heart disease of native coronary artery with other forms of angina pectoris: Secondary | ICD-10-CM | POA: Diagnosis not present

## 2023-07-30 DIAGNOSIS — K219 Gastro-esophageal reflux disease without esophagitis: Secondary | ICD-10-CM

## 2023-07-30 DIAGNOSIS — E538 Deficiency of other specified B group vitamins: Secondary | ICD-10-CM

## 2023-07-30 DIAGNOSIS — E1159 Type 2 diabetes mellitus with other circulatory complications: Secondary | ICD-10-CM | POA: Diagnosis not present

## 2023-07-30 DIAGNOSIS — N401 Enlarged prostate with lower urinary tract symptoms: Secondary | ICD-10-CM

## 2023-07-30 DIAGNOSIS — E559 Vitamin D deficiency, unspecified: Secondary | ICD-10-CM

## 2023-07-30 DIAGNOSIS — E1165 Type 2 diabetes mellitus with hyperglycemia: Secondary | ICD-10-CM

## 2023-07-30 DIAGNOSIS — E039 Hypothyroidism, unspecified: Secondary | ICD-10-CM

## 2023-07-30 DIAGNOSIS — E785 Hyperlipidemia, unspecified: Secondary | ICD-10-CM | POA: Diagnosis not present

## 2023-07-30 DIAGNOSIS — E1169 Type 2 diabetes mellitus with other specified complication: Secondary | ICD-10-CM | POA: Diagnosis not present

## 2023-07-30 DIAGNOSIS — I152 Hypertension secondary to endocrine disorders: Secondary | ICD-10-CM | POA: Diagnosis not present

## 2023-07-30 DIAGNOSIS — G35 Multiple sclerosis: Secondary | ICD-10-CM | POA: Diagnosis not present

## 2023-07-30 DIAGNOSIS — N184 Chronic kidney disease, stage 4 (severe): Secondary | ICD-10-CM | POA: Diagnosis not present

## 2023-07-30 DIAGNOSIS — N138 Other obstructive and reflux uropathy: Secondary | ICD-10-CM

## 2023-07-30 DIAGNOSIS — D631 Anemia in chronic kidney disease: Secondary | ICD-10-CM

## 2023-07-30 DIAGNOSIS — R413 Other amnesia: Secondary | ICD-10-CM

## 2023-07-30 LAB — BAYER DCA HB A1C WAIVED: HB A1C (BAYER DCA - WAIVED): 9.9 % — ABNORMAL HIGH (ref 4.8–5.6)

## 2023-07-30 LAB — LIPID PANEL

## 2023-07-30 MED ORDER — FERROUS SULFATE 325 (65 FE) MG PO TABS: 325.0000 mg | ORAL_TABLET | Freq: Every day | ORAL | 1 refills | Status: DC

## 2023-07-30 MED ORDER — OZEMPIC (0.25 OR 0.5 MG/DOSE) 2 MG/3ML ~~LOC~~ SOPN: 0.2500 mg | PEN_INJECTOR | SUBCUTANEOUS | 2 refills | Status: DC

## 2023-07-30 MED ORDER — OLMESARTAN MEDOXOMIL 5 MG PO TABS: 5.0000 mg | ORAL_TABLET | Freq: Every day | ORAL | 1 refills | Status: DC

## 2023-07-30 MED ORDER — OMEPRAZOLE 20 MG PO CPDR: 20.0000 mg | DELAYED_RELEASE_CAPSULE | Freq: Every day | ORAL | 1 refills | Status: DC

## 2023-07-30 MED ORDER — LANTUS SOLOSTAR 100 UNIT/ML ~~LOC~~ SOPN: 65.0000 [IU] | PEN_INJECTOR | Freq: Every day | SUBCUTANEOUS | 11 refills | Status: DC

## 2023-07-30 MED ORDER — ISOSORBIDE MONONITRATE ER 60 MG PO TB24: ORAL_TABLET | ORAL | 1 refills | Status: DC

## 2023-07-30 NOTE — Patient Instructions (Signed)

## 2023-07-30 NOTE — Progress Notes (Signed)
 Subjective:    [name], male    DOB: 04-10-1951, 73 y.o.   MRN: [id number]   Chief Complaint: medical management of chronic issues     HPI:  Jacob Fowler is a 73 y.o. who identifies as a male who was assigned male at birth.   Social history: Lives with: wife who is his caregiver. 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 Hypertension associated with type 2 diabetes mellitus (HCC) No c/o chest pain, sob or headache. Does not heck bood pressure at home. BP Readings from Last 3 Encounters:  07/12/23 100/68  06/29/23 127/77  05/28/23 113/74     2. Atherosclerosis of native coronary artery of native heart with stable angina pectoris (HCC) Has not seen cardiology  3. Hyperlipidemia associated with type 2 diabetes mellitus (HCC) Eats whatever his family gives him to eat Lab Results  Component Value Date   CHOL 98 (L) 04/16/2023   HDL 28 (L) 04/16/2023   LDLCALC 46 04/16/2023   TRIG 134 04/16/2023   CHOLHDL 3.5 04/16/2023     4. Uncontrolled type 2 diabetes mellitus with hyperglycemia, without long-term current use of insulin  (HCC) Fasting blood running 160-250 which is an improvement. He has been on 55u for 2 months now and they are some better. Blood sugars 160-200 on average. We wanted him to meant with clinical pharmacist but that has not taken place as of yet. Lab Results  Component Value Date   HGBA1C 8.6 (H) 04/16/2023    5. Acquired hypothyroidism No issues that he is aware of  Lab Results  Component Value Date   TSH 4.810 (H) 04/16/2023     6. Memory loss They try to orient him daily. He still recognizes family  7. Vitamin D  deficiency Is on daily vitamin d  supplement  8. Multiple sclerosis (HCC) is in a wheel chair. Cannot walk anymore. Is 100% care.  9. BPH with obstruction/lower urinary tract symptoms Has indwelling  catheter and has appointment with urology for follow up next week.  10. B12 deficiency Family gives him b12 injections.   New complaints: None today  Allergies  Allergen Reactions   Farxiga [Dapagliflozin] Other (See Comments)    unable to take Farxiga due to recurrent UTIs, urinary retention and hydronephrosis    [name] [Finerenone] Other (See Comments)    hyperkalemia   Outpatient Encounter Medications as of 07/30/2023  Medication Sig   acetaminophen  (TYLENOL ) 325 MG tablet Take 2 tablets (650 mg total) by mouth every 6 (six) hours as needed for mild pain (pain score 1-3) (or Fever >/= 101).   Ascorbic Acid (VITAMIN C PO) Take 500 mg by mouth in the morning and at bedtime.   aspirin  81 MG EC tablet Take 81 mg by mouth daily.   Cholecalciferol (VITAMIN D ) 50 MCG (2000 UT) tablet Take 2,000 Units by mouth daily.   cyanocobalamin  (VITAMIN B12) 1000 MCG/ML injection INJECT 1ML INTO THE SKIN ONCE A MONTH   ferrous sulfate  325 (65 FE) MG tablet Take 1 tablet (325 mg total) by mouth daily with breakfast.   finasteride  (PROSCAR ) 5 MG tablet Take 1 tablet (5 mg total) by mouth daily.   glucose blood test strip Use as instructed   insulin  glargine (LANTUS  SOLOSTAR) 100 UNIT/ML Solostar Pen Inject 55 Units into the skin daily.   Insulin  Pen Needle 31G X 5 MM  MISC Use with lantus  pen as directed   isosorbide  mononitrate (IMDUR ) 60 MG 24 hr tablet TAKE 1/2 TABLET (30MG  TOTAL) BY MOUTH DAILY   leptosperum manuka honey (THERAHONEY) GEL gel Apply 1 Application topically daily. Apply daily to necrotic areas of Buttocks   levothyroxine  (SYNTHROID ) 88 MCG tablet Take 1 tablet (88 mcg total) by mouth daily.   liver oil-zinc  oxide (DESITIN) 40 % ointment Apply topically 2 (two) times daily. Apply Thin layer of Desitin  (Zinc  Oxide) 2 times daily and as additional application needed for soiling.   magnesium oxide (MAG-OX) 400 MG tablet Take 1 tablet by mouth daily.   olmesartan  (BENICAR ) 5 MG tablet  Take 1 tablet (5 mg total) by mouth daily.   omeprazole  (PRILOSEC) 20 MG capsule TAKE ONE CAPSULE (20MG  TOTAL) BY MOUTH DAILY   rosuvastatin  (CRESTOR ) 10 MG tablet Take 1 tablet (10 mg total) by mouth daily.   Semaglutide ,0.25 or 0.5MG /DOS, (OZEMPIC , 0.25 OR 0.5 MG/DOSE,) 2 MG/3ML SOPN Inject 0.25mg  into skin weekly for 4 weeks, then increase to 0.5mg  weekly thereafter. DX:E11.65   silodosin  (RAPAFLO ) 8 MG CAPS capsule Take 1 capsule (8 mg total) by mouth at bedtime.   sodium bicarbonate  650 MG tablet Take 650 mg by mouth 3 (three) times daily.   sodium citrate-citric acid (ORACIT) 500-334 MG/5ML solution Take 15 mLs by mouth 2 (two) times daily.   SPS, SODIUM POLYSTYRENE SULF, 15 GM/60ML suspension Take 15 mLs by mouth in the morning and at bedtime.   [DISCONTINUED] atorvastatin (LIPITOR) 10 MG tablet Take 10 mg by mouth daily.   [DISCONTINUED] niacin-simvastatin (SIMCOR) 500-20 MG 24 hr tablet Take 1 tablet by mouth at bedtime.     No facility-administered encounter medications on file as of 07/30/2023.    Past Surgical History:  Procedure Laterality Date   CARDIAC CATHETERIZATION  02-26-2009 ---  DR EVALENE LUNGER   NONOBSTRUCTIVE CAD/ 50% DISTAL RCA/ 30% LAD / NORMAL LVF   CYSTOSCOPY W/ RETROGRADES  10/30/2011   Procedure: CYSTOSCOPY WITH RETROGRADE PYELOGRAM;  Surgeon: Mark C Ottelin, MD;  Location: [facility];  Service: Urology;  Laterality: Right;   CYSTOSCOPY WITH LITHOLAPAXY  10/30/2011   Procedure: CYSTOSCOPY WITH LITHOLAPAXY;  Surgeon: Oneil JAYSON Rafter, MD;  Location: [facility];  Service: Urology;  Laterality: N/A;   CYSTOSCOPY WITH RETROGRADE PYELOGRAM, URETEROSCOPY AND STENT PLACEMENT Left 11/25/2020   Procedure: CYSTOSCOPY WITH LEFT RETROGRADE PYELOGRAM, LEFT URETEROSCOPY WITH LASER AND LEFT URETERAL STENT PLACEMENT;  Surgeon: Sherrilee Belvie CROME, MD;  Location: [facility];  Service: Urology;  Laterality: Left;   EXTRACORPOREAL SHOCK WAVE LITHOTRIPSY   09/05/2010   RIGHT   EYE SURGERY Right 08/30/2018   HOLMIUM LASER APPLICATION Left 11/25/2020   Procedure: HOLMIUM LASER APPLICATION;  Surgeon: Sherrilee Belvie CROME, MD;  Location: [facility];  Service: Urology;  Laterality: Left;   NESBIT PROCEDURE  10/17/2004   CORRECTION OF PENILE ANGULATION (PEYRONIES DISEASE)   UTI  06/17/2022    Family History  Problem Relation Age of Onset   Stroke Mother    Heart attack Mother    Diabetes Mother    Hypertension Mother    Renal cancer Father    Diabetes Sister    Heart attack Brother    Diabetes Son    Heart attack Maternal Grandfather    Heart attack Brother    Bladder Cancer Brother       Controlled substance contract: n/a     Review of Systems  Constitutional:  Negative  for diaphoresis.  Eyes:  Negative for pain.  Respiratory:  Negative for shortness of breath.   Cardiovascular:  Negative for chest pain, palpitations and leg swelling.  Gastrointestinal:  Negative for abdominal pain.  Endocrine: Negative for polydipsia.  Skin:  Negative for rash.  Neurological:  Negative for dizziness, weakness and headaches.  Hematological:  Does not bruise/bleed easily.  All other systems reviewed and are negative.      Objective:   Physical Exam Vitals and nursing note reviewed.  Constitutional:      Appearance: Normal appearance. He is well-developed.  HENT:     Head: Normocephalic.     Nose: Nose normal.     Mouth/Throat:     Mouth: Mucous membranes are moist.     Pharynx: Oropharynx is clear.  Eyes:     Pupils: Pupils are equal, round, and reactive to light.  Neck:     Thyroid : No thyroid  mass or thyromegaly.     Vascular: No carotid bruit or JVD.     Trachea: Phonation normal.  Cardiovascular:     Rate and Rhythm: Normal rate and regular rhythm.  Pulmonary:     Effort: Pulmonary effort is normal. No respiratory distress.     Breath sounds: Normal breath sounds.  Abdominal:     General: Bowel sounds are normal.      Palpations: Abdomen is soft.     Tenderness: There is no abdominal tenderness.  Musculoskeletal:        General: Normal range of motion.     Cervical back: Normal range of motion and neck supple.     Comments: In wheel chair  Lymphadenopathy:     Cervical: No cervical adenopathy.  Skin:    General: Skin is warm and dry.  Neurological:     Mental Status: He is alert and oriented to person, place, and time.  Psychiatric:        Behavior: Behavior normal.        Thought Content: Thought content normal.        Judgment: Judgment normal.     BP 102/64   Pulse 98   Temp (!) 97.1 F (36.2 C) (Temporal)   SpO2 94%   HGBA1c 9.9%       Assessment & Plan:   Jacob Fowler comes in today with chief complaint of No chief complaint on file.   Diagnosis and orders addressed:  1. Hypertension associated with type 2 diabetes mellitus (HCC) Low sodium diet - CBC with Differential/Platelet - CMP14+EGFR - isosorbide  mononitrate (IMDUR ) 60 MG 24 hr tablet; TAKE 1/2 TABLET (30MG  TOTAL) BY MOUTH DAILY  Dispense: 45 tablet; Refill: 0 - olmesartan  (BENICAR ) 5 MG tablet; Take 1 tablet (5 mg total) by mouth daily.  Dispense: 90 tablet; Refill: 1  2. Atherosclerosis of native coronary artery of native heart with stable angina pectoris (HCC)  3. Hyperlipidemia associated with type 2 diabetes mellitus (HCC) Low fat diet - Lipid panel - rosuvastatin  (CRESTOR ) 10 MG tablet; Take 1 tablet (10 mg total) by mouth daily.  Dispense: 90 tablet; Refill: 0  4. Uncontrolled type 2 diabetes mellitus with hyperglycemia, without long-term current use of insulin  (HCC) Increase lantus  to 60u for 4 days then 65 u daily - Bayer DCA Hb A1c Waived - insulin  glargine (LANTUS  SOLOSTAR) 100 UNIT/ML Solostar Pen; Inject 65 Units into the skin daily.  Dispense: 30 mL; Refill: 11 - rosuvastatin  (CRESTOR ) 10 MG tablet; Take 1 tablet (10 mg total) by mouth daily.  Dispense:  90 tablet; Refill: 0  5. Acquired  hypothyroidism - levothyroxine  (SYNTHROID ) 75 MCG tablet; TAKE ONE TABLET ( TOTAL) BY MOUTH DAILY BEFORE BREAKFAST  Dispense: 90 tablet; Refill: 0 - Thyroid  Panel With TSH  6. Memory loss [place] daily  7. Vitamin D  deficiency Continue vitamin d  daily  8. Multiple sclerosis (HCC) Keep follow up with neurology - mirabegron  ER (MYRBETRIQ ) 25 MG TB24 tablet; Take 1 tablet (25 mg total) by mouth daily.  Dispense: 30 tablet; Refill: 5  9. BPH with obstruction/lower urinary tract symptoms - finasteride  (PROSCAR ) 5 MG tablet; Take 1 tablet (5 mg total) by mouth daily.  Dispense: 90 tablet; Refill: 3  10. B12 deficiency Continue b12 injections  11. Anemia due to stage 4 chronic kidney disease (HCC) Labs oending - ferrous sulfate  325 (65 FE) MG tablet; Take 1 tablet (325 mg total) by mouth daily with breakfast.  Dispense: 90 tablet; Refill: 1  12. Benign prostatic hyperplasia with urinary obstruction Report any voiding issues - finasteride  (PROSCAR ) 5 MG tablet; Take 1 tablet (5 mg total) by mouth daily.  Dispense: 90 tablet; Refill: 3  13. Gastroesophageal reflux disease, unspecified whether esophagitis present Avoid spicy foods Do not eat 2 hours prior to bedtime - omeprazole  (PRILOSEC) 20 MG capsule; Take 1 capsule (20 mg total) by mouth daily.  Dispense: 90 capsule; Refill: 1   15. Incomplete bladder emptying Keep follow up with urology - mirabegron  ER (MYRBETRIQ ) 25 MG TB24 tablet; Take 1 tablet (25 mg total) by mouth daily.  Dispense: 30 tablet; Refill: 5   Labs pending Health Maintenance reviewed Diet and exercise encouraged  Follow up plan: 3 months   Mary-Margaret Gladis, FNP

## 2023-07-30 NOTE — Patient Instructions (Signed)
 Visit Information  Thank you for taking time to visit with me today. Please don't hesitate to contact me if I can be of assistance to you before our next scheduled telephone appointment.  Following are the goals we discussed today:   Goals Addressed             This Visit's Progress    COMPLETED: Transition of Care/ pt will have no readmissions within 30 days       Current Barriers:  Chronic Disease Management support and education needs related to DMII and acute epididymoorchitis  Spoke with patient and spouse Erminio who reports patient is doing much better, has all medications and taking as prescribed Foley catheter is changed monthly at urologist, no issues reported with catheter [facility] continues working with patient Spouse and daughter is providing wound care, states  it looks good, no drainage, only applying desitin now, completely healed to buttocks area, spouse is trying to make sure positions changed q 2 hours, patient is eating well, pt is in wheelchair at all times, spouse assists with transfers, bathing, etc. Per patient's daughter/ spouse- pt is doing well, continues to make progress, no new concerns reported CBG today after eating snack 254,  FBS over past few days 133-181, daughter would like ongoing care management for diabetes, diet, chronic health conditions  RNCM Clinical Goal(s):  Patient will work with the Care Management team over the next 30 days to address Transition of Care Barriers: Home Health services verbalize understanding of plan for management of DMII and acute epididymoorchitis as evidenced by patient/ spouse report, review of EMR and  through collaboration with RN Care manager, provider, and care team.   Interventions: Acute epididymoorchitis: Evaluation of current treatment plan related to  self management and patient's adherence to plan as established by provider Reviewed signs/ symptoms of infection including UTI and reportable signs/  symptoms Reviewed correlation of elevated CBG and infection, slow healing Reviewed importance of foley catheter care/ maintenance Reinforced per discharge instructions- avoid excessive plain water  intake due to low sodium Reviewed adequate protein intake at each meal- pt is drinking one glucerna daily Reinforced changing positions q 2 hours Encouraged patient to continue working with home health Reviewed upcoming scheduled appointments including 07/29/22 primary care provider and urologist 08/01/23 Reviewed plan of care with daughter including transfer to longitudinal care management for ongoing management Care guide scheduled telephone follow up with care manager at [facility] on 08/27/23 @ 9 am, daughter verbalizes understanding  Diabetes Interventions:  (Status:  New goal. and Goal on track:  Yes.) Short Term Goal Assessed patient's understanding of A1c goal: <7% Reviewed medications with patient and discussed importance of medication adherence Counseled on importance of regular laboratory monitoring as prescribed Discussed plans with patient for ongoing care management follow up and provided patient with direct contact information for care management team Review of patient status, including review of consultants reports, relevant laboratory and other test results, and medications completed Reviewed carbohydrate modified diet Lab Results  Component Value Date   HGBA1C 8.6 (H) 04/16/2023    Patient Goals/Self-Care Activities: Participate in Transition of Care Program/Attend [facility] scheduled calls Notify RN Care Manager of TOC call rescheduling needs Take all medications as prescribed Attend all scheduled provider appointments Call pharmacy for medication refills 3-7 days in advance of running out of medications check blood sugar at prescribed times: once daily check feet daily for cuts, sores or redness enter blood sugar readings and medication or insulin  into daily log take  the blood sugar log  to all doctor visits trim toenails straight across limit fast food meals to no more than 1 per week wash and dry feet carefully every day wear comfortable, cotton socks wear comfortable, well-fitting shoes Continue working with [facility]-  [phone] Complete any exercises prescribed Change positions every 2 hours Consume adequate protein at each meal Be mindful of carbohydrate intake at each meal RN Care Manager at primary care provider office will outreach you mid-February  Follow Up Plan:  Telephone follow up appointment with care management team member scheduled for:  RN Care Manager Rosina Forte  08/27/23 @ 9 am The patient has been provided with contact information for the care management team and has been advised to call with any health related questions or concerns.            Next appointment is by telephone on 08/27/23 at 9 am with Rosina Forte RN  Please call the care guide team at [phone] if you need to cancel or reschedule your appointment.   If you are experiencing a Mental Health or Behavioral Health Crisis or need someone to talk to, please call the Suicide and Crisis Lifeline: 988 call the [place]  National Suicide Prevention Lifeline: 518-547-8712 or TTY: 2133171936 TTY (669) 268-7324) to talk to a trained counselor call 1-800-273-TALK (toll free, 24 hour hotline) go to [facility] [address], [place] [phone]) call the [facility] Crisis Line: (774)[phone] call 911   Patient verbalizes understanding of instructions and care plan provided today and agrees to view in MyChart. Active MyChart status and patient understanding of how to access instructions and care plan via MyChart confirmed with patient.     Mliss Creed [facility], BSN RN Care Manager/ Transition of Care [place]/ [facility] [phone]

## 2023-07-30 NOTE — Patient Outreach (Addendum)
 Care Management  Transitions of Care Program Transitions of Care Post-discharge week 5   07/30/2023 Name: Jacob Fowler MRN: [id number] DOB: 08/27/1950  Subjective: Jacob Fowler is a 73 y.o. year old male who is a primary care patient of Jacob Mustard, FNP. The Care Management team Engaged with patient Engaged with patient by telephone to assess and address transitions of care needs.   Consent to Services:  Patient was given information about care management services, agreed to services, and gave verbal consent to participate.   Assessment: Per daughter, pt is doing much better, wound to bottom area is now healed, pt had MRI of abdomen on 07/24/23 but has not heard any results, will discuss with PCP at today's visit, pt continues to see urologist monthly for catheter change, continues to work with home health, would like ongoing care management for diabetes, diet, chronic health conditions.         SDOH Interventions    Flowsheet Row Telephone from 07/02/2023 in [place] [facility] DEPARTMENT Clinical Support from 11/23/2022 in [facility] Telephone from 06/20/2022 in [facility] Community Care Coordination Patient Outreach Telephone from 03/15/2022 in [facility] Community Care Coordination  SDOH Interventions      Food Insecurity Interventions Intervention Not Indicated Intervention Not Indicated Intervention Not Indicated Intervention Not Indicated  Housing Interventions Intervention Not Indicated Intervention Not Indicated -- --  Transportation Interventions Intervention Not Indicated Intervention Not Indicated Intervention Not Indicated  [Family provides transportation] Intervention Not Indicated  Utilities Interventions Intervention Not Indicated -- -- --  Alcohol Usage Interventions -- Intervention Not Indicated (Score <7) -- --  Financial Strain Interventions -- Intervention Not Indicated  -- --  Physical Activity Interventions -- Intervention Not Indicated -- --  Stress Interventions -- Intervention Not Indicated -- --  Social Connections Interventions -- Intervention Not Indicated -- --        Goals Addressed             This Visit's Progress    COMPLETED: Transition of Care/ pt will have no readmissions within 30 days       Current Barriers:  Chronic Disease Management support and education needs related to DMII and acute epididymoorchitis  Spoke with patient and spouse Jacob Fowler who reports patient is doing much better, has all medications and taking as prescribed Foley catheter is changed monthly at urologist, no issues reported with catheter [facility] continues working with patient Spouse and daughter is providing wound care, states  it looks good, no drainage, only applying desitin now, completely healed to buttocks area, spouse is trying to make sure positions changed q 2 hours, patient is eating well, pt is in wheelchair at all times, spouse assists with transfers, bathing, etc. Per patient's daughter/ spouse- pt is doing well, continues to make progress, no new concerns reported CBG today after eating snack 254,  FBS over past few days 133-181, daughter would like ongoing care management for diabetes, diet, chronic health conditions  RNCM Clinical Goal(s):  Patient will work with the Care Management team over the next 30 days to address Transition of Care Barriers: Home Health services verbalize understanding of plan for management of DMII and acute epididymoorchitis as evidenced by patient/ spouse report, review of EMR and  through collaboration with RN Care manager, provider, and care team.   Interventions: Acute epididymoorchitis: Evaluation of current treatment plan related to  self management and patient's adherence to plan as established by provider Reviewed  signs/ symptoms of infection including UTI and reportable signs/ symptoms Reviewed  correlation of elevated CBG and infection, slow healing Reviewed importance of foley catheter care/ maintenance Reinforced per discharge instructions- avoid excessive plain water  intake due to low sodium Reviewed adequate protein intake at each meal- pt is drinking one glucerna daily Reinforced changing positions q 2 hours Encouraged patient to continue working with home health Reviewed upcoming scheduled appointments including 07/29/22 primary care provider and urologist 08/01/23 Reviewed plan of care with daughter including transfer to longitudinal care management for ongoing management Care guide scheduled telephone follow up with care manager at [facility] on 08/27/23 @ 9 am, daughter verbalizes understanding Collaboration/ update sent to Rosina Forte RN  Diabetes Interventions:  (Status:  New goal. and Goal on track:  Yes.) Short Term Goal Assessed patient's understanding of A1c goal: <7% Reviewed medications with patient and discussed importance of medication adherence Counseled on importance of regular laboratory monitoring as prescribed Discussed plans with patient for ongoing care management follow up and provided patient with direct contact information for care management team Review of patient status, including review of consultants reports, relevant laboratory and other test results, and medications completed Reviewed carbohydrate modified diet Lab Results  Component Value Date   HGBA1C 8.6 (H) 04/16/2023    Patient Goals/Self-Care Activities: Participate in Transition of Care Program/Attend TOC scheduled calls Notify RN Care Manager of TOC call rescheduling needs Take all medications as prescribed Attend all scheduled provider appointments Call pharmacy for medication refills 3-7 days in advance of running out of medications check blood sugar at prescribed times: once daily check feet daily for cuts, sores or redness enter blood sugar readings and medication or insulin  into daily  log take the blood sugar log to all doctor visits trim toenails straight across limit fast food meals to no more than 1 per week wash and dry feet carefully every day wear comfortable, cotton socks wear comfortable, well-fitting shoes Continue working with [facility]-  [phone] Complete any exercises prescribed Change positions every 2 hours Consume adequate protein at each meal Be mindful of carbohydrate intake at each meal RN Care Manager at primary care provider office will outreach you mid-February  Follow Up Plan:  Telephone follow up appointment with care management team member scheduled for:  RN Care Manager Rosina Forte  08/27/23 @ 9 am The patient has been provided with contact information for the care management team and has been advised to call with any health related questions or concerns.           Plan: Telephone follow up appointment with care management team member scheduled for: RN Care Manager at primary care provider on 08/27/23 @ 9 am  Mliss Creed [facility], BSN RN Care Manager/ Transition of Care [place] Population Health [phone]

## 2023-07-31 LAB — CBC WITH DIFFERENTIAL/PLATELET
Basophils Absolute: 0 10*3/uL (ref 0.0–0.2)
Basos: 0 %
EOS (ABSOLUTE): 0.3 10*3/uL (ref 0.0–0.4)
Eos: 3 %
Hematocrit: 38.9 % (ref 37.5–51.0)
Hemoglobin: 12.1 g/dL — ABNORMAL LOW (ref 13.0–17.7)
Immature Grans (Abs): 0 10*3/uL (ref 0.0–0.1)
Immature Granulocytes: 0 %
Lymphocytes Absolute: 1.3 10*3/uL (ref 0.7–3.1)
Lymphs: 12 %
MCH: 26.6 pg (ref 26.6–33.0)
MCHC: 31.1 g/dL — ABNORMAL LOW (ref 31.5–35.7)
MCV: 86 fL (ref 79–97)
Monocytes Absolute: 0.9 10*3/uL (ref 0.1–0.9)
Monocytes: 8 %
Neutrophils Absolute: 7.9 10*3/uL — ABNORMAL HIGH (ref 1.4–7.0)
Neutrophils: 77 %
Platelets: 155 10*3/uL (ref 150–450)
RBC: 4.55 x10E6/uL (ref 4.14–5.80)
RDW: 14.8 % (ref 11.6–15.4)
WBC: 10.3 10*3/uL (ref 3.4–10.8)

## 2023-07-31 LAB — CMP14+EGFR
ALT: 22 IU/L (ref 0–44)
AST: 17 IU/L (ref 0–40)
Albumin: 3.9 g/dL (ref 3.8–4.8)
Alkaline Phosphatase: 193 IU/L — ABNORMAL HIGH (ref 44–121)
BUN/Creatinine Ratio: 17 (ref 10–24)
BUN: 32 mg/dL — ABNORMAL HIGH (ref 8–27)
CO2: 22 mmol/L (ref 20–29)
Calcium: 9.2 mg/dL (ref 8.6–10.2)
Chloride: 95 mmol/L — ABNORMAL LOW (ref 96–106)
Creatinine, Ser: 1.88 mg/dL — ABNORMAL HIGH (ref 0.76–1.27)
Globulin, Total: 2.8 g/dL (ref 1.5–4.5)
Glucose: 387 mg/dL — ABNORMAL HIGH (ref 70–99)
Potassium: 4.6 mmol/L (ref 3.5–5.2)
Sodium: 131 mmol/L — ABNORMAL LOW (ref 134–144)
Total Protein: 6.7 g/dL (ref 6.0–8.5)
eGFR: 37 mL/min/{1.73_m2} — ABNORMAL LOW (ref >59)

## 2023-07-31 LAB — THYROID PANEL WITH TSH
Free Thyroxine Index: 2.7 (ref 1.2–4.9)
T3 Uptake Ratio: 32 % (ref 24–39)
T4, Total: 8.3 ug/dL (ref 4.5–12.0)
TSH: 2.98 u[IU]/mL (ref 0.450–4.500)

## 2023-07-31 LAB — LIPID PANEL
Cholesterol, Total: 103 mg/dL (ref 100–199)
HDL: 24 mg/dL — ABNORMAL LOW (ref >39)
LDL CALC COMMENT:: 4.3 ratio (ref 0.0–5.0)
LDL Chol Calc (NIH): 50 mg/dL (ref 0–99)
Triglycerides: 174 mg/dL — ABNORMAL HIGH (ref 0–149)
VLDL Cholesterol Cal: 29 mg/dL (ref 5–40)

## 2023-08-01 ENCOUNTER — Ambulatory Visit: Payer: PPO | Admitting: Urology

## 2023-08-01 ENCOUNTER — Encounter: Payer: Self-pay | Admitting: Family Medicine

## 2023-08-01 VITALS — BP 105/75 | HR 96

## 2023-08-01 DIAGNOSIS — Z87440 Personal history of urinary (tract) infections: Secondary | ICD-10-CM | POA: Diagnosis not present

## 2023-08-01 DIAGNOSIS — R339 Retention of urine, unspecified: Secondary | ICD-10-CM | POA: Diagnosis not present

## 2023-08-01 DIAGNOSIS — N390 Urinary tract infection, site not specified: Secondary | ICD-10-CM

## 2023-08-01 NOTE — Progress Notes (Signed)
08/01/2023 4:01 PM   Bing Neighbors Fore Nov 27, 1950 [id number]  Referring provider: Bennie Pierini, FNP [address] [place],  [place] [address]  Followup BPH and hydronephrosis   HPI: Mr Bakshi is a 72yo here for followup for hydronephrosis and BPh with urinary retention. Hydronephrosis resolved with foley catheter. He gets monthly foley changes with home health. He was hospitalized 06/2023 for UTI and orchitis.    PMH: Past Medical History:  Diagnosis Date   Acquired leg length discrepancy    Bladder stone    BPH associated with nocturia    CAD (coronary artery disease) CARDIOLOGIST- DR HOCHREIN--  LAST VISIT 09-27-2011 IN EPIC   Non obstructive Cath 2010-  HX CORONARY SPASM 2004   Cataract    bilateral    GERD (gastroesophageal reflux disease)    H/O hiatal hernia    Heart murmur    Heartburn    High cholesterol    History of nephrolithiasis 2009   Hx of adenomatous colonic polyps    Hyperlipidemia    Hypertension    Movement disorder    Multiple sclerosis (HCC) DX  10/10---  NEUROLOGIST  DR Gerlene Burdock [place] ([place])   RIGHT SIDE AFFECTED MORE W/ WEAKNESS- - USES CANE   Nephrolithiasis 08/30/2020   Neuromuscular disorder (HCC)    MS   NIDDM (non-insulin dependent diabetes mellitus)    Peyronie's disease    RBBB    Renal stone RIGHT   Rosacea    Vision abnormalities     Surgical History: Past Surgical History:  Procedure Laterality Date   CARDIAC CATHETERIZATION  02-26-2009 ---  DR Julien Nordmann   NONOBSTRUCTIVE CAD/ 50% DISTAL RCA/ 30% LAD / NORMAL LVF   CYSTOSCOPY W/ RETROGRADES  10/30/2011   Procedure: CYSTOSCOPY WITH RETROGRADE PYELOGRAM;  Surgeon: Garnett Farm, MD;  Location: [facility];  Service: Urology;  Laterality: Right;   CYSTOSCOPY WITH LITHOLAPAXY  10/30/2011   Procedure: CYSTOSCOPY WITH LITHOLAPAXY;  Surgeon: Garnett Farm, MD;  Location[facility];  Service: Urology;  Laterality: N/A;    CYSTOSCOPY WITH RETROGRADE PYELOGRAM, URETEROSCOPY AND STENT PLACEMENT Left 11/25/2020   Procedure: CYSTOSCOPY WITH LEFT RETROGRADE PYELOGRAM, LEFT URETEROSCOPY WITH LASER AND LEFT URETERAL STENT PLACEMENT;  Surgeon: Malen Gauze, MD;  Location: [facility];  Service: Urology;  Laterality: Left;   EXTRACORPOREAL SHOCK WAVE LITHOTRIPSY  09/05/2010   RIGHT   EYE SURGERY Right 08/30/2018   HOLMIUM LASER APPLICATION Left 11/25/2020   Procedure: HOLMIUM LASER APPLICATION;  Surgeon: Malen Gauze, MD;  Location: [facility];  Service: Urology;  Laterality: Left;   NESBIT PROCEDURE  10/17/2004   CORRECTION OF PENILE ANGULATION (PEYRONIES DISEASE)   UTI  06/17/2022    Home Medications:  Allergies as of 08/01/2023       Reactions   Farxiga [dapagliflozin] Other (See Comments)   unable to take Marcelline Deist due to recurrent UTIs, urinary retention and hydronephrosis   Kerendia [finerenone] Other (See Comments)   hyperkalemia        Medication List        Accurate as of August 01, 2023  4:01 PM. If you have any questions, ask your nurse or doctor.          acetaminophen 325 MG tablet Commonly known as: TYLENOL Take 2 tablets (650 mg total) by mouth every 6 (six) hours as needed for mild pain (pain score 1-3) (or Fever >/= 101).   aspirin EC 81 MG tablet Take 81 mg  by mouth daily.   cyanocobalamin 1000 MCG/ML injection Commonly known as: VITAMIN B12 INJECT INTO THE SKIN ONCE A MONTH   ferrous sulfate 325 (65 FE) MG tablet Take 1 tablet (325 mg total) by mouth daily with breakfast.   finasteride 5 MG tablet Commonly known as: PROSCAR Take 1 tablet (5 mg total) by mouth daily.   glucose blood test strip Use as instructed   Insulin Pen Needle 31G X 5 MM Misc Use with lantus pen as directed   isosorbide mononitrate 60 MG 24 hr tablet Commonly known as: IMDUR TAKE 1/2 TABLET (30MG  TOTAL) BY MOUTH DAILY   Lantus SoloStar 100 UNIT/ML Solostar Pen Generic drug: insulin  glargine Inject 65 Units into the skin daily.   leptosperum manuka honey Gel gel Apply 1 Application topically daily. Apply daily to necrotic areas of Buttocks   levothyroxine 88 MCG tablet Commonly known as: SYNTHROID Take 1 tablet (88 mcg total) by mouth daily.   liver oil-zinc oxide 40 % ointment Commonly known as: DESITIN Apply topically 2 (two) times daily. Apply Thin layer of Desitin  (Zinc Oxide) 2 times daily and as additional application needed for soiling.   magnesium oxide 400 MG tablet Commonly known as: MAG-OX Take 1 tablet by mouth daily.   olmesartan 5 MG tablet Commonly known as: BENICAR Take 1 tablet (5 mg total) by mouth daily.   omeprazole 20 MG capsule Commonly known as: PRILOSEC Take 1 capsule (20 mg total) by mouth daily.   Ozempic (0.25 or 0.5 MG/DOSE) 2 MG/3ML Sopn Generic drug: Semaglutide(0.25 or 0.5MG /DOS) Inject 0.25 mg into the skin once a week.   rosuvastatin 10 MG tablet Commonly known as: CRESTOR Take 1 tablet (10 mg total) by mouth daily.   silodosin 8 MG Caps capsule Commonly known as: RAPAFLO Take 1 capsule (8 mg total) by mouth at bedtime.   sodium bicarbonate 650 MG tablet Take 650 mg by mouth 3 (three) times daily.   sodium citrate-citric acid 500-334 MG/5ML solution Commonly known as: ORACIT Take 15 mLs by mouth 2 (two) times daily.   SPS (Sodium Polystyrene Sulf) 15 GM/60ML suspension Generic drug: sodium polystyrene Take 15 mLs by mouth in the morning and at bedtime.   VITAMIN C PO Take 500 mg by mouth in the morning and at bedtime.   Vitamin D 50 MCG (2000 UT) tablet Take 2,000 Units by mouth daily.        Allergies:  Allergies  Allergen Reactions   Farxiga [Dapagliflozin] Other (See Comments)    unable to take Farxiga due to recurrent UTIs, urinary retention and hydronephrosis    Chauncey Mann [Finerenone] Other (See Comments)    hyperkalemia    Family History: Family History  Problem Relation Age of Onset    Stroke Mother    Heart attack Mother    Diabetes Mother    Hypertension Mother    Renal cancer Father    Diabetes Sister    Heart attack Brother    Diabetes Son    Heart attack Maternal Grandfather    Heart attack Brother    Bladder Cancer Brother     Social History:  reports that he quit smoking about 49 years ago. His smoking use included cigarettes. He started smoking about 56 years ago. He has a 7 pack-year smoking history. His smokeless tobacco use includes chew. He reports that he does not drink alcohol and does not use drugs.  ROS: All other review of systems were reviewed and are negative  except what is noted above in HPI  Physical Exam: BP 105/75   Pulse 96   Constitutional:  Alert and oriented, No acute distress. HEENT: [place] AT, moist mucus membranes.  Trachea midline, no masses. Cardiovascular: No clubbing, cyanosis, or edema. Respiratory: Normal respiratory effort, no increased work of breathing. GI: Abdomen is soft, nontender, nondistended, no abdominal masses GU: No CVA tenderness.  Lymph: No cervical or inguinal lymphadenopathy. Skin: No rashes, bruises or suspicious lesions. Neurologic: Grossly intact, no focal deficits, moving all 4 extremities. Psychiatric: Normal mood and affect.  Laboratory Data: Lab Results  Component Value Date   WBC 10.3 07/30/2023   HGB 12.1 (L) 07/30/2023   HCT 38.9 07/30/2023   MCV 86 07/30/2023   PLT 155 07/30/2023    Lab Results  Component Value Date   CREATININE 1.88 (H) 07/30/2023    Lab Results  Component Value Date   PSA 5.5 (H) 01/28/2014   PSA 3.37 01/06/2013   PSA 3.08 10/14/2012    No results found for: "TESTOSTERONE"  Lab Results  Component Value Date   HGBA1C 9.9 (H) 07/30/2023    Urinalysis    Component Value Date/Time   COLORURINE YELLOW 06/26/2023 1712   APPEARANCEUR CLOUDY (A) 06/26/2023 1712   APPEARANCEUR Cloudy (A) 03/14/2023 1428   LABSPEC 1.006 06/26/2023 1712   PHURINE 8.0 06/26/2023  1712   GLUCOSEU 50 (A) 06/26/2023 1712   HGBUR LARGE (A) 06/26/2023 1712   BILIRUBINUR NEGATIVE 06/26/2023 1712   BILIRUBINUR Negative 03/14/2023 1428   KETONESUR NEGATIVE 06/26/2023 1712   PROTEINUR 30 (A) 06/26/2023 1712   UROBILINOGEN negative 12/18/2014 1601   UROBILINOGEN 1.0 04/06/2010 0509   NITRITE NEGATIVE 06/26/2023 1712   LEUKOCYTESUR LARGE (A) 06/26/2023 1712    Lab Results  Component Value Date   LABMICR See below: 03/14/2023   WBCUA >30 (A) 03/14/2023   RBCUA >30 (A) 08/07/2018   LABEPIT 0-10 03/14/2023   MUCUS Present (A) 10/25/2022   BACTERIA RARE (A) 06/26/2023    Pertinent Imaging: CT 06/26/2023: Images reviewed and discussed with the patient  Results for orders placed during the hospital encounter of 10/30/11  KUB day of procedure  Narrative *RADIOLOGY REPORT*  Clinical Data: Right ureteral stone  ABDOMEN - 1 VIEW  Comparison: [facility] CT urogram dated 10/17/2011  Findings: Prior distal right ureteral calculus is not definitely visualized on the current radiograph.  Known nonobstructing right lower pole calculi are also not radiographically apparent.  Calcified phleboliths in the pelvis.  Linear calcification overlying the right sacrum corresponds to a vascular calcification.  Suspected radiopaque ingested tablets in the splenic flexure, sigmoid colon (overlying superior bladder), and cecum.  Degenerative changes of the visualized thoracolumbar spine.  IMPRESSION: Prior distal right ureteral calculus is not definitely visualized on the current radiograph.  Known nonobstructing right lower pole calculi are also not radiographically apparent.  Original Report Authenticated By: Charline Bills, M.D.  No results found for this or any previous visit.  No results found for this or any previous visit.  No results found for this or any previous visit.  Results for orders placed during the hospital encounter of 06/04/23  US  RENAL  Narrative : PROCEDURE: US RENAL  HISTORY: Patient is a 73 y/o M with hydronephrosis. Left ureteroscopy and cystoscopy 2022  COMPARISON: U/S renal 05/22/2023, 02/01/2022, abdomen 01/01/2023.  TECHNIQUE: Two-dimensional grayscale and color Doppler ultrasound of the kidneys was performed.  FINDINGS:  This is a technically difficult and limited study due to the study  being performed with the patient in the wheelchair.  The urinary bladder is minimally distended.  The right kidney measures 8.8 cm and is partially obscured by overlying bowel gas. There is fullness of the upper pole calyx.  The left kidney measures 9.7 cm. Renal cortical echotexture is normal. There is fullness of the renal pelvis without hydronephrosis. There are no stones. There are no cysts.  IMPRESSION: 1. Fullness of the left renal pelvis without hydronephrosis. This has improved when compared to the prior study.  2. Suboptimal visualization of the right kidney due to overlying bowel gas.  Thank you for allowing [place] to assist in the care of this patient.   Electronically Signed By: Lestine Box M.D. On: 06/11/2023 12:42  No results found for this or any previous visit.  No results found for this or any previous visit.  Results for orders placed in visit on 12/25/22  CT RENAL STONE STUDY  Narrative CLINICAL DATA:  Benign prostatic hypertrophy, recurrent UTIs.  EXAM: CT ABDOMEN AND PELVIS WITHOUT CONTRAST  TECHNIQUE: Multidetector CT imaging of the abdomen and pelvis was performed following the standard protocol without IV contrast.  RADIATION DOSE REDUCTION: This exam was performed according to the departmental dose-optimization program which includes automated exposure control, adjustment of the mA and/or kV according to patient size and/or use of iterative reconstruction technique.  COMPARISON:  05/01/2022 and 01/13/2021.  FINDINGS: Lower chest: 3 mm posterior left lower lobe  nodule (4/26), unchanged from 01/13/2021. Per Fleischner Society guidelines, no follow-up is necessary. Mild bibasilar subsegmental volume loss. Atherosclerotic calcification of the aorta, aortic valve and coronary arteries. Heart size normal. No pericardial or pleural effusion. Distal esophagus is grossly unremarkable. Small hiatal hernia.  Hepatobiliary: Liver and gallbladder are unremarkable. No biliary ductal dilatation.  Pancreas: Negative.  Spleen: Negative.  Adrenals/Urinary Tract: Adrenal glands are unremarkable. No urinary stones. Moderate bilateral hydronephrosis to the level of the bladder. No cause readily identified. Bladder is moderately distended.  Stomach/Bowel: Small hiatal hernia. Stomach is relatively decompressed. Small-bowel and appendix are grossly unremarkable. Stool is seen throughout the colon, indicative of constipation.  Vascular/Lymphatic: Atherosclerotic calcification of the aorta. No pathologically enlarged lymph nodes.  Reproductive: Prostate is normal in size.  Other: Small inguinal hernias contain fat. No free fluid. Mesenteries and peritoneum are unremarkable.  Musculoskeletal: Degenerative changes in the spine. Levoconvex scoliosis.  IMPRESSION: 1. Moderate bilateral hydronephrosis, unchanged, without definitive cause identified. Bladder is moderately distended. No urinary stones. 2. Aortic atherosclerosis (ICD10-I70.0). Coronary artery calcification.   Electronically Signed By: Leanna Battles M.D. On: 01/01/2023 13:03   Assessment & Plan:    1. Urinary retention (Primary) Continue indwelling foley. Followup monthly foley changes   2. Recurrent UTI -resolved. Followup 6 months    No follow-ups on file.  Wilkie Aye, MD  [facility] [place]

## 2023-08-03 ENCOUNTER — Encounter: Payer: Self-pay | Admitting: *Deleted

## 2023-08-03 ENCOUNTER — Telehealth: Payer: Self-pay | Admitting: Nurse Practitioner

## 2023-08-03 ENCOUNTER — Other Ambulatory Visit: Payer: Self-pay | Admitting: Nurse Practitioner

## 2023-08-03 DIAGNOSIS — K8689 Other specified diseases of pancreas: Secondary | ICD-10-CM

## 2023-08-03 NOTE — Telephone Encounter (Signed)
Please review MRI results and call patient with them.  Copied from CRM [id number]. Topic: General - Other >> Aug 03, 2023 10:03 AM Donita Brooks wrote: Reason for CRM: Diane from [facility] radiology is calling in regarding a MRI call report. The MRI was sent on Jan 7. Caller would like to hear from someone soon. Call back number - [phone]

## 2023-08-03 NOTE — Progress Notes (Signed)
PATIENT NAVIGATOR PROGRESS NOTE  Name: Jacob Fowler Date: 08/03/2023 MRN: [id number]  DOB: 08-08-1950   Reason for visit:  New patient appt  Comments:  Called and spoke with daughter and have scheduled Jacob Fowler with Dr Arlana Pouch on 08/07/23 at10:30am  Reviewed directions to building and parking as well as one support person allowed in the appointment but that we can call on cell phone or Facetime during visit Verbalized understanding    Time spent counseling/coordinating care: 30-45 minutes

## 2023-08-03 NOTE — Telephone Encounter (Signed)
Called Radiology and spoke with Diane. Forwarded results to PCP for further management.

## 2023-08-03 NOTE — Progress Notes (Signed)
Shared results with PCP for further management.

## 2023-08-03 NOTE — Progress Notes (Signed)
Ref oncology Mary-Margaret Daphine Deutscher, FNP

## 2023-08-06 ENCOUNTER — Other Ambulatory Visit: Payer: Self-pay | Admitting: Nurse Practitioner

## 2023-08-06 MED ORDER — ONDANSETRON HCL 4 MG PO TABS: 4.0000 mg | ORAL_TABLET | Freq: Three times a day (TID) | ORAL | 0 refills | Status: DC | PRN

## 2023-08-06 NOTE — Progress Notes (Signed)
 Meds ordered this encounter  Medications   ondansetron (ZOFRAN) 4 MG tablet    Sig: Take 1 tablet (4 mg total) by mouth every 8 (eight) hours as needed for nausea or vomiting.    Dispense:  20 tablet    Refill:  0    Supervising Provider:   Arville Care A [[id number]]   Mary-Margaret Daphine Deutscher, FNP

## 2023-08-07 ENCOUNTER — Other Ambulatory Visit (HOSPITAL_COMMUNITY): Payer: Self-pay

## 2023-08-07 ENCOUNTER — Other Ambulatory Visit: Payer: Self-pay | Admitting: *Deleted

## 2023-08-07 ENCOUNTER — Telehealth: Payer: Self-pay | Admitting: Pharmacy Technician

## 2023-08-07 ENCOUNTER — Telehealth: Payer: Self-pay | Admitting: Urology

## 2023-08-07 ENCOUNTER — Inpatient Hospital Stay: Payer: PPO | Attending: Oncology | Admitting: Oncology

## 2023-08-07 ENCOUNTER — Encounter: Payer: Self-pay | Admitting: Oncology

## 2023-08-07 ENCOUNTER — Inpatient Hospital Stay: Payer: PPO

## 2023-08-07 ENCOUNTER — Encounter: Payer: Self-pay | Admitting: Urology

## 2023-08-07 VITALS — BP 103/66 | HR 98 | Temp 98.1°F | Resp 18

## 2023-08-07 DIAGNOSIS — R932 Abnormal findings on diagnostic imaging of liver and biliary tract: Secondary | ICD-10-CM | POA: Insufficient documentation

## 2023-08-07 DIAGNOSIS — Z8051 Family history of malignant neoplasm of kidney: Secondary | ICD-10-CM | POA: Diagnosis not present

## 2023-08-07 DIAGNOSIS — R748 Abnormal levels of other serum enzymes: Secondary | ICD-10-CM | POA: Diagnosis not present

## 2023-08-07 DIAGNOSIS — I1310 Hypertensive heart and chronic kidney disease without heart failure, with stage 1 through stage 4 chronic kidney disease, or unspecified chronic kidney disease: Secondary | ICD-10-CM | POA: Insufficient documentation

## 2023-08-07 DIAGNOSIS — E538 Deficiency of other specified B group vitamins: Secondary | ICD-10-CM | POA: Insufficient documentation

## 2023-08-07 DIAGNOSIS — Z993 Dependence on wheelchair: Secondary | ICD-10-CM | POA: Diagnosis not present

## 2023-08-07 DIAGNOSIS — K869 Disease of pancreas, unspecified: Secondary | ICD-10-CM | POA: Insufficient documentation

## 2023-08-07 DIAGNOSIS — G35 Multiple sclerosis: Secondary | ICD-10-CM | POA: Diagnosis not present

## 2023-08-07 DIAGNOSIS — K8689 Other specified diseases of pancreas: Secondary | ICD-10-CM

## 2023-08-07 DIAGNOSIS — Z8052 Family history of malignant neoplasm of bladder: Secondary | ICD-10-CM | POA: Diagnosis not present

## 2023-08-07 DIAGNOSIS — Z87891 Personal history of nicotine dependence: Secondary | ICD-10-CM | POA: Insufficient documentation

## 2023-08-07 DIAGNOSIS — K769 Liver disease, unspecified: Secondary | ICD-10-CM | POA: Diagnosis not present

## 2023-08-07 DIAGNOSIS — E1122 Type 2 diabetes mellitus with diabetic chronic kidney disease: Secondary | ICD-10-CM | POA: Diagnosis not present

## 2023-08-07 DIAGNOSIS — R933 Abnormal findings on diagnostic imaging of other parts of digestive tract: Secondary | ICD-10-CM | POA: Insufficient documentation

## 2023-08-07 DIAGNOSIS — I2510 Atherosclerotic heart disease of native coronary artery without angina pectoris: Secondary | ICD-10-CM | POA: Diagnosis not present

## 2023-08-07 DIAGNOSIS — F5109 Other insomnia not due to a substance or known physiological condition: Secondary | ICD-10-CM | POA: Diagnosis not present

## 2023-08-07 DIAGNOSIS — E1165 Type 2 diabetes mellitus with hyperglycemia: Secondary | ICD-10-CM | POA: Insufficient documentation

## 2023-08-07 DIAGNOSIS — N183 Chronic kidney disease, stage 3 unspecified: Secondary | ICD-10-CM | POA: Insufficient documentation

## 2023-08-07 DIAGNOSIS — Z79899 Other long term (current) drug therapy: Secondary | ICD-10-CM | POA: Insufficient documentation

## 2023-08-07 LAB — CBC WITH DIFFERENTIAL/PLATELET
Abs Immature Granulocytes: 0.02 10*3/uL (ref 0.00–0.07)
Basophils Absolute: 0 10*3/uL (ref 0.0–0.1)
Basophils Relative: 0 %
Eosinophils Absolute: 0.2 10*3/uL (ref 0.0–0.5)
Eosinophils Relative: 3 %
HCT: 38.1 % — ABNORMAL LOW (ref 39.0–52.0)
Hemoglobin: 12.4 g/dL — ABNORMAL LOW (ref 13.0–17.0)
Immature Granulocytes: 0 %
Lymphocytes Relative: 15 %
Lymphs Abs: 1.4 10*3/uL (ref 0.7–4.0)
MCH: 26.3 pg (ref 26.0–34.0)
MCHC: 32.5 g/dL (ref 30.0–36.0)
MCV: 80.7 fL (ref 80.0–100.0)
Monocytes Absolute: 1 10*3/uL (ref 0.1–1.0)
Monocytes Relative: 11 %
Neutro Abs: 6.6 10*3/uL (ref 1.7–7.7)
Neutrophils Relative %: 71 %
Platelets: 164 10*3/uL (ref 150–400)
RBC: 4.72 MIL/uL (ref 4.22–5.81)
RDW: 15.9 % — ABNORMAL HIGH (ref 11.5–15.5)
WBC: 9.2 10*3/uL (ref 4.0–10.5)
nRBC: 0 % (ref 0.0–0.2)

## 2023-08-07 LAB — APTT: aPTT: 27 s (ref 24–36)

## 2023-08-07 LAB — COMPREHENSIVE METABOLIC PANEL
ALT: 35 U/L (ref 0–44)
AST: 23 U/L (ref 15–41)
Albumin: 4.1 g/dL (ref 3.5–5.0)
Alkaline Phosphatase: 203 U/L — ABNORMAL HIGH (ref 38–126)
Anion gap: 10 (ref 5–15)
BUN: 37 mg/dL — ABNORMAL HIGH (ref 8–23)
CO2: 24 mmol/L (ref 22–32)
Calcium: 9.6 mg/dL (ref 8.9–10.3)
Chloride: 97 mmol/L — ABNORMAL LOW (ref 98–111)
Creatinine, Ser: 1.66 mg/dL — ABNORMAL HIGH (ref 0.61–1.24)
GFR, Estimated: 43 mL/min — ABNORMAL LOW (ref >60)
Glucose, Bld: 326 mg/dL — ABNORMAL HIGH (ref 70–99)
Potassium: 4.6 mmol/L (ref 3.5–5.1)
Sodium: 131 mmol/L — ABNORMAL LOW (ref 135–145)
Total Bilirubin: 0.4 mg/dL (ref 0.0–1.2)
Total Protein: 7.3 g/dL (ref 6.5–8.1)

## 2023-08-07 LAB — PROTIME-INR
INR: 1 (ref 0.8–1.2)
Prothrombin Time: 13.1 s (ref 11.4–15.2)

## 2023-08-07 LAB — LACTATE DEHYDROGENASE: LDH: 110 U/L (ref 98–192)

## 2023-08-07 LAB — CEA (ACCESS): CEA (CHCC): 4.96 ng/mL (ref 0.00–5.00)

## 2023-08-07 MED ORDER — ALPRAZOLAM 0.5 MG PO TABS: 0.5000 mg | ORAL_TABLET | Freq: Every evening | ORAL | 0 refills | Status: DC | PRN

## 2023-08-07 NOTE — Assessment & Plan Note (Signed)
Patient has increased anxiety following the recent pancreatic mass diagnosis.   - Prescribe alprazolam (Xanax) at a low dose, to be taken as needed, not more than twice a day. Recommend taking it once at night to aid with sleep.

## 2023-08-07 NOTE — Telephone Encounter (Signed)
Message left on daughters phone to call back with the name of home health that the order needed to be sent to.

## 2023-08-07 NOTE — Telephone Encounter (Signed)
Patient daughter wants an order placed for Home health for cath changes , patient was diagnosed with stage 4 pancreatic cancer,.

## 2023-08-07 NOTE — Telephone Encounter (Signed)
Verbal order given to Marylene Land from [facility] to change foley cath q 30 day and prn if needed.

## 2023-08-07 NOTE — Patient Instructions (Signed)

## 2023-08-07 NOTE — Telephone Encounter (Signed)
Pharmacy Patient Advocate Encounter   Received notification from CoverMyMeds that prior authorization for ondansetron 4 mg is required/requested.   Insurance verification completed.   The patient is insured through [employer] .   Per test claim: PA required; PA submitted to above mentioned insurance via CoverMyMeds Key/confirmation #/EOC [id number] Status is pending

## 2023-08-07 NOTE — Assessment & Plan Note (Signed)
Patient has MS, resulting in wheelchair dependence and significant right leg weakness. This chronic condition affects overall health and mobility. - Continue current management and supportive care for MS.

## 2023-08-07 NOTE — Assessment & Plan Note (Addendum)
Patient presents with a pancreatic mass (3.8 cm) in the neck and body of the pancreas, encasing major blood vessels, with suspicious liver spots indicating possible metastasis (Stage IV).   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  We discussed tentative staging, prognosis, plan of care, treatment options.  Reviewed NCCN guidelines.  Surgery and radiation are not viable due to advanced stage and involvement of major blood vessels and liver.   Chemotherapy is the primary treatment to control disease, shrink the tumor, and prevent further spread. Discussed that chemotherapy aims to extend survival and maintain quality of life, not cure. Potential side effects include fatigue, nausea, and blood count issues.   Today we submitted request to IR for biopsy of the liver lesions.  If this is not feasible, then we will place GI consult for ERCP/EUS and biopsy for tissue confirmation.    Also request placed for PET scan for staging.    Depending on above results, we will consider starting with gemcitabine at a low dose.  If well-tolerated, we will slowly dose escalate gemcitabine and consider potentially adding Abraxane based on tolerability.   Hospice care discussed as an alternative if chemotherapy is not tolerated.  Labs today revealed hemoglobin of 12.4, normal white count and platelet count.  Creatinine 1.66, alkaline phosphatase elevated at 203.  CEA normal at 4.96.  CA 19-9 pending.  We will schedule follow-up in two weeks to review biopsy and PET scan results and formulate a detailed treatment plan.

## 2023-08-07 NOTE — Progress Notes (Signed)
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  ASSESSMENT & PLAN:   Onofrio Tessendorf is a 73 y.o. gentleman with a past medical history of multiple sclerosis, hypertension, uncontrolled type 2 diabetes mellitus, CAD, dyslipidemia, hypothyroidism, CKD stage 3, BPH, chronic indwelling Foley catheter, vitamin B12 deficiency, was referred to our clinic for pancreatic mass, suspicious for malignancy.  MRI concerning for liver metastatic disease.  Pancreatic mass Patient presents with a pancreatic mass (3.8 cm) in the neck and body of the pancreas, encasing major blood vessels, with suspicious liver spots indicating possible metastasis (Stage IV).   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  We discussed tentative staging, prognosis, plan of care, treatment options.  Reviewed NCCN guidelines.  Surgery and radiation are not viable due to advanced stage and involvement of major blood vessels and liver.   Chemotherapy is the primary treatment to control disease, shrink the tumor, and prevent further spread. Discussed that chemotherapy aims to extend survival and maintain quality of life, not cure. Potential side effects include fatigue, nausea, and blood count issues.    Today we submitted request to IR for biopsy of the liver lesions.  If this is not feasible, then we will place GI consult for ERCP/EUS and biopsy for tissue confirmation.    Also request placed for PET scan for staging.    Depending on above results, we will consider starting with gemcitabine at a low dose.  If well-tolerated, we will slowly dose escalate gemcitabine and consider potentially adding Abraxane based on tolerability.   Hospice care discussed as an alternative if chemotherapy is not tolerated.  Labs today revealed hemoglobin of 12.4, normal white count and platelet count.  Creatinine 1.66, alkaline phosphatase elevated at 203.  CEA normal at 4.96.  CA 19-9 pending.  We will schedule follow-up in two weeks to review biopsy and PET scan results and formulate a detailed treatment plan.  MS (multiple sclerosis) (HCC) Patient has MS, resulting in wheelchair dependence and significant right leg weakness. This chronic condition affects overall health and mobility. - Continue current management and supportive care for MS.  Situational insomnia Patient has increased anxiety following the recent pancreatic mass diagnosis.   - Prescribe alprazolam (Xanax) at a low dose, to be taken as needed, not more than twice a day. Recommend taking it once at night to aid with sleep.  I reviewed lab results and outside records for this visit and discussed relevant results with the patient. Diagnosis, plan of care and treatment options were also discussed in detail with the patient. Opportunity provided to ask questions and answers provided to his apparent satisfaction. Provided instructions to call our clinic with any problems, questions or concerns prior to return visit. I recommended to continue follow-up with PCP and sub-specialists. He verbalized understanding and agreed with the plan. No barriers to learning was  detected.  NCCN guidelines have been consulted in the planning of this patient's  care.  Meryl Crutch, MD  08/07/2023 1:44 PM  [facility] - A DE[facility] [place] [address] [place] [address] Dept: [phone] Dept Fax: [phone]   HISTORY OF PRESENTING ILLNESS:   Discussed the use of AI scribe software for clinical note transcription with the patient, who gave verbal consent to proceed.   I have reviewed his chart and materials related to his diagnosis extensively and collaborated history with the patient. Summary of oncologic history is as follows:  PERTINENT HISTORY:   Patient was hospitalized from 06/26/2023 until 06/29/2023 after he was brought in for increasing lethargy.  In the emergency room, he was found to have lactic acidosis, leukocytosis and UA was suggestive of UTI.  CT abdomen pelvis with contrast suggested cystitis but also incidentally showed 3.1 cm lesion in the inferior aspect of proximal pancreatic tail, concerning for pancreatic adenocarcinoma.  He was admitted to the hospital for metabolic encephalopathy related to sepsis from acute right-sided epididymoorchitis.  He was treated with antibiotics.  Blood cultures grew Pseudomonas aeruginosa and Klebsiella aerogenes.  He was eventually discharged with outpatient MRI abdomen recommendation.  He had MRI of the abdomen/MRCP on 07/24/2023.  It showed infiltrative mass of the pancreatic neck and body which effaced the pancreatic duct and encase the celiac axis, proximal branch vessels and portal confluence.  Mass measured 3.8 x 3 x 2.7 cm, findings consistent with primary pancreatic adenocarcinoma.  Also noted was numerous diffusion restricting, although ice enhancing metastasis throughout the liver, index lesion of the liver dome and hepatic segment VII measuring 1.6 x 1.5 cm.  Also noted was heterogeneously T2 hypointense lesion of the central spleen measuring 3.2 x 3 cm, most likely a hamartoma or other benign incidental  finding.  Splenomegaly was noted.  Trace pleural effusions.  Given MRI findings, his PCP referred to [place] for further evaluation and management.  Referral sent to IR for biopsy of liver lesions.  Also requested PET/CT for staging.  Given his comorbidities and general performance status, he is not a candidate for aggressive systemic treatments.  If biopsy-proven to be pancreatic adenocarcinoma, we will attempt systemic chemotherapy with reduced dose of single agent gemcitabine.  INTERVAL HISTORY:  He was accompanied by his daughter to clinic appointment today.  She is very supportive.  His wife was on the phone.  The patient reports experiencing nausea and a decreased appetite since the discovery of the mass. The patient has been wheelchair-bound for a long time due to multiple sclerosis, with more pronounced weakness in the right leg. The patient denies experiencing abdominal pain and reports regular bowel movements with no blood or black stools.  MEDICAL HISTORY:  Past Medical History:  Diagnosis Date   Acquired leg length discrepancy    Bladder stone    BPH associated with nocturia    CAD (coronary artery disease) CARDIOLOGIST- DR HOCHREIN--  LAST VISIT 09-27-2011 IN EPIC   Non obstructive Cath 2010-  HX CORONARY SPASM 2004   Cataract    bilateral    GERD (gastroesophageal reflux disease)    H/O hiatal hernia    Heart murmur    Heartburn    High cholesterol    History of nephrolithiasis 2009   Hx of adenomatous colonic polyps    Hyperlipidemia    Hypertension    Movement disorder    Multiple sclerosis (HCC) DX  10/10---  NEUROLOGIST  DR Gerlene Burdock [facility] ([place])   RIGHT SIDE AFFECTED MORE W/ WEAKNESS- - USES CANE   Nephrolithiasis 08/30/2020   Neuromuscular disorder (HCC)    MS   NIDDM (non-insulin dependent diabetes mellitus)    Peyronie's disease    RBBB    Renal stone RIGHT   Rosacea    Vision abnormalities     SURGICAL HISTORY: Past Surgical History:  Procedure  Laterality Date   CARDIAC CATHETERIZATION  02-26-2009 ---  DR Julien Nordmann   NONOBSTRUCTIVE CAD/ 50% DISTAL RCA/ 30% LAD / NORMAL LVF   CYSTOSCOPY W/ RETROGRADES  10/30/2011   Procedure: CYSTOSCOPY WITH RETROGRADE PYELOGRAM;  Surgeon: Garnett Farm, MD;  Location: [facility];  Service: Urology;  Laterality: Right;   CYSTOSCOPY WITH LITHOLAPAXY  10/30/2011   Procedure: CYSTOSCOPY WITH LITHOLAPAXY;  Surgeon: Garnett Farm, MD;  Location: [facility];  Service: Urology;  Laterality: N/A;   CYSTOSCOPY WITH RETROGRADE PYELOGRAM, URETEROSCOPY AND STENT PLACEMENT Left 11/25/2020   Procedure: CYSTOSCOPY WITH LEFT RETROGRADE PYELOGRAM, LEFT URETEROSCOPY WITH LASER AND LEFT URETERAL STENT PLACEMENT;  Surgeon: Malen Gauze, MD;  Location: [facility];  Service: Urology;  Laterality: Left;   EXTRACORPOREAL SHOCK WAVE LITHOTRIPSY  09/05/2010   RIGHT   EYE SURGERY Right 08/30/2018   HOLMIUM LASER APPLICATION Left 11/25/2020   Procedure: HOLMIUM LASER APPLICATION;  Surgeon: Malen Gauze, MD;  Location: [facility];  Service: Urology;  Laterality: Left;   NESBIT PROCEDURE  10/17/2004   CORRECTION OF PENILE ANGULATION (PEYRONIES DISEASE)   UTI  06/17/2022    SOCIAL HISTORY: Social History   Socioeconomic History   Marital status: Married    Spouse name: Steward Drone   Number of children: 2   Years of education: 13   Highest education level: Associate degree: occupational, Scientist, product/process development, or vocational program  Occupational History   Occupation: retired    Comment: self -employed farmer  Tobacco Use   Smoking status: Former    Current packs/day: 0.00    Average packs/day: 1 pack/day for 7.0 years (7.0 ttl pk-yrs)    Types: Cigarettes    Start date: 09/27/1966    Quit date: 09/26/1973    Years since quitting: 49.8   Smokeless tobacco: Current    Types: Chew   Tobacco comments:    Chewing tobacco  Vaping Use   Vaping status: Never Used  Substance and Sexual Activity    Alcohol use: No    Alcohol/week: 0.0 standard drinks of alcohol   Drug use: No   Sexual activity: Not Currently  Other Topics Concern   Not on file  Social History Narrative   Lives with wife, has MS, mobility issues.   Social Drivers of Corporate investment banker Strain: Low Risk  (07/30/2023)   Overall Financial Resource Strain (CARDIA)    Difficulty of Paying Living Expenses: Not hard at all  Food Insecurity: No Food Insecurity (08/07/2023)   Hunger Vital Sign    Worried About Running Out of Food in the Last Year: Never true    Ran Out of Food in the Last Year: Never true  Transportation Needs: No Transportation Needs (08/07/2023)   PRAPARE - Administrator, Civil Service (Medical): No    Lack of Transportation (Non-Medical): No  Physical Activity: Insufficiently Active (07/30/2023)   Exercise Vital Sign    Days of Exercise per Week: 3 days    Minutes of Exercise per Session: 30 min  Stress:  No Stress Concern Present (07/30/2023)   [employer] of Occupational Health - Occupational Stress Questionnaire    Feeling of Stress : Not at all  Social Connections: Unknown (07/30/2023)   Social Connection and Isolation Panel [NHANES]    Frequency of Communication with Friends and Family: More than three times a week    Frequency of Social Gatherings with Friends and Family: More than three times a week    Attends Religious Services: Patient declined    Database administrator or Organizations: No    Attends Banker Meetings: Never    Marital Status: Married  Catering manager Violence: Not At Risk (08/07/2023)   Humiliation, Afraid, Rape, and Kick questionnaire    Fear of Current or Ex-Partner: No    Emotionally Abused: No    Physically Abused: No    Sexually Abused: No    FAMILY HISTORY: Family History  Problem Relation Age of Onset   Stroke Mother    Heart attack Mother    Diabetes Mother    Hypertension Mother    Renal cancer Father     Diabetes Sister    Heart attack Brother    Diabetes Son    Heart attack Maternal Grandfather    Heart attack Brother    Bladder Cancer Brother     ALLERGIES:  He is allergic to farxiga [dapagliflozin] and kerendia [finerenone].  MEDICATIONS:  Current Outpatient Medications  Medication Sig Dispense Refill   acetaminophen (TYLENOL) 325 MG tablet Take 2 tablets (650 mg total) by mouth every 6 (six) hours as needed for mild pain (pain score 1-3) (or Fever >/= 101).     ALPRAZolam (XANAX) 0.5 MG tablet Take 1 tablet (0.5 mg total) by mouth at bedtime as needed for anxiety. 30 tablet 0   Ascorbic Acid (VITAMIN C PO) Take 500 mg by mouth in the morning and at bedtime.     aspirin 81 MG EC tablet Take 81 mg by mouth daily.     Cholecalciferol (VITAMIN D) 50 MCG (2000 UT) tablet Take 2,000 Units by mouth daily.     cyanocobalamin (VITAMIN B12) 1000 MCG/ML injection INJECT INTO THE SKIN ONCE A MONTH 3 mL 5   ferrous sulfate 325 (65 FE) MG tablet Take 1 tablet (325 mg total) by mouth daily with breakfast. 90 tablet 1   finasteride (PROSCAR) 5 MG tablet Take 1 tablet (5 mg total) by mouth daily. 90 tablet 3   glucose blood test strip Use as instructed 100 each 12   insulin glargine (LANTUS SOLOSTAR) 100 UNIT/ML Solostar Pen Inject 65 Units into the skin daily. 30 mL 11   Insulin Pen Needle 31G X 5 MM MISC Use with lantus pen as directed 100 each 2   isosorbide mononitrate (IMDUR) 60 MG 24 hr tablet TAKE 1/2 TABLET (30MG  TOTAL) BY MOUTH DAILY 45 tablet 1   levothyroxine (SYNTHROID) 88 MCG tablet Take 1 tablet (88 mcg total) by mouth daily. 90 tablet 3   liver oil-zinc oxide (DESITIN) 40 % ointment Apply topically 2 (two) times daily. Apply Thin layer of Desitin  (Zinc Oxide) 2 times daily and as additional application needed for soiling. 56.7 g 2   magnesium oxide (MAG-OX) 400 MG tablet Take 1 tablet by mouth daily.     olmesartan (BENICAR) 5 MG tablet Take 1 tablet (5 mg total) by mouth daily. 90  tablet 1   omeprazole (PRILOSEC) 20 MG capsule Take 1 capsule (20 mg total) by mouth daily. 90  capsule 1   ondansetron (ZOFRAN) 4 MG tablet Take 1 tablet (4 mg total) by mouth every 8 (eight) hours as needed for nausea or vomiting. 20 tablet 0   rosuvastatin (CRESTOR) 10 MG tablet Take 1 tablet (10 mg total) by mouth daily. 90 tablet 1   Semaglutide,0.25 or 0.5MG /DOS, (OZEMPIC, 0.25 OR 0.5 MG/DOSE,) 2 MG/3ML SOPN Inject 0.25 mg into the skin once a week. 3 mL 2   silodosin (RAPAFLO) 8 MG CAPS capsule Take 1 capsule (8 mg total) by mouth at bedtime. 90 capsule 3   sodium bicarbonate 650 MG tablet Take 650 mg by mouth 3 (three) times daily.     sodium citrate-citric acid (ORACIT) 500-334 MG/5ML solution Take 15 mLs by mouth 2 (two) times daily.     SPS, SODIUM POLYSTYRENE SULF, 15 GM/60ML suspension Take 15 mLs by mouth in the morning and at bedtime.     leptosperum manuka honey (THERAHONEY) GEL gel Apply 1 Application topically daily. Apply daily to necrotic areas of Buttocks (Patient not taking: Reported on 08/07/2023) 42.5 g 1   No current facility-administered medications for this visit.    REVIEW OF SYSTEMS:    Review of Systems - Oncology  All other pertinent systems were reviewed with the patient and are negative.  PHYSICAL EXAMINATION:  ECOG PERFORMANCE STATUS: 2 - Symptomatic, <50% confined to bed  Vitals:   08/07/23 1031  BP: 103/66  Pulse: 98  Resp: 18  Temp: 98.1 F (36.7 C)  SpO2: 98%   Filed Weights    Physical Exam Constitutional:      General: He is not in acute distress.    Comments: Wheelchair-bound  HENT:     Head: Normocephalic and atraumatic.  Eyes:     General: No scleral icterus.    Conjunctiva/sclera: Conjunctivae normal.  Cardiovascular:     Rate and Rhythm: Normal rate and regular rhythm.     Heart sounds: Normal heart sounds.  Pulmonary:     Effort: Pulmonary effort is normal.     Breath sounds: Normal breath sounds.  Abdominal:     General:  There is no distension.  Neurological:     Mental Status: He is alert and oriented to person, place, and time.     Comments: Bilateral lower extremity weakness noted  Psychiatric:        Mood and Affect: Mood normal.        Behavior: Behavior normal.     LABORATORY DATA:   I have reviewed the data as listed.  Results for orders placed or performed in visit on 08/07/23  APTT  Result Value Ref Range   aPTT 27 24 - 36 seconds  Protime-INR  Result Value Ref Range   Prothrombin Time 13.1 11.4 - 15.2 seconds   INR 1.0 0.8 - 1.2  CEA ([facility] ONLY  Result Value Ref Range   CEA ([facility]) 4.96 0.00 - 5.00 ng/mL  Lactate dehydrogenase  Result Value Ref Range   LDH 110 98 - 192 U/L  Comprehensive metabolic panel  Result Value Ref Range   Sodium 131 (L) 135 - 145 mmol/L   Potassium 4.6 3.5 - 5.1 mmol/L   Chloride 97 (L) 98 - 111 mmol/L   CO2 24 22 - 32 mmol/L   Glucose, Bld 326 (H) 70 - 99 mg/dL   BUN 37 (H) 8 - 23 mg/dL   Creatinine, Ser 3.32 (H) 0.61 - 1.24 mg/dL   Calcium 9.6 8.9 - 95.1 mg/dL   Total Protein  7.3 6.5 - 8.1 g/dL   Albumin 4.1 3.5 - 5.0 g/dL   AST 23 15 - 41 U/L   ALT 35 0 - 44 U/L   Alkaline Phosphatase 203 (H) 38 - 126 U/L   Total Bilirubin 0.4 0.0 - 1.2 mg/dL   GFR, Estimated 43 (L) >60 mL/min   Anion gap 10 5 - 15  CBC with Differential/Platelet  Result Value Ref Range   WBC 9.2 4.0 - 10.5 K/uL   RBC 4.72 4.22 - 5.81 MIL/uL   Hemoglobin 12.4 (L) 13.0 - 17.0 g/dL   HCT 40.9 (L) 81.1 - 91.4 %   MCV 80.7 80.0 - 100.0 fL   MCH 26.3 26.0 - 34.0 pg   MCHC 32.5 30.0 - 36.0 g/dL   RDW 78.2 (H) 95.6 - 21.3 %   Platelets 164 150 - 400 K/uL   nRBC 0.0 0.0 - 0.2 %   Neutrophils Relative % 71 %   Neutro Abs 6.6 1.7 - 7.7 K/uL   Lymphocytes Relative 15 %   Lymphs Abs 1.4 0.7 - 4.0 K/uL   Monocytes Relative 11 %   Monocytes Absolute 1.0 0.1 - 1.0 K/uL   Eosinophils Relative 3 %   Eosinophils Absolute 0.2 0.0 - 0.5 K/uL   Basophils Relative 0 %    Basophils Absolute 0.0 0.0 - 0.1 K/uL   Immature Granulocytes 0 %   Abs Immature Granulocytes 0.02 0.00 - 0.07 K/uL    RADIOGRAPHIC STUDIES:  I have personally reviewed the radiological images as listed and agree with the findings in the report.  MR ABDOMEN MRCP W WO CONTAST Result Date: 08/03/2023 CLINICAL DATA:  Characterize pancreatic lesion EXAM: MRI ABDOMEN WITHOUT AND WITH CONTRAST (INCLUDING MRCP) TECHNIQUE: Multiplanar multisequence MR imaging of the abdomen was performed both before and after the administration of intravenous contrast. Heavily T2-weighted images of the biliary and pancreatic ducts were obtained, and three-dimensional MRCP images were rendered by post processing. CONTRAST:  7mL GADAVIST GADOBUTROL 1 MMOL/ML IV SOLN COMPARISON:  CT abdomen pelvis, 06/26/2023 FINDINGS: Lower chest: Cardiomegaly.  Trace pleural effusions. Hepatobiliary: Numerous diffusion restricting, although isoenhancing metastases throughout the liver, index lesion of the liver dome, hepatic segment VII measures 1.6 x 1.5 cm (series 8, image 8). No gallstones, gallbladder wall thickening, or biliary dilatation. Pancreas: Infiltrative, ill-defined, diffusion restricting, hypoenhancing mass of the pancreatic neck and body, which effaces the pancreatic duct along its length and encases the celiac axis, proximal branch vessels, and portal confluence. Mass measures approximately 3.8 x 3.0 x 2.7 cm (series 4, image 22). Spleen: Splenomegaly, maximum coronal span 16.3 cm. Heterogeneously T2 hypointense lesion of the central spleen, without associated enhancement abnormality, measuring 3.2 x 3.0 cm (series 4, image 23). Adrenals/Urinary Tract: Adrenal glands are unremarkable. Kidneys are normal, without renal calculi, solid lesion, or hydronephrosis. Stomach/Bowel: Stomach is within normal limits. No evidence of bowel wall thickening, distention, or inflammatory changes. Vascular/Lymphatic: No significant vascular findings  are present. No enlarged abdominal lymph nodes. Other: No abdominal wall hernia or abnormality. No ascites. Musculoskeletal: No acute or significant osseous findings. IMPRESSION: 1. Infiltrative mass of the pancreatic neck and body, which effaces the pancreatic duct and encases the celiac axis, proximal branch vessels, and portal confluence. Mass measures approximately 3.8 x 3.0 x 2.7 cm. Findings are consistent with primary pancreatic adenocarcinoma. 2. Numerous diffusion restricting, although isoenhancing metastases throughout the liver, index lesion of the liver dome, hepatic segment VII measures 1.6 x 1.5 cm. 3. Heterogeneously T2 hypointense lesion  of the central spleen, without associated enhancement abnormality, measuring 3.2 x 3.0 cm. Metastasis not favored, most likely a hamartoma or other benign incidental findings. 4. Splenomegaly. 5. Cardiomegaly. 6. Trace pleural effusions. These results will be called to the ordering clinician or representative by the Radiologist Assistant, and communication documented in the PACS or [employer]. Electronically Signed   By: Jearld Lesch M.D.   On: 08/03/2023 07:30   MR 3D Recon At Scanner Result Date: 08/03/2023 CLINICAL DATA:  Characterize pancreatic lesion EXAM: MRI ABDOMEN WITHOUT AND WITH CONTRAST (INCLUDING MRCP) TECHNIQUE: Multiplanar multisequence MR imaging of the abdomen was performed both before and after the administration of intravenous contrast. Heavily T2-weighted images of the biliary and pancreatic ducts were obtained, and three-dimensional MRCP images were rendered by post processing. CONTRAST:  7mL GADAVIST GADOBUTROL 1 MMOL/ML IV SOLN COMPARISON:  CT abdomen pelvis, 06/26/2023 FINDINGS: Lower chest: Cardiomegaly.  Trace pleural effusions. Hepatobiliary: Numerous diffusion restricting, although isoenhancing metastases throughout the liver, index lesion of the liver dome, hepatic segment VII measures 1.6 x 1.5 cm (series 8, image 8). No  gallstones, gallbladder wall thickening, or biliary dilatation. Pancreas: Infiltrative, ill-defined, diffusion restricting, hypoenhancing mass of the pancreatic neck and body, which effaces the pancreatic duct along its length and encases the celiac axis, proximal branch vessels, and portal confluence. Mass measures approximately 3.8 x 3.0 x 2.7 cm (series 4, image 22). Spleen: Splenomegaly, maximum coronal span 16.3 cm. Heterogeneously T2 hypointense lesion of the central spleen, without associated enhancement abnormality, measuring 3.2 x 3.0 cm (series 4, image 23). Adrenals/Urinary Tract: Adrenal glands are unremarkable. Kidneys are normal, without renal calculi, solid lesion, or hydronephrosis. Stomach/Bowel: Stomach is within normal limits. No evidence of bowel wall thickening, distention, or inflammatory changes. Vascular/Lymphatic: No significant vascular findings are present. No enlarged abdominal lymph nodes. Other: No abdominal wall hernia or abnormality. No ascites. Musculoskeletal: No acute or significant osseous findings. IMPRESSION: 1. Infiltrative mass of the pancreatic neck and body, which effaces the pancreatic duct and encases the celiac axis, proximal branch vessels, and portal confluence. Mass measures approximately 3.8 x 3.0 x 2.7 cm. Findings are consistent with primary pancreatic adenocarcinoma. 2. Numerous diffusion restricting, although isoenhancing metastases throughout the liver, index lesion of the liver dome, hepatic segment VII measures 1.6 x 1.5 cm. 3. Heterogeneously T2 hypointense lesion of the central spleen, without associated enhancement abnormality, measuring 3.2 x 3.0 cm. Metastasis not favored, most likely a hamartoma or other benign incidental findings. 4. Splenomegaly. 5. Cardiomegaly. 6. Trace pleural effusions. These results will be called to the ordering clinician or representative by the Radiologist Assistant, and communication documented in the PACS or [employer].  Electronically Signed   By: Jearld Lesch M.D.   On: 08/03/2023 07:30    Orders Placed This Encounter  Procedures   CBC with Differential/Platelet    Standing Status:   Future    Number of Occurrences:   1    Expiration Date:   08/06/2024   Comprehensive metabolic panel    Standing Status:   Future    Number of Occurrences:   1    Expiration Date:   08/06/2024   Lactate dehydrogenase    Standing Status:   Future    Number of Occurrences:   1    Expiration Date:   08/06/2024   Cancer antigen 19-9    Standing Status:   Future    Number of Occurrences:   1    Expiration Date:   08/06/2024  CEA ([facility] ONLY    Standing Status:   Future    Number of Occurrences:   1    Expiration Date:   08/06/2024   Protime-INR    Standing Status:   Future    Number of Occurrences:   1    Expiration Date:   08/06/2024   APTT    Standing Status:   Future    Number of Occurrences:   1    Expiration Date:   08/06/2024    CODE STATUS:  Code Status History     Date Active Date Inactive Code Status Order ID Comments User Context   06/26/2023 2113 06/29/2023 2012 Full Code [id number]  Onnie Boer, MD ED   06/17/2022 1551 06/19/2022 2111 Full Code [id number]  Maurilio Lovely D, DO ED   11/30/2020 1805 12/03/2020 2151 Full Code [id number]  Cleora Fleet, MD Inpatient    Questions for Most Recent Historical Code Status (Order [id number])     Question Answer   By: Consent: discussion documented in EHR            Future Appointments  Date Time Provider Department Center  08/08/2023  2:00 PM [facility]-NM PET CT 1 [facility]  08/24/2023 11:00 AM AUR-NURSE [facility] None  08/27/2023  9:00 AM Ricky Stabs, RN [facility] None  08/29/2023  4:00 PM Sater, Pearletha Furl, MD [facility] None  10/29/2023  2:15 PM Bennie Pierini, FNP [facility] None  11/26/2023  2:30 PM [facility]-ANNUAL WELLNESS VISIT [facility] None  01/30/2024  2:40 PM McKenzie, Mardene Celeste, MD [facility] None     I spent a total of 60  minutes during this encounter with the patient including review of chart and various tests results, discussions about plan of care and coordination of care plan.  This document was completed utilizing speech recognition software. Grammatical errors, random word insertions, pronoun errors, and incomplete sentences are an occasional consequence of this system due to software limitations, ambient noise, and hardware issues. Any formal questions or concerns about the content, text or information contained within the body of this dictation should be directly addressed to the provider for clarification.

## 2023-08-08 ENCOUNTER — Ambulatory Visit (HOSPITAL_COMMUNITY)
Admission: RE | Admit: 2023-08-08 | Discharge: 2023-08-08 | Disposition: A | Discharge status: Home / Self Care | Payer: PPO | Source: Ambulatory Visit | Attending: Oncology | Admitting: Oncology

## 2023-08-08 DIAGNOSIS — I700 Atherosclerosis of aorta: Secondary | ICD-10-CM | POA: Diagnosis not present

## 2023-08-08 DIAGNOSIS — K8689 Other specified diseases of pancreas: Secondary | ICD-10-CM | POA: Diagnosis present

## 2023-08-08 DIAGNOSIS — K769 Liver disease, unspecified: Secondary | ICD-10-CM | POA: Insufficient documentation

## 2023-08-08 DIAGNOSIS — J9811 Atelectasis: Secondary | ICD-10-CM | POA: Diagnosis not present

## 2023-08-08 DIAGNOSIS — I2510 Atherosclerotic heart disease of native coronary artery without angina pectoris: Secondary | ICD-10-CM | POA: Insufficient documentation

## 2023-08-08 DIAGNOSIS — M419 Scoliosis, unspecified: Secondary | ICD-10-CM | POA: Diagnosis not present

## 2023-08-08 DIAGNOSIS — R935 Abnormal findings on diagnostic imaging of other abdominal regions, including retroperitoneum: Secondary | ICD-10-CM | POA: Diagnosis not present

## 2023-08-08 DIAGNOSIS — C259 Malignant neoplasm of pancreas, unspecified: Secondary | ICD-10-CM | POA: Insufficient documentation

## 2023-08-08 LAB — CANCER ANTIGEN 19-9: CA 19-9: 6938 U/mL — ABNORMAL HIGH (ref 0–35)

## 2023-08-08 LAB — GLUCOSE, CAPILLARY: Glucose-Capillary: 184 mg/dL — ABNORMAL HIGH (ref 70–99)

## 2023-08-08 MED ORDER — FLUDEOXYGLUCOSE F - 18 (FDG) INJECTION
8.0000 | Freq: Once | INTRAVENOUS | Status: AC
  Administered 2023-08-08: 8 via INTRAVENOUS

## 2023-08-08 NOTE — Progress Notes (Signed)
Simonne Come, MD  Claudean Kinds OK for US guided liver lesion Bx.  Abdominal MRI - image 14, series 8.  Nedra Hai       Previous Messages    ----- Message ----- From: Claudean Kinds Sent: 08/07/2023  11:53 AM EST To: Claudean Kinds; Ir Procedure Requests Subject: US biopsy ( liver)                            Procedure : US biopsy ( liver)  Reason: hepatic lesions for diagnosis Dx: Pancreatic mass [K86.89 (ICD-10-CM)]     History : MR ABDOMEN MRCP , ct abd pelvis w/ , Ct head wo, US scrotum  Provider : Meryl Crutch, MD  Provider  contact : [phone]

## 2023-08-09 ENCOUNTER — Encounter: Payer: Self-pay | Admitting: *Deleted

## 2023-08-09 NOTE — Progress Notes (Signed)
PATIENT NAVIGATOR PROGRESS NOTE  Name: Jacob [facility] Date: 08/09/2023 MRN: [id number]  DOB: [birth year]   Reason for visit:  Telephone call to coordinate care  Comments:  Called and spoke with wife Jacob Fowler, she stated that the PET scan on 1/22 went well, we are awaiting radiology review  Jacob Fowler is scheduled for Liver bx on 2/10 at [facility] and I have rescheduled his OV with Dr Arlana Pouch for 2/17  Reinforced contact number to call with any questions    Time spent counseling/coordinating care: 30-45 minutes

## 2023-08-10 NOTE — Telephone Encounter (Signed)
Pharmacy Patient Advocate Encounter  Received notification from [facility] [employer] that Prior Authorization for Ondansetron 4mg  has been DENIED.  Full denial letter will be uploaded to the media tab. See denial reason below.   PA #/Case ID/Reference #:

## 2023-08-20 ENCOUNTER — Encounter (HOSPITAL_COMMUNITY): Payer: Self-pay | Admitting: Radiology

## 2023-08-22 DIAGNOSIS — A4101 Sepsis due to Methicillin susceptible Staphylococcus aureus: Secondary | ICD-10-CM | POA: Diagnosis not present

## 2023-08-22 DIAGNOSIS — N390 Urinary tract infection, site not specified: Secondary | ICD-10-CM | POA: Diagnosis not present

## 2023-08-23 ENCOUNTER — Other Ambulatory Visit: Payer: Self-pay | Admitting: Nurse Practitioner

## 2023-08-23 DIAGNOSIS — N3000 Acute cystitis without hematuria: Secondary | ICD-10-CM

## 2023-08-23 MED ORDER — SULFAMETHOXAZOLE-TRIMETHOPRIM 800-160 MG PO TABS: 1.0000 | ORAL_TABLET | Freq: Two times a day (BID) | ORAL | 0 refills | Status: DC

## 2023-08-23 MED ORDER — NYSTATIN 100000 UNIT/GM EX POWD: 1.0000 | Freq: Three times a day (TID) | CUTANEOUS | 0 refills | Status: DC

## 2023-08-23 NOTE — Progress Notes (Signed)
 Home health sent in urine for possible uti  Meds ordered this encounter  Medications   sulfamethoxazole -trimethoprim  (BACTRIM  DS) 800-160 MG tablet    Sig: Take 1 tablet by mouth 2 (two) times daily.    Dispense:  20 tablet    Refill:  0    Supervising Provider:   MARYANNE CHEW A [[id number]]   Mary-Margaret Gladis, FNP

## 2023-08-24 ENCOUNTER — Other Ambulatory Visit: Payer: Self-pay | Admitting: Physician Assistant

## 2023-08-24 ENCOUNTER — Ambulatory Visit: Payer: PPO

## 2023-08-24 DIAGNOSIS — Z01818 Encounter for other preprocedural examination: Secondary | ICD-10-CM

## 2023-08-24 NOTE — H&P (Signed)
 Chief Complaint: Liver mass. Request is for liver mass biopsy  Referring Physician(s): Pasam,Avinash  Supervising Physician: Art Largo  Patient Status: [facility] - Out-pt  History of Present Illness: Jacob Fowler is a 73 y.o. male  outpatient. History of RBBB, HTN, HLD, GERD, CAD, BPH with chronic indwelling foley catheter, pancreatic adenocarcinoma. Team is requesting a liver biopsy for further evaluation of metastasis PET scan from 1.22.25 reads Numerous scattered hypermetabolic foci in the liver roughly corresponding to the regions of restricted diffusion in the liver shown on MRI of 07/24/2023, compatible with hepatic metastatic disease. Team is requesting a liver mass biopsy for further evaluation. Procedure approved by IR Attending Dr. Josem Nick who recommends MR Abdomen dated 1.17.25 series 8 image 14   Daughter at bedside. Patient alert and sitting up in bed,calm. Endorses a headache. Denies any fevers, headache, chest pain, SOB, cough, abdominal pain, nausea, vomiting or bleeding.   Labs pending. Patient is on 81 mg of ASA. Last dose on approximately 1 month ago.  No pertinent allergies. Patient has been NPO since 6am   Return precautions and treatment recommendations and follow-up discussed with the patient and his daughter at bedside. Both who are agreeable with the plan.   Past Medical History:  Diagnosis Date   Acquired leg length discrepancy    Bladder stone    BPH associated with nocturia    CAD (coronary artery disease) CARDIOLOGIST- DR HOCHREIN--  LAST VISIT 09-27-2011 IN EPIC   Non obstructive Cath 2010-  HX CORONARY SPASM 2004   Cataract    bilateral    GERD (gastroesophageal reflux disease)    H/O hiatal hernia    Heart murmur    Heartburn    High cholesterol    History of nephrolithiasis 2009   Hx of adenomatous colonic polyps    Hyperlipidemia    Hypertension    Movement disorder    Multiple sclerosis (HCC) DX  10/10---  NEUROLOGIST  DR  Rich Champ [facility] ([place])   RIGHT SIDE AFFECTED MORE W/ WEAKNESS- - USES CANE   Nephrolithiasis 08/30/2020   Neuromuscular disorder (HCC)    MS   NIDDM (non-insulin  dependent diabetes mellitus)    Peyronie's disease    RBBB    Renal stone RIGHT   Rosacea    Vision abnormalities     Past Surgical History:  Procedure Laterality Date   CARDIAC CATHETERIZATION  02-26-2009 ---  DR Belva Boyden   NONOBSTRUCTIVE CAD/ 50% DISTAL RCA/ 30% LAD / NORMAL LVF   CYSTOSCOPY W/ RETROGRADES  10/30/2011   Procedure: CYSTOSCOPY WITH RETROGRADE PYELOGRAM;  Surgeon: Mark C Ottelin, MD;  Location: [facility];  Service: Urology;  Laterality: Right;   CYSTOSCOPY WITH LITHOLAPAXY  10/30/2011   Procedure: CYSTOSCOPY WITH LITHOLAPAXY;  Surgeon: Alanson Alliance, MD;  Location: [facility];  Service: Urology;  Laterality: N/A;   CYSTOSCOPY WITH RETROGRADE PYELOGRAM, URETEROSCOPY AND STENT PLACEMENT Left 11/25/2020   Procedure: CYSTOSCOPY WITH LEFT RETROGRADE PYELOGRAM, LEFT URETEROSCOPY WITH LASER AND LEFT URETERAL STENT PLACEMENT;  Surgeon: Marco Severs, MD;  Location: [facility];  Service: Urology;  Laterality: Left;   EXTRACORPOREAL SHOCK WAVE LITHOTRIPSY  09/05/2010   RIGHT   EYE SURGERY Right 08/30/2018   HOLMIUM LASER APPLICATION Left 11/25/2020   Procedure: HOLMIUM LASER APPLICATION;  Surgeon: Marco Severs, MD;  Location: [facility];  Service: Urology;  Laterality: Left;   NESBIT PROCEDURE  10/17/2004   CORRECTION OF PENILE ANGULATION (PEYRONIES DISEASE)  UTI  06/17/2022    Allergies: Farxiga [dapagliflozin] and Kerendia [finerenone]  Medications: Prior to Admission medications   Medication Sig Start Date End Date Taking? Authorizing Provider  acetaminophen  (TYLENOL ) 325 MG tablet Take 2 tablets (650 mg total) by mouth every 6 (six) hours as needed for mild pain (pain score 1-3) (or Fever >/= 101). 06/29/23   Colin Dawley, MD  ALPRAZolam  (XANAX ) 0.5 MG  tablet Take 1 tablet (0.5 mg total) by mouth at bedtime as needed for anxiety. 08/07/23   Pasam, Gale Jude, MD  Ascorbic Acid (VITAMIN C PO) Take 500 mg by mouth in the morning and at bedtime.    [provider]  aspirin  81 MG EC tablet Take 81 mg by mouth daily.    [provider]  Cholecalciferol (VITAMIN D ) 50 MCG (2000 UT) tablet Take 2,000 Units by mouth daily.    [provider]  cyanocobalamin  (VITAMIN B12) 1000 MCG/ML injection INJECT 1ML INTO THE SKIN ONCE A MONTH 11/01/22   Delfina Feller, FNP  ferrous sulfate  325 (65 FE) MG tablet Take 1 tablet (325 mg total) by mouth daily with breakfast. 07/30/23   Gaylyn Keas, Mary-Margaret, FNP  finasteride  (PROSCAR ) 5 MG tablet Take 1 tablet (5 mg total) by mouth daily. 01/26/23   Delfina Feller, FNP  glucose blood test strip Use as instructed 07/06/14   Joline Ned, MD  insulin  glargine (LANTUS  SOLOSTAR) 100 UNIT/ML Solostar Pen Inject 65 Units into the skin daily. 07/30/23   Delfina Feller, FNP  Insulin  Pen Needle 31G X 5 MM MISC Use with lantus  pen as directed 04/16/23   Delfina Feller, FNP  isosorbide  mononitrate (IMDUR ) 60 MG 24 hr tablet TAKE 1/2 TABLET (30MG  TOTAL) BY MOUTH DAILY 07/30/23   Gaylyn Keas, Mary-Margaret, FNP  leptosperum manuka honey (THERAHONEY) GEL gel Apply 1 Application topically daily. Apply daily to necrotic areas of Buttocks Patient not taking: Reported on 08/07/2023 06/30/23   Colin Dawley, MD  levothyroxine  (SYNTHROID ) 88 MCG tablet Take 1 tablet (88 mcg total) by mouth daily. 04/17/23   Delfina Feller, FNP  liver oil-zinc  oxide (DESITIN) 40 % ointment Apply topically 2 (two) times daily. Apply Thin layer of Desitin  (Zinc  Oxide) 2 times daily and as additional application needed for soiling. 06/29/23   Emokpae, Courage, MD  magnesium oxide (MAG-OX) 400 MG tablet Take 1 tablet by mouth daily.    [provider]  nystatin  (MYCOSTATIN /NYSTOP ) powder Apply 1  Application topically 3 (three) times daily. 08/23/23   Gaylyn Keas Mary-Margaret, FNP  olmesartan  (BENICAR ) 5 MG tablet Take 1 tablet (5 mg total) by mouth daily. 07/30/23 07/29/24  Delfina Feller, FNP  omeprazole  (PRILOSEC) 20 MG capsule Take 1 capsule (20 mg total) by mouth daily. 07/30/23   Gaylyn Keas Mary-Margaret, FNP  ondansetron  (ZOFRAN ) 4 MG tablet Take 1 tablet (4 mg total) by mouth every 8 (eight) hours as needed for nausea or vomiting. 08/06/23   Gaylyn Keas, Mary-Margaret, FNP  rosuvastatin  (CRESTOR ) 10 MG tablet Take 1 tablet (10 mg total) by mouth daily. 04/16/23   Gaylyn Keas, Mary-Margaret, FNP  Semaglutide ,0.25 or 0.5MG /DOS, (OZEMPIC , 0.25 OR 0.5 MG/DOSE,) 2 MG/3ML SOPN Inject 0.25 mg into the skin once a week. 07/30/23   Gaylyn Keas Mary-Margaret, FNP  silodosin  (RAPAFLO ) 8 MG CAPS capsule Take 1 capsule (8 mg total) by mouth at bedtime. 04/16/23   Gaylyn Keas Mary-Margaret, FNP  sodium bicarbonate  650 MG tablet Take 650 mg by mouth 3 (three) times daily.    [provider]  sodium citrate-citric acid (ORACIT) 500-334  MG/5ML solution Take 15 mLs by mouth 2 (two) times daily. 02/06/23   [provider]  SPS, SODIUM POLYSTYRENE SULF, 15 GM/60ML suspension Take 15 mLs by mouth in the morning and at bedtime. 06/11/23   [provider]  sulfamethoxazole -trimethoprim  (BACTRIM  DS) 800-160 MG tablet Take 1 tablet by mouth 2 (two) times daily. 08/23/23   Gaylyn Keas, Mary-Margaret, FNP  atorvastatin (LIPITOR) 10 MG tablet Take 10 mg by mouth daily.  09/27/11  [provider]  niacin-simvastatin (SIMCOR) 500-20 MG 24 hr tablet Take 1 tablet by mouth at bedtime.    09/27/11  [provider]     Family History  Problem Relation Age of Onset   Stroke Mother    Heart attack Mother    Diabetes Mother    Hypertension Mother    Renal cancer Father    Diabetes Sister    Heart attack Brother    Diabetes Son    Heart attack Maternal Grandfather    Heart attack Brother    Bladder  Cancer Brother     Social History   Socioeconomic History   Marital status: Married    Spouse name: Cornelius Dill   Number of children: 2   Years of education: 13   Highest education level: Associate degree: occupational, Scientist, product/process development, or vocational program  Occupational History   Occupation: retired    Comment: self -employed farmer  Tobacco Use   Smoking status: Former    Current packs/day: 0.00    Average packs/day: 1 pack/day for 7.0 years (7.0 ttl pk-yrs)    Types: Cigarettes    Start date: 09/27/1966    Quit date: 09/26/1973    Years since quitting: 49.9   Smokeless tobacco: Current    Types: Chew   Tobacco comments:    Chewing tobacco  Vaping Use   Vaping status: Never Used  Substance and Sexual Activity   Alcohol use: No    Alcohol/week: 0.0 standard drinks of alcohol   Drug use: No   Sexual activity: Not Currently  Other Topics Concern   Not on file  Social History Narrative   Lives with wife, has MS, mobility issues.   Social Drivers of Corporate investment banker Strain: Low Risk  (08/27/2023)   Overall Financial Resource Strain (CARDIA)    Difficulty of Paying Living Expenses: Not hard at all  Food Insecurity: No Food Insecurity (08/27/2023)   Hunger Vital Sign    Worried About Running Out of Food in the Last Year: Never true    Ran Out of Food in the Last Year: Never true  Transportation Needs: No Transportation Needs (08/27/2023)   PRAPARE - Administrator, Civil Service (Medical): No    Lack of Transportation (Non-Medical): No  Physical Activity: Insufficiently Active (08/27/2023)   Exercise Vital Sign    Days of Exercise per Week: 3 days    Minutes of Exercise per Session: 40 min  Stress: No Stress Concern Present (08/27/2023)   [employer] of Occupational Health - Occupational Stress Questionnaire    Feeling of Stress : Not at all  Social Connections: Moderately Integrated (08/27/2023)   Social Connection and Isolation Panel [NHANES]     Frequency of Communication with Friends and Family: More than three times a week    Frequency of Social Gatherings with Friends and Family: More than three times a week    Attends Religious Services: More than 4 times per year    Active Member of Clubs or Organizations: No  Attends Banker Meetings: Never    Marital Status: Married     Review of Systems: A 12 point ROS discussed and pertinent positives are indicated in the HPI above.  All other systems are negative.  Review of Systems  Constitutional:  Negative for fever.  HENT:  Negative for congestion.   Respiratory:  Negative for cough and shortness of breath.   Cardiovascular:  Negative for chest pain.  Gastrointestinal:  Negative for abdominal pain.  Neurological:  Negative for headaches.  Psychiatric/Behavioral:  Negative for behavioral problems and confusion.     Vital Signs: BP 106/61   Pulse 92   Temp 98.1 F (36.7 C) (Oral)   Resp 16   Ht 5\' 6"  (1.676 m)   Wt 150 lb (68 kg)   SpO2 92%   BMI 24.21 kg/m   Advance Care Plan: The advanced care plan/surrogate decision maker was discussed at the time of visit and documented in the medical record.  daughter   Physical Exam Vitals and nursing note reviewed.  Constitutional:      Appearance: He is well-developed.  HENT:     Head: Normocephalic.     Mouth/Throat:     Mouth: Mucous membranes are dry.  Cardiovascular:     Rate and Rhythm: Normal rate and regular rhythm.  Pulmonary:     Effort: Pulmonary effort is normal.     Breath sounds: Normal breath sounds.  Musculoskeletal:        General: Normal range of motion.     Cervical back: Normal range of motion.  Skin:    General: Skin is warm and dry.  Neurological:     Mental Status: He is alert and oriented to person, place, and time. Mental status is at baseline.     Imaging: NM PET Image Initial (PI) Skull Base To Thigh Result Date: 08/16/2023 CLINICAL DATA:  Initial treatment strategy for  pancreatic mass/adenocarcinoma. EXAM: NUCLEAR MEDICINE PET SKULL BASE TO THIGH TECHNIQUE: 7.9 mCi F-18 FDG was injected intravenously. Full-ring PET imaging was performed from the skull base to thigh after the radiotracer. CT data was obtained and used for attenuation correction and anatomic localization. Fasting blood glucose: 184 mg/dl COMPARISON:  Multiple exams, including MRI 07/24/2023 and CT scan 06/26/2023 FINDINGS: Mediastinal blood pool activity: SUV max 3.0 Liver activity: SUV max NA NECK: No significant abnormal hypermetabolic activity in this region. Incidental CT findings: Bilateral common carotid atheromatous vascular calcifications. CHEST: INSERT skip chest Incidental CT findings: Coronary, aortic arch, and branch vessel atherosclerotic vascular disease. Mild atelectasis or scarring in the posterior basal segments of both lower lobes. ABDOMEN/PELVIS: Partially centrally necrotic infiltrative mass of the pancreatic body and adjacent tail measuring roughly about 5.9 by 4.0 cm with maximum SUV 6.5. Numerous scattered hypermetabolic foci in the liver roughly corresponding to the regions of restricted diffusion in the liver shown on MRI of 07/24/2023. A lesion in the dome of the right hepatic lobe has a maximum SUV of 5.4 (background liver activity proximally 3.9). A caudate lobe lesion has a maximum SUV of 7.5. Numerous other scattered lesions are present in the liver. No hypermetabolic activity in the spleen to suggest that the splenic lesion seen on MRI represents a metastatic lesion. Scattered physiologic activity in bowel. Incidental CT findings: Atherosclerosis is present, including aortoiliac atherosclerotic disease. Scattered cluster bands of calcification along the buttocks, lateral thighs, and anterior abdominal wall, possibly confluent injection granuloma sites. Foley catheter noted. SKELETON: No significant abnormal hypermetabolic activity in this region.  Incidental CT findings: Mild levoconvex  lumbar scoliosis with rotary component. IMPRESSION: 1. Partially centrally necrotic infiltrative mass of the pancreatic body and adjacent tail measuring roughly about 5.9 by 4.0 cm with maximum SUV 6.5. 2. Numerous scattered hypermetabolic foci in the liver roughly corresponding to the regions of restricted diffusion in the liver shown on MRI of 07/24/2023, compatible with hepatic metastatic disease. 3. No hypermetabolic activity in the spleen to suggest that the splenic lesion seen on MRI represents a metastatic lesion. 4. Mild atelectasis or scarring in the posterior basal segments of both lower lobes. 5. Mild levoconvex lumbar scoliosis with rotary component. 6. Aortic atherosclerosis. Electronically Signed   By: Freida Jes M.D.   On: 08/16/2023 09:44    Labs:  CBC: Recent Labs    06/28/23 0908 07/12/23 1619 07/30/23 1403 08/07/23 1149  WBC 9.7 11.5* 10.3 9.2  HGB 10.9* 12.4* 12.1* 12.4*  HCT 34.4* 40.0 38.9 38.1*  PLT 167 226 155 164    COAGS: Recent Labs    06/26/23 1403 08/07/23 1149  INR 1.0 1.0  APTT 33 27    BMP: Recent Labs    06/26/23 1403 06/27/23 0258 06/28/23 0908 07/12/23 1619 07/30/23 1403 08/07/23 1149  NA 127* 136 133* 131* 131* 131*  K 4.0 3.5 4.1 5.1 4.6 4.6  CL 92* 107 106 94* 95* 97*  CO2 22 20* 18* 22 22 24   GLUCOSE 208* 84 201* 354* 387* 326*  BUN 23 19 20 26  32* 37*  CALCIUM  9.4 8.8* 9.1 9.8 9.2 9.6  CREATININE 1.86* 1.55* 1.61* 1.59* 1.88* 1.66*  GFRNONAA 38* 47* 45*  --   --  43*    LIVER FUNCTION TESTS: Recent Labs    06/26/23 1403 07/12/23 1619 07/30/23 1403 08/07/23 1149  BILITOT 0.8 0.4 <0.2 0.4  AST 27 22 17 23   ALT 24 28 22  35  ALKPHOS 149* 188* 193* 203*  PROT 7.7 7.8 6.7 7.3  ALBUMIN 3.5 4.3 3.9 4.1    TUMOR MARKERS: Recent Labs    08/07/23 1149  CEA 4.96    Assessment and Plan:  73 y.o. Male outpatient. History of RBBB, HTN, HLD, GERD, CAD, BPH with chronic indwelling foley catheter, pancreatic  adenocarcinoma. Team is requesting a liver biopsy for further evaluation of metastasis PET scan from 1.22.25 reads Numerous scattered hypermetabolic foci in the liver roughly corresponding to the regions of restricted diffusion in the liver shown on MRI of 07/24/2023, compatible with hepatic metastatic disease. Team is requesting a liver mass biopsy for further evaluation. Procedure approved by IR Attending Dr. Josem Nick who recommends MR Abdomen dated 1.17.25 series 8 image 14   PLAN: IR Image Guided liver mass biopsy   Risks and benefits of liver mass biopsy was discussed with the patient and/or patient's family including, but not limited to bleeding, infection, damage to adjacent structures or low yield requiring additional tests.  All of the questions were answered and there is agreement to proceed.  Consent signed and in chart.  Thank you for this interesting consult.  I greatly enjoyed meeting Lajarvis Farleigh Roosevelt and look forward to participating in their care.  A copy of this report was sent to the requesting provider on this date.  Electronically Signed: Marceil Sensor, NP 08/27/2023, 12:03 PM   I spent a total of  30 Minutes   in face to face in clinical consultation, greater than 50% of which was counseling/coordinating care for liver mass biopsy

## 2023-08-27 ENCOUNTER — Other Ambulatory Visit (INDEPENDENT_AMBULATORY_CARE_PROVIDER_SITE_OTHER): Payer: PPO | Admitting: Nurse Practitioner

## 2023-08-27 ENCOUNTER — Ambulatory Visit (HOSPITAL_COMMUNITY)
Admission: RE | Admit: 2023-08-27 | Discharge: 2023-08-27 | Disposition: A | Discharge status: Home / Self Care | Payer: PPO | Source: Ambulatory Visit | Attending: Oncology | Admitting: Oncology

## 2023-08-27 ENCOUNTER — Other Ambulatory Visit: Payer: Self-pay | Admitting: Nurse Practitioner

## 2023-08-27 ENCOUNTER — Other Ambulatory Visit: Payer: Self-pay | Admitting: *Deleted

## 2023-08-27 ENCOUNTER — Telehealth: Payer: Self-pay | Admitting: Urology

## 2023-08-27 ENCOUNTER — Other Ambulatory Visit: Payer: Self-pay

## 2023-08-27 ENCOUNTER — Telehealth: Payer: Self-pay | Admitting: Nurse Practitioner

## 2023-08-27 ENCOUNTER — Encounter: Payer: Self-pay | Admitting: *Deleted

## 2023-08-27 DIAGNOSIS — Z7982 Long term (current) use of aspirin: Secondary | ICD-10-CM | POA: Diagnosis not present

## 2023-08-27 DIAGNOSIS — F17220 Nicotine dependence, chewing tobacco, uncomplicated: Secondary | ICD-10-CM | POA: Diagnosis not present

## 2023-08-27 DIAGNOSIS — N400 Enlarged prostate without lower urinary tract symptoms: Secondary | ICD-10-CM | POA: Diagnosis not present

## 2023-08-27 DIAGNOSIS — C259 Malignant neoplasm of pancreas, unspecified: Secondary | ICD-10-CM | POA: Diagnosis not present

## 2023-08-27 DIAGNOSIS — K219 Gastro-esophageal reflux disease without esophagitis: Secondary | ICD-10-CM | POA: Insufficient documentation

## 2023-08-27 DIAGNOSIS — E785 Hyperlipidemia, unspecified: Secondary | ICD-10-CM | POA: Diagnosis not present

## 2023-08-27 DIAGNOSIS — I4510 Unspecified right bundle-branch block: Secondary | ICD-10-CM | POA: Diagnosis not present

## 2023-08-27 DIAGNOSIS — R160 Hepatomegaly, not elsewhere classified: Secondary | ICD-10-CM | POA: Diagnosis not present

## 2023-08-27 DIAGNOSIS — I10 Essential (primary) hypertension: Secondary | ICD-10-CM | POA: Diagnosis not present

## 2023-08-27 DIAGNOSIS — Z79899 Other long term (current) drug therapy: Secondary | ICD-10-CM | POA: Insufficient documentation

## 2023-08-27 DIAGNOSIS — N390 Urinary tract infection, site not specified: Secondary | ICD-10-CM

## 2023-08-27 DIAGNOSIS — C787 Secondary malignant neoplasm of liver and intrahepatic bile duct: Secondary | ICD-10-CM | POA: Diagnosis not present

## 2023-08-27 DIAGNOSIS — Z8507 Personal history of malignant neoplasm of pancreas: Secondary | ICD-10-CM | POA: Insufficient documentation

## 2023-08-27 DIAGNOSIS — K8689 Other specified diseases of pancreas: Secondary | ICD-10-CM

## 2023-08-27 DIAGNOSIS — I2510 Atherosclerotic heart disease of native coronary artery without angina pectoris: Secondary | ICD-10-CM | POA: Insufficient documentation

## 2023-08-27 DIAGNOSIS — K7689 Other specified diseases of liver: Secondary | ICD-10-CM | POA: Diagnosis not present

## 2023-08-27 DIAGNOSIS — Z01818 Encounter for other preprocedural examination: Secondary | ICD-10-CM

## 2023-08-27 LAB — CBC
HCT: 33.8 % — ABNORMAL LOW (ref 39.0–52.0)
Hemoglobin: 11.5 g/dL — ABNORMAL LOW (ref 13.0–17.0)
MCH: 26.1 pg (ref 26.0–34.0)
MCHC: 34 g/dL (ref 30.0–36.0)
MCV: 76.8 fL — ABNORMAL LOW (ref 80.0–100.0)
Platelets: 188 10*3/uL (ref 150–400)
RBC: 4.4 MIL/uL (ref 4.22–5.81)
RDW: 17 % — ABNORMAL HIGH (ref 11.5–15.5)
WBC: 12.2 10*3/uL — ABNORMAL HIGH (ref 4.0–10.5)
nRBC: 0 % (ref 0.0–0.2)

## 2023-08-27 LAB — PROTIME-INR
INR: 1.1 (ref 0.8–1.2)
Prothrombin Time: 14 s (ref 11.4–15.2)

## 2023-08-27 LAB — GLUCOSE, CAPILLARY: Glucose-Capillary: 187 mg/dL — ABNORMAL HIGH (ref 70–99)

## 2023-08-27 MED ORDER — OXYCODONE HCL 5 MG PO TABS: 5.0000 mg | ORAL_TABLET | ORAL | Status: DC | PRN

## 2023-08-27 MED ORDER — MIDAZOLAM HCL 2 MG/2ML IJ SOLN
INTRAMUSCULAR | Status: AC | PRN
  Administered 2023-08-27: .5 mg via INTRAVENOUS

## 2023-08-27 MED ORDER — FOSFOMYCIN TROMETHAMINE 3 G PO PACK: 3.0000 g | PACK | Freq: Once | ORAL | 0 refills | Status: AC

## 2023-08-27 MED ORDER — FOSFOMYCIN TROMETHAMINE 3 G PO PACK: 3.0000 g | PACK | Freq: Once | ORAL | 0 refills | Status: DC

## 2023-08-27 MED ORDER — MIDAZOLAM HCL 2 MG/2ML IJ SOLN
INTRAMUSCULAR | Status: AC
  Filled 2023-08-27: qty 2

## 2023-08-27 MED ORDER — FENTANYL CITRATE (PF) 100 MCG/2ML IJ SOLN
INTRAMUSCULAR | Status: AC | PRN
  Administered 2023-08-27: 50 ug via INTRAVENOUS

## 2023-08-27 MED ORDER — SODIUM CHLORIDE 0.9% FLUSH: 3.0000 mL | INTRAVENOUS | Status: DC | PRN

## 2023-08-27 MED ORDER — LIDOCAINE HCL (PF) 1 % IJ SOLN
10.0000 mL | Freq: Once | INTRAMUSCULAR | Status: AC
  Administered 2023-08-27: 10 mL via INTRADERMAL

## 2023-08-27 MED ORDER — SODIUM CHLORIDE 0.9% FLUSH: 3.0000 mL | Freq: Two times a day (BID) | INTRAVENOUS | Status: DC

## 2023-08-27 MED ORDER — FENTANYL CITRATE (PF) 100 MCG/2ML IJ SOLN
INTRAMUSCULAR | Status: AC
  Filled 2023-08-27: qty 2

## 2023-08-27 NOTE — Addendum Note (Signed)
 Addended by: Delfina Feller on: 08/27/2023 03:51 PM   Modules accepted: Level of Service

## 2023-08-27 NOTE — Patient Outreach (Addendum)
 Care Management   Visit Note  08/27/2023 Name: Jacob Fowler MRN: [id number] DOB: 02/20/1951  Subjective: Jacob Fowler is a 73 y.o. year old male who is a primary care patient of Delfina Feller, FNP. The Care Management team was consulted for assistance.      Engaged with patient spoke with the family member (POA, [place], [place]).   Goals Addressed             This Visit's Progress    RNCM Care Management Expected Outcomes: Monitor, Self-Manage and Reduce Symptoms of: DM, HTN, HLD       Current Barriers:  Chronic Disease Management support and education needs related to HTN, HLD, and DMII   RNCM Clinical Goal(s):  Patient will verbalize basic understanding of  HTN, HLD, and DMII disease process and self health management plan as evidenced by verbal explanation, recognizing symptoms, lifestyle modifications attend all scheduled medical appointments: with primary care provider and specialist as evidenced by keeping all scheduled appointments demonstrate Improved and Ongoing adherence to prescribed treatment plan for HTN, HLD, and DMII as evidenced by consistent medication compliance, symptom monitoring, continued lifestyle changes continue to work with RN Care Manager to address care management and care coordination needs related to  HTN, HLD, and COPD as evidenced by adherence to CM Team Scheduled appointments through collaboration with RN Care manager, provider, and care team.   Interventions: Evaluation of current treatment plan related to  self management and patient's adherence to plan as established by provider   Diabetes Interventions:  (Status:  Goal on track:  Yes.) Long Term Goal Assessed patient's understanding of A1c goal: <7% Provided education to patient about basic DM disease process. RNCM reviewed goals for fasting glucose and diet plan, daughter verbalized understanding. Education/support provided.  Reviewed medications with patient and  discussed importance of medication adherence. Reports compliance with all medications. Counseled on importance of regular laboratory monitoring as prescribed. Labs  up to date. Discussed plans with patient for ongoing care management follow up and provided patient with direct contact information for care management team Provided patient with written educational materials related to hypo and hyperglycemia and importance of correct treatment. Denies any hypo/hyperglycemic events. Reviewed scheduled/upcoming provider appointments including: 10-29-23 with PCP Advised patient, providing education and rationale, to check cbg fasting daily and record, calling provider for findings outside established parameters. RNCM reviewed with Mitzi, fasting goal <130 & post prandial <180. Review of patient status, including review of consultants reports, relevant laboratory and other test results, and medications completed Screening for signs and symptoms of depression related to chronic disease state  Assessed social determinant of health barriers Lab Results  Component Value Date   HGBA1C 9.9 (H) 07/30/2023    Hyperlipidemia Interventions:  (Status:  Goal on track:  Yes.) Long Term Goal  Lab Results  Component Value Date   CHOL 103 07/30/2023   HDL 24 (L) 07/30/2023   LDLCALC 50 07/30/2023   TRIG 174 (H) 07/30/2023   CHOLHDL 4.3 07/30/2023     Medication review performed; medication list updated in electronic medical record.  Provider established cholesterol goals reviewed Counseled on importance of regular laboratory monitoring as prescribed Provided HLD educational materials Reviewed role and benefits of statin for ASCVD risk reduction. Reports compliance with Crestor . Discussed strategies to manage statin-induced myalgias. Denies myalgias. Reviewed importance of limiting foods high in cholesterol Reviewed exercise goals and target of 150 minutes per week Screening for signs and symptoms of  depression related to chronic disease state  Assessed social determinant of health barriers   Hypertension Interventions:  (Status:  Goal on track:  Yes.) Long Term Goal Last practice recorded BP readings:  BP Readings from Last 3 Encounters:  08/07/23 103/66  08/01/23 105/75  07/30/23 102/64   Most recent eGFR/CrCl:  Lab Results  Component Value Date   EGFR 37 (L) 07/30/2023    No components found for: "CRCL"  Evaluation of current treatment plan related to hypertension self management and patient's adherence to plan as established by provider. Does not check blood pressure regularly.  Provided education to patient re: stroke prevention, s/s of heart attack and stroke. Support and education provided. Reviewed medications with patient and discussed importance of compliance. Reports compliance with all medications. Counseled on adverse effects of illicit drug and excessive alcohol use in patients with high blood pressure. Denies illicit drug/excessive alcohol use. Counseled on the importance of exercise goals with target of 150 minutes per week. Modified exercise plan with pedal bike and weights. Discussed plans with patient for ongoing care management follow up and provided patient with direct contact information for care management team Advised patient, providing education and rationale, to monitor blood pressure daily and record, calling PCP for findings outside established parameters Reviewed scheduled/upcoming provider appointments including: 10-29-23 with PCP Advised patient to discuss new changes with provider Provided education on prescribed diet Low fat/Carb Mod. Education provided. Discussed complications of poorly controlled blood pressure such as heart disease, stroke, circulatory complications, vision complications, kidney impairment, sexual dysfunction Screening for signs and symptoms of depression related to chronic disease state  Assessed social determinant of health  barriers  Patient Goals/Self-Care Activities: Take all medications as prescribed Attend all scheduled provider appointments Call pharmacy for medication refills 3-7 days in advance of running out of medications Attend church or other social activities Perform all self care activities independently  Perform IADL's (shopping, preparing meals, housekeeping, managing finances) independently Call provider office for new concerns or questions  schedule appointment with eye doctor check blood sugar at prescribed times: once daily and when you have symptoms of low or high blood sugar check feet daily for cuts, sores or redness take the blood sugar log to all doctor visits fill half of plate with vegetables keep a food diary manage portion size keep feet up while sitting wash and dry feet carefully every day wear comfortable, cotton socks check blood pressure daily write blood pressure results in a log or diary take blood pressure log to all doctor appointments call doctor for signs and symptoms of high blood pressure take all medications exactly as prescribed  Follow Up Plan:  Telephone follow up appointment with care management team member scheduled for:  10-01-2023 at 1:45 pm             Consent to Services:  Patient was given information about care management services, agreed to services, and gave verbal consent to participate.   Plan: Telephone follow up appointment with care management team member scheduled for:10-01-2023 at 1:45 pm  Abron Hoist, BSN RN [facility], [facility] RN Care Manager Direct Dial: [phone]  Fax: [phone]

## 2023-08-27 NOTE — Telephone Encounter (Signed)
 Jacob Fowler called and spoke with daughter and she states that medication is only $35 and she would pay for the medication

## 2023-08-27 NOTE — Patient Instructions (Addendum)
 Care Management   Initial Visit Note  08/27/2023 Name: Jacob Fowler MRN: [id number] DOB: [birth year]  Jacob Fowler is enrolled in a Managed Medicaid plan: No. Outreach attempt today was successful.   Subjective:   Objective:  Assessment: [name] is a 73 y.o. year old male who sees Delfina Feller, FNP for primary care. The care management team was consulted for assistance with care management and care coordination needs related to Disease Management.   Review of patient status, including review of consultants reports, relevant laboratory and other test results, and collaboration with appropriate care team members and the patient's provider was performed as part of comprehensive patient evaluation and provision of care management services.    SDOH (Social Determinants of Health) screening performed today. See Care Plan Entry related to challenges with: None   Goals Addressed             This Visit's Progress    RNCM Care Management Expected Outcomes: Monitor, Self-Manage and Reduce Symptoms of: DM, HTN, HLD       Current Barriers:  Chronic Disease Management support and education needs related to HTN, HLD, and DMII   RNCM Clinical Goal(s):  Patient will verbalize basic understanding of  HTN, HLD, and DMII disease process and self health management plan as evidenced by verbal explanation, recognizing symptoms, lifestyle modifications attend all scheduled medical appointments: with primary care provider and specialist as evidenced by keeping all scheduled appointments demonstrate Improved and Ongoing adherence to prescribed treatment plan for HTN, HLD, and DMII as evidenced by consistent medication compliance, symptom monitoring, continued lifestyle changes continue to work with RN Care Manager to address care management and care coordination needs related to  HTN, HLD, and COPD as evidenced by adherence to CM Team Scheduled appointments  through collaboration with RN Care manager, provider, and care team.   Interventions: Evaluation of current treatment plan related to  self management and patient's adherence to plan as established by provider   Diabetes Interventions:  (Status:  Goal on track:  Yes.) Long Term Goal Assessed patient's understanding of A1c goal: <7% Provided education to patient about basic DM disease process. RNCM reviewed goals for fasting glucose and diet plan, daughter verbalized understanding. Education/support provided.  Reviewed medications with patient and discussed importance of medication adherence. Reports compliance with all medications. Counseled on importance of regular laboratory monitoring as prescribed. Labs  up to date. Discussed plans with patient for ongoing care management follow up and provided patient with direct contact information for care management team Provided patient with written educational materials related to hypo and hyperglycemia and importance of correct treatment. Denies any hypo/hyperglycemic events. Reviewed scheduled/upcoming provider appointments including: 10-29-23 with PCP Advised patient, providing education and rationale, to check cbg fasting daily and record, calling provider for findings outside established parameters. RNCM reviewed with Mitzi, fasting goal <130 & post prandial <180. Review of patient status, including review of consultants reports, relevant laboratory and other test results, and medications completed Screening for signs and symptoms of depression related to chronic disease state  Assessed social determinant of health barriers Lab Results  Component Value Date   HGBA1C 9.9 (H) 07/30/2023    Hyperlipidemia Interventions:  (Status:  Goal on track:  Yes.) Long Term Goal  Lab Results  Component Value Date   CHOL 103 07/30/2023   HDL 24 (L) 07/30/2023   LDLCALC 50 07/30/2023   TRIG 174 (H) 07/30/2023   CHOLHDL 4.3 07/30/2023     Medication  review  performed; medication list updated in electronic medical record.  Provider established cholesterol goals reviewed Counseled on importance of regular laboratory monitoring as prescribed Provided HLD educational materials Reviewed role and benefits of statin for ASCVD risk reduction. Reports compliance with Crestor . Discussed strategies to manage statin-induced myalgias. Denies myalgias. Reviewed importance of limiting foods high in cholesterol Reviewed exercise goals and target of 150 minutes per week Screening for signs and symptoms of depression related to chronic disease state Assessed social determinant of health barriers   Hypertension Interventions:  (Status:  Goal on track:  Yes.) Long Term Goal Last practice recorded BP readings:  BP Readings from Last 3 Encounters:  08/07/23 103/66  08/01/23 105/75  07/30/23 102/64   Most recent eGFR/CrCl:  Lab Results  Component Value Date   EGFR 37 (L) 07/30/2023    No components found for: "CRCL"  Evaluation of current treatment plan related to hypertension self management and patient's adherence to plan as established by provider. Does not check blood pressure regularly.  Provided education to patient re: stroke prevention, s/s of heart attack and stroke. Support and education provided. Reviewed medications with patient and discussed importance of compliance. Reports compliance with all medications. Counseled on adverse effects of illicit drug and excessive alcohol use in patients with high blood pressure. Denies illicit drug/excessive alcohol use. Counseled on the importance of exercise goals with target of 150 minutes per week. Modified exercise plan with pedal bike and weights. Discussed plans with patient for ongoing care management follow up and provided patient with direct contact information for care management team Advised patient, providing education and rationale, to monitor blood pressure daily and record, calling PCP for  findings outside established parameters Reviewed scheduled/upcoming provider appointments including: 10-29-23 with PCP Advised patient to discuss new changes with provider Provided education on prescribed diet Low fat/Carb Mod. Education provided. Discussed complications of poorly controlled blood pressure such as heart disease, stroke, circulatory complications, vision complications, kidney impairment, sexual dysfunction Screening for signs and symptoms of depression related to chronic disease state  Assessed social determinant of health barriers  Patient Goals/Self-Care Activities: Take all medications as prescribed Attend all scheduled provider appointments Call pharmacy for medication refills 3-7 days in advance of running out of medications Attend church or other social activities Perform all self care activities independently  Perform IADL's (shopping, preparing meals, housekeeping, managing finances) independently Call provider office for new concerns or questions  schedule appointment with eye doctor check blood sugar at prescribed times: once daily and when you have symptoms of low or high blood sugar check feet daily for cuts, sores or redness take the blood sugar log to all doctor visits fill half of plate with vegetables keep a food diary manage portion size keep feet up while sitting wash and dry feet carefully every day wear comfortable, cotton socks check blood pressure daily write blood pressure results in a log or diary take blood pressure log to all doctor appointments call doctor for signs and symptoms of high blood pressure take all medications exactly as prescribed  Follow Up Plan:  Telephone follow up appointment with care management team member scheduled for:  10-01-2023 at 1:45 pm           Follow up plan:  Telephone follow up appointment with care management team member scheduled for:10-01-2023 at 1:45 pm  Mr. Shank was given information about Care  Management services today including:  Care Management services include personalized support from designated clinical staff supervised by a physician, including  individualized plan of care and coordination with other care providers 24/7 contact phone numbers for assistance for urgent and routine care needs. The patient may stop CCM services at any time (effective at the end of the month) by phone call to the office staff.  Patient agreed to services and verbal consent obtained.  Abron Hoist, BSN RN   [facility], [facility] RN Care Manager Direct Dial: [phone]  Fax: [phone]    Diabetes Mellitus and Nutrition, Adult When you have diabetes, or diabetes mellitus, it is very important to have healthy eating habits because your blood sugar (glucose) levels are greatly affected by what you eat and drink. Eating healthy foods in the right amounts, at about the same times every day, can help you: Manage your blood glucose. Lower your risk of heart disease. Improve your blood pressure. Reach or maintain a healthy weight. What can affect my meal plan? Every person with diabetes is different, and each person has different needs for a meal plan. Your health care provider may recommend that you work with a dietitian to make a meal plan that is best for you. Your meal plan may vary depending on factors such as: The calories you need. The medicines you take. Your weight. Your blood glucose, blood pressure, and cholesterol levels. Your activity level. Other health conditions you have, such as heart or kidney disease. How do carbohydrates affect me? Carbohydrates, also called carbs, affect your blood glucose level more than any other type of food. Eating carbs raises the amount of glucose in your blood. It is important to know how many carbs you can safely have in each meal. This is different for every person. Your dietitian can help you calculate how many  carbs you should have at each meal and for each snack. How does alcohol affect me? Alcohol can cause a decrease in blood glucose (hypoglycemia), especially if you use insulin  or take certain diabetes medicines by mouth. Hypoglycemia can be a life-threatening condition. Symptoms of hypoglycemia, such as sleepiness, dizziness, and confusion, are similar to symptoms of having too much alcohol. Do not drink alcohol if: Your health care provider tells you not to drink. You are pregnant, may be pregnant, or are planning to become pregnant. If you drink alcohol: Limit how much you have to: 0-1 drink a day for women. 0-2 drinks a day for men. Know how much alcohol is in your drink. In the [place] one drink equals one 12 oz bottle of beer (355 mL), one 5 oz glass of wine (148 mL), or one 1 oz glass of hard liquor (44 mL). Keep yourself hydrated with water , diet soda, or unsweetened iced tea. Keep in mind that regular soda, juice, and other mixers may contain a lot of sugar and must be counted as carbs. What are tips for following this plan?  Reading food labels Start by checking the serving size on the Nutrition Facts label of packaged foods and drinks. The number of calories and the amount of carbs, fats, and other nutrients listed on the label are based on one serving of the item. Many items contain more than one serving per package. Check the total grams (g) of carbs in one serving. Check the number of grams of saturated fats and trans fats in one serving. Choose foods that have a low amount or none of these fats. Check the number of milligrams (mg) of salt (sodium) in one serving. Most people should limit total sodium intake to less than  2,300 mg per day. Always check the nutrition information of foods labeled as "low-fat" or "nonfat." These foods may be higher in added sugar or refined carbs and should be avoided. Talk to your dietitian to identify your daily goals for nutrients listed on the  label. Shopping Avoid buying canned, pre-made, or processed foods. These foods tend to be high in fat, sodium, and added sugar. Shop around the outside edge of the grocery store. This is where you will most often find fresh fruits and vegetables, bulk grains, fresh meats, and fresh dairy products. Cooking Use low-heat cooking methods, such as baking, instead of high-heat cooking methods, such as deep frying. Cook using healthy oils, such as olive, canola, or sunflower oil. Avoid cooking with butter, cream, or high-fat meats. Meal planning Eat meals and snacks regularly, preferably at the same times every day. Avoid going long periods of time without eating. Eat foods that are high in fiber, such as fresh fruits, vegetables, beans, and whole grains. Eat 4-6 oz (112-168 g) of lean protein each day, such as lean meat, chicken, fish, eggs, or tofu. One ounce (oz) (28 g) of lean protein is equal to: 1 oz (28 g) of meat, chicken, or fish. 1 egg.  cup (62 g) of tofu. Eat some foods each day that contain healthy fats, such as avocado, nuts, seeds, and fish. What foods should I eat? Fruits Berries. Apples. Oranges. Peaches. Apricots. Plums. Grapes. Mangoes. Papayas. Pomegranates. Kiwi. Cherries. Vegetables Leafy greens, including lettuce, spinach, kale, chard, collard greens, mustard greens, and cabbage. Beets. Cauliflower. Broccoli. Carrots. Green beans. Tomatoes. Peppers. Onions. Cucumbers. Brussels sprouts. Grains Whole grains, such as whole-wheat or whole-grain bread, crackers, tortillas, cereal, and pasta. Unsweetened oatmeal. Quinoa. Brown or wild rice. Meats and other proteins Seafood. Poultry without skin. Lean cuts of poultry and beef. Tofu. Nuts. Seeds. Dairy Low-fat or fat-free dairy products such as milk, yogurt, and cheese. The items listed above may not be a complete list of foods and beverages you can eat and drink. Contact a dietitian for more information. What foods should I  avoid? Fruits Fruits canned with syrup. Vegetables Canned vegetables. Frozen vegetables with butter or cream sauce. Grains Refined white flour and flour products such as bread, pasta, snack foods, and cereals. Avoid all processed foods. Meats and other proteins Fatty cuts of meat. Poultry with skin. Breaded or fried meats. Processed meat. Avoid saturated fats. Dairy Full-fat yogurt, cheese, or milk. Beverages Sweetened drinks, such as soda or iced tea. The items listed above may not be a complete list of foods and beverages you should avoid. Contact a dietitian for more information. Questions to ask a health care provider Do I need to meet with a certified diabetes care and education specialist? Do I need to meet with a dietitian? What number can I call if I have questions? When are the best times to check my blood glucose? Where to find more information: American Diabetes Association: diabetes.org Academy of Nutrition and Dietetics: eatright.[employer] of Diabetes and Digestive and Kidney Diseases: StageSync.si Association of Diabetes Care & Education Specialists: diabeteseducator.org Summary It is important to have healthy eating habits because your blood sugar (glucose) levels are greatly affected by what you eat and drink. It is important to use alcohol carefully. A healthy meal plan will help you manage your blood glucose and lower your risk of heart disease. Your health care provider may recommend that you work with a dietitian to make a meal plan that is best for  you. This information is not intended to replace advice given to you by your health care provider. Make sure you discuss any questions you have with your health care provider. Document Revised: 02/03/2020 Document Reviewed: 02/04/2020 Elsevier Patient Education  2024 Elsevier Inc.  Carbohydrate Counting for Diabetes Mellitus, Adult Carbohydrate counting is a method of keeping track of how many  carbohydrates you eat. Eating carbohydrates increases the amount of sugar (glucose) in the blood. Counting how many carbohydrates you eat improves how well you manage your blood glucose. This, in turn, helps you manage your diabetes. Carbohydrates are measured in grams (g) per serving. It is important to know how many carbohydrates (in grams or by serving size) you can have in each meal. This is different for every person. A dietitian can help you make a meal plan and calculate how many carbohydrates you should have at each meal and snack. What foods contain carbohydrates? Carbohydrates are found in the following foods: Grains, such as breads and cereals. Dried beans and soy products. Starchy vegetables, such as potatoes, peas, and corn. Fruit and fruit juices. Milk and yogurt. Sweets and snack foods, such as cake, cookies, candy, chips, and soft drinks. How do I count carbohydrates in foods? There are two ways to count carbohydrates in food. You can read food labels or learn standard serving sizes of foods. You can use either of these methods or a combination of both. Using the Nutrition Facts label The Nutrition Facts list is included on the labels of almost all packaged foods and beverages in the [place] . It includes: The serving size. Information about nutrients in each serving, including the grams of carbohydrate per serving. To use the Nutrition Facts, decide how many servings you will have. Then, multiply the number of servings by the number of carbohydrates per serving. The resulting number is the total grams of carbohydrates that you will be having. Learning the standard serving sizes of foods When you eat carbohydrate foods that are not packaged or do not include Nutrition Facts on the label, you need to measure the servings in order to count the grams of carbohydrates. Measure the foods that you will eat with a food scale or measuring cup, if needed. Decide how many standard-size  servings you will eat. Multiply the number of servings by 15. For foods that contain carbohydrates, one serving equals 15 g of carbohydrates. For example, if you eat 2 cups or 10 oz (300 g) of strawberries, you will have eaten 2 servings and 30 g of carbohydrates (2 servings x 15 g = 30 g). For foods that have more than one food mixed, such as soups and casseroles, you must count the carbohydrates in each food that is included. The following list contains standard serving sizes of common carbohydrate-rich foods. Each of these servings has about 15 g of carbohydrates: 1 slice of bread. 1 six-inch (15 cm) tortilla. ? cup or 2 oz (53 g) cooked rice or pasta.  cup or 3 oz (85 g) cooked or canned, drained and rinsed beans or lentils.  cup or 3 oz (85 g) starchy vegetable, such as peas, corn, or squash.  cup or 4 oz (120 g) hot cereal.  cup or 3 oz (85 g) boiled or mashed potatoes, or  or 3 oz (85 g) of a large baked potato.  cup or 4 fl oz (118 mL) fruit juice. 1 cup or 8 fl oz (237 mL) milk. 1 small or 4 oz (106 g) apple.  or  2 oz (63 g) of a medium banana. 1 cup or 5 oz (150 g) strawberries. 3 cups or 1 oz (28.3 g) popped popcorn. What is an example of carbohydrate counting? To calculate the grams of carbohydrates in this sample meal, follow the steps shown below. Sample meal 3 oz (85 g) chicken breast. ? cup or 4 oz (106 g) brown rice.  cup or 3 oz (85 g) corn. 1 cup or 8 fl oz (237 mL) milk. 1 cup or 5 oz (150 g) strawberries with sugar-free whipped topping. Carbohydrate calculation Identify the foods that contain carbohydrates: Rice. Corn. Milk. Strawberries. Calculate how many servings you have of each food: 2 servings rice. 1 serving corn. 1 serving milk. 1 serving strawberries. Multiply each number of servings by 15 g: 2 servings rice x 15 g = 30 g. 1 serving corn x 15 g = 15 g. 1 serving milk x 15 g = 15 g. 1 serving strawberries x 15 g = 15 g. Add together all  of the amounts to find the total grams of carbohydrates eaten: 30 g + 15 g + 15 g + 15 g = 75 g of carbohydrates total. What are tips for following this plan? Shopping Develop a meal plan and then make a shopping list. Buy fresh and frozen vegetables, fresh and frozen fruit, dairy, eggs, beans, lentils, and whole grains. Look at food labels. Choose foods that have more fiber and less sugar. Avoid processed foods and foods with added sugars. Meal planning Aim to have the same number of grams of carbohydrates at each meal and for each snack time. Plan to have regular, balanced meals and snacks. Where to find more information American Diabetes Association: diabetes.org Centers for Disease Control and Prevention: TonerPromos.no Academy of Nutrition and Dietetics: eatright.org Association of Diabetes Care & Education Specialists: diabeteseducator.org Summary Carbohydrate counting is a method of keeping track of how many carbohydrates you eat. Eating carbohydrates increases the amount of sugar (glucose) in your blood. Counting how many carbohydrates you eat improves how well you manage your blood glucose. This helps you manage your diabetes. A dietitian can help you make a meal plan and calculate how many carbohydrates you should have at each meal and snack. This information is not intended to replace advice given to you by your health care provider. Make sure you discuss any questions you have with your health care provider. Document Revised: 02/03/2020 Document Reviewed: 02/04/2020 Elsevier Patient Education  2024 Elsevier Inc.  Diabetes Mellitus and Meal Planning Learn how to make healthy food choices whether eating out or at home. You'll also learn about food portion sizes, how to read food labels, and healthy ways to cook. To view the content, go to this web address: https://pe.elsevier.[email or web address]  This video will expire on: 06/27/2025. If you need access to this video following this date,  please reach out to the healthcare provider who assigned it to you. This information is not intended to replace advice given to you by your health care provider. Make sure you discuss any questions you have with your health care provider. Elsevier Patient Education  2024 [employer].

## 2023-08-27 NOTE — Telephone Encounter (Signed)
 Has infection and PCP called in one time dose RX, wants to know if Dr Claretta Croft can call in something else for after that.

## 2023-08-27 NOTE — Progress Notes (Signed)
 Pt was placed in wheelchair without bleeding.  Discharge instructions given to pt and wife and dsughter who verbalize understanding and deny further questions.  Discharged home with  wife and daughter who will drive and stay with pt x 24 hrs.

## 2023-08-27 NOTE — Telephone Encounter (Signed)
 Copied from CRM [id number]. Topic: Clinical - Prescription Issue >> Aug 27, 2023  4:05 PM Yolanda T wrote: Reason for CRM: April from [facility] called stated fosfomycin (MONUROL ) 3 g PACK is not covered by the patients insurance

## 2023-08-27 NOTE — Procedures (Signed)
 Vascular and Interventional Radiology Procedure Note  Patient: Jacob Fowler DOB: 1951/04/14 Medical Record Number: [id number] Note Date/Time: 08/27/23 12:56 PM   Performing Physician: Art Largo, MD Assistant(s): None  Diagnosis: Hx Pancreatic CA. HM liver masses on PET   Procedure: LIVER BIOPSY, TARGETED  Anesthesia: Local Anesthetic Complications: None Estimated Blood Loss: Minimal Specimens: Sent for Pathology  Findings:  Successful Ultrasound-guided biopsy of liver mass. A total of 2 samples were obtained. Hemostasis of the tract was achieved using Manual Pressure.  Plan: Bed rest for 2 hours.  See detailed procedure note with images in PACS. The patient tolerated the procedure well without incident or complication and was returned to Recovery in stable condition.    Art Largo, MD [facility] Radiology   Pager. [phone] Clinic. [phone]

## 2023-08-27 NOTE — Progress Notes (Addendum)
 Urine culture grew bacteria that is resistant to everythiing. Will prescribe fosfomycin and have him follow up with urologist  Meds ordered this encounter  Medications   DISCONTD: fosfomycin (MONUROL ) 3 g PACK    Sig: Take 3 g by mouth once for 1 dose.    Dispense:  3 g    Refill:  0    Supervising Provider:   Jolyne Needs A [[id number]]   fosfomycin (MONUROL ) 3 g PACK    Sig: Take 3 g by mouth once for 1 dose.    Dispense:  3 g    Refill:  0    Supervising Provider:   Jolyne Needs A [[id number]]   Mary-Margaret Gaylyn Keas, FNP

## 2023-08-28 LAB — SURGICAL PATHOLOGY

## 2023-08-28 NOTE — Telephone Encounter (Signed)
Name from pharmacy: FOSFOMYCIN 3 GM SACHET  Pharmacy comment: Alternative Requested:NOT COVERED PLEASE ADVISE

## 2023-08-29 ENCOUNTER — Other Ambulatory Visit: Payer: Self-pay | Admitting: *Deleted

## 2023-08-29 ENCOUNTER — Ambulatory Visit: Payer: PPO | Admitting: Neurology

## 2023-08-29 DIAGNOSIS — N390 Urinary tract infection, site not specified: Secondary | ICD-10-CM | POA: Diagnosis not present

## 2023-08-29 DIAGNOSIS — N401 Enlarged prostate with lower urinary tract symptoms: Secondary | ICD-10-CM | POA: Diagnosis not present

## 2023-08-29 DIAGNOSIS — K8689 Other specified diseases of pancreas: Secondary | ICD-10-CM

## 2023-08-29 NOTE — Progress Notes (Signed)
Signatera testing requested on accession number [id number]

## 2023-08-30 ENCOUNTER — Other Ambulatory Visit (HOSPITAL_COMMUNITY): Payer: Self-pay

## 2023-08-31 ENCOUNTER — Emergency Department (HOSPITAL_BASED_OUTPATIENT_CLINIC_OR_DEPARTMENT_OTHER): Payer: PPO

## 2023-08-31 ENCOUNTER — Encounter (HOSPITAL_BASED_OUTPATIENT_CLINIC_OR_DEPARTMENT_OTHER): Payer: Self-pay

## 2023-08-31 ENCOUNTER — Inpatient Hospital Stay (HOSPITAL_BASED_OUTPATIENT_CLINIC_OR_DEPARTMENT_OTHER)
Admission: EM | Admit: 2023-08-31 | Discharge: 2023-09-03 | DRG: 444 | Disposition: A | Discharge status: Hospice | Payer: PPO | Attending: Internal Medicine | Admitting: Internal Medicine

## 2023-08-31 DIAGNOSIS — L89313 Pressure ulcer of right buttock, stage 3: Secondary | ICD-10-CM | POA: Diagnosis present

## 2023-08-31 DIAGNOSIS — E871 Hypo-osmolality and hyponatremia: Secondary | ICD-10-CM

## 2023-08-31 DIAGNOSIS — K7689 Other specified diseases of liver: Secondary | ICD-10-CM | POA: Diagnosis not present

## 2023-08-31 DIAGNOSIS — G8250 Quadriplegia, unspecified: Secondary | ICD-10-CM | POA: Diagnosis not present

## 2023-08-31 DIAGNOSIS — R932 Abnormal findings on diagnostic imaging of liver and biliary tract: Secondary | ICD-10-CM | POA: Diagnosis not present

## 2023-08-31 DIAGNOSIS — R451 Restlessness and agitation: Secondary | ICD-10-CM

## 2023-08-31 DIAGNOSIS — G35 Multiple sclerosis: Secondary | ICD-10-CM | POA: Diagnosis present

## 2023-08-31 DIAGNOSIS — E1122 Type 2 diabetes mellitus with diabetic chronic kidney disease: Secondary | ICD-10-CM | POA: Diagnosis present

## 2023-08-31 DIAGNOSIS — E7800 Pure hypercholesterolemia, unspecified: Secondary | ICD-10-CM | POA: Diagnosis present

## 2023-08-31 DIAGNOSIS — F5109 Other insomnia not due to a substance or known physiological condition: Secondary | ICD-10-CM

## 2023-08-31 DIAGNOSIS — Z8249 Family history of ischemic heart disease and other diseases of the circulatory system: Secondary | ICD-10-CM

## 2023-08-31 DIAGNOSIS — Z87440 Personal history of urinary (tract) infections: Secondary | ICD-10-CM

## 2023-08-31 DIAGNOSIS — Z1152 Encounter for screening for COVID-19: Secondary | ICD-10-CM | POA: Diagnosis not present

## 2023-08-31 DIAGNOSIS — R109 Unspecified abdominal pain: Secondary | ICD-10-CM | POA: Diagnosis present

## 2023-08-31 DIAGNOSIS — Z79890 Hormone replacement therapy (postmenopausal): Secondary | ICD-10-CM

## 2023-08-31 DIAGNOSIS — F39 Unspecified mood [affective] disorder: Secondary | ICD-10-CM | POA: Diagnosis present

## 2023-08-31 DIAGNOSIS — N401 Enlarged prostate with lower urinary tract symptoms: Secondary | ICD-10-CM | POA: Diagnosis present

## 2023-08-31 DIAGNOSIS — N179 Acute kidney failure, unspecified: Secondary | ICD-10-CM

## 2023-08-31 DIAGNOSIS — R17 Unspecified jaundice: Secondary | ICD-10-CM | POA: Diagnosis not present

## 2023-08-31 DIAGNOSIS — K831 Obstruction of bile duct: Principal | ICD-10-CM | POA: Diagnosis present

## 2023-08-31 DIAGNOSIS — R52 Pain, unspecified: Secondary | ICD-10-CM

## 2023-08-31 DIAGNOSIS — D509 Iron deficiency anemia, unspecified: Secondary | ICD-10-CM | POA: Diagnosis present

## 2023-08-31 DIAGNOSIS — I959 Hypotension, unspecified: Secondary | ICD-10-CM | POA: Diagnosis present

## 2023-08-31 DIAGNOSIS — E039 Hypothyroidism, unspecified: Secondary | ICD-10-CM | POA: Diagnosis present

## 2023-08-31 DIAGNOSIS — K449 Diaphragmatic hernia without obstruction or gangrene: Secondary | ICD-10-CM | POA: Diagnosis not present

## 2023-08-31 DIAGNOSIS — Z7982 Long term (current) use of aspirin: Secondary | ICD-10-CM

## 2023-08-31 DIAGNOSIS — K219 Gastro-esophageal reflux disease without esophagitis: Secondary | ICD-10-CM | POA: Diagnosis present

## 2023-08-31 DIAGNOSIS — Z7189 Other specified counseling: Secondary | ICD-10-CM | POA: Diagnosis not present

## 2023-08-31 DIAGNOSIS — E1165 Type 2 diabetes mellitus with hyperglycemia: Secondary | ICD-10-CM | POA: Diagnosis present

## 2023-08-31 DIAGNOSIS — Z7985 Long-term (current) use of injectable non-insulin antidiabetic drugs: Secondary | ICD-10-CM

## 2023-08-31 DIAGNOSIS — Z8051 Family history of malignant neoplasm of kidney: Secondary | ICD-10-CM

## 2023-08-31 DIAGNOSIS — Z8052 Family history of malignant neoplasm of bladder: Secondary | ICD-10-CM

## 2023-08-31 DIAGNOSIS — Z87891 Personal history of nicotine dependence: Secondary | ICD-10-CM

## 2023-08-31 DIAGNOSIS — G8929 Other chronic pain: Secondary | ICD-10-CM | POA: Diagnosis present

## 2023-08-31 DIAGNOSIS — Z7984 Long term (current) use of oral hypoglycemic drugs: Secondary | ICD-10-CM

## 2023-08-31 DIAGNOSIS — Z860101 Personal history of adenomatous and serrated colon polyps: Secondary | ICD-10-CM

## 2023-08-31 DIAGNOSIS — I4510 Unspecified right bundle-branch block: Secondary | ICD-10-CM | POA: Diagnosis present

## 2023-08-31 DIAGNOSIS — D631 Anemia in chronic kidney disease: Secondary | ICD-10-CM | POA: Diagnosis present

## 2023-08-31 DIAGNOSIS — R4589 Other symptoms and signs involving emotional state: Secondary | ICD-10-CM | POA: Diagnosis not present

## 2023-08-31 DIAGNOSIS — L89322 Pressure ulcer of left buttock, stage 2: Secondary | ICD-10-CM | POA: Diagnosis not present

## 2023-08-31 DIAGNOSIS — G9341 Metabolic encephalopathy: Secondary | ICD-10-CM | POA: Diagnosis present

## 2023-08-31 DIAGNOSIS — Z66 Do not resuscitate: Secondary | ICD-10-CM | POA: Diagnosis not present

## 2023-08-31 DIAGNOSIS — L98429 Non-pressure chronic ulcer of back with unspecified severity: Secondary | ICD-10-CM | POA: Diagnosis present

## 2023-08-31 DIAGNOSIS — Z794 Long term (current) use of insulin: Secondary | ICD-10-CM | POA: Diagnosis not present

## 2023-08-31 DIAGNOSIS — E872 Acidosis, unspecified: Secondary | ICD-10-CM | POA: Diagnosis present

## 2023-08-31 DIAGNOSIS — R161 Splenomegaly, not elsewhere classified: Secondary | ICD-10-CM | POA: Diagnosis not present

## 2023-08-31 DIAGNOSIS — C259 Malignant neoplasm of pancreas, unspecified: Secondary | ICD-10-CM | POA: Diagnosis not present

## 2023-08-31 DIAGNOSIS — Z79899 Other long term (current) drug therapy: Secondary | ICD-10-CM

## 2023-08-31 DIAGNOSIS — C787 Secondary malignant neoplasm of liver and intrahepatic bile duct: Secondary | ICD-10-CM | POA: Diagnosis not present

## 2023-08-31 DIAGNOSIS — Z515 Encounter for palliative care: Secondary | ICD-10-CM | POA: Diagnosis not present

## 2023-08-31 DIAGNOSIS — G259 Extrapyramidal and movement disorder, unspecified: Secondary | ICD-10-CM | POA: Diagnosis not present

## 2023-08-31 DIAGNOSIS — F419 Anxiety disorder, unspecified: Secondary | ICD-10-CM | POA: Diagnosis present

## 2023-08-31 DIAGNOSIS — I129 Hypertensive chronic kidney disease with stage 1 through stage 4 chronic kidney disease, or unspecified chronic kidney disease: Secondary | ICD-10-CM | POA: Diagnosis not present

## 2023-08-31 DIAGNOSIS — E861 Hypovolemia: Secondary | ICD-10-CM | POA: Diagnosis present

## 2023-08-31 DIAGNOSIS — G4700 Insomnia, unspecified: Secondary | ICD-10-CM | POA: Diagnosis not present

## 2023-08-31 DIAGNOSIS — I10 Essential (primary) hypertension: Secondary | ICD-10-CM | POA: Diagnosis not present

## 2023-08-31 DIAGNOSIS — L8990 Pressure ulcer of unspecified site, unspecified stage: Secondary | ICD-10-CM | POA: Insufficient documentation

## 2023-08-31 DIAGNOSIS — Z87442 Personal history of urinary calculi: Secondary | ICD-10-CM

## 2023-08-31 DIAGNOSIS — Z833 Family history of diabetes mellitus: Secondary | ICD-10-CM

## 2023-08-31 DIAGNOSIS — N1832 Chronic kidney disease, stage 3b: Secondary | ICD-10-CM | POA: Diagnosis not present

## 2023-08-31 DIAGNOSIS — E860 Dehydration: Secondary | ICD-10-CM | POA: Diagnosis present

## 2023-08-31 DIAGNOSIS — Z823 Family history of stroke: Secondary | ICD-10-CM

## 2023-08-31 DIAGNOSIS — I2510 Atherosclerotic heart disease of native coronary artery without angina pectoris: Secondary | ICD-10-CM | POA: Diagnosis present

## 2023-08-31 LAB — BASIC METABOLIC PANEL
Anion gap: 13 (ref 5–15)
BUN: 41 mg/dL — ABNORMAL HIGH (ref 8–23)
CO2: 17 mmol/L — ABNORMAL LOW (ref 22–32)
Calcium: 9.2 mg/dL (ref 8.9–10.3)
Chloride: 90 mmol/L — ABNORMAL LOW (ref 98–111)
Creatinine, Ser: 2.18 mg/dL — ABNORMAL HIGH (ref 0.61–1.24)
GFR, Estimated: 31 mL/min — ABNORMAL LOW (ref >60)
Glucose, Bld: 227 mg/dL — ABNORMAL HIGH (ref 70–99)
Potassium: 4.5 mmol/L (ref 3.5–5.1)
Sodium: 120 mmol/L — ABNORMAL LOW (ref 135–145)

## 2023-08-31 LAB — HEPATIC FUNCTION PANEL
ALT: 348 U/L — ABNORMAL HIGH (ref 0–44)
AST: 175 U/L — ABNORMAL HIGH (ref 15–41)
Albumin: 3.8 g/dL (ref 3.5–5.0)
Alkaline Phosphatase: 1207 U/L — ABNORMAL HIGH (ref 38–126)
Bilirubin, Direct: 6.2 mg/dL — ABNORMAL HIGH (ref 0.0–0.2)
Indirect Bilirubin: 4 mg/dL — ABNORMAL HIGH (ref 0.3–0.9)
Total Bilirubin: 10.2 mg/dL — ABNORMAL HIGH (ref 0.0–1.2)
Total Protein: 7.3 g/dL (ref 6.5–8.1)

## 2023-08-31 LAB — CBC
HCT: 31.5 % — ABNORMAL LOW (ref 39.0–52.0)
Hemoglobin: 10.8 g/dL — ABNORMAL LOW (ref 13.0–17.0)
MCH: 26.2 pg (ref 26.0–34.0)
MCHC: 34.3 g/dL (ref 30.0–36.0)
MCV: 76.5 fL — ABNORMAL LOW (ref 80.0–100.0)
Platelets: 168 10*3/uL (ref 150–400)
RBC: 4.12 MIL/uL — ABNORMAL LOW (ref 4.22–5.81)
RDW: 17.9 % — ABNORMAL HIGH (ref 11.5–15.5)
WBC: 11.5 10*3/uL — ABNORMAL HIGH (ref 4.0–10.5)
nRBC: 0 % (ref 0.0–0.2)

## 2023-08-31 LAB — AMMONIA: Ammonia: 57 umol/L — ABNORMAL HIGH (ref 9–35)

## 2023-08-31 LAB — PROTIME-INR
INR: 1.1 (ref 0.8–1.2)
Prothrombin Time: 14.2 s (ref 11.4–15.2)

## 2023-08-31 LAB — LACTIC ACID, PLASMA: Lactic Acid, Venous: 1.3 mmol/L (ref 0.5–1.9)

## 2023-08-31 MED ORDER — SODIUM CHLORIDE 0.9% FLUSH
3.0000 mL | Freq: Two times a day (BID) | INTRAVENOUS | Status: DC
  Administered 2023-08-31 – 2023-09-02 (×4): 3 mL via INTRAVENOUS

## 2023-08-31 MED ORDER — SODIUM CHLORIDE 0.9 % IV BOLUS
1000.0000 mL | Freq: Once | INTRAVENOUS | Status: AC
  Administered 2023-08-31: 1000 mL via INTRAVENOUS

## 2023-08-31 MED ORDER — IOHEXOL 300 MG/ML  SOLN
75.0000 mL | Freq: Once | INTRAMUSCULAR | Status: AC | PRN
  Administered 2023-08-31: 75 mL via INTRAVENOUS

## 2023-08-31 MED ORDER — OXYCODONE HCL 5 MG PO TABS: 5.0000 mg | ORAL_TABLET | Freq: Four times a day (QID) | ORAL | Status: DC | PRN

## 2023-08-31 MED ORDER — OXYCODONE HCL 5 MG PO TABS
2.5000 mg | ORAL_TABLET | Freq: Four times a day (QID) | ORAL | Status: DC | PRN
  Administered 2023-08-31: 2.5 mg via ORAL
  Filled 2023-08-31: qty 1

## 2023-08-31 MED ORDER — POLYETHYLENE GLYCOL 3350 17 G PO PACK: 17.0000 g | PACK | Freq: Every day | ORAL | Status: DC | PRN

## 2023-08-31 MED ORDER — ONDANSETRON HCL 4 MG/2ML IJ SOLN: 4.0000 mg | Freq: Four times a day (QID) | INTRAMUSCULAR | Status: DC | PRN

## 2023-08-31 MED ORDER — HYDROMORPHONE HCL 1 MG/ML IJ SOLN: 0.5000 mg | INTRAMUSCULAR | Status: DC | PRN

## 2023-08-31 MED ORDER — MELATONIN 3 MG PO TABS: 6.0000 mg | ORAL_TABLET | Freq: Every evening | ORAL | Status: DC | PRN

## 2023-08-31 MED ORDER — LACTATED RINGERS IV BOLUS
1000.0000 mL | Freq: Once | INTRAVENOUS | Status: AC
  Administered 2023-08-31: 1000 mL via INTRAVENOUS

## 2023-08-31 NOTE — ED Triage Notes (Signed)
Sent by Nurse for jaundice.  Present in WC looking weak with low BP in triage.  On Fosfomycin for UTI

## 2023-08-31 NOTE — ED Provider Notes (Signed)
[place] EMERGENCY DEPARTMENT AT [facility] Provider Note   CSN: [id number] Arrival date & time: 08/31/23  1358     History Chief Complaint  Patient presents with   Jaudice   Hypotension    HPI Jacob Fowler is a 73 y.o. male presenting for chief complaint of jaundice. Looked ok on Monday. Nurse came out to check on him after a recent admission for UTI. Found to have pancreatic cancer in that admission. Patient's recorded medical, surgical, social, medication list and allergies were reviewed in the Snapshot window as part of the initial history.   Review of Systems   Review of Systems  Constitutional:  Negative for chills and fever.  HENT:  Negative for ear pain and sore throat.   Eyes:  Negative for pain and visual disturbance.  Respiratory:  Negative for cough and shortness of breath.   Cardiovascular:  Negative for chest pain and palpitations.  Gastrointestinal:  Negative for abdominal pain and vomiting.  Genitourinary:  Negative for dysuria and hematuria.  Musculoskeletal:  Negative for arthralgias and back pain.  Skin:  Negative for color change and rash.  Neurological:  Negative for seizures and syncope.  All other systems reviewed and are negative.   Physical Exam Updated Vital Signs BP (!) 107/52 (BP Location: Right Arm)   Pulse 87   Temp 98.5 F (36.9 C) (Oral)   Resp 20   Ht 5\' 6"  (1.676 m)   Wt 68 kg   SpO2 98%   BMI 24.20 kg/m  Physical Exam Vitals and nursing note reviewed.  Constitutional:      General: He is not in acute distress.    Appearance: He is well-developed.  HENT:     Head: Normocephalic and atraumatic.  Eyes:     Conjunctiva/sclera: Conjunctivae normal.  Cardiovascular:     Rate and Rhythm: Normal rate and regular rhythm.  Pulmonary:     Effort: Pulmonary effort is normal. No respiratory distress.  Abdominal:     General: Abdomen is flat. There is no distension.  Musculoskeletal:        General: No  swelling or deformity.  Skin:    General: Skin is warm and dry.     Capillary Refill: Capillary refill takes less than 2 seconds.  Neurological:     Mental Status: He is alert and oriented to person, place, and time. Mental status is at baseline.      ED Course/ Medical Decision Making/ A&P Clinical Course as of 08/31/23 2243  Fri Aug 31, 2023  1807 MELD Na 30 [CC]    Clinical Course User Index [CC] Glyn Ade, MD    Procedures Procedures   Medications Ordered in ED Medications  lactated ringers bolus 1,000 mL (0 mLs Intravenous Stopped 08/31/23 1819)  iohexol (OMNIPAQUE) 300 MG/ML solution 75 mL (75 mLs Intravenous Contrast Given 08/31/23 1701)    Medical Decision Making:   Patient's history of present illness physicals and findings are most consistent with acute liver injury.  Uncertain etiology.  Considering drug-induced liver injury secondary to fosfomycin as a possibility though this is still nonspecific. Additionally may be structural or obstructive.  [facility] gastroenterology Dr. Bosie Clos recommended CT abdomen pelvis in addition to the current workup and admission to medicine with the goal consultation on the inpatient setting. Patient initially hypotensive on arrival treated with IV fluids and improved and stabilized.  Disposition:   Based on the above findings, I believe this patient is stable for admission.  Patient/family educated about specific findings on our evaluation and explained exact reasons for admission.  Patient/family educated about clinical situation and time was allowed to answer questions.   Admission team communicated with and agreed with need for admission. Patient admitted. Patient ready to move at this time.     Emergency Department Medication Summary:   Medications  lactated ringers bolus 1,000 mL (0 mLs Intravenous Stopped 08/31/23 1819)  iohexol (OMNIPAQUE) 300 MG/ML solution 75 mL (75 mLs Intravenous Contrast Given 08/31/23  1701)         Clinical Impression: No diagnosis found.   Admit   Final Clinical Impression(s) / ED Diagnoses Final diagnoses:  None    Rx / DC Orders ED Discharge Orders     None         Glyn Ade, MD 08/31/23 2306

## 2023-08-31 NOTE — ED Notes (Signed)
Per Provider holding off on urine collection from foley cath.

## 2023-08-31 NOTE — Progress Notes (Signed)
Plan of Care Note for accepted transfer   Patient: Jacob Fowler MRN: [id number]   DOA: 08/31/2023  Facility requesting transfer: [facility]. Requesting Provider: Dr. Doran Durand. Reason for transfer: Jaundice.  Pancreatic mass. Facility course: 73 y.o. male with medical history significant for multiple sclerosis, BPH, diabetes mellitus, hypertension, hypothyroidism, chronic indwelling Foley catheter recently diagnosed with pancreatic mass biopsies of the liver mets confirms adenocarcinoma was brought to the ER after family noticed patient becoming jaundiced also was recently treated for UTI with fosfomycin.  Labs show significantly elevated bilirubin with total bilirubin of 10.2 direct was 6.2.  CT abdomen pelvis shows the pancreatic mass and mets to the liver.  Blood pressure was in the low normal sodium was 120 creatinine slightly worsened.  Was given fluid bolus following which blood pressure has improved no signs of any cholangitis or sepsis per ER physician.  ER physician discussed with Dr. Myrtie Cruise gastroenterologist will be seeing patient in consult.  Patient is being admitted for jaundice which could be obstructive jaundice given the pancreatic mass.  Did discuss with the ER physician that if any further episodes of hypotension may need to consider higher level of care.  For now patient accepted to progressive care at [facility]  Plan of care: The patient is accepted for admission to Progressive unit, at [facility]   Author: Eduard Clos, MD 08/31/2023  Check [email or web address] for on-call coverage.  Nursing staff, Please call [facility] Admits & Consults System-Wide number on Amion as soon as patient's arrival, so appropriate admitting provider can evaluate the pt.

## 2023-09-01 DIAGNOSIS — Z7189 Other specified counseling: Secondary | ICD-10-CM

## 2023-09-01 DIAGNOSIS — R17 Unspecified jaundice: Secondary | ICD-10-CM

## 2023-09-01 DIAGNOSIS — N179 Acute kidney failure, unspecified: Secondary | ICD-10-CM | POA: Diagnosis not present

## 2023-09-01 DIAGNOSIS — L8990 Pressure ulcer of unspecified site, unspecified stage: Secondary | ICD-10-CM | POA: Insufficient documentation

## 2023-09-01 DIAGNOSIS — Z66 Do not resuscitate: Secondary | ICD-10-CM

## 2023-09-01 DIAGNOSIS — C259 Malignant neoplasm of pancreas, unspecified: Secondary | ICD-10-CM | POA: Diagnosis not present

## 2023-09-01 DIAGNOSIS — R4589 Other symptoms and signs involving emotional state: Secondary | ICD-10-CM

## 2023-09-01 DIAGNOSIS — Z515 Encounter for palliative care: Secondary | ICD-10-CM | POA: Diagnosis not present

## 2023-09-01 DIAGNOSIS — R451 Restlessness and agitation: Secondary | ICD-10-CM

## 2023-09-01 DIAGNOSIS — C787 Secondary malignant neoplasm of liver and intrahepatic bile duct: Secondary | ICD-10-CM

## 2023-09-01 DIAGNOSIS — K7689 Other specified diseases of liver: Secondary | ICD-10-CM | POA: Insufficient documentation

## 2023-09-01 LAB — CBC
HCT: 32.9 % — ABNORMAL LOW (ref 39.0–52.0)
Hemoglobin: 10.5 g/dL — ABNORMAL LOW (ref 13.0–17.0)
MCH: 25.6 pg — ABNORMAL LOW (ref 26.0–34.0)
MCHC: 31.9 g/dL (ref 30.0–36.0)
MCV: 80.2 fL (ref 80.0–100.0)
Platelets: 159 10*3/uL (ref 150–400)
RBC: 4.1 MIL/uL — ABNORMAL LOW (ref 4.22–5.81)
RDW: 18.4 % — ABNORMAL HIGH (ref 11.5–15.5)
WBC: 10.1 10*3/uL (ref 4.0–10.5)
nRBC: 0 % (ref 0.0–0.2)

## 2023-09-01 LAB — GLUCOSE, CAPILLARY
Glucose-Capillary: 124 mg/dL — ABNORMAL HIGH (ref 70–99)
Glucose-Capillary: 141 mg/dL — ABNORMAL HIGH (ref 70–99)
Glucose-Capillary: 182 mg/dL — ABNORMAL HIGH (ref 70–99)
Glucose-Capillary: 219 mg/dL — ABNORMAL HIGH (ref 70–99)
Glucose-Capillary: 300 mg/dL — ABNORMAL HIGH (ref 70–99)
Glucose-Capillary: 61 mg/dL — ABNORMAL LOW (ref 70–99)
Glucose-Capillary: 62 mg/dL — ABNORMAL LOW (ref 70–99)
Glucose-Capillary: 65 mg/dL — ABNORMAL LOW (ref 70–99)
Glucose-Capillary: 70 mg/dL (ref 70–99)
Glucose-Capillary: 91 mg/dL (ref 70–99)

## 2023-09-01 LAB — URINALYSIS, ROUTINE W REFLEX MICROSCOPIC
Bacteria, UA: NONE SEEN
Bilirubin Urine: NEGATIVE
Glucose, UA: NEGATIVE mg/dL
Ketones, ur: NEGATIVE mg/dL
Nitrite: NEGATIVE
Protein, ur: 30 mg/dL — AB
Specific Gravity, Urine: 1.018 (ref 1.005–1.030)
WBC, UA: >50 WBC/hpf (ref 0–5)
pH: 5 (ref 5.0–8.0)

## 2023-09-01 LAB — BASIC METABOLIC PANEL
Anion gap: 11 (ref 5–15)
BUN: 35 mg/dL — ABNORMAL HIGH (ref 8–23)
CO2: 18 mmol/L — ABNORMAL LOW (ref 22–32)
Calcium: 9.1 mg/dL (ref 8.9–10.3)
Chloride: 100 mmol/L (ref 98–111)
Creatinine, Ser: 1.73 mg/dL — ABNORMAL HIGH (ref 0.61–1.24)
GFR, Estimated: 41 mL/min — ABNORMAL LOW (ref >60)
Glucose, Bld: 67 mg/dL — ABNORMAL LOW (ref 70–99)
Potassium: 4.3 mmol/L (ref 3.5–5.1)
Sodium: 129 mmol/L — ABNORMAL LOW (ref 135–145)

## 2023-09-01 LAB — HEPATIC FUNCTION PANEL
ALT: 292 U/L — ABNORMAL HIGH (ref 0–44)
AST: 148 U/L — ABNORMAL HIGH (ref 15–41)
Albumin: 2.8 g/dL — ABNORMAL LOW (ref 3.5–5.0)
Alkaline Phosphatase: 1066 U/L — ABNORMAL HIGH (ref 38–126)
Bilirubin, Direct: 6.7 mg/dL — ABNORMAL HIGH (ref 0.0–0.2)
Indirect Bilirubin: 3.5 mg/dL — ABNORMAL HIGH (ref 0.3–0.9)
Total Bilirubin: 10.2 mg/dL — ABNORMAL HIGH (ref 0.0–1.2)
Total Protein: 6.6 g/dL (ref 6.5–8.1)

## 2023-09-01 LAB — OSMOLALITY: Osmolality: 286 mosm/kg (ref 275–295)

## 2023-09-01 LAB — HEMOGLOBIN A1C
Hgb A1c MFr Bld: 9.4 % — ABNORMAL HIGH (ref 4.8–5.6)
Mean Plasma Glucose: 223.08 mg/dL

## 2023-09-01 LAB — AMMONIA: Ammonia: 28 umol/L (ref 9–35)

## 2023-09-01 LAB — PHOSPHORUS: Phosphorus: 3.2 mg/dL (ref 2.5–4.6)

## 2023-09-01 LAB — MAGNESIUM: Magnesium: 2.2 mg/dL (ref 1.7–2.4)

## 2023-09-01 LAB — SODIUM, URINE, RANDOM: Sodium, Ur: 49 mmol/L

## 2023-09-01 LAB — OSMOLALITY, URINE: Osmolality, Ur: 312 mosm/kg (ref 300–900)

## 2023-09-01 MED ORDER — DEXTROSE 50 % IV SOLN
25.0000 mL | Freq: Once | INTRAVENOUS | Status: AC
  Administered 2023-09-01: 25 mL via INTRAVENOUS

## 2023-09-01 MED ORDER — CYANOCOBALAMIN 1000 MCG/ML IJ SOLN
1000.0000 ug | Freq: Once | INTRAMUSCULAR | Status: AC
  Administered 2023-09-01: 1000 ug via INTRAMUSCULAR
  Filled 2023-09-01: qty 1

## 2023-09-01 MED ORDER — CHLORHEXIDINE GLUCONATE CLOTH 2 % EX PADS
6.0000 | MEDICATED_PAD | Freq: Every day | CUTANEOUS | Status: DC
  Administered 2023-09-01 – 2023-09-02 (×2): 6 via TOPICAL

## 2023-09-01 MED ORDER — SODIUM CHLORIDE 0.9 % IV SOLN
2.0000 g | INTRAVENOUS | Status: DC
  Administered 2023-09-01 – 2023-09-03 (×3): 2 g via INTRAVENOUS
  Filled 2023-09-01 (×3): qty 20

## 2023-09-01 MED ORDER — LEVOTHYROXINE SODIUM 88 MCG PO TABS
88.0000 ug | ORAL_TABLET | Freq: Every day | ORAL | Status: DC
  Administered 2023-09-01 – 2023-09-03 (×2): 88 ug via ORAL
  Filled 2023-09-01 (×3): qty 1

## 2023-09-01 MED ORDER — PIPERACILLIN-TAZOBACTAM 3.375 G IVPB: 3.3750 g | Freq: Three times a day (TID) | INTRAVENOUS | Status: DC

## 2023-09-01 MED ORDER — INSULIN ASPART 100 UNIT/ML IJ SOLN
0.0000 [IU] | Freq: Every day | INTRAMUSCULAR | Status: DC
  Administered 2023-09-01: 3 [IU] via SUBCUTANEOUS

## 2023-09-01 MED ORDER — PANTOPRAZOLE SODIUM 40 MG PO TBEC
40.0000 mg | DELAYED_RELEASE_TABLET | Freq: Every day | ORAL | Status: DC
  Administered 2023-09-01 – 2023-09-03 (×3): 40 mg via ORAL
  Filled 2023-09-01 (×3): qty 1

## 2023-09-01 MED ORDER — INSULIN GLARGINE-YFGN 100 UNIT/ML ~~LOC~~ SOLN
50.0000 [IU] | Freq: Every day | SUBCUTANEOUS | Status: DC
  Administered 2023-09-01: 50 [IU] via SUBCUTANEOUS
  Filled 2023-09-01: qty 0.5

## 2023-09-01 MED ORDER — METRONIDAZOLE 500 MG/100ML IV SOLN
500.0000 mg | Freq: Two times a day (BID) | INTRAVENOUS | Status: DC
  Administered 2023-09-01 – 2023-09-03 (×5): 500 mg via INTRAVENOUS
  Filled 2023-09-01 (×5): qty 100

## 2023-09-01 MED ORDER — DEXTROSE 50 % IV SOLN
INTRAVENOUS | Status: AC
  Filled 2023-09-01: qty 50

## 2023-09-01 MED ORDER — LORAZEPAM 2 MG/ML PO CONC: 0.2500 mg | ORAL | Status: DC | PRN

## 2023-09-01 MED ORDER — DEXAMETHASONE SODIUM PHOSPHATE 4 MG/ML IJ SOLN
4.0000 mg | Freq: Every day | INTRAMUSCULAR | Status: DC
  Administered 2023-09-01 – 2023-09-03 (×3): 4 mg via INTRAVENOUS
  Filled 2023-09-01 (×3): qty 1

## 2023-09-01 MED ORDER — ALPRAZOLAM 0.5 MG PO TABS: 0.5000 mg | ORAL_TABLET | Freq: Every evening | ORAL | Status: DC | PRN

## 2023-09-01 MED ORDER — DEXTROSE 50 % IV SOLN
12.5000 g | INTRAVENOUS | Status: AC
  Administered 2023-09-01: 12.5 g via INTRAVENOUS

## 2023-09-01 MED ORDER — FERROUS SULFATE 325 (65 FE) MG PO TABS
325.0000 mg | ORAL_TABLET | Freq: Every day | ORAL | Status: DC
  Administered 2023-09-01 – 2023-09-03 (×3): 325 mg via ORAL
  Filled 2023-09-01 (×3): qty 1

## 2023-09-01 MED ORDER — DEXTROSE-SODIUM CHLORIDE 5-0.9 % IV SOLN: INTRAVENOUS | Status: AC

## 2023-09-01 MED ORDER — FINASTERIDE 5 MG PO TABS
5.0000 mg | ORAL_TABLET | Freq: Every day | ORAL | Status: DC
  Administered 2023-09-01 – 2023-09-03 (×3): 5 mg via ORAL
  Filled 2023-09-01 (×3): qty 1

## 2023-09-01 MED ORDER — ZINC OXIDE 40 % EX OINT
TOPICAL_OINTMENT | Freq: Every day | CUTANEOUS | Status: DC
  Filled 2023-09-01: qty 57

## 2023-09-01 MED ORDER — INSULIN ASPART 100 UNIT/ML IJ SOLN
0.0000 [IU] | Freq: Three times a day (TID) | INTRAMUSCULAR | Status: DC
  Administered 2023-09-01: 1 [IU] via SUBCUTANEOUS
  Administered 2023-09-01: 3 [IU] via SUBCUTANEOUS
  Administered 2023-09-02: 5 [IU] via SUBCUTANEOUS

## 2023-09-01 NOTE — Progress Notes (Signed)
OT Cancellation Note  Patient Details Name: Jacob Fowler MRN: [id number] DOB: [birth year]   Cancelled Treatment:    Reason Eval/Treat Not Completed: OT screened, no needs identified, will sign off Patient is TD at baseline with palliative care ordered at this time. OT to sign off.  Rosalio Loud, MS Acute Rehabilitation Department Office# [phone]  09/01/2023, 10:47 AM

## 2023-09-01 NOTE — Consult Note (Signed)
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  Since admission, patient has been receiving management for acute liver injury of predominantly cholestatic pattern, hypotension and hypovolemia, AKI, hyponatremia, and encephalopathy.  GI and IR consulted regarding recommendations.  Palliative medicine team consulted to assist with complex medical decision making.  Extensive review of EMR prior to presenting to bedside.  IR evaluated patient and did not believe there was any biliary dilatation to allow for drain placement-so not amenable to IR intervention at this time.  GI I discussed care with patient's family though they did not want to pursue MRCP imaging at this time.  No signs of biliary obstruction on CT imaging.  Presented to bedside to meet with patient.  Patient sleeping very soundly in bed and not easily awakened.  Patient's wife and daughter present at bedside.  Introduced myself and the role of the palliative medicine team in patient's medical journey.  Spent time learning about patient's medical illness and continued deterioration.  Family able to discuss they have heard patient's overall poor prognosis and that they do not want to put him through aggressive medical interventions knowing that he is going to succumb to this metastatic cancer.  Noted that patient would want to spend time at home and be able to pass  away there.  Spent time providing emotional support via active listening.  We discussed possibility of getting patient home with hospice support.  Family notes that patient's father had hospice of [place] when he died a few years ago and they would like the same hospice to assist with patient's care at this time.  Spent time discussing generalities regarding hospice and the care they would and would not provide at home.  Family agreeing with TOC involvement to assist with getting patient home with hospice.  Discussed care at this time.  Family would like to discontinue aggressive medical interventions such as lab work and imaging.  Family would like to continue antibiotics and fluid support at this time to hopefully allow patient time to get home and enjoy some time there before dying comfortably.  Their priority is patient's comfort at this point.  Discussed can continue current medical interventions without escalation including continuing lab work or GI interventions.  Family agreeing with this plan.  Also able to discuss CODE STATUS with family.  Wife and daughter acknowledged that patient would not want to be on life support.  Patient has documentation stating this.  Discussed will appropriately change CODE STATUS to DNR/DNI since chest compressions and intubation with mechanical ventilation would not lead to comfort focused care at the end of life.  Family agreeing with this change of CODE STATUS to DNR/DNI.  Spent time answering questions as able.  Thanked family for allow me to speak with them today.  Updated IDT including hospitalist, GI, IR, RN, and TOC regarding continuing appropriate medical interventions such as antibiotics while also not pursuing further imaging or lab work.  Adding medications for symptom management.  Working to get patient home with hospice support.  Primary Diagnoses  Present on Admission:  Jaundice  AKI (acute kidney injury) (HCC)  Hyponatremia    Past Medical  History:  Diagnosis Date   Acquired leg length discrepancy    Bladder stone    BPH associated with nocturia    CAD (coronary artery disease) CARDIOLOGIST- DR HOCHREIN--  LAST VISIT 09-27-2011 IN EPIC   Non obstructive Cath 2010-  HX CORONARY SPASM 2004   Cataract    bilateral    GERD (gastroesophageal reflux disease)    H/O hiatal hernia    Heart murmur    Heartburn    High cholesterol    History of nephrolithiasis 2009   Hx of adenomatous colonic polyps    Hyperlipidemia    Hypertension    Movement disorder    Multiple sclerosis (HCC) DX  10/10---  NEUROLOGIST  DR Gerlene Burdock [facility] ([place])   RIGHT SIDE AFFECTED MORE W/ WEAKNESS- - USES CANE   Nephrolithiasis 08/30/2020   Neuromuscular disorder (HCC)    MS   NIDDM (non-insulin dependent diabetes mellitus)    Peyronie's disease    RBBB    Renal stone RIGHT   Rosacea    Vision abnormalities    Social History   Socioeconomic History   Marital status: Married    Spouse name: Steward Drone   Number of children: 2   Years of education: 13   Highest education level: Associate degree: occupational, Scientist, product/process development, or vocational program  Occupational History   Occupation: retired    Comment: self -employed farmer  Tobacco Use   Smoking status: Former    Current packs/day: 0.00    Average packs/day: 1 pack/day for 7.0 years (7.0 ttl pk-yrs)    Types: Cigarettes    Start date: 09/27/1966    Quit date: 09/26/1973    Years since quitting: 49.9   Smokeless tobacco: Current    Types: Chew   Tobacco comments:    Chewing tobacco  Vaping Use   Vaping status: Never Used  Substance and Sexual Activity   Alcohol use: No    Alcohol/week: 0.0 standard drinks of alcohol   Drug use: No   Sexual activity: Not Currently  Other Topics Concern   Not on file  Social History Narrative   Lives with wife, has MS, mobility issues.   Social Drivers of Corporate investment banker Strain: Low Risk  (08/27/2023)   Overall Financial Resource Strain  (CARDIA)    Difficulty of Paying Living Expenses: Not hard at all  Food Insecurity: No Food Insecurity (08/31/2023)   Hunger Vital Sign    Worried About Running Out of Food in the Last Year: Never true    Ran Out of Food in the Last Year: Never true  Transportation Needs: No Transportation Needs (08/31/2023)   PRAPARE - Administrator, Civil Service (Medical): No    Lack of Transportation (Non-Medical): No  Physical Activity: Insufficiently Active (08/27/2023)   Exercise Vital Sign    Days of Exercise per Week: 3 days    Minutes of Exercise per Session: 40 min  Stress: No Stress Concern Present (08/27/2023)   [employer] of Occupational Health - Occupational Stress Questionnaire    Feeling of Stress : Not at all  Social Connections: Moderately Isolated (08/31/2023)   Social Connection and Isolation Panel [NHANES]    Frequency of Communication with Friends and Family: Three times a week    Frequency of Social Gatherings with Friends  and Family: Twice a week    Attends Religious Services: Never    Active Member of Clubs or Organizations: No    Attends Engineer, structural: Never    Marital Status: Married   Family History  Problem Relation Age of Onset   Stroke Mother    Heart attack Mother    Diabetes Mother    Hypertension Mother    Renal cancer Father    Diabetes Sister    Heart attack Brother    Diabetes Son    Heart attack Maternal Grandfather    Heart attack Brother    Bladder Cancer Brother    Scheduled Meds:  Chlorhexidine Gluconate Cloth  6 each Topical Daily   ferrous sulfate  325 mg Oral Q breakfast   finasteride  5 mg Oral Daily   insulin aspart  0-5 Units Subcutaneous QHS   insulin aspart  0-9 Units Subcutaneous TID WC   insulin glargine-yfgn  50 Units Subcutaneous Daily   levothyroxine  88 mcg Oral Daily   liver oil-zinc oxide   Topical Daily   pantoprazole  40 mg Oral Daily   sodium chloride flush  3 mL Intravenous Q12H    Continuous Infusions:  cefTRIAXone (ROCEPHIN)  IV 2 g (09/01/23 0206)   dextrose 5 % and 0.9 % NaCl 40 mL/hr at 09/01/23 0630   metronidazole 500 mg (09/01/23 0242)   PRN Meds:.ALPRAZolam, HYDROmorphone (DILAUDID) injection, melatonin, ondansetron (ZOFRAN) IV, oxyCODONE **OR** oxyCODONE, polyethylene glycol Allergies  Allergen Reactions   Farxiga [Dapagliflozin] Other (See Comments)    unable to take Farxiga due to recurrent UTIs, urinary retention and hydronephrosis    Chauncey Mann [Finerenone] Other (See Comments)    hyperkalemia   CBC:    Component Value Date/Time   WBC 10.1 09/01/2023 0147   HGB 10.5 (L) 09/01/2023 0147   HGB 12.1 (L) 07/30/2023 1403   HCT 32.9 (L) 09/01/2023 0147   HCT 38.9 07/30/2023 1403   PLT 159 09/01/2023 0147   PLT 155 07/30/2023 1403   MCV 80.2 09/01/2023 0147   MCV 86 07/30/2023 1403   NEUTROABS 6.6 08/07/2023 1149   NEUTROABS 7.9 (H) 07/30/2023 1403   LYMPHSABS 1.4 08/07/2023 1149   LYMPHSABS 1.3 07/30/2023 1403   MONOABS 1.0 08/07/2023 1149   EOSABS 0.2 08/07/2023 1149   EOSABS 0.3 07/30/2023 1403   BASOSABS 0.0 08/07/2023 1149   BASOSABS 0.0 07/30/2023 1403   Comprehensive Metabolic Panel:    Component Value Date/Time   NA 129 (L) 09/01/2023 0147   NA 131 (L) 07/30/2023 1403   K 4.3 09/01/2023 0147   CL 100 09/01/2023 0147   CO2 18 (L) 09/01/2023 0147   BUN 35 (H) 09/01/2023 0147   BUN 32 (H) 07/30/2023 1403   CREATININE 1.73 (H) 09/01/2023 0147   CREATININE 1.13 01/06/2013 1311   GLUCOSE 67 (L) 09/01/2023 0147   CALCIUM 9.1 09/01/2023 0147   AST 148 (H) 09/01/2023 0147   ALT 292 (H) 09/01/2023 0147   ALKPHOS 1,066 (H) 09/01/2023 0147   BILITOT 10.2 (H) 09/01/2023 0147   BILITOT <0.2 07/30/2023 1403   PROT 6.6 09/01/2023 0147   PROT 6.7 07/30/2023 1403   ALBUMIN 2.8 (L) 09/01/2023 0147   ALBUMIN 3.9 07/30/2023 1403    Physical Exam: Vital Signs: BP 110/63 (BP Location: Left Arm)   Pulse 85   Temp 97.7 F (36.5 C) (Oral)    Resp 12   Ht 5\' 6"  (1.676 m)   Wt 68 kg  SpO2 98%   BMI 24.20 kg/m  SpO2: SpO2: 98 % O2 Device: O2 Device: Room Air O2 Flow Rate:   Intake/output summary:  Intake/Output Summary (Last 24 hours) at 09/01/2023 1021 Last data filed at 09/01/2023 1610 Gross per 24 hour  Intake 2212.93 ml  Output 500 ml  Net 1712.93 ml   LBM: Last BM Date : 08/30/23 Baseline Weight: Weight: 68 kg Most recent weight: Weight: 68 kg  General: Sleeping, lethargic if attempt to awaken, jaundiced, ill-appearing Cardiovascular: RRR Respiratory: no increased work of breathing noted, not in respiratory distress Skin: Jaundiced Neuro: Sleeping, lethargic if attempt to awaken          Palliative Performance Scale: 10%              Additional Data Reviewed: Recent Labs    08/31/23 1501 09/01/23 0147  WBC 11.5* 10.1  HGB 10.8* 10.5*  PLT 168 159  NA 120* 129*  BUN 41* 35*  CREATININE 2.18* 1.73*    Imaging: CT ABDOMEN PELVIS W CONTRAST CLINICAL DATA:  Metastatic pancreatic adenocarcinoma.  Jaundice.  * Tracking Code: BO *  EXAM: CT ABDOMEN AND PELVIS WITH CONTRAST  TECHNIQUE: Multidetector CT imaging of the abdomen and pelvis was performed using the standard protocol following bolus administration of intravenous contrast.  RADIATION DOSE REDUCTION: This exam was performed according to the departmental dose-optimization program which includes automated exposure control, adjustment of the mA and/or kV according to patient size and/or use of iterative reconstruction technique.  CONTRAST:  75mL OMNIPAQUE IOHEXOL 300 MG/ML  SOLN  COMPARISON:  PET-CT 08/08/2023  FINDINGS: Lower chest: Coronary artery atheromatous vascular calcification. Small type 1 hiatal hernia. Mild atelectasis in both lower lobes.  Hepatobiliary: Hypodense liver lesions compatible with biopsy-proven metastatic adenocarcinoma. Indistinctly marginated lesion inferiorly in the right hepatic lobe measures 2.6 by 1.8  cm on image 38 series 2. Multiple other lesions are present.  Currently no there is no extrahepatic biliary dilatation, although mild intrahepatic biliary dilatation is suggested in segment 8. Gallbladder unremarkable.  Pancreas: Infiltrative mass of the pancreatic body and adjacent tail, also extending around adjacent vascular structures including the splenic artery. Splenic vein poorly seen, presumably thrombosed. 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 The SMV appears patent below the level of the mass. Because of its infiltrative nature of the mass is difficult to measure but is approximately 5.5 by 4.8 cm on image 37 series 2.  Spleen: Subtle splenomegaly. There is some mild heterogeneity medially in the spleen, although there is no abnormal hypermetabolic activity in this vicinity on prior PET-CT and this may be an incidental finding.  Adrenals/Urinary Tract: Hazy infiltration from the pancreatic mass abuts the medial limb of the left adrenal gland. Foley catheter in place. Kidneys unremarkable.  Stomach/Bowel: Redundant sigmoid colon. Borderline wall thickening in the rectum. No dilated bowel. Prominent stool throughout the colon favors constipation.  Vascular/Lymphatic: Atherosclerosis is present, including aortoiliac atherosclerotic disease.  Reproductive: Unremarkable  Other: Trace free pelvic fluid, nonspecific.  Musculoskeletal: Mild levoconvex lumbar scoliosis. Lumbar spondylosis and degenerative disc disease. Bilateral degenerative hip arthropathy.  IMPRESSION: 1. Infiltrative mass of the pancreatic body and adjacent tail, also extending around adjacent vascular structures including the splenic artery. Splenic vein poorly seen, presumably thrombosed. 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 The SMV appears patent below the level of the mass. 2. Multiple hypodense liver lesions  compatible with biopsy-proven metastatic adenocarcinoma. Currently no there is no extrahepatic biliary  dilatation, although mild intrahepatic biliary dilatation is suggested in segment 8. 3. Subtle splenomegaly. There is some mild heterogeneity medially in the spleen, although there is no abnormal hypermetabolic activity in this vicinity on prior PET-CT and this may be an incidental finding. 4. Prominent stool throughout the colon favors constipation. 5. Trace free pelvic fluid, nonspecific. 6. Small type 1 hiatal hernia. 7. Coronary artery atheromatous vascular calcification. 8. Mild levoconvex lumbar scoliosis. Lumbar spondylosis and degenerative disc disease. 9. Bilateral degenerative hip arthropathy. 10.  Aortic Atherosclerosis (ICD10-I70.0).  Electronically Signed   By: Gaylyn Rong M.D.   On: 08/31/2023 19:14    I personally reviewed recent imaging.   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  Since admission, patient has been receiving management for acute liver injury of predominantly cholestatic pattern, hypotension and hypovolemia, AKI, hyponatremia, and encephalopathy.  GI and IR consulted regarding recommendations.  Palliative medicine team consulted to assist with complex medical decision making.  # Complex medical decision making/goals of care  -Patient unable to participate in medical decision making secondary to mental status.  -Spoke with patient's wife and daughter at bedside.  Discussed possible pathways for medical care moving forward.  Family noted that patient would not want to  undergo aggressive medical interventions knowing that he has a cancer that cannot be cured.  Family would like patient to be able to die peacefully and comfortably at home with hospice support.  Noted would involve TOC to assist with coordination of discharge planning.  -Discontinuing interventions that could cause further pain such as lab work and imaging/interventions.  At family's request we will continue antibiotics and fluids to hopefully allow patient more quality time at home.    Code Status: Limited: Do not attempt resuscitation (DNR) -DNR-LIMITED -Do Not Intubate/DNI    -Discussed CODE STATUS with family who noted that patient would not want to receive chest compressions or be put on life support.  Patient's CODE STATUS has appropriately been updated to DNR/DNI.  Placed signed Gold DNR form on file.  # Symptom management Patient is receiving these palliative interventions for symptom management with an intent to improve quality of life.     -Pain/Dyspnea, acute in the setting of end-of-life care                Patient was not on medications for pain previously.   -Receiving oral oxycodone 2.5-5 mg every 6 hours as needed                               -Receiving IV Dilaudid 0.5 mg every 4 hours as needed breakthrough                  -Anxiety/agitation, in the setting of end-of-life care                               -Start oral Ativan solution 0.25 mg every 4 hours as needed  # Psycho-social/Spiritual Support:  - Support System: Wife, daughter  # Discharge Planning:  Home with Hospice -Involve TOC to assist with discharge coordination.  Family specifically requesting hospice in [place] as this is the hospice that assisted with patient's father's death.  Thank you for allowing the palliative care team to participate in the care [facility].  Alvester Morin, DO Palliative Care Provider PMT # [phone]  If patient remains symptomatic despite maximum doses, please call  PMT at [phone] between 0700 and 1900. Outside of these hours, please call attending, as PMT does not have night coverage.

## 2023-09-01 NOTE — Progress Notes (Signed)
PT Cancellation Note  Patient Details Name: Jacob Fowler MRN: [id number] DOB: Jun 01, 1951   Cancelled Treatment:    Reason Eval/Treat Not Completed: PT screened, no needs identified, will sign off, patient requires total care  from family PTA. No skilled PT needs. [facility] Kel[facility] PT Acute Rehabilitation Services Office [phone] Weekend pager-[phone]    Rada Hay 09/01/2023, 10:50 AM

## 2023-09-01 NOTE — Progress Notes (Signed)
Hypoglycemic Event  CBG: 65  Treatment: 8 oz juice/soda  Symptoms: Hungry  Follow-up CBG: Time:1325 CBG Result:182  Possible Reasons for Event: Unknown  Comments/MD notified:hypoglycemic protocol     Curley Spice

## 2023-09-01 NOTE — Plan of Care (Signed)
  Request seen for biliary drain.  Images reviewed by Dr. Miles Costain.  There is currently no biliary dilatation to allow for drain placement. Not amenable to IR intervention at this time.  Team has been made aware.  Kailani Brass S Sharita Bienaime PA-C 09/01/2023 10:14 AM

## 2023-09-01 NOTE — Progress Notes (Signed)
Pt's first time CBG is 70, RN recheck after an hour, CBG is 61. 1/2 amp D50 given, will recheck the CBG in 15 mins.

## 2023-09-01 NOTE — TOC Initial Note (Signed)
Transition of Care [facility]) - Initial/Assessment Note    Patient Details  Name: Jacob Fowler MRN: [id number] Date of Birth: January 02, 1951  Transition of Care [facility]) CM/SW Contact:    Adrian Prows, RN Phone Number: 09/01/2023, 5:15 PM  Clinical Narrative:                 [facility] consulted for home hospice; spoke w/ pt's family at bedside; his POC is dtr [name] [phone]); she says pt lives at home w/ spouse [name]); they plan for him to d/c home w/ hospice; she verified pt's insurance/PCP; she denies pt experiencing SDOH risks; pt has cane, walker, wheelchair, BSC, shower chair, lift chair; fhey plan for transportation to be provided per hospice agency; amily given list of home hospice agencies; their preference is [facility]); called agency and placed referral w/ Rochelle at 1650; she will have the on-call RN contact this RN, CM; awaiting return call.  -1710- return call from [place], RN from [place]; she will contact pt's dtr; TOC is following.  Expected Discharge Plan: Home w Hospice Care Barriers to Discharge: Continued Medical Work up   Patient Goals and CMS Choice Patient states their goals for this hospitalization and ongoing recovery are:: pt's dtr [name] says he will d/c home w/ hospice CMS Medicare.gov Compare Post Acute Care list provided to:: Patient Represenative (must comment) ([name] (dtr))        Expected Discharge Plan and Services   Discharge Planning Services: CM Consult Post Acute Care Choice: Hospice Living arrangements for the past 2 months: Single Family Home                                      Prior Living Arrangements/Services Living arrangements for the past 2 months: Single Family Home Lives with:: Spouse Patient language and need for interpreter reviewed:: Yes Do you feel safe going back to the place where you live?: Yes      Need for Family Participation in Patient Care: Yes  (Comment) Care giver support system in place?: Yes (comment) Current home services: DME (cane, walker, wheelchair, BSC, shower chair, lift chair) Criminal Activity/Legal Involvement Pertinent to Current Situation/Hospitalization: No - Comment as needed  Activities of Daily Living   ADL Screening (condition at time of admission) Independently performs ADLs?: No Does the patient have a NEW difficulty with bathing/dressing/toileting/self-feeding that is expected to last >3 days?: No Does the patient have a NEW difficulty with getting in/out of bed, walking, or climbing stairs that is expected to last >3 days?: No Does the patient have a NEW difficulty with communication that is expected to last >3 days?: No Is the patient deaf or have difficulty hearing?: No Does the patient have difficulty seeing, even when wearing glasses/contacts?: No Does the patient have difficulty concentrating, remembering, or making decisions?: No  Permission Sought/Granted Permission sought to share information with : Case Manager Permission granted to share information with : Yes, Verbal Permission Granted  Share Information with NAME: Case Manager     Permission granted to share info w Relationship: [name] (dtr) [phone]     Emotional Assessment Appearance:: Appears stated age Attitude/Demeanor/Rapport: Unable to Assess Affect (typically observed): Unable to Assess Orientation: :  (unable to assess) Alcohol / Substance Use: Not Applicable Psych Involvement: No (comment)  Admission diagnosis:  Jaundice [R17] Patient Active Problem List   Diagnosis Date Noted  Intrahepatic cholestasis 09/01/2023   Pressure injury of skin 09/01/2023   Pancreatic adenocarcinoma (HCC) 09/01/2023   Palliative care encounter 09/01/2023   Goals of care, counseling/discussion 09/01/2023   Agitation 09/01/2023   Counseling and coordination of care 09/01/2023   Need for emotional support 09/01/2023   Malignant  neoplasm metastatic to liver [facility]) 09/01/2023   DNR (do not resuscitate) 09/01/2023   Jaundice 08/31/2023   Pancreatic mass 06/27/2023   acute right-sided epididymoorchitis. 06/27/2023   Chronic indwelling Foley catheter 06/26/2023   Hyponatremia 06/26/2023   History of bladder stone 01/22/2023   AKI (acute kidney injury) (HCC) 11/30/2020   Inguinal hernia 11/30/2020   BPH with obstruction/lower urinary tract symptoms 11/30/2020   Hypothyroidism 07/28/2020   Gastroesophageal reflux disease 07/28/2020   B12 deficiency 07/28/2020   Multiple falls 02/09/2020   Memory loss 08/11/2019   Vitamin D deficiency 04/25/2019   Irritable bowel syndrome with constipation 04/25/2019   RBBB 08/14/2018   Combined forms of age-related cataract of right eye 08/30/2017   Aortic atherosclerosis (HCC) 04/10/2016   Rosacea 11/23/2015   Right foot drop 02/17/2015   Situational insomnia 02/17/2015   MS (multiple sclerosis) (HCC) 01/06/2013   Atherosclerosis of native coronary artery of native heart with stable angina pectoris (HCC) 10/06/2009   Uncontrolled type 2 diabetes mellitus with hyperglycemia, without long-term current use of insulin (HCC) 10/01/2009   Hyperlipidemia associated with type 2 diabetes mellitus (HCC) 10/01/2009   Hypertension associated with type 2 diabetes mellitus (HCC) 10/01/2009   PCP:  Bennie Pierini, FNP Pharmacy:   [facility] [place], [place] - [address] [place] [address] Phone: [phone] Fax: [phone]  [id number] - [place], [place] - [address] [place] [address] Phone: [phone] Fax: [phone]  [facility] - [place], [place] - [address] [place] [address] Phone: [phone] Fax: [phone]  [id number] - [place], [place] - [address] [facility] [address] [address] [place] [address] Phone: [phone] Fax: [phone]     Social Drivers of Health (SDOH) Social History: SDOH Screenings   Food Insecurity: No Food Insecurity (09/01/2023)  Housing: Low Risk  (09/01/2023)  Transportation Needs: No Transportation Needs (09/01/2023)  Utilities: Not At Risk (09/01/2023)  Alcohol Screen: Low Risk  (08/27/2023)  Depression (PHQ2-9): Low Risk  (08/27/2023)  Financial Resource Strain: Low Risk  (08/27/2023)  Physical Activity: Insufficiently Active (08/27/2023)  Social Connections: Moderately Isolated (08/31/2023)  Stress: No Stress Concern Present (08/27/2023)  Tobacco Use: High Risk (08/31/2023)  Health Literacy: Adequate Health Literacy (08/27/2023)   SDOH Interventions: Food Insecurity Interventions: Intervention Not Indicated, Inpatient TOC Transportation Interventions: Intervention Not Indicated, Inpatient TOC Utilities Interventions: Intervention Not Indicated, Inpatient TOC   Readmission Risk Interventions    09/01/2023    5:06 PM  Readmission Risk Prevention Plan  Transportation Screening Complete  PCP or Specialist Appt within 3-5 Days Complete  HRI or Home Care Consult Complete  Social Work Consult for Recovery Care Planning/Counseling Complete  Palliative Care Screening Complete  Medication Review Oceanographer) Complete

## 2023-09-01 NOTE — Progress Notes (Signed)
PROGRESS NOTE    Jacob Fowler  WGN:[id number] DOB: June 22, 1951 DOA: 08/31/2023 PCP: Bennie Pierini, FNP    Brief Narrative:   Jacob Fowler is a 73 y.o. 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  Few days prior, noted his urine has become darker.  After nurse noted that his skin has become discolored/jaundice was sent to the ED for further evaluation.  Also slightly more confused over the last few days and at baseline he is dependent on most ADLs (other than feeding), however typically he is oriented and interactive.  He has had decreased intake with associated nausea and vomiting.  He also has chronic abdominal pain which is ongoing and no worse than prior to biopsy.  Family denied any recent fevers, chills.  In the ED, temperature 97.6 F, HR 95, RR 15, BP 87/54, SpO2 94% on room air.  WBC 11.5, hemoglobin 10.8, platelet count 168.  Sodium 120, potassium 4.5, chloride 90, CO2 17, glucose 227, BUN 41, creatinine 2.18.  Alk phos 1207, AST 175, ALT 348, total bilirubin 10.2.  Lactic acid 1.3.  INR 1.1.  Urinalysis with large leukocytes, small hemoglobin, no bacteria, greater than 50 WBCs.  CT abdomen/pelvis with contrast with infiltrative mass pancreatic body and adjacent tail extending around adjacent vascular structures including splenic artery, multiple hypodense liver lesions compatible with biopsy-proven metastatic adenocarcinoma, no extrahepatic biliary dilation, mild heterogenicity medially in the spleen, prominent stool throughout colon consistent with constipation.  [facility] was consulted.  TRH consulted for admission and patient was transferred to [facility] for further evaluation and management.  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  Recent diagnosis of pancreatic adenocarcinoma via biopsy on 08/27/2023.  Previous bilirubin on 08/07/2023, 0.9 within normal limits.  CT abdomen/pelvis with noted infiltrative mass pancreatic body and adjacent tail around adjacent vascular structures and multiple hypodense liver lesions consistent with liver metastasis and no extrahepatic biliary dilation.  Evaluated by interventional radiology for consideration of percutaneous drainage, currently no biliary dilation to allow for drain placement and not amenable to IR intervention at this time.  Seen by [facility] gastroenterology, Dr. Lorenso Quarry who discussed with advanced endoscopist, Dr. Elnoria Howard; no role for endoscopic intervention at this time.  Given his significant decline in the setting of his recent pancreatic adenocarcinoma diagnosis with overall poor prognosis, palliative care was consulted and patient/family wishes for no further aggressive intervention and inquiry into home hospice. -- Palliative care, medical oncology following; appreciate assistance -- Ceftriaxone 2 g IV every 24 hours -- Metronidazole 500 mg IV every 12 hours -- Decadron 4 mg IV every 24 hours -- TOC consulted for home hospice referral  Hypotension, hypovolemic Hx hypertension Patient was hypotensive on admission, responded to IV fluids. -- Holding home isosorbide mononitrate, and olmesartan  Acute metabolic encephalopathy Patient with a few days of decreased interaction with family although awake and alert. On initial evaluation he  is mostly oriented other than the situation and to the month.  Suspect this is metabolic from his liver injury, possibly there may be hyperammonemia as well. Also has borderline severe range hyponatremia which may contribute.   Hypovolemic hyponatremia Sodium 120 on admission, likely secondary to severe dehydration in the setting of poor oral intake.  Supported with IV fluid hydration with improvement of sodium to 129.  Now family moving towards comfort measures. 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 Bicarb 17, AG 13. Lactate 1.3. Suspect prerenal with decreased intake. Has foley in place.  Supported with IV fluid hydration. -- Cr 2.1>1.73 -- Encourage increased oral intake, no further lab draws IV fluids  Multiple sclerosis with quadriparesis: Left buttock stage II pressure injury, POA Pressure Injury 08/31/23 Buttocks Left;Upper Stage 2 -  Partial thickness loss of dermis presenting as a shallow open injury with a red, pink wound bed without slough. (Active)  08/31/23 2231  Location: Buttocks  Location Orientation: Left;Upper  Staging: Stage 2 -  Partial thickness loss of dermis presenting as a shallow open injury with a red, pink wound bed without slough.  Wound Description (Comments):   Present on Admission: Yes     Pressure Injury Buttocks Right Stage 3 -  Full thickness tissue loss. Subcutaneous fat may be visible but bone, tendon or muscle are NOT exposed. (Active)     Location: Buttocks  Location Orientation: Right  Staging: Stage 3 -  Full thickness tissue loss. Subcutaneous fat may be visible but bone, tendon or muscle are NOT exposed.  Wound Description (Comments):   Present on Admission: Yes     Pressure Injury 08/31/23 Buttocks Right;Left Stage 1 -  Intact skin with non-blanchable redness of a localized area  usually over a bony prominence. (Active)  08/31/23 2233  Location: Buttocks  Location Orientation: Right;Left  Staging: Stage 1 -  Intact skin with non-blanchable redness of a localized area usually over a bony prominence.  Wound Description (Comments):   Present on Admission: Yes  -- Continue frequent turning, offloading, supportive care  CAD:  -- Discontinue statin due to liver injury    Type II diabetes melitis, with hyperglycemia:  Home insulin regimen is glargine 65 units which she takes in the morning, Ozempic weekly. -- Discontinue basal insulin given hypoglycemic episode related to poor oral intake -- Sensitive SSI for coverage -- CBG before every meal/at bedtime -- discontinue Ozempic given his advanced pancreatic cancer and GI symptoms.   Hypothyroidism:  -- Continues levothyroxine 88 mcg p.o. daily  BPH:  -- Continue home finasteride 5 mg p.o. daily -- Hold home Silodosin.   Chronic Foley:  -- Routine Foley care  Chronic anemia, hx B12, IDA:  -- Continue home B12 injection monthly, home iron.   Mood d/o:  -- Continue home Xanax at bedtime prn    DVT prophylaxis: SCDs Start: 08/31/23 2256    Code Status: Limited: Do not attempt resuscitation (DNR) -DNR-LIMITED -Do Not Intubate/DNI  Family Communication:   Disposition Plan:  Level of care: Med-Surg Status is: Inpatient Remains inpatient appropriate because: Pending referral to home hospice    Consultants:  [facility] gastroenterology Interventional radiology Medical oncology Palliative care  Procedures:  None  Antimicrobials:  Ceftriaxone Metronidazole   Subjective: Patient seen examined bedside, resting calmly.  Slightly confused, multifamily members present at bedside.  Discussed with patient and family regarding overall poor prognosis regarding findings on imaging,  labs.  Seen by [facility] gastroenterology this morning, discussed with advanced endoscopist and no endoscopic intervention available at  this time.  Seen by interventional radiology as well with no site for biliary drainage without ductal dilation.  Consulted his primary medical oncologist we will see later this afternoon.  Discussed with palliative care, seen with family now desiring to return home with hospice care.  TOC consulted for assistance.  Objective: Vitals:   08/31/23 2151 09/01/23 0218 09/01/23 0619 09/01/23 1037  BP: (!) 107/52 95/68 110/63 96/69  Pulse: 87 86 85 82  Resp: 20 16 12 15   Temp: 98.5 F (36.9 C) 97.6 F (36.4 C) 97.7 F (36.5 C) (!) 97.5 F (36.4 C)  TempSrc: Oral Oral Oral Axillary  SpO2: 98% 99% 98% 98%  Weight:      Height:        Intake/Output Summary (Last 24 hours) at 09/01/2023 1531 Last data filed at 09/01/2023 4098 Gross per 24 hour  Intake 2212.93 ml  Output 500 ml  Net 1712.93 ml   Filed Weights   08/31/23 1444  Weight: 68 kg    Examination:  Physical Exam: GEN: NAD, alert, confused, chronically ill in appearance HEENT: NCAT, PERRL, EOMI, + scleral icterus, dry mucous membranes PULM: CTAB w/o wheezes/crackles, normal respiratory effort, on room air CV: RRR w/o M/G/R GI: abd soft, NTND, + BS GU: Foley catheter noted MSK: no peripheral edema Integumentary: Jaundice skin    Data Reviewed: I have personally reviewed following labs and imaging studies  CBC: Recent Labs  Lab 08/27/23 1148 08/31/23 1501 09/01/23 0147  WBC 12.2* 11.5* 10.1  HGB 11.5* 10.8* 10.5*  HCT 33.8* 31.5* 32.9*  MCV 76.8* 76.5* 80.2  PLT 188 168 159   Basic Metabolic Panel: Recent Labs  Lab 08/31/23 1501 09/01/23 0147  NA 120* 129*  K 4.5 4.3  CL 90* 100  CO2 17* 18*  GLUCOSE 227* 67*  BUN 41* 35*  CREATININE 2.18* 1.73*  CALCIUM 9.2 9.1  MG  --  2.2  PHOS  --  3.2   GFR: Estimated Creatinine Clearance: 34.3 mL/min (A) (by C-G formula based on SCr of 1.73 mg/dL (H)). Liver Function Tests: Recent Labs  Lab 08/31/23 1501 09/01/23 0147  AST 175* 148*  ALT 348* 292*   ALKPHOS 1,207* 1,066*  BILITOT 10.2* 10.2*  PROT 7.3 6.6  ALBUMIN 3.8 2.8*   No results for input(s): "LIPASE", "AMYLASE" in the last 168 hours. Recent Labs  Lab 08/31/23 1508 09/01/23 0147  AMMONIA 57* 28   Coagulation Profile: Recent Labs  Lab 08/27/23 1148 08/31/23 1508  INR 1.1 1.1   Cardiac Enzymes: No results for input(s): "CKTOTAL", "CKMB", "CKMBINDEX", "TROPONINI" in the last 168 hours. BNP (last 3 results) No results for input(s): "PROBNP" in the last 8760 hours. HbA1C: Recent Labs    09/01/23 0147  HGBA1C 9.4*   CBG: Recent Labs  Lab 09/01/23 0404 09/01/23 0616 09/01/23 0643 09/01/23 1203 09/01/23 1325  GLUCAP 91 62* 124* 65* 182*   Lipid Profile: No results for input(s): "CHOL", "HDL", "LDLCALC", "TRIG", "CHOLHDL", "LDLDIRECT" in the last 72 hours. Thyroid Function Tests: No results for input(s): "TSH", "T4TOTAL", "FREET4", "T3FREE", "THYROIDAB" in the last 72 hours. Anemia Panel: No results for input(s): "VITAMINB12", "FOLATE", "FERRITIN", "TIBC", "IRON", "RETICCTPCT" in the last 72 hours. Sepsis Labs: Recent Labs  Lab 08/31/23 1537  LATICACIDVEN 1.3    No results found for this or any previous visit (from the past 240 hours).  Radiology Studies: CT ABDOMEN PELVIS W CONTRAST Result Date: 08/31/2023 CLINICAL DATA:  Metastatic pancreatic adenocarcinoma.  Jaundice. * Tracking Code: BO * EXAM: CT ABDOMEN AND PELVIS WITH CONTRAST TECHNIQUE: Multidetector CT imaging of the abdomen and pelvis was performed using the standard protocol following bolus administration of intravenous contrast. RADIATION DOSE REDUCTION: This exam was performed according to the departmental dose-optimization program which includes automated exposure control, adjustment of the mA and/or kV according to patient size and/or use of iterative reconstruction technique. CONTRAST:  75mL OMNIPAQUE IOHEXOL 300 MG/ML  SOLN COMPARISON:  PET-CT 08/08/2023 FINDINGS: Lower chest:  Coronary artery atheromatous vascular calcification. Small type 1 hiatal hernia. Mild atelectasis in both lower lobes. Hepatobiliary: Hypodense liver lesions compatible with biopsy-proven metastatic adenocarcinoma. Indistinctly marginated lesion inferiorly in the right hepatic lobe measures 2.6 by 1.8 cm on image 38 series 2. Multiple other lesions are present. Currently no there is no extrahepatic biliary dilatation, although mild intrahepatic biliary dilatation is suggested in segment 8. Gallbladder unremarkable. Pancreas: Infiltrative mass of the pancreatic body and adjacent tail, also extending around adjacent vascular structures including the splenic artery. Splenic vein poorly seen, presumably thrombosed. 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 The SMV appears patent below the level of the mass. Because of its infiltrative nature of the mass is difficult to measure but is approximately 5.5 by 4.8 cm on image 37 series 2. Spleen: Subtle splenomegaly. There is some mild heterogeneity medially in the spleen, although there is no abnormal hypermetabolic activity in this vicinity on prior PET-CT and this may be an incidental finding. Adrenals/Urinary Tract: Hazy infiltration from the pancreatic mass abuts the medial limb of the left adrenal gland. Foley catheter in place. Kidneys unremarkable. Stomach/Bowel: Redundant sigmoid colon. Borderline wall thickening in the rectum. No dilated bowel. Prominent stool throughout the colon favors constipation. Vascular/Lymphatic: Atherosclerosis is present, including aortoiliac atherosclerotic disease. Reproductive: Unremarkable Other: Trace free pelvic fluid, nonspecific. Musculoskeletal: Mild levoconvex lumbar scoliosis. Lumbar spondylosis and degenerative disc disease. Bilateral degenerative hip arthropathy. IMPRESSION: 1. Infiltrative mass of the pancreatic body and adjacent tail, also extending around adjacent vascular structures  including the splenic artery. Splenic vein poorly seen, presumably thrombosed. 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 The SMV appears patent below the level of the mass. 2. Multiple hypodense liver lesions compatible with biopsy-proven metastatic adenocarcinoma. Currently no there is no extrahepatic biliary dilatation, although mild intrahepatic biliary dilatation is suggested in segment 8. 3. Subtle splenomegaly. There is some mild heterogeneity medially in the spleen, although there is no abnormal hypermetabolic activity in this vicinity on prior PET-CT and this may be an incidental finding. 4. Prominent stool throughout the colon favors constipation. 5. Trace free pelvic fluid, nonspecific. 6. Small type 1 hiatal hernia. 7. Coronary artery atheromatous vascular calcification. 8. Mild levoconvex lumbar scoliosis. Lumbar spondylosis and degenerative disc disease. 9. Bilateral degenerative hip arthropathy. 10.  Aortic Atherosclerosis (ICD10-I70.0). Electronically Signed   By: Gaylyn Rong M.D.   On: 08/31/2023 19:14        Scheduled Meds:  Chlorhexidine Gluconate Cloth  6 each Topical Daily   dexamethasone (DECADRON) injection  4 mg Intravenous Daily   ferrous sulfate  325 mg Oral Q breakfast   finasteride  5 mg Oral Daily   insulin aspart  0-5 Units Subcutaneous QHS   insulin aspart  0-9 Units Subcutaneous TID WC   levothyroxine  88 mcg Oral Daily   liver oil-zinc oxide   Topical Daily  pantoprazole  40 mg Oral Daily   sodium chloride flush  3 mL Intravenous Q12H   Continuous Infusions:  cefTRIAXone (ROCEPHIN)  IV 2 g (09/01/23 0206)   dextrose 5 % and 0.9 % NaCl 40 mL/hr at 09/01/23 0630   metronidazole 500 mg (09/01/23 1333)     LOS: 1 day    Time spent: 56 minutes spent on chart review, discussion with nursing staff, consultants, updating family and interview/physical exam; more than 50% of that time was spent in counseling and/or  coordination of care.    Alvira Philips [place], DO [facility] Available via Epic secure chat 7am-7pm After these hours, please refer to coverage provider listed on [email or web address] 09/01/2023, 3:31 PM

## 2023-09-01 NOTE — Progress Notes (Signed)
Pt's CBG is 62, 1/2 amp D50 given, D5-0.9%NS given per order. Will recheck CBG in 15 mins.

## 2023-09-01 NOTE — H&P (Addendum)
History and Physical    Jacob Fowler WUJ:[id number] DOB: 07/16/1951 DOA: 08/31/2023  PCP: Bennie Pierini, FNP   Patient coming from: Transfer from [facility] ED   Chief Complaint:  Chief Complaint  Patient presents with   Jaudice   Hypotension    HPI: History is provided by patient's daughter and wife at the bedside due to mild confusion Jacob Fowler is a 73 y.o. 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 ED for jaundice.   Daughter reports that a few days ago noted that his urine had become darker.  In today noted to be jaundice so referred to the emergency department.  Also has been slightly more confused over the past few days.  His baseline is dependent on most ADLs (other than feeding) however he is typically oriented and interactive.  More recently has been less interactive, although alert and awake.  Otherwise family denies any recent fevers, chills.  He has chronic abdominal pain which is ongoing.  Not worsened since the biopsy. Has had decreased intake, nausea, and vomiting.    Review of Systems:  ROS complete and negative except as marked above   Allergies  Allergen Reactions   Farxiga [Dapagliflozin] Other (See Comments)    unable to take Marcelline Deist due to recurrent UTIs, urinary retention and hydronephrosis    Chauncey Mann [Finerenone] Other (See Comments)    hyperkalemia    Prior to Admission medications   Medication Sig Start Date End Date Taking? Authorizing Provider  acetaminophen (TYLENOL) 325 MG tablet Take 2 tablets (650 mg total) by mouth every 6 (six) hours as needed for mild pain (pain score 1-3) (or Fever >/= 101). 06/29/23   Shon Hale, MD  ALPRAZolam Prudy Feeler) 0.5 MG tablet Take 1  tablet (0.5 mg total) by mouth at bedtime as needed for anxiety. 08/07/23   Pasam, Archie Patten, MD  Ascorbic Acid (VITAMIN C PO) Take 500 mg by mouth in the morning and at bedtime.    [provider]  aspirin 81 MG EC tablet Take 81 mg by mouth daily.    [provider]  Cholecalciferol (VITAMIN D) 50 MCG (2000 UT) tablet Take 2,000 Units by mouth daily.    [provider]  cyanocobalamin (VITAMIN B12) 1000 MCG/ML injection INJECT INTO THE SKIN ONCE A MONTH 11/01/22   Daphine Deutscher, Mary-Margaret, FNP  ferrous sulfate 325 (65 FE) MG tablet Take 1 tablet (325 mg total) by mouth daily with breakfast. 07/30/23   Daphine Deutscher, Mary-Margaret, FNP  finasteride (PROSCAR) 5 MG tablet Take 1 tablet (5 mg total) by mouth daily. 01/26/23   Bennie Pierini, FNP  glucose blood test strip Use as instructed 07/06/14   Ernestina Penna, MD  insulin glargine (LANTUS SOLOSTAR) 100 UNIT/ML Solostar Pen Inject 65 Units into the skin daily. 07/30/23   Bennie Pierini, FNP  Insulin Pen Needle 31G X 5 MM MISC Use with lantus pen as directed 04/16/23   Bennie Pierini, FNP  isosorbide mononitrate (IMDUR) 60 MG 24 hr tablet TAKE 1/2 TABLET (30MG  TOTAL) BY MOUTH DAILY 07/30/23   Daphine Deutscher, Mary-Margaret, FNP  leptosperum manuka honey (THERAHONEY) GEL gel Apply 1 Application topically daily. Apply daily to necrotic areas of Buttocks Patient not taking: Reported on 08/07/2023 06/30/23   Shon Hale, MD  levothyroxine (SYNTHROID) 88 MCG tablet Take 1  tablet (88 mcg total) by mouth daily. 04/17/23   Daphine Deutscher Mary-Margaret, FNP  liver oil-zinc oxide (DESITIN) 40 % ointment Apply topically 2 (two) times daily. Apply Thin layer of Desitin  (Zinc Oxide) 2 times daily and as additional application needed for soiling. 06/29/23   Emokpae, Courage, MD  magnesium oxide (MAG-OX) 400 MG tablet Take 1 tablet by mouth daily.    [provider]  nystatin (MYCOSTATIN/NYSTOP) powder Apply 1 Application topically 3  (three) times daily. 08/23/23   Daphine Deutscher, Mary-Margaret, FNP  olmesartan (BENICAR) 5 MG tablet Take 1 tablet (5 mg total) by mouth daily. 07/30/23 07/29/24  Daphine Deutscher Mary-Margaret, FNP  omeprazole (PRILOSEC) 20 MG capsule Take 1 capsule (20 mg total) by mouth daily. 07/30/23   Daphine Deutscher, Mary-Margaret, FNP  ondansetron (ZOFRAN) 4 MG tablet Take 1 tablet (4 mg total) by mouth every 8 (eight) hours as needed for nausea or vomiting. 08/06/23   Daphine Deutscher, Mary-Margaret, FNP  rosuvastatin (CRESTOR) 10 MG tablet Take 1 tablet (10 mg total) by mouth daily. 04/16/23   Daphine Deutscher Mary-Margaret, FNP  Semaglutide,0.25 or 0.5MG /DOS, (OZEMPIC, 0.25 OR 0.5 MG/DOSE,) 2 MG/3ML SOPN Inject 0.25 mg into the skin once a week. 07/30/23   Daphine Deutscher Mary-Margaret, FNP  silodosin (RAPAFLO) 8 MG CAPS capsule Take 1 capsule (8 mg total) by mouth at bedtime. 04/16/23   Daphine Deutscher, Mary-Margaret, FNP  sodium bicarbonate 650 MG tablet Take 650 mg by mouth 3 (three) times daily.    [provider]  sodium citrate-citric acid (ORACIT) 500-334 MG/5ML solution Take 15 mLs by mouth 2 (two) times daily. 02/06/23   [provider]  SPS, SODIUM POLYSTYRENE SULF, 15 GM/60ML suspension Take 15 mLs by mouth in the morning and at bedtime. 06/11/23   [provider]  sulfamethoxazole-trimethoprim (BACTRIM DS) 800-160 MG tablet Take 1 tablet by mouth 2 (two) times daily. 08/23/23   Daphine Deutscher, Mary-Margaret, FNP  atorvastatin (LIPITOR) 10 MG tablet Take 10 mg by mouth daily.  09/27/11  [provider]  niacin-simvastatin (SIMCOR) 500-20 MG 24 hr tablet Take 1 tablet by mouth at bedtime.    09/27/11  [provider]    Past Medical History:  Diagnosis Date   Acquired leg length discrepancy    Bladder stone    BPH associated with nocturia    CAD (coronary artery disease) CARDIOLOGIST- DR HOCHREIN--  LAST VISIT 09-27-2011 IN EPIC   Non obstructive Cath 2010-  HX CORONARY SPASM 2004   Cataract    bilateral    GERD  (gastroesophageal reflux disease)    H/O hiatal hernia    Heart murmur    Heartburn    High cholesterol    History of nephrolithiasis 2009   Hx of adenomatous colonic polyps    Hyperlipidemia    Hypertension    Movement disorder    Multiple sclerosis (HCC) DX  10/10---  NEUROLOGIST  DR Gerlene Burdock [place] ([place])   RIGHT SIDE AFFECTED MORE W/ WEAKNESS- - USES CANE   Nephrolithiasis 08/30/2020   Neuromuscular disorder (HCC)    MS   NIDDM (non-insulin dependent diabetes mellitus)    Peyronie's disease    RBBB    Renal stone RIGHT   Rosacea    Vision abnormalities     Past Surgical History:  Procedure Laterality Date   CARDIAC CATHETERIZATION  02-26-2009 ---  DR Julien Nordmann   NONOBSTRUCTIVE CAD/ 50% DISTAL RCA/ 30% LAD / NORMAL LVF   CYSTOSCOPY W/ RETROGRADES  10/30/2011   Procedure: CYSTOSCOPY WITH RETROGRADE PYELOGRAM;  Surgeon: Garnett Farm, MD;  Location: [facility];  Service: Urology;  Laterality: Right;   CYSTOSCOPY WITH LITHOLAPAXY  10/30/2011   Procedure: CYSTOSCOPY WITH LITHOLAPAXY;  Surgeon: Garnett Farm, MD;  Location[facility];  Service: Urology;  Laterality: N/A;   CYSTOSCOPY WITH RETROGRADE PYELOGRAM, URETEROSCOPY AND STENT PLACEMENT Left 11/25/2020   Procedure: CYSTOSCOPY WITH LEFT RETROGRADE PYELOGRAM, LEFT URETEROSCOPY WITH LASER AND LEFT URETERAL STENT PLACEMENT;  Surgeon: Malen Gauze, MD;  Location: [facility];  Service: Urology;  Laterality: Left;   EXTRACORPOREAL SHOCK WAVE LITHOTRIPSY  09/05/2010   RIGHT   EYE SURGERY Right 08/30/2018   HOLMIUM LASER APPLICATION Left 11/25/2020   Procedure: HOLMIUM LASER APPLICATION;  Surgeon: Malen Gauze, MD;  Location: [facility];  Service: Urology;  Laterality: Left;   NESBIT PROCEDURE  10/17/2004   CORRECTION OF PENILE ANGULATION (PEYRONIES DISEASE)   UTI  06/17/2022     reports that he quit smoking about 49 years ago. His smoking use included cigarettes. He started  smoking about 56 years ago. He has a 7 pack-year smoking history. His smokeless tobacco use includes chew. He reports that he does not drink alcohol and does not use drugs.  Family History  Problem Relation Age of Onset   Stroke Mother    Heart attack Mother    Diabetes Mother    Hypertension Mother    Renal cancer Father    Diabetes Sister    Heart attack Brother    Diabetes Son    Heart attack Maternal Grandfather    Heart attack Brother    Bladder Cancer Brother      Physical Exam: Vitals:   08/31/23 1645 08/31/23 1842 08/31/23 1950 08/31/23 2151  BP: 128/71  96/62 (!) 107/52  Pulse: 71  80 87  Resp: 14  16 20   Temp:  98 F (36.7 C)  98.5 F (36.9 C)  TempSrc:  Oral  Oral  SpO2: 97%  98% 98%  Weight:      Height:        Gen: Awake, alert, Chronically ill appearing  CV: Regular, normal S1, S2, no murmurs  Resp: Normal WOB, CTAB  Abd: Flat, firm, hypoactive, nontender MSK: Symmetric, no edema  Skin: Jaundiced. Scattered ecchymosis.  Neuro: Alert and interactive, although distractable. Oriented to person, [facility] long, 2025 (not to month "January"). Not to situation.  Psych: Affect down, difficult to assess with mild confusion    Data review:   Labs reviewed, notable for:   Lactate 1.3 Sodium 120 Bicarb 17, anion gap 13,, Creatinine 2.1 (baseline 1.6) Hyperglycemic to 27 T. bili 10.2, direct 6.2, AST 175, ALT 348, alk phos 1207 WBC 11 Hemoglobin 10, microcytic  Micro:  Results for orders placed or performed during the hospital encounter of 06/26/23  Urine Culture     Status: Abnormal   Collection Time: 06/26/23  2:00 PM   Specimen: Urine, Clean Catch  Result Value Ref Range Status   Specimen Description   Final    URINE, CLEAN CATCH Performed at [facility], [address] [place] [address], [place], [place] [address]    Special Requests   Final    NONE Reflexed from [id number] Performed at [facility], [address], [place], [place] [address]    Culture (A)   Final    >=100,000 COLONIES/mL PSEUDOMONAS AERUGINOSA 50,000 COLONIES/mL KLEBSIELLA AEROGENES    Report Status 06/29/2023 FINAL  Final   Organism ID, Bacteria PSEUDOMONAS AERUGINOSA (A)  Final  Organism ID, Bacteria KLEBSIELLA AEROGENES (A)  Final      Susceptibility   Pseudomonas aeruginosa - MIC*    CEFTAZIDIME 4 SENSITIVE Sensitive     CIPROFLOXACIN 0.5 SENSITIVE Sensitive     GENTAMICIN <=1 SENSITIVE Sensitive     IMIPENEM 1 SENSITIVE Sensitive     CEFEPIME 4 SENSITIVE Sensitive     * >=100,000 COLONIES/mL PSEUDOMONAS AERUGINOSA   Klebsiella aerogenes - MIC*    CEFEPIME <=0.12 SENSITIVE Sensitive     CEFTRIAXONE <=0.25 SENSITIVE Sensitive     CIPROFLOXACIN <=0.25 SENSITIVE Sensitive     GENTAMICIN <=1 SENSITIVE Sensitive     IMIPENEM 1 SENSITIVE Sensitive     NITROFURANTOIN 64 INTERMEDIATE Intermediate     TRIMETH/SULFA <=20 SENSITIVE Sensitive     PIP/TAZO <=4 SENSITIVE Sensitive ug/mL    * 50,000 COLONIES/mL KLEBSIELLA AEROGENES  Blood Culture (routine x 2)     Status: Abnormal   Collection Time: 06/26/23  2:03 PM   Specimen: BLOOD  Result Value Ref Range Status   Specimen Description   Final    BLOOD RIGHT ASSIST CONTROL Performed at [facility], [address] [place], [place] [address]    Special Requests   Final    BOTTLES DRAWN AEROBIC AND ANAEROBIC Blood Culture adequate volume Performed at [facility], [address] [place], [place] [address]    Culture  Setup Time   Final    AEROBIC BOTTLE ONLY GRAM POSITIVE COCCI Gram Stain Report Called to,Read Back By and Verified With: JUDY BELL @1640  ON 06/27/23 C VARNER CRITICAL RESULT CALLED TO, READ BACK BY AND VERIFIED WITH: Perlie Mayo RN 06/28/2023 @ 0038 BY AB    Culture (A)  Final    STAPHYLOCOCCUS HAEMOLYTICUS THE SIGNIFICANCE OF ISOLATING THIS ORGANISM FROM A SINGLE SET OF BLOOD CULTURES WHEN MULTIPLE SETS ARE DRAWN IS UNCERTAIN. PLEASE NOTIFY THE MICROBIOLOGY DEPARTMENT WITHIN ONE WEEK IF SPECIATION AND  SENSITIVITIES ARE REQUIRED. Performed at [facility], [address], [place], [place] [address]    Report Status 06/28/2023 FINAL  Final  Blood Culture ID Panel (Reflexed)     Status: Abnormal   Collection Time: 06/26/23  2:03 PM  Result Value Ref Range Status   Enterococcus faecalis NOT DETECTED NOT DETECTED Final   Enterococcus Faecium NOT DETECTED NOT DETECTED Final   Listeria monocytogenes NOT DETECTED NOT DETECTED Final   Staphylococcus species DETECTED (A) NOT DETECTED Final    Comment: CRITICAL RESULT CALLED TO, READ BACK BY AND VERIFIED WITH: Perlie Mayo RN 06/28/2023 @ 0038 BY AB    Staphylococcus aureus (BCID) NOT DETECTED NOT DETECTED Final   Staphylococcus epidermidis NOT DETECTED NOT DETECTED Final   Staphylococcus lugdunensis NOT DETECTED NOT DETECTED Final   Streptococcus species NOT DETECTED NOT DETECTED Final   Streptococcus agalactiae NOT DETECTED NOT DETECTED Final   Streptococcus pneumoniae NOT DETECTED NOT DETECTED Final   Streptococcus pyogenes NOT DETECTED NOT DETECTED Final   A.calcoaceticus-baumannii NOT DETECTED NOT DETECTED Final   Bacteroides fragilis NOT DETECTED NOT DETECTED Final   Enterobacterales NOT DETECTED NOT DETECTED Final   Enterobacter cloacae complex NOT DETECTED NOT DETECTED Final   Escherichia coli NOT DETECTED NOT DETECTED Final   Klebsiella aerogenes NOT DETECTED NOT DETECTED Final   Klebsiella oxytoca NOT DETECTED NOT DETECTED Final   Klebsiella pneumoniae NOT DETECTED NOT DETECTED Final   Proteus species NOT DETECTED NOT DETECTED Final   Salmonella species NOT DETECTED NOT DETECTED Final   Serratia marcescens NOT DETECTED NOT  DETECTED Final   Haemophilus influenzae NOT DETECTED NOT DETECTED Final   Neisseria meningitidis NOT DETECTED NOT DETECTED Final   Pseudomonas aeruginosa NOT DETECTED NOT DETECTED Final   Stenotrophomonas maltophilia NOT DETECTED NOT DETECTED Final   Candida albicans NOT DETECTED NOT DETECTED Final    Candida auris NOT DETECTED NOT DETECTED Final   Candida glabrata NOT DETECTED NOT DETECTED Final   Candida krusei NOT DETECTED NOT DETECTED Final   Candida parapsilosis NOT DETECTED NOT DETECTED Final   Candida tropicalis NOT DETECTED NOT DETECTED Final   Cryptococcus neoformans/gattii NOT DETECTED NOT DETECTED Final    Comment: Performed at [facility], [address] [place], [place] [address]  Blood Culture (routine x 2)     Status: None   Collection Time: 06/26/23  2:53 PM   Specimen: BLOOD  Result Value Ref Range Status   Specimen Description BLOOD rt wrist  Final   Special Requests   Final    BOTTLES DRAWN AEROBIC AND ANAEROBIC Blood Culture adequate volume   Culture   Final    NO GROWTH 5 DAYS Performed at [facility], [address], [place], [place] [address]    Report Status 07/01/2023 FINAL  Final  Resp panel by RT-PCR (RSV, Flu A&B, Covid)     Status: None   Collection Time: 06/26/23  3:50 PM  Result Value Ref Range Status   SARS Coronavirus 2 by RT PCR NEGATIVE NEGATIVE Final    Comment: (NOTE) SARS-CoV-2 target nucleic acids are NOT DETECTED.  The SARS-CoV-2 RNA is generally detectable in upper respiratory specimens during the acute phase of infection. The lowest concentration of SARS-CoV-2 viral copies this assay can detect is 138 copies/mL. A negative result does not preclude SARS-Cov-2 infection and should not be used as the sole basis for treatment or other patient management decisions. A negative result may occur with  improper specimen collection/handling, submission of specimen other than nasopharyngeal swab, presence of viral mutation(s) within the areas targeted by this assay, and inadequate number of viral copies(<138 copies/mL). A negative result must be combined with clinical observations, patient history, and epidemiological information. The expected result is Negative.  Fact Sheet for Patients:   BloggerCourse.com  Fact Sheet for Healthcare Providers:  SeriousBroker.it  This test is no t yet approved or cleared by the [place] FDA and  has been authorized for detection and/or diagnosis of SARS-CoV-2 by FDA under an Emergency Use Authorization (EUA). This EUA will remain  in effect (meaning this test can be used) for the duration of the COVID-19 declaration under Section 564(b)(1) of the Act, 21 U.S.C.section 360bbb-3(b)(1), unless the authorization is terminated  or revoked sooner.       Influenza A by PCR NEGATIVE NEGATIVE Final   Influenza B by PCR NEGATIVE NEGATIVE Final    Comment: (NOTE) The Xpert Xpress SARS-CoV-2/FLU/RSV plus assay is intended as an aid in the diagnosis of influenza from Nasopharyngeal swab specimens and should not be used as a sole basis for treatment. Nasal washings and aspirates are unacceptable for Xpert Xpress SARS-CoV-2/FLU/RSV testing.  Fact Sheet for Patients: BloggerCourse.com  Fact Sheet for Healthcare Providers: SeriousBroker.it  This test is not yet approved or cleared by the [place] FDA and has been authorized for detection and/or diagnosis of SARS-CoV-2 by FDA under an Emergency Use Authorization (EUA). This EUA will remain in effect (meaning this test can be used) for the duration of the COVID-19 declaration under Section 564(b)(1) of the Act, 21 U.S.C.  section 360bbb-3(b)(1), unless the authorization is terminated or revoked.     Resp Syncytial Virus by PCR NEGATIVE NEGATIVE Final    Comment: (NOTE) Fact Sheet for Patients: BloggerCourse.com  Fact Sheet for Healthcare Providers: SeriousBroker.it  This test is not yet approved or cleared by the [place] FDA and has been authorized for detection and/or diagnosis of SARS-CoV-2 by FDA under an Emergency Use  Authorization (EUA). This EUA will remain in effect (meaning this test can be used) for the duration of the COVID-19 declaration under Section 564(b)(1) of the Act, 21 U.S.C. section 360bbb-3(b)(1), unless the authorization is terminated or revoked.  Performed at [facility], [address], [place], [place] [address]   Urine Culture     Status: Abnormal   Collection Time: 06/26/23  5:12 PM   Specimen: Urine, Catheterized  Result Value Ref Range Status   Specimen Description   Final    URINE, CATHETERIZED Performed at [facility], [address], [place], [place] [address]    Special Requests   Final    NONE Reflexed from [id number] Performed at [facility], [address], [place], [place] [address]    Culture 70,000 COLONIES/mL PSEUDOMONAS AERUGINOSA (A)  Final   Report Status 06/29/2023 FINAL  Final   Organism ID, Bacteria PSEUDOMONAS AERUGINOSA (A)  Final      Susceptibility   Pseudomonas aeruginosa - MIC*    CEFTAZIDIME 4 SENSITIVE Sensitive     CIPROFLOXACIN 0.5 SENSITIVE Sensitive     GENTAMICIN <=1 SENSITIVE Sensitive     IMIPENEM 1 SENSITIVE Sensitive     CEFEPIME 4 SENSITIVE Sensitive     * 70,000 COLONIES/mL PSEUDOMONAS AERUGINOSA  MRSA Next Gen by PCR, Nasal     Status: None   Collection Time: 06/26/23 10:14 PM   Specimen: Nasal Mucosa; Nasal Swab  Result Value Ref Range Status   MRSA by PCR Next Gen NOT DETECTED NOT DETECTED Final    Comment: (NOTE) The GeneXpert MRSA Assay (FDA approved for NASAL specimens only), is one component of a comprehensive MRSA colonization surveillance program. It is not intended to diagnose MRSA infection nor to guide or monitor treatment for MRSA infections. Test performance is not FDA approved in patients less than 2 years old. Performed at [facility], [address] [place], [place] [address]     Imaging reviewed:  CT ABDOMEN PELVIS W CONTRAST Result Date: 08/31/2023 CLINICAL DATA:  Metastatic pancreatic  adenocarcinoma.  Jaundice. * Tracking Code: BO * EXAM: CT ABDOMEN AND PELVIS WITH CONTRAST TECHNIQUE: Multidetector CT imaging of the abdomen and pelvis was performed using the standard protocol following bolus administration of intravenous contrast. RADIATION DOSE REDUCTION: This exam was performed according to the departmental dose-optimization program which includes automated exposure control, adjustment of the mA and/or kV according to patient size and/or use of iterative reconstruction technique. CONTRAST:  75mL OMNIPAQUE IOHEXOL 300 MG/ML  SOLN COMPARISON:  PET-CT 08/08/2023 FINDINGS: Lower chest: Coronary artery atheromatous vascular calcification. Small type 1 hiatal hernia. Mild atelectasis in both lower lobes. Hepatobiliary: Hypodense liver lesions compatible with biopsy-proven metastatic adenocarcinoma. Indistinctly marginated lesion inferiorly in the right hepatic lobe measures 2.6 by 1.8 cm on image 38 series 2. Multiple other lesions are present. Currently no there is no extrahepatic biliary dilatation, although mild intrahepatic biliary dilatation is suggested in segment 8. Gallbladder unremarkable. Pancreas: Infiltrative mass of the pancreatic body and adjacent tail, also extending around adjacent vascular structures including the splenic artery. Splenic vein poorly seen, presumably  thrombosed. 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 The SMV appears patent below the level of the mass. Because of its infiltrative nature of the mass is difficult to measure but is approximately 5.5 by 4.8 cm on image 37 series 2. Spleen: Subtle splenomegaly. There is some mild heterogeneity medially in the spleen, although there is no abnormal hypermetabolic activity in this vicinity on prior PET-CT and this may be an incidental finding. Adrenals/Urinary Tract: Hazy infiltration from the pancreatic mass abuts the medial limb of the left adrenal gland. Foley catheter in place. Kidneys  unremarkable. Stomach/Bowel: Redundant sigmoid colon. Borderline wall thickening in the rectum. No dilated bowel. Prominent stool throughout the colon favors constipation. Vascular/Lymphatic: Atherosclerosis is present, including aortoiliac atherosclerotic disease. Reproductive: Unremarkable Other: Trace free pelvic fluid, nonspecific. Musculoskeletal: Mild levoconvex lumbar scoliosis. Lumbar spondylosis and degenerative disc disease. Bilateral degenerative hip arthropathy. IMPRESSION: 1. Infiltrative mass of the pancreatic body and adjacent tail, also extending around adjacent vascular structures including the splenic artery. Splenic vein poorly seen, presumably thrombosed. 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 The SMV appears patent below the level of the mass. 2. Multiple hypodense liver lesions compatible with biopsy-proven metastatic adenocarcinoma. Currently no there is no extrahepatic biliary dilatation, although mild intrahepatic biliary dilatation is suggested in segment 8. 3. Subtle splenomegaly. There is some mild heterogeneity medially in the spleen, although there is no abnormal hypermetabolic activity in this vicinity on prior PET-CT and this may be an incidental finding. 4. Prominent stool throughout the colon favors constipation. 5. Trace free pelvic fluid, nonspecific. 6. Small type 1 hiatal hernia. 7. Coronary artery atheromatous vascular calcification. 8. Mild levoconvex lumbar scoliosis. Lumbar spondylosis and degenerative disc disease. 9. Bilateral degenerative hip arthropathy. 10.  Aortic Atherosclerosis (ICD10-I70.0). Electronically Signed   By: Gaylyn Rong M.D.   On: 08/31/2023 19:14   Pathology:  2/10 Liver mass Bx  FINAL MICROSCOPIC DIAGNOSIS:   A. LIVER, MASS, NEEDLE CORE BIOPSY:  Metastatic moderate to poorly differentiated adenocarcinoma  morphologically compatible with pancreatic primary    ED Course:  Noted to be hypotensive  80/50s -> improved with 1 L IVF. Transferred from [facility] ED due to finding of jaundice.  [facility] Dr. Bosie Clos had been consulted at the outside hospital to see in the morning.   Assessment/Plan:  73 y.o. 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   Obstructive jaundice, intrahepatic cholestasis  Acute liver injury, predominately cholestatic pattern Hx metastatic pancreatic adenocarcinoma, mets to liver   Hx acute onset of dark urine and jaundice, T bili 10.2, Direct 6.2; other LFT AST 175, ALT 348, alk phos 1207. On imaging he has multiple hypodense liver lesions which are bx proven mets, and mild intrahepatic biliary dilation in segment 8; no extrahepatic dilation is seen. There was some concern for AE of his recent Fosfomycin although I think much less likely medication related.  [facility] had been consulted at [facility] re: hyperbilirubinemia, may not have much to offer, if they agree intrahepatic cholestasis would be IR for management.  -IR order for PTC, n.p.o. in case could be done over the weekend -Routine oncology consult in the morning, had not been established but was scheduled with  Dr. Arlana Pouch on 2/17. Please contact to be see in the AM, did not contact overnight.  -I discussed recent pathology result with patient and family of confirmed liver metastases. I discussed this indicates that the cancer is stage IV and that although difficult to delinate that his pancreatic mass seems to be enlarging as well. I discussed that generally at this stage treatment is not curative in intent. I shared that his prognosis appears to be limited but did not engage in specifics and  defer to oncology.  -Although he does not currently meet criteria for cholangitis feel that he is very high risk and if PTC is to be delayed prefer to have antibiotics on board; start Ceftriaxone 2g IV q 24 hr, Flagyl 500 mg IV q 12 hr  -Symptomatic management for cancer-related pain oxycodone 2.5/5 mg every 6 hours as needed for moderate/severe, Dilaudid 0.5 mg IV every 4 hours as needed for breakthrough.  Zofran as needed for nausea  Hypotension, hypovolemic - resolved  Initial ED evaluation hypotensive in the 80s over 50s, improved to 100s over 50s with IV fluid.  Hx decreased intake N/V, likely hypovolemic. - Given additional 1 L IV fluid.  Encephalopathy, metabolic  Hx few days of decreased interaction with family although awake and alert. On initial evaluation he is mostly oriented other than the situation and to the month. Suspect this is metabolic from his liver injury, possibly there may be hyperammonemia as well. Also has borderline severe range hyponatremia which may contribute.  -Check ammonia can add lactulose if high, Management directed at liver injury above -Na management below   Acute kidney injury stage I CKD stage III B Metabolic acidosis, borderline anion gap.  Baseline creatinine is 1.6, elevated to 2.1 on admission. Bicarb 17, AG 13. Lactate 1.3. Suspect prerenal with decreased intake. Has foley in place.  -IV fluids per above -Renal dosing of medications  Hyponatremia, moderate, chronic/unknown onset Na corrected for glucose 122. He has encephalopathy although favor more from his liver injury. Etiology unclear at this point, has definite hypovolemia.  -Add on serum osm, check urine osm, urine sodium -Repeat BMP now, would follow at least q 6 hr for now until stabilized   Goals of care:  See above and pancreatic cancer I have discussed the biopsy result and that he has what appears to be stage IV cancer and that generally at this stage treatment is not curative in  intent.  Unsure that he has a limited prognosis but did not share details in defer to oncology.  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 At this point family is processing the news, family would like him to be Full code for now, although they shared that he would never want to be maintained on a ventilator. My recommendation would be DNR/DNI although I did not share with family at time of my interview since they are still processing the news.  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 Would stop statin at discharge due to  his liver injury.  Diabetes, with hyperglycemia: Home insulin regimen is glargine 65 units which she takes in the morning, Ozempic weekly.  Reduces basal insulin to Semglee 50 units, start SSI for sensitive, at bedtime correction while inpatient.  Would permanently discontinue his Ozempic considering his advanced pancreatic cancer and GI symptoms.  Hypertension: Recent hypotension, hold his home isosorbide mononitrate and olmesartan Hypothyroidism: Continues on levothyroxine BPH: Continue home finasteride. Hold his home Silodosin.  Chronic Foley: Routine Foley care Chronic anemia, hx B12, IDA: Continue home B12 injection monthly, home iron.  Mood d/o: Continue his home Xanax at bedtime prn   Body mass index is 24.2 kg/m.    DVT prophylaxis:  SCDs Code Status:  Full  Code Diet:  Diet Orders (From admission, onward)     Start     Ordered   08/31/23 2256  Diet NPO time specified Except for: Sips with Meds, Ice Chips  Diet effective now       Question Answer Comment  Except for Sips with Meds   Except for Ice Chips      08/31/23 2258           Family Communication:  Yes discussed with daughter and wife at the bedside   Consults:  GI, IR   Admission status:   Inpatient, Step Down Unit  Severity of Illness: The appropriate patient status for this patient is INPATIENT. Inpatient status is judged to be reasonable and necessary in order to provide the required intensity of service to ensure the patient's safety. The patient's presenting symptoms, physical exam findings, and initial radiographic and laboratory data in the context of their chronic comorbidities is felt to place them at high risk for further clinical deterioration. Furthermore, it is not anticipated that the patient will be medically stable for discharge from the hospital within 2 midnights of admission.   * I certify that at the point of admission it is my clinical judgment that the patient will require inpatient hospital care spanning beyond 2 midnights from the point of admission due to high intensity of service, high risk for further deterioration and high frequency of surveillance required.*   Dolly Rias, MD [facility]  How to contact the [facility] Attending or Consulting provider 7A - 7P or covering provider during after hours 7P -7A, for this patient.  Check the care team in [facility] and look for a) attending/consulting [facility] provider listed and b) the [facility] team listed Log into [email or web address] and use [place]'s universal password to access. If you do not have the password, please contact the hospital operator. Locate the [facility] provider you are looking for under [facility] and page to a number that you can be directly reached. If you still have difficulty reaching the  provider, please page the [facility] (Director on Call) for the Hospitalists listed on amion for assistance.  09/01/2023, 12:20 AM

## 2023-09-01 NOTE — Consult Note (Addendum)
[facility] Gastroenterology Consult  Referring Provider: No ref. provider found Primary Care Physician:  Bennie Pierini, FNP Primary Gastroenterologist: [facility]-Dr[facility]  Reason for Consultation: Abnormal liver enzymes  SUBJECTIVE:   HPI: Jacob Fowler is a 73 y.o. 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  Presented to hospital at recommendation from nursing staff for jaundice.  Family noted that patient has been lethargic.  On my evaluation of patient, multiple family members in room including his daughter and spouse.  Patient lethargic and history mostly obtained from daughter and spouse.  Patient awoken to spoken name and soft sternal rub, denied any abdominal pain, chest pain or shortness of breath.  Liver enzymes on presentation showed alkaline phosphatase 1207, total bilirubin 10.2, AST/ALT 175/348.  Has comparison, liver enzymes on 08/07/2023 showed alkaline phosphatase 203, total bilirubin 0.4, AST/ALT 23/35.  WBC 11.5, hemoglobin 10.8, platelet 168.  CT imaging on 08/31/2023 showed infiltrative mass in the pancreas body/tail 5.5 x 4.8 cm, no extrahepatic biliary dilatation, mild intrahepatic biliary dilatation in segment 8, gallbladder unremarkable, very narrow/possible occluded lower portal vein, prominent stool.  MRCP on 07/24/2023 showed pancreatic mass size 3.87 x 3.0 x 2.7 cm.  Colonoscopy 12/31/2017 for personal history of colon polyps-Dr. Magod-mid descending colon polyp x 2 status post biopsy.  Past Medical History:  Diagnosis Date   Acquired leg length discrepancy    Bladder stone    BPH associated with nocturia    CAD (coronary artery disease) CARDIOLOGIST- DR HOCHREIN--  LAST VISIT 09-27-2011 IN EPIC    Non obstructive Cath 2010-  HX CORONARY SPASM 2004   Cataract    bilateral    GERD (gastroesophageal reflux disease)    H/O hiatal hernia    Heart murmur    Heartburn    High cholesterol    History of nephrolithiasis 2009   Hx of adenomatous colonic polyps    Hyperlipidemia    Hypertension    Movement disorder    Multiple sclerosis (HCC) DX  10/10---  NEUROLOGIST  DR Gerlene Burdock [place] ([place])   RIGHT SIDE AFFECTED MORE W/ WEAKNESS- - USES CANE   Nephrolithiasis 08/30/2020   Neuromuscular disorder (HCC)    MS   NIDDM (non-insulin dependent diabetes mellitus)    Peyronie's disease    RBBB    Renal stone RIGHT   Rosacea    Vision abnormalities    Past Surgical History:  Procedure Laterality Date   CARDIAC CATHETERIZATION  02-26-2009 ---  DR Julien Nordmann   NONOBSTRUCTIVE CAD/ 50% DISTAL RCA/ 30% LAD / NORMAL LVF   CYSTOSCOPY W/ RETROGRADES  10/30/2011   Procedure: CYSTOSCOPY WITH RETROGRADE PYELOGRAM;  Surgeon: Garnett Farm, MD;  Location: [facility];  Service: Urology;  Laterality: Right;   CYSTOSCOPY WITH LITHOLAPAXY  10/30/2011   Procedure: CYSTOSCOPY WITH LITHOLAPAXY;  Surgeon: Garnett Farm, MD;  Location: [facility];  Service: Urology;  Laterality: N/A;   CYSTOSCOPY WITH RETROGRADE PYELOGRAM, URETEROSCOPY AND STENT PLACEMENT Left 11/25/2020   Procedure: CYSTOSCOPY WITH LEFT RETROGRADE PYELOGRAM, LEFT URETEROSCOPY WITH LASER AND LEFT URETERAL STENT PLACEMENT;  Surgeon: Malen Gauze, MD;  Location: [facility];  Service: Urology;  Laterality: Left;   EXTRACORPOREAL SHOCK WAVE LITHOTRIPSY  09/05/2010   RIGHT   EYE SURGERY Right 08/30/2018   HOLMIUM LASER APPLICATION Left 11/25/2020   Procedure:  HOLMIUM LASER APPLICATION;  Surgeon: Malen Gauze, MD;  Location: [facility];  Service: Urology;  Laterality: Left;   NESBIT PROCEDURE  10/17/2004   CORRECTION OF PENILE ANGULATION (PEYRONIES DISEASE)   UTI  06/17/2022   Prior to Admission  medications   Medication Sig Start Date End Date Taking? Authorizing Provider  acetaminophen (TYLENOL) 325 MG tablet Take 2 tablets (650 mg total) by mouth every 6 (six) hours as needed for mild pain (pain score 1-3) (or Fever >/= 101). Patient taking differently: Take 325-650 mg by mouth every 6 (six) hours as needed for mild pain (pain score 1-3) (or Fever >/= 101). 06/29/23  Yes Shon Hale, MD  ALPRAZolam Prudy Feeler) 0.5 MG tablet Take 1 tablet (0.5 mg total) by mouth at bedtime as needed for anxiety. Patient taking differently: Take 0.25 mg by mouth daily as needed for anxiety. 08/07/23  Yes Pasam, Avinash, MD  Ascorbic Acid (VITAMIN C PO) Take 500 mg by mouth in the morning and at bedtime.   Yes [provider]  aspirin 81 MG EC tablet Take 81 mg by mouth daily.   Yes [provider]  Cholecalciferol (VITAMIN D) 50 MCG (2000 UT) tablet Take 2,000 Units by mouth daily.   Yes [provider]  cyanocobalamin (VITAMIN B12) 1000 MCG/ML injection INJECT INTO THE SKIN ONCE A MONTH 11/01/22  Yes Daphine Deutscher, Mary-Margaret, FNP  ferrous sulfate 325 (65 FE) MG tablet Take 1 tablet (325 mg total) by mouth daily with breakfast. 07/30/23  Yes Daphine Deutscher, Mary-Margaret, FNP  finasteride (PROSCAR) 5 MG tablet Take 1 tablet (5 mg total) by mouth daily. 01/26/23  Yes Martin, Mary-Margaret, FNP  insulin glargine (LANTUS SOLOSTAR) 100 UNIT/ML Solostar Pen Inject 65 Units into the skin daily. 07/30/23  Yes Daphine Deutscher, Mary-Margaret, FNP  isosorbide mononitrate (IMDUR) 60 MG 24 hr tablet TAKE 1/2 TABLET (30MG  TOTAL) BY MOUTH DAILY 07/30/23  Yes Daphine Deutscher, Mary-Margaret, FNP  levothyroxine (SYNTHROID) 88 MCG tablet Take 1 tablet (88 mcg total) by mouth daily. 04/17/23  Yes Martin, Mary-Margaret, FNP  liver oil-zinc oxide (DESITIN) 40 % ointment Apply topically 2 (two) times daily. Apply Thin layer of Desitin  (Zinc Oxide) 2 times daily and as additional application needed for soiling. Patient taking  differently: Apply 1 Application topically as needed for irritation. 06/29/23  Yes Emokpae, Courage, MD  magnesium oxide (MAG-OX) 400 MG tablet Take 1 tablet by mouth daily.   Yes [provider]  nitrofurantoin (MACRODANTIN) 50 MG capsule Take 50 mg by mouth at bedtime. 07/30/23  Yes [provider]  olmesartan (BENICAR) 5 MG tablet Take 1 tablet (5 mg total) by mouth daily. 07/30/23 07/29/24 Yes Martin, Mary-Margaret, FNP  omeprazole (PRILOSEC) 20 MG capsule Take 1 capsule (20 mg total) by mouth daily. 07/30/23  Yes Daphine Deutscher, Mary-Margaret, FNP  ondansetron (ZOFRAN) 4 MG tablet Take 1 tablet (4 mg total) by mouth every 8 (eight) hours as needed for nausea or vomiting. 08/06/23  Yes Daphine Deutscher, Mary-Margaret, FNP  rosuvastatin (CRESTOR) 10 MG tablet Take 1 tablet (10 mg total) by mouth daily. 04/16/23  Yes Martin, Mary-Margaret, FNP  Semaglutide,0.25 or 0.5MG /DOS, (OZEMPIC, 0.25 OR 0.5 MG/DOSE,) 2 MG/3ML SOPN Inject 0.25 mg into the skin once a week. 07/30/23  Yes Daphine Deutscher, Mary-Margaret, FNP  silodosin (RAPAFLO) 8 MG CAPS capsule Take 1 capsule (8 mg total) by mouth at bedtime. 04/16/23  Yes Martin, Mary-Margaret, FNP  sodium bicarbonate 650 MG tablet Take 650 mg by mouth 2 (two) times daily.   Yes [provider]  sodium citrate-citric acid (ORACIT) 500-334 MG/5ML solution Take 15 mLs by mouth 2 (two) times daily. 02/06/23  Yes [provider]  SPS, SODIUM POLYSTYRENE SULF, 15 GM/60ML suspension Take 15 mLs by mouth 2 (two) times a week. Tuesdays and Thursdays 06/11/23  Yes [provider]  fosfomycin (MONUROL) 3 g PACK Take by mouth. Patient not taking: Reported on 09/01/2023 08/28/23   [provider]  glucose blood test strip Use as instructed 07/06/14   Ernestina Penna, MD  Insulin Pen Needle 31G X 5 MM MISC Use with lantus pen as directed 04/16/23   Daphine Deutscher, Mary-Margaret, FNP  nystatin (MYCOSTATIN/NYSTOP) powder Apply 1 Application topically 3 (three) times  daily. Patient not taking: Reported on 09/01/2023 08/23/23   Bennie Pierini, FNP  sulfamethoxazole-trimethoprim (BACTRIM DS) 800-160 MG tablet Take 1 tablet by mouth 2 (two) times daily. Patient not taking: Reported on 09/01/2023 08/23/23   Bennie Pierini, FNP  atorvastatin (LIPITOR) 10 MG tablet Take 10 mg by mouth daily.  09/27/11  [provider]  niacin-simvastatin (SIMCOR) 500-20 MG 24 hr tablet Take 1 tablet by mouth at bedtime.    09/27/11  [provider]   Current Facility-Administered Medications  Medication Dose Route Frequency Provider Last Rate Last Admin   ALPRAZolam Prudy Feeler) tablet 0.5 mg  0.5 mg Oral QHS PRN Segars, Christiane Ha, MD       cefTRIAXone (ROCEPHIN) 2 g in sodium chloride 0.9 % 100 mL IVPB  2 g Intravenous Q24H Dolly Rias, MD 200 mL/hr at 09/01/23 0206 2 g at 09/01/23 0206   Chlorhexidine Gluconate Cloth 2 % PADS 6 each  6 each Topical Daily Segars, Christiane Ha, MD       dextrose 5 %-0.9 % sodium chloride infusion   Intravenous Continuous Anthoney Harada, NP 40 mL/hr at 09/01/23 0630 New Bag at 09/01/23 0630   ferrous sulfate tablet 325 mg  325 mg Oral Q breakfast Dolly Rias, MD   325 mg at 09/01/23 1016   finasteride (PROSCAR) tablet 5 mg  5 mg Oral Daily Segars, Christiane Ha, MD   5 mg at 09/01/23 1015   HYDROmorphone (DILAUDID) injection 0.5 mg  0.5 mg Intravenous Q4H PRN Dolly Rias, MD       insulin aspart (novoLOG) injection 0-5 Units  0-5 Units Subcutaneous QHS Segars, Christiane Ha, MD       insulin aspart (novoLOG) injection 0-9 Units  0-9 Units Subcutaneous TID WC Dolly Rias, MD   1 Units at 09/01/23 0811   insulin glargine-yfgn (SEMGLEE) injection 50 Units  50 Units Subcutaneous Daily Dolly Rias, MD   50 Units at 09/01/23 1011   levothyroxine (SYNTHROID) tablet 88 mcg  88 mcg Oral Daily Dolly Rias, MD   88 mcg at 09/01/23 1610   liver oil-zinc oxide (DESITIN) 40 % ointment   Topical Daily Segars, Christiane Ha, MD        melatonin tablet 6 mg  6 mg Oral QHS PRN Dolly Rias, MD       metroNIDAZOLE (FLAGYL) IVPB 500 mg  500 mg Intravenous Domenica Fail, MD 100 mL/hr at 09/01/23 0242 500 mg at 09/01/23 0242   ondansetron (ZOFRAN) injection 4 mg  4 mg Intravenous Q6H PRN Dolly Rias, MD       oxyCODONE (Oxy IR/ROXICODONE) immediate release tablet 2.5 mg  2.5 mg Oral Q6H PRN Dolly Rias, MD   2.5 mg at 08/31/23 2348   Or   oxyCODONE (Oxy IR/ROXICODONE) immediate release tablet 5 mg  5 mg Oral  Q6H PRN Dolly Rias, MD       pantoprazole (PROTONIX) EC tablet 40 mg  40 mg Oral Daily Segars, Christiane Ha, MD   40 mg at 09/01/23 1015   polyethylene glycol (MIRALAX / GLYCOLAX) packet 17 g  17 g Oral Daily PRN Dolly Rias, MD       sodium chloride flush (NS) 0.9 % injection 3 mL  3 mL Intravenous Q12H Dolly Rias, MD   3 mL at 08/31/23 2352   Allergies as of 08/31/2023 - Review Complete 08/31/2023  Allergen Reaction Noted   Farxiga [dapagliflozin] Other (See Comments) 05/10/2022   Chauncey Mann [finerenone] Other (See Comments) 05/10/2022   Family History  Problem Relation Age of Onset   Stroke Mother    Heart attack Mother    Diabetes Mother    Hypertension Mother    Renal cancer Father    Diabetes Sister    Heart attack Brother    Diabetes Son    Heart attack Maternal Grandfather    Heart attack Brother    Bladder Cancer Brother    Social History   Socioeconomic History   Marital status: Married    Spouse name: Steward Drone   Number of children: 2   Years of education: 13   Highest education level: Tax adviser degree: occupational, Scientist, product/process development, or vocational program  Occupational History   Occupation: retired    Comment: self -employed farmer  Tobacco Use   Smoking status: Former    Current packs/day: 0.00    Average packs/day: 1 pack/day for 7.0 years (7.0 ttl pk-yrs)    Types: Cigarettes    Start date: 09/27/1966    Quit date: 09/26/1973    Years since quitting: 49.9    Smokeless tobacco: Current    Types: Chew   Tobacco comments:    Chewing tobacco  Vaping Use   Vaping status: Never Used  Substance and Sexual Activity   Alcohol use: No    Alcohol/week: 0.0 standard drinks of alcohol   Drug use: No   Sexual activity: Not Currently  Other Topics Concern   Not on file  Social History Narrative   Lives with wife, has MS, mobility issues.   Social Drivers of Corporate investment banker Strain: Low Risk  (08/27/2023)   Overall Financial Resource Strain (CARDIA)    Difficulty of Paying Living Expenses: Not hard at all  Food Insecurity: No Food Insecurity (08/31/2023)   Hunger Vital Sign    Worried About Running Out of Food in the Last Year: Never true    Ran Out of Food in the Last Year: Never true  Transportation Needs: No Transportation Needs (08/31/2023)   PRAPARE - Administrator, Civil Service (Medical): No    Lack of Transportation (Non-Medical): No  Physical Activity: Insufficiently Active (08/27/2023)   Exercise Vital Sign    Days of Exercise per Week: 3 days    Minutes of Exercise per Session: 40 min  Stress: No Stress Concern Present (08/27/2023)   [employer] of Occupational Health - Occupational Stress Questionnaire    Feeling of Stress : Not at all  Social Connections: Moderately Isolated (08/31/2023)   Social Connection and Isolation Panel [NHANES]    Frequency of Communication with Friends and Family: Three times a week    Frequency of Social Gatherings with Friends and Family: Twice a week    Attends Religious Services: Never    Database administrator or Organizations: No    Attends Banker Meetings:  Never    Marital Status: Married  Catering manager Violence: Not At Risk (08/31/2023)   Humiliation, Afraid, Rape, and Kick questionnaire    Fear of Current or Ex-Partner: No    Emotionally Abused: No    Physically Abused: No    Sexually Abused: No   Review of Systems:  Review of Systems  Respiratory:   Negative for shortness of breath.   Cardiovascular:  Negative for chest pain.  Gastrointestinal:  Negative for abdominal pain.    OBJECTIVE:   Temp:  [97.5 F (36.4 C)-98.5 F (36.9 C)] 97.5 F (36.4 C) (02/15 1037) Pulse Rate:  [71-95] 82 (02/15 1037) Resp:  [12-20] 15 (02/15 1037) BP: (87-128)/(52-71) 96/69 (02/15 1037) SpO2:  [94 %-99 %] 98 % (02/15 1037) Weight:  [68 kg] 68 kg (02/14 1444) Last BM Date : 08/30/23 Physical Exam Constitutional:      General: He is not in acute distress.    Appearance: He is not ill-appearing, toxic-appearing or diaphoretic.  Eyes:     General: Scleral icterus present.  Cardiovascular:     Rate and Rhythm: Normal rate and regular rhythm.  Pulmonary:     Effort: No respiratory distress.     Breath sounds: Normal breath sounds.  Abdominal:     General: Bowel sounds are normal. There is no distension.     Palpations: Abdomen is soft.     Tenderness: There is no abdominal tenderness. There is no guarding.  Skin:    General: Skin is warm and dry.     Coloration: Skin is jaundiced.  Neurological:     Mental Status: He is alert.     Labs: Recent Labs    08/31/23 1501 09/01/23 0147  WBC 11.5* 10.1  HGB 10.8* 10.5*  HCT 31.5* 32.9*  PLT 168 159   BMET Recent Labs    08/31/23 1501 09/01/23 0147  NA 120* 129*  K 4.5 4.3  CL 90* 100  CO2 17* 18*  GLUCOSE 227* 67*  BUN 41* 35*  CREATININE 2.18* 1.73*  CALCIUM 9.2 9.1   LFT Recent Labs    09/01/23 0147  PROT 6.6  ALBUMIN 2.8*  AST 148*  ALT 292*  ALKPHOS 1,066*  BILITOT 10.2*  BILIDIR 6.7*  IBILI 3.5*   PT/INR Recent Labs    08/31/23 1508  LABPROT 14.2  INR 1.1    Diagnostic imaging: CT ABDOMEN PELVIS W CONTRAST Result Date: 08/31/2023 CLINICAL DATA:  Metastatic pancreatic adenocarcinoma.  Jaundice. * Tracking Code: BO * EXAM: CT ABDOMEN AND PELVIS WITH CONTRAST TECHNIQUE: Multidetector CT imaging of the abdomen and pelvis was performed using the standard  protocol following bolus administration of intravenous contrast. RADIATION DOSE REDUCTION: This exam was performed according to the departmental dose-optimization program which includes automated exposure control, adjustment of the mA and/or kV according to patient size and/or use of iterative reconstruction technique. CONTRAST:  75mL OMNIPAQUE IOHEXOL 300 MG/ML  SOLN COMPARISON:  PET-CT 08/08/2023 FINDINGS: Lower chest: Coronary artery atheromatous vascular calcification. Small type 1 hiatal hernia. Mild atelectasis in both lower lobes. Hepatobiliary: Hypodense liver lesions compatible with biopsy-proven metastatic adenocarcinoma. Indistinctly marginated lesion inferiorly in the right hepatic lobe measures 2.6 by 1.8 cm on image 38 series 2. Multiple other lesions are present. Currently no there is no extrahepatic biliary dilatation, although mild intrahepatic biliary dilatation is suggested in segment 8. Gallbladder unremarkable. Pancreas: Infiltrative mass of the pancreatic body and adjacent tail, also extending around adjacent vascular structures including the splenic artery. Splenic vein  poorly seen, presumably thrombosed. 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 The SMV appears patent below the level of the mass. Because of its infiltrative nature of the mass is difficult to measure but is approximately 5.5 by 4.8 cm on image 37 series 2. Spleen: Subtle splenomegaly. There is some mild heterogeneity medially in the spleen, although there is no abnormal hypermetabolic activity in this vicinity on prior PET-CT and this may be an incidental finding. Adrenals/Urinary Tract: Hazy infiltration from the pancreatic mass abuts the medial limb of the left adrenal gland. Foley catheter in place. Kidneys unremarkable. Stomach/Bowel: Redundant sigmoid colon. Borderline wall thickening in the rectum. No dilated bowel. Prominent stool throughout the colon favors constipation.  Vascular/Lymphatic: Atherosclerosis is present, including aortoiliac atherosclerotic disease. Reproductive: Unremarkable Other: Trace free pelvic fluid, nonspecific. Musculoskeletal: Mild levoconvex lumbar scoliosis. Lumbar spondylosis and degenerative disc disease. Bilateral degenerative hip arthropathy. IMPRESSION: 1. Infiltrative mass of the pancreatic body and adjacent tail, also extending around adjacent vascular structures including the splenic artery. Splenic vein poorly seen, presumably thrombosed. 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 The SMV appears patent below the level of the mass. 2. Multiple hypodense liver lesions compatible with biopsy-proven metastatic adenocarcinoma. Currently no there is no extrahepatic biliary dilatation, although mild intrahepatic biliary dilatation is suggested in segment 8. 3. Subtle splenomegaly. There is some mild heterogeneity medially in the spleen, although there is no abnormal hypermetabolic activity in this vicinity on prior PET-CT and this may be an incidental finding. 4. Prominent stool throughout the colon favors constipation. 5. Trace free pelvic fluid, nonspecific. 6. Small type 1 hiatal hernia. 7. Coronary artery atheromatous vascular calcification. 8. Mild levoconvex lumbar scoliosis. Lumbar spondylosis and degenerative disc disease. 9. Bilateral degenerative hip arthropathy. 10.  Aortic Atherosclerosis (ICD10-I70.0). Electronically Signed   By: Gaylyn Rong M.D.   On: 08/31/2023 19:14   IMPRESSION: Jaundice, scleral icterus Hyperbilirubinemia Transaminase elevation in mixed pattern Pancreatic cancer metastatic to liver Multiple sclerosis Hypertension Chronic indwelling Foley catheter  -Recent urinary tract infection treated with fosfomycin  PLAN: -No signs of biliary obstruction on CT imaging, though pancreas mass does appear to be increasing in size, for completeness could consider MRCP imaging, on  discussing MRCP with patient's family, they would prefer to trend liver enzymes first before proceeding with further testing -Suspect liver lesions largely driving hyperbilirubinemia (metastatic disease appears to be aggressive as CT imaging 08/31/2023 showed multiple liver lesions and CT imaging on 06/26/2023 showed unremarkable liver parenchyma) -Clinically doubt fosfomycin as cause of hyperbilirubinemia, though would monitor off therapy -Agree with interventional radiology evaluation for possible percutaneous drainage -Trend hepatic panel daily -Okay for diet from GI perspective -[facility] will follow   LOS: 1 day   Liliane Shi, DO [facility]  UPDATE: 2:46 PM -Discussed patient case with advanced endoscopy/biliary coverage, Dr. Jeani Hawking, no role for endoscopic intervention at this time -Received communication from Dr. Patterson Hammersmith with Palliative Care, code status has been changed to DNI/DNR and family does not want to pursue further aggressive workup/interventions -I have communicated this updated status with his primary gastroenterologist, Dr. Ewing Schlein -[facility] will be available as needed   Liliane Shi, DO [facility]

## 2023-09-01 NOTE — Consult Note (Signed)
[facility]  [phone]   HEMATOLOGY/ONCOLOGY IN-PATIENT CONSULTATION NOTE   PATIENT NAME: Jacob Fowler   MR#: [id number] DOB: 11/10/1950 CSN#: [id number]   DATE OF SERVICE: 09/01/2023  Requesting Physician: Eric [place], DO  Patient Care Team: Bennie Pierini, FNP as PCP - General (Family Medicine) Epimenio Foot, Pearletha Furl, MD (Neurology) Rollene Rotunda, MD as Consulting Physician (Cardiology) Delora Fuel, OD (Optometry) McKenzie, Mardene Celeste, MD as Consulting Physician (Urology) Ricky Stabs, RN as VBCI Care Management  REASON FOR CONSULTATION:  Management decisions in a patient with recently diagnosed 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  ASSESSMENT & PLAN:  Stage 4 pancreatic cancer with extensive liver metastatic disease -Please review oncology history for additional details and timeline of events. -When I saw him for initial consultation in our clinic on 08/07/2023, we submitted request for liver biopsy and for PET scan.  Biopsy of the liver lesion confirmed metastatic pancreatic cancer. -Discussed staging, prognosis, plan of care, treatment options.  All treatment options are palliative in nature given metastatic nature of the disease. 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 -It will be very challenging to treat him with systemic chemotherapy as risks outweigh benefits.  Patient and family members understand this. -They have decided to proceed with hospice.  This will be the best option for him.  Thank you for giving [place] an opportunity to participate in his care.  Please call us with any questions or concerns.   Meryl Crutch, MD Medical Oncology and Hematology 09/01/2023 6:47 PM   HISTORY OF PRESENT ILLNESS:  Jacob Fowler is a 73 y.o. 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  He was admitted to the hospital overnight after he was transferred from [facility] ED for jaundice.  Labs revealed: T bili 10.2, Direct 6.2; other LFT AST 175, ALT 348, alk phos 1207.    GI was consulted but given his overall poor prognosis, he was determined not to be a candidate for aggressive interventional measures.  We were consulted for any additional recommendations regarding his recently diagnosed stage IV pancreatic cancer.  Patient seen and evaluated.  His daughter, his wife and other family members were by the bedside.  They have already made the decision to proceed with hospice.  Patient is on board with this plan.  ONCOLOGY HISTORY:  Patient was hospitalized from 06/26/2023 until 06/29/2023 after he was brought in for increasing lethargy.   In the emergency room, he was found to have lactic acidosis, leukocytosis and UA was suggestive of UTI.  CT abdomen pelvis with contrast suggested cystitis but also incidentally showed 3.1 cm lesion in the inferior aspect of proximal pancreatic tail, concerning for pancreatic adenocarcinoma.   He was admitted to the hospital for metabolic encephalopathy related to sepsis from acute right-sided epididymoorchitis.  He was treated with antibiotics.  Blood cultures grew Pseudomonas aeruginosa and Klebsiella aerogenes.  He was eventually discharged with outpatient MRI abdomen recommendation.   He had MRI of the abdomen/MRCP on 07/24/2023.  It showed infiltrative mass of the pancreatic neck and body which effaced the pancreatic duct and encase the celiac axis, proximal branch vessels and portal confluence.  Mass measured 3.8 x 3 x 2.7 cm, findings  consistent with primary pancreatic  adenocarcinoma.  Also noted was numerous diffusion restricting, although ice enhancing metastasis throughout the liver, index lesion of the liver dome and hepatic segment VII measuring 1.6 x 1.5 cm.  Also noted was heterogeneously T2 hypointense lesion of the central spleen measuring 3.2 x 3 cm, most likely a hamartoma or other benign incidental finding.  Splenomegaly was noted.  Trace pleural effusions.   Given MRI findings, his PCP referred to [place] for further evaluation and management.   On his initial consultation in our clinic on 08/07/2023, referral sent to IR for biopsy of liver lesions.  Also requested PET/CT for staging.  On 08/08/2023, PET scan showed partially centrally necrotic infiltrating mass of the pancreatic body under distal tail measuring 5.9 x 4 cm with SUV of 6.5.  Numerous scattered hypermetabolic foci in the liver, compatible with hepatic metastatic disease.   Given his comorbidities and general performance status, he is not a candidate for aggressive systemic treatments.   MEDICAL HISTORY Past Medical History:  Diagnosis Date   Acquired leg length discrepancy    Bladder stone    BPH associated with nocturia    CAD (coronary artery disease) CARDIOLOGIST- DR HOCHREIN--  LAST VISIT 09-27-2011 IN EPIC   Non obstructive Cath 2010-  HX CORONARY SPASM 2004   Cataract    bilateral    GERD (gastroesophageal reflux disease)    H/O hiatal hernia    Heart murmur    Heartburn    High cholesterol    History of nephrolithiasis 2009   Hx of adenomatous colonic polyps    Hyperlipidemia    Hypertension    Movement disorder    Multiple sclerosis (HCC) DX  10/10---  NEUROLOGIST  DR Gerlene Burdock [place] ([place])   RIGHT SIDE AFFECTED MORE W/ WEAKNESS- - USES CANE   Nephrolithiasis 08/30/2020   Neuromuscular disorder (HCC)    MS   NIDDM (non-insulin dependent diabetes mellitus)    Peyronie's disease    RBBB    Renal stone RIGHT   Rosacea    Vision abnormalities      SURGICAL  HISTORY Past Surgical History:  Procedure Laterality Date   CARDIAC CATHETERIZATION  02-26-2009 ---  DR Julien Nordmann   NONOBSTRUCTIVE CAD/ 50% DISTAL RCA/ 30% LAD / NORMAL LVF   CYSTOSCOPY W/ RETROGRADES  10/30/2011   Procedure: CYSTOSCOPY WITH RETROGRADE PYELOGRAM;  Surgeon: Garnett Farm, MD;  Location: [facility];  Service: Urology;  Laterality: Right;   CYSTOSCOPY WITH LITHOLAPAXY  10/30/2011   Procedure: CYSTOSCOPY WITH LITHOLAPAXY;  Surgeon: Garnett Farm, MD;  Location: [facility];  Service: Urology;  Laterality: N/A;   CYSTOSCOPY WITH RETROGRADE PYELOGRAM, URETEROSCOPY AND STENT PLACEMENT Left 11/25/2020   Procedure: CYSTOSCOPY WITH LEFT RETROGRADE PYELOGRAM, LEFT URETEROSCOPY WITH LASER AND LEFT URETERAL STENT PLACEMENT;  Surgeon: Malen Gauze, MD;  Location: [facility];  Service: Urology;  Laterality: Left;   EXTRACORPOREAL SHOCK WAVE LITHOTRIPSY  09/05/2010   RIGHT   EYE SURGERY Right 08/30/2018   HOLMIUM LASER APPLICATION Left 11/25/2020   Procedure: HOLMIUM LASER APPLICATION;  Surgeon: Malen Gauze, MD;  Location: [facility];  Service: Urology;  Laterality: Left;   NESBIT PROCEDURE  10/17/2004   CORRECTION OF PENILE ANGULATION (PEYRONIES DISEASE)   UTI  06/17/2022     ALLERGIES  Allergies  Allergen Reactions   Farxiga [Dapagliflozin] Other (See Comments)    unable to take Farxiga due to recurrent UTIs, urinary retention and hydronephrosis  Chauncey Mann [Finerenone] Other (See Comments)    hyperkalemia    FAMILY HISTORY  Family History  Problem Relation Age of Onset   Stroke Mother    Heart attack Mother    Diabetes Mother    Hypertension Mother    Renal cancer Father    Diabetes Sister    Heart attack Brother    Diabetes Son    Heart attack Maternal Grandfather    Heart attack Brother    Bladder Cancer Brother      SOCIAL HISTORY   Social History   Socioeconomic History   Marital status: Married    Spouse name:  Steward Drone   Number of children: 2   Years of education: 13   Highest education level: Tax adviser degree: occupational, Scientist, product/process development, or vocational program  Occupational History   Occupation: retired    Comment: self -employed farmer  Tobacco Use   Smoking status: Former    Current packs/day: 0.00    Average packs/day: 1 pack/day for 7.0 years (7.0 ttl pk-yrs)    Types: Cigarettes    Start date: 09/27/1966    Quit date: 09/26/1973    Years since quitting: 49.9   Smokeless tobacco: Current    Types: Chew   Tobacco comments:    Chewing tobacco  Vaping Use   Vaping status: Never Used  Substance and Sexual Activity   Alcohol use: No    Alcohol/week: 0.0 standard drinks of alcohol   Drug use: No   Sexual activity: Not Currently  Other Topics Concern   Not on file  Social History Narrative   Lives with wife, has MS, mobility issues.   Social Drivers of Corporate investment banker Strain: Low Risk  (08/27/2023)   Overall Financial Resource Strain (CARDIA)    Difficulty of Paying Living Expenses: Not hard at all  Food Insecurity: No Food Insecurity (09/01/2023)   Hunger Vital Sign    Worried About Running Out of Food in the Last Year: Never true    Ran Out of Food in the Last Year: Never true  Transportation Needs: No Transportation Needs (09/01/2023)   PRAPARE - Administrator, Civil Service (Medical): No    Lack of Transportation (Non-Medical): No  Physical Activity: Insufficiently Active (08/27/2023)   Exercise Vital Sign    Days of Exercise per Week: 3 days    Minutes of Exercise per Session: 40 min  Stress: No Stress Concern Present (08/27/2023)   [employer] of Occupational Health - Occupational Stress Questionnaire    Feeling of Stress : Not at all  Social Connections: Moderately Isolated (08/31/2023)   Social Connection and Isolation Panel [NHANES]    Frequency of Communication with Friends and Family: Three times a week    Frequency of Social Gatherings with  Friends and Family: Twice a week    Attends Religious Services: Never    Database administrator or Organizations: No    Attends Banker Meetings: Never    Marital Status: Married  Catering manager Violence: Not At Risk (09/01/2023)   Humiliation, Afraid, Rape, and Kick questionnaire    Fear of Current or Ex-Partner: No    Emotionally Abused: No    Physically Abused: No    Sexually Abused: No    CURRENT MEDICATIONS   Current Outpatient Medications  Medication Instructions   acetaminophen (TYLENOL) 650 mg, Oral, Every 6 hours PRN   ALPRAZolam (XANAX) 0.5 mg, Oral, At bedtime PRN   Ascorbic Acid (VITAMIN C PO)  500 mg, Oral, 2 times daily   aspirin EC 81 mg, Oral, Daily   cyanocobalamin (VITAMIN B12) 1000 MCG/ML injection INJECT INTO THE SKIN ONCE A MONTH   ferrous sulfate 325 mg, Oral, Daily with breakfast   finasteride (PROSCAR) 5 mg, Oral, Daily   fosfomycin (MONUROL) 3 g PACK Take by mouth.   glucose blood test strip Use as instructed   Insulin Pen Needle 31G X 5 MM MISC Use with lantus pen as directed   isosorbide mononitrate (IMDUR) 60 MG 24 hr tablet TAKE 1/2 TABLET (30MG  TOTAL) BY MOUTH DAILY   Lantus SoloStar 65 Units, Subcutaneous, Daily   levothyroxine (SYNTHROID) 88 mcg, Oral, Daily   liver oil-zinc oxide (DESITIN) 40 % ointment Topical, 2 times daily, Apply Thin layer of Desitin  (Zinc Oxide) 2 times daily and as additional application needed for soiling.   magnesium oxide (MAG-OX) 400 MG tablet 1 tablet, Oral, Daily   nitrofurantoin (MACRODANTIN) 50 mg, Oral, Daily at bedtime   nystatin (MYCOSTATIN/NYSTOP) powder 1 Application, Topical, 3 times daily   olmesartan (BENICAR) 5 mg, Oral, Daily   omeprazole (PRILOSEC) 20 mg, Oral, Daily   ondansetron (ZOFRAN) 4 mg, Oral, Every 8 hours PRN   Ozempic (0.25 or 0.5 MG/DOSE) 0.25 mg, Subcutaneous, Weekly   rosuvastatin (CRESTOR) 10 mg, Oral, Daily   silodosin (RAPAFLO) 8 mg, Oral, Daily at bedtime   sodium  bicarbonate 650 mg, Oral, 2 times daily   sodium citrate-citric acid (ORACIT) 500-334 MG/5ML solution 15 mLs, Oral, 2 times daily   SPS, SODIUM POLYSTYRENE SULF, 15 GM/60ML suspension 15 mLs, Oral, 2 times weekly, Tuesdays and Thursdays   sulfamethoxazole-trimethoprim (BACTRIM DS) 800-160 MG tablet 1 tablet, Oral, 2 times daily   Vitamin D 2,000 Units, Oral, Daily     REVIEW OF SYSTEMS   Review of Systems - Oncology  All other pertinent review of systems is negative except as mentioned above in HPI  PHYSICAL EXAMINATION  ECOG PERFORMANCE STATUS: 4 - Bedbound  Vitals:   09/01/23 0619 09/01/23 1037  BP: 110/63 96/69  Pulse: 85 82  Resp: 12 15  Temp: 97.7 F (36.5 C) (!) 97.5 F (36.4 C)  SpO2: 98% 98%   Filed Weights   08/31/23 1444  Weight: 149 lb 14.6 oz (68 kg)    Physical Exam Constitutional:      General: He is not in acute distress. HENT:     Head: Normocephalic and atraumatic.  Eyes:     General: Scleral icterus present.     Conjunctiva/sclera: Conjunctivae normal.  Cardiovascular:     Rate and Rhythm: Normal rate and regular rhythm.     Heart sounds: Normal heart sounds.  Pulmonary:     Effort: Pulmonary effort is normal.     Breath sounds: Normal breath sounds.  Abdominal:     General: There is no distension.  Musculoskeletal:     Right lower leg: No edema.     Left lower leg: No edema.  Neurological:     Mental Status: He is alert and oriented to person, place, and time.     LABORATORY DATA:   I have reviewed the data as listed  Results for orders placed or performed during the hospital encounter of 08/31/23 (from the past 24 hours)  Osmolality   Collection Time: 08/31/23 11:21 PM  Result Value Ref Range   Osmolality 286 275 - 295 mOsm/kg  Glucose, capillary   Collection Time: 09/01/23 12:55 AM  Result Value Ref Range  Glucose-Capillary 70 70 - 99 mg/dL  Urinalysis, Routine w reflex microscopic -Urine, Clean Catch   Collection Time:  09/01/23  1:11 AM  Result Value Ref Range   Color, Urine AMBER (A) YELLOW   APPearance CLOUDY (A) CLEAR   Specific Gravity, Urine 1.018 1.005 - 1.030   pH 5.0 5.0 - 8.0   Glucose, UA NEGATIVE NEGATIVE mg/dL   Hgb urine dipstick SMALL (A) NEGATIVE   Bilirubin Urine NEGATIVE NEGATIVE   Ketones, ur NEGATIVE NEGATIVE mg/dL   Protein, ur 30 (A) NEGATIVE mg/dL   Nitrite NEGATIVE NEGATIVE   Leukocytes,Ua LARGE (A) NEGATIVE   RBC / HPF 6-10 0 - 5 RBC/hpf   WBC, UA >50 0 - 5 WBC/hpf   Bacteria, UA NONE SEEN NONE SEEN   Squamous Epithelial / HPF 0-5 0 - 5 /HPF   WBC Clumps PRESENT   Osmolality, urine   Collection Time: 09/01/23  1:11 AM  Result Value Ref Range   Osmolality, Ur 312 300 - 900 mOsm/kg  Sodium, urine, random   Collection Time: 09/01/23  1:11 AM  Result Value Ref Range   Sodium, Ur 49 mmol/L  Basic metabolic panel   Collection Time: 09/01/23  1:47 AM  Result Value Ref Range   Sodium 129 (L) 135 - 145 mmol/L   Potassium 4.3 3.5 - 5.1 mmol/L   Chloride 100 98 - 111 mmol/L   CO2 18 (L) 22 - 32 mmol/L   Glucose, Bld 67 (L) 70 - 99 mg/dL   BUN 35 (H) 8 - 23 mg/dL   Creatinine, Ser 2.13 (H) 0.61 - 1.24 mg/dL   Calcium 9.1 8.9 - 08.6 mg/dL   GFR, Estimated 41 (L) >60 mL/min   Anion gap 11 5 - 15  CBC   Collection Time: 09/01/23  1:47 AM  Result Value Ref Range   WBC 10.1 4.0 - 10.5 K/uL   RBC 4.10 (L) 4.22 - 5.81 MIL/uL   Hemoglobin 10.5 (L) 13.0 - 17.0 g/dL   HCT 57.8 (L) 46.9 - 62.9 %   MCV 80.2 80.0 - 100.0 fL   MCH 25.6 (L) 26.0 - 34.0 pg   MCHC 31.9 30.0 - 36.0 g/dL   RDW 52.8 (H) 41.3 - 24.4 %   Platelets 159 150 - 400 K/uL   nRBC 0.0 0.0 - 0.2 %  Magnesium   Collection Time: 09/01/23  1:47 AM  Result Value Ref Range   Magnesium 2.2 1.7 - 2.4 mg/dL  Phosphorus   Collection Time: 09/01/23  1:47 AM  Result Value Ref Range   Phosphorus 3.2 2.5 - 4.6 mg/dL  Hepatic function panel   Collection Time: 09/01/23  1:47 AM  Result Value Ref Range   Total Protein  6.6 6.5 - 8.1 g/dL   Albumin 2.8 (L) 3.5 - 5.0 g/dL   AST 010 (H) 15 - 41 U/L   ALT 292 (H) 0 - 44 U/L   Alkaline Phosphatase 1,066 (H) 38 - 126 U/L   Total Bilirubin 10.2 (H) 0.0 - 1.2 mg/dL   Bilirubin, Direct 6.7 (H) 0.0 - 0.2 mg/dL   Indirect Bilirubin 3.5 (H) 0.3 - 0.9 mg/dL  Hemoglobin U7O   Collection Time: 09/01/23  1:47 AM  Result Value Ref Range   Hgb A1c MFr Bld 9.4 (H) 4.8 - 5.6 %   Mean Plasma Glucose 223.08 mg/dL  Ammonia   Collection Time: 09/01/23  1:47 AM  Result Value Ref Range   Ammonia 28 9 - 35 umol/L  Glucose, capillary   Collection Time: 09/01/23  2:13 AM  Result Value Ref Range   Glucose-Capillary 61 (L) 70 - 99 mg/dL  Glucose, capillary   Collection Time: 09/01/23  2:37 AM  Result Value Ref Range   Glucose-Capillary 141 (H) 70 - 99 mg/dL  Glucose, capillary   Collection Time: 09/01/23  4:04 AM  Result Value Ref Range   Glucose-Capillary 91 70 - 99 mg/dL  Glucose, capillary   Collection Time: 09/01/23  6:16 AM  Result Value Ref Range   Glucose-Capillary 62 (L) 70 - 99 mg/dL  Glucose, capillary   Collection Time: 09/01/23  6:43 AM  Result Value Ref Range   Glucose-Capillary 124 (H) 70 - 99 mg/dL  Glucose, capillary   Collection Time: 09/01/23 12:03 PM  Result Value Ref Range   Glucose-Capillary 65 (L) 70 - 99 mg/dL  Glucose, capillary   Collection Time: 09/01/23  1:25 PM  Result Value Ref Range   Glucose-Capillary 182 (H) 70 - 99 mg/dL  Glucose, capillary   Collection Time: 09/01/23  4:28 PM  Result Value Ref Range   Glucose-Capillary 219 (H) 70 - 99 mg/dL      RADIOGRAPHIC STUDIES:  I have personally reviewed the radiological images as listed and agree with the findings in the report.  CT ABDOMEN PELVIS W CONTRAST Result Date: 08/31/2023 CLINICAL DATA:  Metastatic pancreatic adenocarcinoma.  Jaundice. * Tracking Code: BO * EXAM: CT ABDOMEN AND PELVIS WITH CONTRAST TECHNIQUE: Multidetector CT imaging of the abdomen and pelvis was  performed using the standard protocol following bolus administration of intravenous contrast. RADIATION DOSE REDUCTION: This exam was performed according to the departmental dose-optimization program which includes automated exposure control, adjustment of the mA and/or kV according to patient size and/or use of iterative reconstruction technique. CONTRAST:  75mL OMNIPAQUE IOHEXOL 300 MG/ML  SOLN COMPARISON:  PET-CT 08/08/2023 FINDINGS: Lower chest: Coronary artery atheromatous vascular calcification. Small type 1 hiatal hernia. Mild atelectasis in both lower lobes. Hepatobiliary: Hypodense liver lesions compatible with biopsy-proven metastatic adenocarcinoma. Indistinctly marginated lesion inferiorly in the right hepatic lobe measures 2.6 by 1.8 cm on image 38 series 2. Multiple other lesions are present. Currently no there is no extrahepatic biliary dilatation, although mild intrahepatic biliary dilatation is suggested in segment 8. Gallbladder unremarkable. Pancreas: Infiltrative mass of the pancreatic body and adjacent tail, also extending around adjacent vascular structures including the splenic artery. Splenic vein poorly seen, presumably thrombosed. 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 The SMV appears patent below the level of the mass. Because of its infiltrative nature of the mass is difficult to measure but is approximately 5.5 by 4.8 cm on image 37 series 2. Spleen: Subtle splenomegaly. There is some mild heterogeneity medially in the spleen, although there is no abnormal hypermetabolic activity in this vicinity on prior PET-CT and this may be an incidental finding. Adrenals/Urinary Tract: Hazy infiltration from the pancreatic mass abuts the medial limb of the left adrenal gland. Foley catheter in place. Kidneys unremarkable. Stomach/Bowel: Redundant sigmoid colon. Borderline wall thickening in the rectum. No dilated bowel. Prominent stool throughout the colon  favors constipation. Vascular/Lymphatic: Atherosclerosis is present, including aortoiliac atherosclerotic disease. Reproductive: Unremarkable Other: Trace free pelvic fluid, nonspecific. Musculoskeletal: Mild levoconvex lumbar scoliosis. Lumbar spondylosis and degenerative disc disease. Bilateral degenerative hip arthropathy. IMPRESSION: 1. Infiltrative mass of the pancreatic body and adjacent tail, also extending around adjacent vascular structures including the splenic artery. Splenic vein poorly seen, presumably thrombosed. Very narrowed and  possibly occluded lower portal vein with some collaterals. The SMV appears patent below the level of the mass. 2. Multiple hypodense liver lesions compatible with biopsy-proven metastatic adenocarcinoma. Currently no there is no extrahepatic biliary dilatation, although mild intrahepatic biliary dilatation is suggested in segment 8. 3. Subtle splenomegaly. There is some mild heterogeneity medially in the spleen, although there is no abnormal hypermetabolic activity in this vicinity on prior PET-CT and this may be an incidental finding. 4. Prominent stool throughout the colon favors constipation. 5. Trace free pelvic fluid, nonspecific. 6. Small type 1 hiatal hernia. 7. Coronary artery atheromatous vascular calcification. 8. Mild levoconvex lumbar scoliosis. Lumbar spondylosis and degenerative disc disease. 9. Bilateral degenerative hip arthropathy. 10.  Aortic Atherosclerosis (ICD10-I70.0). Electronically Signed   By: Gaylyn Rong M.D.   On: 08/31/2023 19:14   US BIOPSY (LIVER) Result Date: 08/27/2023 INDICATION: hepatic lesions for diagnosis.  History of pancreatic cancer. EXAM: ULTRASOUND GUIDED LIVER MASS BIOPSY COMPARISON:  MRI abdomen, 07/24/2023. PET-CT, 08/08/2023. CT [facility], 06/26/2023. MEDICATIONS: None ANESTHESIA/SEDATION: Moderate (conscious) sedation was employed during this procedure. A total of Versed 0.5 mg and Fentanyl 50 mcg was administered  intravenously. Moderate Sedation Time: 17 minutes. The patient's level of consciousness and vital signs were monitored continuously by radiology nursing throughout the procedure under my direct supervision. COMPLICATIONS: None immediate. PROCEDURE: Informed written consent was obtained from the patient and/or patient's representative after a discussion of the risks, benefits and alternatives to treatment. The patient understands and consents the procedure. A timeout was performed prior to the initiation of the procedure. Ultrasound scanning was performed of the right upper abdominal quadrant demonstrates an ill-defined liver mass adjacent to the gallbladder fossa The RIGHT hepatic lobe mass was selected for biopsy and the procedure was planned. The right upper abdominal quadrant was prepped and draped in the usual sterile fashion. The overlying soft tissues were anesthetized with 1% lidocaine with epinephrine. A 17 gauge, 6.8 cm co-axial needle was advanced into a peripheral aspect of the lesion. This was followed by 2 core biopsies with an 18 gauge core device under direct ultrasound guidance. The needle was removed then superficial hemostasis was obtained with manual compression. Post procedural scanning was negative for definitive area of hemorrhage or additional complication. A dressing was placed. The patient tolerated the procedure well without immediate post procedural complication. IMPRESSION: Successful ultrasound guided core needle biopsy of liver mass. Roanna Banning, MD [facility] Electronically Signed   By: Roanna Banning M.D.   On: 08/27/2023 20:12   NM PET Image Initial (PI) Skull Base To Thigh Result Date: 08/16/2023 CLINICAL DATA:  Initial treatment strategy for pancreatic mass/adenocarcinoma. EXAM: NUCLEAR MEDICINE PET SKULL BASE TO THIGH TECHNIQUE: 7.9 mCi F-18 FDG was injected intravenously. Full-ring PET imaging was performed from the  skull base to thigh after the radiotracer. CT data was obtained and used for attenuation correction and anatomic localization. Fasting blood glucose: 184 mg/dl COMPARISON:  Multiple exams, including MRI 07/24/2023 and CT scan 06/26/2023 FINDINGS: Mediastinal blood pool activity: SUV max 3.0 Liver activity: SUV max NA NECK: No significant abnormal hypermetabolic activity in this region. Incidental CT findings: Bilateral common carotid atheromatous vascular calcifications. CHEST: INSERT skip chest Incidental CT findings: Coronary, aortic arch, and branch vessel atherosclerotic vascular disease. Mild atelectasis or scarring in the posterior basal segments of both lower lobes. ABDOMEN/PELVIS: Partially centrally necrotic infiltrative mass of the pancreatic body and adjacent tail measuring roughly about 5.9 by 4.0 cm with maximum SUV 6.5.  Numerous scattered hypermetabolic foci in the liver roughly corresponding to the regions of restricted diffusion in the liver shown on MRI of 07/24/2023. A lesion in the dome of the right hepatic lobe has a maximum SUV of 5.4 (background liver activity proximally 3.9). A caudate lobe lesion has a maximum SUV of 7.5. Numerous other scattered lesions are present in the liver. No hypermetabolic activity in the spleen to suggest that the splenic lesion seen on MRI represents a metastatic lesion. Scattered physiologic activity in bowel. Incidental CT findings: Atherosclerosis is present, including aortoiliac atherosclerotic disease. Scattered cluster bands of calcification along the buttocks, lateral thighs, and anterior abdominal wall, possibly confluent injection granuloma sites. Foley catheter noted. SKELETON: No significant abnormal hypermetabolic activity in this region. Incidental CT findings: Mild levoconvex lumbar scoliosis with rotary component. IMPRESSION: 1. Partially centrally necrotic infiltrative mass of the pancreatic body and adjacent tail measuring roughly about 5.9 by 4.0  cm with maximum SUV 6.5. 2. Numerous scattered hypermetabolic foci in the liver roughly corresponding to the regions of restricted diffusion in the liver shown on MRI of 07/24/2023, compatible with hepatic metastatic disease. 3. No hypermetabolic activity in the spleen to suggest that the splenic lesion seen on MRI represents a metastatic lesion. 4. Mild atelectasis or scarring in the posterior basal segments of both lower lobes. 5. Mild levoconvex lumbar scoliosis with rotary component. 6. Aortic atherosclerosis. Electronically Signed   By: Gaylyn Rong M.D.   On: 08/16/2023 09:44     This document was completed utilizing speech recognition software. Grammatical errors, random word insertions, pronoun errors, and incomplete sentences are an occasional consequence of this system due to software limitations, ambient noise, and hardware issues. Any formal questions or concerns about the content, text or information contained within the body of this dictation should be directly addressed to the provider for clarification.

## 2023-09-02 DIAGNOSIS — C787 Secondary malignant neoplasm of liver and intrahepatic bile duct: Secondary | ICD-10-CM | POA: Diagnosis not present

## 2023-09-02 DIAGNOSIS — R52 Pain, unspecified: Secondary | ICD-10-CM

## 2023-09-02 DIAGNOSIS — R4589 Other symptoms and signs involving emotional state: Secondary | ICD-10-CM | POA: Diagnosis not present

## 2023-09-02 DIAGNOSIS — R17 Unspecified jaundice: Secondary | ICD-10-CM | POA: Diagnosis not present

## 2023-09-02 DIAGNOSIS — Z515 Encounter for palliative care: Secondary | ICD-10-CM | POA: Diagnosis not present

## 2023-09-02 DIAGNOSIS — C259 Malignant neoplasm of pancreas, unspecified: Secondary | ICD-10-CM | POA: Diagnosis not present

## 2023-09-02 LAB — GLUCOSE, CAPILLARY
Comment 1: 323206249
Comment 1: 323206249
Glucose-Capillary: 254 mg/dL — ABNORMAL HIGH (ref 70–99)
Glucose-Capillary: 401 mg/dL — ABNORMAL HIGH (ref 70–99)
Glucose-Capillary: 430 mg/dL — ABNORMAL HIGH (ref 70–99)
Glucose-Capillary: 546 mg/dL (ref 70–99)
Glucose-Capillary: 574 mg/dL (ref 70–99)

## 2023-09-02 MED ORDER — INSULIN GLARGINE-YFGN 100 UNIT/ML ~~LOC~~ SOLN
30.0000 [IU] | Freq: Every day | SUBCUTANEOUS | Status: DC
  Filled 2023-09-02: qty 0.3

## 2023-09-02 MED ORDER — GERHARDT'S BUTT CREAM
TOPICAL_CREAM | Freq: Three times a day (TID) | CUTANEOUS | Status: DC
  Administered 2023-09-02: 1 via TOPICAL
  Filled 2023-09-02: qty 60

## 2023-09-02 MED ORDER — INSULIN GLARGINE-YFGN 100 UNIT/ML ~~LOC~~ SOLN
15.0000 [IU] | Freq: Every day | SUBCUTANEOUS | Status: DC
  Administered 2023-09-02: 15 [IU] via SUBCUTANEOUS
  Filled 2023-09-02: qty 0.15

## 2023-09-02 MED ORDER — INSULIN ASPART 100 UNIT/ML IJ SOLN
12.0000 [IU] | Freq: Once | INTRAMUSCULAR | Status: AC
  Administered 2023-09-02: 12 [IU] via SUBCUTANEOUS

## 2023-09-02 MED ORDER — INSULIN ASPART 100 UNIT/ML IJ SOLN
9.0000 [IU] | Freq: Once | INTRAMUSCULAR | Status: AC
  Administered 2023-09-02: 9 [IU] via SUBCUTANEOUS

## 2023-09-02 MED ORDER — INSULIN ASPART 100 UNIT/ML IJ SOLN
5.0000 [IU] | Freq: Once | INTRAMUSCULAR | Status: AC
  Administered 2023-09-02: 5 [IU] via SUBCUTANEOUS

## 2023-09-02 NOTE — Progress Notes (Signed)
PROGRESS NOTE    Jacob Fowler  KGM:[id number] DOB: 11/23/1950 DOA: 08/31/2023 PCP: Bennie Pierini, FNP    Brief Narrative:   Jacob Fowler is a 73 y.o. 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  Few days prior, noted his urine has become darker.  After nurse noted that his skin has become discolored/jaundice was sent to the ED for further evaluation.  Also slightly more confused over the last few days and at baseline he is dependent on most ADLs (other than feeding), however typically he is oriented and interactive.  He has had decreased intake with associated nausea and vomiting.  He also has chronic abdominal pain which is ongoing and no worse than prior to biopsy.  Family denied any recent fevers, chills.  In the ED, temperature 97.6 F, HR 95, RR 15, BP 87/54, SpO2 94% on room air.  WBC 11.5, hemoglobin 10.8, platelet count 168.  Sodium 120, potassium 4.5, chloride 90, CO2 17, glucose 227, BUN 41, creatinine 2.18.  Alk phos 1207, AST 175, ALT 348, total bilirubin 10.2.  Lactic acid 1.3.  INR 1.1.  Urinalysis with large leukocytes, small hemoglobin, no bacteria, greater than 50 WBCs.  CT abdomen/pelvis with contrast with infiltrative mass pancreatic body and adjacent tail extending around adjacent vascular structures including splenic artery, multiple hypodense liver lesions compatible with biopsy-proven metastatic adenocarcinoma, no extrahepatic biliary dilation, mild heterogenicity medially in the spleen, prominent stool throughout colon consistent with constipation.  [facility] was consulted.  TRH consulted for admission and patient was transferred to [facility] for further evaluation and management.  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  Recent diagnosis of pancreatic adenocarcinoma via biopsy on 08/27/2023.  Previous bilirubin on 08/07/2023, 0.9 within normal limits.  CT abdomen/pelvis with noted infiltrative mass pancreatic body and adjacent tail around adjacent vascular structures and multiple hypodense liver lesions consistent with liver metastasis and no extrahepatic biliary dilation.  Evaluated by interventional radiology for consideration of percutaneous drainage, currently no biliary dilation to allow for drain placement and not amenable to IR intervention at this time.  Seen by [facility] gastroenterology, Dr. Lorenso Quarry who discussed with advanced endoscopist, Dr. Elnoria Howard; no role for endoscopic intervention at this time.  Given his significant decline in the setting of his recent pancreatic adenocarcinoma diagnosis with overall poor prognosis, palliative care was consulted and patient/family wishes for no further aggressive intervention and inquiry into home hospice. -- Palliative care, medical oncology following; appreciate assistance -- Ceftriaxone 2 g IV every 24 hours -- Metronidazole 500 mg IV every 12 hours -- Decadron 4 mg IV every 24 hours -- Pending home hospice and equipment to be set up, anticipate discharge home tomorrow  Hypotension, hypovolemic Hx hypertension Patient was hypotensive on admission, responded to IV fluids. -- Holding home isosorbide mononitrate, and olmesartan; and dissipate likely will discontinue on discharge  Acute metabolic encephalopathy Patient with a  few days of decreased interaction with family although awake and alert. On  initial evaluation he is mostly oriented other than the situation and to the month. Suspect this is metabolic from his liver injury. Also has borderline severe range hyponatremia which may contribute.  Ammonia level within normal limits.  Hypovolemic hyponatremia Sodium 120 on admission, likely secondary to severe dehydration in the setting of poor oral intake.  Supported with IV fluid hydration with improvement of sodium to 129.  Now family moving towards comfort measures. 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 Bicarb 17, AG 13. Lactate 1.3. Suspect prerenal with decreased intake. Has foley in place.  Supported with IV fluid hydration. -- Cr 2.1>1.73 -- Encourage increased oral intake, no further lab draws IV fluids  Multiple sclerosis with quadriparesis: Left buttock stage II pressure injury, POA Pressure Injury 08/31/23 Buttocks Left;Upper Stage 2 -  Partial thickness loss of dermis presenting as a shallow open injury with a red, pink wound bed without slough. (Active)  08/31/23 2231  Location: Buttocks  Location Orientation: Left;Upper  Staging: Stage 2 -  Partial thickness loss of dermis presenting as a shallow open injury with a red, pink wound bed without slough.  Wound Description (Comments):   Present on Admission: Yes     Pressure Injury Buttocks Right Stage 3 -  Full thickness tissue loss. Subcutaneous fat may be visible but bone, tendon or muscle are NOT exposed. (Active)     Location: Buttocks  Location Orientation: Right  Staging: Stage 3 -  Full thickness tissue loss. Subcutaneous fat may be visible but bone, tendon or muscle are NOT exposed.  Wound Description (Comments):   Present on Admission: Yes     Pressure Injury 08/31/23  Buttocks Right;Left Stage 1 -  Intact skin with non-blanchable redness of a localized area usually over a bony prominence. (Active)  08/31/23 2233  Location: Buttocks  Location Orientation: Right;Left  Staging: Stage 1 -  Intact skin with non-blanchable redness of a localized area usually over a bony prominence.  Wound Description (Comments):   Present on Admission: Yes  -- Continue frequent turning, offloading, supportive care  CAD:  -- Discontinue statin due to liver injury    Type II diabetes melitis, with hyperglycemia:  Home insulin regimen is glargine 65 units which she takes in the morning, Ozempic weekly. -- Discontinue basal insulin given hypoglycemic episode related to poor oral intake -- Sensitive SSI for coverage -- CBG before every meal/at bedtime -- discontinue Ozempic given his advanced pancreatic cancer and GI symptoms.   Hypothyroidism:  -- Continues levothyroxine 88 mcg p.o. daily  BPH:  -- Continue home finasteride 5 mg p.o. daily -- Hold home Silodosin.   Chronic Foley:  -- Routine Foley care  Chronic anemia, hx B12, IDA:  -- Continue home B12 injection monthly, home iron.   Mood d/o:  -- Continue home Xanax at bedtime prn    DVT prophylaxis: SCDs Start: 08/31/23 2256    Code Status: Limited: Do not attempt resuscitation (DNR) -DNR-LIMITED -Do Not Intubate/DNI  Family Communication:   Disposition Plan:  Level of care: Med-Surg Status is: Inpatient Remains inpatient appropriate because: Pending home hospice, anticipate discharge home tomorrow once equipment arrives    Consultants:  [facility] gastroenterology Interventional radiology Medical oncology Palliative care  Procedures:  None  Antimicrobials:  Ceftriaxone Metronidazole   Subjective: Patient seen examined bedside, resting calmly.  Multiple family members present.  No complaints this morning.  Patient referred for home hospice yesterday, family reports awaiting for equipment arrival  likely will be tomorrow with hospital bed and then will be stable for discharge home.  Appreciative of the care he has received in the hospital.  Appetite improved with steroids.  Patient denies headache, no chest pain, no shortness of breath, no abdominal pain.  No acute events overnight per nursing staff.   Objective: Vitals:   09/01/23 0619 09/01/23 1037 09/01/23 2138 09/02/23 0609  BP: 110/63 96/69 139/87 115/81  Pulse: 85 82 (!) 103 93  Resp: 12 15 20 16   Temp: 97.7 F (36.5 C) (!) 97.5 F (36.4 C) 98.7 F (37.1 C) 98.5 F (36.9 C)  TempSrc: Oral Axillary Oral Oral  SpO2: 98% 98% 96% 97%  Weight:      Height:        Intake/Output Summary (Last 24 hours) at 09/02/2023 1058 Last data filed at 09/02/2023 0600 Gross per 24 hour  Intake 379.32 ml  Output 3100 ml  Net -2720.68 ml   Filed Weights   08/31/23 1444  Weight: 68 kg    Examination:  Physical Exam: GEN: NAD, alert, confused, chronically ill in appearance HEENT: NCAT, PERRL, EOMI, + scleral icterus, dry mucous membranes PULM: CTAB w/o wheezes/crackles, normal respiratory effort, on room air CV: RRR w/o M/G/R GI: abd soft, NTND, + BS GU: Foley catheter noted MSK: no peripheral edema Integumentary: Jaundice skin    Data Reviewed: I have personally reviewed following labs and imaging studies  CBC: Recent Labs  Lab 08/27/23 1148 08/31/23 1501 09/01/23 0147  WBC 12.2* 11.5* 10.1  HGB 11.5* 10.8* 10.5*  HCT 33.8* 31.5* 32.9*  MCV 76.8* 76.5* 80.2  PLT 188 168 159   Basic Metabolic Panel: Recent Labs  Lab 08/31/23 1501 09/01/23 0147  NA 120* 129*  K 4.5 4.3  CL 90* 100  CO2 17* 18*  GLUCOSE 227* 67*  BUN 41* 35*  CREATININE 2.18* 1.73*  CALCIUM 9.2 9.1  MG  --  2.2  PHOS  --  3.2   GFR: Estimated Creatinine Clearance: 34.3 mL/min (A) (by C-G formula based on SCr of 1.73 mg/dL (H)). Liver Function Tests: Recent Labs  Lab 08/31/23 1501 09/01/23 0147  AST 175* 148*  ALT 348* 292*   ALKPHOS 1,207* 1,066*  BILITOT 10.2* 10.2*  PROT 7.3 6.6  ALBUMIN 3.8 2.8*   No results for input(s): "LIPASE", "AMYLASE" in the last 168 hours. Recent Labs  Lab 08/31/23 1508 09/01/23 0147  AMMONIA 57* 28   Coagulation Profile: Recent Labs  Lab 08/27/23 1148 08/31/23 1508  INR 1.1 1.1   Cardiac Enzymes: No results for input(s): "CKTOTAL", "CKMB", "CKMBINDEX", "TROPONINI" in the last 168 hours. BNP (last 3 results) No results for input(s): "PROBNP" in the last 8760 hours. HbA1C: Recent Labs    09/01/23 0147  HGBA1C 9.4*   CBG: Recent Labs  Lab 09/01/23 1203 09/01/23 1325 09/01/23 1628 09/01/23 2135 09/02/23 0729  GLUCAP 65* 182* 219* 300* 254*   Lipid Profile: No results for input(s): "CHOL", "HDL", "LDLCALC", "TRIG", "CHOLHDL", "LDLDIRECT" in the last 72 hours. Thyroid Function Tests: No results for input(s): "TSH", "T4TOTAL", "FREET4", "T3FREE", "THYROIDAB" in the last 72 hours. Anemia Panel: No results for input(s): "VITAMINB12", "FOLATE", "FERRITIN", "TIBC", "IRON", "RETICCTPCT" in the last 72 hours. Sepsis Labs: Recent Labs  Lab 08/31/23 1537  LATICACIDVEN 1.3    No results found for this or any previous visit (from the past 240 hours).  Radiology Studies: CT ABDOMEN PELVIS W CONTRAST Result Date: 08/31/2023 CLINICAL DATA:  Metastatic pancreatic adenocarcinoma.  Jaundice. * Tracking Code: BO * EXAM: CT ABDOMEN AND PELVIS WITH CONTRAST TECHNIQUE: Multidetector CT imaging of the abdomen and pelvis was performed using the standard protocol following bolus administration of intravenous contrast. RADIATION DOSE REDUCTION: This exam was performed according to the departmental dose-optimization program which includes automated exposure control, adjustment of the mA and/or kV according to patient size and/or use of iterative reconstruction technique. CONTRAST:  75mL OMNIPAQUE IOHEXOL 300 MG/ML  SOLN COMPARISON:  PET-CT 08/08/2023 FINDINGS: Lower chest:  Coronary artery atheromatous vascular calcification. Small type 1 hiatal hernia. Mild atelectasis in both lower lobes. Hepatobiliary: Hypodense liver lesions compatible with biopsy-proven metastatic adenocarcinoma. Indistinctly marginated lesion inferiorly in the right hepatic lobe measures 2.6 by 1.8 cm on image 38 series 2. Multiple other lesions are present. Currently no there is no extrahepatic biliary dilatation, although mild intrahepatic biliary dilatation is suggested in segment 8. Gallbladder unremarkable. Pancreas: Infiltrative mass of the pancreatic body and adjacent tail, also extending around adjacent vascular structures including the splenic artery. Splenic vein poorly seen, presumably thrombosed. 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 The SMV appears patent below the level of the mass. Because of its infiltrative nature of the mass is difficult to measure but is approximately 5.5 by 4.8 cm on image 37 series 2. Spleen: Subtle splenomegaly. There is some mild heterogeneity medially in the spleen, although there is no abnormal hypermetabolic activity in this vicinity on prior PET-CT and this may be an incidental finding. Adrenals/Urinary Tract: Hazy infiltration from the pancreatic mass abuts the medial limb of the left adrenal gland. Foley catheter in place. Kidneys unremarkable. Stomach/Bowel: Redundant sigmoid colon. Borderline wall thickening in the rectum. No dilated bowel. Prominent stool throughout the colon favors constipation. Vascular/Lymphatic: Atherosclerosis is present, including aortoiliac atherosclerotic disease. Reproductive: Unremarkable Other: Trace free pelvic fluid, nonspecific. Musculoskeletal: Mild levoconvex lumbar scoliosis. Lumbar spondylosis and degenerative disc disease. Bilateral degenerative hip arthropathy. IMPRESSION: 1. Infiltrative mass of the pancreatic body and adjacent tail, also extending around adjacent vascular structures  including the splenic artery. Splenic vein poorly seen, presumably thrombosed. 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 The SMV appears patent below the level of the mass. 2. Multiple hypodense liver lesions compatible with biopsy-proven metastatic adenocarcinoma. Currently no there is no extrahepatic biliary dilatation, although mild intrahepatic biliary dilatation is suggested in segment 8. 3. Subtle splenomegaly. There is some mild heterogeneity medially in the spleen, although there is no abnormal hypermetabolic activity in this vicinity on prior PET-CT and this may be an incidental finding. 4. Prominent stool throughout the colon favors constipation. 5. Trace free pelvic fluid, nonspecific. 6. Small type 1 hiatal hernia. 7. Coronary artery atheromatous vascular calcification. 8. Mild levoconvex lumbar scoliosis. Lumbar spondylosis and degenerative disc disease. 9. Bilateral degenerative hip arthropathy. 10.  Aortic Atherosclerosis (ICD10-I70.0). Electronically Signed   By: Gaylyn Rong M.D.   On: 08/31/2023 19:14        Scheduled Meds:  Chlorhexidine Gluconate Cloth  6 each Topical Daily   dexamethasone (DECADRON) injection  4 mg Intravenous Daily   ferrous sulfate  325 mg Oral Q breakfast   finasteride  5 mg Oral Daily   insulin aspart  0-5 Units Subcutaneous QHS   insulin aspart  0-9 Units Subcutaneous TID WC   levothyroxine  88 mcg Oral Daily   liver oil-zinc oxide   Topical Daily  pantoprazole  40 mg Oral Daily   sodium chloride flush  3 mL Intravenous Q12H   Continuous Infusions:  cefTRIAXone (ROCEPHIN)  IV 2 g (09/02/23 0206)   metronidazole 500 mg (09/02/23 0256)     LOS: 2 days    Time spent: 56 minutes spent on chart review, discussion with nursing staff, consultants, updating family and interview/physical exam; more than 50% of that time was spent in counseling and/or coordination of care.    Alvira Philips [place], DO [facility] Available via Epic secure chat 7am-7pm After these hours, please refer to coverage provider listed on [email or web address] 09/02/2023, 10:58 AM

## 2023-09-02 NOTE — Consult Note (Signed)
WOC Nurse Consult Note: Reason for Consult: sacral/buttock wounds Patient with end stage pancreatic and liver cancer, planning to DC to home with hospice Wound type: Stage 2 pressure Injuries x 2 with associated irritant contact dermatitis Pressure Injury POA: Yes Measurement: see nursing flow sheets Wound bed:100 pink, some areas of darker deep tissue injury; partial skin peeling associated with ICD  Drainage (amount, consistency, odor) none Periwound: intact Dressing procedure/placement/frequency: Add Gerhardt's for comfort Could benefit from air mattress for moisture management and comfort. Hospice can provide if needed in the home.   Re consult if needed, will not follow at this time. Thanks  Suki Crockett [employer], RN,CWOCN, CNS, CWON-AP [phone])

## 2023-09-02 NOTE — Progress Notes (Signed)
Daily Progress Note   Patient Name: Jacob Fowler       Date: 09/02/2023 DOB: 1951-02-25  Age: 73 y.o. MRN#: [id number] Attending Physician: [place], Eric J, DO Primary Care Physician: Bennie Pierini, FNP Admit Date: 08/31/2023 Length of Stay: 2 days  Reason for Consultation/Follow-up: Establishing goals of care  Subjective:   CC: Patient laying in bed awake this morning with family at bedside.  Following up regarding complex medical decision making.  Subjective:  Reviewed EMR prior to presenting to bedside.  Oncologist also able to meet with family yesterday.  Plan is for patient to go home with hospice support.  Presented to bedside to meet with patient.  Patient seen sitting up in bed awake interacting with family.  Patient's wife, daughter, and son present at bedside.  Able to follow-up about plan to get patient home with hospice when safely able to do so with equipment delivered.  Daughter helping to coordinate this.  Family voiced appreciation for addition of steroids as patient continues to have good appetite.  Noted patient can continue this orally when going home with hospice.  Spent time answering questions as able and providing emotional support be active listening.  Noted palliative medicine team and continue to follow along with patient's medical journey.  Objective:   Vital Signs:  BP 115/81 (BP Location: Right Arm)   Pulse 93   Temp 98.5 F (36.9 C) (Oral)   Resp 16   Ht 5\' 6"  (1.676 m)   Wt 68 kg   SpO2 97%   BMI 24.20 kg/m   Physical Exam: General: awake, pleasant, jaundiced, ill-appearing Cardiovascular: RRR Respiratory: no increased work of breathing noted, not in respiratory distress Skin: Jaundiced   Imaging: I personally reviewed recent imaging.   Assessment & Plan:   Assessment: Patient is a 73 year old male with past medical history of multiple sclerosis with quadriparesis, CAD, diabetes, hypertension, hypothyroidism, BPH, chronic  Foley, and recent diagnosis of metastatic pancreatic adenocarcinoma with disease to the liver who was admitted on 08/31/2023 for management of worsening jaundice. Since admission, patient has been receiving management for acute liver injury of predominantly cholestatic pattern, hypotension and hypovolemia, AKI, hyponatremia, and encephalopathy. GI and IR consulted regarding recommendations. Palliative medicine team consulted to assist with complex medical decision making.   Recommendations/Plan: # Complex medical decision making/goals of care:   -Patient and family coordinating to get patient home with hospice support to enjoy quality time at home in the setting of metastatic pancreatic cancer with worsening liver function.  TOC assisting with coordination of discharge planning.                -Discontinued interventions that could cause further pain such as lab work and imaging/interventions.  At family's request we have continued antibiotics and fluids to hopefully allow patient more quality time at home.                  Code Status: Limited: Do not attempt resuscitation (DNR) -DNR-LIMITED -Do Not Intubate/DNI   # Symptom management Patient is receiving these palliative interventions for symptom management with an intent to improve quality of life.                   -Pain/Dyspnea, acute in the setting of end-of-life care                Patient was not on medications for pain previously.                               -  Receiving oral oxycodone 2.5-5 mg every 6 hours as needed                               -Receiving IV Dilaudid 0.5 mg every 4 hours as needed breakthrough    -Continue IV dexamethasone 4 mg daily.  Can be transitioned over to oral medication at 2 mg daily at time of discharge.                 -Anxiety/agitation, in the setting of end-of-life care                               -Continue oral Ativan solution 0.25 mg every 4 hours as needed   # Psycho-social/Spiritual Support:  -  Support System: Wife, daughter   # Discharge Planning:  Home with Hospice  Discussed with: Patient, patient's family at bedside  Thank you for allowing the palliative care team to participate in the care [facility]  Alvester Morin, DO Palliative Care Provider PMT # [phone]  If patient remains symptomatic despite maximum doses, please call PMT at [phone] between 0700 and 1900. Outside of these hours, please call attending, as PMT does not have night coverage.  Personally spent 35 minutes in patient care including extensive chart review (labs, imaging, progress/consult notes, vital signs), medically appropraite exam, discussed with treatment team, education to patient, family, and staff, documenting clinical information, medication review and management, coordination of care, and available advanced directive documents.

## 2023-09-02 NOTE — TOC Progression Note (Addendum)
Transition of Care [facility]) - Progression Note    Patient Details  Name: Jacob Fowler MRN: [id number] Date of Birth: 12-31-1950  Transition of Care [facility]) CM/SW Contact  Adrian Prows, RN Phone Number: 09/02/2023, 1:40 PM  Clinical Narrative:    Burton Apley at 1259 for update on home hospice referral; spoke w/ Claris Pong; she will have on-call RN return call.  -1345- return call from Kendal Hymen, on call nurse; she says she spoke w/ pt's dtr and the only equipment needed is hospital bed; Kendal Hymen says the bed has been ordered w/ delivery planned for Monday 08/03/23; she says agency liaison will follow up w/ TOC tomorrow.  Expected Discharge Plan: Home w Hospice Care Barriers to Discharge: Continued Medical Work up  Expected Discharge Plan and Services   Discharge Planning Services: CM Consult Post Acute Care Choice: Hospice Living arrangements for the past 2 months: Single Family Home                                       Social Determinants of Health (SDOH) Interventions SDOH Screenings   Food Insecurity: No Food Insecurity (09/01/2023)  Housing: Low Risk  (09/01/2023)  Transportation Needs: No Transportation Needs (09/01/2023)  Utilities: Not At Risk (09/01/2023)  Alcohol Screen: Low Risk  (08/27/2023)  Depression (PHQ2-9): Low Risk  (08/27/2023)  Financial Resource Strain: Low Risk  (08/27/2023)  Physical Activity: Insufficiently Active (08/27/2023)  Social Connections: Moderately Isolated (08/31/2023)  Stress: No Stress Concern Present (08/27/2023)  Tobacco Use: High Risk (08/31/2023)  Health Literacy: Adequate Health Literacy (08/27/2023)    Readmission Risk Interventions    09/01/2023    5:06 PM  Readmission Risk Prevention Plan  Transportation Screening Complete  PCP or Specialist Appt within 3-5 Days Complete  HRI or Home Care Consult Complete  Social Work Consult for Recovery Care Planning/Counseling Complete  Palliative Care Screening Complete   Medication Review Oceanographer) Complete

## 2023-09-03 ENCOUNTER — Other Ambulatory Visit (HOSPITAL_COMMUNITY): Payer: Self-pay

## 2023-09-03 ENCOUNTER — Telehealth: Payer: Self-pay | Admitting: *Deleted

## 2023-09-03 ENCOUNTER — Inpatient Hospital Stay: Payer: PPO | Admitting: Oncology

## 2023-09-03 DIAGNOSIS — Z7189 Other specified counseling: Secondary | ICD-10-CM | POA: Diagnosis not present

## 2023-09-03 DIAGNOSIS — R17 Unspecified jaundice: Secondary | ICD-10-CM | POA: Diagnosis not present

## 2023-09-03 DIAGNOSIS — R4589 Other symptoms and signs involving emotional state: Secondary | ICD-10-CM | POA: Diagnosis not present

## 2023-09-03 DIAGNOSIS — Z515 Encounter for palliative care: Secondary | ICD-10-CM | POA: Diagnosis not present

## 2023-09-03 DIAGNOSIS — C259 Malignant neoplasm of pancreas, unspecified: Secondary | ICD-10-CM | POA: Diagnosis not present

## 2023-09-03 LAB — GLUCOSE, CAPILLARY: Glucose-Capillary: 250 mg/dL — ABNORMAL HIGH (ref 70–99)

## 2023-09-03 MED ORDER — ALPRAZOLAM 0.5 MG PO TABS
0.2500 mg | ORAL_TABLET | Freq: Three times a day (TID) | ORAL | 0 refills | Status: DC | PRN
  Filled 2023-09-03: qty 30, 20d supply, fill #0

## 2023-09-03 MED ORDER — INSULIN GLARGINE-YFGN 100 UNIT/ML ~~LOC~~ SOLN
50.0000 [IU] | Freq: Every day | SUBCUTANEOUS | Status: DC
  Administered 2023-09-03: 50 [IU] via SUBCUTANEOUS
  Filled 2023-09-03: qty 0.5

## 2023-09-03 MED ORDER — POLYETHYLENE GLYCOL 3350 17 GM/SCOOP PO POWD
17.0000 g | Freq: Every day | ORAL | 0 refills | Status: DC | PRN
  Filled 2023-09-03: qty 238, 14d supply, fill #0

## 2023-09-03 MED ORDER — INSULIN ASPART 100 UNIT/ML IJ SOLN
0.0000 [IU] | Freq: Three times a day (TID) | INTRAMUSCULAR | Status: DC
  Administered 2023-09-03: 7 [IU] via SUBCUTANEOUS

## 2023-09-03 MED ORDER — DEXAMETHASONE 4 MG PO TABS
4.0000 mg | ORAL_TABLET | Freq: Every day | ORAL | 0 refills | Status: DC
  Filled 2023-09-03: qty 30, 30d supply, fill #0

## 2023-09-03 MED ORDER — OXYCODONE HCL 5 MG PO TABS
5.0000 mg | ORAL_TABLET | Freq: Four times a day (QID) | ORAL | 0 refills | Status: DC | PRN
  Filled 2023-09-03: qty 20, 5d supply, fill #0

## 2023-09-03 MED ORDER — ONDANSETRON HCL 4 MG PO TABS
4.0000 mg | ORAL_TABLET | Freq: Three times a day (TID) | ORAL | 0 refills | Status: DC | PRN
  Filled 2023-09-03: qty 20, 20d supply, fill #0

## 2023-09-03 NOTE — Consult Note (Addendum)
[facility] Liaison Consult Note   09/03/2023  [name] Nov 07, 1950 [id number]  Insurance:  [employer]  Primary Care Provider: Bennie Pierini, FNP, with [facility]   [facility] Liaison remote coverage review for patient admitted Jacob Fowler      Chart reviewed  for active with [facility] RN CC prior to admission as listed in Care Teams , transitioning to Hospice Care with Home with Hospice.    Plan: Patient will have full care coordination services through Hospice and needs will be met at the hospice level of care. No planned follow up for transitional needs. Will sign off at transition from hospital. Will update [facility] RN of disposition via secure chat of disposition.  For questions,   .  Charlesetta Shanks, RN, BSN, CCM [employer], [facility] Liaison Direct Dial: [phone] or secure chat Email: [email or web address]

## 2023-09-03 NOTE — Discharge Summary (Signed)
Physician Discharge Summary  Jacob Fowler QQV:[id number] DOB: [birth year] DOA: 08/31/2023  PCP: Bennie Pierini, FNP  Admit date: 08/31/2023 Discharge date: 09/03/2023  Admitted From: Home Disposition: Home with hospice  Recommendations for Outpatient Follow-up:  Follow up with hospice provider on discharge  Home Health: No Equipment/Devices: Hospital bed  Discharge Condition: Stable CODE STATUS: DNR Diet recommendation: Comfort feeds as tolerates  History of present illness:  Jacob Fowler is a 73 y.o. 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  Few days prior, noted his urine has become darker.  After nurse noted that his skin has become discolored/jaundice was sent to the ED for further evaluation.  Also slightly more confused over the last few days and at baseline he is dependent on most ADLs (other than feeding), however typically he is oriented and interactive.  He has had decreased intake with associated nausea and vomiting.  He also has chronic abdominal pain which is ongoing and no worse than prior to biopsy.  Family denied any recent fevers, chills.   In the ED, temperature 97.6 F, HR 95, RR 15, BP 87/54, SpO2 94% on room air.  WBC 11.5, hemoglobin 10.8, platelet count 168.  Sodium 120, potassium 4.5, chloride 90, CO2 17, glucose 227, BUN 41, creatinine 2.18.  Alk phos 1207, AST 175, ALT 348, total bilirubin 10.2.  Lactic acid 1.3.  INR 1.1.  Urinalysis with large leukocytes, small hemoglobin, no bacteria, greater than 50 WBCs.  CT abdomen/pelvis with contrast with infiltrative mass pancreatic body and adjacent tail extending around adjacent vascular  structures including splenic artery, multiple hypodense liver lesions compatible with biopsy-proven metastatic adenocarcinoma, no extrahepatic biliary dilation, mild heterogenicity medially in the spleen, prominent stool throughout colon consistent with constipation.  [facility] was consulted.  TRH consulted for admission and patient was transferred to [facility] for further evaluation and management.  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  Recent diagnosis of pancreatic adenocarcinoma via biopsy on 08/27/2023.  Previous bilirubin on 08/07/2023, 0.9 within normal limits.  CT abdomen/pelvis with noted infiltrative mass pancreatic body and adjacent tail around adjacent vascular structures and multiple hypodense liver lesions consistent with liver metastasis and no extrahepatic biliary dilation.  Evaluated by interventional radiology for consideration of percutaneous drainage, currently no biliary dilation to allow for drain placement and not amenable to IR intervention at this time.  Seen by [facility] gastroenterology, Dr. Lorenso Quarry who discussed with advanced endoscopist, Dr. Elnoria Howard; no role for endoscopic intervention at this time.  Given his significant decline in the setting of his recent pancreatic adenocarcinoma diagnosis with overall poor prognosis, palliative care was consulted and patient/family wishes for no further aggressive intervention inquired into home hospice.  Palliative care and medical oncology also consulted and followed during hospital course.  Was started on Decadron 4 mg p.o. daily for symptom support.  Discharging with home  hospice.   Hypotension, hypovolemic Hx hypertension Patient was hypotensive on admission, responded to IV fluids.  Discontinued home isosorbide  mononitrate and olmesartan.   Acute metabolic encephalopathy Patient with a few days of decreased interaction with family although awake and alert. On initial evaluation he is mostly oriented other than the situation and to the month. Suspect this is metabolic from his liver injury. Also has borderline severe range hyponatremia which may contribute.  Ammonia level within normal limits.   Hypovolemic hyponatremia Sodium 120 on admission, likely secondary to severe dehydration in the setting of poor oral intake.  Supported with IV fluid hydration with improvement of sodium to 129.  Now transition to comfort measures and discharging to home hospice.   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 Bicarb 17, AG 13. Lactate 1.3. Suspect prerenal with decreased intake. Has foley in place.  Supported with IV fluid hydration.  Continue Foley catheter on discharge.   Multiple sclerosis with quadriparesis: Left buttock stage II pressure injury, POA Pressure Injury 08/31/23 Buttocks Left;Upper Stage 2 -  Partial thickness loss of dermis presenting as a shallow open injury with a red, pink wound bed without slough. (Active)  08/31/23 2231  Location: Buttocks  Location Orientation: Left;Upper  Staging: Stage 2 -  Partial thickness loss of dermis presenting as a shallow open injury with a red, pink wound bed without slough.  Wound Description (Comments):   Present on Admission: Yes     Pressure Injury Buttocks Right Stage 3 -  Full thickness tissue loss. Subcutaneous fat may be visible but bone, tendon or muscle are NOT exposed. (Active)     Location: Buttocks  Location Orientation: Right  Staging: Stage 3 -  Full thickness tissue loss. Subcutaneous fat may be visible but bone, tendon or  muscle are NOT exposed.  Wound Description (Comments):   Present on Admission: Yes     Pressure Injury 08/31/23 Buttocks Right;Left Stage 1 -  Intact skin with non-blanchable redness of a localized area usually over a bony prominence. (Active)  08/31/23 2233  Location: Buttocks  Location Orientation: Right;Left  Staging: Stage 1 -  Intact skin with non-blanchable redness of a localized area usually over a bony prominence.  Wound Description (Comments):   Present on Admission: Yes  -- Continue frequent turning, offloading, supportive care   CAD:  Discontinue statin due to liver injury     Type II diabetes melitis, with hyperglycemia:  Home insulin regimen is glargine 65 units which she takes in the morning, Ozempic weekly.   Hypothyroidism:  Continues levothyroxine 88 mcg p.o. daily   BPH:  Continue home finasteride 5 mg p.o. daily.  Foley catheter placed   Chronic Foley:  Routine Foley care   Chronic anemia, hx B12, IDA:  DC iron and B12 given transitioning to home hospice   Mood d/o:  Alprazolam 0.5 mg 3 times daily as needed for anxiety.  Discharge Diagnoses:  Principal Problem:   Jaundice Active Problems:   Hyponatremia   AKI (acute kidney injury) (HCC)   Intrahepatic cholestasis   Pressure injury of skin   Pancreatic adenocarcinoma (HCC)   Palliative care encounter   Goals of care, counseling/discussion   Agitation   Counseling and coordination of care   Need for emotional support   Malignant neoplasm metastatic to liver [facility])   DNR (do not resuscitate)   Pain    Discharge Instructions  Discharge Instructions     Call MD for:  difficulty breathing, headache or visual disturbances   Complete by: As directed    Call  MD for:  extreme fatigue   Complete by: As directed    Call MD for:  persistant dizziness or light-headedness   Complete by: As directed    Call MD for:  persistant nausea and vomiting   Complete by: As directed    Call MD for:  severe  uncontrolled pain   Complete by: As directed    Call MD for:  temperature >100.4   Complete by: As directed    Diet - low sodium heart healthy   Complete by: As directed    Increase activity slowly   Complete by: As directed    No wound care   Complete by: As directed       Allergies as of 09/03/2023       Reactions   Farxiga [dapagliflozin] Other (See Comments)   unable to take [place] due to recurrent UTIs, urinary retention and hydronephrosis   Kerendia [finerenone] Other (See Comments)   hyperkalemia        Medication List     STOP taking these medications    aspirin EC 81 MG tablet   cyanocobalamin 1000 MCG/ML injection Commonly known as: VITAMIN B12   ferrous sulfate 325 (65 FE) MG tablet   fosfomycin 3 g Pack Commonly known as: MONUROL   glucose blood test strip   isosorbide mononitrate 60 MG 24 hr tablet Commonly known as: IMDUR   nitrofurantoin 50 MG capsule Commonly known as: MACRODANTIN   nystatin powder Commonly known as: MYCOSTATIN/NYSTOP   olmesartan 5 MG tablet Commonly known as: BENICAR   Ozempic (0.25 or 0.5 MG/DOSE) 2 MG/3ML Sopn Generic drug: Semaglutide(0.25 or 0.5MG /DOS)   rosuvastatin 10 MG tablet Commonly known as: CRESTOR   silodosin 8 MG Caps capsule Commonly known as: RAPAFLO   sodium bicarbonate 650 MG tablet   sodium citrate-citric acid 500-334 MG/5ML solution Commonly known as: ORACIT   SPS (Sodium Polystyrene Sulf) 15 GM/60ML suspension Generic drug: sodium polystyrene   sulfamethoxazole-trimethoprim 800-160 MG tablet Commonly known as: Bactrim DS   VITAMIN C PO   Vitamin D 50 MCG (2000 UT) tablet       TAKE these medications    acetaminophen 325 MG tablet Commonly known as: TYLENOL Take 2 tablets (650 mg total) by mouth every 6 (six) hours as needed for mild pain (pain score 1-3) (or Fever >/= 101). What changed: how much to take   ALPRAZolam 0.5 MG tablet Commonly known as: XANAX Take 0.5 tablets  (0.25 mg total) by mouth 3 (three) times daily as needed for anxiety. What changed:  how much to take when to take this   dexamethasone 4 MG tablet Commonly known as: DECADRON Take 1 tablet (4 mg total) by mouth daily.   finasteride 5 MG tablet Commonly known as: PROSCAR Take 1 tablet (5 mg total) by mouth daily.   Insulin Pen Needle 31G X 5 MM Misc Use with lantus pen as directed   Lantus SoloStar 100 UNIT/ML Solostar Pen Generic drug: insulin glargine Inject 65 Units into the skin daily.   levothyroxine 88 MCG tablet Commonly known as: SYNTHROID Take 1 tablet (88 mcg total) by mouth daily.   liver oil-zinc oxide 40 % ointment Commonly known as: DESITIN Apply topically 2 (two) times daily. Apply Thin layer of Desitin  (Zinc Oxide) 2 times daily and as additional application needed for soiling. What changed:  how much to take when to take this reasons to take this additional instructions   magnesium oxide 400 MG tablet Commonly  known as: MAG-OX Take 1 tablet by mouth daily.   omeprazole 20 MG capsule Commonly known as: PRILOSEC Take 1 capsule (20 mg total) by mouth daily.   ondansetron 4 MG tablet Commonly known as: Zofran Take 1 tablet (4 mg total) by mouth every 8 (eight) hours as needed for nausea or vomiting.   oxyCODONE 5 MG immediate release tablet Commonly known as: Roxicodone Take 1 tablet (5 mg total) by mouth every 6 (six) hours as needed for moderate pain (pain score 4-6).   polyethylene glycol 17 g packet Commonly known as: MIRALAX / GLYCOLAX Take 17 g by mouth daily as needed for mild constipation.        Allergies  Allergen Reactions   Farxiga [Dapagliflozin] Other (See Comments)    unable to take Farxiga due to recurrent UTIs, urinary retention and hydronephrosis    Chauncey Mann [Finerenone] Other (See Comments)    hyperkalemia    Consultations: [facility] gastroenterology Interventional radiology Medical oncology Palliative  care   Procedures/Studies: CT ABDOMEN PELVIS W CONTRAST Result Date: 08/31/2023 CLINICAL DATA:  Metastatic pancreatic adenocarcinoma.  Jaundice. * Tracking Code: BO * EXAM: CT ABDOMEN AND PELVIS WITH CONTRAST TECHNIQUE: Multidetector CT imaging of the abdomen and pelvis was performed using the standard protocol following bolus administration of intravenous contrast. RADIATION DOSE REDUCTION: This exam was performed according to the departmental dose-optimization program which includes automated exposure control, adjustment of the mA and/or kV according to patient size and/or use of iterative reconstruction technique. CONTRAST:  75mL OMNIPAQUE IOHEXOL 300 MG/ML  SOLN COMPARISON:  PET-CT 08/08/2023 FINDINGS: Lower chest: Coronary artery atheromatous vascular calcification. Small type 1 hiatal hernia. Mild atelectasis in both lower lobes. Hepatobiliary: Hypodense liver lesions compatible with biopsy-proven metastatic adenocarcinoma. Indistinctly marginated lesion inferiorly in the right hepatic lobe measures 2.6 by 1.8 cm on image 38 series 2. Multiple other lesions are present. Currently no there is no extrahepatic biliary dilatation, although mild intrahepatic biliary dilatation is suggested in segment 8. Gallbladder unremarkable. Pancreas: Infiltrative mass of the pancreatic body and adjacent tail, also extending around adjacent vascular structures including the splenic artery. Splenic vein poorly seen, presumably thrombosed. 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 The SMV appears patent below the level of the mass. Because of its infiltrative nature of the mass is difficult to measure but is approximately 5.5 by 4.8 cm on image 37 series 2. Spleen: Subtle splenomegaly. There is some mild heterogeneity medially in the spleen, although there is no abnormal hypermetabolic activity in this vicinity on prior PET-CT and this may be an incidental finding. Adrenals/Urinary Tract:  Hazy infiltration from the pancreatic mass abuts the medial limb of the left adrenal gland. Foley catheter in place. Kidneys unremarkable. Stomach/Bowel: Redundant sigmoid colon. Borderline wall thickening in the rectum. No dilated bowel. Prominent stool throughout the colon favors constipation. Vascular/Lymphatic: Atherosclerosis is present, including aortoiliac atherosclerotic disease. Reproductive: Unremarkable Other: Trace free pelvic fluid, nonspecific. Musculoskeletal: Mild levoconvex lumbar scoliosis. Lumbar spondylosis and degenerative disc disease. Bilateral degenerative hip arthropathy. IMPRESSION: 1. Infiltrative mass of the pancreatic body and adjacent tail, also extending around adjacent vascular structures including the splenic artery. Splenic vein poorly seen, presumably thrombosed. 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 The SMV appears patent below the level of the mass. 2. Multiple hypodense liver lesions compatible with biopsy-proven metastatic adenocarcinoma. Currently no there is no extrahepatic biliary dilatation, although mild intrahepatic biliary dilatation is suggested in segment 8. 3. Subtle splenomegaly. There is  some mild heterogeneity medially in the spleen, although there is no abnormal hypermetabolic activity in this vicinity on prior PET-CT and this may be an incidental finding. 4. Prominent stool throughout the colon favors constipation. 5. Trace free pelvic fluid, nonspecific. 6. Small type 1 hiatal hernia. 7. Coronary artery atheromatous vascular calcification. 8. Mild levoconvex lumbar scoliosis. Lumbar spondylosis and degenerative disc disease. 9. Bilateral degenerative hip arthropathy. 10.  Aortic Atherosclerosis (ICD10-I70.0). Electronically Signed   By: Gaylyn Rong M.D.   On: 08/31/2023 19:14   US BIOPSY (LIVER) Result Date: 08/27/2023 INDICATION: hepatic lesions for diagnosis.  History of pancreatic cancer. EXAM: ULTRASOUND  GUIDED LIVER MASS BIOPSY COMPARISON:  MRI abdomen, 07/24/2023. PET-CT, 08/08/2023. CT AP, 06/26/2023. MEDICATIONS: None ANESTHESIA/SEDATION: Moderate (conscious) sedation was employed during this procedure. A total of Versed 0.5 mg and Fentanyl 50 mcg was administered intravenously. Moderate Sedation Time: 17 minutes. The patient's level of consciousness and vital signs were monitored continuously by radiology nursing throughout the procedure under my direct supervision. COMPLICATIONS: None immediate. PROCEDURE: Informed written consent was obtained from the patient and/or patient's representative after a discussion of the risks, benefits and alternatives to treatment. The patient understands and consents the procedure. A timeout was performed prior to the initiation of the procedure. Ultrasound scanning was performed of the right upper abdominal quadrant demonstrates an ill-defined liver mass adjacent to the gallbladder fossa The RIGHT hepatic lobe mass was selected for biopsy and the procedure was planned. The right upper abdominal quadrant was prepped and draped in the usual sterile fashion. The overlying soft tissues were anesthetized with 1% lidocaine with epinephrine. A 17 gauge, 6.8 cm co-axial needle was advanced into a peripheral aspect of the lesion. This was followed by 2 core biopsies with an 18 gauge core device under direct ultrasound guidance. The needle was removed then superficial hemostasis was obtained with manual compression. Post procedural scanning was negative for definitive area of hemorrhage or additional complication. A dressing was placed. The patient tolerated the procedure well without immediate post procedural complication. IMPRESSION: Successful ultrasound guided core needle biopsy of liver mass. Roanna Banning, MD [facility] Electronically Signed   By: Roanna Banning M.D.   On: 08/27/2023 20:12   NM PET Image Initial (PI) Skull  Base To Thigh Result Date: 08/16/2023 CLINICAL DATA:  Initial treatment strategy for pancreatic mass/adenocarcinoma. EXAM: NUCLEAR MEDICINE PET SKULL BASE TO THIGH TECHNIQUE: 7.9 mCi F-18 FDG was injected intravenously. Full-ring PET imaging was performed from the skull base to thigh after the radiotracer. CT data was obtained and used for attenuation correction and anatomic localization. Fasting blood glucose: 184 mg/dl COMPARISON:  Multiple exams, including MRI 07/24/2023 and CT scan 06/26/2023 FINDINGS: Mediastinal blood pool activity: SUV max 3.0 Liver activity: SUV max NA NECK: No significant abnormal hypermetabolic activity in this region. Incidental CT findings: Bilateral common carotid atheromatous vascular calcifications. CHEST: INSERT skip chest Incidental CT findings: Coronary, aortic arch, and branch vessel atherosclerotic vascular disease. Mild atelectasis or scarring in the posterior basal segments of both lower lobes. ABDOMEN/PELVIS: Partially centrally necrotic infiltrative mass of the pancreatic body and adjacent tail measuring roughly about 5.9 by 4.0 cm with maximum SUV 6.5. Numerous scattered hypermetabolic foci in the liver roughly corresponding to the regions of restricted diffusion in the liver shown on MRI of 07/24/2023. A lesion in the dome of the right hepatic lobe has a maximum SUV of 5.4 (background liver activity proximally 3.9). A caudate lobe lesion has a maximum  SUV of 7.5. Numerous other scattered lesions are present in the liver. No hypermetabolic activity in the spleen to suggest that the splenic lesion seen on MRI represents a metastatic lesion. Scattered physiologic activity in bowel. Incidental CT findings: Atherosclerosis is present, including aortoiliac atherosclerotic disease. Scattered cluster bands of calcification along the buttocks, lateral thighs, and anterior abdominal wall, possibly confluent injection granuloma sites. Foley catheter noted. SKELETON: No significant  abnormal hypermetabolic activity in this region. Incidental CT findings: Mild levoconvex lumbar scoliosis with rotary component. IMPRESSION: 1. Partially centrally necrotic infiltrative mass of the pancreatic body and adjacent tail measuring roughly about 5.9 by 4.0 cm with maximum SUV 6.5. 2. Numerous scattered hypermetabolic foci in the liver roughly corresponding to the regions of restricted diffusion in the liver shown on MRI of 07/24/2023, compatible with hepatic metastatic disease. 3. No hypermetabolic activity in the spleen to suggest that the splenic lesion seen on MRI represents a metastatic lesion. 4. Mild atelectasis or scarring in the posterior basal segments of both lower lobes. 5. Mild levoconvex lumbar scoliosis with rotary component. 6. Aortic atherosclerosis. Electronically Signed   By: Gaylyn Rong M.D.   On: 08/16/2023 09:44     Subjective: Patient seen examined at bedside, lying in bed.  In good spirits.  Finished eating breakfast.  Family members at bedside.  Awaiting arrival of hospital bed and discharging home with home hospice today.  Patient denies headache, no chest pain, no shortness of breath, no abdominal pain, no generalized pain elsewhere.  Appreciative all the care he is received in the hospital.  No other specific questions, concerns or complaints at this time.  No acute events overnight per nursing staff.  Discharge Exam: Vitals:   09/03/23 0118 09/03/23 0421  BP: (!) 110/46 (!) 154/104  Pulse:  88  Resp:  18  Temp:  (!) 97.5 F (36.4 C)  SpO2:  99%   Vitals:   09/02/23 1205 09/02/23 2009 09/03/23 0118 09/03/23 0421  BP: 111/80 110/68 (!) 110/46 (!) 154/104  Pulse: 94 (!) 101  88  Resp: 17 19  18   Temp: 98 F (36.7 C) 97.8 F (36.6 C)  (!) 97.5 F (36.4 C)  TempSrc: Oral Oral  Oral  SpO2: 98% 98%  99%  Weight:      Height:        Physical Exam: GEN: NAD, alert, confused, chronically ill in appearance HEENT: NCAT, PERRL, EOMI, + scleral  icterus, dry mucous membranes PULM: CTAB w/o wheezes/crackles, normal respiratory effort, on room air CV: RRR w/o M/G/R GI: abd soft, NTND, + BS GU: Foley catheter noted MSK: no peripheral edema Integumentary: Jaundice skin    The results of significant diagnostics from this hospitalization (including imaging, microbiology, ancillary and laboratory) are listed below for reference.     Microbiology: No results found for this or any previous visit (from the past 240 hours).   Labs: BNP (last 3 results) Recent Labs    06/26/23 1403  BNP 465.0*   Basic Metabolic Panel: Recent Labs  Lab 08/31/23 1501 09/01/23 0147  NA 120* 129*  K 4.5 4.3  CL 90* 100  CO2 17* 18*  GLUCOSE 227* 67*  BUN 41* 35*  CREATININE 2.18* 1.73*  CALCIUM 9.2 9.1  MG  --  2.2  PHOS  --  3.2   Liver Function Tests: Recent Labs  Lab 08/31/23 1501 09/01/23 0147  AST 175* 148*  ALT 348* 292*  ALKPHOS 1,207* 1,066*  BILITOT 10.2* 10.2*  PROT 7.3  6.6  ALBUMIN 3.8 2.8*   No results for input(s): "LIPASE", "AMYLASE" in the last 168 hours. Recent Labs  Lab 08/31/23 1508 09/01/23 0147  AMMONIA 57* 28   CBC: Recent Labs  Lab 08/27/23 1148 08/31/23 1501 09/01/23 0147  WBC 12.2* 11.5* 10.1  HGB 11.5* 10.8* 10.5*  HCT 33.8* 31.5* 32.9*  MCV 76.8* 76.5* 80.2  PLT 188 168 159   Cardiac Enzymes: No results for input(s): "CKTOTAL", "CKMB", "CKMBINDEX", "TROPONINI" in the last 168 hours. BNP: Invalid input(s): "POCBNP" CBG: Recent Labs  Lab 09/02/23 1201 09/02/23 1617 09/02/23 2103 09/02/23 2107 09/03/23 0734  GLUCAP 401* 430* 546* 574* 250*   D-Dimer No results for input(s): "DDIMER" in the last 72 hours. Hgb A1c Recent Labs    09/01/23 0147  HGBA1C 9.4*   Lipid Profile No results for input(s): "CHOL", "HDL", "LDLCALC", "TRIG", "CHOLHDL", "LDLDIRECT" in the last 72 hours. Thyroid function studies No results for input(s): "TSH", "T4TOTAL", "T3FREE", "THYROIDAB" in the last 72  hours.  Invalid input(s): "FREET3" Anemia work up No results for input(s): "VITAMINB12", "FOLATE", "FERRITIN", "TIBC", "IRON", "RETICCTPCT" in the last 72 hours. Urinalysis    Component Value Date/Time   COLORURINE AMBER (A) 09/01/2023 0111   APPEARANCEUR CLOUDY (A) 09/01/2023 0111   APPEARANCEUR Cloudy (A) 03/14/2023 1428   LABSPEC 1.018 09/01/2023 0111   PHURINE 5.0 09/01/2023 0111   GLUCOSEU NEGATIVE 09/01/2023 0111   HGBUR SMALL (A) 09/01/2023 0111   BILIRUBINUR NEGATIVE 09/01/2023 0111   BILIRUBINUR Negative 03/14/2023 1428   KETONESUR NEGATIVE 09/01/2023 0111   PROTEINUR 30 (A) 09/01/2023 0111   UROBILINOGEN negative 12/18/2014 1601   UROBILINOGEN 1.0 04/06/2010 0509   NITRITE NEGATIVE 09/01/2023 0111   LEUKOCYTESUR LARGE (A) 09/01/2023 0111   Sepsis Labs Recent Labs  Lab 08/27/23 1148 08/31/23 1501 09/01/23 0147  WBC 12.2* 11.5* 10.1   Microbiology No results found for this or any previous visit (from the past 240 hours).   Time coordinating discharge: Over 30 minutes  SIGNED:   Alvira Philips [place], DO  [facility] 09/03/2023, 10:25 AM

## 2023-09-03 NOTE — Progress Notes (Signed)
Daily Progress Note   Patient Name: Jacob Fowler       Date: 09/03/2023 DOB: 11/12/1950  Age: 73 y.o. MRN#: [id number] Attending Physician: [place], Eric J, DO Primary Care Physician: Bennie Pierini, FNP Admit Date: 08/31/2023 Length of Stay: 3 days  Reason for Consultation/Follow-up: Establishing goals of care  Subjective:   CC: Patient laying in bed awake this morning with family at bedside.  Following up regarding complex medical decision making.  Subjective:  Reviewed EMR prior to presenting to bedside.  TOC assisting with coordination to get patient home with [facility] home hospice today.  Presented to bedside to see patient.  Patient awake in bed and appears comfortable.  Family at bedside agreed plan is for patient to get home with hospice today.  They are looking forward to patient being able to enjoy time at home.  Patient continuing to have good appetite at this time and family hopeful for time at home with hospice support.  Spent time providing emotional support via active listening.  All questions answered at that time.  Thanked patient and family for allowing me to visit with them today.  Objective:   Vital Signs:  BP (!) 154/104 (BP Location: Right Arm)   Pulse 88   Temp (!) 97.5 F (36.4 C) (Oral)   Resp 18   Ht 5\' 6"  (1.676 m)   Wt 68 kg   SpO2 99%   BMI 24.20 kg/m   Physical Exam: General: awake, pleasant, jaundiced, ill-appearing Cardiovascular: RRR Respiratory: no increased work of breathing noted, not in respiratory distress Skin: Jaundiced   Imaging: I personally reviewed recent imaging.   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  Since admission, patient has been receiving management for acute liver injury of predominantly cholestatic pattern, hypotension and hypovolemia, AKI, hyponatremia, and encephalopathy. GI and IR consulted regarding recommendations. Palliative medicine team consulted to assist with complex medical decision making.   Recommendations/Plan: # Complex medical decision making/goals of care:   -Patient and family coordinating to get patient home with hospice support to enjoy quality time at home in the setting of metastatic pancreatic cancer with worsening liver function.  TOC assisting with coordination of discharge planning.                -Discontinued interventions that could cause further pain such as lab work and imaging/interventions.  At family's request we have continued antibiotics and fluids to hopefully allow patient more quality time at home.                  Code Status: Limited: Do not attempt resuscitation (DNR) -DNR-LIMITED -Do Not Intubate/DNI   # Symptom management Patient is receiving these palliative interventions for symptom management with an intent to improve quality of life.                   -Pain/Dyspnea, acute in the setting of end-of-life care                Patient was not on medications for pain previously.                               -Continue oral oxycodone 2.5-5 mg  every 6 hours as needed    -Changed to oral dexamethasone 4 mg to continue daily at home for discharge medication                 -Anxiety/agitation, in the setting of end-of-life care                               -Continue oral Ativan solution 0.25 mg every 4 hours as needed   # Psycho-social/Spiritual Support:  - Support System: Wife, daughter   # Discharge Planning:  Home with Hospice today  Discussed with: Patient, patient's family at bedside  Thank you for allowing the palliative care team to participate in the care [facility].  Alvester Morin, DO Palliative Care Provider PMT #  [phone]  If patient remains symptomatic despite maximum doses, please call PMT at [phone] between 0700 and 1900. Outside of these hours, please call attending, as PMT does not have night coverage.

## 2023-09-03 NOTE — Patient Outreach (Signed)
Care Management   Follow Up Note   09/03/2023 Name: Jacob Fowler MRN: [id number] DOB: 1951/05/02   Referred by: Bennie Pierini, FNP Reason for referral : Care Management (Collaboration Note)   Patient was not contacted during this encounter. RNCM received in basket message from hospital liaison. Patient will be discharging home under the care of [facility] for hospice. Care Plan has been closed at this time.   Goals Addressed             This Visit's Progress    COMPLETED: RNCM Care Management Expected Outcomes: Monitor, Self-Manage and Reduce Symptoms of: DM, HTN, HLD       Current Barriers:  Chronic Disease Management support and education needs related to HTN, HLD, and DMII   RNCM Clinical Goal(s):  Patient will verbalize basic understanding of  HTN, HLD, and DMII disease process and self health management plan as evidenced by verbal explanation, recognizing symptoms, lifestyle modifications attend all scheduled medical appointments: with primary care provider and specialist as evidenced by keeping all scheduled appointments demonstrate Improved and Ongoing adherence to prescribed treatment plan for HTN, HLD, and DMII as evidenced by consistent medication compliance, symptom monitoring, continued lifestyle changes continue to work with RN Care Manager to address care management and care coordination needs related to  HTN, HLD, and COPD as evidenced by adherence to CM Team Scheduled appointments through collaboration with RN Care manager, provider, and care team.   Interventions: Evaluation of current treatment plan related to  self management and patient's adherence to plan as established by provider   Diabetes Interventions:  (Status:  Goal on track:  Yes.) Long Term Goal Assessed patient's understanding of A1c goal: <7% Provided education to patient about basic DM disease process. RNCM reviewed goals for fasting glucose and diet plan, daughter verbalized  understanding. Education/support provided.  Reviewed medications with patient and discussed importance of medication adherence. Reports compliance with all medications. Counseled on importance of regular laboratory monitoring as prescribed. Labs  up to date. Discussed plans with patient for ongoing care management follow up and provided patient with direct contact information for care management team Provided patient with written educational materials related to hypo and hyperglycemia and importance of correct treatment. Denies any hypo/hyperglycemic events. Reviewed scheduled/upcoming provider appointments including: 10-29-23 with PCP Advised patient, providing education and rationale, to check cbg fasting daily and record, calling provider for findings outside established parameters. RNCM reviewed with Mitzi, fasting goal <130 & post prandial <180. Review of patient status, including review of consultants reports, relevant laboratory and other test results, and medications completed Screening for signs and symptoms of depression related to chronic disease state  Assessed social determinant of health barriers Lab Results  Component Value Date   HGBA1C 9.9 (H) 07/30/2023    Hyperlipidemia Interventions:  (Status:  Goal on track:  Yes.) Long Term Goal  Lab Results  Component Value Date   CHOL 103 07/30/2023   HDL 24 (L) 07/30/2023   LDLCALC 50 07/30/2023   TRIG 174 (H) 07/30/2023   CHOLHDL 4.3 07/30/2023     Medication review performed; medication list updated in electronic medical record.  Provider established cholesterol goals reviewed Counseled on importance of regular laboratory monitoring as prescribed Provided HLD educational materials Reviewed role and benefits of statin for ASCVD risk reduction. Reports compliance with Crestor. Discussed strategies to manage statin-induced myalgias. Denies myalgias. Reviewed importance of limiting foods high in cholesterol Reviewed exercise  goals and target of 150 minutes per week Screening for signs  and symptoms of depression related to chronic disease state Assessed social determinant of health barriers   Hypertension Interventions:  (Status:  Goal on track:  Yes.) Long Term Goal Last practice recorded BP readings:  BP Readings from Last 3 Encounters:  08/07/23 103/66  08/01/23 105/75  07/30/23 102/64   Most recent eGFR/CrCl:  Lab Results  Component Value Date   EGFR 37 (L) 07/30/2023    No components found for: "CRCL"  Evaluation of current treatment plan related to hypertension self management and patient's adherence to plan as established by provider. Does not check blood pressure regularly.  Provided education to patient re: stroke prevention, s/s of heart attack and stroke. Support and education provided. Reviewed medications with patient and discussed importance of compliance. Reports compliance with all medications. Counseled on adverse effects of illicit drug and excessive alcohol use in patients with high blood pressure. Denies illicit drug/excessive alcohol use. Counseled on the importance of exercise goals with target of 150 minutes per week. Modified exercise plan with pedal bike and weights. Discussed plans with patient for ongoing care management follow up and provided patient with direct contact information for care management team Advised patient, providing education and rationale, to monitor blood pressure daily and record, calling PCP for findings outside established parameters Reviewed scheduled/upcoming provider appointments including: 10-29-23 with PCP Advised patient to discuss new changes with provider Provided education on prescribed diet Low fat/Carb Mod. Education provided. Discussed complications of poorly controlled blood pressure such as heart disease, stroke, circulatory complications, vision complications, kidney impairment, sexual dysfunction Screening for signs and symptoms of depression  related to chronic disease state  Assessed social determinant of health barriers  Patient Goals/Self-Care Activities: Take all medications as prescribed Attend all scheduled provider appointments Call pharmacy for medication refills 3-7 days in advance of running out of medications Attend church or other social activities Perform all self care activities independently  Perform IADL's (shopping, preparing meals, housekeeping, managing finances) independently Call provider office for new concerns or questions  schedule appointment with eye doctor check blood sugar at prescribed times: once daily and when you have symptoms of low or high blood sugar check feet daily for cuts, sores or redness take the blood sugar log to all doctor visits fill half of plate with vegetables keep a food diary manage portion size keep feet up while sitting wash and dry feet carefully every day wear comfortable, cotton socks check blood pressure daily write blood pressure results in a log or diary take blood pressure log to all doctor appointments call doctor for signs and symptoms of high blood pressure take all medications exactly as prescribed  Follow Up Plan:  Telephone follow up appointment with care management team member scheduled for:  10-01-2023 at 1:45 pm           Follow Up Plan: No further follow up required  Larey Brick, BSN RN [facility]  [facility] RN Care Manager Direct Dial: [phone]  Fax: [phone]

## 2023-09-03 NOTE — TOC Transition Note (Addendum)
Transition of Care [facility]) - Discharge Note   Patient Details  Name: Jacob Fowler MRN: [id number] Date of Birth: Jan 28, 1951  Transition of [facility] CM/SW Contact:  Lanier Clam, RN Phone Number: 09/03/2023, 10:15 AM   Clinical Narrative:   d/c plan [facility] home w/hospice once dme delivered to home. Ensure DNR signed. dtr will call the floor once dme arrived between 10a-1p. PTAR forms in printer. nsg let me know when to call PTAR -11:39a PTAR called. No further CM needs.     Final next level of care: Home w Hospice Care Barriers to Discharge: No Barriers Identified   Patient Goals and CMS Choice Patient states their goals for this hospitalization and ongoing recovery are:: Home w/hospice CMS Medicare.gov Compare Post Acute Care list provided to:: Patient Represenative (must comment) (Mitzi dtr) Choice offered to / list presented to : Adult Children [employer] ownership interest in [facility].provided to:: Adult Children    Discharge Placement                Patient to be transferred to facility by: PTAR Name of family member notified: Mitzi(dtr) Patient and family notified of of transfer: 09/03/23  Discharge Plan and Services Additional resources added to the After Visit Summary for     Discharge Planning Services: CM Consult Post Acute Care Choice: Hospice                    HH Arranged: RN [facility] Agency: Other - See comment Gertie Exon) Date HH Agency Contacted: 09/03/23 Time HH Agency Contacted: 1011 Representative spoke with at [facility] Agency: Software engineer  Social Drivers of Health (SDOH) Interventions SDOH Screenings   Food Insecurity: No Food Insecurity (09/01/2023)  Housing: Low Risk  (09/01/2023)  Transportation Needs: No Transportation Needs (09/01/2023)  Utilities: Not At Risk (09/01/2023)  Alcohol Screen: Low Risk  (08/27/2023)  Depression (PHQ2-9): Low Risk  (08/27/2023)  Financial Resource Strain: Low Risk  (08/27/2023)  Physical Activity:  Insufficiently Active (08/27/2023)  Social Connections: Moderately Isolated (08/31/2023)  Stress: No Stress Concern Present (08/27/2023)  Tobacco Use: High Risk (08/31/2023)  Health Literacy: Adequate Health Literacy (08/27/2023)     Readmission Risk Interventions    09/01/2023    5:06 PM  Readmission Risk Prevention Plan  Transportation Screening Complete  PCP or Specialist Appt within 3-5 Days Complete  HRI or Home Care Consult Complete  Social Work Consult for Recovery Care Planning/Counseling Complete  Palliative Care Screening Complete  Medication Review Oceanographer) Complete

## 2023-09-05 ENCOUNTER — Encounter: Payer: Self-pay | Admitting: Nurse Practitioner

## 2023-09-11 ENCOUNTER — Encounter: Payer: Self-pay | Admitting: *Deleted

## 2023-09-14 ENCOUNTER — Ambulatory Visit: Payer: PPO | Admitting: Urology

## 2023-09-15 DEATH — deceased

## 2023-09-17 ENCOUNTER — Telehealth: Payer: Self-pay | Admitting: Neurology

## 2023-09-17 NOTE — Telephone Encounter (Signed)
 Will notify MD Sympathy card to be signed and mailed to patients wife

## 2023-09-17 NOTE — Telephone Encounter (Signed)
 Pt's wife called wanting to inform the office that the pt has passed away.

## 2023-10-01 ENCOUNTER — Other Ambulatory Visit: Payer: PPO | Admitting: *Deleted

## 2023-10-29 ENCOUNTER — Ambulatory Visit: Payer: PPO | Admitting: Nurse Practitioner

## 2023-11-05 ENCOUNTER — Ambulatory Visit: Payer: PPO | Admitting: Nurse Practitioner

## 2024-01-30 ENCOUNTER — Ambulatory Visit: Payer: PPO | Admitting: Urology
